# Patient Record
Sex: Male | Born: 1980 | Race: White | Hispanic: No | Marital: Single
Health system: Midwestern US, Community
[De-identification: ages and names within clinical notes are randomized; demographics above are authoritative.]

## PROBLEM LIST (undated history)

## (undated) DIAGNOSIS — Z87442 Personal history of urinary calculi: Secondary | ICD-10-CM

## (undated) DIAGNOSIS — I509 Heart failure, unspecified: Secondary | ICD-10-CM

## (undated) DIAGNOSIS — F1111 Opioid abuse, in remission: Secondary | ICD-10-CM

## (undated) DIAGNOSIS — N289 Disorder of kidney and ureter, unspecified: Secondary | ICD-10-CM

## (undated) DIAGNOSIS — F419 Anxiety disorder, unspecified: Secondary | ICD-10-CM

## (undated) DIAGNOSIS — Z205 Contact with and (suspected) exposure to viral hepatitis: Secondary | ICD-10-CM

## (undated) DIAGNOSIS — F32A Depression, unspecified: Secondary | ICD-10-CM

## (undated) DIAGNOSIS — F1910 Other psychoactive substance abuse, uncomplicated: Secondary | ICD-10-CM

## (undated) DIAGNOSIS — I10 Essential (primary) hypertension: Secondary | ICD-10-CM

## (undated) DIAGNOSIS — I639 Cerebral infarction, unspecified: Secondary | ICD-10-CM

## (undated) DIAGNOSIS — I219 Acute myocardial infarction, unspecified: Secondary | ICD-10-CM

## (undated) DIAGNOSIS — B1920 Unspecified viral hepatitis C without hepatic coma: Secondary | ICD-10-CM

## (undated) DIAGNOSIS — J45909 Unspecified asthma, uncomplicated: Secondary | ICD-10-CM

## (undated) HISTORY — PX: APPENDECTOMY: SHX54

## (undated) HISTORY — PX: EYE SURGERY: SHX253

## (undated) HISTORY — PX: TIBIA FRACTURE SURGERY: SHX806

---

## 2010-11-13 ENCOUNTER — Emergency Department (HOSPITAL_COMMUNITY): Payer: PRIVATE HEALTH INSURANCE

## 2010-11-13 ENCOUNTER — Encounter (HOSPITAL_COMMUNITY): Payer: Self-pay | Admitting: Radiology

## 2010-11-13 ENCOUNTER — Observation Stay (HOSPITAL_COMMUNITY)
Admission: EM | Admit: 2010-11-13 | Discharge: 2010-11-14 | Disposition: A | Discharge status: Home / Self Care | Payer: PRIVATE HEALTH INSURANCE | Attending: General Surgery | Admitting: General Surgery

## 2010-11-13 DIAGNOSIS — K3580 Unspecified acute appendicitis: Principal | ICD-10-CM | POA: Insufficient documentation

## 2010-11-13 DIAGNOSIS — R5381 Other malaise: Secondary | ICD-10-CM | POA: Insufficient documentation

## 2010-11-13 DIAGNOSIS — R109 Unspecified abdominal pain: Secondary | ICD-10-CM | POA: Insufficient documentation

## 2010-11-13 DIAGNOSIS — R110 Nausea: Secondary | ICD-10-CM | POA: Insufficient documentation

## 2010-11-13 DIAGNOSIS — R5383 Other fatigue: Secondary | ICD-10-CM | POA: Insufficient documentation

## 2010-11-13 LAB — URINALYSIS, ROUTINE W REFLEX MICROSCOPIC
Hgb urine dipstick: NEGATIVE
Nitrite: NEGATIVE
Specific Gravity, Urine: 1.01 (ref 1.005–1.030)
Urobilinogen, UA: 0.2 mg/dL (ref 0.0–1.0)

## 2010-11-13 LAB — BASIC METABOLIC PANEL
CO2: 27 mEq/L (ref 19–32)
Calcium: 9.6 mg/dL (ref 8.4–10.5)
GFR calc Af Amer: >60 mL/min (ref >60)
GFR calc non Af Amer: >60 mL/min (ref >60)
Sodium: 137 mEq/L (ref 135–145)

## 2010-11-13 LAB — CBC
HCT: 41.8 % (ref 39.0–52.0)
RDW: 13.7 % (ref 11.5–15.5)
WBC: 12.5 10*3/uL — ABNORMAL HIGH (ref 4.0–10.5)

## 2010-11-13 LAB — DIFFERENTIAL
Basophils Absolute: 0 10*3/uL (ref 0.0–0.1)
Eosinophils Relative: 1 % (ref 0–5)
Lymphocytes Relative: 16 % (ref 12–46)
Neutro Abs: 8.9 10*3/uL — ABNORMAL HIGH (ref 1.7–7.7)

## 2010-11-13 MED ORDER — IOHEXOL 300 MG/ML  SOLN
100.0000 mL | Freq: Once | INTRAMUSCULAR | Status: AC | PRN
  Administered 2010-11-13: 100 mL via INTRAVENOUS

## 2010-11-14 ENCOUNTER — Other Ambulatory Visit: Payer: Self-pay | Admitting: General Surgery

## 2010-11-15 LAB — CROSSMATCH
ABO/RH(D): A POS
Antibody Screen: NEGATIVE
Unit division: 0

## 2010-11-18 NOTE — Discharge Summary (Signed)
  NAMEGAMBLE, ENDERLE                ACCOUNT NO.:  [id number]  MEDICAL RECORD NO.:  [id number]           PATIENT TYPE:  I  LOCATION:  A337                          FACILITY:  [facility]  PHYSICIAN:  Dalia Heading, M.D.  DATE OF BIRTH:  [birth year]  DATE OF ADMISSION:  11/13/2010 DATE OF DISCHARGE:  02/08/2012LH                              DISCHARGE SUMMARY   HOSPITAL COURSE SUMMARY:  The patient is a 30 year old white male who presented from the corrections institution in [place] with right lower quadrant abdominal pain x24 hours.  CT scan of the abdomen and pelvis revealed acute appendicitis.  Surgery consultation was obtained, and the patient was taken to the operating room on November 14, 2010, and underwent laparoscopic appendectomy.  He tolerated the procedure well. His postoperative course was unremarkable.  His diet was advanced without difficulty.  The patient is being discharged back to the corrections institution in good and stable condition.  DISCHARGE INSTRUCTIONS:  The patient is to follow up with Dr. Franky Macho on November 20, 2010.  DISCHARGE MEDICATIONS:  Percocet 1 to 2 tablets p.o. q.4 h. p.r.n. pain.  PRINCIPAL DIAGNOSIS:  Acute appendicitis.  PRINCIPAL PROCEDURE:  Laparoscopic appendectomy on November 14, 2010.     Dalia Heading, M.D.     MAJ/MEDQ  D:  11/14/2010  T:  11/15/2010  Job:  [id number]  Electronically Signed by Franky Macho M.D. on 11/16/2010 01:24:49 PM

## 2010-11-18 NOTE — Op Note (Signed)
  NAMEOMERO, KOWAL                ACCOUNT NO.:  [id number]  MEDICAL RECORD NO.:  [id number]           PATIENT TYPE:  I  LOCATION:  A337                          FACILITY:  [facility]  PHYSICIAN:  Dalia Heading, M.D.  DATE OF BIRTH:  Feb 18, 1981  DATE OF PROCEDURE:  11/14/2010 DATE OF DISCHARGE:                              OPERATIVE REPORT   PREOPERATIVE DIAGNOSIS:  Acute appendicitis.  POSTOPERATIVE DIAGNOSIS:  Acute appendicitis.  PROCEDURE:  Laparoscopic appendectomy.  SURGEON:  Dalia Heading, MD  ANESTHESIA:  General endotracheal.  INDICATIONS:  The patient is a 30 year old incarcerated white male who is brought in by the [facility] Department for evaluation of right lower quadrant abdominal pain.  CT scan of the abdomen and pelvis reveals acute appendicitis.  The patient now comes to the operating room for laparoscopic appendectomy.  The risks and benefits of the procedure including bleeding, infection, and thepossibility of an open procedure were fully explained to the patient, gave informed consent.  PROCEDURE NOTE:  The patient was placed in supine position.  After induction of general endotracheal anesthesia, the abdomen was prepped and draped using the usual sterile technique with DuraPrep.  Surgical site confirmation was performed.  A supraumbilical incision was made down to the fascia.  A Veress needle was introduced into the abdominal cavity and confirmation of placement was done using the saline drop test.  The abdomen was then insufflated to 16 mmHg pressure.  An 11-mm trocar was introduced into the abdominal cavity under direct visualization without difficulty.  The patient was placed in deeper Trendelenburg position.  An additional 12-mm trocar was placed in the suprapubic region and a 5-mm trocar was placed in the left lower quadrant region.  The appendix was visualized and was noted to be inflamed along its distal two-thirds.  The  mesoappendix was divided using the harmonic scalpel.  A vascular Endo-GIA was placed across the base of the appendix and fired.  The appendix was then removed using an EndoCatch bag without difficulty.  It was sent to Pathology for further examination.  The staple line was inspected and noted to be within normal limits.  All fluid and air were then evacuated from the abdominal cavity prior to the removal of the trocars.  All wounds were irrigated with normal saline.  All wounds were injected with 0.5% Sensorcaine.  The supraumbilical fascia as well as suprapubic fascia were reapproximated using 0 Vicryl interrupted sutures.  All skin incisions were closed using staples.  Betadine ointment and dry sterile dressings were applied.  All tape and needle counts were correct at the end of the procedure. The patient was extubated in the operating room and went back to recovery room awake in stable condition.  COMPLICATIONS:  None.  SPECIMEN:  Appendix.  BLOOD LOSS:  Minimal.     Dalia Heading, M.D.     MAJ/MEDQ  D:  11/14/2010  T:  11/14/2010  Job:  [id number]  Electronically Signed by Franky Macho M.D. on 11/16/2010 01:24:45 PM

## 2012-05-11 ENCOUNTER — Encounter (HOSPITAL_COMMUNITY): Payer: Self-pay | Admitting: *Deleted

## 2012-05-11 ENCOUNTER — Emergency Department (HOSPITAL_COMMUNITY)
Admission: EM | Admit: 2012-05-11 | Discharge: 2012-05-11 | Disposition: A | Discharge status: Home / Self Care | Payer: Self-pay | Attending: Emergency Medicine | Admitting: Emergency Medicine

## 2012-05-11 DIAGNOSIS — Z87891 Personal history of nicotine dependence: Secondary | ICD-10-CM | POA: Insufficient documentation

## 2012-05-11 DIAGNOSIS — J019 Acute sinusitis, unspecified: Secondary | ICD-10-CM | POA: Insufficient documentation

## 2012-05-11 DIAGNOSIS — N289 Disorder of kidney and ureter, unspecified: Secondary | ICD-10-CM | POA: Insufficient documentation

## 2012-05-11 DIAGNOSIS — B1920 Unspecified viral hepatitis C without hepatic coma: Secondary | ICD-10-CM | POA: Insufficient documentation

## 2012-05-11 DIAGNOSIS — J3489 Other specified disorders of nose and nasal sinuses: Secondary | ICD-10-CM | POA: Insufficient documentation

## 2012-05-11 HISTORY — DX: Disorder of kidney and ureter, unspecified: N28.9

## 2012-05-11 HISTORY — DX: Unspecified viral hepatitis C without hepatic coma: B19.20

## 2012-05-11 MED ORDER — OXYMETAZOLINE HCL 0.05 % NA SOLN
1.0000 | Freq: Once | NASAL | Status: AC
  Administered 2012-05-11: 1 via NASAL
  Filled 2012-05-11: qty 15

## 2012-05-11 NOTE — ED Notes (Signed)
Pt c/o L nasal congestion/pain x 2 days and sore throat with difficulty swallowing x since this am.  C/o brown colored mucus.

## 2012-05-11 NOTE — ED Provider Notes (Signed)
History     CSN: [id number]  Arrival date & time 05/11/12  4098   First MD Initiated Contact with Patient 05/11/12 [phone]      Chief Complaint  Patient presents with  . Sore Throat  . Facial Pain    (Consider location/radiation/quality/duration/timing/severity/associated sxs/prior treatment) HPI Comments: Rual Vermeer 31 y.o. male   The chief complaint is: Patient presents with:   Sore Throat   Facial Pain   The patient has medical history significant for:   Past Medical History:   Hepatitis C                                                  Renal disorder                                                 Comment:kidney stones  Patient presents with a 2 day history of sinus congestion, brown-colored rhinorrhea, sore throat (6/10) and mild dysphagia that he noticed this morning.Patient states he also a non-productive cough, diffuse headache, and left nostril that is very painful. He has tried OTC alka seltser, Dayquil, and Nightquil without relief. He states that he has no had any symptoms like this before. Denies fever, chills, night sweats, Denies tinnitus or otalgia. Denies SOB, pleuritic pain. Denies difficulty handling oral secretions. Denies NVD. Denies sick contacts.      Patient is a 31 y.o. male presenting with pharyngitis. The history is provided by the patient.  Sore Throat Associated symptoms include congestion, coughing and a sore throat. Pertinent negatives include no abdominal pain, chills, fever, nausea or vomiting.    Past Medical History  Diagnosis Date  . Hepatitis C   . Renal disorder     kidney stones    Past Surgical History  Procedure Date  . Appendectomy     No family history on file.  History  Substance Use Topics  . Smoking status: Former Games developer  . Smokeless tobacco: Not on file  . Alcohol Use: No      Review of Systems  Constitutional: Negative for fever, chills and activity change.  HENT: Positive for congestion, sore throat,  rhinorrhea and sinus pressure. Negative for ear pain, nosebleeds and tinnitus.   Eyes:       Eye puffiness  Respiratory: Positive for cough. Negative for shortness of breath and wheezing.   Gastrointestinal: Negative for nausea, vomiting, abdominal pain and constipation.    Allergies  Red dye  Home Medications   Current Outpatient Rx  Name Route Sig Dispense Refill  . ALKA-SELTZER PLS SINUS & COUGH PO Oral Take 2 tablets by mouth every 8 (eight) hours as needed. For cold/sinus symptoms    . DAYQUIL PO Oral Take 2 tablets by mouth every 12 (twelve) hours as needed. for cold/sinus symptoms      BP 142/87  Pulse 93  Temp 97.9 F (36.6 C) (Oral)  Resp 18  Physical Exam  Nursing note and vitals reviewed. Constitutional: He appears well-developed and well-nourished. No distress.  HENT:  Head: Normocephalic and atraumatic. No trismus in the jaw.  Right Ear: Tympanic membrane and external ear normal.  Left Ear: Tympanic membrane, external ear and ear canal normal.  Nose: Mucosal edema,  rhinorrhea and sinus tenderness present. No nose lacerations, septal deviation or nasal septal hematoma. No epistaxis.  No foreign bodies. Right sinus exhibits no maxillary sinus tenderness and no frontal sinus tenderness. Left sinus exhibits no maxillary sinus tenderness and no frontal sinus tenderness.    Mouth/Throat: Mucous membranes are normal. He does not have dentures. No oral lesions. Normal dentition. No dental abscesses, uvula swelling, lacerations or dental caries. Posterior oropharyngeal erythema present. No oropharyngeal exudate, posterior oropharyngeal edema or tonsillar abscesses.       Oropharynx markedly erythematous.  Eyes: Conjunctivae and EOM are normal. Pupils are equal, round, and reactive to light. Right eye exhibits no discharge. Left eye exhibits no discharge. No scleral icterus.  Neck: Normal range of motion. Neck supple.  Cardiovascular: Normal rate, regular rhythm and normal  heart sounds.   Pulmonary/Chest: Effort normal and breath sounds normal. He has no wheezes.  Abdominal: Soft. Bowel sounds are normal. There is no tenderness.  Lymphadenopathy:    He has no cervical adenopathy.  Neurological: He is alert.  Skin: Skin is warm and dry.    ED Course  Procedures (including critical care time)   Labs Reviewed  RAPID STREP SCREEN   No results found. Results for orders placed during the hospital encounter of 05/11/12  RAPID STREP SCREEN      Component Value Range   Streptococcus, Group A Screen (Direct) NEGATIVE  NEGATIVE     1. Acute sinusitis       MDM  Patient presents with 2 day history of sore throat, rhinorhea, non-productive cough, headache, left sinus pressure, and mild dysphagia..Denies fever, chills, NS. Denies SOB or wheezing. Strep screen negative Patient unresponsive to OTC treatments. Afrin given in the ER with some improvement of symptoms. Patient has no red flags for Pneumonia or other serious etiology. Patient will take Afrin home and recommended to take Guaifenesin OTC for supportive care. Patient given return precautions verbally.        Pixie Casino, PA-C 05/11/12 [phone]

## 2012-05-14 NOTE — ED Provider Notes (Signed)
Medical screening examination/treatment/procedure(s) were performed by non-physician practitioner and as supervising physician I was immediately available for consultation/collaboration.   Gwyneth Sprout, MD 05/14/12 [phone]

## 2012-05-22 ENCOUNTER — Encounter (HOSPITAL_COMMUNITY): Payer: Self-pay | Admitting: Emergency Medicine

## 2012-05-22 ENCOUNTER — Emergency Department (INDEPENDENT_AMBULATORY_CARE_PROVIDER_SITE_OTHER)
Admission: EM | Admit: 2012-05-22 | Discharge: 2012-05-22 | Disposition: A | Discharge status: Home / Self Care | Payer: Self-pay | Source: Home / Self Care

## 2012-05-22 DIAGNOSIS — R21 Rash and other nonspecific skin eruption: Secondary | ICD-10-CM

## 2012-05-22 DIAGNOSIS — B353 Tinea pedis: Secondary | ICD-10-CM

## 2012-05-22 DIAGNOSIS — H109 Unspecified conjunctivitis: Secondary | ICD-10-CM

## 2012-05-22 MED ORDER — HYDROCORTISONE 1 % EX CREA: TOPICAL_CREAM | CUTANEOUS | Status: DC

## 2012-05-22 MED ORDER — TRIAMCINOLONE ACETONIDE 40 MG/ML IJ SUSP
INTRAMUSCULAR | Status: AC
  Filled 2012-05-22: qty 5

## 2012-05-22 MED ORDER — CLOTRIMAZOLE-BETAMETHASONE 1-0.05 % EX CREA: TOPICAL_CREAM | CUTANEOUS | Status: DC

## 2012-05-22 MED ORDER — TETRACAINE HCL 0.5 % OP SOLN
OPHTHALMIC | Status: AC
  Filled 2012-05-22: qty 2

## 2012-05-22 MED ORDER — POLYMYXIN B-TRIMETHOPRIM 10000-0.1 UNIT/ML-% OP SOLN: 1.0000 [drp] | OPHTHALMIC | Status: DC

## 2012-05-22 MED ORDER — TRIAMCINOLONE ACETONIDE 40 MG/ML IJ SUSP
60.0000 mg | Freq: Once | INTRAMUSCULAR | Status: AC
  Administered 2012-05-22: 60 mg via INTRAMUSCULAR

## 2012-05-22 NOTE — ED Provider Notes (Signed)
History     CSN: [id number]  Arrival date & time 05/22/12  1231   None     Chief Complaint  Patient presents with  . Foot Pain    (Consider location/radiation/quality/duration/timing/severity/associated sxs/prior treatment) Patient is a 31 y.o. male presenting with lower extremity pain. The history is provided by the patient.  Foot Pain  #1 Patient reports right eye redness for 4 days associated with blurred vision.  No known injury, no known foreign bodies.  Has used OTC allergy eye drops with no improvement.  Denies previous history of same.  Not diabetic, no previous eye surgery/trauma, denies cataracts or glaucoma. No floaters or flashing lights No vision loss + blurred vision + eye discomfort + eyelid itching No tearing No headache/scalp tenderness Does not wear contacts or glasses.   #2  This patient complains of a pruritic rash.  Location: face, neck and upper body  Onset: 2 wk ago   Course: unchanged Self-treated with: nothing             Improvement with treatment: n/a  History Itching: yes  Tenderness: no  New medications/antibiotics: no  Pet exposure: no  Recent travel or tropical exposure: no  New soaps, shampoos, detergent, clothing: no Tick/insect exposure: no   Red Flags Feeling ill: no Fever:no Facial/tongue swelling/difficulty breathing:  no  Diabetic or immunocompromised: no\   #3  Patient complains of left foot pain associated with lesion for two months.  States began as a small open area and has since gotten worse.  Applied OTC antifungal cream with no improvement in symptoms.  States pain is worse with walking.  Denies prior history of same.  Incarcerated one month ago, known history of Hep c, hx of IV drug use.  Currently staying at [facility] house.  Past Medical History  Diagnosis Date  . Hepatitis C   . Renal disorder     kidney stones    Past Surgical History  Procedure Date  . Appendectomy   . Eye surgery    secondary to dog bite    No family history on file.  History  Substance Use Topics  . Smoking status: Former Games developer  . Smokeless tobacco: Not on file  . Alcohol Use: No      Review of Systems  Constitutional: Negative.   HENT: Positive for sore throat and sneezing. Negative for hearing loss, ear pain, congestion, facial swelling, rhinorrhea, neck pain, neck stiffness, postnasal drip, sinus pressure and ear discharge.   Eyes: Positive for pain, redness, itching and visual disturbance. Negative for photophobia and discharge.  Respiratory: Positive for cough.   Cardiovascular: Negative.   Gastrointestinal: Negative.   Genitourinary: Negative.   Musculoskeletal: Negative.   Skin: Positive for rash.  Neurological: Negative.     Allergies  Red dye  Home Medications   Current Outpatient Rx  Name Route Sig Dispense Refill  . GUAIFENESIN ER 600 MG PO TB12 Oral Take 1,200 mg by mouth 2 (two) times daily.    Marland Kitchen LORATADINE 10 MG PO TABS Oral Take 10 mg by mouth daily.    Marland Kitchen OVER THE COUNTER MEDICATION  Allergy eye drops    . CLOTRIMAZOLE-BETAMETHASONE 1-0.05 % EX CREA  Apply to affected area 2 times daily prn 15 g 2  . ALKA-SELTZER PLS SINUS & COUGH PO Oral Take 2 tablets by mouth every 8 (eight) hours as needed. For cold/sinus symptoms    . HYDROCORTISONE 1 % EX CREA  Apply to affected area 2 times  daily 30 g 2  . DAYQUIL PO Oral Take 2 tablets by mouth every 12 (twelve) hours as needed. for cold/sinus symptoms    . POLYMYXIN B-TRIMETHOPRIM 10000-0.1 UNIT/ML-% OP SOLN Right Eye Place 1 drop into the right eye every 4 (four) hours. 10 mL 0    BP 122/77  Pulse 82  Temp 98.4 F (36.9 C) (Oral)  Resp 16  SpO2 97%  Physical Exam  Nursing note and vitals reviewed. Constitutional: He is oriented to person, place, and time. Vital signs are normal. He appears well-developed and well-nourished. He is active and cooperative.  HENT:  Head: Normocephalic.  Right Ear: Hearing, tympanic  membrane, external ear and ear canal normal.  Left Ear: Hearing, tympanic membrane, external ear and ear canal normal.  Nose: Nose normal. Right sinus exhibits no maxillary sinus tenderness and no frontal sinus tenderness. Left sinus exhibits no maxillary sinus tenderness and no frontal sinus tenderness.  Mouth/Throat: Uvula is midline, oropharynx is clear and moist and mucous membranes are normal.  Eyes: EOM and lids are normal. Pupils are equal, round, and reactive to light. Right eye exhibits no discharge and no exudate. Left eye exhibits no discharge and no exudate. Right conjunctiva is injected. Right conjunctiva has no hemorrhage. No scleral icterus.  Neck: Trachea normal and normal range of motion. Neck supple.  Cardiovascular: Normal rate, regular rhythm, normal heart sounds, intact distal pulses and normal pulses.   Pulmonary/Chest: Effort normal and breath sounds normal.  Lymphadenopathy:       Head (right side): No submental, no submandibular, no tonsillar, no preauricular, no posterior auricular and no occipital adenopathy present.       Head (left side): No submental, no submandibular, no tonsillar, no preauricular, no posterior auricular and no occipital adenopathy present.    He has no cervical adenopathy.    He has no axillary adenopathy.  Neurological: [name] to person, place, and time. He has normal strength. No cranial nerve deficit or sensory deficit. GCS eye subscore is 4. GCS verbal subscore is 5. GCS motor subscore is 6.  Skin: Skin is warm and dry. Rash noted.       Diffuse papular rash to face and neck, diffuse fine papular on anterior and posterior torso; scaly, erythematous rash to medial left foot approximately 5cm in diameter.  Psychiatric: He has a normal mood and affect. His speech is normal and behavior is normal. Judgment and thought content normal. Cognition and memory are normal.    ED Course  Procedures (including critical care time)  Labs  Reviewed - No data to display No results found.   1. Left conjunctivitis   2. Rash and nonspecific skin eruption   3. Tinea pedis       MDM  Kenalog 60mg  IM administered.  Polymyxin eye gtts as prescribed.  Cool showers; avoid heat, sunlight and anything that makes condition worse.  Consider switching to Zyrtec for better control of your allergy symptoms.  Begin hydrocortisone for facial rash and clotrimazole for foot infection.  RTC if symptoms do not improve or begin to have problems swallowing, breathing or significant change in condition.    Johnsie Kindred, NP 05/22/12 [phone]

## 2012-05-22 NOTE — ED Notes (Signed)
Multiple complaints.  Primary complaint is left foot pain.  Reports rash 2 months ago.  Now raw area, patient reports pus drainage, worsening . Used "tough actin tinactin" reports this made things worse per patient  Drainage on sock, amber color.  Patient Randall Parsons.  Patient has a red right eye.

## 2012-05-23 NOTE — ED Provider Notes (Signed)
Medical screening examination/treatment/procedure(s) were performed by resident physician or non-physician practitioner and as supervising physician I was immediately available for consultation/collaboration.   Barkley Bruns MD.    Linna Hoff, MD 05/23/12 [phone]

## 2012-05-25 ENCOUNTER — Emergency Department (HOSPITAL_COMMUNITY)
Admission: EM | Admit: 2012-05-25 | Discharge: 2012-05-25 | Disposition: A | Discharge status: Home / Self Care | Payer: Self-pay | Attending: Emergency Medicine | Admitting: Emergency Medicine

## 2012-05-25 ENCOUNTER — Encounter (HOSPITAL_COMMUNITY): Payer: Self-pay | Admitting: Emergency Medicine

## 2012-05-25 ENCOUNTER — Emergency Department (HOSPITAL_COMMUNITY): Payer: Self-pay

## 2012-05-25 DIAGNOSIS — B353 Tinea pedis: Secondary | ICD-10-CM | POA: Insufficient documentation

## 2012-05-25 DIAGNOSIS — Z87891 Personal history of nicotine dependence: Secondary | ICD-10-CM | POA: Insufficient documentation

## 2012-05-25 DIAGNOSIS — B1920 Unspecified viral hepatitis C without hepatic coma: Secondary | ICD-10-CM | POA: Insufficient documentation

## 2012-05-25 LAB — CBC WITH DIFFERENTIAL/PLATELET
Eosinophils Absolute: 0.3 10*3/uL (ref 0.0–0.7)
Eosinophils Relative: 3 % (ref 0–5)
HCT: 41.7 % (ref 39.0–52.0)
Lymphocytes Relative: 14 % (ref 12–46)
Lymphs Abs: 1.1 10*3/uL (ref 0.7–4.0)
MCH: 29.5 pg (ref 26.0–34.0)
MCV: 89.7 fL (ref 78.0–100.0)
Monocytes Absolute: 1 10*3/uL (ref 0.1–1.0)
Platelets: 356 10*3/uL (ref 150–400)
RBC: 4.65 MIL/uL (ref 4.22–5.81)
WBC: 8.3 10*3/uL (ref 4.0–10.5)

## 2012-05-25 MED ORDER — CEPHALEXIN 500 MG PO CAPS: 500.0000 mg | ORAL_CAPSULE | Freq: Four times a day (QID) | ORAL | Status: AC

## 2012-05-25 MED ORDER — CLOTRIMAZOLE 1 % EX CREA: TOPICAL_CREAM | CUTANEOUS | Status: DC

## 2012-05-25 NOTE — ED Notes (Signed)
Pt c/o left foot pain and wound with redness; pt sts went to [facility] and was told had athletes foot but not improving

## 2012-05-25 NOTE — ED Provider Notes (Signed)
History  This chart was scribed for Glynn Octave, MD by Shari Heritage. The patient was seen in room TR09C/TR09C. Patient's care was started at 1119.     CSN: [id number]  Arrival date & time 05/25/12  1119   First MD Initiated Contact with Patient 05/25/12 1235      Chief Complaint  Patient presents with  . Sore  . Foot Pain    The history is provided by the patient. No language interpreter was used.   Randall Parsons is a 31 y.o. male who presents to the Emergency Department complaining of anterior moderate, worsening, left foot pain with associated redness and swelling. Patient says that he has had these symptoms for  2 months ago, but they have worsened over the past few days. Patient denies fever. No nausea or vomiting. No chest pain or SOB. He was seen at Urgent care on 05/22/12 where he was diagnosed with athlete's foot. No imaging was done. He was discharged with instructions to take clotrimazole. He has a medical history of hepatitis C and renal disorder. His surgical history includes appendectomy and eye surgery.    Past Medical History  Diagnosis Date  . Hepatitis C   . Renal disorder     kidney stones    Past Surgical History  Procedure Date  . Appendectomy   . Eye surgery     secondary to dog bite    History  Substance Use Topics  . Smoking status: Former Games developer  . Smokeless tobacco: Not on file  . Alcohol Use: No      Review of Systems A complete 10 system review of systems was obtained and all systems are negative except as noted in the HPI and PMH.   Allergies  Red dye  Home Medications   Current Outpatient Rx  Name Route Sig Dispense Refill  . CLOTRIMAZOLE-BETAMETHASONE 1-0.05 % EX CREA Topical Apply 1 application topically 2 (two) times daily. For rash    . HYDROCORTISONE 1 % EX CREA Topical Apply 1 application topically 2 (two) times daily.    . IBUPROFEN 200 MG PO TABS Oral Take 600 mg by mouth every 6 (six) hours as needed. For pain    .  POLYMYXIN B-TRIMETHOPRIM 10000-0.1 UNIT/ML-% OP SOLN Right Eye Place 1 drop into the right eye every 4 (four) hours.    . CEPHALEXIN 500 MG PO CAPS Oral Take 1 capsule (500 mg total) by mouth 4 (four) times daily. 40 capsule 0  . CLOTRIMAZOLE 1 % EX CREA  Apply to affected area 2 times daily 15 g 0    BP 131/77  Pulse 88  Temp 98.1 F (36.7 C) (Oral)  Resp 20  SpO2 97%  Physical Exam  Constitutional: He is oriented to person, place, and time. He appears well-developed and well-nourished.  HENT:  Head: Normocephalic and atraumatic.  Cardiovascular: Normal rate and regular rhythm.   Pulses:      Dorsalis pedis pulses are 2+ on the right side, and 2+ on the left side.       Posterior tibial pulses are 2+ on the right side, and 2+ on the left side.  Pulmonary/Chest: Effort normal and breath sounds normal.  Musculoskeletal: Normal range of motion.  Neurological: [name] to person, place, and time.  Skin: There is erythema.       Left medial foot shows 4 cm of erythema and excoriation with serous drainage. No fluctuance. No induration. +2 DP and PT pulses. No rash  between toes.  Psychiatric: He has a normal mood and affect. His behavior is normal.    ED Course  Procedures (including critical care time) DIAGNOSTIC STUDIES: Oxygen Saturation is 97% on room air, adequate by my interpretation.    COORDINATION OF CARE: 12:52pm- Patient informed of current plan for treatment and evaluation and agrees with plan at this time.  Results for orders placed during the hospital encounter of 05/25/12  CBC WITH DIFFERENTIAL      Component Value Range   WBC 8.3  4.0 - 10.5 K/uL   RBC 4.65  4.22 - 5.81 MIL/uL   Hemoglobin 13.7  13.0 - 17.0 g/dL   HCT 98.1  19.1 - 47.8 %   MCV 89.7  78.0 - 100.0 fL   MCH 29.5  26.0 - 34.0 pg   MCHC 32.9  30.0 - 36.0 g/dL   RDW 29.5  62.1 - 30.8 %   Platelets 356  150 - 400 K/uL   Neutrophils Relative 71  43 - 77 %   Neutro Abs 5.9  1.7 - 7.7  K/uL   Lymphocytes Relative 14  12 - 46 %   Lymphs Abs 1.1  0.7 - 4.0 K/uL   Monocytes Relative 12  3 - 12 %   Monocytes Absolute 1.0  0.1 - 1.0 K/uL   Eosinophils Relative 3  0 - 5 %   Eosinophils Absolute 0.3  0.0 - 0.7 K/uL   Basophils Relative 1  0 - 1 %   Basophils Absolute 0.1  0.0 - 0.1 K/uL  SEDIMENTATION RATE      Component Value Range   Sed Rate 16  0 - 16 mm/hr    Dg Foot Complete Left  05/25/2012  *RADIOLOGY REPORT*  Clinical Data: Foot pain  LEFT FOOT - COMPLETE 3+ VIEW  Comparison: None.  Findings: Three views of the left foot submitted.  No acute fracture or subluxation.  No periosteal reaction or bony erosion.  IMPRESSION: No acute fracture or subluxation.   Original Report Authenticated By: Natasha Mead, M.D.      1. Tinea pedis       MDM  Erythematous scaly rash to left medial foot. Seen at urgent care given clotrimazole. No fevers, bleeding, discharge.  Possible tinea pedis. Patient erroneously has been using hydrocortisone instead of clotrimazole on the rash.  Will add keflex for possible superimposed infection. Followup with podiatry.     I personally performed the services described in this documentation, which was scribed in my presence.  The recorded information has been reviewed and considered.    Glynn Octave, MD 05/25/12 1505

## 2012-05-25 NOTE — ED Notes (Signed)
Patient transported to X-ray 

## 2012-05-25 NOTE — ED Notes (Signed)
C/o battling atheletes foot to left foot x 2 months. Reports since using OTC med (Tinactin) condition has gottn worse. Now red, draining & making ankle swell. Seen at [facility] for same few days ago & instructed to use hydrocortisone cream

## 2012-07-28 ENCOUNTER — Emergency Department (HOSPITAL_COMMUNITY)
Admission: EM | Admit: 2012-07-28 | Discharge: 2012-07-28 | Disposition: A | Discharge status: Home / Self Care | Payer: Self-pay | Attending: Emergency Medicine | Admitting: Emergency Medicine

## 2012-07-28 ENCOUNTER — Encounter (HOSPITAL_COMMUNITY): Payer: Self-pay

## 2012-07-28 DIAGNOSIS — Z87891 Personal history of nicotine dependence: Secondary | ICD-10-CM | POA: Insufficient documentation

## 2012-07-28 DIAGNOSIS — Z87442 Personal history of urinary calculi: Secondary | ICD-10-CM | POA: Insufficient documentation

## 2012-07-28 DIAGNOSIS — Z09 Encounter for follow-up examination after completed treatment for conditions other than malignant neoplasm: Secondary | ICD-10-CM

## 2012-07-28 DIAGNOSIS — Z8619 Personal history of other infectious and parasitic diseases: Secondary | ICD-10-CM | POA: Insufficient documentation

## 2012-07-28 DIAGNOSIS — R109 Unspecified abdominal pain: Secondary | ICD-10-CM | POA: Insufficient documentation

## 2012-07-28 LAB — CBC
HCT: 42 % (ref 39.0–52.0)
Hemoglobin: 14.1 g/dL (ref 13.0–17.0)
MCH: 29.6 pg (ref 26.0–34.0)
MCHC: 33.6 g/dL (ref 30.0–36.0)
MCV: 88.1 fL (ref 78.0–100.0)
RDW: 14.6 % (ref 11.5–15.5)

## 2012-07-28 LAB — COMPREHENSIVE METABOLIC PANEL
Albumin: 3.7 g/dL (ref 3.5–5.2)
Alkaline Phosphatase: 90 U/L (ref 39–117)
BUN: 17 mg/dL (ref 6–23)
Calcium: 9.2 mg/dL (ref 8.4–10.5)
Creatinine, Ser: 1.05 mg/dL (ref 0.50–1.35)
GFR calc Af Amer: >90 mL/min (ref >90)
Glucose, Bld: 92 mg/dL (ref 70–99)
Total Protein: 7.1 g/dL (ref 6.0–8.3)

## 2012-07-28 NOTE — ED Notes (Signed)
Pt getting undressed and into a gown at this time 

## 2012-07-28 NOTE — ED Provider Notes (Signed)
History     CSN: [id number]  Arrival date & time 07/28/12  1478   First MD Initiated Contact with Patient 07/28/12 [phone]      Chief Complaint  Patient presents with  . Abdominal Pain    (Consider location/radiation/quality/duration/timing/severity/associated sxs/prior treatment) HPI Comments: Patient is a 31 year old male with a history of IV opiates substance abuse and hepatitis C that presents emergency department complaining of abdominal distention for about one week.  he denies any significant abdominal pain, change in bowel movements, fever, night sweats, chills, emesis, chest pain, shortness of breath, difficulty breathing, easy bruising or change in bowel movements.   He is currently living in a drug free living community.   The history is provided by the patient.    Past Medical History  Diagnosis Date  . Hepatitis C   . Renal disorder     kidney stones    Past Surgical History  Procedure Date  . Appendectomy   . Eye surgery     secondary to dog bite    History reviewed. No pertinent family history.  History  Substance Use Topics  . Smoking status: Former Games developer  . Smokeless tobacco: Not on file  . Alcohol Use: No      Review of Systems  Constitutional: Negative for fever, chills and appetite change.  HENT: Negative for congestion.   Eyes: Negative for visual disturbance.  Respiratory: Negative for shortness of breath.   Cardiovascular: Negative for chest pain and leg swelling.  Gastrointestinal: Negative for abdominal pain.  Genitourinary: Negative for dysuria, urgency and frequency.  Neurological: Negative for dizziness, syncope, weakness, light-headedness, numbness and headaches.  Psychiatric/Behavioral: Negative for confusion.  All other systems reviewed and are negative.    Allergies  Other and Red dye  Home Medications   Current Outpatient Rx  Name Route Sig Dispense Refill  . CLOTRIMAZOLE 1 % EX CREA Topical Apply 1 application  topically 2 (two) times daily. Apply to affected area 2 times daily    . HYDROCORTISONE 1 % EX CREA Topical Apply 1 application topically 2 (two) times daily.    . IBUPROFEN 200 MG PO TABS Oral Take 600 mg by mouth every 6 (six) hours as needed. For pain    . ADULT MULTIVITAMIN W/MINERALS CH Oral Take 1 tablet by mouth daily.      BP 118/73  Pulse 65  Temp 97.5 F (36.4 C) (Oral)  Resp 18  SpO2 99%  Physical Exam  Nursing note and vitals reviewed. Constitutional: He is oriented to person, place, and time. He appears well-developed and well-nourished. No distress.  HENT:  Head: Normocephalic and atraumatic.  Eyes: Conjunctivae normal and EOM are normal.  Neck: Normal range of motion.  Cardiovascular:       Regular rate rhythm, no aberrancy and auscultation  Pulmonary/Chest: Effort normal.       Lungs clear to auscultation bilaterally  Abdominal:       Abdomen soft and nontender.  Bowel sounds present and normal.  No evidence of ascites including fluid wave.  No masses, guarding, or distention.   Musculoskeletal: Normal range of motion.  Neurological: [name] to person, place, and time.  Skin: Skin is warm and dry. No rash noted. He is not diaphoretic.  Psychiatric: He has a normal mood and affect. His behavior is normal.    ED Course  Procedures (including critical care time)  Labs Reviewed  COMPREHENSIVE METABOLIC PANEL - Abnormal; Notable for the following:  Total Bilirubin 0.2 (*)     All other components within normal limits  CBC   No results found.   No diagnosis found.    MDM  31 year old with a history of hepatitis C presented to emergency department concerned for what he believed to be abdominal distention.  On exam there is no distention seen.  Labs reviewed with no acute abnormalities.  Patient is with vital signs normal and in no acute distress prior to discharge. At this time there does not appear to be any evidence of an acute emergency  medical condition and the patient appears stable for discharge with appropriate outpatient follow up.Diagnosis was discussed with patient who verbalizes understanding and is agreeable to discharge.        Jaci Carrel, [place] 07/28/12 1153

## 2012-07-28 NOTE — ED Notes (Signed)
Pt ambulatory at discharge. Pt called a ride

## 2012-07-28 NOTE — ED Notes (Signed)
Pt presents to ED with abdominal distention x 1 week. Pt states had questionable Hepatitis and would like to get his enzymes checked. Mild abdominal pain.

## 2012-07-28 NOTE — ED Provider Notes (Signed)
Medical screening examination/treatment/procedure(s) were performed by non-physician practitioner and as supervising physician I was immediately available for consultation/collaboration.    Nelia Shi, MD 07/28/12 [phone]

## 2012-08-31 ENCOUNTER — Emergency Department (INDEPENDENT_AMBULATORY_CARE_PROVIDER_SITE_OTHER)
Admission: EM | Admit: 2012-08-31 | Discharge: 2012-08-31 | Disposition: A | Discharge status: Home / Self Care | Payer: Self-pay | Source: Home / Self Care

## 2012-08-31 DIAGNOSIS — J029 Acute pharyngitis, unspecified: Secondary | ICD-10-CM

## 2012-08-31 DIAGNOSIS — J111 Influenza due to unidentified influenza virus with other respiratory manifestations: Secondary | ICD-10-CM

## 2012-08-31 DIAGNOSIS — B353 Tinea pedis: Secondary | ICD-10-CM

## 2012-08-31 DIAGNOSIS — J069 Acute upper respiratory infection, unspecified: Secondary | ICD-10-CM

## 2012-08-31 MED ORDER — DEXTROMETHORPHAN POLISTIREX 30 MG/5ML PO LQCR: 60.0000 mg | Freq: Two times a day (BID) | ORAL | Status: DC | PRN

## 2012-08-31 MED ORDER — IBUPROFEN 800 MG PO TABS: 800.0000 mg | ORAL_TABLET | Freq: Three times a day (TID) | ORAL | Status: DC | PRN

## 2012-08-31 NOTE — ED Provider Notes (Signed)
History    CSN: [id number]  Arrival date & time 08/31/12  1134   Chief Complaint  Patient presents with  . Influenza    cough nasal blockage aches     The history is provided by the patient. No language interpreter was used.  Pt reports flu - like symptoms started 1.5 days ago with fever, no chills, body aches, malaise, no diarrhea, sore throat, dry hacking cough, nonproductive, no emesis, lack of appetite, fever has gotten better and pt is drinking and able to keep down fluids.    Past Medical History  Diagnosis Date  . Hepatitis C   . Renal disorder     kidney stones    Past Surgical History  Procedure Date  . Appendectomy   . Eye surgery     secondary to dog bite    No family history on file.  History  Substance Use Topics  . Smoking status: Former Games developer  . Smokeless tobacco: Not on file  . Alcohol Use: No    Review of Systems  Constitutional: Positive for fever, activity change, appetite change and fatigue. Negative for chills and unexpected weight change.  HENT: Positive for congestion and rhinorrhea. Negative for hearing loss, neck pain, neck stiffness and ear discharge.   Eyes: Negative for discharge and itching.  Respiratory: Negative.   Cardiovascular: Negative.   Gastrointestinal: Negative.   Genitourinary: Negative.   Musculoskeletal: Positive for myalgias. Negative for back pain, joint swelling, arthralgias and gait problem.  Neurological: Negative.   Hematological: Negative.   Psychiatric/Behavioral: Negative.     Allergies  Other and Red dye  Home Medications   Current Outpatient Rx  Name  Route  Sig  Dispense  Refill  . IBUPROFEN 200 MG PO TABS   Oral   Take 600 mg by mouth every 6 (six) hours as needed. For pain         . ADULT MULTIVITAMIN W/MINERALS CH   Oral   Take 1 tablet by mouth daily.         Marland Kitchen CLOTRIMAZOLE 1 % EX CREA   Topical   Apply 1 application topically 2 (two) times daily. Apply to affected area 2 times daily        . HYDROCORTISONE 1 % EX CREA   Topical   Apply 1 application topically 2 (two) times daily.           BP 111/80  Pulse 88  Temp 98.4 F (36.9 C) (Oral)  Resp 19  SpO2 98%  Physical Exam  Constitutional: He is oriented to person, place, and time. He appears well-developed and well-nourished. No distress.  HENT:  Head: Normocephalic and atraumatic.  Mouth/Throat: No oropharyngeal exudate.       Posterior pharynx erythematous  Eyes: Conjunctivae normal are normal. Pupils are equal, round, and reactive to light. Left eye exhibits no discharge. No scleral icterus.  Neck: Normal range of motion. Neck supple. No JVD present. No thyromegaly present.  Cardiovascular: Normal rate, regular rhythm and normal heart sounds.   Pulmonary/Chest: Effort normal and breath sounds normal. No respiratory distress. He has no wheezes. He has no rales.  Abdominal: Soft. Bowel sounds are normal. He exhibits no distension and no mass. There is no tenderness. There is no rebound and no guarding.  Musculoskeletal: He exhibits no edema and no tenderness.  Lymphadenopathy:    He has no cervical adenopathy.  Neurological: [name] to person, place, and time.  Skin: Skin is warm and  dry.  Psychiatric: He has a normal mood and affect. His behavior is normal. Judgment and thought content normal.    ED Course  Procedures (including critical care time)  Labs Reviewed - No data to display No results found.   No diagnosis found.   MDM   IMPRESSION  Upper respiratory infection  Suspected influenza  Mild clinical dehydration  Resolving tinea pedis  RECOMMENDATIONS / PLAN  Prescribed ibuprofen 800 mg 3 times a day when necessary body aches and pains, encourage fluids, rest, return to clinic or go to ER if symptoms worsen or don't improve.  FOLLOW UP As needed for regular health care  The patient was given clear instructions to go to ER or return to medical center if  symptoms don't improve, worsen or new problems develop.  The patient verbalized understanding.  The patient was told to call to get lab results if they haven't heard anything in the next week.            Cleora Fleet, MD 08/31/12 1550

## 2012-08-31 NOTE — ED Notes (Signed)
Patient c/o flu like symptoms  Fever x1 day cough congestion nasal blockage aches and generalized pain. Chills feels fatigued

## 2012-09-17 ENCOUNTER — Encounter (HOSPITAL_COMMUNITY): Payer: Self-pay | Admitting: Emergency Medicine

## 2012-09-17 ENCOUNTER — Emergency Department (HOSPITAL_COMMUNITY)
Admission: EM | Admit: 2012-09-17 | Discharge: 2012-09-17 | Disposition: A | Discharge status: Home / Self Care | Payer: Self-pay | Attending: Emergency Medicine | Admitting: Emergency Medicine

## 2012-09-17 DIAGNOSIS — Z87891 Personal history of nicotine dependence: Secondary | ICD-10-CM | POA: Insufficient documentation

## 2012-09-17 DIAGNOSIS — Z87442 Personal history of urinary calculi: Secondary | ICD-10-CM | POA: Insufficient documentation

## 2012-09-17 DIAGNOSIS — K0889 Other specified disorders of teeth and supporting structures: Secondary | ICD-10-CM

## 2012-09-17 DIAGNOSIS — K089 Disorder of teeth and supporting structures, unspecified: Secondary | ICD-10-CM | POA: Insufficient documentation

## 2012-09-17 DIAGNOSIS — Z8619 Personal history of other infectious and parasitic diseases: Secondary | ICD-10-CM | POA: Insufficient documentation

## 2012-09-17 MED ORDER — OXYCODONE-ACETAMINOPHEN 5-325 MG PO TABS: 1.0000 | ORAL_TABLET | Freq: Once | ORAL | Status: DC

## 2012-09-17 MED ORDER — HYDROCODONE-IBUPROFEN 7.5-200 MG PO TABS: 1.0000 | ORAL_TABLET | Freq: Four times a day (QID) | ORAL | Status: DC | PRN

## 2012-09-17 MED ORDER — PENICILLIN V POTASSIUM 500 MG PO TABS: 500.0000 mg | ORAL_TABLET | Freq: Three times a day (TID) | ORAL | Status: DC

## 2012-09-17 MED ORDER — PENICILLIN V POTASSIUM 250 MG PO TABS
500.0000 mg | ORAL_TABLET | Freq: Once | ORAL | Status: AC
  Administered 2012-09-17: 500 mg via ORAL
  Filled 2012-09-17: qty 2

## 2012-09-17 MED ORDER — PENICILLIN V POTASSIUM 250 MG PO TABS: 500.0000 mg | ORAL_TABLET | Freq: Once | ORAL | Status: DC

## 2012-09-17 MED ORDER — OXYCODONE-ACETAMINOPHEN 5-325 MG PO TABS
1.0000 | ORAL_TABLET | Freq: Once | ORAL | Status: AC
  Administered 2012-09-17: 1 via ORAL
  Filled 2012-09-17: qty 1

## 2012-09-17 NOTE — ED Provider Notes (Signed)
History     CSN: [id number]  Arrival date & time 09/17/12  2137   First MD Initiated Contact with Patient 09/17/12 2202      Chief Complaint  Patient presents with  . Dental Pain    (Consider location/radiation/quality/duration/timing/severity/associated sxs/prior treatment) HPI  31 year old male presents complaining of dental pain. Patient reports gradual onset of pain to his right lower wisdom tooth which started this evening. Patient described pain as a sharp and throbbing sensation radiates to his jaw, moderate in severity, worsened with chewing or with cold air. Pain is equivalent to a dental abscess that had been the past from a different tooth. He denies fever, chills, rash, recent trauma, throat swelling, or trouble swallowing. No treatment tried.  Past Medical History  Diagnosis Date  . Hepatitis C   . Renal disorder     kidney stones    Past Surgical History  Procedure Date  . Appendectomy   . Eye surgery     secondary to dog bite    History reviewed. No pertinent family history.  History  Substance Use Topics  . Smoking status: Former Games developer  . Smokeless tobacco: Not on file  . Alcohol Use: No      Review of Systems  Constitutional: Negative for fever.  HENT: Positive for dental problem. Negative for sore throat and trouble swallowing.   Skin: Negative for rash.  Neurological: Negative for numbness.    Allergies  Other and Red dye  Home Medications  No current outpatient prescriptions on file.  BP 137/85  Pulse 73  Temp 98 F (36.7 C) (Oral)  Resp 18  SpO2 98%  Physical Exam  Nursing note and vitals reviewed. Constitutional: He appears well-developed and well-nourished. He appears distressed (uncomfortable appearing, holding right side of face).  HENT:  Head: Normocephalic and atraumatic.  Right Ear: External ear normal.  Left Ear: External ear normal.  Mouth/Throat: Oropharynx is clear and moist.         No TMJ. No trismus  Eyes:  Conjunctivae normal are normal.  Neck: Normal range of motion. Neck supple.  Lymphadenopathy:    He has no cervical adenopathy.  Neurological: He is alert.  Skin: Skin is warm. No rash noted.  Psychiatric: He has a normal mood and affect.    ED Course  Procedures (including critical care time)  Labs Reviewed - No data to display No results found.   No diagnosis found.  1. Dental pain  MDM  Pain to R lower molar.  No abscess or evidence of deep tissue infection.  Will treat with pen VK and vicodin.  Dental referral given.    BP 137/85  Pulse 73  Temp 98 F (36.7 C) (Oral)  Resp 18  SpO2 98%         Fayrene Helper, PA-C 09/17/12 2229

## 2012-09-17 NOTE — ED Provider Notes (Signed)
Medical screening examination/treatment/procedure(s) were performed by non-physician practitioner and as supervising physician I was immediately available for consultation/collaboration.  Tatiana Courter, MD 09/17/12 2343 

## 2012-09-17 NOTE — ED Notes (Signed)
Patient complaining of right sided dental pain that started this evening; reports that he has an abscess on the right upper portion of his mouth.  Patient reports that he has had a dental abscess in the past; reports that he has recently had dental work done on December 5th (mostly for dental carries).

## 2012-10-12 ENCOUNTER — Encounter (HOSPITAL_COMMUNITY): Payer: Self-pay | Admitting: *Deleted

## 2012-10-12 ENCOUNTER — Emergency Department (HOSPITAL_COMMUNITY): Payer: Self-pay

## 2012-10-12 ENCOUNTER — Emergency Department (HOSPITAL_COMMUNITY)
Admission: EM | Admit: 2012-10-12 | Discharge: 2012-10-12 | Disposition: A | Discharge status: Home Health | Payer: Self-pay | Attending: Emergency Medicine | Admitting: Emergency Medicine

## 2012-10-12 DIAGNOSIS — R509 Fever, unspecified: Secondary | ICD-10-CM | POA: Insufficient documentation

## 2012-10-12 DIAGNOSIS — R110 Nausea: Secondary | ICD-10-CM | POA: Insufficient documentation

## 2012-10-12 DIAGNOSIS — B9789 Other viral agents as the cause of diseases classified elsewhere: Secondary | ICD-10-CM | POA: Insufficient documentation

## 2012-10-12 DIAGNOSIS — Z8619 Personal history of other infectious and parasitic diseases: Secondary | ICD-10-CM | POA: Insufficient documentation

## 2012-10-12 DIAGNOSIS — R059 Cough, unspecified: Secondary | ICD-10-CM | POA: Insufficient documentation

## 2012-10-12 DIAGNOSIS — Z87448 Personal history of other diseases of urinary system: Secondary | ICD-10-CM | POA: Insufficient documentation

## 2012-10-12 DIAGNOSIS — B349 Viral infection, unspecified: Secondary | ICD-10-CM

## 2012-10-12 DIAGNOSIS — M2550 Pain in unspecified joint: Secondary | ICD-10-CM | POA: Insufficient documentation

## 2012-10-12 DIAGNOSIS — Z87891 Personal history of nicotine dependence: Secondary | ICD-10-CM | POA: Insufficient documentation

## 2012-10-12 DIAGNOSIS — R51 Headache: Secondary | ICD-10-CM | POA: Insufficient documentation

## 2012-10-12 DIAGNOSIS — J111 Influenza due to unidentified influenza virus with other respiratory manifestations: Secondary | ICD-10-CM

## 2012-10-12 DIAGNOSIS — J3489 Other specified disorders of nose and nasal sinuses: Secondary | ICD-10-CM | POA: Insufficient documentation

## 2012-10-12 DIAGNOSIS — R05 Cough: Secondary | ICD-10-CM | POA: Insufficient documentation

## 2012-10-12 MED ORDER — IBUPROFEN 800 MG PO TABS
800.0000 mg | ORAL_TABLET | Freq: Once | ORAL | Status: AC
  Administered 2012-10-12: 800 mg via ORAL
  Filled 2012-10-12: qty 1

## 2012-10-12 MED ORDER — OSELTAMIVIR PHOSPHATE 75 MG PO CAPS: 75.0000 mg | ORAL_CAPSULE | Freq: Two times a day (BID) | ORAL | Status: DC

## 2012-10-12 MED ORDER — ONDANSETRON HCL 4 MG PO TABS: 4.0000 mg | ORAL_TABLET | Freq: Four times a day (QID) | ORAL | Status: DC

## 2012-10-12 MED ORDER — ONDANSETRON 4 MG PO TBDP
8.0000 mg | ORAL_TABLET | Freq: Once | ORAL | Status: AC
  Administered 2012-10-12: 8 mg via ORAL
  Filled 2012-10-12: qty 2

## 2012-10-12 NOTE — ED Notes (Signed)
Patient c/o cold/flu like symptoms x 1 day, patient with productive cough, fever

## 2012-10-12 NOTE — ED Provider Notes (Signed)
History     CSN: [id number]  Arrival date & time 10/12/12  9528   First MD Initiated Contact with Patient 10/12/12 [phone]      Chief Complaint  Patient presents with  . URI    (Consider location/radiation/quality/duration/timing/severity/associated sxs/prior treatment) Patient is a 32 y.o. male presenting with URI. The history is provided by the patient. No language interpreter was used.  URI The primary symptoms include fever, headaches, cough, nausea and arthralgias. Primary symptoms do not include fatigue, ear pain, sore throat, wheezing, abdominal pain or vomiting. The current episode started yesterday.  Symptoms associated with the illness include chills and rhinorrhea. The illness is not associated with sinus pressure or congestion.   33 year old male with past medical history hep C, IV drug abuse coming in with fever cough and nausea since yesterday. Patient has had no vomiting and no diarrhea. States that he has body aches and pains as well.   States that his hair huts as well.  Did not have the flu shot. Patient is staying at the [facility] house. VSS tolerating po's.    Past Medical History  Diagnosis Date  . Hepatitis C   . Renal disorder     kidney stones    Past Surgical History  Procedure Date  . Appendectomy   . Eye surgery     secondary to dog bite    No family history on file.  History  Substance Use Topics  . Smoking status: Former Games developer  . Smokeless tobacco: Not on file  . Alcohol Use: No      Review of Systems  Constitutional: Positive for fever and chills. Negative for fatigue.  HENT: Positive for rhinorrhea. Negative for ear pain, congestion, sore throat, sneezing, neck pain and sinus pressure.   Eyes: Negative.   Respiratory: Positive for cough. Negative for shortness of breath and wheezing.   Cardiovascular: Negative.   Gastrointestinal: Positive for nausea. Negative for vomiting, abdominal pain, diarrhea and blood in stool.  Musculoskeletal:  Positive for arthralgias.  Neurological: Positive for headaches.  Psychiatric/Behavioral: Negative.   All other systems reviewed and are negative.    Allergies  Other and Red dye  Home Medications   Current Outpatient Rx  Name  Route  Sig  Dispense  Refill  . TYLENOL COLD RELIEF PO   Oral   Take 30 mLs by mouth every 8 (eight) hours as needed. For cold symptoms         . ADULT MULTIVITAMIN W/MINERALS CH   Oral   Take 1 tablet by mouth daily.         Marland Kitchen PSEUDOEPHEDRINE HCL 30 MG PO TABS   Oral   Take 30 mg by mouth every 4 (four) hours as needed. For congestion           BP 130/82  Temp 101.1 F (38.4 C) (Oral)  Resp 24  SpO2 100%  Physical Exam  Nursing note and vitals reviewed. Constitutional: He is oriented to person, place, and time. He appears well-developed and well-nourished.  HENT:  Head: Normocephalic.  Eyes: Conjunctivae normal and EOM are normal. Pupils are equal, round, and reactive to light.  Neck: Normal range of motion. Neck supple.  Cardiovascular: Normal rate.   Pulmonary/Chest: Effort normal and breath sounds normal. No respiratory distress. He has no wheezes. He has no rales.  Abdominal: Soft. Bowel sounds are normal. He exhibits no distension. There is no tenderness.  Musculoskeletal: Normal range of motion. He exhibits tenderness.  Neurological: He is  alert and oriented to person, place, and time.  Skin: Skin is warm and dry.  Psychiatric: He has a normal mood and affect.    ED Course  Procedures (including critical care time)  Labs Reviewed - No data to display Dg Chest 2 View  10/12/2012  *RADIOLOGY REPORT*  Clinical Data: Cough.  CHEST - 2 VIEW  Comparison: 11/13/2010  Findings: Heart and mediastinal contours are within normal limits. No focal opacities or effusions.  No acute bony abnormality.  IMPRESSION: No active cardiopulmonary disease.   Original Report Authenticated By: Charlett Nose, M.D.      No diagnosis found.    MDM    Flulike symptoms for 24 hours. Chest x-ray reviewed by myself.shows no pneumonia. We'll treat with Motrin. tamiflu and zofran and follow up with pcp of choice.  Understands to return for worsening symptoms.           Remi Haggard, NP 10/13/12 1013

## 2012-10-14 NOTE — ED Provider Notes (Signed)
Medical screening examination/treatment/procedure(s) were performed by non-physician practitioner and as supervising physician I was immediately available for consultation/collaboration.   Gavin Pound. Tevon Berhane, MD 10/14/12 1607

## 2012-11-30 ENCOUNTER — Encounter (HOSPITAL_COMMUNITY): Payer: Self-pay | Admitting: *Deleted

## 2012-11-30 ENCOUNTER — Emergency Department (HOSPITAL_COMMUNITY)
Admission: EM | Admit: 2012-11-30 | Discharge: 2012-11-30 | Disposition: A | Discharge status: Home / Self Care | Payer: Self-pay | Attending: Emergency Medicine | Admitting: Emergency Medicine

## 2012-11-30 DIAGNOSIS — Z87891 Personal history of nicotine dependence: Secondary | ICD-10-CM | POA: Insufficient documentation

## 2012-11-30 DIAGNOSIS — Z87442 Personal history of urinary calculi: Secondary | ICD-10-CM | POA: Insufficient documentation

## 2012-11-30 DIAGNOSIS — K044 Acute apical periodontitis of pulpal origin: Secondary | ICD-10-CM | POA: Insufficient documentation

## 2012-11-30 DIAGNOSIS — K047 Periapical abscess without sinus: Secondary | ICD-10-CM

## 2012-11-30 DIAGNOSIS — K089 Disorder of teeth and supporting structures, unspecified: Secondary | ICD-10-CM | POA: Insufficient documentation

## 2012-11-30 DIAGNOSIS — Z8619 Personal history of other infectious and parasitic diseases: Secondary | ICD-10-CM | POA: Insufficient documentation

## 2012-11-30 DIAGNOSIS — K0889 Other specified disorders of teeth and supporting structures: Secondary | ICD-10-CM

## 2012-11-30 DIAGNOSIS — Z79899 Other long term (current) drug therapy: Secondary | ICD-10-CM | POA: Insufficient documentation

## 2012-11-30 MED ORDER — PENICILLIN V POTASSIUM 500 MG PO TABS: 500.0000 mg | ORAL_TABLET | Freq: Four times a day (QID) | ORAL | Status: AC

## 2012-11-30 MED ORDER — HYDROCODONE-ACETAMINOPHEN 5-325 MG PO TABS: 1.0000 | ORAL_TABLET | ORAL | Status: DC | PRN

## 2012-11-30 MED ORDER — BUPIVACAINE HCL (PF) 0.5 % IJ SOLN
30.0000 mL | Freq: Once | INTRAMUSCULAR | Status: AC
  Administered 2012-11-30: 30 mL

## 2012-11-30 NOTE — ED Notes (Signed)
Patient says the bottom, left back tooth has been hurting him since this morning.  Patient says he has an appointment to the dentist on March 6th for some follow up work.  Patient was going to wait and be seen then for this tooth, but his pain is so severe he cannot wait that long so he came to the ED.

## 2012-11-30 NOTE — ED Notes (Signed)
Pt has left lower tooth abscess and nothing is touching the pain

## 2012-11-30 NOTE — ED Provider Notes (Signed)
History    This chart was scribed for Randall Co, MD by Gerlean Ren, ED Scribe. This patient was seen in room TR04C/TR04C and the patient's care was started at 6:46 PM    CSN: [id number]  Arrival date & time 11/30/12  1719   First MD Initiated Contact with Patient 11/30/12 1825      Chief Complaint  Patient presents with  . Dental Pain     The history is provided by the patient. No language interpreter was used.  Randall Parsons is a 32 y.o. male who presents to the Emergency Department complaining of constant, sharp lower right side dental pain with sudden onset at 5:00 AM today that woke pt up.  Pt has contacted dentist and has dental appointment 03/06.  Pt denies fever.  Pt has h/o prior dental decay to the affected tooth.  Past Medical History  Diagnosis Date  . Hepatitis C   . Renal disorder     kidney stones    Past Surgical History  Procedure Laterality Date  . Appendectomy    . Eye surgery      secondary to dog bite    No family history on file.  History  Substance Use Topics  . Smoking status: Former Games developer  . Smokeless tobacco: Not on file  . Alcohol Use: No      Review of Systems A complete 10 system review of systems was obtained and all systems are negative except as noted in the HPI and PMH.   Allergies  Other and Red dye  Home Medications   Current Outpatient Rx  Name  Route  Sig  Dispense  Refill  . Diphenhydramine-Acetaminophen (TYLENOL COLD RELIEF PO)   Oral   Take 30 mLs by mouth every 8 (eight) hours as needed. For cold symptoms         . Multiple Vitamin (MULTIVITAMIN WITH MINERALS) TABS   Oral   Take 1 tablet by mouth daily.         . ondansetron (ZOFRAN) 4 MG tablet   Oral   Take 1 tablet (4 mg total) by mouth every 6 (six) hours.   12 tablet   0   . oseltamivir (TAMIFLU) 75 MG capsule   Oral   Take 1 capsule (75 mg total) by mouth every 12 (twelve) hours.   10 capsule   0   . pseudoephedrine (SUDAFED) 30 MG tablet    Oral   Take 30 mg by mouth every 4 (four) hours as needed. For congestion           BP 135/75  Pulse 78  Temp(Src) 99.1 F (37.3 C) (Oral)  Resp 18  SpO2 99%  Physical Exam  Nursing note and vitals reviewed. Constitutional: He is oriented to person, place, and time. He appears well-developed and well-nourished.  HENT:  Head: Normocephalic.  Left lower first molar with mild dental tenderness, no gingival fluctuance, no facial swelling  Eyes: EOM are normal.  Neck: Normal range of motion.  No lymphadenopathy  Pulmonary/Chest: Effort normal.  Abdominal: He exhibits no distension.  Musculoskeletal: Normal range of motion.  Neurological: [name] to person, place, and time.  Psychiatric: He has a normal mood and affect.    ED Course  Dental Date/Time: 11/30/2012 7:07 PM Performed by: Randall Parsons Authorized by: Randall Parsons Consent: Verbal consent obtained. Consent given by: patient Required items: required blood products, implants, devices, and special equipment available Patient identity confirmed: verbally  with patient Patient sedated: no Patient tolerance: Patient tolerated the procedure well with no immediate complications. Comments: Dental block with 0.5% bupivacaine.  2 cc.  Injected to the lateral aspect of the affected tooth.   (including critical care time)    DIAGNOSTIC STUDIES: Oxygen Saturation is 99% on room air, normal by my interpretation.    COORDINATION OF CARE: 6:47 PM- Informed pt of clinical course to numb affected tooth here.  Pt understands and agrees with plan.   1. Pain, dental   2. Dental infection       MDM  Dental Pain. Home with antibiotics and pain medicine. Recommend dental follow up. No signs of gingival abscess. Tolerating secretions. Airway patent. No sub lingular swelling   I personally performed the services described in this documentation, which was scribed in my presence. The recorded information has  been reviewed and is accurate.          Randall Co, MD 11/30/12 Windell Moment

## 2012-12-15 ENCOUNTER — Emergency Department (HOSPITAL_COMMUNITY)
Admission: EM | Admit: 2012-12-15 | Discharge: 2012-12-15 | Disposition: A | Discharge status: Home / Self Care | Payer: Self-pay | Attending: Emergency Medicine | Admitting: Emergency Medicine

## 2012-12-15 ENCOUNTER — Encounter (HOSPITAL_COMMUNITY): Payer: Self-pay | Admitting: Physical Medicine and Rehabilitation

## 2012-12-15 DIAGNOSIS — Z79899 Other long term (current) drug therapy: Secondary | ICD-10-CM | POA: Insufficient documentation

## 2012-12-15 DIAGNOSIS — Z87442 Personal history of urinary calculi: Secondary | ICD-10-CM | POA: Insufficient documentation

## 2012-12-15 DIAGNOSIS — Z87891 Personal history of nicotine dependence: Secondary | ICD-10-CM | POA: Insufficient documentation

## 2012-12-15 DIAGNOSIS — K089 Disorder of teeth and supporting structures, unspecified: Secondary | ICD-10-CM | POA: Insufficient documentation

## 2012-12-15 DIAGNOSIS — Z8619 Personal history of other infectious and parasitic diseases: Secondary | ICD-10-CM | POA: Insufficient documentation

## 2012-12-15 DIAGNOSIS — K1370 Unspecified lesions of oral mucosa: Secondary | ICD-10-CM | POA: Insufficient documentation

## 2012-12-15 DIAGNOSIS — K029 Dental caries, unspecified: Secondary | ICD-10-CM | POA: Insufficient documentation

## 2012-12-15 MED ORDER — PENICILLIN V POTASSIUM 500 MG PO TABS: 500.0000 mg | ORAL_TABLET | Freq: Four times a day (QID) | ORAL | Status: AC

## 2012-12-15 MED ORDER — OXYCODONE-ACETAMINOPHEN 5-325 MG PO TABS
1.0000 | ORAL_TABLET | Freq: Once | ORAL | Status: AC
  Administered 2012-12-15: 1 via ORAL
  Filled 2012-12-15: qty 1

## 2012-12-15 MED ORDER — OXYCODONE-ACETAMINOPHEN 5-325 MG PO TABS: 1.0000 | ORAL_TABLET | Freq: Four times a day (QID) | ORAL | Status: DC | PRN

## 2012-12-15 NOTE — ED Notes (Signed)
Pt presents to department for evaluation of L lower molar pain. Ongoing for several days. 10/10 pain at the time. States he needs root canal, but can't afford treatment. Pt is alert and oriented x4.

## 2012-12-15 NOTE — ED Provider Notes (Signed)
History  This chart was scribed for non-physician practitioner working with Raeford Razor, MD by Ardeen Jourdain, ED Scribe. This patient was seen in room TR11C/TR11C and the patient's care was started at 1804.  CSN: [id number]  Arrival date & time 12/15/12  1548   None     Chief Complaint  Patient presents with  . Dental Pain     Patient is a 32 y.o. male presenting with tooth pain. The history is provided by the patient. No language interpreter was used.  Dental PainThe primary symptoms include mouth pain. Primary symptoms do not include fever, shortness of breath, sore throat, angioedema or cough. The symptoms began 3 to 5 days ago. The symptoms are worsening. The symptoms are recurrent. The symptoms occur constantly.  Mouth pain began 5 - 7 days ago. Mouth pain occurs constantly. Mouth pain is unchanged. Affected locations include: gum(s) and teeth. At its highest the mouth pain was at 10/10. The mouth pain is currently at 10/10.   Additional symptoms include: gum tenderness. Additional symptoms do not include: dental sensitivity to temperature, trismus, jaw pain, facial swelling, trouble swallowing, pain with swallowing, drooling, swollen glands and goiter.    Randall Parsons is a 33 y.o. male who presents to the Emergency Department complaining of constant, unchanging, gradual onset left lower molar pain that began several days ago. He states he thinks he has an abscess. He reports he has been seen for these symptoms before. He states he has followed up with the dentist after his previous visits. He states he needs a root canal but is unable to afford the procedure. He denies any fever, nausea, emesis, trouble swallowing or SOB as associated symptoms.    Past Medical History  Diagnosis Date  . Hepatitis C   . Renal disorder     kidney stones    Past Surgical History  Procedure Laterality Date  . Appendectomy    . Eye surgery      secondary to dog bite    History reviewed. No  pertinent family history.  History  Substance Use Topics  . Smoking status: Former Games developer  . Smokeless tobacco: Not on file  . Alcohol Use: No      Review of Systems  Constitutional: Negative for fever and chills.  HENT: Positive for dental problem. Negative for sore throat, facial swelling, drooling and trouble swallowing.   Respiratory: Negative for cough and shortness of breath.   Gastrointestinal: Negative for nausea and vomiting.  Neurological: Negative for weakness.  All other systems reviewed and are negative.    Allergies  Other and Red dye  Home Medications   Current Outpatient Rx  Name  Route  Sig  Dispense  Refill  . Multiple Vitamin (MULTIVITAMIN WITH MINERALS) TABS   Oral   Take 1 tablet by mouth daily.           Triage Vitals: BP 121/84  Pulse 79  Temp(Src) 98.1 F (36.7 C) (Oral)  Resp 20  SpO2 98%  Physical Exam  Nursing note and vitals reviewed. Constitutional: He is oriented to person, place, and time. He appears well-developed and well-nourished. No distress.  HENT:  Head: Normocephalic and atraumatic.  Mouth/Throat: Uvula is midline and mucous membranes are normal.    Wide spread dental decay  Eyes: EOM are normal. Pupils are equal, round, and reactive to light.  Neck: Normal range of motion. Neck supple. No tracheal deviation present.  Cardiovascular: Normal rate, regular rhythm and normal heart sounds.  Exam reveals  no gallop and no friction rub.   No murmur heard. Pulmonary/Chest: Effort normal and breath sounds normal. No respiratory distress. He has no wheezes. He has no rales. He exhibits no tenderness.  Abdominal: Soft. He exhibits no distension.  Musculoskeletal: Normal range of motion. He exhibits no edema.  Neurological: [name] to person, place, and time.  Skin: Skin is warm and dry. No rash noted. He is not diaphoretic.  Psychiatric: He has a normal mood and affect. His behavior is normal.    ED Course   Procedures (including critical care time)  DIAGNOSTIC STUDIES: Oxygen Saturation is 98% on room air, normal by my interpretation.    COORDINATION OF CARE:  6:10 PM: Discussed treatment plan which includes antibiotics and pain medication with pt at bedside and pt agreed to plan.   Labs Reviewed - No data to display No results found.   No diagnosis found.  Dental pain.  MDM    I personally performed the services described in this documentation, which was scribed in my presence. The recorded information has been reviewed and is accurate.      Jimmye Norman, NP 12/16/12 0000

## 2012-12-17 NOTE — ED Provider Notes (Signed)
Medical screening examination/treatment/procedure(s) were performed by non-physician practitioner and as supervising physician I was immediately available for consultation/collaboration.  Raeford Razor, MD 12/17/12 [phone]

## 2015-12-21 DIAGNOSIS — R768 Other specified abnormal immunological findings in serum: Secondary | ICD-10-CM | POA: Insufficient documentation

## 2017-03-14 DIAGNOSIS — Z87898 Personal history of other specified conditions: Secondary | ICD-10-CM | POA: Insufficient documentation

## 2017-03-14 DIAGNOSIS — F1990 Other psychoactive substance use, unspecified, uncomplicated: Secondary | ICD-10-CM | POA: Insufficient documentation

## 2017-04-12 ENCOUNTER — Emergency Department (HOSPITAL_COMMUNITY)
Admission: EM | Admit: 2017-04-12 | Discharge: 2017-04-13 | Disposition: A | Discharge status: Home / Self Care | Payer: Self-pay | Attending: Emergency Medicine | Admitting: Emergency Medicine

## 2017-04-12 ENCOUNTER — Emergency Department (HOSPITAL_COMMUNITY): Payer: Self-pay

## 2017-04-12 ENCOUNTER — Encounter (HOSPITAL_COMMUNITY): Payer: Self-pay | Admitting: Emergency Medicine

## 2017-04-12 DIAGNOSIS — R1031 Right lower quadrant pain: Secondary | ICD-10-CM | POA: Insufficient documentation

## 2017-04-12 DIAGNOSIS — Z87891 Personal history of nicotine dependence: Secondary | ICD-10-CM | POA: Insufficient documentation

## 2017-04-12 DIAGNOSIS — Z880 Allergy status to penicillin: Secondary | ICD-10-CM | POA: Insufficient documentation

## 2017-04-12 HISTORY — DX: Contact with and (suspected) exposure to viral hepatitis: Z20.5

## 2017-04-12 LAB — CBC WITH DIFFERENTIAL/PLATELET
Basophils Absolute: 0 10*3/uL (ref 0.0–0.1)
Basophils Relative: 1 %
Eosinophils Absolute: 0.2 10*3/uL (ref 0.0–0.7)
Eosinophils Relative: 3 %
HEMATOCRIT: 41.7 % (ref 39.0–52.0)
HEMOGLOBIN: 13.3 g/dL (ref 13.0–17.0)
LYMPHS ABS: 2.9 10*3/uL (ref 0.7–4.0)
Lymphocytes Relative: 48 %
MCH: 28.2 pg (ref 26.0–34.0)
MCHC: 31.9 g/dL (ref 30.0–36.0)
MCV: 88.5 fL (ref 78.0–100.0)
Monocytes Absolute: 0.6 10*3/uL (ref 0.1–1.0)
Monocytes Relative: 9 %
NEUTROS ABS: 2.4 10*3/uL (ref 1.7–7.7)
NEUTROS PCT: 39 %
Platelets: 236 10*3/uL (ref 150–400)
RBC: 4.71 MIL/uL (ref 4.22–5.81)
RDW: 14.5 % (ref 11.5–15.5)
WBC: 6.2 10*3/uL (ref 4.0–10.5)

## 2017-04-12 LAB — URINALYSIS, ROUTINE W REFLEX MICROSCOPIC
Bilirubin Urine: NEGATIVE
Glucose, UA: NEGATIVE mg/dL
Hgb urine dipstick: NEGATIVE
Ketones, ur: NEGATIVE mg/dL
Leukocytes, UA: NEGATIVE
NITRITE: NEGATIVE
PH: 5 (ref 5.0–8.0)
Protein, ur: NEGATIVE mg/dL
SPECIFIC GRAVITY, URINE: 1.011 (ref 1.005–1.030)

## 2017-04-12 MED ORDER — OXYCODONE-ACETAMINOPHEN 5-325 MG PO TABS
1.0000 | ORAL_TABLET | ORAL | Status: DC | PRN
  Administered 2017-04-12: 1 via ORAL

## 2017-04-12 MED ORDER — OXYCODONE-ACETAMINOPHEN 5-325 MG PO TABS
ORAL_TABLET | ORAL | Status: AC
  Filled 2017-04-12: qty 1

## 2017-04-12 NOTE — ED Triage Notes (Signed)
Pt c/o pain in bladder area. Pt has history of kidney stones with similar symptoms in the past. Pt has tried drinking extra fluids to flush the stone out without relief.

## 2017-04-12 NOTE — ED Provider Notes (Signed)
[facility] Provider Note   CSN: [id number] Arrival date & time: 04/12/17  1842     History   Chief Complaint Chief Complaint  Patient presents with  . Bladder pain    HPI Randall Parsons is a 36 y.o. male.  The history is provided by the patient and medical records.    36 y.o. M with hx of hep C, kidney stones, Presenting to the ED with right flank pain. Reports this began abruptly at 4:30 PM this afternoon. States initially had some mild pain in his right flank, but now more so localized to right groin and "bladder region". He denies any testicle pain. He has not had any difficulty urinating, no gross hematuria. He has not had any fever or chills. No nausea or vomiting. Reports history of kidney stones in the past, seen at [facility] for this. States prior stones have passed without issue. Patient was given Percocet in the waiting room, currently has no pain.  States he has been drinking sodas this week because of the holiday which he think may have caused some of his symptoms.  Not currently followed by urology.  Past Medical History:  Diagnosis Date  . Exposure to hepatitis B   . Exposure to hepatitis C   . Hepatitis C   . Renal disorder    kidney stones    There are no active problems to display for this patient.   Past Surgical History:  Procedure Laterality Date  . APPENDECTOMY    . EYE SURGERY     secondary to dog bite       Home Medications    Prior to Admission medications   Medication Sig Start Date End Date Taking? Authorizing Provider  Multiple Vitamin (MULTIVITAMIN WITH MINERALS) TABS Take 1 tablet by mouth daily.    [provider]  oxyCODONE-acetaminophen (PERCOCET/ROXICET) 5-325 MG per tablet Take 1 tablet by mouth every 6 (six) hours as needed for pain. 12/15/12   Felicie Morn, NP    Family History No family history on file.  Social History Social History  Substance Use Topics  . Smoking status: Former Games developer  . Smokeless tobacco:  Never Used  . Alcohol use No     Allergies   Amoxicillin and Red dye   Review of Systems Review of Systems  Genitourinary: Positive for flank pain.  All other systems reviewed and are negative.    Physical Exam Updated Vital Signs BP 112/70 (BP Location: Right Arm)   Pulse 65   Temp 98.2 F (36.8 C) (Oral)   Resp 16   Ht 5\' 9"  (1.753 m)   Wt 83.9 kg (185 lb)   SpO2 97%   BMI 27.32 kg/m   Physical Exam  Constitutional: He is oriented to person, place, and time. He appears well-developed and well-nourished.  HENT:  Head: Normocephalic and atraumatic.  Mouth/Throat: Oropharynx is clear and moist.  Eyes: Conjunctivae and EOM are normal. Pupils are equal, round, and reactive to light.  Neck: Normal range of motion.  Cardiovascular: Normal rate, regular rhythm and normal heart sounds.   Pulmonary/Chest: Effort normal and breath sounds normal. No respiratory distress. He has no wheezes.  Abdominal: Soft. Bowel sounds are normal. There is no tenderness. There is no rebound.  Musculoskeletal: Normal range of motion.  Neurological: [name] to person, place, and time.  Skin: Skin is warm and dry.  Psychiatric: He has a normal mood and affect.  Nursing note and vitals reviewed.  ED Treatments / Results  Labs (all labs ordered are listed, but only abnormal results are displayed) Labs Reviewed  URINALYSIS, ROUTINE W REFLEX MICROSCOPIC - Abnormal; Notable for the following:       Result Value   Color, Urine STRAW (*)    All other components within normal limits  BASIC METABOLIC PANEL - Abnormal; Notable for the following:    Sodium 134 (*)    Calcium 8.8 (*)    All other components within normal limits  CBC WITH DIFFERENTIAL/PLATELET    EKG  EKG Interpretation None       Radiology Ct Renal Stone Study  Result Date: 04/12/2017 CLINICAL DATA:  Initial evaluation for acute right flank pain. History of stones. EXAM: CT ABDOMEN AND PELVIS WITHOUT  CONTRAST TECHNIQUE: Multidetector CT imaging of the abdomen and pelvis was performed following the standard protocol without IV contrast. COMPARISON:  Prior CT from 11/13/2010. FINDINGS: Lower chest: Visualized lung bases are clear. Hepatobiliary: The liver demonstrates a normal unenhanced appearance. Gallbladder within normal limits. No biliary dilatation. Pancreas: Pancreas within normal limits. Spleen: Spleen within normal limits. Adrenals/Urinary Tract: Adrenal glands are normal. Multiple nonobstructive calculi present within the kidneys bilaterally, largest of which on the right present within the lower pole and measures 5 mm. Largest on the left positioned within the upper pole and measures 3 mm. No other radiopaque calculi seen along the course of either renal collecting system. No hydronephrosis or hydroureter. No ureterolithiasis. Partially distended bladder within normal limits. No layering stones within the bladder lumen. Stomach/Bowel: Stomach within normal limits. No evidence for bowel obstruction. Appendix is surgically absent. No acute inflammatory changes seen about the bowels. Vascular/Lymphatic: Intra-abdominal aorta of normal caliber. No adenopathy. Reproductive: Prostate within normal limits. Other: No free air or fluid. Musculoskeletal: No acute osseus abnormality. No worrisome lytic or blastic osseous lesions. IMPRESSION: 1. Bilateral nonobstructive nephrolithiasis as above. No CT evidence for obstructive uropathy. No ureterolithiasis. 2. No other acute intra-abdominal or pelvic process. 3. Sequelae of prior appendectomy. Electronically Signed   By: Rise Mu M.D.   On: 04/12/2017 23:07    Procedures Procedures (including critical care time)  Medications Ordered in ED Medications  oxyCODONE-acetaminophen (PERCOCET/ROXICET) 5-325 MG per tablet 1 tablet (1 tablet Oral Given 04/13/17 0010)     Initial Impression / Assessment and Plan / ED Course  I have reviewed the triage  vital signs and the nursing notes.  Pertinent labs & imaging results that were available during my care of the patient were reviewed by me and considered in my medical decision making (see chart for details).  36 y.o. M here with abdominal pain.  Reports right flank pain earlier, now right lower/groin.  No testicle pain.  Afebrile, non-toxic here.  Exam is benign after percocet in waiting room.  Labs reassuring.  UA without signs of infection.  CT renal study obtained-- non-obstructing bilateral renal stones, no acute ureteral stone.  No other acute findings.  Patient remains stable here.  appropriate for discharge.  Will have him follow-up with urology.  Wants note for day of bed rest at his sober living house which was given.  Discussed plan with patient, he acknowledged understanding and agreed with plan of care.  Return precautions given for new or worsening symptoms.  Final Clinical Impressions(s) / ED Diagnoses   Final diagnoses:  Right lower quadrant abdominal pain    New Prescriptions Discharge Medication List as of 04/13/2017 12:29 AM    START taking these medications  Details  naproxen (NAPROSYN) 500 MG tablet Take 1 tablet (500 mg total) by mouth 2 (two) times daily with a meal., Starting Sun 04/13/2017, Print         Garlon Hatchet, PA-C 04/13/17 0428    Rolland Porter, MD 04/23/17 [phone]

## 2017-04-13 LAB — BASIC METABOLIC PANEL
Anion gap: 7 (ref 5–15)
BUN: 11 mg/dL (ref 6–20)
CALCIUM: 8.8 mg/dL — AB (ref 8.9–10.3)
CO2: 24 mmol/L (ref 22–32)
CREATININE: 1.09 mg/dL (ref 0.61–1.24)
Chloride: 103 mmol/L (ref 101–111)
GFR calc Af Amer: >60 mL/min (ref >60)
Glucose, Bld: 98 mg/dL (ref 65–99)
Potassium: 4 mmol/L (ref 3.5–5.1)
SODIUM: 134 mmol/L — AB (ref 135–145)

## 2017-04-13 MED ORDER — NAPROXEN 500 MG PO TABS: 500.0000 mg | ORAL_TABLET | Freq: Two times a day (BID) | ORAL | 0 refills | Status: DC

## 2017-04-13 MED ORDER — OXYCODONE-ACETAMINOPHEN 5-325 MG PO TABS
1.0000 | ORAL_TABLET | Freq: Once | ORAL | Status: AC
  Administered 2017-04-13: 1 via ORAL
  Filled 2017-04-13: qty 1

## 2017-04-13 NOTE — Discharge Instructions (Signed)
Take the prescribed medication as directed. °Follow-up with urology. °Return to the ED for new or worsening symptoms. ° °

## 2017-08-06 ENCOUNTER — Encounter (HOSPITAL_COMMUNITY): Payer: Self-pay | Admitting: Family Medicine

## 2017-08-06 ENCOUNTER — Ambulatory Visit (HOSPITAL_COMMUNITY)
Admission: EM | Admit: 2017-08-06 | Discharge: 2017-08-06 | Disposition: A | Discharge status: Home / Self Care | Payer: Self-pay | Attending: Family Medicine | Admitting: Family Medicine

## 2017-08-06 DIAGNOSIS — S86911A Strain of unspecified muscle(s) and tendon(s) at lower leg level, right leg, initial encounter: Secondary | ICD-10-CM

## 2017-08-06 DIAGNOSIS — S39012A Strain of muscle, fascia and tendon of lower back, initial encounter: Secondary | ICD-10-CM

## 2017-08-06 MED ORDER — CYCLOBENZAPRINE HCL 5 MG PO TABS: 5.0000 mg | ORAL_TABLET | Freq: Every day | ORAL | 1 refills | Status: DC

## 2017-08-06 MED ORDER — GABAPENTIN 300 MG PO CAPS: 300.0000 mg | ORAL_CAPSULE | Freq: Three times a day (TID) | ORAL | 1 refills | Status: DC

## 2017-08-06 NOTE — ED Provider Notes (Signed)
[facility]   [id number] 08/06/17 Arrival Time: 1231   SUBJECTIVE:  Randall Parsons is a 36 y.o. male who presents to the urgent care with complaint of right leg pain.  He had major surgery 1 year ago on his right lower extremity after he fell and fractured his tib-fib.  Patient stays at [facility] house.  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   Patient does not want narcotics because of past problems.  Past Medical History:  Diagnosis Date  . Exposure to hepatitis B   . Exposure to hepatitis C   . Hepatitis C   . Renal disorder    kidney stones   History reviewed. No pertinent family history. Social History   Social History  . Marital status: Single    Spouse name: N/A  . Number of children: N/A  . Years of education: N/A   Occupational History  . Not on file.   Social History Main Topics  . Smoking status: Former Games developer  . Smokeless tobacco: Never Used  . Alcohol use No  . Drug use: No  . Sexual activity: Not on file   Other Topics Concern  . Not on file   Social History Narrative  . No narrative on file   Current Meds  Medication Sig  . hydrOXYzine (ATARAX/VISTARIL) 25 MG tablet Take 25 mg by mouth 3 (three) times daily.  Marland Kitchen lisinopril (PRINIVIL,ZESTRIL) 2.5 MG tablet Take 2.5 mg by mouth daily.  Marland Kitchen PARoxetine (PAXIL) 20 MG tablet Take 40 mg by mouth daily.   Allergies  Allergen Reactions  . Amoxicillin     HIVES  . Red Dye Hives      ROS: As per HPI, remainder of ROS negative.   OBJECTIVE:   Vitals:   08/06/17 1246  BP: 118/76  Pulse: 83  Resp: 16  Temp: 97.7 F (36.5 C)  TempSrc: Oral  SpO2: 98%     General appearance: alert; no distress Eyes: PERRL; EOMI; conjunctiva normal HENT: normocephalic; atraumatic;, external ears normal without trauma; nasal mucosa normal; oral mucosa  normal Neck: supple Lungs: clear to auscultation bilaterally Heart: regular rate and rhythm  Back: no CVA tenderness Extremities: no cyanosis or edema; symmetrical with no gross deformities; well-healed surgical scars above right patella and above the ankle on the right side. Good pulses bilaterally. Straight leg raising is normal Skin: warm and dry Neurologic: normal gait; grossly normal Psychological: alert and cooperative; normal mood and affect   Labs:  Results for orders placed or performed during the hospital encounter of 04/12/17  Urinalysis, Routine w reflex microscopic- may I&O cath if menses  Result Value Ref Range   Color, Urine STRAW (A) YELLOW   APPearance CLEAR CLEAR   Specific Gravity, Urine 1.011 1.005 - 1.030   pH 5.0 5.0 - 8.0   Glucose, UA NEGATIVE NEGATIVE mg/dL   Hgb urine dipstick NEGATIVE NEGATIVE   Bilirubin Urine NEGATIVE NEGATIVE   Ketones, ur NEGATIVE NEGATIVE mg/dL   Protein, ur NEGATIVE NEGATIVE mg/dL   Nitrite NEGATIVE NEGATIVE   Leukocytes, UA NEGATIVE NEGATIVE  CBC with Differential  Result Value Ref Range   WBC 6.2 4.0 - 10.5 K/uL   RBC 4.71 4.22 - 5.81 MIL/uL   Hemoglobin 13.3 13.0 - 17.0 g/dL   HCT 12.4 58.0 - 99.8 %   MCV 88.5 78.0 - 100.0  fL   MCH 28.2 26.0 - 34.0 pg   MCHC 31.9 30.0 - 36.0 g/dL   RDW 10.214.5 72.511.5 - 36.615.5 %   Platelets 236 150 - 400 K/uL   Neutrophils Relative % 39 %   Neutro Abs 2.4 1.7 - 7.7 K/uL   Lymphocytes Relative 48 %   Lymphs Abs 2.9 0.7 - 4.0 K/uL   Monocytes Relative 9 %   Monocytes Absolute 0.6 0.1 - 1.0 K/uL   Eosinophils Relative 3 %   Eosinophils Absolute 0.2 0.0 - 0.7 K/uL   Basophils Relative 1 %   Basophils Absolute 0.0 0.0 - 0.1 K/uL  Basic metabolic panel  Result Value Ref Range   Sodium 134 (L) 135 - 145 mmol/L   Potassium 4.0 3.5 - 5.1 mmol/L   Chloride 103 101 - 111 mmol/L   CO2 24 22 - 32 mmol/L   Glucose, Bld 98 65 - 99 mg/dL   BUN 11 6 - 20 mg/dL   Creatinine, Ser 4.401.09 0.61 - 1.24  mg/dL   Calcium 8.8 (L) 8.9 - 10.3 mg/dL   GFR calc non Af Amer >60 >60 mL/min   GFR calc Af Amer >60 >60 mL/min   Anion gap 7 5 - 15    Labs Reviewed - No data to display  No results found.     ASSESSMENT & PLAN:  1. Strain of lumbar region, initial encounter   2. Muscle strain of right lower leg, initial encounter     Meds ordered this encounter  Medications  . gabapentin (NEURONTIN) 300 MG capsule    Sig: Take 1 capsule (300 mg total) by mouth 3 (three) times daily.    Dispense:  30 capsule    Refill:  1  . cyclobenzaprine (FLEXERIL) 5 MG tablet    Sig: Take 1 tablet (5 mg total) by mouth at bedtime.    Dispense:  21 tablet    Refill:  1    Reviewed expectations re: course of current medical issues. Questions answered. Outlined signs and symptoms indicating need for more acute intervention. Patient verbalized understanding. After Visit Summary given.    Procedures:      Elvina SidleLauenstein, Genae Strine, MD 08/06/17 1301

## 2017-08-06 NOTE — Discharge Instructions (Signed)
Return if the pain is not well controlled after several days.

## 2017-08-06 NOTE — ED Triage Notes (Signed)
Patient reports lower back pain and right leg pain. 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 Patient states that he injured his right leg last year and had a rod in place. Reports discomfort to same area. Patient reports muscle relaxants and antiinflammatory seem to work best when he has this kind of discomfort in his back.

## 2017-08-11 ENCOUNTER — Encounter (HOSPITAL_COMMUNITY): Payer: Self-pay | Admitting: Emergency Medicine

## 2017-08-11 ENCOUNTER — Ambulatory Visit (HOSPITAL_COMMUNITY)
Admission: EM | Admit: 2017-08-11 | Discharge: 2017-08-11 | Disposition: A | Discharge status: Home / Self Care | Payer: Self-pay | Attending: Family Medicine | Admitting: Family Medicine

## 2017-08-11 DIAGNOSIS — M545 Low back pain, unspecified: Secondary | ICD-10-CM

## 2017-08-11 DIAGNOSIS — I10 Essential (primary) hypertension: Secondary | ICD-10-CM

## 2017-08-11 DIAGNOSIS — Z760 Encounter for issue of repeat prescription: Secondary | ICD-10-CM

## 2017-08-11 MED ORDER — LISINOPRIL 2.5 MG PO TABS: 2.5000 mg | ORAL_TABLET | Freq: Every day | ORAL | 0 refills | Status: DC

## 2017-08-11 MED ORDER — NAPROXEN 500 MG PO TABS: 500.0000 mg | ORAL_TABLET | Freq: Two times a day (BID) | ORAL | 0 refills | Status: DC

## 2017-08-11 NOTE — ED Provider Notes (Signed)
[facility]    CSN: [id number] Arrival date & time: 08/11/17  1006     History   Chief Complaint Chief Complaint  Patient presents with  . Medication Refill  . Back Pain    HPI Randall Parsons is a 36 y.o. male.   36 year old male with history of hepatitis C comes in for medication refill and back pain. Patient states he has been on lisinopril for many years without problems, most recently, medication has been refilled by [facility]. He was recently informed that [facility] will no longer be able to refill his lisinopril and needs a refill prior to PCP establishment. Patient states he ran out of medication today. He has had good control of his blood pressure on this medication.   He was also seen here recently for back pain. States medication given had been helping. But back pain has been worse since heavy lifting required from work. Denies saddle anesthesia, loss of bladder/bowel control. He continues flexeril and gabapentin.       Past Medical History:  Diagnosis Date  . Exposure to hepatitis B   . Exposure to hepatitis C   . Hepatitis C   . Renal disorder    kidney stones    There are no active problems to display for this patient.   Past Surgical History:  Procedure Laterality Date  . APPENDECTOMY    . EYE SURGERY     secondary to dog bite       Home Medications    Prior to Admission medications   Medication Sig Start Date End Date Taking? Authorizing Provider  acetaminophen (TYLENOL) 500 MG tablet Take 1,000 mg by mouth every 6 (six) hours as needed for mild pain.    [provider]  cyclobenzaprine (FLEXERIL) 5 MG tablet Take 1 tablet (5 mg total) by mouth at bedtime. 08/06/17   Elvina Sidle, MD  gabapentin (NEURONTIN) 300 MG capsule Take 1 capsule (300 mg total) by mouth 3 (three) times daily. 08/06/17   Elvina Sidle, MD  hydrOXYzine (ATARAX/VISTARIL) 25 MG tablet Take 25 mg by mouth 3 (three) times daily.    [provider]    lisinopril (PRINIVIL,ZESTRIL) 2.5 MG tablet Take 1 tablet (2.5 mg total) daily by mouth. 08/11/17   Cathie Hoops, Toyna Erisman V, PA-C  naproxen (NAPROSYN) 500 MG tablet Take 1 tablet (500 mg total) 2 (two) times daily with a meal by mouth. 08/11/17   Cathie Hoops, Burel Kahre V, PA-C  PARoxetine (PAXIL) 20 MG tablet Take 40 mg by mouth daily.    [provider]    Family History No family history on file.  Social History Social History   Tobacco Use  . Smoking status: Former Games developer  . Smokeless tobacco: Never Used  Substance Use Topics  . Alcohol use: No  . Drug use: No     Allergies   Amoxicillin and Red dye   Review of Systems Review of Systems  Reason unable to perform ROS: See HPI as above.     Physical Exam Triage Vital Signs ED Triage Vitals  Enc Vitals Group     BP 08/11/17 1052 113/71     Pulse Rate 08/11/17 1052 81     Resp 08/11/17 1052 18     Temp 08/11/17 1052 98.3 F (36.8 C)     Temp src --      SpO2 08/11/17 1052 100 %     Weight --      Height --  Head Circumference --      Peak Flow --      Pain Score 08/11/17 1053 8     Pain Loc --      Pain Edu? --      Excl. in GC? --    No data found.  Updated Vital Signs BP 113/71   Pulse 81   Temp 98.3 F (36.8 C)   Resp 18   SpO2 100%   Physical Exam  Constitutional: He is oriented to person, place, and time. He appears well-developed and well-nourished. No distress.  HENT:  Head: Normocephalic and atraumatic.  Eyes: Conjunctivae are normal. Pupils are equal, round, and reactive to light.  Cardiovascular: Normal rate, regular rhythm and normal heart sounds. Exam reveals no gallop and no friction rub.  No murmur heard. Pulmonary/Chest: Effort normal and breath sounds normal. He has no wheezes. He has no rales.  Musculoskeletal:  No tenderness on palpation of the spinous processes/bilateral back. Full range of motion of back, which elicits the pain. Strength normal and equal bilaterally. Sensation intact and equal  bilaterally.   Neurological: [name] to person, place, and time.  Skin: Skin is warm and dry.     UC Treatments / Results  Labs (all labs ordered are listed, but only abnormal results are displayed) Labs Reviewed - No data to display  EKG  EKG Interpretation None       Radiology No results found.  Procedures Procedures (including critical care time)  Medications Ordered in UC Medications - No data to display   Initial Impression / Assessment and Plan / UC Course  I have reviewed the triage vital signs and the nursing notes.  Pertinent labs & imaging results that were available during my care of the patient were reviewed by me and considered in my medical decision making (see chart for details).    Lisinopril refilled. Patient to establish PCP care for further refill and management of HTN. Resources provided.   Discussed with patient history and exam most consistent with muscle strain. Start NSAID as directed for pain and inflammation. Muscle relaxant as needed. Ice/heat compresses. Discussed with patient strain can take up to 3-4 weeks to resolve, but should be getting better each week. Return precautions given.   Final Clinical Impressions(s) / UC Diagnoses   Final diagnoses:  Medication refill  Acute bilateral low back pain without sciatica    New Prescriptions This SmartLink is deprecated. Use AVSMEDLIST instead to display the medication list for a patient.    Belinda FisherYu, Adriann Thau V, PA-C 08/11/17 1304

## 2017-08-11 NOTE — ED Triage Notes (Signed)
Pt got his prescription of lisinopril at [facility] but they now told him they could only given him his psych meds. Pt ran out of his lisinopril today, needs prescription.

## 2017-08-11 NOTE — Discharge Instructions (Signed)
Your exam was most consistent with muscle strain. Start Naproxen as directed. Flexeril as needed at night. Flexeril can make you drowsy, so do not take if you are going to drive, operate heavy machinery, or make important decisions. Ice/heat compresses as needed. Back brace during activity may prevent further injury to the back. This can take up to 3-4 weeks to completely resolve, but you should be feeling better each week. Follow up here or with PCP if symptoms worsen, changes for reevaluation. If experience numbness/tingling of the inner thighs, loss of bladder or bowel control, go to the emergency department for evaluation.   I have refilled your lisinopril for 30 days, please establish PCP care for further refills.

## 2017-09-17 ENCOUNTER — Encounter (HOSPITAL_COMMUNITY): Payer: Self-pay | Admitting: Emergency Medicine

## 2017-09-17 ENCOUNTER — Ambulatory Visit (HOSPITAL_COMMUNITY)
Admission: EM | Admit: 2017-09-17 | Discharge: 2017-09-17 | Disposition: A | Discharge status: Home / Self Care | Payer: Self-pay | Attending: Family Medicine | Admitting: Family Medicine

## 2017-09-17 DIAGNOSIS — G8929 Other chronic pain: Secondary | ICD-10-CM

## 2017-09-17 DIAGNOSIS — M79604 Pain in right leg: Secondary | ICD-10-CM

## 2017-09-17 DIAGNOSIS — I10 Essential (primary) hypertension: Secondary | ICD-10-CM

## 2017-09-17 DIAGNOSIS — Z760 Encounter for issue of repeat prescription: Secondary | ICD-10-CM

## 2017-09-17 DIAGNOSIS — M545 Low back pain: Secondary | ICD-10-CM

## 2017-09-17 LAB — POCT I-STAT, CHEM 8
BUN: 16 mg/dL (ref 6–20)
CALCIUM ION: 1.2 mmol/L (ref 1.15–1.40)
Chloride: 103 mmol/L (ref 101–111)
Creatinine, Ser: 1 mg/dL (ref 0.61–1.24)
GLUCOSE: 102 mg/dL — AB (ref 65–99)
HCT: 45 % (ref 39.0–52.0)
Hemoglobin: 15.3 g/dL (ref 13.0–17.0)
Potassium: 4.2 mmol/L (ref 3.5–5.1)
SODIUM: 139 mmol/L (ref 135–145)
TCO2: 28 mmol/L (ref 22–32)

## 2017-09-17 MED ORDER — MELOXICAM 7.5 MG PO TABS: 7.5000 mg | ORAL_TABLET | Freq: Every day | ORAL | 0 refills | Status: DC

## 2017-09-17 MED ORDER — LISINOPRIL 2.5 MG PO TABS: 2.5000 mg | ORAL_TABLET | Freq: Every day | ORAL | 0 refills | Status: DC

## 2017-09-17 NOTE — Discharge Instructions (Signed)
[facility] to make an appointment to establish care. Meloxicam daily for back and leg pain, take with food.  Regular activity and core strengthening to promote back rehab, see exercises provided.

## 2017-09-17 NOTE — ED Triage Notes (Signed)
PT ran out of lisinopril 1 week ago. PT would also like refills on flexiril and gabapentin. PT is waiting to see PCP

## 2017-09-17 NOTE — ED Provider Notes (Signed)
[facility]    CSN: [id number] Arrival date & time: 09/17/17  1246     History   Chief Complaint Chief Complaint  Patient presents with  . Medication Refill    HPI Randall Parsons is a 36 y.o. male.   [name] presents with requests for his blood pressure medication refill. He ran out of lisinopril approximately 1 week ago. He has a history of hypertension. He states he also has chronic low back pain which he has taken flexeril for in the past, but ran out of this approximately 1 month ago. Further, he has chronic right leg pain s/p tib/fib fracture repair and states he has taken gabapentin for this which has helped. Has not taken this is approximately 1 month. Has taken naproxen at times, states it minimally helps. Rates his pain 7/10, worse with activities. States he works at a [employer] and does a lot of bending and lifting which increases his pain. Denies weakness, numbness or tingling to his lower extremities. Has not established with a primary care provider.    ROS per HPI.       Past Medical History:  Diagnosis Date  . Exposure to hepatitis B   . Exposure to hepatitis C   . Hepatitis C   . Renal disorder    kidney stones    There are no active problems to display for this patient.   Past Surgical History:  Procedure Laterality Date  . APPENDECTOMY    . EYE SURGERY     secondary to dog bite       Home Medications    Prior to Admission medications   Medication Sig Start Date End Date Taking? Authorizing Provider  acetaminophen (TYLENOL) 500 MG tablet Take 1,000 mg by mouth every 6 (six) hours as needed for mild pain.    [provider]  cyclobenzaprine (FLEXERIL) 5 MG tablet Take 1 tablet (5 mg total) by mouth at bedtime. 08/06/17   Elvina Sidle, MD  gabapentin (NEURONTIN) 300 MG capsule Take 1 capsule (300 mg total) by mouth 3 (three) times daily. 08/06/17   Elvina Sidle, MD  hydrOXYzine (ATARAX/VISTARIL) 25 MG tablet Take  25 mg by mouth 3 (three) times daily.    [provider]  lisinopril (PRINIVIL,ZESTRIL) 2.5 MG tablet Take 1 tablet (2.5 mg total) by mouth daily. 09/17/17   Georgetta Haber, NP  meloxicam (MOBIC) 7.5 MG tablet Take 1 tablet (7.5 mg total) by mouth daily. 09/17/17   Georgetta Haber, NP  naproxen (NAPROSYN) 500 MG tablet Take 1 tablet (500 mg total) 2 (two) times daily with a meal by mouth. 08/11/17   Cathie Hoops, Amy V, PA-C  PARoxetine (PAXIL) 20 MG tablet Take 40 mg by mouth daily.    [provider]    Family History No family history on file.  Social History Social History   Tobacco Use  . Smoking status: Former Games developer  . Smokeless tobacco: Never Used  Substance Use Topics  . Alcohol use: No  . Drug use: No     Allergies   Amoxicillin and Red dye   Review of Systems Review of Systems   Physical Exam Triage Vital Signs ED Triage Vitals  Enc Vitals Group     BP 09/17/17 1353 126/89     Pulse Rate 09/17/17 1353 81     Resp 09/17/17 1353 16     Temp 09/17/17 1353 98.2 F (36.8 C)     Temp Source 09/17/17 1353 Oral  SpO2 09/17/17 1353 96 %     Weight 09/17/17 1352 203 lb (92.1 kg)     Height 09/17/17 1352 5\' 9"  (1.753 m)     Head Circumference --      Peak Flow --      Pain Score 09/17/17 1352 8     Pain Loc --      Pain Edu? --      Excl. in GC? --    No data found.  Updated Vital Signs BP 126/89   Pulse 81   Temp 98.2 F (36.8 C) (Oral)   Resp 16   Ht 5\' 9"  (1.753 m)   Wt 203 lb (92.1 kg)   SpO2 96%   BMI 29.98 kg/m   Visual Acuity Right Eye Distance:   Left Eye Distance:   Bilateral Distance:    Right Eye Near:   Left Eye Near:    Bilateral Near:     Physical Exam  Constitutional: He is oriented to person, place, and time. He appears well-developed and well-nourished.  Cardiovascular: Normal rate and regular rhythm.  Pulmonary/Chest: Effort normal and breath sounds normal.  Musculoskeletal:       Lumbar back: He exhibits  pain. He exhibits normal range of motion, no bony tenderness, no swelling, no deformity and normal pulse.       Right lower leg: He exhibits no tenderness, no bony tenderness, no swelling, no edema, no deformity and no laceration.  Scarring present to RLE; sensation intact; full ROM to back and lower extremities; lower extremities with equal strength   Neurological: [name] to person, place, and time.  Skin: Skin is warm and dry.     UC Treatments / Results  Labs (all labs ordered are listed, but only abnormal results are displayed) Labs Reviewed  POCT I-STAT, CHEM 8 - Abnormal; Notable for the following components:      Result Value   Glucose, Bld 102 (*)    All other components within normal limits    EKG  EKG Interpretation None       Radiology No results found.  Procedures Procedures (including critical care time)  Medications Ordered in UC Medications - No data to display   Initial Impression / Assessment and Plan / UC Course  I have reviewed the triage vital signs and the nursing notes.  Pertinent labs & imaging results that were available during my care of the patient were reviewed by me and considered in my medical decision making (see chart for details).     Creatinine normal today. Lisinopril refilled. Will try meloxicam daily as has not taken gabapentin in at least a month. Discussed with patient that a titration may be needed if this medication is restarted. Back rehab exercises provided, encouraged core strengthening. To call and establish with a pcp today for further follow up and medication refills. Patient verbalized understanding and agreeable to plan.    Final Clinical Impressions(s) / UC Diagnoses   Final diagnoses:  Medication refill  Chronic bilateral low back pain, with sciatica presence unspecified  Chronic pain of right lower extremity    ED Discharge Orders        Ordered    lisinopril (PRINIVIL,ZESTRIL) 2.5 MG tablet   Daily     09/17/17 1439    meloxicam (MOBIC) 7.5 MG tablet  Daily     09/17/17 1440       Controlled Substance Prescriptions [place] Controlled Substance Registry consulted? Not Applicable   OgdenBurky,  Barron Alvine, NP 09/17/17 1452

## 2017-10-03 ENCOUNTER — Encounter (HOSPITAL_COMMUNITY): Payer: Self-pay | Admitting: Emergency Medicine

## 2017-10-03 ENCOUNTER — Ambulatory Visit (HOSPITAL_COMMUNITY)
Admission: EM | Admit: 2017-10-03 | Discharge: 2017-10-03 | Disposition: A | Discharge status: Home / Self Care | Payer: Self-pay | Attending: Internal Medicine | Admitting: Internal Medicine

## 2017-10-03 DIAGNOSIS — K6289 Other specified diseases of anus and rectum: Secondary | ICD-10-CM

## 2017-10-03 DIAGNOSIS — K648 Other hemorrhoids: Secondary | ICD-10-CM

## 2017-10-03 MED ORDER — NAPROXEN 500 MG PO TABS: 500.0000 mg | ORAL_TABLET | Freq: Two times a day (BID) | ORAL | 0 refills | Status: DC

## 2017-10-03 MED ORDER — GABAPENTIN 300 MG PO CAPS: 300.0000 mg | ORAL_CAPSULE | Freq: Three times a day (TID) | ORAL | 0 refills | Status: DC

## 2017-10-03 MED ORDER — HYDROCORTISONE ACETATE 25 MG RE SUPP: 25.0000 mg | Freq: Two times a day (BID) | RECTAL | 0 refills | Status: DC

## 2017-10-03 NOTE — ED Provider Notes (Signed)
[facility]    CSN: [id number] Arrival date & time: 10/03/17  1721     History   Chief Complaint Chief Complaint  Patient presents with  . Hemorrhoids    HPI Randall Parsons is a 36 y.o. male.   36 year old male, with history of hemorrhoids, presenting today complaining of rectal pain.  States that he believes he has recurrence of his internal hemorrhoids.  States that he has been doing squats recently at the gym and has been straining.  He has had no rectal bleeding.   The history is provided by the patient.  Rectal Bleeding  Quality:  Unable to specify Duration:  3 days Timing:  Constant Chronicity:  Recurrent Context: hemorrhoids   Context: not anal fissures and not constipation   Similar prior episodes: yes   Relieved by:  Nothing Worsened by:  Nothing Ineffective treatments:  None tried Associated symptoms: no abdominal pain, no dizziness, no epistaxis, no fever, no hematemesis, no light-headedness, no loss of consciousness, no recent illness and no vomiting   Risk factors: no anticoagulant use, no hx of colorectal cancer, no hx of colorectal surgery, no hx of IBD, no liver disease, no NSAID use and no steroid use     Past Medical History:  Diagnosis Date  . Exposure to hepatitis B   . Exposure to hepatitis C   . Hepatitis C   . Renal disorder    kidney stones    There are no active problems to display for this patient.   Past Surgical History:  Procedure Laterality Date  . APPENDECTOMY    . EYE SURGERY     secondary to dog bite       Home Medications    Prior to Admission medications   Medication Sig Start Date End Date Taking? Authorizing Provider  hydrOXYzine (ATARAX/VISTARIL) 25 MG tablet Take 25 mg by mouth 3 (three) times daily.   Yes [provider]  lisinopril (PRINIVIL,ZESTRIL) 2.5 MG tablet Take 1 tablet (2.5 mg total) by mouth daily. 09/17/17  Yes Burky, Dorene Grebe B, NP  meloxicam (MOBIC) 7.5 MG tablet Take 1 tablet  (7.5 mg total) by mouth daily. 09/17/17  Yes Burky, Dorene Grebe B, NP  PARoxetine (PAXIL) 20 MG tablet Take 40 mg by mouth daily.   Yes [provider]  acetaminophen (TYLENOL) 500 MG tablet Take 1,000 mg by mouth every 6 (six) hours as needed for mild pain.    [provider]  cyclobenzaprine (FLEXERIL) 5 MG tablet Take 1 tablet (5 mg total) by mouth at bedtime. 08/06/17   Elvina Sidle, MD  gabapentin (NEURONTIN) 300 MG capsule Take 1 capsule (300 mg total) by mouth 3 (three) times daily. 10/03/17 11/02/17  Michael Ventresca, Marylene Land, PA-C  hydrocortisone (ANUSOL-HC) 25 MG suppository Place 1 suppository (25 mg total) rectally 2 (two) times daily. 10/03/17   Katasha Riga C, PA-C  naproxen (NAPROSYN) 500 MG tablet Take 1 tablet (500 mg total) by mouth 2 (two) times daily with a meal. 10/03/17   Pleas Carneal, Marylene Land, PA-C    Family History No family history on file.  Social History Social History   Tobacco Use  . Smoking status: Former Games developer  . Smokeless tobacco: Never Used  Substance Use Topics  . Alcohol use: No  . Drug use: No     Allergies   Amoxicillin and Red dye   Review of Systems Review of Systems  Constitutional: Negative for chills and fever.  HENT: Negative for ear pain, nosebleeds and  sore throat.   Eyes: Negative for pain and visual disturbance.  Respiratory: Negative for cough and shortness of breath.   Cardiovascular: Negative for chest pain and palpitations.  Gastrointestinal: Positive for hematochezia and rectal pain. Negative for abdominal pain, hematemesis and vomiting.  Genitourinary: Negative for dysuria and hematuria.  Musculoskeletal: Negative for arthralgias and back pain.  Skin: Negative for color change and rash.  Neurological: Negative for dizziness, seizures, loss of consciousness, syncope and light-headedness.  All other systems reviewed and are negative.    Physical Exam Triage Vital Signs ED Triage Vitals  Enc Vitals Group     BP 10/03/17  1745 122/81     Pulse Rate 10/03/17 1745 80     Resp 10/03/17 1745 16     Temp 10/03/17 1745 98.5 F (36.9 C)     Temp Source 10/03/17 1745 Oral     SpO2 10/03/17 1745 96 %     Weight 10/03/17 1746 205 lb (93 kg)     Height 10/03/17 1746 5\' 9"  (1.753 m)     Head Circumference --      Peak Flow --      Pain Score 10/03/17 1746 7     Pain Loc --      Pain Edu? --      Excl. in GC? --    No data found.  Updated Vital Signs BP 122/81   Pulse 80   Temp 98.5 F (36.9 C) (Oral)   Resp 16   Ht 5\' 9"  (1.753 m)   Wt 205 lb (93 kg)   SpO2 96%   BMI 30.27 kg/m   Visual Acuity Right Eye Distance:   Left Eye Distance:   Bilateral Distance:    Right Eye Near:   Left Eye Near:    Bilateral Near:     Physical Exam  Constitutional: He appears well-developed and well-nourished.  HENT:  Head: Normocephalic and atraumatic.  Eyes: Conjunctivae are normal.  Neck: Neck supple.  Cardiovascular: Normal rate and regular rhythm.  No murmur heard. Pulmonary/Chest: Effort normal and breath sounds normal. No respiratory distress.  Abdominal: Soft. There is no tenderness.  Genitourinary: Rectum normal. Rectal exam shows no external hemorrhoid.  Genitourinary Comments: Chaperone present during rectal exam.  No external hemorrhoids noted  Musculoskeletal: He exhibits no edema.  Neurological: He is alert.  Skin: Skin is warm and dry.  Psychiatric: He has a normal mood and affect.  Nursing note and vitals reviewed.    UC Treatments / Results  Labs (all labs ordered are listed, but only abnormal results are displayed) Labs Reviewed - No data to display  EKG  EKG Interpretation None       Radiology No results found.  Procedures Procedures (including critical care time)  Medications Ordered in UC Medications - No data to display   Initial Impression / Assessment and Plan / UC Course  I have reviewed the triage vital signs and the nursing notes.  Pertinent labs & imaging  results that were available during my care of the patient were reviewed by me and considered in my medical decision making (see chart for details).     Patient has had Anusol suppositories in the past.  Requesting these again.  Is also requesting refill of his gabapentin and Naprosyn.  Final Clinical Impressions(s) / UC Diagnoses   Final diagnoses:  Rectal pain  Internal hemorrhoids    ED Discharge Orders        Ordered    gabapentin (  NEURONTIN) 300 MG capsule  3 times daily     10/03/17 1805    naproxen (NAPROSYN) 500 MG tablet  2 times daily with meals     10/03/17 1805    hydrocortisone (ANUSOL-HC) 25 MG suppository  2 times daily     10/03/17 1806       Controlled Substance Prescriptions [place] Controlled Substance Registry consulted? Not Applicable   Alecia LemmingBlue, Chilton Sallade C, [place]PA-C 10/03/17 [id number]

## 2017-10-03 NOTE — ED Triage Notes (Signed)
PT reports he has been diagnosed with internal hemerhoids. PT reports flare up for last week with bleeding.

## 2017-10-17 ENCOUNTER — Other Ambulatory Visit: Payer: Self-pay

## 2017-10-17 ENCOUNTER — Encounter (HOSPITAL_COMMUNITY): Payer: Self-pay | Admitting: Emergency Medicine

## 2017-10-17 ENCOUNTER — Ambulatory Visit (HOSPITAL_COMMUNITY)
Admission: EM | Admit: 2017-10-17 | Discharge: 2017-10-17 | Disposition: A | Discharge status: Home / Self Care | Payer: Self-pay | Attending: Internal Medicine | Admitting: Internal Medicine

## 2017-10-17 DIAGNOSIS — G8929 Other chronic pain: Secondary | ICD-10-CM

## 2017-10-17 DIAGNOSIS — Z760 Encounter for issue of repeat prescription: Secondary | ICD-10-CM

## 2017-10-17 DIAGNOSIS — M545 Low back pain, unspecified: Secondary | ICD-10-CM

## 2017-10-17 MED ORDER — METHOCARBAMOL 500 MG PO TABS: 500.0000 mg | ORAL_TABLET | Freq: Every evening | ORAL | 0 refills | Status: DC | PRN

## 2017-10-17 MED ORDER — MELOXICAM 15 MG PO TABS: 15.0000 mg | ORAL_TABLET | Freq: Every day | ORAL | 0 refills | Status: DC

## 2017-10-17 MED ORDER — LISINOPRIL 2.5 MG PO TABS: 2.5000 mg | ORAL_TABLET | Freq: Every day | ORAL | 0 refills | Status: DC

## 2017-10-17 NOTE — ED Triage Notes (Signed)
The patient presented to the [facility] with a complaint of needing his HTN and anti-inflammatory medications refilled.

## 2017-10-17 NOTE — Discharge Instructions (Signed)
Daily meloxicam, do not take additional naproxen.  Lisinopril daily for your Bp.  May use robaxin at night as needed for muscle spasm. Do not take if driving or drinking alcohol as may cause drowsiness. Please establish with a primary care provider for further management of your chronic back pain and hypertension.

## 2017-10-17 NOTE — ED Provider Notes (Signed)
[facility]    CSN: [id number] Arrival date & time: 10/17/17  1717     History   Chief Complaint Chief Complaint  Patient presents with  . Medication Refill    HPI Randall Parsons is a 37 y.o. male.   Randall Parsons presents with complaints of chronic low back pain and hypertension with need for refills of his medication. He states he has been unable to get in with a PCP as he works 7 days a week and is in school at night. Takes lisinopril 2.5mg  daily for his blood pressure. Has been taking meloxicam daily which has helped with back pain. He works at a [employer] and repeatedly bending over at work which increases his pain. States he has had an MRI in the past but it has been years, has done p/t in the past. Rates pain 8/10. States he has increased spasms at night after working all day. Pain is to bilateral low back. Does not radiate down legs. Creatinine 1.0 in December 2018. Without known kidney disease.    ROS per HPI.       Past Medical History:  Diagnosis Date  . Exposure to hepatitis B   . Exposure to hepatitis C   . Hepatitis C   . Renal disorder    kidney stones    There are no active problems to display for this patient.   Past Surgical History:  Procedure Laterality Date  . APPENDECTOMY    . EYE SURGERY     secondary to dog bite       Home Medications    Prior to Admission medications   Medication Sig Start Date End Date Taking? Authorizing Provider  gabapentin (NEURONTIN) 300 MG capsule Take 300 mg by mouth 3 (three) times daily.   Yes [provider]  naproxen (NAPROSYN) 500 MG tablet Take 1 tablet (500 mg total) by mouth 2 (two) times daily with a meal. 10/03/17  Yes Blue, Olivia C, PA-C  PARoxetine (PAXIL) 20 MG tablet Take 40 mg by mouth daily.   Yes [provider]  acetaminophen (TYLENOL) 500 MG tablet Take 1,000 mg by mouth every 6 (six) hours as needed for mild pain.    [provider]  lisinopril  (PRINIVIL,ZESTRIL) 2.5 MG tablet Take 1 tablet (2.5 mg total) by mouth daily. 10/17/17   Georgetta Haber, NP  meloxicam (MOBIC) 15 MG tablet Take 1 tablet (15 mg total) by mouth daily. 10/17/17   Georgetta Haber, NP  methocarbamol (ROBAXIN) 500 MG tablet Take 1 tablet (500 mg total) by mouth at bedtime as needed for muscle spasms. 10/17/17   Georgetta Haber, NP    Family History History reviewed. No pertinent family history.  Social History Social History   Tobacco Use  . Smoking status: Former Games developer  . Smokeless tobacco: Never Used  Substance Use Topics  . Alcohol use: No  . Drug use: No     Allergies   Amoxicillin and Red dye   Review of Systems Review of Systems   Physical Exam Triage Vital Signs ED Triage Vitals  Enc Vitals Group     BP 10/17/17 1812 122/70     Pulse Rate 10/17/17 1812 80     Resp 10/17/17 1812 18     Temp 10/17/17 1812 98.5 F (36.9 C)     Temp Source 10/17/17 1812 Oral     SpO2 10/17/17 1812 97 %     Weight --  Height --      Head Circumference --      Peak Flow --      Pain Score 10/17/17 1810 7     Pain Loc --      Pain Edu? --      Excl. in GC? --    No data found.  Updated Vital Signs BP 122/70 (BP Location: Left Arm)   Pulse 80   Temp 98.5 F (36.9 C) (Oral)   Resp 18   SpO2 97%   Visual Acuity Right Eye Distance:   Left Eye Distance:   Bilateral Distance:    Right Eye Near:   Left Eye Near:    Bilateral Near:     Physical Exam  Constitutional: He is oriented to person, place, and time. He appears well-developed and well-nourished.  Cardiovascular: Normal rate and regular rhythm.  Pulmonary/Chest: Effort normal and breath sounds normal.  Musculoskeletal:       Lumbar back: He exhibits tenderness and pain. He exhibits normal range of motion, no bony tenderness, no swelling, no edema, no deformity and no laceration.       Back:  Tenderness to palpation to bilateral low back, does not extend into buttocks;  ambulatory. Sensation intact; equal strength to bilateral lower extremities  Neurological: [name] to person, place, and time.  Skin: Skin is warm and dry.     UC Treatments / Results  Labs (all labs ordered are listed, but only abnormal results are displayed) Labs Reviewed - No data to display  EKG  EKG Interpretation None       Radiology No results found.  Procedures Procedures (including critical care time)  Medications Ordered in UC Medications - No data to display   Initial Impression / Assessment and Plan / UC Course  I have reviewed the triage vital signs and the nursing notes.  Pertinent labs & imaging results that were available during my care of the patient were reviewed by me and considered in my medical decision making (see chart for details).     BP WNl tonight, refilled lisinopril tonight. Chem 8 normal 1 month ago. Increased meloxicam, reminded patient to withhold any use of additional antiinflammatories. Heat/ice application. Robaxin may be used at night as needed for spasms. Discussed significance of following up and establishing with a primary care provider for management of chronic illnesses. Patient verbalized understanding and agreeable to plan.    Final Clinical Impressions(s) / UC Diagnoses   Final diagnoses:  Chronic bilateral low back pain without sciatica  Medication refill    ED Discharge Orders        Ordered    lisinopril (PRINIVIL,ZESTRIL) 2.5 MG tablet  Daily     10/17/17 1850    meloxicam (MOBIC) 15 MG tablet  Daily     10/17/17 1850    methocarbamol (ROBAXIN) 500 MG tablet  At bedtime PRN     10/17/17 1850       Controlled Substance Prescriptions [place] Controlled Substance Registry consulted? Not Applicable   Georgetta Haber, NP 10/17/17 [id number]

## 2017-11-17 ENCOUNTER — Other Ambulatory Visit: Payer: Self-pay

## 2017-11-17 ENCOUNTER — Encounter (HOSPITAL_COMMUNITY): Payer: Self-pay | Admitting: Emergency Medicine

## 2017-11-17 ENCOUNTER — Ambulatory Visit (HOSPITAL_COMMUNITY)
Admission: EM | Admit: 2017-11-17 | Discharge: 2017-11-17 | Disposition: A | Discharge status: Home / Self Care | Payer: Self-pay | Attending: Physician Assistant | Admitting: Physician Assistant

## 2017-11-17 DIAGNOSIS — G8929 Other chronic pain: Secondary | ICD-10-CM

## 2017-11-17 DIAGNOSIS — I10 Essential (primary) hypertension: Secondary | ICD-10-CM

## 2017-11-17 DIAGNOSIS — Z760 Encounter for issue of repeat prescription: Secondary | ICD-10-CM

## 2017-11-17 MED ORDER — LISINOPRIL 2.5 MG PO TABS: 2.5000 mg | ORAL_TABLET | Freq: Every day | ORAL | 0 refills | Status: DC

## 2017-11-17 MED ORDER — CYCLOBENZAPRINE HCL 10 MG PO TABS: 10.0000 mg | ORAL_TABLET | Freq: Every day | ORAL | 0 refills | Status: DC

## 2017-11-17 MED ORDER — MELOXICAM 15 MG PO TABS: 15.0000 mg | ORAL_TABLET | Freq: Every day | ORAL | 0 refills | Status: DC

## 2017-11-17 MED ORDER — METHOCARBAMOL 500 MG PO TABS: 500.0000 mg | ORAL_TABLET | Freq: Every evening | ORAL | 0 refills | Status: DC | PRN

## 2017-11-17 NOTE — ED Provider Notes (Addendum)
11/17/2017 7:28 PM   DOB: [birth year] / MRN: [id number]  SUBJECTIVE:  Randall Parsons is a 37 y.o. male presenting for medication refills.  He has been advised multiple times to find a primary care provider however he states he is unable to do this.  He would like a refill of his lisinopril is meloxicam and his methocarbamol.  He takes meloxicam and methocarbamol for chronic back pain.  He takes a low dose of lisinopril for history of hypertension which appears to be very well controlled.  His dad keeps coming here for medication refills.   He is allergic to amoxicillin and red dye.   He  has a past medical history of Exposure to hepatitis B, Exposure to hepatitis C, Hepatitis C, and Renal disorder.    He  reports that he has quit smoking. he has never used smokeless tobacco. He reports that he does not drink alcohol or use drugs. He  has no sexual activity history on file. The patient  has a past surgical history that includes Appendectomy and Eye surgery.  His family history is not on file.  Review of Systems  Constitutional: Negative for chills, diaphoresis and fever.  Eyes: Negative.   Respiratory: Negative for cough, hemoptysis, sputum production, shortness of breath and wheezing.   Cardiovascular: Negative for chest pain, orthopnea and leg swelling.  Gastrointestinal: Negative for nausea.  Musculoskeletal: Positive for back pain and myalgias. Negative for falls, joint pain and neck pain.  Skin: Negative for rash.  Neurological: Negative for dizziness, sensory change, speech change, focal weakness and headaches.    OBJECTIVE: His blood pressure medicine  BP (!) 146/78 (BP Location: Left Arm) Comment: Notified Tina G  Pulse 77   Temp 97.7 F (36.5 C) (Oral)   Resp 20   SpO2 100%   BP Readings from Last 3 Encounters:  11/17/17 (!) 146/78  10/17/17 122/70  10/03/17 122/81     Physical Exam  Constitutional: He is oriented to person, place, and time. He appears well-developed. He  is active and cooperative.  Non-toxic appearance.  Cardiovascular: Normal rate and regular rhythm.  Pulmonary/Chest: Effort normal and breath sounds normal. No tachypnea.  Abdominal: Soft. Bowel sounds are normal.  Musculoskeletal: Normal range of motion.  Neurological: [name] to person, place, and time. No cranial nerve deficit or sensory deficit. Gait normal.  Skin: Skin is warm and dry. He is not diaphoretic. No pallor.  Vitals reviewed.   Lab Results  Component Value Date   CREATININE 1.00 09/17/2017   BUN 16 09/17/2017   NA 139 09/17/2017   K 4.2 09/17/2017   CL 103 09/17/2017   CO2 24 04/12/2017     No results found for this or any previous visit (from the past 72 hour(s)).  No results found.  ASSESSMENT AND PLAN:  No orders of the defined types were placed in this encounter.    Well-controlled hypertension: Refilling lisinopril.  Advised to find a primary care provider.  Other chronic pain: Refilling his meloxicam.  Patient requesting to try Flexeril.  I have stopped the Robaxin.  Patient also requesting gabapentin.  Advised that chronic gabapentin is in appropriate therapy in this setting.  He needs to see a primary care and be followed.  I have strongly encouraged him to find a primary care provider who can manage his chronic issues for him.  I will have the [facility] call his phone and hopefully they can help him get established.  The patient is advised to call or return to clinic if he does not see an improvement in symptoms, or to seek the care of the closest emergency department if he worsens with the above plan.   Deliah Boston, MHS, PA-C 11/17/2017 7:28 PM    Ofilia Neas, PA-C 11/17/17 1930    Ofilia Neas, PA-C 11/17/17 1933

## 2017-11-17 NOTE — Discharge Instructions (Addendum)
Please find a primary care provider they can provide you with refills of these medications for multiple months.

## 2017-11-17 NOTE — ED Triage Notes (Signed)
Pt here for refill of his meds. Takes meloxicam and lisinopril, pt requesting flexeril for muscle pains. Gabapentin for nerve pain in his leg.

## 2017-11-27 ENCOUNTER — Encounter (HOSPITAL_COMMUNITY): Payer: Self-pay | Admitting: Emergency Medicine

## 2017-11-27 ENCOUNTER — Ambulatory Visit (HOSPITAL_COMMUNITY)
Admission: EM | Admit: 2017-11-27 | Discharge: 2017-11-27 | Disposition: A | Discharge status: Home / Self Care | Payer: Medicaid Other | Attending: Family Medicine | Admitting: Family Medicine

## 2017-11-27 ENCOUNTER — Emergency Department (HOSPITAL_COMMUNITY): Payer: Self-pay

## 2017-11-27 ENCOUNTER — Observation Stay (HOSPITAL_COMMUNITY)
Admission: EM | Admit: 2017-11-27 | Discharge: 2017-11-28 | Disposition: A | Discharge status: Home / Self Care | Payer: Self-pay | Attending: Internal Medicine | Admitting: Internal Medicine

## 2017-11-27 ENCOUNTER — Other Ambulatory Visit: Payer: Self-pay

## 2017-11-27 ENCOUNTER — Encounter (HOSPITAL_COMMUNITY): Payer: Self-pay

## 2017-11-27 DIAGNOSIS — Z87891 Personal history of nicotine dependence: Secondary | ICD-10-CM | POA: Insufficient documentation

## 2017-11-27 DIAGNOSIS — R0789 Other chest pain: Secondary | ICD-10-CM

## 2017-11-27 DIAGNOSIS — I200 Unstable angina: Secondary | ICD-10-CM

## 2017-11-27 DIAGNOSIS — I252 Old myocardial infarction: Secondary | ICD-10-CM | POA: Insufficient documentation

## 2017-11-27 DIAGNOSIS — M25561 Pain in right knee: Secondary | ICD-10-CM

## 2017-11-27 DIAGNOSIS — Z791 Long term (current) use of non-steroidal anti-inflammatories (NSAID): Secondary | ICD-10-CM | POA: Insufficient documentation

## 2017-11-27 DIAGNOSIS — Z8619 Personal history of other infectious and parasitic diseases: Secondary | ICD-10-CM | POA: Insufficient documentation

## 2017-11-27 DIAGNOSIS — N200 Calculus of kidney: Secondary | ICD-10-CM

## 2017-11-27 DIAGNOSIS — Z87442 Personal history of urinary calculi: Secondary | ICD-10-CM | POA: Insufficient documentation

## 2017-11-27 DIAGNOSIS — R079 Chest pain, unspecified: Principal | ICD-10-CM | POA: Diagnosis present

## 2017-11-27 DIAGNOSIS — G8929 Other chronic pain: Secondary | ICD-10-CM

## 2017-11-27 DIAGNOSIS — M545 Low back pain, unspecified: Secondary | ICD-10-CM | POA: Diagnosis present

## 2017-11-27 DIAGNOSIS — I428 Other cardiomyopathies: Secondary | ICD-10-CM

## 2017-11-27 DIAGNOSIS — Z79899 Other long term (current) drug therapy: Secondary | ICD-10-CM | POA: Insufficient documentation

## 2017-11-27 DIAGNOSIS — Z23 Encounter for immunization: Secondary | ICD-10-CM | POA: Insufficient documentation

## 2017-11-27 DIAGNOSIS — I110 Hypertensive heart disease with heart failure: Secondary | ICD-10-CM | POA: Insufficient documentation

## 2017-11-27 DIAGNOSIS — Z9102 Food additives allergy status: Secondary | ICD-10-CM | POA: Insufficient documentation

## 2017-11-27 DIAGNOSIS — I10 Essential (primary) hypertension: Secondary | ICD-10-CM | POA: Diagnosis present

## 2017-11-27 DIAGNOSIS — I509 Heart failure, unspecified: Secondary | ICD-10-CM | POA: Insufficient documentation

## 2017-11-27 DIAGNOSIS — Z880 Allergy status to penicillin: Secondary | ICD-10-CM | POA: Insufficient documentation

## 2017-11-27 HISTORY — DX: Heart failure, unspecified: I50.9

## 2017-11-27 HISTORY — DX: Essential (primary) hypertension: I10

## 2017-11-27 LAB — BASIC METABOLIC PANEL
Anion gap: 10 (ref 5–15)
BUN: 15 mg/dL (ref 6–20)
CO2: 23 mmol/L (ref 22–32)
Calcium: 9.4 mg/dL (ref 8.9–10.3)
Chloride: 104 mmol/L (ref 101–111)
Creatinine, Ser: 1.13 mg/dL (ref 0.61–1.24)
GFR calc Af Amer: >60 mL/min (ref >60)
GFR calc non Af Amer: >60 mL/min (ref >60)
Glucose, Bld: 91 mg/dL (ref 65–99)
Potassium: 4.3 mmol/L (ref 3.5–5.1)
Sodium: 137 mmol/L (ref 135–145)

## 2017-11-27 LAB — CBC
HCT: 43.9 % (ref 39.0–52.0)
Hemoglobin: 14.4 g/dL (ref 13.0–17.0)
MCH: 29.4 pg (ref 26.0–34.0)
MCHC: 32.8 g/dL (ref 30.0–36.0)
MCV: 89.6 fL (ref 78.0–100.0)
Platelets: 322 10*3/uL (ref 150–400)
RBC: 4.9 MIL/uL (ref 4.22–5.81)
RDW: 14.5 % (ref 11.5–15.5)
WBC: 7 10*3/uL (ref 4.0–10.5)

## 2017-11-27 LAB — BRAIN NATRIURETIC PEPTIDE: B NATRIURETIC PEPTIDE 5: 6.5 pg/mL (ref 0.0–100.0)

## 2017-11-27 LAB — TROPONIN I

## 2017-11-27 MED ORDER — MORPHINE SULFATE (PF) 4 MG/ML IV SOLN
4.0000 mg | Freq: Once | INTRAVENOUS | Status: AC
  Administered 2017-11-27: 4 mg via INTRAVENOUS
  Filled 2017-11-27: qty 1

## 2017-11-27 MED ORDER — NITROGLYCERIN 0.4 MG SL SUBL
0.4000 mg | SUBLINGUAL_TABLET | SUBLINGUAL | Status: AC | PRN
  Administered 2017-11-27 (×3): 0.4 mg via SUBLINGUAL
  Filled 2017-11-27: qty 1

## 2017-11-27 MED ORDER — GABAPENTIN 300 MG PO CAPS: 300.0000 mg | ORAL_CAPSULE | Freq: Three times a day (TID) | ORAL | 2 refills | Status: DC

## 2017-11-27 MED ORDER — ASPIRIN 81 MG PO CHEW
324.0000 mg | CHEWABLE_TABLET | Freq: Once | ORAL | Status: AC
  Administered 2017-11-27: 324 mg via ORAL
  Filled 2017-11-27: qty 4

## 2017-11-27 NOTE — ED Provider Notes (Signed)
[facility] EMERGENCY DEPARTMENT Provider Note   CSN: [id number] Arrival date & time: 11/27/17  1457     History   Chief Complaint Chief Complaint  Patient presents with  . Chest Pain    HPI Randall Parsons is a 37 y.o. male.  HPI   37 year old male with hx of CHF, Hepatitis C, HTN, renal disease sent here from [facility] for evaluation of CP.  Patient report for the past 6 days he has had recurrent left-sided chest pain.  He describes the pain as a pressure sensation with tightness across his chest associated with shortness of breath, and pain is waxing waning worse in the morning.  Pain has been persistent throughout today currently rates at 7 out of 10.  He endorses dry cough.  He noticed pain is worse with exertion.  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  He has not been follow-up for his heart condition but states that he has been substance free since.  He denies any recent alcohol use.  He does workout on a regular basis but denies any recent injury or increased exercise routine.  He has been taking Tylenol for his pain with some improvement.  He denies fever, chills, headache, lightheadedness, dizziness, abdominal pain, back pain, dysuria, focal numbness or weakness.  Denies history of IVDU.  Patient felt his current pain is different from the pain that he had when he had prior MI.    Past Medical History:  Diagnosis Date  . CHF (congestive heart failure) (HCC)   . Exposure to hepatitis B   . Exposure to hepatitis C   . Hepatitis C   . Hypertension   . Renal disorder    kidney stones    There are no active problems to display for this patient.   Past Surgical History:  Procedure Laterality Date  . APPENDECTOMY    . EYE SURGERY     secondary to dog bite       Home Medications    Prior to Admission medications   Medication Sig Start Date End Date Taking? Authorizing Provider  acetaminophen  (TYLENOL) 500 MG tablet Take 1,000 mg by mouth every 6 (six) hours as needed for mild pain.    [provider]  cyclobenzaprine (FLEXERIL) 10 MG tablet Take 1 tablet (10 mg total) by mouth at bedtime. 11/17/17   Ofilia Neas, PA-C  gabapentin (NEURONTIN) 300 MG capsule Take 1 capsule (300 mg total) by mouth 3 (three) times daily. 11/27/17   Mardella Layman, MD  lisinopril (PRINIVIL,ZESTRIL) 2.5 MG tablet Take 1 tablet (2.5 mg total) by mouth daily. 11/17/17   Ofilia Neas, PA-C  meloxicam (MOBIC) 15 MG tablet Take 1 tablet (15 mg total) by mouth daily. 11/17/17   Ofilia Neas, PA-C  PARoxetine (PAXIL) 20 MG tablet Take 40 mg by mouth daily.    [provider]    Family History History reviewed. No pertinent family history.  Social History Social History   Tobacco Use  . Smoking status: Former Games developer  . Smokeless tobacco: Never Used  Substance Use Topics  . Alcohol use: No  . Drug use: No     Allergies   Amoxicillin and Red dye   Review of Systems Review of Systems  All other systems reviewed and are negative.    Physical Exam Updated Vital Signs BP 114/71 (BP Location: Right Arm)   Pulse 81  Temp 98 F (36.7 C) (Oral)   Resp 16   Ht 5\' 9"  (1.753 m)   Wt 93 kg (205 lb)   SpO2 98%   BMI 30.27 kg/m   Physical Exam  Constitutional: He appears well-developed and well-nourished. No distress.  HENT:  Head: Atraumatic.  Eyes: Conjunctivae are normal.  Neck: Neck supple.  Cardiovascular: Normal rate, regular rhythm, intact distal pulses and normal pulses. Exam reveals no gallop and no friction rub.  No murmur heard. Pulmonary/Chest: Effort normal and breath sounds normal. He has no decreased breath sounds. He has no wheezes. He has no rhonchi. He has no rales.  Abdominal: Soft. There is no tenderness.  Musculoskeletal: Normal range of motion.       Right lower leg: He exhibits no edema.       Left lower leg: He exhibits no edema.    Neurological: He is alert.  Skin: No rash noted.  Psychiatric: He has a normal mood and affect.  Nursing note and vitals reviewed.    ED Treatments / Results  Labs (all labs ordered are listed, but only abnormal results are displayed) Labs Reviewed  BASIC METABOLIC PANEL  CBC  TROPONIN I  BRAIN NATRIURETIC PEPTIDE  RAPID URINE DRUG SCREEN, HOSP PERFORMED  I-STAT TROPONIN, ED    EKG  EKG Interpretation  Date/Time:  Thursday November 27 2017 17:31:05 EST Ventricular Rate:  87 PR Interval:    QRS Duration: 100 QT Interval:  384 QTC Calculation: 462 R Axis:   99 Text Interpretation:  Sinus rhythm Borderline right axis deviation Confirmed by Raeford Razor [id number]) on 11/27/2017 10:03:00 PM       Radiology Dg Chest 2 View  Result Date: 11/27/2017 CLINICAL DATA:  Left-sided chest pain, shortness of breath for 6 days EXAM: CHEST  2 VIEW COMPARISON:  10/12/2012 FINDINGS: The heart size and mediastinal contours are within normal limits. Both lungs are clear. The visualized skeletal structures are unremarkable. IMPRESSION: No active cardiopulmonary disease. Electronically Signed   By: Elige Ko   On: 11/27/2017 15:56    Procedures Procedures (including critical care time)  Medications Ordered in ED Medications  nitroGLYCERIN (NITROSTAT) SL tablet 0.4 mg (0.4 mg Sublingual Given 11/27/17 2350)  aspirin chewable tablet 324 mg (324 mg Oral Given 11/27/17 2207)  morphine 4 MG/ML injection 4 mg (4 mg Intravenous Given 11/27/17 2219)     Initial Impression / Assessment and Plan / ED Course  I have reviewed the triage vital signs and the nursing notes.  Pertinent labs & imaging results that were available during my care of the patient were reviewed by me and considered in my medical decision making (see chart for details).     BP 103/62   Pulse 60   Temp 98 F (36.7 C) (Oral)   Resp 12   Ht 5\' 9"  (1.753 m)   Wt 93 kg (205 lb)   SpO2 100%   BMI 30.27 kg/m    Final  Clinical Impressions(s) / ED Diagnoses   Final diagnoses:  Unstable angina [facility])    ED Discharge Orders    None     9:47 PM Patient with reported history of prior MI here with exertional chest pain shortness of breath which has been intermittently x 6 days.  Hx of prior cocaine induce MI.  Work up initiated.   12:06 AM Pt given SL nitro, morphine, ASA with resolution of pain.  Care discussed with Dr. Juleen China.  Appreciate consultation with [facility] Dr.  Toniann Fail who agrees to see and admit pt for chest pain rule out.     Fayrene Helper, PA-C 11/28/17 0008    Raeford Razor, MD 11/28/17 [phone]

## 2017-11-27 NOTE — ED Notes (Signed)
Pt called for VS. No answer 

## 2017-11-27 NOTE — ED Triage Notes (Signed)
Pt reports 6 days of left sided chest pain that feels like someone is stabbing him with a dull knife.  He states it comes on more after he has been walking but he will get the pain sometimes at rest.  Pt has an extensive history of substance abuse and a hx of CHF

## 2017-11-27 NOTE — ED Notes (Signed)
Greta Doom, PA informed of pt decreased CP. Greta Doom, PA advised this RN to give NTG.

## 2017-11-27 NOTE — ED Notes (Signed)
Pt aware of need for urine sample. This RN placed urinal at bedside.

## 2017-11-27 NOTE — ED Triage Notes (Signed)
Pt presents with 6 day h/o L sided chest pain.  Pt reports pain is intermittent and does not radiate.  +shortness of breath, especially with exertion; h/o MI x 2

## 2017-11-27 NOTE — ED Provider Notes (Signed)
[facility] CARE CENTER   [id number] 11/27/17 Arrival Time: 1325  ASSESSMENT & PLAN:  1. Atypical chest pain   2. Chronic pain of right knee     Meds ordered this encounter  Medications  . gabapentin (NEURONTIN) 300 MG capsule    Sig: Take 1 capsule (300 mg total) by mouth 3 (three) times daily.    Dispense:  90 capsule    Refill:  2   Refill of gabapentin given at his request.  Given his history and inferior inverted T-waves on ECG today (no previous for comparison) he feels more comfortable with ED evaluation today. Currently without CP. Prefers self transport. Stable upon D/C. Copy of ECG given.  Reviewed expectations re: course of current medical issues. Questions answered. Outlined signs and symptoms indicating need for more acute intervention. Patient verbalized understanding. After Visit Summary given.   SUBJECTIVE:  Randall Parsons is a 37 y.o. male who presents with complaint [id number] pain. Onset abrupt, 6 days ago with stable course since that time. Feels daily. Describes discomfort as intermittent and dull in nature over L anterior chest. Discomfort does not radiate. Typically will last up to several hours then slowly resolve. Reports no associated SOB/n/v. Questions if walking triggers pain but also has at rest. No trauma/injury reported. No specific aggravating or alleviating factors that he can think of. 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 No LE edema other than very mild baseline. His old records are with [facility]ospitals. Currently in rehab here at [facility] house. No drug use in 8 months. No self treatment for current symptoms. Former smoker.  ROS: As per HPI.   OBJECTIVE:  Vitals:   11/27/17 1336  BP: 117/77  Pulse: 76  Temp: 98.1 F (36.7 C)  TempSrc: Oral  SpO2: 98%    General appearance: alert; no distress Eyes: PERRLA; EOMI; conjunctiva normal HENT: normocephalic; atraumatic Neck: supple Lungs:  symmetric air entry; no respiratory distress Heart: regular rate and rhythm Chest Wall: non-tender; no reproducible pain Abdomen: soft, non-tender Extremities: mild LE edema; symmetrical with no gross deformities Skin: warm and dry Psychological: alert and cooperative; normal mood and affect   Allergies  Allergen Reactions  . Amoxicillin     HIVES  . Red Dye Hives    Past Medical History:  Diagnosis Date  . CHF (congestive heart failure) (HCC)   . Exposure to hepatitis B   . Exposure to hepatitis C   . Hepatitis C   . Hypertension   . Renal disorder    kidney stones   Social History   Socioeconomic History  . Marital status: Single    Spouse name: Not on file  . Number of children: Not on file  . Years of education: Not on file  . Highest education level: Not on file  Social Needs  . Financial resource strain: Not on file  . Food insecurity - worry: Not on file  . Food insecurity - inability: Not on file  . Transportation needs - medical: Not on file  . Transportation needs - non-medical: Not on file  Occupational History  . Not on file  Tobacco Use  . Smoking status: Former Games developer  . Smokeless tobacco: Never Used  Substance and Sexual Activity  . Alcohol use: No  . Drug use: No  . Sexual activity: Not on file  Other Topics Concern  . Not on file  Social History Narrative  . Not on file    Past  Surgical History:  Procedure Laterality Date  . APPENDECTOMY    . EYE SURGERY     secondary to dog bite     Mardella Layman, MD 11/27/17 [phone]

## 2017-11-28 ENCOUNTER — Observation Stay (HOSPITAL_BASED_OUTPATIENT_CLINIC_OR_DEPARTMENT_OTHER): Payer: Self-pay

## 2017-11-28 ENCOUNTER — Encounter (HOSPITAL_COMMUNITY): Payer: Self-pay | Admitting: *Deleted

## 2017-11-28 DIAGNOSIS — R079 Chest pain, unspecified: Secondary | ICD-10-CM | POA: Diagnosis present

## 2017-11-28 DIAGNOSIS — M545 Low back pain, unspecified: Secondary | ICD-10-CM | POA: Diagnosis present

## 2017-11-28 DIAGNOSIS — N200 Calculus of kidney: Secondary | ICD-10-CM

## 2017-11-28 DIAGNOSIS — I428 Other cardiomyopathies: Secondary | ICD-10-CM

## 2017-11-28 DIAGNOSIS — I10 Essential (primary) hypertension: Secondary | ICD-10-CM | POA: Diagnosis present

## 2017-11-28 LAB — CREATININE, SERUM: Creatinine, Ser: 1.09 mg/dL (ref 0.61–1.24)

## 2017-11-28 LAB — POCT I-STAT TROPONIN I: Troponin i, poc: 0 ng/mL (ref 0.00–0.08)

## 2017-11-28 LAB — CBC
HEMATOCRIT: 41.6 % (ref 39.0–52.0)
Hemoglobin: 13.8 g/dL (ref 13.0–17.0)
MCH: 29.7 pg (ref 26.0–34.0)
MCHC: 33.2 g/dL (ref 30.0–36.0)
MCV: 89.7 fL (ref 78.0–100.0)
Platelets: 271 10*3/uL (ref 150–400)
RBC: 4.64 MIL/uL (ref 4.22–5.81)
RDW: 14.4 % (ref 11.5–15.5)
WBC: 6.3 10*3/uL (ref 4.0–10.5)

## 2017-11-28 LAB — TROPONIN I
Troponin I: <0.03 ng/mL (ref <0.03)
Troponin I: <0.03 ng/mL (ref <0.03)

## 2017-11-28 LAB — MRSA PCR SCREENING: MRSA BY PCR: NEGATIVE

## 2017-11-28 LAB — RAPID URINE DRUG SCREEN, HOSP PERFORMED
AMPHETAMINES: NOT DETECTED
BENZODIAZEPINES: NOT DETECTED
Barbiturates: NOT DETECTED
Cocaine: NOT DETECTED
Opiates: POSITIVE — AB
TETRAHYDROCANNABINOL: NOT DETECTED

## 2017-11-28 LAB — ECHOCARDIOGRAM COMPLETE
Height: 69 in
WEIGHTICAEL: 3022.95 [oz_av]

## 2017-11-28 LAB — HIV ANTIBODY (ROUTINE TESTING W REFLEX): HIV SCREEN 4TH GENERATION: NONREACTIVE

## 2017-11-28 LAB — D-DIMER, QUANTITATIVE (NOT AT ARMC)

## 2017-11-28 MED ORDER — HYDRALAZINE HCL 20 MG/ML IJ SOLN: 10.0000 mg | INTRAMUSCULAR | Status: DC | PRN

## 2017-11-28 MED ORDER — GABAPENTIN 300 MG PO CAPS
300.0000 mg | ORAL_CAPSULE | Freq: Three times a day (TID) | ORAL | Status: DC
  Administered 2017-11-28 (×2): 300 mg via ORAL
  Filled 2017-11-28 (×2): qty 1

## 2017-11-28 MED ORDER — ASPIRIN 81 MG PO TBEC: 81.0000 mg | DELAYED_RELEASE_TABLET | Freq: Every day | ORAL | 0 refills | Status: DC

## 2017-11-28 MED ORDER — ASPIRIN EC 325 MG PO TBEC: 325.0000 mg | DELAYED_RELEASE_TABLET | Freq: Every day | ORAL | Status: DC

## 2017-11-28 MED ORDER — ASPIRIN EC 81 MG PO TBEC
81.0000 mg | DELAYED_RELEASE_TABLET | Freq: Every day | ORAL | Status: DC
  Administered 2017-11-28: 81 mg via ORAL
  Filled 2017-11-28: qty 1

## 2017-11-28 MED ORDER — CYCLOBENZAPRINE HCL 10 MG PO TABS: 10.0000 mg | ORAL_TABLET | Freq: Every day | ORAL | Status: DC

## 2017-11-28 MED ORDER — NITROGLYCERIN 0.4 MG SL SUBL
0.4000 mg | SUBLINGUAL_TABLET | SUBLINGUAL | Status: DC | PRN
  Administered 2017-11-28 (×2): 0.4 mg via SUBLINGUAL

## 2017-11-28 MED ORDER — ONDANSETRON HCL 4 MG/2ML IJ SOLN: 4.0000 mg | Freq: Four times a day (QID) | INTRAMUSCULAR | Status: DC | PRN

## 2017-11-28 MED ORDER — ENOXAPARIN SODIUM 40 MG/0.4ML ~~LOC~~ SOLN
40.0000 mg | SUBCUTANEOUS | Status: DC
  Filled 2017-11-28: qty 0.4

## 2017-11-28 MED ORDER — MORPHINE SULFATE (PF) 2 MG/ML IV SOLN: 1.0000 mg | INTRAVENOUS | Status: DC | PRN

## 2017-11-28 MED ORDER — ACETAMINOPHEN 325 MG PO TABS
650.0000 mg | ORAL_TABLET | ORAL | Status: DC | PRN
  Administered 2017-11-28: 650 mg via ORAL
  Filled 2017-11-28: qty 2

## 2017-11-28 MED ORDER — PNEUMOCOCCAL VAC POLYVALENT 25 MCG/0.5ML IJ INJ
0.5000 mL | INJECTION | INTRAMUSCULAR | Status: AC | PRN
  Administered 2017-11-28: 0.5 mL via INTRAMUSCULAR
  Filled 2017-11-28: qty 0.5

## 2017-11-28 MED ORDER — PNEUMOCOCCAL VAC POLYVALENT 25 MCG/0.5ML IJ INJ: 0.5000 mL | INJECTION | INTRAMUSCULAR | Status: DC

## 2017-11-28 MED ORDER — MORPHINE SULFATE (PF) 2 MG/ML IV SOLN
1.0000 mg | INTRAVENOUS | Status: AC
  Administered 2017-11-28: 1 mg via INTRAVENOUS

## 2017-11-28 MED ORDER — PAROXETINE HCL 20 MG PO TABS
40.0000 mg | ORAL_TABLET | Freq: Every day | ORAL | Status: DC
  Administered 2017-11-28: 40 mg via ORAL
  Filled 2017-11-28: qty 2

## 2017-11-28 MED ORDER — NITROGLYCERIN 0.4 MG SL SUBL
SUBLINGUAL_TABLET | SUBLINGUAL | Status: AC
  Administered 2017-11-28: 0.4 mg via SUBLINGUAL
  Filled 2017-11-28: qty 1

## 2017-11-28 MED ORDER — MORPHINE SULFATE (PF) 2 MG/ML IV SOLN
INTRAVENOUS | Status: AC
  Administered 2017-11-28: 1 mg via INTRAVENOUS
  Filled 2017-11-28: qty 1

## 2017-11-28 NOTE — Discharge Summary (Signed)
Physician Discharge Summary  Patient ID: Randall Parsons MRN: [id number] DOB/AGE: May 13, 1981 37 y.o.  Admit date: 11/27/2017 Discharge date: 11/28/2017  Admission Diagnoses:  Discharge Diagnoses:  Principal Problem:   Chest pain Active Problems:   Essential hypertension   Low back pain   NICM (nonischemic cardiomyopathy) (HCC)   Nephrolithiasis   Discharged Condition: stable  Brief history: Patient is a 37 year old Caucasian male with past medical history significant for hypertension, chronic low back pain, previous history of ACS in the setting of cocaine abuse followed up at [facility].  Patient presented with chest pain.   Hospital Course:   1. Chest pain -patient was admitted for further assessment and management.  Troponin was cycled.  Cardiology was consulted.  Cardiac workup came back none revealing.  The cardiology team is cleared patient for discharge.  The troponin has remained stable.  Echo finding is as documented below.    2. Hypertension on lisinopril.    This was optimized.  3. History of chronic low back pain. 4. History of polysubstance abuse has not used any for the last 8 months.  Urine drug screen is only positive for opiates.  Consults: cardiology.  Cardiology team has cleared patient for discharge.  Significant Diagnostic Studies: Troponin came back negative.  Echo revealed concentric hypertrophy. Systolic function was normal. The   estimated ejection fraction was in the range of 60% to 65%. Wall motion was normal; there were no regional wall motion   abnormalities.  Right ventricle: The cavity size was mildly dilated.  Pericardium, extracardiac: A trivial pericardial effusion was identified  Discharge Exam: Blood pressure 118/66, pulse 78, temperature 98.3 F (36.8 C), temperature source Oral, resp. rate 20, height 5\' 9"  (1.753 m), weight 85.7 kg (188 lb 15 oz), SpO2 97 %.  Disposition: 01-Home or Self Care  Discharge Instructions    Call MD for:    Complete by:  As directed    Please call MD if symptoms worsen.   Diet - low sodium heart healthy   Complete by:  As directed    Increase activity slowly   Complete by:  As directed      Allergies as of 11/28/2017      Reactions   Amoxicillin    HIVES   Red Dye Hives      Medication List    STOP taking these medications   acetaminophen 500 MG tablet Commonly known as:  TYLENOL   hydrOXYzine 25 MG tablet Commonly known as:  ATARAX/VISTARIL   meloxicam 15 MG tablet Commonly known as:  MOBIC     TAKE these medications   aspirin 81 MG EC tablet Take 1 tablet (81 mg total) by mouth daily. Start taking on:  11/29/2017   cyclobenzaprine 10 MG tablet Commonly known as:  FLEXERIL Take 1 tablet (10 mg total) by mouth at bedtime.   gabapentin 300 MG capsule Commonly known as:  NEURONTIN Take 1 capsule (300 mg total) by mouth 3 (three) times daily.   lisinopril 2.5 MG tablet Commonly known as:  PRINIVIL,ZESTRIL Take 1 tablet (2.5 mg total) by mouth daily.   PARoxetine 20 MG tablet Commonly known as:  PAXIL Take 40 mg by mouth daily.        SignedBarnetta Chapel 11/28/2017, 3:25 PM

## 2017-11-28 NOTE — Progress Notes (Signed)
Pt. Received all discharge paperwork and had all belongings with him at discharge.

## 2017-11-28 NOTE — ED Notes (Signed)
Attempted to call report

## 2017-11-28 NOTE — Plan of Care (Signed)
Pt Adequate for discharge.

## 2017-11-28 NOTE — Progress Notes (Signed)
  Echocardiogram 2D Echocardiogram has been performed.  Tully Burgo T Terrez Ander 11/28/2017, 10:47 AM

## 2017-11-28 NOTE — Progress Notes (Signed)
Pt given 2 SL nitroglycerin without relief of pain.  Pt refusing third nitroglycerin, with c/o headache associated with nitroglycerin administration.  Pt states "the morphine earlier helped a lot."

## 2017-11-28 NOTE — Consult Note (Addendum)
Cardiology Consultation:   Patient ID: Randall Parsons; [id number]; 1980-11-12   Admit date: 11/27/2017 Date of Consult: 11/28/2017  Primary Care Provider: Patient, No Pcp Per Primary Cardiologist: Dr Luvenia Heller   Patient Profile:   Randall Parsons is a 37 y.o. male with a hx of NICM who is being seen today for the evaluation of chest pain at the request of Dr Dartha Lodge.  History of Present Illness:   Randall Parsons is a 37 y/o male who was evaluated for CHF in March 2017 at [facility]. He had an EF of 40-45% by echo then in the setting of cocaine use. He was placed on Lisinopril. As far as I can tell no other work up (cath or stress) was done. The pt has no PCP. He comes to the ED each month for medication refills. He denies current drug use and is currently a resident of [address] and working at the [employer]. He presented to the ED 11/27/17 with complaints of localized Lt chest pain. He says he has been having this pain for the past 4-5 days. He describes it as an "ache". Its worse when he is up walking. He denies any radiation to his jaw, back, or arms. No apparent change with deep inspiration. He can point to a specific spot, he says it has moved around but always Lt precordial area. His Troponin are negative x 4, EKG NSR, NSST changes.   Past Medical History:  Diagnosis Date  . CHF (congestive heart failure) (HCC)   . Exposure to hepatitis B   . Exposure to hepatitis C   . Hepatitis C   . Hypertension   . Renal disorder    kidney stones    Past Surgical History:  Procedure Laterality Date  . APPENDECTOMY    . EYE SURGERY     secondary to dog bite     Home Medications:  Prior to Admission medications   Medication Sig Start Date End Date Taking? Authorizing Provider  acetaminophen (TYLENOL) 500 MG tablet Take 1,000 mg by mouth every 6 (six) hours as needed for mild pain.   Yes [provider]  cyclobenzaprine (FLEXERIL) 10 MG tablet Take 1 tablet (10 mg total)  by mouth at bedtime. 11/17/17  Yes Ofilia Neas, PA-C  gabapentin (NEURONTIN) 300 MG capsule Take 1 capsule (300 mg total) by mouth 3 (three) times daily. 11/27/17  Yes Mardella Layman, MD  hydrOXYzine (ATARAX/VISTARIL) 25 MG tablet Take 25 mg by mouth daily as needed.   Yes [provider]  lisinopril (PRINIVIL,ZESTRIL) 2.5 MG tablet Take 1 tablet (2.5 mg total) by mouth daily. 11/17/17  Yes Ofilia Neas, PA-C  meloxicam (MOBIC) 15 MG tablet Take 1 tablet (15 mg total) by mouth daily. 11/17/17  Yes Ofilia Neas, PA-C  PARoxetine (PAXIL) 20 MG tablet Take 40 mg by mouth daily.   Yes [provider]    Inpatient Medications: Scheduled Meds: . aspirin EC  325 mg Oral Daily  . cyclobenzaprine  10 mg Oral QHS  . enoxaparin (LOVENOX) injection  40 mg Subcutaneous Q24H  . gabapentin  300 mg Oral TID  . PARoxetine  40 mg Oral Daily  . [START ON 11/29/2017] pneumococcal 23 valent vaccine  0.5 mL Intramuscular Tomorrow-1000   Continuous Infusions:  PRN Meds: acetaminophen, hydrALAZINE, morphine injection, nitroGLYCERIN, ondansetron (ZOFRAN) IV  Allergies:    Allergies  Allergen Reactions  . Amoxicillin     HIVES  . Red Dye Hives  Social History:   Social History   Socioeconomic History  . Marital status: Single    Spouse name: Not on file  . Number of children: Not on file  . Years of education: Not on file  . Highest education level: Not on file  Social Needs  . Financial resource strain: Not on file  . Food insecurity - worry: Not on file  . Food insecurity - inability: Not on file  . Transportation needs - medical: Not on file  . Transportation needs - non-medical: Not on file  Occupational History  . Not on file  Tobacco Use  . Smoking status: Former Games developer  . Smokeless tobacco: Never Used  Substance and Sexual Activity  . Alcohol use: No  . Drug use: No  . Sexual activity: Not on file  Other Topics Concern  . Not on file  Social History  Narrative  . Not on file    Family History:    Family History  Problem Relation Age of Onset  . Hypertension Father      ROS:  Please see the history of present illness.  Chronic back pain All other ROS reviewed and negative.     Physical Exam/Data:   Vitals:   11/28/17 0132 11/28/17 0152 11/28/17 0307 11/28/17 0751  BP: (!) 98/58  117/78 108/66  Pulse: 65  (!) 59 76  Resp: 14  13 14   Temp:   97.8 F (36.6 C) (!) 97.3 F (36.3 C)  TempSrc:   Oral Oral  SpO2: 95%  100% 100%  Weight:  188 lb 15 oz (85.7 kg)    Height:  5\' 9"  (1.753 m)     No intake or output data in the 24 hours ending 11/28/17 0810 Filed Weights   11/27/17 1515 11/28/17 0152  Weight: 205 lb (93 kg) 188 lb 15 oz (85.7 kg)   Body mass index is 27.9 kg/m.  General:  Well nourished, well developed, in no acute distress HEENT: normal Lymph: no adenopathy Neck: no JVD Endocrine:  No thryomegaly Vascular: No carotid bruits; FA pulses 2+ bilaterally without bruits  Cardiac:  normal S1, S2; RRR; no murmur  Lungs:  clear to auscultation bilaterally, no wheezing, rhonchi or rales  Abd: soft, nontender, no hepatomegaly  Ext: no edema Musculoskeletal:  No deformities, BUE and BLE strength normal and equal Skin: warm and dry  Neuro:  CNs 2-12 intact, no focal abnormalities noted Psych:  Normal affect   EKG:  The EKG was personally reviewed and demonstrates:  NSR Telemetry:  Telemetry was personally reviewed and demonstrates:  NSR   Laboratory Data:  Chemistry Recent Labs  Lab 11/27/17 1518 11/28/17 0438  NA 137  --   K 4.3  --   CL 104  --   CO2 23  --   GLUCOSE 91  --   BUN 15  --   CREATININE 1.13 1.09  CALCIUM 9.4  --   GFRNONAA >60 >60  GFRAA >60 >60  ANIONGAP 10  --     No results for input(s): PROT, ALBUMIN, AST, ALT, ALKPHOS, BILITOT in the last 168 hours. Hematology Recent Labs  Lab 11/27/17 1518 11/28/17 0438  WBC 7.0 6.3  RBC 4.90 4.64  HGB 14.4 13.8  HCT 43.9 41.6  MCV  89.6 89.7  MCH 29.4 29.7  MCHC 32.8 33.2  RDW 14.5 14.4  PLT 322 271   Cardiac Enzymes Recent Labs  Lab 11/27/17 2210 11/28/17 0121 11/28/17 0438  TROPONINI <0.03 <0.03 <  0.03   No results for input(s): TROPIPOC in the last 168 hours.  BNP Recent Labs  Lab 11/27/17 2210  BNP 6.5    DDimer  Recent Labs  Lab 11/28/17 0438  DDIMER <0.27    Radiology/Studies:  Dg Chest 2 View  Result Date: 11/27/2017 CLINICAL DATA:  Left-sided chest pain, shortness of breath for 6 days EXAM: CHEST  2 VIEW COMPARISON:  10/12/2012 FINDINGS: The heart size and mediastinal contours are within normal limits. Both lungs are clear. The visualized skeletal structures are unremarkable. IMPRESSION: No active cardiopulmonary disease. Electronically Signed   By: Elige Ko   On: 11/27/2017 15:56    Assessment and Plan:   Chest pain Atypical for angina, MI r/o by Troponin  NICM EF was 40-45% in 2017, will await echo. If his EF is depressed consider work up for ischemia while he is in the hospital  HTN- On ACE-controlled  Chronic back pain  Plan: MD to see, awaiting echo results before considering ischemic work up.    For questions or updates, please contact [facility] Please consult [email or web address] for contact info under Cardiology/STEMI.   Jolene Provost, PA-C  11/28/2017 8:10 AM   Attending Note:   The patient was seen and examined.  Agree with assessment and plan as noted above.  Changes made to the above note as needed.  Patient seen and independently examined with Randall Shelter, PA .   We discussed all aspects of the encounter. I agree with the assessment and plan as stated above.  1.  Chest discomfort: The patient presents with very atypical chest pains.  These are not consistent with an acute coronary syndrome.  His EKG is nonacute.  Troponin levels are negative. He has been clean from cocaine for many months.  He is currently in the [facility]  He has a history of a  cardiomyopathy but does not have any symptoms of congestive heart failure at this time.  We will get an echocardiogram.  If his left ventricular systolic function is normal then I do not think that he needs any additional workup at this time.  2.  History of mildly depressed left ventricular systolic function.  This is information from [facility].  He has a history of cocaine abuse and had chronic systolic congestive heart failure presumably due to cocaine abuse.  He is now been clean for many months. He is been on lisinopril 2.5 mg a day.  Is not ordered as an inpatient-I presume due to mild hypotension. We will get an echocardiogram today.  If his EF is normal then I do not think he needs anything further and I suspect that his cardiomyopathy was due to his cocaine abuse.  If his ejection fraction is low then will proceed with Myoview stress testing tomorrow.  No further cardiac issues.  If his echocardiogram is unremarkable then he may be discharged later today and we will sign off.  He does not have a general medical doctor.  I have given him information about the health and wellness clinic.   I have spent a total of 40 minutes with patient reviewing hospital  notes , telemetry, EKGs, labs and examining patient as well as establishing an assessment and plan that was discussed with the patient. > 50% of time was spent in direct patient care.    Vesta Mixer, Montez Hageman., MD, [facility] 11/28/2017, 8:32 AM [address]  [address] Office [phone] Pager [phone]

## 2017-11-28 NOTE — Care Management Note (Signed)
Case Management Note  Patient Details  Name: Randall Parsons MRN: [id number] Date of Birth: 04-27-1981   Subjective/Objective:       Chest pain             Action/Plan: Spoke to pt and he currently does not have any insurance or PCP. Provided pt with brochure for [facility] to call and arrange follow up appt. Pt reports having Lisinopril to take at home.    Expected Discharge Date:  11/28/17               Expected Discharge Plan:  Home/Self Care  In-House Referral:  NA  Discharge planning Services  CM Consult  Post Acute Care Choice:  NA Choice offered to:  NA  DME Arranged:  N/A DME Agency:  NA  HH Arranged:  NA HH Agency:  NA  Status of Service:  Completed, signed off  If discussed at Long Length of Stay Meetings, dates discussed:    Additional Comments:  Elliot Cousin, RN 11/28/2017, 3:44 PM

## 2017-11-28 NOTE — Progress Notes (Signed)
Pt arrived to floor w/ complaints of chest pain.  Pt states "chest pain is different than before." "earlier my pain was in the center of my chest, now its around my left side under my armpit."  Pt denies pain with palpation, no increased pain with inspiration per pt.  Pt states pain is constant.  Pt states "the nitro helped a earlier." Dr. Toniann Fail, MD notified.  Orders received.

## 2017-11-28 NOTE — H&P (Signed)
History and Physical    Randall Parsons WUJ:[id number] DOB: December 28, 1980 DOA: 11/27/2017  PCP: Patient, No Pcp Per  Patient coming from: Home.  Chief Complaint: Chest pain.  HPI: Randall Parsons is a 38 y.o. male with history of hypertension, chronic low back pain previous history of ACS in the setting of cocaine abuse followed up at [facility] presents to the ER with complaints of chest pain.  Patient states he has been having chest pain off and on for last 6 days but past evening it became more persistent.  Pain was usually on exertion.  Denies any shortness of breath or productive cough fever or chills.  Pain is mostly in the left anterior axillary line.  Pressure-like nonradiating.  Denies any nausea vomiting abdominal pain.  Has had previous history of polysubstance abuse and has not used any for last 8 months.  ED Course: In the ER chest x-ray EKG and cardiac markers were negative.  Patient was given morphine nitroglycerin following the chest pain improved.  Patient is being admitted for further management.  The time of my exam patient chest pain recurred.  Repeat EKG does not show anything acute.  Troponin is pending.  Review of Systems: As per HPI, rest all negative.   Past Medical History:  Diagnosis Date  . CHF (congestive heart failure) (HCC)   . Exposure to hepatitis B   . Exposure to hepatitis C   . Hepatitis C   . Hypertension   . Renal disorder    kidney stones    Past Surgical History:  Procedure Laterality Date  . APPENDECTOMY    . EYE SURGERY     secondary to dog bite     reports that he has quit smoking. he has never used smokeless tobacco. He reports that he does not drink alcohol or use drugs.  Allergies  Allergen Reactions  . Amoxicillin     HIVES  . Red Dye Hives    Family History  Problem Relation Age of Onset  . Hypertension Father     Prior to Admission medications   Medication Sig Start Date End Date Taking? Authorizing Provider  acetaminophen  (TYLENOL) 500 MG tablet Take 1,000 mg by mouth every 6 (six) hours as needed for mild pain.   Yes [provider]  cyclobenzaprine (FLEXERIL) 10 MG tablet Take 1 tablet (10 mg total) by mouth at bedtime. 11/17/17  Yes Ofilia Neas, PA-C  gabapentin (NEURONTIN) 300 MG capsule Take 1 capsule (300 mg total) by mouth 3 (three) times daily. 11/27/17  Yes Mardella Layman, MD  hydrOXYzine (ATARAX/VISTARIL) 25 MG tablet Take 25 mg by mouth daily as needed.   Yes [provider]  lisinopril (PRINIVIL,ZESTRIL) 2.5 MG tablet Take 1 tablet (2.5 mg total) by mouth daily. 11/17/17  Yes Ofilia Neas, PA-C  meloxicam (MOBIC) 15 MG tablet Take 1 tablet (15 mg total) by mouth daily. 11/17/17  Yes Ofilia Neas, PA-C  PARoxetine (PAXIL) 20 MG tablet Take 40 mg by mouth daily.   Yes [provider]    Physical Exam: Vitals:   11/28/17 0123 11/28/17 0132 11/28/17 0152 11/28/17 0307  BP: 112/61 (!) 98/58  117/78  Pulse: 62 65  (!) 59  Resp: 10 14  13   Temp:    97.8 F (36.6 C)  TempSrc:    Oral  SpO2: 96% 95%  100%  Weight:   85.7 kg (188 lb 15 oz)   Height:   5\' 9"  (1.753 m)  Constitutional: Moderately built and nourished. Vitals:   11/28/17 0123 11/28/17 0132 11/28/17 0152 11/28/17 0307  BP: 112/61 (!) 98/58  117/78  Pulse: 62 65  (!) 59  Resp: 10 14  13   Temp:    97.8 F (36.6 C)  TempSrc:    Oral  SpO2: 96% 95%  100%  Weight:   85.7 kg (188 lb 15 oz)   Height:   5\' 9"  (1.753 m)    Eyes: Anicteric no pallor. ENMT: No discharge from the ears eyes nose or mouth. Neck: No mass felt but no JVD appreciated. Respiratory: No rhonchi or crepitations. Cardiovascular: S1-S2 heard no murmurs appreciated. Abdomen: Soft nontender bowel sounds present. Musculoskeletal: No edema.  No joint effusion. Skin: No rash.  Skin appears warm. Neurologic: Alert awake oriented to time place and person.  Moves all extremities. Psychiatric: Appears normal.  Normal  affect.   Labs on Admission: I have personally reviewed following labs and imaging studies  CBC: Recent Labs  Lab 11/27/17 1518  WBC 7.0  HGB 14.4  HCT 43.9  MCV 89.6  PLT 322   Basic Metabolic Panel: Recent Labs  Lab 11/27/17 1518  NA 137  K 4.3  CL 104  CO2 23  GLUCOSE 91  BUN 15  CREATININE 1.13  CALCIUM 9.4   GFR: Estimated Creatinine Clearance: 96.2 mL/min (by C-G formula based on SCr of 1.13 mg/dL). Liver Function Tests: No results for input(s): AST, ALT, ALKPHOS, BILITOT, PROT, ALBUMIN in the last 168 hours. No results for input(s): LIPASE, AMYLASE in the last 168 hours. No results for input(s): AMMONIA in the last 168 hours. Coagulation Profile: No results for input(s): INR, PROTIME in the last 168 hours. Cardiac Enzymes: Recent Labs  Lab 11/27/17 2210 11/28/17 0121  TROPONINI <0.03 <0.03   BNP (last 3 results) No results for input(s): PROBNP in the last 8760 hours. HbA1C: No results for input(s): HGBA1C in the last 72 hours. CBG: No results for input(s): GLUCAP in the last 168 hours. Lipid Profile: No results for input(s): CHOL, HDL, LDLCALC, TRIG, CHOLHDL, LDLDIRECT in the last 72 hours. Thyroid Function Tests: No results for input(s): TSH, T4TOTAL, FREET4, T3FREE, THYROIDAB in the last 72 hours. Anemia Panel: No results for input(s): VITAMINB12, FOLATE, FERRITIN, TIBC, IRON, RETICCTPCT in the last 72 hours. Urine analysis:    Component Value Date/Time   COLORURINE STRAW (A) 04/12/2017 2215   APPEARANCEUR CLEAR 04/12/2017 2215   LABSPEC 1.011 04/12/2017 2215   PHURINE 5.0 04/12/2017 2215   GLUCOSEU NEGATIVE 04/12/2017 2215   HGBUR NEGATIVE 04/12/2017 2215   BILIRUBINUR NEGATIVE 04/12/2017 2215   KETONESUR NEGATIVE 04/12/2017 2215   PROTEINUR NEGATIVE 04/12/2017 2215   UROBILINOGEN 0.2 11/13/2010 1815   NITRITE NEGATIVE 04/12/2017 2215   LEUKOCYTESUR NEGATIVE 04/12/2017 2215   Sepsis Labs: @LABRCNTIP (procalcitonin:4,lacticidven:4) )No  results found for this or any previous visit (from the past 240 hour(s)).   Radiological Exams on Admission: Dg Chest 2 View  Result Date: 11/27/2017 CLINICAL DATA:  Left-sided chest pain, shortness of breath for 6 days EXAM: CHEST  2 VIEW COMPARISON:  10/12/2012 FINDINGS: The heart size and mediastinal contours are within normal limits. Both lungs are clear. The visualized skeletal structures are unremarkable. IMPRESSION: No active cardiopulmonary disease. Electronically Signed   By: Elige Ko   On: 11/27/2017 15:56    EKG: Independently reviewed.  Normal sinus rhythm with nonspecific ST changes.  Assessment/Plan Principal Problem:   Chest pain Active Problems:   Essential hypertension  Low back pain    1. Chest pain -given the previous history of ACS we will cycle cardiac markers to rule out.  Check d-dimer.  Check 2D echo patient is on aspirin morphine and as needed nitroglycerin.  Cardiology consult requested. 2. Hypertension on lisinopril.  For now since patient has been to be n.p.o. we have placed patient on PRN IV hydralazine. 3. History of chronic low back pain. 4. History of polysubstance abuse has not used any for the last 8 months.  Urine drug screen is only positive for opiates.   DVT prophylaxis: Lovenox. Code Status: Full code. Family Communication: Discussed with patient. Disposition Plan: Home. Consults called: Cardiology. Admission status: Observation.   Eduard Clos MD [facility] Pager [phone].  If 7PM-7AM, please contact night-coverage [email or web address] Password [email or web address]  11/28/2017, 4:11 AM

## 2017-12-14 ENCOUNTER — Emergency Department (HOSPITAL_COMMUNITY)
Admission: EM | Admit: 2017-12-14 | Discharge: 2017-12-14 | Disposition: A | Discharge status: Home / Self Care | Payer: Medicaid Other | Attending: Emergency Medicine | Admitting: Emergency Medicine

## 2017-12-14 ENCOUNTER — Other Ambulatory Visit: Payer: Self-pay

## 2017-12-14 ENCOUNTER — Emergency Department (HOSPITAL_COMMUNITY): Payer: Medicaid Other

## 2017-12-14 ENCOUNTER — Encounter (HOSPITAL_COMMUNITY): Payer: Self-pay | Admitting: *Deleted

## 2017-12-14 DIAGNOSIS — S41132A Puncture wound without foreign body of left upper arm, initial encounter: Secondary | ICD-10-CM | POA: Insufficient documentation

## 2017-12-14 DIAGNOSIS — I10 Essential (primary) hypertension: Secondary | ICD-10-CM | POA: Insufficient documentation

## 2017-12-14 DIAGNOSIS — Z79899 Other long term (current) drug therapy: Secondary | ICD-10-CM | POA: Insufficient documentation

## 2017-12-14 DIAGNOSIS — Y999 Unspecified external cause status: Secondary | ICD-10-CM | POA: Insufficient documentation

## 2017-12-14 DIAGNOSIS — W540XXA Bitten by dog, initial encounter: Secondary | ICD-10-CM | POA: Insufficient documentation

## 2017-12-14 DIAGNOSIS — Y9289 Other specified places as the place of occurrence of the external cause: Secondary | ICD-10-CM | POA: Insufficient documentation

## 2017-12-14 DIAGNOSIS — Z7982 Long term (current) use of aspirin: Secondary | ICD-10-CM | POA: Insufficient documentation

## 2017-12-14 DIAGNOSIS — Z23 Encounter for immunization: Secondary | ICD-10-CM | POA: Insufficient documentation

## 2017-12-14 DIAGNOSIS — Z87891 Personal history of nicotine dependence: Secondary | ICD-10-CM | POA: Insufficient documentation

## 2017-12-14 DIAGNOSIS — Y9389 Activity, other specified: Secondary | ICD-10-CM | POA: Insufficient documentation

## 2017-12-14 MED ORDER — RABIES IMMUNE GLOBULIN 150 UNIT/ML IM INJ
20.0000 [IU]/kg | INJECTION | Freq: Once | INTRAMUSCULAR | Status: DC
  Filled 2017-12-14: qty 12

## 2017-12-14 MED ORDER — TETANUS-DIPHTH-ACELL PERTUSSIS 5-2.5-18.5 LF-MCG/0.5 IM SUSP
0.5000 mL | Freq: Once | INTRAMUSCULAR | Status: AC
  Administered 2017-12-14: 0.5 mL via INTRAMUSCULAR
  Filled 2017-12-14: qty 0.5

## 2017-12-14 MED ORDER — DOXYCYCLINE HYCLATE 100 MG PO TABS: 100.0000 mg | ORAL_TABLET | Freq: Once | ORAL | Status: DC

## 2017-12-14 MED ORDER — SULFAMETHOXAZOLE-TRIMETHOPRIM 800-160 MG PO TABS
1.0000 | ORAL_TABLET | Freq: Once | ORAL | Status: DC
  Filled 2017-12-14: qty 1

## 2017-12-14 MED ORDER — DOXYCYCLINE HYCLATE 100 MG PO TABS
100.0000 mg | ORAL_TABLET | Freq: Once | ORAL | Status: AC
  Administered 2017-12-14: 100 mg via ORAL
  Filled 2017-12-14: qty 1

## 2017-12-14 MED ORDER — IBUPROFEN 600 MG PO TABS: 600.0000 mg | ORAL_TABLET | Freq: Four times a day (QID) | ORAL | 0 refills | Status: DC | PRN

## 2017-12-14 MED ORDER — LIDOCAINE HCL (PF) 1 % IJ SOLN: 10.0000 mL | Freq: Once | INTRAMUSCULAR | Status: DC

## 2017-12-14 MED ORDER — RABIES VACCINE, PCEC IM SUSR
1.0000 mL | Freq: Once | INTRAMUSCULAR | Status: DC
  Filled 2017-12-14: qty 1

## 2017-12-14 MED ORDER — DOXYCYCLINE HYCLATE 100 MG PO CAPS: 100.0000 mg | ORAL_CAPSULE | Freq: Two times a day (BID) | ORAL | 0 refills | Status: DC

## 2017-12-14 NOTE — Discharge Instructions (Addendum)
Take antibiotics as directed. Please take all of your antibiotics until finished.  You can take Tylenol or Ibuprofen as directed for pain. You can alternate Tylenol and Ibuprofen every 4 hours. If you take Tylenol at 1pm, then you can take Ibuprofen at 5pm. Then you can take Tylenol again at 9pm.   Keep the wound clean and dry.  Follow the RICE (Rest, Ice, Compression, Elevation) protocol as directed.   As we discussed, you need to either follow-up with the emergency department or urgent care in 2 days to have the wound recheck.  Return to emergency department sooner for any fever, worsening redness or swelling, streaking up the arm, redness or swelling that extends outside the marked lines, numbness/weakness of the hands or any other worsening or concerning symptoms.

## 2017-12-14 NOTE — ED Provider Notes (Signed)
[facility] EMERGENCY DEPARTMENT Provider Note   CSN: [id number] Arrival date & time: 12/14/17  1618     History   Chief Complaint Chief Complaint  Patient presents with  . Animal Bite    HPI Randall Parsons is a 37 y.o. male who presents for evaluation of dog bite to the left upper extremity that occurred approximately 2 days ago.  Patient reports that he works at a [employer] and had a dog that bit his left upper extremity.  Patient reports that he immediately cleaned out the wounds.  Patient reports that since the incident, he has had continued warmth, redness spreading down the left upper extremity.  He reports he has not had any fever.  He reports associated pain to the area.  He is not taking the for the pain.  Patient reports that the dog is up-to-date on vaccines.  Patient denies any numbness/weakness, fevers.   The history is provided by the patient.    Past Medical History:  Diagnosis Date  . CHF (congestive heart failure) (HCC)   . Exposure to hepatitis B   . Exposure to hepatitis C   . Hepatitis C   . Hypertension   . Renal disorder    kidney stones    Patient Active Problem List   Diagnosis Date Noted  . Chest pain 11/28/2017  . Essential hypertension 11/28/2017  . Low back pain 11/28/2017  . NICM (nonischemic cardiomyopathy) (HCC) 11/28/2017  . Nephrolithiasis 11/28/2017    Past Surgical History:  Procedure Laterality Date  . APPENDECTOMY    . EYE SURGERY     secondary to dog bite       Home Medications    Prior to Admission medications   Medication Sig Start Date End Date Taking? Authorizing Provider  aspirin EC 81 MG EC tablet Take 1 tablet (81 mg total) by mouth daily. 11/29/17   Barnetta Chapel, MD  cyclobenzaprine (FLEXERIL) 10 MG tablet Take 1 tablet (10 mg total) by mouth at bedtime. 11/17/17   Ofilia Neas, PA-C  doxycycline (VIBRAMYCIN) 100 MG capsule Take 1 capsule (100 mg total) by mouth 2 (two) times daily.  12/14/17   Maxwell Caul, PA-C  gabapentin (NEURONTIN) 300 MG capsule Take 1 capsule (300 mg total) by mouth 3 (three) times daily. 11/27/17   Mardella Layman, MD  ibuprofen (ADVIL,MOTRIN) 600 MG tablet Take 1 tablet (600 mg total) by mouth every 6 (six) hours as needed. 12/14/17   Maxwell Caul, PA-C  lisinopril (PRINIVIL,ZESTRIL) 2.5 MG tablet Take 1 tablet (2.5 mg total) by mouth daily. 11/17/17   Ofilia Neas, PA-C  PARoxetine (PAXIL) 20 MG tablet Take 40 mg by mouth daily.    [provider]    Family History Family History  Problem Relation Age of Onset  . Hypertension Father     Social History Social History   Tobacco Use  . Smoking status: Former Games developer  . Smokeless tobacco: Never Used  Substance Use Topics  . Alcohol use: No  . Drug use: No     Allergies   Amoxicillin and Red dye   Review of Systems Review of Systems  Constitutional: Negative for fever.  Skin: Positive for color change and wound.  Neurological: Negative for weakness and numbness.     Physical Exam Updated Vital Signs BP 125/83 (BP Location: Right Arm)   Pulse 87   Temp 98.1 F (36.7 C) (Oral)   Resp 20   Ht 5'  9" (1.753 m)   Wt 90.7 kg (200 lb)   SpO2 97%   BMI 29.53 kg/m   Physical Exam  Constitutional: He appears well-developed and well-nourished.  HENT:  Head: Normocephalic and atraumatic.  Eyes: Conjunctivae and EOM are normal. Right eye exhibits no discharge. Left eye exhibits no discharge. No scleral icterus.  Cardiovascular:  Pulses:      Radial pulses are 2+ on the right side, and 2+ on the left side.  Pulmonary/Chest: Effort normal.  Musculoskeletal:  Tenderness palpation to the left forearm where  wounds are.  Mild overlying soft tissue swelling. Compartments are soft.  Neurological: He is alert.  Sensation intact along major nerve distributions of BUE  Skin: Skin is warm and dry. Capillary refill takes less than 2 seconds.  Diffuse warmth, erythema,  and induration with two small puncture wounds noted to the left upper extremity.  No lymphangitis noted.  No streaking.  Psychiatric: He has a normal mood and affect. His speech is normal and behavior is normal.  Nursing note and vitals reviewed.          ED Treatments / Results  Labs (all labs ordered are listed, but only abnormal results are displayed) Labs Reviewed - No data to display  EKG  EKG Interpretation None       Radiology Dg Forearm Left  Result Date: 12/14/2017 CLINICAL DATA:  37 year old male with dog bite to the left upper extremity. EXAM: LEFT FOREARM - 2 VIEW COMPARISON:  None. FINDINGS: There is no evidence of fracture or other focal bone lesions. Soft tissues are unremarkable. IMPRESSION: Negative. Electronically Signed   By: Elgie Collard M.D.   On: 12/14/2017 19:26    Procedures Procedures (including critical care time)  Medications Ordered in ED Medications  Tdap (BOOSTRIX) injection 0.5 mL (0.5 mLs Intramuscular Given 12/14/17 1922)  doxycycline (VIBRA-TABS) tablet 100 mg (100 mg Oral Given 12/14/17 1940)     Initial Impression / Assessment and Plan / ED Course  I have reviewed the triage vital signs and the nursing notes.  Pertinent labs & imaging results that were available during my care of the patient were reviewed by me and considered in my medical decision making (see chart for details).     37 y.o. male who presents for evaluation of dog bite to left upper extremity that occurred 2 days ago.  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  He reports that he washed out the wound thoroughly after the incident but states that since then has had worsening redness, swelling to the arm.  No fevers.  No numbness/weakness. Patient is afebrile, non-toxic appearing, sitting comfortably on examination table. Vital signs reviewed and stable. Patient is neurovascularly intact.  On exam, patient has 2  puncture wounds overlying the left upper extremity.  There is some surrounding warmth, erythema, induration noted.  Compartments are soft.  No evidence of lymphangitis, streaking.  Suspect that this is cellulitis.  Will plan to update patient's tetanus here in the ED.  Plan for x-ray evaluation to ensure there is no bony abnormalities.  Will start patient on antibiotic therapy.  I discussed with patient regarding rabies vaccines.  Patient states that he knows that the dog is up-to-date on rabies vaccines.  I discussed with him risk first benefits of declining rabies vaccine, including but not limited to worsening condition, death.  Patient wishes declined rabies vaccine at this time.  Patient exhibits full medical decision-making  capacity.  X-ray reviewed.  There is no bony abnormality.  Discussed results with patient.  I discussed with pharmacy regarding antibiotic choices.  Patient is allergic to amoxicillin.  Patient reports he was exposed to hepatitis but has no history of hepatitis.  His past LFTs have been unremarkable.  Additionally I talked with pharmacy regarding choosing Bactrim.  Patient is on Paxil could result in prolonged QTC with additive effect of the 2 medications.  Pharmacy recommends doing doxycycline.  Patient does have an allergy to red dye but pharmacy stores that this should not interfere with doxycycline choice.  Instructed patient to follow-up either in the emergency department or with urgent care in 2 days for evaluation and wound recheck.  Instructed him to take antibiotics as directed.  Wound care instructions discussed with patient.  The area of redness was marked and patient told to return to the emergency department if the redness or swelling extends outside the marker.  Patient had ample opportunity for questions and discussion. All patient's questions were answered with full understanding. Strict return precautions discussed. Patient expresses understanding and agreement to  plan.    Final Clinical Impressions(s) / ED Diagnoses   Final diagnoses:  Dog bite, initial encounter    ED Discharge Orders        Ordered    doxycycline (VIBRAMYCIN) 100 MG capsule  2 times daily     12/14/17 1930    ibuprofen (ADVIL,MOTRIN) 600 MG tablet  Every 6 hours PRN     12/14/17 1930       Maxwell Caul, PA-C 12/15/17 0035    Benjiman Core, MD 12/17/17 0710

## 2017-12-14 NOTE — ED Triage Notes (Signed)
The pt was bitten by a [place] she[place]ard at the [facility] where he works on Friday  He has healing puncture wounds to his lt forearm with redness swelling and pain  The dog had his shots

## 2017-12-26 ENCOUNTER — Encounter (HOSPITAL_COMMUNITY): Payer: Self-pay | Admitting: Emergency Medicine

## 2017-12-26 ENCOUNTER — Other Ambulatory Visit: Payer: Self-pay

## 2017-12-26 ENCOUNTER — Emergency Department (HOSPITAL_COMMUNITY)
Admission: EM | Admit: 2017-12-26 | Discharge: 2017-12-26 | Disposition: A | Discharge status: Home / Self Care | Payer: Medicaid Other | Attending: Emergency Medicine | Admitting: Emergency Medicine

## 2017-12-26 DIAGNOSIS — I110 Hypertensive heart disease with heart failure: Secondary | ICD-10-CM | POA: Insufficient documentation

## 2017-12-26 DIAGNOSIS — I509 Heart failure, unspecified: Secondary | ICD-10-CM | POA: Insufficient documentation

## 2017-12-26 DIAGNOSIS — J111 Influenza due to unidentified influenza virus with other respiratory manifestations: Secondary | ICD-10-CM

## 2017-12-26 DIAGNOSIS — Z7982 Long term (current) use of aspirin: Secondary | ICD-10-CM | POA: Insufficient documentation

## 2017-12-26 DIAGNOSIS — Z79899 Other long term (current) drug therapy: Secondary | ICD-10-CM | POA: Insufficient documentation

## 2017-12-26 DIAGNOSIS — Z87891 Personal history of nicotine dependence: Secondary | ICD-10-CM | POA: Insufficient documentation

## 2017-12-26 MED ORDER — NAPROXEN 500 MG PO TABS: 500.0000 mg | ORAL_TABLET | Freq: Two times a day (BID) | ORAL | 0 refills | Status: AC | PRN

## 2017-12-26 MED ORDER — NAPROXEN 375 MG PO TABS: 375.0000 mg | ORAL_TABLET | Freq: Two times a day (BID) | ORAL | 0 refills | Status: DC | PRN

## 2017-12-26 MED ORDER — ONDANSETRON 4 MG PO TBDP: 4.0000 mg | ORAL_TABLET | Freq: Three times a day (TID) | ORAL | 0 refills | Status: DC | PRN

## 2017-12-26 MED ORDER — NAPROXEN 250 MG PO TABS
500.0000 mg | ORAL_TABLET | Freq: Once | ORAL | Status: AC
  Administered 2017-12-26: 500 mg via ORAL
  Filled 2017-12-26: qty 2

## 2017-12-26 MED ORDER — DIPHENHYDRAMINE HCL 25 MG PO CAPS
25.0000 mg | ORAL_CAPSULE | Freq: Once | ORAL | Status: AC
  Administered 2017-12-26: 25 mg via ORAL
  Filled 2017-12-26: qty 1

## 2017-12-26 MED ORDER — OSELTAMIVIR PHOSPHATE 75 MG PO CAPS: 75.0000 mg | ORAL_CAPSULE | Freq: Two times a day (BID) | ORAL | 0 refills | Status: DC

## 2017-12-26 MED ORDER — METOCLOPRAMIDE HCL 10 MG PO TABS
10.0000 mg | ORAL_TABLET | Freq: Once | ORAL | Status: AC
  Administered 2017-12-26: 10 mg via ORAL
  Filled 2017-12-26: qty 1

## 2017-12-26 MED ORDER — LISINOPRIL 2.5 MG PO TABS: 2.5000 mg | ORAL_TABLET | Freq: Every day | ORAL | 0 refills | Status: DC

## 2017-12-26 NOTE — ED Triage Notes (Signed)
Last night began to have chills, body aches, headache, no appetite and loose stools and "no energy."

## 2017-12-26 NOTE — ED Provider Notes (Signed)
[facility] EMERGENCY DEPARTMENT Provider Note   CSN: [id number] Arrival date & time: 12/26/17  5621     History   Chief Complaint Chief Complaint  Patient presents with  . flu like symptoms    HPI Randall Parsons is a 37 y.o. male.  HPI   37 year old male with past medical history as below including hepatitis C here with fever, body aches, nausea, and headache.  The patient has multiple sick contacts at his current residence with influenza.  He states that last night, after church, he developed acute onset of chills and body aches.  He states he began to feel cramping, achy pain in his bilateral arms and legs.  He also developed a mild, generalized, throbbing, frontal headache.  He had associated nausea but no vomiting.  Denies any cough.  No nasal congestion or rhinorrhea.  No abdominal pain or vomiting or diarrhea, only nausea.  He states that "everything hurts" including "his here."  He has not measured his temperature but said felt like he has a fever.  Denies any preceding symptoms prior to yesterday.  The symptoms began fairly acutely.  No recent IV drug use or other medical complaints. He has been trying APAP without relief.  Past Medical History:  Diagnosis Date  . CHF (congestive heart failure) (HCC)   . Exposure to hepatitis B   . Exposure to hepatitis C   . Hepatitis C   . Hypertension   . Renal disorder    kidney stones    Patient Active Problem List   Diagnosis Date Noted  . Chest pain 11/28/2017  . Essential hypertension 11/28/2017  . Low back pain 11/28/2017  . NICM (nonischemic cardiomyopathy) (HCC) 11/28/2017  . Nephrolithiasis 11/28/2017    Past Surgical History:  Procedure Laterality Date  . APPENDECTOMY    . EYE SURGERY     secondary to dog bite       Home Medications    Prior to Admission medications   Medication Sig Start Date End Date Taking? Authorizing Provider  aspirin EC 81 MG EC tablet Take 1 tablet (81 mg total) by  mouth daily. 11/29/17   Barnetta Chapel, MD  cyclobenzaprine (FLEXERIL) 10 MG tablet Take 1 tablet (10 mg total) by mouth at bedtime. 11/17/17   Ofilia Neas, PA-C  doxycycline (VIBRAMYCIN) 100 MG capsule Take 1 capsule (100 mg total) by mouth 2 (two) times daily. 12/14/17   Maxwell Caul, PA-C  gabapentin (NEURONTIN) 300 MG capsule Take 1 capsule (300 mg total) by mouth 3 (three) times daily. 11/27/17   Mardella Layman, MD  ibuprofen (ADVIL,MOTRIN) 600 MG tablet Take 1 tablet (600 mg total) by mouth every 6 (six) hours as needed. 12/14/17   Maxwell Caul, PA-C  lisinopril (PRINIVIL,ZESTRIL) 2.5 MG tablet Take 1 tablet (2.5 mg total) by mouth daily. 11/17/17   Ofilia Neas, PA-C  PARoxetine (PAXIL) 20 MG tablet Take 40 mg by mouth daily.    [provider]    Family History Family History  Problem Relation Age of Onset  . Hypertension Father     Social History Social History   Tobacco Use  . Smoking status: Former Games developer  . Smokeless tobacco: Never Used  Substance Use Topics  . Alcohol use: No  . Drug use: No     Allergies   Amoxicillin and Red dye   Review of Systems Review of Systems  Constitutional: Positive for chills, fatigue and fever.  Gastrointestinal: Positive for  nausea.  Musculoskeletal: Positive for arthralgias and myalgias.  Neurological: Positive for headaches.  All other systems reviewed and are negative.    Physical Exam Updated Vital Signs BP 129/71 (BP Location: Right Arm)   Pulse 94   Temp 97.9 F (36.6 C) (Oral)   Resp 16   Ht 5\' 9"  (1.753 m)   Wt 90.7 kg (200 lb)   SpO2 96%   BMI 29.53 kg/m   Physical Exam  Constitutional: He is oriented to person, place, and time. He appears well-developed and well-nourished. No distress.  HENT:  Head: Normocephalic and atraumatic.  Mouth/Throat: Oropharynx is clear and moist.  Mild posterior pharyngeal erythema without exudates  Eyes: Pupils are equal, round, and reactive to  light. Conjunctivae are normal.  Neck: Neck supple.  Supple, no rigidity, no tenderness  Cardiovascular: Normal rate, regular rhythm and normal heart sounds. Exam reveals no friction rub.  No murmur heard. Pulmonary/Chest: Effort normal and breath sounds normal. No respiratory distress. He has no wheezes. He has no rales.  Abdominal: Soft. Bowel sounds are normal. He exhibits no distension. There is no tenderness. There is no rebound and no guarding.  Musculoskeletal: He exhibits no edema.  Neurological: [name] to person, place, and time. He has normal strength. He exhibits normal muscle tone. Gait normal. GCS eye subscore is 4. GCS verbal subscore is 5. GCS motor subscore is 6.  Skin: Skin is warm. Capillary refill takes less than 2 seconds.  Psychiatric: He has a normal mood and affect.  Nursing note and vitals reviewed.    ED Treatments / Results  Labs (all labs ordered are listed, but only abnormal results are displayed) Labs Reviewed - No data to display  EKG  EKG Interpretation None       Radiology No results found.  Procedures Procedures (including critical care time)  Medications Ordered in ED Medications  metoCLOPramide (REGLAN) tablet 10 mg (has no administration in time range)  diphenhydrAMINE (BENADRYL) capsule 25 mg (has no administration in time range)  naproxen (NAPROSYN) tablet 500 mg (has no administration in time range)     Initial Impression / Assessment and Plan / ED Course  I have reviewed the triage vital signs and the nursing notes.  Pertinent labs & imaging results that were available during my care of the patient were reviewed by me and considered in my medical decision making (see chart for details).     37 year old male here with generalized body aches, chills, and nausea.  Symptoms are consistent with influenza and he has multiple sick contacts with similar illness.  His vital signs are stable here.  He has no cough, no  shortness of breath, and is satting well on room air without evidence of pneumonia.  He complains of a mild generalized headache which I suspect is due to his fever and influenza and he has no neck stiffness, neck rigidity, photophobia, or symptoms to suggest meningitis or encephalitis.  He has a remote history of polysubstance abuse but has no murmur, no signs of endocarditis, and symptoms began acutely with known viral contacts and is more consistent with acute viral illness.  Will treat symptomatically, start empiric Tamiflu, and I gave strict return precautions including any signs of developing bacterial illness.  Patient updated and in agreement with this plan.  Work note provided.  This note was prepared with assistance of Conservation officer, historic buildings. Occasional wrong-word or sound-a-like substitutions may have occurred due to the inherent limitations of  voice recognition software.   Final Clinical Impressions(s) / ED Diagnoses   Final diagnoses:  Influenza    ED Discharge Orders    None       Shaune Pollack, MD 12/26/17 [phone]

## 2017-12-26 NOTE — ED Notes (Signed)
D/c reviewed with teach back. Patient has no further questions at this time

## 2017-12-26 NOTE — ED Notes (Signed)
ED Provider at bedside. 

## 2017-12-26 NOTE — ED Notes (Signed)
E-signature not available at this time 

## 2018-01-25 ENCOUNTER — Encounter (HOSPITAL_COMMUNITY): Payer: Self-pay | Admitting: Emergency Medicine

## 2018-01-25 ENCOUNTER — Ambulatory Visit (HOSPITAL_COMMUNITY)
Admission: EM | Admit: 2018-01-25 | Discharge: 2018-01-25 | Disposition: A | Discharge status: Home / Self Care | Payer: Medicaid Other | Attending: Internal Medicine | Admitting: Internal Medicine

## 2018-01-25 DIAGNOSIS — Z9109 Other allergy status, other than to drugs and biological substances: Secondary | ICD-10-CM

## 2018-01-25 DIAGNOSIS — Z7982 Long term (current) use of aspirin: Secondary | ICD-10-CM | POA: Insufficient documentation

## 2018-01-25 DIAGNOSIS — T7840XA Allergy, unspecified, initial encounter: Secondary | ICD-10-CM

## 2018-01-25 DIAGNOSIS — R490 Dysphonia: Secondary | ICD-10-CM

## 2018-01-25 DIAGNOSIS — J029 Acute pharyngitis, unspecified: Secondary | ICD-10-CM

## 2018-01-25 LAB — POCT RAPID STREP A: Streptococcus, Group A Screen (Direct): NEGATIVE

## 2018-01-25 MED ORDER — CETIRIZINE HCL 10 MG PO TABS: 10.0000 mg | ORAL_TABLET | Freq: Every day | ORAL | 11 refills | Status: DC

## 2018-01-25 MED ORDER — LORATADINE 10 MG PO TABS: 10.0000 mg | ORAL_TABLET | Freq: Every day | ORAL | 11 refills | Status: DC

## 2018-01-25 MED ORDER — CYCLOBENZAPRINE HCL 10 MG PO TABS: 10.0000 mg | ORAL_TABLET | Freq: Every day | ORAL | 0 refills | Status: DC

## 2018-01-25 NOTE — ED Provider Notes (Addendum)
MRN: [id number] DOB: 02/21/1981  Subjective:   Randall Parsons is a 37 y.o. male presenting for 2-day history of sore throat, swelling of his tonsils.  Patient denies fever, sinus congestion, sinus pain, ear pain, ear drainage, tooth pain, cough.  Patient has a history of allergies to pollen but admits that he does not take any allergy medicine.  Denies smoking cigarettes.  States that he hydrates aggressively every day.  Works at a [employer].  No current facility-administered medications for this encounter.   Current Outpatient Medications:  .  aspirin EC 81 MG EC tablet, Take 1 tablet (81 mg total) by mouth daily., Disp: 30 tablet, Rfl: 0 .  cyclobenzaprine (FLEXERIL) 10 MG tablet, Take 1 tablet (10 mg total) by mouth at bedtime., Disp: 30 tablet, Rfl: 0 .  gabapentin (NEURONTIN) 300 MG capsule, Take 1 capsule (300 mg total) by mouth 3 (three) times daily., Disp: 90 capsule, Rfl: 2 .  ibuprofen (ADVIL,MOTRIN) 600 MG tablet, Take 1 tablet (600 mg total) by mouth every 6 (six) hours as needed., Disp: 30 tablet, Rfl: 0 .  lisinopril (PRINIVIL,ZESTRIL) 2.5 MG tablet, Take 1 tablet (2.5 mg total) by mouth daily., Disp: 30 tablet, Rfl: 0 .  ondansetron (ZOFRAN ODT) 4 MG disintegrating tablet, Take 1 tablet (4 mg total) by mouth every 8 (eight) hours as needed for nausea or vomiting., Disp: 20 tablet, Rfl: 0 .  PARoxetine (PAXIL) 20 MG tablet, Take 40 mg by mouth daily., Disp: , Rfl:    Allergies  Allergen Reactions  . Amoxicillin     HIVES  . Red Dye Hives    Past Medical History:  Diagnosis Date  . CHF (congestive heart failure) (HCC)   . Exposure to hepatitis B   . Exposure to hepatitis C   . Hepatitis C   . Hypertension   . Renal disorder    kidney stones     Past Surgical History:  Procedure Laterality Date  . APPENDECTOMY    . EYE SURGERY     secondary to dog bite    Objective:   Vitals: BP 126/78   Pulse 74   Temp 97.6 F (36.4 C)   Resp 16   SpO2 100%    Physical Exam  Constitutional: He is oriented to person, place, and time. He appears well-developed and well-nourished.  HENT:  TMs opaque but without erythema bilaterally.  Throat with significant postnasal drainage and cobblestone pattern.  Nasal turbinates boggy and edematous.  Left nasal passage minimally patent.  Eyes: Right eye exhibits no discharge. Left eye exhibits no discharge. No scleral icterus.  Neck: Normal range of motion. Neck supple.  Cervical lymph node tenderness bilaterally.  Cardiovascular: Normal rate.  Pulmonary/Chest: Effort normal.  Lymphadenopathy:    He has no cervical adenopathy.  Neurological: [name] to person, place, and time.  Skin: Skin is warm and dry.  Psychiatric: He has a normal mood and affect.    Results for orders placed or performed during the hospital encounter of 01/25/18 (from the past 24 hour(s))  POCT rapid strep A [facility])     Status: None   Collection Time: 01/25/18  2:17 PM  Result Value Ref Range   Streptococcus, Group A Screen (Direct) NEGATIVE NEGATIVE    Assessment and Plan :   Sore throat  Hoarseness of voice  Environmental allergies  Counseled patient on need to maintain allergy treatment medications with Zyrtec and Claritin.  Patient is to maintain his hydration.  Use supportive care otherwise including Tylenol ibuprofen, honey-based tea.  Strep culture pending.  At the end of his visit, patient requested refill for Flexeril while he tries to get his appointment with a new PCP.  Courtesy refill provided to bridge his appointment.   Wallis Bamberg, PA-C 01/25/18 1515

## 2018-01-25 NOTE — Discharge Instructions (Signed)
Hydrate well with at least 2 liters (1 gallon) of water daily. For sore throat try using a honey-based tea. Use 3 teaspoons of honey with juice squeezed from half lemon. Place shaved pieces of ginger into 1/2-1 cup of water and warm over stove top. Then mix the ingredients and repeat every 4 hours as needed. You may take 500mg  Tylenol with ibuprofen 400-600mg  every 6 hours for pain and inflammation.

## 2018-01-25 NOTE — ED Triage Notes (Signed)
Pt c/o sore throat x2 days with swollen tonsils.

## 2018-01-28 LAB — CULTURE, GROUP A STREP (THRC)

## 2018-07-17 ENCOUNTER — Emergency Department: Payer: Medicaid Other

## 2018-07-17 ENCOUNTER — Other Ambulatory Visit: Payer: Self-pay

## 2018-07-17 ENCOUNTER — Encounter: Payer: Self-pay | Admitting: Physician Assistant

## 2018-07-17 ENCOUNTER — Emergency Department
Admission: EM | Admit: 2018-07-17 | Discharge: 2018-07-17 | Disposition: A | Discharge status: Home / Self Care | Payer: Medicaid Other | Attending: Emergency Medicine | Admitting: Emergency Medicine

## 2018-07-17 DIAGNOSIS — I110 Hypertensive heart disease with heart failure: Secondary | ICD-10-CM | POA: Insufficient documentation

## 2018-07-17 DIAGNOSIS — Y929 Unspecified place or not applicable: Secondary | ICD-10-CM | POA: Insufficient documentation

## 2018-07-17 DIAGNOSIS — W228XXA Striking against or struck by other objects, initial encounter: Secondary | ICD-10-CM | POA: Insufficient documentation

## 2018-07-17 DIAGNOSIS — Y939 Activity, unspecified: Secondary | ICD-10-CM | POA: Insufficient documentation

## 2018-07-17 DIAGNOSIS — Y999 Unspecified external cause status: Secondary | ICD-10-CM | POA: Insufficient documentation

## 2018-07-17 DIAGNOSIS — Z7982 Long term (current) use of aspirin: Secondary | ICD-10-CM | POA: Insufficient documentation

## 2018-07-17 DIAGNOSIS — I509 Heart failure, unspecified: Secondary | ICD-10-CM | POA: Insufficient documentation

## 2018-07-17 DIAGNOSIS — Z79899 Other long term (current) drug therapy: Secondary | ICD-10-CM | POA: Insufficient documentation

## 2018-07-17 DIAGNOSIS — S92514A Nondisplaced fracture of proximal phalanx of right lesser toe(s), initial encounter for closed fracture: Secondary | ICD-10-CM | POA: Insufficient documentation

## 2018-07-17 DIAGNOSIS — Z87891 Personal history of nicotine dependence: Secondary | ICD-10-CM | POA: Insufficient documentation

## 2018-07-17 MED ORDER — HYDROCODONE-IBUPROFEN 5-200 MG PO TABS: 1.0000 | ORAL_TABLET | Freq: Three times a day (TID) | ORAL | 0 refills | Status: DC | PRN

## 2018-07-17 MED ORDER — HYDROCODONE-IBUPROFEN 5-200 MG PO TABS: 1.0000 | ORAL_TABLET | Freq: Three times a day (TID) | ORAL | 0 refills | Status: AC | PRN

## 2018-07-17 NOTE — ED Notes (Signed)
Right fourth and fifth toes buddy taped. Post op shoe applied.

## 2018-07-17 NOTE — Discharge Instructions (Signed)
Your exam and x-ray confirms a pinky toe fracture. Wear the post-op shoe for comfort and support. Buddy tape the toes for splinting. Take the pain medicine as needed. Follow-up with Dr. Orland Jarred for further fracture care.

## 2018-07-17 NOTE — ED Triage Notes (Signed)
Pt states hit right fifth toe on bookshelf last pm. Bruising noted.

## 2018-07-17 NOTE — ED Provider Notes (Signed)
[facility] Emergency Department Provider Note ____________________________________________  Time seen: 2000  I have reviewed the triage vital signs and the nursing notes.  HISTORY  Chief Complaint  Toe Injury  HPI Randall Parsons is a 37 y.o. male presents himself to the ED for evaluation of pain and bruising to the right fifth toe.  He admits to accidentally hitting or stubbing his toe on a bookshelf last night.  He presents now for further evaluation of his toe pain.  He denies any other injury at this time.  Past Medical History:  Diagnosis Date  . CHF (congestive heart failure) (HCC)   . Exposure to hepatitis B   . Exposure to hepatitis C   . Hepatitis C   . Hypertension   . Renal disorder    kidney stones    Patient Active Problem List   Diagnosis Date Noted  . Chest pain 11/28/2017  . Essential hypertension 11/28/2017  . Low back pain 11/28/2017  . NICM (nonischemic cardiomyopathy) (HCC) 11/28/2017  . Nephrolithiasis 11/28/2017    Past Surgical History:  Procedure Laterality Date  . APPENDECTOMY    . EYE SURGERY     secondary to dog bite    Prior to Admission medications   Medication Sig Start Date End Date Taking? Authorizing Provider  aspirin EC 81 MG EC tablet Take 1 tablet (81 mg total) by mouth daily. 11/29/17   Barnetta Chapel, MD  hydrocodone-ibuprofen (VICOPROFEN) 5-200 MG tablet Take 1 tablet by mouth every 8 (eight) hours as needed for up to 3 days for pain. 07/17/18 07/20/18  Alexy Bringle, Charlesetta Ivory, PA-C  lisinopril (PRINIVIL,ZESTRIL) 2.5 MG tablet Take 1 tablet (2.5 mg total) by mouth daily. 12/26/17   Shaune Pollack, MD  loratadine (CLARITIN) 10 MG tablet Take 1 tablet (10 mg total) by mouth daily. 01/25/18   Wallis Bamberg, PA-C  PARoxetine (PAXIL) 20 MG tablet Take 40 mg by mouth daily.    [provider]    Allergies Amoxicillin and Red dye  Family History  Problem Relation Age of Onset  . Hypertension Father      Social History Social History   Tobacco Use  . Smoking status: Former Games developer  . Smokeless tobacco: Never Used  Substance Use Topics  . Alcohol use: No  . Drug use: No    Review of Systems  Constitutional: Negative for fever. Cardiovascular: Negative for chest pain. Musculoskeletal: Negative for back pain.  Right fifth toe pain as above. Skin: Negative for rash.  Neurological: Negative for headaches, focal weakness or numbness. ____________________________________________  PHYSICAL EXAM:  VITAL SIGNS: ED Triage Vitals [07/17/18 1930]  Enc Vitals Group     BP (!) 142/82     Pulse Rate (!) 104     Resp 16     Temp 98.6 F (37 C)     Temp Source Oral     SpO2 100 %     Weight 195 lb (88.5 kg)     Height 5\' 9"  (1.753 m)     Head Circumference      Peak Flow      Pain Score 9     Pain Loc      Pain Edu?      Excl. in GC?     Constitutional: Alert and oriented. Well appearing and in no distress. Head: Normocephalic and atraumatic. Eyes: Conjunctivae are normal. Normal extraocular movements Cardiovascular: Normal rate, regular rhythm. Normal distal pulses. Respiratory: Normal respiratory effort. No wheezes/rales/rhonchi. Musculoskeletal:  Right foot with early ecchymosis to the medial aspect of the pinky toe. No obvious deformity noted. Nontender with normal range of motion in all extremities.  Neurologic:  Normal gait without ataxia. Normal speech and language. No gross focal neurologic deficits are appreciated. Skin:  Skin is warm, dry and intact. No rash noted. ____________________________________________   RADIOLOGY  Right Foot  IMPRESSION: Nondisplaced fracture of the fifth proximal phalanx. ____________________________________________  PROCEDURES  Procedures Post-op Shoe ____________________________________________  INITIAL IMPRESSION / ASSESSMENT AND PLAN / ED COURSE  Patient with initial fracture management of a non-displaced fracture of the  proximal phalanx of the right 5th toe. He is placed in a post-op shoe for comfort and the toes are buddy-taped. He is referred to podiatry for further fracture management. A prescription for Vicoprofen is provided for pain relief.   I reviewed the patient's prescription history over the last 12 months in the multi-state controlled substances database(s) that includes [place], [place], [place], [place], [place], [place], [place], [place], [place], [place], [place], [place], [place], and [place].  Results were notable for no narcotic pain medicine prescrpitions.  ____________________________________________  FINAL CLINICAL IMPRESSION(S) / ED DIAGNOSES  Final diagnoses:  Closed nondisplaced fracture of proximal phalanx of lesser toe of right foot, initial encounter      Lissa Hoard, PA-C 07/17/18 2108    Rockne Menghini, MD 07/17/18 2333

## 2018-07-18 NOTE — ED Provider Notes (Signed)
Patient return to the emergency department because of the pain prescription prescribed yesterday the pharmacy did not stop.  I went spoke to the patient.  No appreciable swelling of the foot at this time.  The discussed importance of continued use postop shoe.  Given the patient does have an acute fracture of his bone will prescribe pain medication.  I did check the [facility] drug database with did not see any concerning findings.  Discussed sedating medication precautions.   Phineas Semen, MD 07/18/18 1007

## 2018-08-22 ENCOUNTER — Emergency Department
Admission: EM | Admit: 2018-08-22 | Discharge: 2018-08-22 | Disposition: A | Discharge status: Home / Self Care | Payer: Medicaid Other | Attending: Emergency Medicine | Admitting: Emergency Medicine

## 2018-08-22 ENCOUNTER — Emergency Department: Payer: Medicaid Other

## 2018-08-22 ENCOUNTER — Other Ambulatory Visit: Payer: Self-pay

## 2018-08-22 DIAGNOSIS — I110 Hypertensive heart disease with heart failure: Secondary | ICD-10-CM | POA: Insufficient documentation

## 2018-08-22 DIAGNOSIS — Z7982 Long term (current) use of aspirin: Secondary | ICD-10-CM | POA: Insufficient documentation

## 2018-08-22 DIAGNOSIS — R079 Chest pain, unspecified: Secondary | ICD-10-CM

## 2018-08-22 DIAGNOSIS — I509 Heart failure, unspecified: Secondary | ICD-10-CM | POA: Insufficient documentation

## 2018-08-22 DIAGNOSIS — F1410 Cocaine abuse, uncomplicated: Secondary | ICD-10-CM | POA: Insufficient documentation

## 2018-08-22 DIAGNOSIS — Z79899 Other long term (current) drug therapy: Secondary | ICD-10-CM | POA: Insufficient documentation

## 2018-08-22 DIAGNOSIS — Z87891 Personal history of nicotine dependence: Secondary | ICD-10-CM | POA: Insufficient documentation

## 2018-08-22 LAB — CBC
HEMATOCRIT: 46.7 % (ref 39.0–52.0)
Hemoglobin: 15.7 g/dL (ref 13.0–17.0)
MCH: 29.8 pg (ref 26.0–34.0)
MCHC: 33.6 g/dL (ref 30.0–36.0)
MCV: 88.8 fL (ref 80.0–100.0)
PLATELETS: 316 10*3/uL (ref 150–400)
RBC: 5.26 MIL/uL (ref 4.22–5.81)
RDW: 14.1 % (ref 11.5–15.5)
WBC: 12 10*3/uL — ABNORMAL HIGH (ref 4.0–10.5)
nRBC: 0 % (ref 0.0–0.2)

## 2018-08-22 LAB — BASIC METABOLIC PANEL
Anion gap: 16 — ABNORMAL HIGH (ref 5–15)
BUN: 11 mg/dL (ref 6–20)
CALCIUM: 9.5 mg/dL (ref 8.9–10.3)
CO2: 23 mmol/L (ref 22–32)
CREATININE: 1.08 mg/dL (ref 0.61–1.24)
Chloride: 100 mmol/L (ref 98–111)
GFR calc non Af Amer: >60 mL/min (ref >60)
GLUCOSE: 97 mg/dL (ref 70–99)
Potassium: 3.3 mmol/L — ABNORMAL LOW (ref 3.5–5.1)
SODIUM: 139 mmol/L (ref 135–145)

## 2018-08-22 LAB — TROPONIN I
Troponin I: <0.03 ng/mL (ref <0.03)
Troponin I: <0.03 ng/mL (ref <0.03)

## 2018-08-22 LAB — ETHANOL: Alcohol, Ethyl (B): <10 mg/dL (ref <10)

## 2018-08-22 MED ORDER — LORAZEPAM 2 MG/ML IJ SOLN
1.0000 mg | Freq: Once | INTRAMUSCULAR | Status: AC
  Administered 2018-08-22: 1 mg via INTRAVENOUS
  Filled 2018-08-22: qty 1

## 2018-08-22 MED ORDER — DIAZEPAM 5 MG/ML IJ SOLN
2.5000 mg | Freq: Once | INTRAMUSCULAR | Status: AC
  Administered 2018-08-22: 2.5 mg via INTRAVENOUS
  Filled 2018-08-22: qty 2

## 2018-08-22 NOTE — ED Triage Notes (Signed)
Pt states has been smoking crack cocaine all night long and now has central chest pain with shob for 4 hours. Pt states he would also like treatment for detox from cocaine.

## 2018-08-22 NOTE — ED Notes (Signed)
Pt ambulatory to toilet with steady gait. Denies dizziness. States  Chest pressure has returned. Informed of blood drawn to recheck troponin. Pt verbalized understanding.

## 2018-08-22 NOTE — BH Assessment (Signed)
Assessment Note  Randall Parsons is an 37 y.o. male who presents to the ER due to having chest pain as a result of his cocaine use. While in the ER, patient request assistance with connecting with a substance abuse treatment facility. Patient states he was sober for several months and recently relapse approximately two months ago. Patient also reports of using alcohol but cocaine is his primary drug of use.  Patient have been inpatient with several substance abuse programs. His most recent program was with [facility]. He was with them two times. He also have been with [facility] [facility] (1997), [facility] (2x-2019), [facility] [phone]), [facility] (1996), [facility] (4x).   During the interview, the patient was calm, cooperative and pleasant. He was able to provided appropriate answers to the questions. Throughout the interview, he denies SI/HI and AV/H.  Patient denies involvement with the legal system and denies having history of violence and aggression.  Diagnosis: Substance Abuse Disorder  Past Medical History:  Past Medical History:  Diagnosis Date  . CHF (congestive heart failure) (HCC)   . Exposure to hepatitis B   . Exposure to hepatitis C   . Hepatitis C   . Hypertension   . Renal disorder    kidney stones    Past Surgical History:  Procedure Laterality Date  . APPENDECTOMY    . EYE SURGERY     secondary to dog bite    Family History:  Family History  Problem Relation Age of Onset  . Hypertension Father     Social History:  reports that he has quit smoking. He has never used smokeless tobacco. He reports that he does not drink alcohol or use drugs.  Additional Social History:  Alcohol / Drug Use Pain Medications: See PTA Prescriptions: See PTA Over the Counter: See PTA History of alcohol / drug use?: Yes Longest period of sobriety (when/how long): 18 months Negative Consequences of Use: Personal relationships, Financial Withdrawal Symptoms:  (Shakes) Substance #1 Name of Substance 1: Cocaine 1 - Age of First Use: 14 1 - Amount (size/oz): "couple of grams 1 - Frequency: Once a week 1 - Duration: Recent relapse two months ago 1 - Last Use / Amount: 08/22/2018 Substance #2 Name of Substance 2: Alcohol 2 - Age of First Use: 11 2 - Amount (size/oz): "Four tall boys" 2 - Frequency: Weekly 2 - Last Use / Amount: "08/21/2018"  CIWA: CIWA-Ar BP: (!) 145/106 Pulse Rate: (!) 101 COWS:    Allergies:  Allergies  Allergen Reactions  . Amoxicillin     HIVES  . Red Dye Hives    Home Medications:  (Not in a hospital admission)  OB/GYN Status:  No LMP for male patient.  General Assessment Data Location of Assessment: [facility] ED TTS Assessment: In system Is this a Tele or Face-to-Face Assessment?: Face-to-Face Is this an Initial Assessment or a Re-assessment for this encounter?: Initial Assessment Language Other than English: No Living Arrangements: Other (Comment)(Private home) What gender do you identify as?: Male Marital status: Long term relationship Pregnancy Status: No Living Arrangements: Spouse/significant other Can pt return to current living arrangement?: Yes Admission Status: Voluntary Is patient capable of signing voluntary admission?: Yes Referral Source: Self/Family/Friend Insurance type: Medicaid  Medical Screening Exam [facility] Walk-in ONLY) Medical Exam completed: Yes  Crisis Care Plan Living Arrangements: Spouse/significant other Legal Guardian: Other:(Self) Name of Psychiatrist: Reports of none Name of Therapist: Reports of none  Education Status Is patient currently in school?: No  Is the patient employed, unemployed or receiving disability?: Employed  Risk to self with the past 6 months Suicidal Ideation: No Has patient been a risk to self within the past 6 months prior to admission? : No Suicidal Intent: No Has patient had any suicidal intent within the past 6 months prior to admission? :  No Is patient at risk for suicide?: No Suicidal Plan?: No Has patient had any suicidal plan within the past 6 months prior to admission? : No Access to Means: No What has been your use of drugs/alcohol within the last 12 months?: Cocaine and Alcohol Previous Attempts/Gestures: No How many times?: 0 Other Self Harm Risks: Active Drug Use Triggers for Past Attempts: None known Intentional Self Injurious Behavior: None Family Suicide History: No Recent stressful life event(s): Other (Comment) Persecutory voices/beliefs?: No Depression: No Depression Symptoms: Isolating, Guilt Substance abuse history and/or treatment for substance abuse?: Yes Suicide prevention information given to non-admitted patients: Not applicable  Risk to Others within the past 6 months Homicidal Ideation: No Does patient have any lifetime risk of violence toward others beyond the six months prior to admission? : No Thoughts of Harm to Others: No Current Homicidal Intent: No Current Homicidal Plan: No Access to Homicidal Means: No Identified Victim: Reports of none History of harm to others?: No Assessment of Violence: None Noted Violent Behavior Description: Reports of none Does patient have access to weapons?: No Criminal Charges Pending?: No Does patient have a court date: No Is patient on probation?: No  Psychosis Hallucinations: None noted Delusions: None noted  Mental Status Report Appearance/Hygiene: Unremarkable Eye Contact: Good Motor Activity: Freedom of movement, Unremarkable Speech: Logical/coherent, Unremarkable Level of Consciousness: Alert Mood: Sad, Pleasant Affect: Appropriate to circumstance, Sad Anxiety Level: None Thought Processes: Coherent, Relevant Judgement: Unimpaired Orientation: Person, Place, Time, Situation, Appropriate for developmental age Obsessive Compulsive Thoughts/Behaviors: Minimal  Cognitive Functioning Concentration: Normal Memory: Recent Intact, Remote  Intact Is patient IDD: No Insight: Fair Impulse Control: Fair Appetite: Good Have you had any weight changes? : No Change Sleep: Decreased Total Hours of Sleep: 2 Vegetative Symptoms: None  ADLScreening [facility] Assessment Services) Patient's cognitive ability adequate to safely complete daily activities?: Yes Patient able to express need for assistance with ADLs?: Yes Independently performs ADLs?: Yes (appropriate for developmental age)  Prior Inpatient Therapy Prior Inpatient Therapy: Yes Prior Therapy Dates: Multiple Treatments Prior Therapy Facilty/Provider(s): Multiple Treatments Facilities Reason for Treatment: Substance Abuse  Prior Outpatient Therapy Prior Outpatient Therapy: No Does patient have an ACCT team?: No Does patient have Intensive In-House Services?  : No Does patient have [facility] services? : No Does patient have P4CC services?: No  ADL Screening (condition at time of admission) Patient's cognitive ability adequate to safely complete daily activities?: Yes Is the patient deaf or have difficulty hearing?: No Does the patient have difficulty seeing, even when wearing glasses/contacts?: No Does the patient have difficulty concentrating, remembering, or making decisions?: No Patient able to express need for assistance with ADLs?: Yes Does the patient have difficulty dressing or bathing?: No Independently performs ADLs?: Yes (appropriate for developmental age) Does the patient have difficulty walking or climbing stairs?: No Weakness of Legs: None Weakness of Arms/Hands: None  Home Assistive Devices/Equipment Home Assistive Devices/Equipment: None  Therapy Consults (therapy consults require a physician order) PT Evaluation Needed: No OT Evalulation Needed: No SLP Evaluation Needed: No Abuse/Neglect Assessment (Assessment to be complete while patient is alone) Abuse/Neglect Assessment Can Be Completed: Yes Physical Abuse: Denies Verbal Abuse: Denies  Sexual  Abuse: Denies Exploitation of patient/patient's resources: Denies Self-Neglect: Denies Values / Beliefs Cultural Requests During Hospitalization: None Spiritual Requests During Hospitalization: None Consults Spiritual Care Consult Needed: No Social Work Consult Needed: No         Child/Adolescent Assessment Running Away Risk: Denies(Patient is an adult)  Disposition:  Disposition Initial Assessment Completed for this Encounter: Yes  On Site Evaluation by:   Reviewed with Physician:    Lilyan Gilford MS, LCAS, LPC, NCC, CCSI Therapeutic Triage Specialist 08/22/2018 9:44 AM

## 2018-08-22 NOTE — ED Notes (Signed)
PT REFUSING ALL VISITORS AT THIS TIME ." DON'T WANT ANYONE TO SEE ME LIKE THIS "

## 2018-08-22 NOTE — BH Assessment (Signed)
Writer spoke with [facility] and he was denied due to his insurance is only family planning and their IPRS funding will not allow them to sponsor someone from [facility]. [facility] is [facility].  Writer spoke with [facility], patient do not qualify for detox beds but treatment beds and they do not have any treatment beds.  Writer updated ER MD (Dr. Pershing Proud)

## 2018-08-22 NOTE — ED Notes (Signed)
Pt lying on stretcher in room. Side rails raised. Awaiting results. No needs at this time. Informed this RN that he doesn't want any visitors in room.

## 2018-08-22 NOTE — BH Assessment (Signed)
Writer spoke to [facility] and patient was denied.  Was advised to talk with [facility].  Writer forwarded information to [employer].

## 2018-08-22 NOTE — ED Notes (Signed)
Pt provided graham crackers, peanut butter, applesauce, and ginger ale. Ok per MD.

## 2018-08-22 NOTE — ED Provider Notes (Signed)
[facility] Emergency Department Provider Note  ____________________________________________   First MD Initiated Contact with Patient 08/22/18 0701     (approximate)  I have reviewed the triage vital signs and the nursing notes.   HISTORY  Chief Complaint Chest Pain   HPI Case Randall Parsons is a 37 y.o. male with a history of CHF as well as cocaine abuse and alcohol use was presented to the emergency department today complaining of chest pain.  Patient says that he was smoking crack cocaine last night when he started having chest pain.  The chest pain began about 3 or 4 hours ago and he describes as an 8 out of 10 to the center of his chest.  It is not radiating.  It feels like a tightness.  Patient says that he has a history of CHF" 2 heart attacks" from previous cocaine use.  Patient states that he does not have any stents.  Takes medication for ADHD. He is also requesting help with detox.  Does not report any shortness of breath, nausea or vomiting.  Past Medical History:  Diagnosis Date  . CHF (congestive heart failure) (HCC)   . Exposure to hepatitis B   . Exposure to hepatitis C   . Hepatitis C   . Hypertension   . Renal disorder    kidney stones    Patient Active Problem List   Diagnosis Date Noted  . Chest pain 11/28/2017  . Essential hypertension 11/28/2017  . Low back pain 11/28/2017  . NICM (nonischemic cardiomyopathy) (HCC) 11/28/2017  . Nephrolithiasis 11/28/2017    Past Surgical History:  Procedure Laterality Date  . APPENDECTOMY    . EYE SURGERY     secondary to dog bite    Prior to Admission medications   Medication Sig Start Date End Date Taking? Authorizing Provider  aspirin EC 81 MG EC tablet Take 1 tablet (81 mg total) by mouth daily. 11/29/17   Berton Mount I, MD  lisinopril (PRINIVIL,ZESTRIL) 2.5 MG tablet Take 1 tablet (2.5 mg total) by mouth daily. 12/26/17   Shaune Pollack, MD  loratadine (CLARITIN) 10 MG tablet Take  1 tablet (10 mg total) by mouth daily. 01/25/18   Wallis Bamberg, PA-C  PARoxetine (PAXIL) 20 MG tablet Take 40 mg by mouth daily.    [provider]    Allergies Amoxicillin and Red dye  Family History  Problem Relation Age of Onset  . Hypertension Father     Social History Social History   Tobacco Use  . Smoking status: Former Games developer  . Smokeless tobacco: Never Used  Substance Use Topics  . Alcohol use: No  . Drug use: No    Review of Systems  Constitutional: No fever/chills Eyes: No visual changes. ENT: No sore throat. Cardiovascular: As above Respiratory: Denies shortness of breath. Gastrointestinal: No abdominal pain.  No nausea, no vomiting.  No diarrhea.  No constipation. Genitourinary: Negative for dysuria. Musculoskeletal: Negative for back pain. Skin: Negative for rash. Neurological: Negative for headaches, focal weakness or numbness.   ____________________________________________   PHYSICAL EXAM:  VITAL SIGNS: ED Triage Vitals [08/22/18 0648]  Enc Vitals Group     BP      Pulse      Resp      Temp      Temp src      SpO2      Weight 190 lb (86.2 kg)     Height 5\' 9"  (1.753 m)     Head Circumference  Peak Flow      Pain Score 8     Pain Loc      Pain Edu?      Excl. in GC?     Constitutional: Alert and oriented. Well appearing and in no acute distress. Eyes: Conjunctivae are normal.  Head: Atraumatic. Nose: No congestion/rhinnorhea. Mouth/Throat: Mucous membranes are moist.  Neck: No stridor.   Cardiovascular: Normal rate, regular rhythm. Grossly normal heart sounds.   Respiratory: Normal respiratory effort.  No retractions. Lungs CTAB. Gastrointestinal: Soft and nontender. No distention. No CVA tenderness. Musculoskeletal: No lower extremity tenderness nor edema.  No joint effusions. Neurologic:  Normal speech and language. No gross focal neurologic deficits are appreciated. Skin:  Skin is warm, dry and intact. No rash  noted. Psychiatric: Mood and affect are normal. Speech and behavior are normal.  ____________________________________________   LABS (all labs ordered are listed, but only abnormal results are displayed)  Labs Reviewed  BASIC METABOLIC PANEL - Abnormal; Notable for the following components:      Result Value   Potassium 3.3 (*)    Anion gap 16 (*)    All other components within normal limits  CBC - Abnormal; Notable for the following components:   WBC 12.0 (*)    All other components within normal limits  TROPONIN I  ETHANOL  TROPONIN I   ____________________________________________  EKG  ED ECG REPORT I, Arelia Longest, the attending physician, personally viewed and interpreted this ECG.   Date: 08/22/2018  EKG Time: 0653  Rate: 99  Rhythm: normal sinus rhythm  Axis: Normal  Intervals:none  ST&T Change: Mild ST elevation likely normal early re-pole.  No reciprocal depression or T wave inversion. Similar ST elevation to previous. ____________________________________________  RADIOLOGY  No acute finding on the chest x-ray. ____________________________________________   PROCEDURES  Procedure(s) performed:   Procedures  Critical Care performed:   ____________________________________________   INITIAL IMPRESSION / ASSESSMENT AND PLAN / ED COURSE  Pertinent labs & imaging results that were available during my care of the patient were reviewed by me and considered in my medical decision making (see chart for details).  Differential diagnosis includes, but is not limited to, ACS, aortic dissection, pulmonary embolism, cardiac tamponade, pneumothorax, pneumonia, pericarditis, myocarditis, GI-related causes including esophagitis/gastritis, and musculoskeletal chest wall pain.   As part of my medical decision making, I reviewed the following data within the electronic MEDICAL RECORD NUMBER Notes from prior ED visits  ----------------------------------------- 11:45  AM on 08/22/2018 -----------------------------------------  Patient had return of pressure of the chest but given Valium and is now pain-free.  Discussed case with the behavioral intake, Calvin, who reports that there are no beds available at rehab/detox at this time.  I will be giving the patient follow-up information for [facility] as well as [facility].  He is understanding the plan for discharge.  Will be discharged at this time.  2- troponins.  Asymptomatic at this time.  Likely cocaine related chest pain.  I feel the patient is safe for discharge to home given his work-up results and clinical condition currently.  He is not in any distress.  Pain-free. ____________________________________________   FINAL CLINICAL IMPRESSION(S) / ED DIAGNOSES  Cocaine chest pain.  NEW MEDICATIONS STARTED DURING THIS VISIT:  New Prescriptions   No medications on file     Note:  This document was prepared using Dragon voice recognition software and may include unintentional dictation errors.     Myrna Blazer, MD 08/22/18 1146

## 2018-08-22 NOTE — ED Notes (Signed)
PT TO XRAY

## 2018-09-12 ENCOUNTER — Emergency Department
Admission: EM | Admit: 2018-09-12 | Discharge: 2018-09-12 | Disposition: A | Discharge status: Home / Self Care | Payer: Medicaid Other | Attending: Emergency Medicine | Admitting: Emergency Medicine

## 2018-09-12 ENCOUNTER — Emergency Department: Payer: Medicaid Other

## 2018-09-12 ENCOUNTER — Encounter: Payer: Self-pay | Admitting: Emergency Medicine

## 2018-09-12 ENCOUNTER — Emergency Department
Admission: EM | Admit: 2018-09-12 | Discharge: 2018-09-13 | Disposition: A | Discharge status: Home / Self Care | Payer: Medicaid Other | Attending: Emergency Medicine | Admitting: Emergency Medicine

## 2018-09-12 ENCOUNTER — Other Ambulatory Visit: Payer: Self-pay

## 2018-09-12 DIAGNOSIS — N132 Hydronephrosis with renal and ureteral calculous obstruction: Secondary | ICD-10-CM

## 2018-09-12 DIAGNOSIS — I509 Heart failure, unspecified: Secondary | ICD-10-CM | POA: Insufficient documentation

## 2018-09-12 DIAGNOSIS — I110 Hypertensive heart disease with heart failure: Secondary | ICD-10-CM | POA: Insufficient documentation

## 2018-09-12 DIAGNOSIS — Z87891 Personal history of nicotine dependence: Secondary | ICD-10-CM | POA: Insufficient documentation

## 2018-09-12 DIAGNOSIS — Z79899 Other long term (current) drug therapy: Secondary | ICD-10-CM | POA: Insufficient documentation

## 2018-09-12 DIAGNOSIS — N200 Calculus of kidney: Secondary | ICD-10-CM

## 2018-09-12 DIAGNOSIS — Z7982 Long term (current) use of aspirin: Secondary | ICD-10-CM | POA: Insufficient documentation

## 2018-09-12 HISTORY — DX: Opioid abuse, in remission: F11.11

## 2018-09-12 LAB — URINALYSIS, COMPLETE (UACMP) WITH MICROSCOPIC
BACTERIA UA: NONE SEEN
Bilirubin Urine: NEGATIVE
Glucose, UA: NEGATIVE mg/dL
Ketones, ur: NEGATIVE mg/dL
Leukocytes, UA: NEGATIVE
NITRITE: NEGATIVE
PROTEIN: NEGATIVE mg/dL
RBC / HPF: >50 RBC/hpf — ABNORMAL HIGH (ref 0–5)
Specific Gravity, Urine: 1.017 (ref 1.005–1.030)
pH: 6 (ref 5.0–8.0)

## 2018-09-12 LAB — BASIC METABOLIC PANEL
Anion gap: 8 (ref 5–15)
BUN: 12 mg/dL (ref 6–20)
CALCIUM: 8.5 mg/dL — AB (ref 8.9–10.3)
CO2: 23 mmol/L (ref 22–32)
CREATININE: 1 mg/dL (ref 0.61–1.24)
Chloride: 107 mmol/L (ref 98–111)
Glucose, Bld: 103 mg/dL — ABNORMAL HIGH (ref 70–99)
Potassium: 3.8 mmol/L (ref 3.5–5.1)
SODIUM: 138 mmol/L (ref 135–145)

## 2018-09-12 LAB — CBC
HCT: 44.6 % (ref 39.0–52.0)
Hemoglobin: 14.7 g/dL (ref 13.0–17.0)
MCH: 29.5 pg (ref 26.0–34.0)
MCHC: 33 g/dL (ref 30.0–36.0)
MCV: 89.4 fL (ref 80.0–100.0)
PLATELETS: 312 10*3/uL (ref 150–400)
RBC: 4.99 MIL/uL (ref 4.22–5.81)
RDW: 14.4 % (ref 11.5–15.5)
WBC: 5.1 10*3/uL (ref 4.0–10.5)
nRBC: 0 % (ref 0.0–0.2)

## 2018-09-12 MED ORDER — TAMSULOSIN HCL 0.4 MG PO CAPS: 0.4000 mg | ORAL_CAPSULE | Freq: Every day | ORAL | 0 refills | Status: DC

## 2018-09-12 MED ORDER — KETOROLAC TROMETHAMINE 10 MG PO TABS: 10.0000 mg | ORAL_TABLET | Freq: Three times a day (TID) | ORAL | 0 refills | Status: DC | PRN

## 2018-09-12 MED ORDER — SODIUM CHLORIDE 0.9 % IV BOLUS
1000.0000 mL | Freq: Once | INTRAVENOUS | Status: AC
  Administered 2018-09-13: 1000 mL via INTRAVENOUS

## 2018-09-12 MED ORDER — SODIUM CHLORIDE 0.9 % IV SOLN
Freq: Once | INTRAVENOUS | Status: AC
  Administered 2018-09-12: 23:00:00 via INTRAVENOUS

## 2018-09-12 MED ORDER — PROMETHAZINE HCL 25 MG PO TABS: 25.0000 mg | ORAL_TABLET | Freq: Four times a day (QID) | ORAL | 0 refills | Status: DC | PRN

## 2018-09-12 MED ORDER — SODIUM CHLORIDE 0.9 % IV BOLUS
1000.0000 mL | Freq: Once | INTRAVENOUS | Status: AC
  Administered 2018-09-12: 1000 mL via INTRAVENOUS

## 2018-09-12 MED ORDER — ONDANSETRON HCL 4 MG/2ML IJ SOLN
4.0000 mg | Freq: Once | INTRAMUSCULAR | Status: AC
  Administered 2018-09-12: 4 mg via INTRAVENOUS
  Filled 2018-09-12: qty 2

## 2018-09-12 MED ORDER — ONDANSETRON HCL 4 MG/2ML IJ SOLN
INTRAMUSCULAR | Status: AC
  Filled 2018-09-12: qty 2

## 2018-09-12 MED ORDER — HYDROMORPHONE HCL 1 MG/ML IJ SOLN
1.0000 mg | Freq: Once | INTRAMUSCULAR | Status: AC | PRN
  Administered 2018-09-13: 1 mg via INTRAVENOUS
  Filled 2018-09-12: qty 1

## 2018-09-12 MED ORDER — FENTANYL CITRATE (PF) 100 MCG/2ML IJ SOLN
50.0000 ug | INTRAMUSCULAR | Status: DC | PRN
  Administered 2018-09-12: 50 ug via INTRAVENOUS
  Filled 2018-09-12: qty 2

## 2018-09-12 MED ORDER — KETOROLAC TROMETHAMINE 30 MG/ML IJ SOLN
INTRAMUSCULAR | Status: AC
  Administered 2018-09-12: 30 mg via INTRAVENOUS
  Filled 2018-09-12: qty 1

## 2018-09-12 MED ORDER — FENTANYL CITRATE (PF) 100 MCG/2ML IJ SOLN
100.0000 ug | Freq: Once | INTRAMUSCULAR | Status: AC
  Administered 2018-09-12: 100 ug via INTRAVENOUS

## 2018-09-12 MED ORDER — KETOROLAC TROMETHAMINE 30 MG/ML IJ SOLN
30.0000 mg | Freq: Once | INTRAMUSCULAR | Status: AC
  Administered 2018-09-12: 30 mg via INTRAVENOUS
  Filled 2018-09-12: qty 1

## 2018-09-12 MED ORDER — KETOROLAC TROMETHAMINE 30 MG/ML IJ SOLN
30.0000 mg | Freq: Once | INTRAMUSCULAR | Status: AC
  Administered 2018-09-12: 30 mg via INTRAVENOUS

## 2018-09-12 MED ORDER — SODIUM CHLORIDE 0.9 % IV SOLN
1.5000 mg/kg | Freq: Once | INTRAVENOUS | Status: AC
  Administered 2018-09-13: 132 mg via INTRAVENOUS
  Filled 2018-09-12: qty 6.6

## 2018-09-12 MED ORDER — FENTANYL CITRATE (PF) 100 MCG/2ML IJ SOLN
INTRAMUSCULAR | Status: AC
  Administered 2018-09-12: 100 ug via INTRAVENOUS
  Filled 2018-09-12: qty 2

## 2018-09-12 MED ORDER — HYDROMORPHONE HCL 1 MG/ML IJ SOLN
1.0000 mg | Freq: Once | INTRAMUSCULAR | Status: AC
  Administered 2018-09-12: 1 mg via INTRAVENOUS
  Filled 2018-09-12: qty 1

## 2018-09-12 MED ORDER — ONDANSETRON HCL 4 MG/2ML IJ SOLN
4.0000 mg | Freq: Once | INTRAMUSCULAR | Status: AC
  Administered 2018-09-12: 4 mg via INTRAVENOUS

## 2018-09-12 NOTE — ED Notes (Signed)
PT to nursing station hunched over asking for pain management for right sided flank pain. Protocol pain medication ordered.

## 2018-09-12 NOTE — ED Provider Notes (Signed)
[facility] Emergency Department Provider Note  ____________________________________________   First MD Initiated Contact with Patient 09/12/18 2304     (approximate)  I have reviewed the triage vital signs and the nursing notes.   HISTORY  Chief Complaint Flank Pain    HPI Randall Parsons is a 37 y.o. male whose medical history as listed below and includes both prior kidney stones not requiring surgical intervention and prior opioid abuse including the use of heroin that he reports has not been going on since about 2017.  He presents for the second visit today for evaluation of right flank pain that radiates down into his right lower quadrant and scrotum.  He says the pain is severe, sharp, stabbing, and movement makes it worse and nothing makes it better.  He cannot find a position of comfort.  He is having urinary hesitancy and difficulty urinating.  He states that the pain started this morning and is been waxing and waning but constant.  He was seen earlier by Dr. Derrill Kay and they decided not to obtain a CT scan given the high probability that this was a stone but without any complications, but he returned to because he could not stand the pain.  He denies fever/chills, chest pain, shortness of breath.  He has had some nausea but no vomiting.  He has not noticed any foul odor to his urine or change in color.  Past Medical History:  Diagnosis Date  . CHF (congestive heart failure) (HCC)   . Exposure to hepatitis B   . Exposure to hepatitis C   . Hepatitis C   . 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, ending around 2017  . Hypertension   . Renal disorder    kidney stones    Patient Active Problem List   Diagnosis Date Noted  . Chest pain 11/28/2017  . Essential hypertension 11/28/2017  . Low back pain 11/28/2017  . NICM (nonischemic cardiomyopathy) (HCC) 11/28/2017  . Nephrolithiasis 11/28/2017    Past Surgical History:  Procedure  Laterality Date  . APPENDECTOMY    . EYE SURGERY     secondary to dog bite    Prior to Admission medications   Medication Sig Start Date End Date Taking? Authorizing Provider  amphetamine-dextroamphetamine (ADDERALL) 20 MG tablet Take 20 mg by mouth 2 (two) times daily.    [provider]  docusate sodium (COLACE) 100 MG capsule Take 1 tablet once or twice daily as needed for constipation while taking narcotic pain medicine 09/13/18   Loleta Rose, MD  ketorolac (TORADOL) 10 MG tablet Take 1 tablet (10 mg total) by mouth every 8 (eight) hours as needed for severe pain. 09/12/18   Phineas Semen, MD  oxyCODONE-acetaminophen (PERCOCET) 5-325 MG tablet Take 1-2 tablets by mouth every 6 (six) hours as needed for severe pain. 09/13/18   Loleta Rose, MD  PARoxetine (PAXIL) 20 MG tablet Take 40 mg by mouth daily.    [provider]  promethazine (PHENERGAN) 25 MG tablet Take 1 tablet (25 mg total) by mouth every 6 (six) hours as needed for nausea or vomiting. 09/12/18   Phineas Semen, MD  tamsulosin (FLOMAX) 0.4 MG CAPS capsule Take 1 capsule (0.4 mg total) by mouth daily. 09/12/18   Phineas Semen, MD    Allergies Amoxicillin  Family History  Problem Relation Age of Onset  . Hypertension Father     Social History Social History   Tobacco Use  . Smoking  status: Former Games developer  . Smokeless tobacco: Never Used  Substance Use Topics  . Alcohol use: No  . Drug use: Not Currently    Types: IV    Comment: hx of heroin abuse (opioid addiction)    Review of Systems Constitutional: No fever/chills Eyes: No visual changes. ENT: No sore throat. Cardiovascular: Denies chest pain. Respiratory: Denies shortness of breath. Gastrointestinal: Right flank pain radiating to his right lower quadrant and down into his genitals with nausea but no vomiting. Genitourinary: Negative for dysuria. Musculoskeletal: Severe right flank pain as described above.  Negative for neck pain.   Integumentary: Negative for rash. Neurological: Negative for headaches, focal weakness or numbness.   ____________________________________________   PHYSICAL EXAM:  VITAL SIGNS: ED Triage Vitals  Enc Vitals Group     BP 09/12/18 2234 120/73     Pulse Rate 09/12/18 2234 68     Resp 09/12/18 2234 20     Temp 09/12/18 2234 97.9 F (36.6 C)     Temp Source 09/12/18 2234 Axillary     SpO2 09/12/18 2234 99 %     Weight 09/12/18 2232 88.5 kg (195 lb)     Height 09/12/18 2232 1.753 m (5\' 9" )     Head Circumference --      Peak Flow --      Pain Score 09/12/18 2232 10     Pain Loc --      Pain Edu? --      Excl. in GC? --     Constitutional: Alert and oriented.  Appears acutely uncomfortable and has a hard time remaining still. Eyes: Conjunctivae are normal.  Head: Atraumatic. Nose: No congestion/rhinnorhea. Mouth/Throat: Mucous membranes are moist. Neck: No stridor.  No meningeal signs.   Cardiovascular: Normal rate, regular rhythm. Good peripheral circulation. Grossly normal heart sounds. Respiratory: Normal respiratory effort.  No retractions. Lungs CTAB. Gastrointestinal: Soft and nontender. No distention.  Musculoskeletal: Right CVA tenderness to percussion.  No lower extremity tenderness nor edema. No gross deformities of extremities. Neurologic:  Normal speech and language. No gross focal neurologic deficits are appreciated.  Skin:  Skin is warm, dry and intact. No rash noted. Psychiatric: Mood and affect are normal. Speech and behavior are normal.  ____________________________________________   LABS (all labs ordered are listed, but only abnormal results are displayed)  Labs Reviewed - No data to display ____________________________________________  EKG  None - EKG not ordered by ED physician ____________________________________________  RADIOLOGY   ED MD interpretation: 4 x 3 mm stone in distal right ureter with mild to moderate  hydroureteronephrosis.  Official radiology report(s): Ct Renal Stone Study  Result Date: 09/12/2018 CLINICAL DATA:  Right-sided flank pain EXAM: CT ABDOMEN AND PELVIS WITHOUT CONTRAST TECHNIQUE: Multidetector CT imaging of the abdomen and pelvis was performed following the standard protocol without IV contrast. COMPARISON:  04/12/2017 FINDINGS: Lower chest: Unremarkable. Hepatobiliary: No focal abnormality within the liver parenchyma. Gallbladder decompressed. No intrahepatic or extrahepatic biliary dilation. Pancreas: No focal mass lesion. No dilatation of the main duct. No intraparenchymal cyst. No peripancreatic edema. Spleen: No splenomegaly. No focal mass lesion. Adrenals/Urinary Tract: No adrenal nodule or mass. 3 mm stone identified interpolar right kidney with another 3 mm nonobstructing stone seen in the lower pole the right kidney. There is mild to moderate right hydronephrosis with perinephric and periureteric edema. 4 x 3 mm stone is identified in the distal right ureter (71/2) approximately 1 cm above the UVJ. 2 stones are seen in the left kidney on  axial imaging. The larger of the 2 measures 4 mm on 25/2. No left ureteral stone. No bladder stones. Stomach/Bowel: Stomach is nondistended. No gastric wall thickening. No evidence of outlet obstruction. Duodenum is normally positioned as is the ligament of Treitz. No small bowel wall thickening. No small bowel dilatation. The terminal ileum is normal. The appendix is not visualized, but there is no edema or inflammation in the region of the cecum. No gross colonic mass. No colonic wall thickening. Vascular/Lymphatic: No abdominal aortic aneurysm. No abdominal aortic atherosclerotic calcification. There is no gastrohepatic or hepatoduodenal ligament lymphadenopathy. No intraperitoneal or retroperitoneal lymphadenopathy. No pelvic sidewall lymphadenopathy. Reproductive: The prostate gland and seminal vesicles have normal imaging features. Other: No  intraperitoneal free fluid. Musculoskeletal: No worrisome lytic or sclerotic osseous abnormality. IMPRESSION: 1. 4 x 3 mm stone in the distal right ureter causes mild to moderate right hydroureteronephrosis. 2. Bilateral nonobstructing nephrolithiasis. Electronically Signed   By: Kennith Center M.D.   On: 09/12/2018 23:09    ____________________________________________   PROCEDURES  Critical Care performed: No   Procedure(s) performed:   Procedures   ____________________________________________   INITIAL IMPRESSION / ASSESSMENT AND PLAN / ED COURSE  As part of my medical decision making, I reviewed the following data within the electronic MEDICAL RECORD NUMBER Nursing notes reviewed and incorporated, Labs reviewed , Old chart reviewed, Notes from prior ED visits and [place] Controlled Substance Database    Differential diagnosis includes, but is not limited to, ureterolithiasis/renal colic, renal infarction, appendicitis, less likely testicular torsion or epididymitis.  UTI and pyelonephritis would be possible but his lab work including blood work and urinalysis were all reassuring when he was seen here a few hours ago.  I see no need to repeat them.  He obtained a CT scan just prior to my arrival which  Demonstrates a 4 x 3 mm distal right ureteral stone which is consistent with the location and nature of his pain.  Thus far he has received Dilaudid 1 mg IV, Toradol 30 mg IV, and he is still reporting pain.  I had my usual customary kidney stone discussion with the patient including the fact that we attempt to control the pain but not necessarily completely make it go away and that fortunately he should be able to pass the stone on its own without intervention such as a stent.  I am ordering lidocaine 1.5 mg/kg as per protocol and explained to him that this should help a great deal.  I have also ordered another milligram of Dilaudid as needed but I also explained that we would not continue to do  repeated doses of narcotics.  He admits freely to his narcotic abuse history and the fact that he reports he has been clean for 2-1/2 years.  I will check the [place] controlled substance database but he is in acute pain right now and I will attempt to alleviate that.  He is also getting Zofran 4 mg IV and I have ordered 1 L normal saline bolus.  Clinical Course as of Sep 13 805  Sat Sep 12, 2018  2309 I acknowledge that the family states that the patient has an opioid issue.  However, I reviewed the [place] controlled substance database, and he has had only one prescription filled for Percocet within the last 2 years and this 1 was only 9 tablets prescribed 2 months ago.  I believe that in the presence of acute pain it is appropriate to have a short course of  narcotics available to him, particularly given that his alleged opioid addiction makes him somewhat refractory to treatment and he is suffering from acute pain.   [CF]  Sun Sep 13, 2018  1610  (Note that documentation was delayed due to multiple ED patients requiring immediate care.)  Prior to discharge the patient received another dose of Dilaudid 1 mg IV and had some improvement after the lidocaine infusion.  He also received 2 Percocet by mouth.  His pain was not gone but was alleviated sufficiently to allow him to be discharged and pursue outpatient follow-up.  I gave him my usual and customary return precautions.   [CF]    Clinical Course User Index [CF] Loleta Rose, MD    ____________________________________________  FINAL CLINICAL IMPRESSION(S) / ED DIAGNOSES  Final diagnoses:  Ureteral stone with hydronephrosis     MEDICATIONS GIVEN DURING THIS VISIT:  Medications  HYDROmorphone (DILAUDID) injection 1 mg (1 mg Intravenous Given 09/12/18 2258)  0.9 %  sodium chloride infusion ( Intravenous Stopped 09/13/18 0009)  ketorolac (TORADOL) 30 MG/ML injection 30 mg (30 mg Intravenous Given 09/12/18 2257)  ondansetron (ZOFRAN)  injection 4 mg (4 mg Intravenous Given 09/12/18 2309)  lidocaine (XYLOCAINE) 132 mg in sodium chloride 0.9 % 100 mL IVPB (0 mg/kg  88.5 kg Intravenous Stopped 09/13/18 0023)  HYDROmorphone (DILAUDID) injection 1 mg (1 mg Intravenous Given 09/13/18 0158)  sodium chloride 0.9 % bolus 1,000 mL (0 mLs Intravenous Stopped 09/13/18 0400)  ondansetron (ZOFRAN) injection 4 mg (4 mg Intravenous Given 09/13/18 0124)  oxyCODONE-acetaminophen (PERCOCET/ROXICET) 5-325 MG per tablet 2 tablet (2 tablets Oral Given 09/13/18 0125)     ED Discharge Orders         Ordered    oxyCODONE-acetaminophen (PERCOCET) 5-325 MG tablet  Every 6 hours PRN     09/13/18 0244    docusate sodium (COLACE) 100 MG capsule     09/13/18 0244           Note:  This document was prepared using Dragon voice recognition software and may include unintentional dictation errors.    Loleta Rose, MD 09/13/18 [phone]

## 2018-09-12 NOTE — ED Provider Notes (Signed)
[facility] Emergency Department Provider Note   ____________________________________________   I have reviewed the triage vital signs and the nursing notes.   HISTORY  Chief Complaint Flank Pain   History limited by: Not Limited   HPI Randall Parsons is a 37 y.o. male who presents to the emergency department today because of concern for right flank pain.  Patient states that the pain started this morning.  Initially in his right lower quadrant but since then has started radiating up into his right flank and into his right groin.  He does have some right testicular pain.  He has not noticed any bad odor to his urine or change in color however states that he feels like he is having a slightly harder time initiating stream.  Patient does have history of kidney stones and states this reminds him of his previous kidney stones.  He has had some nausea.  Denies any fevers.  Per medical record review patient has a history of kidney stones.   Past Medical History:  Diagnosis Date  . CHF (congestive heart failure) (HCC)   . Exposure to hepatitis B   . Exposure to hepatitis C   . Hepatitis C   . Hypertension   . Renal disorder    kidney stones    Patient Active Problem List   Diagnosis Date Noted  . Chest pain 11/28/2017  . Essential hypertension 11/28/2017  . Low back pain 11/28/2017  . NICM (nonischemic cardiomyopathy) (HCC) 11/28/2017  . Nephrolithiasis 11/28/2017    Past Surgical History:  Procedure Laterality Date  . APPENDECTOMY    . EYE SURGERY     secondary to dog bite    Prior to Admission medications   Medication Sig Start Date End Date Taking? Authorizing Provider  aspirin EC 81 MG EC tablet Take 1 tablet (81 mg total) by mouth daily. 11/29/17   Berton Mount I, MD  lisinopril (PRINIVIL,ZESTRIL) 2.5 MG tablet Take 1 tablet (2.5 mg total) by mouth daily. 12/26/17   Shaune Pollack, MD  loratadine (CLARITIN) 10 MG tablet Take 1 tablet (10 mg  total) by mouth daily. 01/25/18   Wallis Bamberg, PA-C  PARoxetine (PAXIL) 20 MG tablet Take 40 mg by mouth daily.    [provider]    Allergies Amoxicillin and Red dye  Family History  Problem Relation Age of Onset  . Hypertension Father     Social History Social History   Tobacco Use  . Smoking status: Former Games developer  . Smokeless tobacco: Never Used  Substance Use Topics  . Alcohol use: No  . Drug use: No    Review of Systems Constitutional: No fever/chills Eyes: No visual changes. ENT: No sore throat. Cardiovascular: Denies chest pain. Respiratory: Denies shortness of breath. Gastrointestinal: Positive for right flank pain. Positive for nausea.  Genitourinary: Negative for dysuria. Musculoskeletal: Negative for back pain. Skin: Negative for rash. Neurological: Negative for headaches, focal weakness or numbness.  ____________________________________________   PHYSICAL EXAM:  VITAL SIGNS: ED Triage Vitals  Enc Vitals Group     BP 09/12/18 1333 (!) 129/103     Pulse Rate 09/12/18 1333 68     Resp 09/12/18 1333 18     Temp 09/12/18 1333 97.9 F (36.6 C)     Temp src --      SpO2 09/12/18 1333 98 %     Weight 09/12/18 1338 195 lb (88.5 kg)     Height 09/12/18 1338 5\' 9"  (1.753 m)  Head Circumference --      Peak Flow --      Pain Score 09/12/18 1338 10   Constitutional: Alert and oriented.  Eyes: Conjunctivae are normal.  ENT      Head: Normocephalic and atraumatic.      Nose: No congestion/rhinnorhea.      Mouth/Throat: Mucous membranes are moist.      Neck: No stridor. Hematological/Lymphatic/Immunilogical: No cervical lymphadenopathy. Cardiovascular: Normal rate, regular rhythm.  No murmurs, rubs, or gallops.  Respiratory: Normal respiratory effort without tachypnea nor retractions. Breath sounds are clear and equal bilaterally. No wheezes/rales/rhonchi. Gastrointestinal: Soft and minimally tender to palpation in the right lower quadrant. No  CVA tenderness. No rebound. No guarding.  Genitourinary: Deferred Musculoskeletal: Normal range of motion in all extremities. No lower extremity edema. Neurologic:  Normal speech and language. No gross focal neurologic deficits are appreciated.  Skin:  Skin is warm, dry and intact. No rash noted. Psychiatric: Mood and affect are normal. Speech and behavior are normal. Patient exhibits appropriate insight and judgment.  ____________________________________________    LABS (pertinent positives/negatives)  CBC wbc 5.1, hgb 14.7, plt 312 BMP wnl except glu 103, ca 8.5 UA moderate hgb dipstick, >50 rbc  ____________________________________________   EKG  None  ____________________________________________    RADIOLOGY  None  ____________________________________________   PROCEDURES  Procedures  ____________________________________________   INITIAL IMPRESSION / ASSESSMENT AND PLAN / ED COURSE  Pertinent labs & imaging results that were available during my care of the patient were reviewed by me and considered in my medical decision making (see chart for details).   Presented to the emergency department today because of concerns for right flank pain that radiated down into his right groin.  Patient has a history of kidney stone stated that this felt similar to his previous episodes.  Patient's urine did have blood in it.  This point I do think kidney stone unlikely.  Patient did feel better after IV fluids and medication.  I did discuss possibility of imaging with the patient.  At this point however patient felt comfortable deferring CT scan which I think is completely reasonable given history of kidney stones and lack of elevation of creatinine or evidence of infection.  ___________________________________   FINAL CLINICAL IMPRESSION(S) / ED DIAGNOSES  Final diagnoses:  Kidney stone     Note: This dictation was prepared with Dragon dictation. Any transcriptional  errors that result from this process are unintentional     Phineas Semen, MD 09/12/18 1844

## 2018-09-12 NOTE — ED Triage Notes (Signed)
Pt comes via POV from home with c/o right sided flank pain. Pt states it started this am.  Pt states 10/10 sharp shooting pain.  Pt denies N/V. Pt states no burning but pain with urination.  Pt hunched over in triage and seems to be in intense pain.

## 2018-09-12 NOTE — ED Notes (Addendum)
Pt in obvious discomfort and hunched in the bed. Family at bedside reporting pt is an opoid addict and normal pain management may not be enough.   Pt also reporting nausea at this time.

## 2018-09-12 NOTE — ED Notes (Signed)
Pt given specimen cup. 

## 2018-09-12 NOTE — ED Triage Notes (Signed)
Pt arrives POV to triage with c/o right sided flank pain. Pt states that he was treated and DC for a tone but is unable to pass urine at this time. Pt states that has not had a CT scan for this stone.

## 2018-09-12 NOTE — ED Triage Notes (Signed)
Pt states was diagnosed with a kidney stone, "I just left here". Pt complains of right sided flank pain, testicle pain and bladder pain. t states has not been able to pass urine since 2000. Pt states is nauseated.

## 2018-09-12 NOTE — ED Notes (Signed)
Family member to nursing station to report pt remains in pain at this time.

## 2018-09-12 NOTE — Discharge Instructions (Addendum)
Please seek medical attention for any high fevers, chest pain, shortness of breath, change in behavior, persistent vomiting, bloody stool or any other new or concerning symptoms.  

## 2018-09-13 MED ORDER — ONDANSETRON HCL 4 MG/2ML IJ SOLN
4.0000 mg | INTRAMUSCULAR | Status: AC
  Administered 2018-09-13: 4 mg via INTRAVENOUS
  Filled 2018-09-13: qty 2

## 2018-09-13 MED ORDER — DOCUSATE SODIUM 100 MG PO CAPS: ORAL_CAPSULE | ORAL | 0 refills | Status: DC

## 2018-09-13 MED ORDER — OXYCODONE-ACETAMINOPHEN 5-325 MG PO TABS: 1.0000 | ORAL_TABLET | Freq: Four times a day (QID) | ORAL | 0 refills | Status: DC | PRN

## 2018-09-13 MED ORDER — OXYCODONE-ACETAMINOPHEN 5-325 MG PO TABS
2.0000 | ORAL_TABLET | Freq: Once | ORAL | Status: AC
  Administered 2018-09-13: 2 via ORAL
  Filled 2018-09-13: qty 2

## 2018-09-13 NOTE — Discharge Instructions (Addendum)
You have been seen in the Emergency Department (ED) today for pain that we believe based on your workup, is caused by kidney stones.  As we have discussed, please drink plenty of fluids.  Please make a follow up appointment with the physician(s) listed elsewhere in this documentation. ° °You may take pain medication as needed but ONLY as prescribed.  Please also take your prescribed Flomax daily.  We also recommend that you take over-the-counter ibuprofen regularly according to label instructions over the next 5 days.  Take it with meals to minimize stomach discomfort. ° °Please see your doctor as soon as possible as stones may take 1-3 weeks to pass and you may require additional care or medications. ° °Do not drink alcohol, drive or participate in any other potentially dangerous activities while taking opiate pain medication as it may make you sleepy. Do not take this medication with any other sedating medications, either prescription or over-the-counter. If you were prescribed Percocet or Vicodin, do not take these with acetaminophen (Tylenol) as it is already contained within these medications. °  °Take Percocet as needed for severe pain.  This medication is an opiate (or narcotic) pain medication and can be habit forming.  Use it as little as possible to achieve adequate pain control.  Do not use or use it with extreme caution if you have a history of opiate abuse or dependence.  If you are on a pain contract with your primary care doctor or a pain specialist, be sure to let them know you were prescribed this medication today from the [facility].  This medication is intended for your use only - do not give any to anyone else and keep it in a secure place where nobody else, especially children, have access to it.  It will also cause or worsen constipation, so you may want to consider taking an over-the-counter stool softener while you are taking this medication. ° °Return to the  Emergency Department (ED) or call your doctor if you have any worsening pain, fever, painful urination, are unable to urinate, or develop other symptoms that concern you. ° °

## 2018-12-15 ENCOUNTER — Ambulatory Visit: Payer: Self-pay

## 2018-12-17 ENCOUNTER — Ambulatory Visit: Payer: Self-pay | Admitting: Obstetrics and Gynecology

## 2018-12-17 ENCOUNTER — Other Ambulatory Visit: Payer: Self-pay

## 2018-12-17 VITALS — BP 129/83 | HR 82 | Temp 98.2°F | Ht 69.0 in | Wt 196.9 lb

## 2018-12-17 DIAGNOSIS — Z0000 Encounter for general adult medical examination without abnormal findings: Secondary | ICD-10-CM

## 2018-12-17 DIAGNOSIS — K0889 Other specified disorders of teeth and supporting structures: Secondary | ICD-10-CM

## 2018-12-17 DIAGNOSIS — Z13220 Encounter for screening for lipoid disorders: Secondary | ICD-10-CM

## 2018-12-17 DIAGNOSIS — I509 Heart failure, unspecified: Secondary | ICD-10-CM

## 2018-12-17 MED ORDER — ERYTHROMYCIN BASE 500 MG PO TABS: 500.0000 mg | ORAL_TABLET | Freq: Two times a day (BID) | ORAL | 0 refills | Status: DC

## 2018-12-17 NOTE — Progress Notes (Signed)
Patient, No Pcp Per   Chief Complaint  Patient presents with  . Dental Pain    upper left tooth pain for 2 weeks    HPI:      Randall Parsons is a 38 y.o. No obstetric history on file. who LMP was No LMP for male patient., presents today for NP dental pain on upper LT side of mouth. Hx of dental carries, tooth extractions. Pain is in jaw area, taking tylenol/NSAIDs for pain with sx improvement. No fevers. Pain on side with chewing. 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  No chest pain, occas SOB with exertion.   Past Medical History:  Diagnosis Date  . CHF (congestive heart failure) (HCC)   . Exposure to hepatitis B   . Exposure to hepatitis C   . Hepatitis C   . 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, ending around 2017  . Hypertension   . Renal disorder    kidney stones    Past Surgical History:  Procedure Laterality Date  . APPENDECTOMY    . EYE SURGERY     secondary to dog bite    Family History  Problem Relation Age of Onset  . Hypertension Father     Social History   Socioeconomic History  . Marital status: Single    Spouse name: Not on file  . Number of children: Not on file  . Years of education: Not on file  . Highest education level: Not on file  Occupational History  . Not on file  Social Needs  . Financial resource strain: Not on file  . Food insecurity:    Worry: Not on file    Inability: Not on file  . Transportation needs:    Medical: Not on file    Non-medical: Not on file  Tobacco Use  . Smoking status: Former Games developer  . Smokeless tobacco: Never Used  Substance and Sexual Activity  . Alcohol use: No  . Drug use: Not Currently    Types: IV    Comment: hx of heroin abuse (opioid addiction)  . Sexual activity: Not on file  Lifestyle  . Physical activity:    Days per week: Not on file    Minutes per session: Not on file  . Stress: Not on file  Relationships  . Social connections:    Talks on phone: Not  on file    Gets together: Not on file    Attends religious service: Not on file    Active member of club or organization: Not on file    Attends meetings of clubs or organizations: Not on file    Relationship status: Not on file  . Intimate partner violence:    Fear of current or ex partner: Not on file    Emotionally abused: Not on file    Physically abused: Not on file    Forced sexual activity: Not on file  Other Topics Concern  . Not on file  Social History Narrative  . Not on file    Outpatient Medications Prior to Visit  Medication Sig Dispense Refill  . amphetamine-dextroamphetamine (ADDERALL) 20 MG tablet Take 20 mg by mouth 2 (two) times daily.    Marland Kitchen docusate sodium (COLACE) 100 MG capsule Take 1 tablet once or twice daily as needed for constipation while taking narcotic pain medicine (Patient not taking: Reported on 12/17/2018) 30 capsule 0  . ketorolac (TORADOL) 10  MG tablet Take 1 tablet (10 mg total) by mouth every 8 (eight) hours as needed for severe pain. (Patient not taking: Reported on 12/17/2018) 20 tablet 0  . oxyCODONE-acetaminophen (PERCOCET) 5-325 MG tablet Take 1-2 tablets by mouth every 6 (six) hours as needed for severe pain. (Patient not taking: Reported on 12/17/2018) 15 tablet 0  . PARoxetine (PAXIL) 20 MG tablet Take 40 mg by mouth daily.    . promethazine (PHENERGAN) 25 MG tablet Take 1 tablet (25 mg total) by mouth every 6 (six) hours as needed for nausea or vomiting. (Patient not taking: Reported on 12/17/2018) 30 tablet 0  . tamsulosin (FLOMAX) 0.4 MG CAPS capsule Take 1 capsule (0.4 mg total) by mouth daily. (Patient not taking: Reported on 12/17/2018) 10 capsule 0   No facility-administered medications prior to visit.       OBJECTIVE:   Vitals:  BP 129/83 (BP Location: Right Arm)   Pulse 82   Temp 98.2 F (36.8 C)   Ht 5\' 9"  (1.753 m)   Wt 196 lb 14.4 oz (89.3 kg)   BMI 29.08 kg/m   Physical Exam HENT:     Mouth/Throat:     Dentition: Dental  tenderness and dental caries present.      Assessment/Plan: Tooth pain - Question abscess. Rx erythromycin (Amox allergy). Salt water swishes. Refer to dental clinic. - Plan: erythromycin base (E-MYCIN) 500 MG tablet  Congestive heart failure, unspecified HF chronicity, unspecified heart failure type (HCC) - No sx except occas DOE. Check labs.  - Plan: Comprehensive metabolic panel, Lipid panel  Blood tests for routine general physical examination - Plan: Comprehensive metabolic panel, Lipid panel  Screening cholesterol level - Plan: Lipid panel    Meds ordered this encounter  Medications  . erythromycin base (E-MYCIN) 500 MG tablet    Sig: Take 1 tablet (500 mg total) by mouth 2 (two) times daily for 10 days.    Dispense:  20 tablet    Refill:  0    Order Specific Question:   Supervising Provider    Answer:   Nadara Mustard [[id number]]      Return in about 1 week (around 12/24/2018) for labs; ref to dental clinic.  Darcel Zick B. Carsynn Bethune, PA-C 12/17/2018 8:18 PM

## 2018-12-17 NOTE — Patient Instructions (Signed)
I value your feedback and entrusting us with your care. If you get a [facility] patient survey, I would appreciate you taking the time to let us know about your experience today. Thank you! 

## 2018-12-18 ENCOUNTER — Other Ambulatory Visit: Payer: Self-pay

## 2018-12-18 MED ORDER — CLINDAMYCIN HCL 300 MG PO CAPS: 300.0000 mg | ORAL_CAPSULE | Freq: Three times a day (TID) | ORAL | 0 refills | Status: AC

## 2018-12-18 NOTE — Progress Notes (Signed)
Pharmacy does not have erythromycin. Changed to clindamycin 300mg  TID 10 days.

## 2018-12-23 ENCOUNTER — Encounter (INDEPENDENT_AMBULATORY_CARE_PROVIDER_SITE_OTHER): Payer: Self-pay

## 2018-12-24 ENCOUNTER — Other Ambulatory Visit: Payer: Self-pay

## 2018-12-25 IMAGING — CT CT RENAL STONE PROTOCOL
2 of 4 series · 16 of 46 positions shown, 18 images · non-contrast
Comparison: 04/12/2017

CLINICAL DATA: Right-sided flank pain

EXAM:
CT ABDOMEN AND PELVIS WITHOUT CONTRAST
TECHNIQUE: Multidetector CT imaging of the abdomen and pelvis was performed
following the standard protocol without IV contrast.

[Series 2: stone full standard · axial · 0.68mm/px · z∈[-406,+14]mm · 13 of 92 slices shown, 15 images]
[im 4/92  soft-tissue]
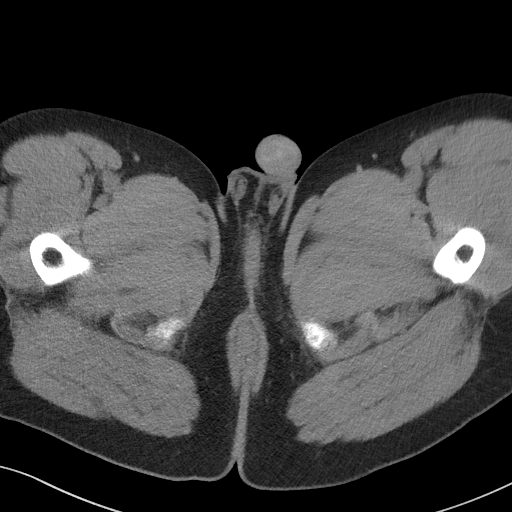
[im 4/92  bone]
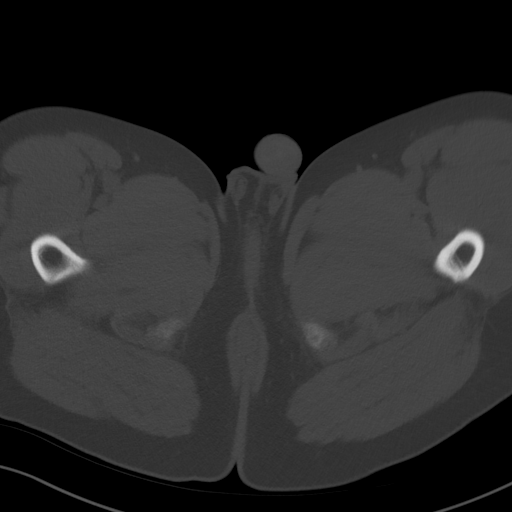
[im 11/92  soft-tissue]
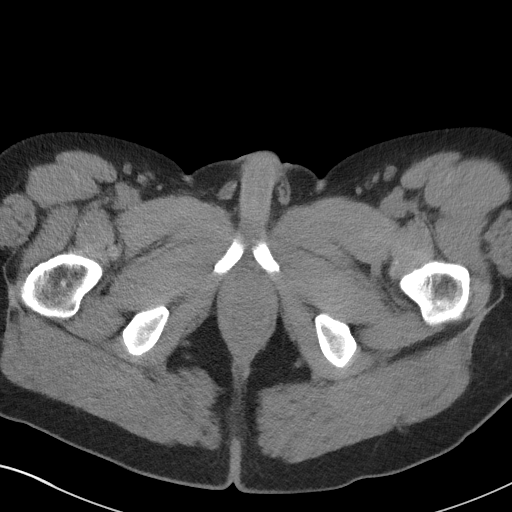
[im 19/92  soft-tissue]
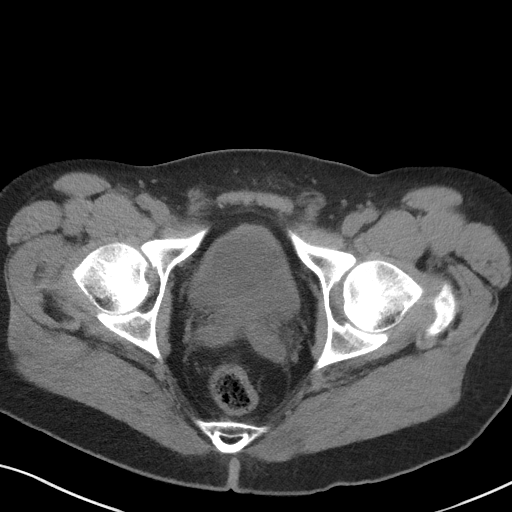
[im 26/92  soft-tissue]
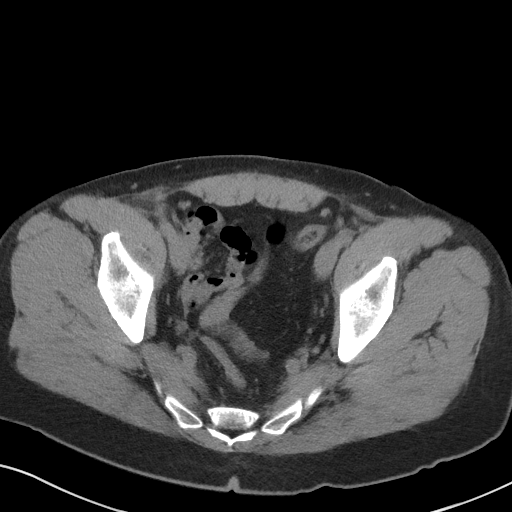
[im 33/92  soft-tissue]
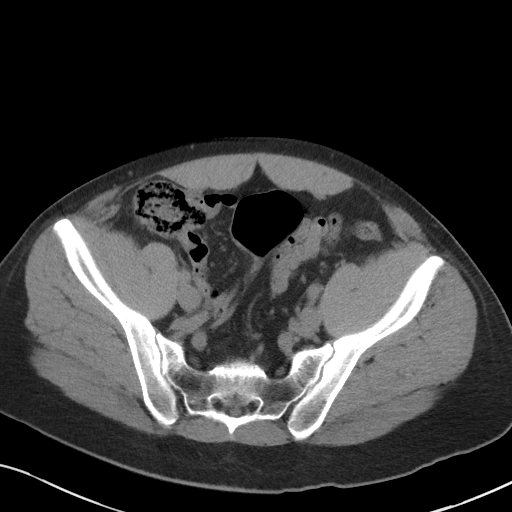
[im 41/92  soft-tissue]
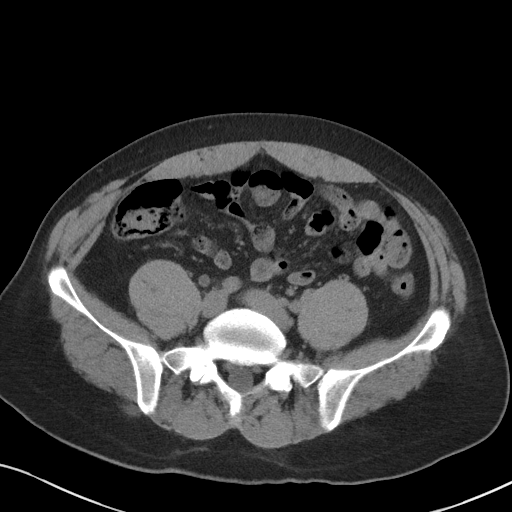
[im 48/92  soft-tissue]
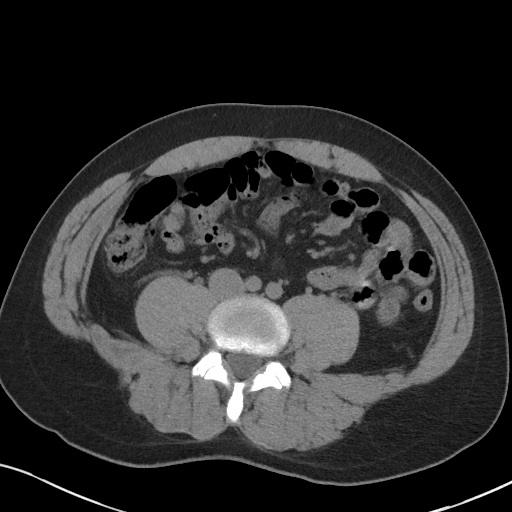
[im 51/92  soft-tissue]
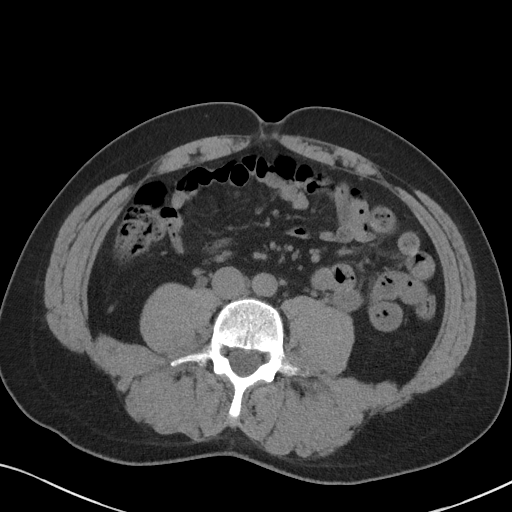
[im 59/92  soft-tissue]
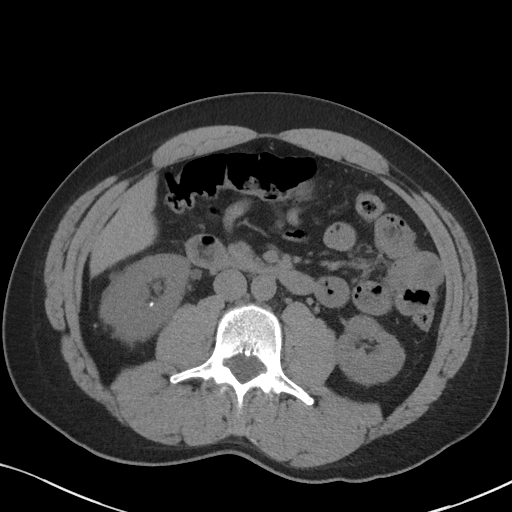
[im 59/92  bone]
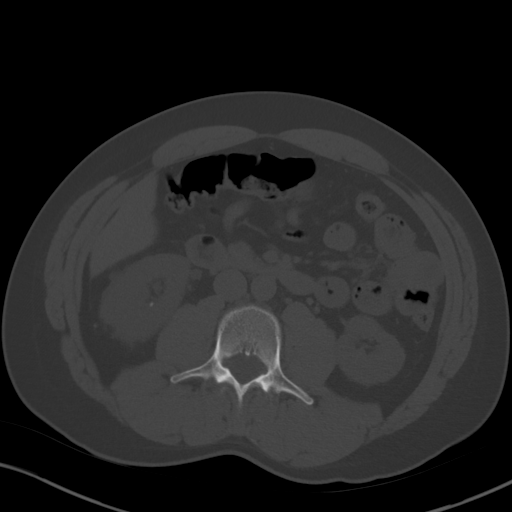
[im 66/92  soft-tissue]
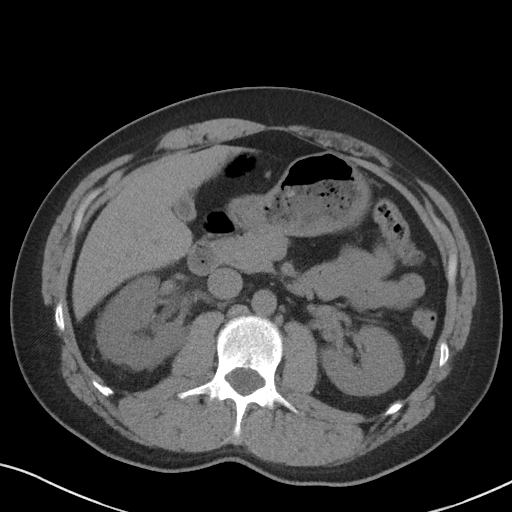
[im 73/92  soft-tissue]
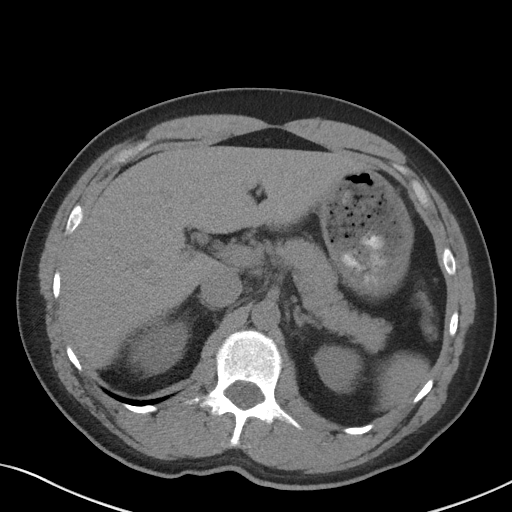
[im 81/92  soft-tissue]
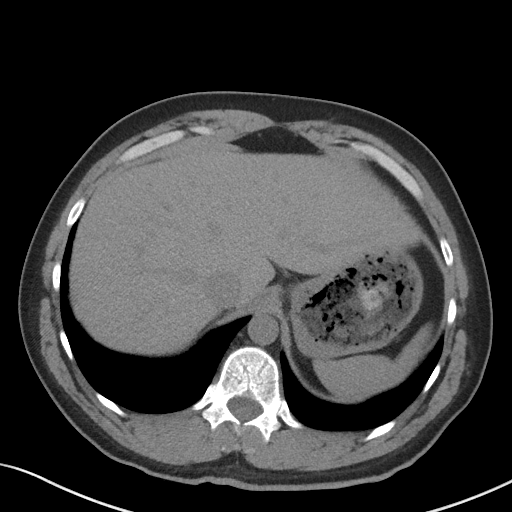
[im 88/92  soft-tissue]
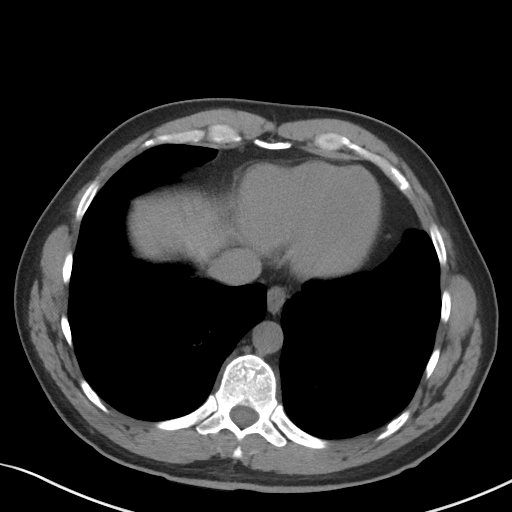

[Series 5: coronal · coronal · 0.84mm/px · 3 of 132 slices shown]
[im 44/132  soft-tissue]
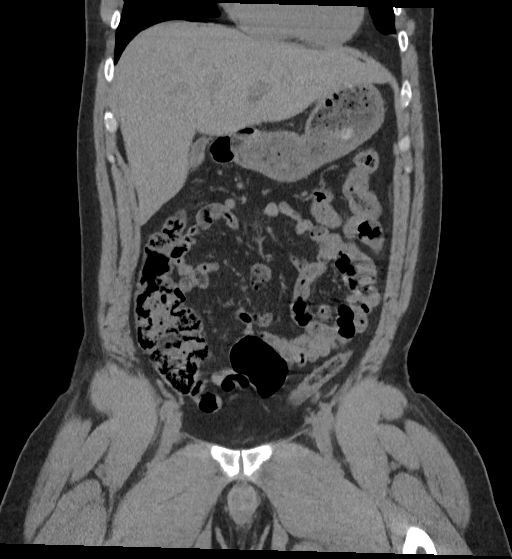
[im 59/132  soft-tissue]
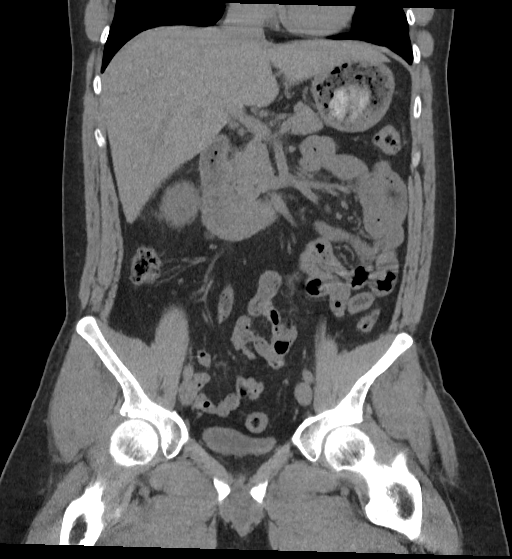
[im 73/132  soft-tissue]
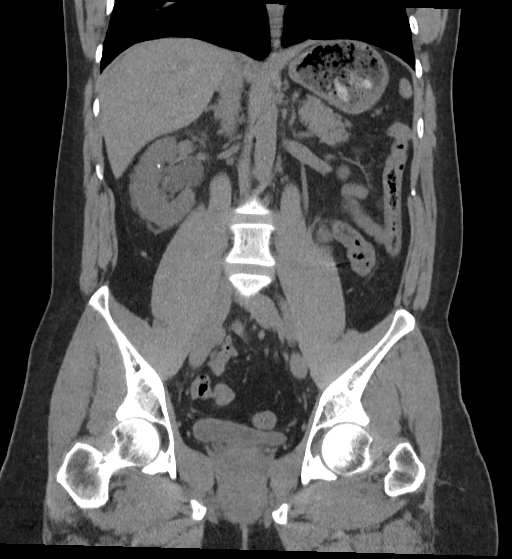

[16 of 46 positions shown; findings below may reference images not displayed]

FINDINGS: Lower chest: Unremarkable.

Hepatobiliary: No focal abnormality within the liver parenchyma.
Gallbladder decompressed. No intrahepatic or extrahepatic biliary
dilation.

Pancreas: No focal mass lesion. No dilatation of the main duct. No
intraparenchymal cyst. No peripancreatic edema.

Spleen: No splenomegaly. No focal mass lesion.

Adrenals/Urinary Tract: No adrenal nodule or mass. 3 mm stone
identified interpolar right kidney with another 3 mm nonobstructing
stone seen in the lower pole the right kidney. There is mild to
moderate right hydronephrosis with perinephric and periureteric
edema. 4 x 3 mm stone is identified in the distal right ureter
(71/2) approximately 1 cm above the UVJ.

2 stones are seen in the left kidney on axial imaging. The larger of
the 2 measures 4 mm on [DATE]. No left ureteral stone.

No bladder stones.

Stomach/Bowel: Stomach is nondistended. No gastric wall thickening.
No evidence of outlet obstruction. Duodenum is normally positioned
as is the ligament of Treitz. No small bowel wall thickening. No
small bowel dilatation. The terminal ileum is normal. The appendix
is not visualized, but there is no edema or inflammation in the
region of the cecum. No gross colonic mass. No colonic wall
thickening.

Vascular/Lymphatic: No abdominal aortic aneurysm. No abdominal
aortic atherosclerotic calcification. There is no gastrohepatic or
hepatoduodenal ligament lymphadenopathy. No intraperitoneal or
retroperitoneal lymphadenopathy. No pelvic sidewall lymphadenopathy.

Reproductive: The prostate gland and seminal vesicles have normal
imaging features.

Other: No intraperitoneal free fluid.

Musculoskeletal: No worrisome lytic or sclerotic osseous
abnormality.
IMPRESSION: 1. 4 x 3 mm stone in the distal right ureter causes mild to moderate
right hydroureteronephrosis.
2. Bilateral nonobstructing nephrolithiasis.

## 2019-01-19 ENCOUNTER — Telehealth: Payer: Self-pay | Admitting: Pharmacy Technician

## 2019-01-19 NOTE — Telephone Encounter (Signed)
Have attempted to contact patient several times.  Patient is living at [employer] [facility].  No return phone calls.  Patient indicated that he is married.  Need to find out what type of income wife has.  Also, patient needs to sign [facility]'s contract, [facility]H Attestation, 8115-B and Patient Advocate Letter.  Patient was provided a 30 day supply of medications.  No additional medications can be provided without verifying eligibility.  Patient notified by letter.  Sherilyn Dacosta Care Manager [facility]

## 2019-02-22 ENCOUNTER — Other Ambulatory Visit: Payer: Self-pay

## 2019-02-22 ENCOUNTER — Emergency Department
Admission: EM | Admit: 2019-02-22 | Discharge: 2019-02-22 | Disposition: A | Discharge status: Home / Self Care | Payer: Medicaid Other | Attending: Emergency Medicine | Admitting: Emergency Medicine

## 2019-02-22 ENCOUNTER — Encounter: Payer: Self-pay | Admitting: Emergency Medicine

## 2019-02-22 DIAGNOSIS — M5442 Lumbago with sciatica, left side: Secondary | ICD-10-CM | POA: Insufficient documentation

## 2019-02-22 DIAGNOSIS — I509 Heart failure, unspecified: Secondary | ICD-10-CM | POA: Insufficient documentation

## 2019-02-22 DIAGNOSIS — Z79899 Other long term (current) drug therapy: Secondary | ICD-10-CM | POA: Insufficient documentation

## 2019-02-22 DIAGNOSIS — Z87891 Personal history of nicotine dependence: Secondary | ICD-10-CM | POA: Insufficient documentation

## 2019-02-22 DIAGNOSIS — I110 Hypertensive heart disease with heart failure: Secondary | ICD-10-CM | POA: Insufficient documentation

## 2019-02-22 DIAGNOSIS — M5441 Lumbago with sciatica, right side: Secondary | ICD-10-CM | POA: Insufficient documentation

## 2019-02-22 MED ORDER — MELOXICAM 15 MG PO TABS: 15.0000 mg | ORAL_TABLET | Freq: Every day | ORAL | 1 refills | Status: AC

## 2019-02-22 MED ORDER — CYCLOBENZAPRINE HCL 10 MG PO TABS: 10.0000 mg | ORAL_TABLET | Freq: Three times a day (TID) | ORAL | 0 refills | Status: AC | PRN

## 2019-02-22 NOTE — ED Triage Notes (Addendum)
Patient ambulatory to triage with steady gait, without difficulty or distress noted; pt reports injuring back today peforming heavy lifting; st hx of same; took tylenol PTA without relief

## 2019-02-22 NOTE — ED Provider Notes (Signed)
[facility] Emergency Department Provider Note  ____________________________________________  Time seen: Approximately 8:55 PM  I have reviewed the triage vital signs and the nursing notes.   HISTORY  Chief Complaint Back Pain    HPI Randall Parsons is a 38 y.o. male with a history of polysubstance abuse, presents to the emergency department with bilateral low back pain that radiates into the buttocks after several episodes of heavy lifting at work.  Patient denies bowel or bladder incontinence or saddle anesthesia.  No fever or recent history of IV drug use.  Patient states that he has tried Tylenol today but that has not relieved his pain.  He rates his pain at 8 out of 10 in intensity.  He presents to the emergency department for pain management.        Past Medical History:  Diagnosis Date  . CHF (congestive heart failure) (HCC)   . Exposure to hepatitis B   . Exposure to hepatitis C   . Hepatitis C   . 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, ending around 2017  . Hypertension   . Renal disorder    kidney stones    Patient Active Problem List   Diagnosis Date Noted  . Chest pain 11/28/2017  . Essential hypertension 11/28/2017  . Low back pain 11/28/2017  . NICM (nonischemic cardiomyopathy) (HCC) 11/28/2017  . Nephrolithiasis 11/28/2017    Past Surgical History:  Procedure Laterality Date  . APPENDECTOMY    . EYE SURGERY     secondary to dog bite    Prior to Admission medications   Medication Sig Start Date End Date Taking? Authorizing Provider  amphetamine-dextroamphetamine (ADDERALL) 20 MG tablet Take 20 mg by mouth 2 (two) times daily.    [provider]  cyclobenzaprine (FLEXERIL) 10 MG tablet Take 1 tablet (10 mg total) by mouth 3 (three) times daily as needed for up to 5 days. 02/22/19 02/27/19  Orvil Feil, PA-C  docusate sodium (COLACE) 100 MG capsule Take 1 tablet once or twice daily as needed for  constipation while taking narcotic pain medicine Patient not taking: Reported on 12/17/2018 09/13/18   Loleta Rose, MD  ketorolac (TORADOL) 10 MG tablet Take 1 tablet (10 mg total) by mouth every 8 (eight) hours as needed for severe pain. Patient not taking: Reported on 12/17/2018 09/12/18   Phineas Semen, MD  meloxicam (MOBIC) 15 MG tablet Take 1 tablet (15 mg total) by mouth daily for 7 days. 02/22/19 03/01/19  Orvil Feil, PA-C  oxyCODONE-acetaminophen (PERCOCET) 5-325 MG tablet Take 1-2 tablets by mouth every 6 (six) hours as needed for severe pain. Patient not taking: Reported on 12/17/2018 09/13/18   Loleta Rose, MD  PARoxetine (PAXIL) 20 MG tablet Take 40 mg by mouth daily.    [provider]  promethazine (PHENERGAN) 25 MG tablet Take 1 tablet (25 mg total) by mouth every 6 (six) hours as needed for nausea or vomiting. Patient not taking: Reported on 12/17/2018 09/12/18   Phineas Semen, MD  tamsulosin (FLOMAX) 0.4 MG CAPS capsule Take 1 capsule (0.4 mg total) by mouth daily. Patient not taking: Reported on 12/17/2018 09/12/18   Phineas Semen, MD    Allergies Amoxicillin  Family History  Problem Relation Age of Onset  . Hypertension Father     Social History Social History   Tobacco Use  . Smoking status: Former Games developer  . Smokeless tobacco: Never Used  Substance Use Topics  . Alcohol use:  No  . Drug use: Not Currently    Types: IV    Comment: hx of heroin abuse (opioid addiction)     Review of Systems  Constitutional: No fever/chills Eyes: No visual changes. No discharge ENT: No upper respiratory complaints. Cardiovascular: no chest pain. Respiratory: no cough. No SOB. Gastrointestinal: No abdominal pain.  No nausea, no vomiting.  No diarrhea.  No constipation. Genitourinary: Negative for dysuria. No hematuria Musculoskeletal: Patient has low back pain.  Skin: Negative for rash, abrasions, lacerations, ecchymosis. Neurological: Negative for  headaches, focal weakness or numbness.   ____________________________________________   PHYSICAL EXAM:  VITAL SIGNS: ED Triage Vitals  Enc Vitals Group     BP 02/22/19 2009 135/83     Pulse Rate 02/22/19 2009 75     Resp 02/22/19 2009 20     Temp 02/22/19 2009 98.6 F (37 C)     Temp Source 02/22/19 2009 Oral     SpO2 02/22/19 2009 97 %     Weight 02/22/19 1952 196 lb (88.9 kg)     Height 02/22/19 1952 5\' 9"  (1.753 m)     Head Circumference --      Peak Flow --      Pain Score 02/22/19 1952 9     Pain Loc --      Pain Edu? --      Excl. in GC? --      Constitutional: Alert and oriented. Well appearing and in no acute distress. Eyes: Conjunctivae are normal. PERRL. EOMI. Head: Atraumatic. Cardiovascular: Normal rate, regular rhythm. Normal S1 and S2.  Good peripheral circulation. Respiratory: Normal respiratory effort without tachypnea or retractions. Lungs CTAB. Good air entry to the bases with no decreased or absent breath sounds. Musculoskeletal: Full range of motion to all extremities. No gross deformities appreciated.  Patient has paraspinal muscle tenderness along the lumbar spine. Neurologic:  Normal speech and language. No gross focal neurologic deficits are appreciated.  Skin:  Skin is warm, dry and intact. No rash noted. Psychiatric: Mood and affect are normal. Speech and behavior are normal. Patient exhibits appropriate insight and judgement.   ____________________________________________   LABS (all labs ordered are listed, but only abnormal results are displayed)  Labs Reviewed - No data to display ____________________________________________  EKG   ____________________________________________  RADIOLOGY   No results found.  ____________________________________________    PROCEDURES  Procedure(s) performed:    Procedures    Medications - No data to display   ____________________________________________   INITIAL IMPRESSION /  ASSESSMENT AND PLAN / ED COURSE  Pertinent labs & imaging results that were available during my care of the patient were reviewed by me and considered in my medical decision making (see chart for details).  Review of the [place] CSRS was performed in accordance of the [facility] prior to dispensing any controlled drugs.           Assessment and plan Low back pain 38 year old male presents to the emergency department with acute low back pain that radiates into the bilateral buttocks for the past 24 hours.  Patient was discharged with meloxicam and Flexeril.  Was advised to follow-up with primary care as needed.  All patient questions were answered.    ____________________________________________  FINAL CLINICAL IMPRESSION(S) / ED DIAGNOSES  Final diagnoses:  Acute bilateral low back pain with bilateral sciatica      NEW MEDICATIONS STARTED DURING THIS VISIT:  ED Discharge Orders         Ordered  cyclobenzaprine (FLEXERIL) 10 MG tablet  3 times daily PRN     02/22/19 2053    meloxicam (MOBIC) 15 MG tablet  Daily     02/22/19 2053              This chart was dictated using voice recognition software/Dragon. Despite best efforts to proofread, errors can occur which can change the meaning. Any change was purely unintentional.    Orvil Feil, PA-C 02/22/19 6283    Arnaldo Natal, MD 02/22/19 2352

## 2019-04-16 ENCOUNTER — Emergency Department (HOSPITAL_COMMUNITY)
Admission: EM | Admit: 2019-04-16 | Discharge: 2019-04-17 | Disposition: A | Discharge status: Home / Self Care | Payer: Medicaid Other | Attending: Emergency Medicine | Admitting: Emergency Medicine

## 2019-04-16 ENCOUNTER — Other Ambulatory Visit: Payer: Self-pay

## 2019-04-16 ENCOUNTER — Emergency Department (HOSPITAL_COMMUNITY): Payer: Medicaid Other

## 2019-04-16 ENCOUNTER — Encounter (HOSPITAL_COMMUNITY): Payer: Self-pay | Admitting: Emergency Medicine

## 2019-04-16 DIAGNOSIS — R0789 Other chest pain: Secondary | ICD-10-CM

## 2019-04-16 DIAGNOSIS — E860 Dehydration: Secondary | ICD-10-CM | POA: Insufficient documentation

## 2019-04-16 DIAGNOSIS — N179 Acute kidney failure, unspecified: Secondary | ICD-10-CM

## 2019-04-16 DIAGNOSIS — B182 Chronic viral hepatitis C: Secondary | ICD-10-CM | POA: Insufficient documentation

## 2019-04-16 DIAGNOSIS — I509 Heart failure, unspecified: Secondary | ICD-10-CM | POA: Insufficient documentation

## 2019-04-16 DIAGNOSIS — I10 Essential (primary) hypertension: Secondary | ICD-10-CM | POA: Insufficient documentation

## 2019-04-16 DIAGNOSIS — Z87891 Personal history of nicotine dependence: Secondary | ICD-10-CM | POA: Insufficient documentation

## 2019-04-16 DIAGNOSIS — Z79899 Other long term (current) drug therapy: Secondary | ICD-10-CM | POA: Insufficient documentation

## 2019-04-16 DIAGNOSIS — M791 Myalgia, unspecified site: Secondary | ICD-10-CM

## 2019-04-16 LAB — CBC
HCT: 47 % (ref 39.0–52.0)
Hemoglobin: 15.8 g/dL (ref 13.0–17.0)
MCH: 30.2 pg (ref 26.0–34.0)
MCHC: 33.6 g/dL (ref 30.0–36.0)
MCV: 89.9 fL (ref 80.0–100.0)
Platelets: 320 10*3/uL (ref 150–400)
RBC: 5.23 MIL/uL (ref 4.22–5.81)
RDW: 13.6 % (ref 11.5–15.5)
WBC: 9.2 10*3/uL (ref 4.0–10.5)
nRBC: 0 % (ref 0.0–0.2)

## 2019-04-16 LAB — BASIC METABOLIC PANEL
Anion gap: 15 (ref 5–15)
BUN: 25 mg/dL — ABNORMAL HIGH (ref 6–20)
CO2: 23 mmol/L (ref 22–32)
Calcium: 10 mg/dL (ref 8.9–10.3)
Chloride: 95 mmol/L — ABNORMAL LOW (ref 98–111)
Creatinine, Ser: 1.85 mg/dL — ABNORMAL HIGH (ref 0.61–1.24)
GFR calc Af Amer: 52 mL/min — ABNORMAL LOW (ref >60)
GFR calc non Af Amer: 45 mL/min — ABNORMAL LOW (ref >60)
Glucose, Bld: 99 mg/dL (ref 70–99)
Potassium: 4.5 mmol/L (ref 3.5–5.1)
Sodium: 133 mmol/L — ABNORMAL LOW (ref 135–145)

## 2019-04-16 LAB — TROPONIN I (HIGH SENSITIVITY): Troponin I (High Sensitivity): 6 ng/L (ref <18)

## 2019-04-16 MED ORDER — SODIUM CHLORIDE 0.9 % IV BOLUS
1000.0000 mL | Freq: Once | INTRAVENOUS | Status: AC
  Administered 2019-04-16: 1000 mL via INTRAVENOUS

## 2019-04-16 MED ORDER — SODIUM CHLORIDE 0.9% FLUSH: 3.0000 mL | Freq: Once | INTRAVENOUS | Status: DC

## 2019-04-16 MED ORDER — LORAZEPAM 1 MG PO TABS
0.5000 mg | ORAL_TABLET | Freq: Once | ORAL | Status: AC
  Administered 2019-04-16: 0.5 mg via ORAL
  Filled 2019-04-16: qty 1

## 2019-04-16 NOTE — ED Notes (Signed)
Attempted to obtain IV access x2. No success, second RN to attempt

## 2019-04-16 NOTE — ED Triage Notes (Signed)
pt reports he works outside [employer]. {pt reports he started having muscle cramping and tightness in his chest. Pt reports hx of CHF. Pt reports he has been drinking plenty of fluids.

## 2019-04-16 NOTE — ED Provider Notes (Signed)
[facility] EMERGENCY DEPARTMENT Provider Note   CSN: [id number] Arrival date & time: 04/16/19  1745     History   Chief Complaint Chief Complaint  Patient presents with   Chest Pain    HPI Randall Parsons is a 38 y.o. male.     History of hypertension, nonischemic cardiomyopathy, CHF, reported MI by his report presenting with diffuse body aches all over since about 10:30 AM.  States he was outside for about 8 or 9 hours today working on a deck and believes he had too much heat exposure.  He developed cramping in his arms, legs and hands.  He also developed some tightness in his chest that has been persistent since about 3 PM.  He reports the pain is constant in his chest and does not radiate.  There is some associated shortness of breath.  No cough or fever.  He try to stay hydrated while he was outside and believes he was drinking enough fluids.  He denies any pain with urination but does have decreased urine output.  Reports history of CHF but does not take any diuretics.  Reports he was told he had an MI in the past but never got any stents. No headache or neck pain.  The history is provided by the patient.  Chest Pain Associated symptoms: fatigue, shortness of breath and weakness   Associated symptoms: no abdominal pain, no dizziness, no fever, no headache, no nausea and no vomiting     Past Medical History:  Diagnosis Date   CHF (congestive heart failure) ([place])    Exposure to hepatitis B    Exposure to hepatitis C    Hepatitis C    History of opioid abuse ([place])    reports heroin abuse, ending around 2017   Hypertension    Renal disorder    kidney stones    Patient Active Problem List   Diagnosis Date Noted   Chest pain 11/28/2017   Essential hypertension 11/28/2017   Low back pain 11/28/2017   NICM (nonischemic cardiomyopathy) ([place]) 11/28/2017   Nephrolithiasis 11/28/2017    Past Surgical History:  Procedure Laterality Date    APPENDECTOMY     EYE SURGERY     secondary to dog bite        Home Medications    Prior to Admission medications   Medication Sig Start Date End Date Taking? Authorizing Provider  amphetamine-dextroamphetamine (ADDERALL) 20 MG tablet Take 20 mg by mouth 2 (two) times daily.    [provider]  docusate sodium (COLACE) 100 MG capsule Take 1 tablet once or twice daily as needed for constipation while taking narcotic pain medicine Patient not taking: Reported on 12/17/2018 09/13/18   Hinda Kehr, MD  ketorolac (TORADOL) 10 MG tablet Take 1 tablet (10 mg total) by mouth every 8 (eight) hours as needed for severe pain. Patient not taking: Reported on 12/17/2018 09/12/18   Nance Pear, MD  oxyCODONE-acetaminophen (PERCOCET) 5-325 MG tablet Take 1-2 tablets by mouth every 6 (six) hours as needed for severe pain. Patient not taking: Reported on 12/17/2018 09/13/18   Hinda Kehr, MD  PARoxetine (PAXIL) 20 MG tablet Take 40 mg by mouth daily.    [provider]  promethazine (PHENERGAN) 25 MG tablet Take 1 tablet (25 mg total) by mouth every 6 (six) hours as needed for nausea or vomiting. Patient not taking: Reported on 12/17/2018 09/12/18   Nance Pear, MD  tamsulosin (FLOMAX) 0.4 MG CAPS capsule Take 1 capsule (  0.4 mg total) by mouth daily. Patient not taking: Reported on 12/17/2018 09/12/18   Phineas Semen, MD    Family History Family History  Problem Relation Age of Onset   Hypertension Father     Social History Social History   Tobacco Use   Smoking status: Former Smoker   Smokeless tobacco: Never Used  Substance Use Topics   Alcohol use: No   Drug use: Not Currently    Types: IV    Comment: hx of heroin abuse (opioid addiction)     Allergies   Amoxicillin   Review of Systems Review of Systems  Constitutional: Positive for activity change and fatigue. Negative for appetite change and fever.  HENT: Negative for congestion and rhinorrhea.     Eyes: Negative for visual disturbance.  Respiratory: Positive for chest tightness and shortness of breath.   Cardiovascular: Positive for chest pain.  Gastrointestinal: Negative for abdominal pain, nausea and vomiting.  Genitourinary: Negative for dysuria and hematuria.  Musculoskeletal: Positive for arthralgias and myalgias.  Skin: Negative for rash.  Neurological: Positive for weakness. Negative for dizziness and headaches.    all other systems are negative except as noted in the HPI and PMH.    Physical Exam Updated Vital Signs BP (!) 141/94    Pulse 73    Temp 98.4 F (36.9 C) (Oral)    Resp (!) 21    Ht 5\' 9"  (1.753 m)    Wt 88.9 kg    SpO2 98%    BMI 28.94 kg/m   Physical Exam Vitals signs and nursing note reviewed.  Constitutional:      General: He is not in acute distress.    Appearance: He is well-developed.     Comments: Diffuse erythema to skin  HENT:     Head: Normocephalic and atraumatic.     Mouth/Throat:     Mouth: Mucous membranes are moist.     Pharynx: No oropharyngeal exudate.  Eyes:     Conjunctiva/sclera: Conjunctivae normal.     Pupils: Pupils are equal, round, and reactive to light.  Neck:     Musculoskeletal: Normal range of motion and neck supple.     Comments: No meningismus. Cardiovascular:     Rate and Rhythm: Normal rate and regular rhythm.     Heart sounds: Normal heart sounds. No murmur.  Pulmonary:     Effort: Pulmonary effort is normal. No respiratory distress.     Breath sounds: Normal breath sounds.  Chest:     Chest wall: No tenderness.  Abdominal:     Palpations: Abdomen is soft.     Tenderness: There is no abdominal tenderness. There is no guarding or rebound.  Musculoskeletal: Normal range of motion.        General: No tenderness.  Skin:    General: Skin is warm.     Capillary Refill: Capillary refill takes less than 2 seconds.  Neurological:     General: No focal deficit present.     Mental Status: [name]  to person, place, and time. Mental status is at baseline.     Cranial Nerves: No cranial nerve deficit.     Motor: No abnormal muscle tone.     Coordination: Coordination normal.     Comments: No ataxia on finger to nose bilaterally. No pronator drift. 5/5 strength throughout. CN 2-12 intact.Equal grip strength. Sensation intact.   Psychiatric:        Behavior: Behavior normal.  ED Treatments / Results  Labs (all labs ordered are listed, but only abnormal results are displayed) Labs Reviewed  BASIC METABOLIC PANEL - Abnormal; Notable for the following components:      Result Value   Sodium 133 (*)    Chloride 95 (*)    BUN 25 (*)    Creatinine, Ser 1.85 (*)    GFR calc non Af Amer 45 (*)    GFR calc Af Amer 52 (*)    All other components within normal limits  MAGNESIUM - Abnormal; Notable for the following components:   Magnesium 2.6 (*)    All other components within normal limits  CBC  CK  D-DIMER, QUANTITATIVE (NOT AT [facility])  URINALYSIS, ROUTINE W REFLEX MICROSCOPIC  RAPID URINE DRUG SCREEN, HOSP PERFORMED  TROPONIN I (HIGH SENSITIVITY)  TROPONIN I (HIGH SENSITIVITY)    EKG EKG Interpretation  Date/Time:  Friday April 16 2019 18:04:53 EDT Ventricular Rate:  89 PR Interval:  162 QRS Duration: 82 QT Interval:  334 QTC Calculation: 406 R Axis:   113 Text Interpretation:  Normal sinus rhythm Left posterior fascicular block Abnormal ECG Nonspecific T wave abnormality Confirmed by Glynn Octaveancour, Aisia Correira [phone]([id number]) on 04/16/2019 10:55:59 PM   Radiology Dg Chest 2 View  Result Date: 04/16/2019 CLINICAL DATA:  Muscle cramping.  Chest tightness. EXAM: CHEST - 2 VIEW COMPARISON:  August 22, 2018 FINDINGS: The heart size and mediastinal contours are within normal limits. Both lungs are clear. The visualized skeletal structures are unremarkable. IMPRESSION: No active cardiopulmonary disease. Electronically Signed   By: Gerome Samavid  Williams III M.D   On: 04/16/2019 19:10     Procedures Procedures (including critical care time)  Medications Ordered in ED Medications  sodium chloride flush (NS) 0.9 % injection 3 mL (has no administration in time range)  sodium chloride 0.9 % bolus 1,000 mL (has no administration in time range)  sodium chloride 0.9 % bolus 1,000 mL (has no administration in time range)     Initial Impression / Assessment and Plan / ED Course  I have reviewed the triage vital signs and the nursing notes.  Pertinent labs & imaging results that were available during my care of the patient were reviewed by me and considered in my medical decision making (see chart for details).       Patient with body aches after working outside today associated with chest tightness.  His EKG has a new T wave inversion in lead III only.  His initial troponin is negative.  Labs do show AKI.  Will hydrate and add on CK.  CK is within normal limits.  Troponin is negative x2.  D-dimer is negative.  Urine drug screen is negative.  Patient denies any recent cocaine use.  Reports improvement in his muscle cramps and chest tightness after hydration. Low suspicion for ACS, PE, aortic dissection.   Cramps have improved with IV hydration. Electrolytes reassuring. Troponins and D- dimer negative. CK normal.  D/w patient to limit heat exposure and increase hydration at home with electrolyte replacement solutions.  Followup with PCP. Recheck kidney function next week. Return precautions discussed.   Final Clinical Impressions(s) / ED Diagnoses   Final diagnoses:  Dehydration  Myalgia  AKI (acute kidney injury) (HCC)  Atypical chest pain    ED Discharge Orders    None       Lorre Opdahl, Jeannett SeniorStephen, MD 04/17/19 [phone]

## 2019-04-17 LAB — URINALYSIS, ROUTINE W REFLEX MICROSCOPIC
Bilirubin Urine: NEGATIVE
Glucose, UA: NEGATIVE mg/dL
Hgb urine dipstick: NEGATIVE
Ketones, ur: NEGATIVE mg/dL
Leukocytes,Ua: NEGATIVE
Nitrite: NEGATIVE
Protein, ur: NEGATIVE mg/dL
Specific Gravity, Urine: 1.014 (ref 1.005–1.030)
pH: 5 (ref 5.0–8.0)

## 2019-04-17 LAB — RAPID URINE DRUG SCREEN, HOSP PERFORMED
Amphetamines: NOT DETECTED
Barbiturates: NOT DETECTED
Benzodiazepines: NOT DETECTED
Cocaine: NOT DETECTED
Opiates: NOT DETECTED
Tetrahydrocannabinol: NOT DETECTED

## 2019-04-17 LAB — TROPONIN I (HIGH SENSITIVITY): Troponin I (High Sensitivity): 5 ng/L (ref <18)

## 2019-04-17 LAB — CK: Total CK: 309 U/L (ref 49–397)

## 2019-04-17 LAB — MAGNESIUM: Magnesium: 2.6 mg/dL — ABNORMAL HIGH (ref 1.7–2.4)

## 2019-04-17 LAB — D-DIMER, QUANTITATIVE: D-Dimer, Quant: <0.27 ug/mL-FEU (ref 0.00–0.50)

## 2019-04-17 MED ORDER — SODIUM CHLORIDE 0.9 % IV BOLUS
1000.0000 mL | Freq: Once | INTRAVENOUS | Status: AC
  Administered 2019-04-17: 1000 mL via INTRAVENOUS

## 2019-04-17 NOTE — ED Notes (Signed)
Pt eating and drinking

## 2019-04-17 NOTE — Discharge Instructions (Signed)
Keep yourself hydrated in the hot weather.  You should follow-up for had a recheck of your kidney function next week.  Return to the ED if your chest pain becomes exertional, associated shortness of breath, nausea, vomiting, sweating or any other concerns.

## 2019-04-17 NOTE — ED Notes (Signed)
Discharge instructions discussed with pt. Pt verbalized understanding. No questions at this time pt to go home with wife

## 2019-07-10 ENCOUNTER — Emergency Department (HOSPITAL_COMMUNITY): Payer: Self-pay

## 2019-07-10 ENCOUNTER — Other Ambulatory Visit: Payer: Self-pay

## 2019-07-10 ENCOUNTER — Emergency Department (HOSPITAL_COMMUNITY)
Admission: EM | Admit: 2019-07-10 | Discharge: 2019-07-12 | Disposition: A | Discharge status: Home / Self Care | Payer: Self-pay | Attending: Emergency Medicine | Admitting: Emergency Medicine

## 2019-07-10 DIAGNOSIS — Z79899 Other long term (current) drug therapy: Secondary | ICD-10-CM | POA: Insufficient documentation

## 2019-07-10 DIAGNOSIS — F10929 Alcohol use, unspecified with intoxication, unspecified: Secondary | ICD-10-CM | POA: Insufficient documentation

## 2019-07-10 DIAGNOSIS — R0789 Other chest pain: Secondary | ICD-10-CM | POA: Insufficient documentation

## 2019-07-10 DIAGNOSIS — I509 Heart failure, unspecified: Secondary | ICD-10-CM | POA: Insufficient documentation

## 2019-07-10 DIAGNOSIS — F1094 Alcohol use, unspecified with alcohol-induced mood disorder: Secondary | ICD-10-CM | POA: Diagnosis present

## 2019-07-10 DIAGNOSIS — Z20828 Contact with and (suspected) exposure to other viral communicable diseases: Secondary | ICD-10-CM | POA: Insufficient documentation

## 2019-07-10 DIAGNOSIS — F332 Major depressive disorder, recurrent severe without psychotic features: Secondary | ICD-10-CM | POA: Insufficient documentation

## 2019-07-10 DIAGNOSIS — F1410 Cocaine abuse, uncomplicated: Secondary | ICD-10-CM | POA: Diagnosis present

## 2019-07-10 DIAGNOSIS — F14929 Cocaine use, unspecified with intoxication, unspecified: Secondary | ICD-10-CM | POA: Insufficient documentation

## 2019-07-10 DIAGNOSIS — R0602 Shortness of breath: Secondary | ICD-10-CM | POA: Insufficient documentation

## 2019-07-10 DIAGNOSIS — F1010 Alcohol abuse, uncomplicated: Secondary | ICD-10-CM

## 2019-07-10 DIAGNOSIS — R079 Chest pain, unspecified: Secondary | ICD-10-CM

## 2019-07-10 DIAGNOSIS — Z87891 Personal history of nicotine dependence: Secondary | ICD-10-CM | POA: Insufficient documentation

## 2019-07-10 DIAGNOSIS — R45851 Suicidal ideations: Secondary | ICD-10-CM | POA: Insufficient documentation

## 2019-07-10 DIAGNOSIS — I110 Hypertensive heart disease with heart failure: Secondary | ICD-10-CM | POA: Insufficient documentation

## 2019-07-10 LAB — RAPID URINE DRUG SCREEN, HOSP PERFORMED
Amphetamines: NOT DETECTED
Barbiturates: NOT DETECTED
Benzodiazepines: NOT DETECTED
Cocaine: POSITIVE — AB
Opiates: NOT DETECTED
Tetrahydrocannabinol: NOT DETECTED

## 2019-07-10 LAB — HEPATIC FUNCTION PANEL
ALT: 50 U/L — ABNORMAL HIGH (ref 0–44)
AST: 32 U/L (ref 15–41)
Albumin: 4.2 g/dL (ref 3.5–5.0)
Alkaline Phosphatase: 71 U/L (ref 38–126)
Bilirubin, Direct: 0.2 mg/dL (ref 0.0–0.2)
Indirect Bilirubin: 0.9 mg/dL (ref 0.3–0.9)
Total Bilirubin: 1.1 mg/dL (ref 0.3–1.2)
Total Protein: 7.4 g/dL (ref 6.5–8.1)

## 2019-07-10 LAB — BASIC METABOLIC PANEL
Anion gap: 12 (ref 5–15)
BUN: 12 mg/dL (ref 6–20)
CO2: 22 mmol/L (ref 22–32)
Calcium: 9.6 mg/dL (ref 8.9–10.3)
Chloride: 103 mmol/L (ref 98–111)
Creatinine, Ser: 1.18 mg/dL (ref 0.61–1.24)
GFR calc Af Amer: >60 mL/min (ref >60)
GFR calc non Af Amer: >60 mL/min (ref >60)
Glucose, Bld: 89 mg/dL (ref 70–99)
Potassium: 3.9 mmol/L (ref 3.5–5.1)
Sodium: 137 mmol/L (ref 135–145)

## 2019-07-10 LAB — SALICYLATE LEVEL: Salicylate Lvl: <7 mg/dL (ref 2.8–30.0)

## 2019-07-10 LAB — CBC WITH DIFFERENTIAL/PLATELET
Abs Immature Granulocytes: 0.03 10*3/uL (ref 0.00–0.07)
Basophils Absolute: 0 10*3/uL (ref 0.0–0.1)
Basophils Relative: 1 %
Eosinophils Absolute: 0 10*3/uL (ref 0.0–0.5)
Eosinophils Relative: 1 %
HCT: 44.3 % (ref 39.0–52.0)
Hemoglobin: 15.3 g/dL (ref 13.0–17.0)
Immature Granulocytes: 0 %
Lymphocytes Relative: 26 %
Lymphs Abs: 2.2 10*3/uL (ref 0.7–4.0)
MCH: 31.5 pg (ref 26.0–34.0)
MCHC: 34.5 g/dL (ref 30.0–36.0)
MCV: 91.3 fL (ref 80.0–100.0)
Monocytes Absolute: 1 10*3/uL (ref 0.1–1.0)
Monocytes Relative: 13 %
Neutro Abs: 4.9 10*3/uL (ref 1.7–7.7)
Neutrophils Relative %: 59 %
Platelets: 309 10*3/uL (ref 150–400)
RBC: 4.85 MIL/uL (ref 4.22–5.81)
RDW: 13.8 % (ref 11.5–15.5)
WBC: 8.2 10*3/uL (ref 4.0–10.5)
nRBC: 0 % (ref 0.0–0.2)

## 2019-07-10 LAB — TROPONIN I (HIGH SENSITIVITY)
Troponin I (High Sensitivity): 5 ng/L (ref <18)
Troponin I (High Sensitivity): 5 ng/L (ref <18)

## 2019-07-10 LAB — ETHANOL: Alcohol, Ethyl (B): <10 mg/dL (ref <10)

## 2019-07-10 LAB — ACETAMINOPHEN LEVEL: Acetaminophen (Tylenol), Serum: <10 ug/mL — ABNORMAL LOW (ref 10–30)

## 2019-07-10 LAB — BRAIN NATRIURETIC PEPTIDE: B Natriuretic Peptide: 23.4 pg/mL (ref 0.0–100.0)

## 2019-07-10 MED ORDER — ACETAMINOPHEN 325 MG PO TABS: 650.0000 mg | ORAL_TABLET | ORAL | Status: DC | PRN

## 2019-07-10 MED ORDER — MELOXICAM 7.5 MG PO TABS
15.0000 mg | ORAL_TABLET | Freq: Every day | ORAL | Status: DC | PRN
  Administered 2019-07-11: 15 mg via ORAL
  Filled 2019-07-10: qty 2

## 2019-07-10 MED ORDER — LORAZEPAM 1 MG PO TABS
1.0000 mg | ORAL_TABLET | ORAL | Status: DC | PRN
  Administered 2019-07-11 – 2019-07-12 (×3): 1 mg via ORAL
  Filled 2019-07-10 (×3): qty 1

## 2019-07-10 MED ORDER — LORAZEPAM 2 MG/ML IJ SOLN: 1.0000 mg | INTRAMUSCULAR | Status: DC | PRN

## 2019-07-10 MED ORDER — ASPIRIN 81 MG PO CHEW
324.0000 mg | CHEWABLE_TABLET | Freq: Once | ORAL | Status: AC
  Administered 2019-07-10: 324 mg via ORAL
  Filled 2019-07-10: qty 4

## 2019-07-10 MED ORDER — SODIUM CHLORIDE 0.9 % IV BOLUS
1000.0000 mL | Freq: Once | INTRAVENOUS | Status: AC
  Administered 2019-07-10: 1000 mL via INTRAVENOUS

## 2019-07-10 MED ORDER — ONDANSETRON HCL 4 MG PO TABS: 4.0000 mg | ORAL_TABLET | Freq: Three times a day (TID) | ORAL | Status: DC | PRN

## 2019-07-10 MED ORDER — LORAZEPAM 2 MG/ML IJ SOLN
1.0000 mg | Freq: Once | INTRAMUSCULAR | Status: AC
  Administered 2019-07-10: 20:00:00 1 mg via INTRAVENOUS
  Filled 2019-07-10: qty 1

## 2019-07-10 MED ORDER — NITROGLYCERIN 0.4 MG SL SUBL
0.4000 mg | SUBLINGUAL_TABLET | Freq: Once | SUBLINGUAL | Status: AC
  Administered 2019-07-10: 0.4 mg via SUBLINGUAL
  Filled 2019-07-10: qty 1

## 2019-07-10 MED ORDER — HYDROXYZINE HCL 10 MG PO TABS
25.0000 mg | ORAL_TABLET | Freq: Every day | ORAL | Status: DC
  Administered 2019-07-11 (×2): 25 mg via ORAL
  Filled 2019-07-10 (×3): qty 3

## 2019-07-10 NOTE — ED Provider Notes (Signed)
Patient meets inpatient criteria per [facility].   Ezequiel Essex, MD 07/10/19 [phone]

## 2019-07-10 NOTE — BH Assessment (Signed)
Tele Assessment Note   Patient Name: Lindwood Mogel MRN: [id number] Referring Physician: Alvester Chou, MD Location of Patient: [facility] Location of Provider: Behavioral Health TTS Department  Lealand Elting is an 38 y.o. male.  -Clinician reviewed note by Dr. Renaye Rakers.  Aside from these issues the patient reports that he feels very depressed today.  He reports that he lost his house his wife and potentially his car in the past 24 hours.  He says he feels depressed and thinks about suicide.  He endorses passive suicidal ideation, but denies that he has an active plan to kill himself. Also reports difficulties with alcohol abuse.  He states he been drinking nearly 1 case of beer daily for the past month.  He said his last drink was approximately 2 days ago.  He states that he is feeling withdrawal symptoms of shakiness and anger and nerves.  He denies any history of alcohol withdrawal seizures.  He is interested in getting placed in a detox program.  He reports that he went on a cocaine smoking binge for the past 24 hours.  He says he smoked crack cocaine throughout the entire evening into early this morning.    Patient says that he had been sober for several months.  He had been sober for a year up to last November (2019).  He said, "I have slipped up here and there and used crack & ETOH."  He reports using crack cocaine from Friday night (07/09/19) through this morning.  He said he had gotten paid in cash and it was too tempting.  He has been also drinking up to a case of beer daily and liquor too.  His last drink was Thursday night (10/01) however.  He said he usually has sweats, tremors, irritability when he is withdrawing.  Patient says that he has not had any seizures before.  Patient also said that he has not slept in over 28 hours.  In the last 3 weeks he has been getting little sleep, racing thoughts at night.  Patient said that his wife is telling him she is divorcing him.  He laments the fact that he  is about to lose his wife, vehicle and home because of his addiction.  Patient says he is depressed enough to try to kill himself.  He does have a plan to shoot himself in the head with a gun he can get from a friend.  Pt does not feel safe at this time.  Pt eye contact is good.  He is somewhat restless.  He reports a lot of anxiety.  Affect is congruent with stated mood.  Pt is oriented x4.  Speech is rapid and loud but not agitated.  Patient thought process is logical and coherent.  Patient says that he has had trouble sleeping and went to [facility] on 07/08/19 to get medication.  He had not been to [facility] in over a year.  Patient have been inpatient with several substance abuse programs. His most recent program was with [facility]. He was with them two times. He also have been with [facility] (1997), [facility] (2x-2019), [facility] [phone]), [facility] (1996), [facility] (4x).   -Clinician discussed patient care with Nira Conn, FNP who recommends inpatient psychiatric care.  Patient is interested in going to [facility] house when he is discharged from psychiatric facility.  AC Randa Evens said there were no appropriate beds at [facility] at this time.  TTS to refer patient out.  Dr. Manus Gunning  notified of disposition.  Diagnosis: MDD single episode severe; ETOH use d/o severe; Cocaine use d/o severe  Past Medical History:  Past Medical History:  Diagnosis Date  . CHF (congestive heart failure) ([place])   . Exposure to hepatitis B   . Exposure to hepatitis C   . Hepatitis C   . History of opioid abuse ([place])    reports heroin abuse, ending around 2017  . Hypertension   . Renal disorder    kidney stones    Past Surgical History:  Procedure Laterality Date  . APPENDECTOMY    . EYE SURGERY     secondary to dog bite    Family History:  Family History  Problem Relation Age of Onset  . Hypertension Father     Social History:  reports that he has quit smoking. He has never  used smokeless tobacco. He reports previous drug use. Drug: IV. He reports that he does not drink alcohol.  Additional Social History:  Alcohol / Drug Use Pain Medications: None Prescriptions: Vistaril and clonidine (took them the night before last) Over the Counter: Tylenol & multi vitamin History of alcohol / drug use?: Yes Longest period of sobriety (when/how long): Celebrated a year of sobriety in October 2019. Withdrawal Symptoms: Tachycardia, Patient aware of relationship between substance abuse and physical/medical complications, Weakness, Tremors, Irritability, Fever / Chills Substance #1 Name of Substance 1: ETOH (beer & Liquor) 1 - Age of First Use: 38 years of age 68 - Amount (size/oz): A case of beer daily (highter ETOH volume) 1 - Frequency: Daily 1 - Duration: Last couple of weeks.  Had been sober for about a year. 1 - Last Use / Amount: 07/08/19 Drank a litte more than usual. Substance #2 Name of Substance 2: Crack cocaine 2 - Amount (size/oz): up to a quarter ounce 2 - Frequency: once in a week or so 2 - Duration: Had not had any for 4 weeks then had a lot on 10/02 2 - Last Use / Amount: Smoked a lot from Friday night (10/02) through this morning.  CIWA: CIWA-Ar BP: 132/86 Pulse Rate: 91 Nausea and Vomiting: no nausea and no vomiting Tactile Disturbances: none Tremor: no tremor Auditory Disturbances: not present Paroxysmal Sweats: no sweat visible Visual Disturbances: mild sensitivity Anxiety: three Headache, Fullness in Head: none present Agitation: normal activity Orientation and Clouding of Sensorium: oriented and can do serial additions CIWA-Ar Total: 5 COWS:    Allergies:  Allergies  Allergen Reactions  . Amoxicillin Hives    Home Medications: (Not in a hospital admission)   OB/GYN Status:  No LMP for male patient.  General Assessment Data Location of Assessment: [facility] ED TTS Assessment: In system Is this a Tele or Face-to-Face Assessment?: Tele  Assessment Is this an Initial Assessment or a Re-assessment for this encounter?: Initial Assessment Patient Accompanied by:: N/A Language Other than English: No Living Arrangements: Other (Comment)(Lives with wife but is probably going to have to stay w/ fri) What gender do you identify as?: Male Marital status: Married Pregnancy Status: No Living Arrangements: Spouse/significant other Can pt return to current living arrangement?: No(Patient is unsure of status.) Admission Status: Voluntary Is patient capable of signing voluntary admission?: Yes Referral Source: Self/Family/Friend(Pt drove himself to hospital.) Insurance type: self pay     Crisis Care Plan Living Arrangements: Spouse/significant other Name of Psychiatrist: Beverly Sessions Name of Therapist: None  Education Status Is patient currently in school?: No Is the patient employed, unemployed or receiving disability?: Employed  Risk  to self with the past 6 months Suicidal Ideation: Yes-Currently Present Has patient been a risk to self within the past 6 months prior to admission? : No Suicidal Intent: Yes-Currently Present Has patient had any suicidal intent within the past 6 months prior to admission? : No Is patient at risk for suicide?: Yes Suicidal Plan?: Yes-Currently Present Has patient had any suicidal plan within the past 6 months prior to admission? : No Specify Current Suicidal Plan: Get a friend's gun and shoot himself Access to Means: Yes Specify Access to Suicidal Means: Borrow a gun from someone What has been your use of drugs/alcohol within the last 12 months?: Crack cocaine, ETOH Previous Attempts/Gestures: Yes How many times?: 1 Other Self Harm Risks: SA issues Triggers for Past Attempts: Unpredictable Intentional Self Injurious Behavior: None Family Suicide History: Yes Recent stressful life event(s): Conflict (Comment)(Conflict w/ wife over his drug use) Persecutory voices/beliefs?: Yes Depression:  Yes Depression Symptoms: Despondent, Feeling worthless/self pity, Insomnia, Guilt Substance abuse history and/or treatment for substance abuse?: Yes Suicide prevention information given to non-admitted patients: Not applicable  Risk to Others within the past 6 months Homicidal Ideation: No Does patient have any lifetime risk of violence toward others beyond the six months prior to admission? : No Thoughts of Harm to Others: No Current Homicidal Intent: No Current Homicidal Plan: No Access to Homicidal Means: No Identified Victim: No one History of harm to others?: Yes Assessment of Violence: In distant past Violent Behavior Description: Two years ago. Does patient have access to weapons?: Yes (Comment)(Can get one from a friend.) Criminal Charges Pending?: Yes Describe Pending Criminal Charges: DUI  Does patient have a court date: Yes Court Date: 08/16/19 Is patient on probation?: No  Psychosis Hallucinations: None noted Delusions: None noted  Mental Status Report Appearance/Hygiene: Disheveled, Body odor, In scrubs Eye Contact: Good Motor Activity: Freedom of movement, Unremarkable Speech: Logical/coherent, Loud Level of Consciousness: Alert Mood: Anxious, Depressed, Helpless, Guilty Affect: Anxious, Depressed Anxiety Level: Moderate Thought Processes: Coherent, Relevant Judgement: Impaired Orientation: Person, Place, Situation, Time Obsessive Compulsive Thoughts/Behaviors: None  Cognitive Functioning Concentration: Decreased Memory: Remote Intact, Recent Intact Is patient IDD: No Insight: Good Impulse Control: Poor Appetite: Good Have you had any weight changes? : No Change Sleep: Decreased Total Hours of Sleep: (<4H/D for the last 3 weeks.  None in last 28 hours.) Vegetative Symptoms: None  ADLScreening [facility] Assessment Services) Patient's cognitive ability adequate to safely complete daily activities?: Yes Patient able to express need for assistance with  ADLs?: Yes Independently performs ADLs?: Yes (appropriate for developmental age)  Prior Inpatient Therapy Prior Inpatient Therapy: Yes Prior Therapy Dates: 3 years ago Prior Therapy Facilty/Provider(s): [facility] Reason for Treatment: SA  Prior Outpatient Therapy Prior Outpatient Therapy: Yes Prior Therapy Dates: Started on 07/08/19 Prior Therapy Facilty/Provider(s): [facility] Reason for Treatment: med management Does patient have an ACCT team?: No Does patient have Intensive In-House Services?  : No Does patient have [facility] services? : Yes Does patient have P4[facility] services?: No  ADL Screening (condition at time of admission) Patient's cognitive ability adequate to safely complete daily activities?: Yes Is the patient deaf or have difficulty hearing?: No Does the patient have difficulty seeing, even when wearing glasses/contacts?: No Does the patient have difficulty concentrating, remembering, or making decisions?: Yes Patient able to express need for assistance with ADLs?: Yes Does the patient have difficulty dressing or bathing?: No Independently performs ADLs?: Yes (appropriate for developmental age) Does the patient have difficulty walking or climbing stairs?:  No Weakness of Legs: None Weakness of Arms/Hands: None       Abuse/Neglect Assessment (Assessment to be complete while patient is alone) Abuse/Neglect Assessment Can Be Completed: Yes Physical Abuse: Denies Verbal Abuse: Denies Sexual Abuse: Denies Exploitation of patient/patient's resources: Denies Self-Neglect: Denies     Merchant navy officer (For Healthcare) Does Patient Have a Medical Advance Directive?: No Would patient like information on creating a medical advance directive?: No - Patient declined          Disposition:  Disposition Initial Assessment Completed for this Encounter: Yes Patient referred to: Other (Comment)(To be referred out by TTS)  This service was provided via telemedicine using a  2-way, interactive audio and video technology.  Names of all persons participating in this telemedicine service and their role in this encounter. Name: Robi Warstler Role: patient  Name: Beatriz Stallion, M.S. LCAS QP Role: clinician  Name:  Role:   Name:  Role:     Alexandria Lodge 07/10/2019 10:58 PM

## 2019-07-10 NOTE — ED Notes (Signed)
Patient wanded by security. 

## 2019-07-10 NOTE — ED Triage Notes (Signed)
Arrives C/O Chest pain following cocaine binge starting yesterday and through last night and today. Pt states that he has not eaten or drank since yesterday, also c/o headache. Has been binge drinking as well, last drink day before yesterday, denies acute withdrawal symptoms besides being "a little shaky"  Was sober for a year and then relapsed, interested in detox/recovery programs Indorses intermittent suicidal ideation without intent, plan is to step in front of a train.

## 2019-07-10 NOTE — ED Provider Notes (Signed)
[facility] EMERGENCY DEPARTMENT Provider Note   CSN: [id number] Arrival date & time: 07/10/19  1729     History   Chief Complaint Chief Complaint  Patient presents with  . Chest Pain  . Suicidal    HPI Randall Parsons is a 38 y.o. male chronic cocaine use, heart failure, hypertension, presented to emergency department left-sided chest pain.  He reports that he went on a cocaine smoking binge for the past 24 hours.  He says he smoked crack cocaine throughout the entire evening into early this morning.  He states that this morning while he was smoking began having left-sided chest pain.  It is both pressure and stabbing pain in the left side of his chest that does not radiate anywhere.  He reports he has had similar pain in the past always related to smoking crack cocaine.  He states his pain is currently 5 out of 10.  He denies any shortness of breath or cough.  He denies any lightheadedness.  He came to the ER because he is worried about his heart.  He did not take any medications prior to arrival.  Also reports difficulties with alcohol abuse.  He states he been drinking nearly 1 case of beer daily for the past month.  He said his last drink was approximately 2 days ago.  He states that he is feeling withdrawal symptoms of shakiness and anger and nerves.  He denies any history of alcohol withdrawal seizures.  He is interested in getting placed in a detox program.  Aside from these issues the patient reports that he feels very depressed today.  He reports that he lost his house his wife and potentially his car in the past 24 hours.  He says he feels depressed and thinks about suicide.  He endorses passive suicidal ideation, but denies that he has an active plan to kill himself.  He says "I am too much of a Coward to actually do anything like that".  He is interested in speaking to a therapist, but primarily interested in placement in a rehab program for drugs and alcohol.     HPI  Past Medical History:  Diagnosis Date  . CHF (congestive heart failure) (HCC)   . Exposure to hepatitis B   . Exposure to hepatitis C   . Hepatitis C   . 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, ending around 2017  . Hypertension   . Renal disorder    kidney stones    Patient Active Problem List   Diagnosis Date Noted  . Chest pain 11/28/2017  . Essential hypertension 11/28/2017  . Low back pain 11/28/2017  . NICM (nonischemic cardiomyopathy) (HCC) 11/28/2017  . Nephrolithiasis 11/28/2017    Past Surgical History:  Procedure Laterality Date  . APPENDECTOMY    . EYE SURGERY     secondary to dog bite        Home Medications    Prior to Admission medications   Medication Sig Start Date End Date Taking? Authorizing Provider  b complex vitamins tablet Take 1 tablet by mouth daily with breakfast.   Yes [provider]  CLONIDINE HCL PO Take 1 tablet by mouth at bedtime.   Yes [provider]  hydrOXYzine (VISTARIL) 25 MG capsule Take 25 mg by mouth at bedtime.   Yes [provider]  docusate sodium (COLACE) 100 MG capsule Take 1 tablet once or twice daily as needed for constipation while taking narcotic  pain medicine Patient not taking: Reported on 07/10/2019 09/13/18   Hinda Kehr, MD  ketorolac (TORADOL) 10 MG tablet Take 1 tablet (10 mg total) by mouth every 8 (eight) hours as needed for severe pain. Patient not taking: Reported on 07/10/2019 09/12/18   Nance Pear, MD  meloxicam (MOBIC) 15 MG tablet Take 15 mg by mouth daily as needed for pain.    [provider]  promethazine (PHENERGAN) 25 MG tablet Take 1 tablet (25 mg total) by mouth every 6 (six) hours as needed for nausea or vomiting. Patient not taking: Reported on 07/10/2019 09/12/18   Nance Pear, MD  tamsulosin (FLOMAX) 0.4 MG CAPS capsule Take 1 capsule (0.4 mg total) by mouth daily. Patient not taking: Reported on 07/10/2019 09/12/18   Nance Pear, MD    Family History Family History  Problem Relation Age of Onset  . Hypertension Father     Social History Social History   Tobacco Use  . Smoking status: Former Research scientist (life sciences)  . Smokeless tobacco: Never Used  Substance Use Topics  . Alcohol use: No  . Drug use: Not Currently    Types: IV    Comment: hx of heroin abuse (opioid addiction)     Allergies   Amoxicillin   Review of Systems Review of Systems  Constitutional: Negative for chills and fever.  Eyes: Negative for photophobia and visual disturbance.  Respiratory: Positive for chest tightness and shortness of breath. Negative for cough.   Cardiovascular: Positive for chest pain and palpitations.  Gastrointestinal: Positive for nausea. Negative for abdominal pain and vomiting.  Musculoskeletal: Positive for arthralgias and myalgias.  Skin: Negative for pallor and rash.  Neurological: Positive for light-headedness and headaches. Negative for seizures, syncope and speech difficulty.  Psychiatric/Behavioral: Positive for agitation and suicidal ideas. Negative for confusion, self-injury and sleep disturbance. The patient is nervous/anxious and is hyperactive.   All other systems reviewed and are negative.    Physical Exam Updated Vital Signs BP 118/85   Pulse 85   Resp 13   SpO2 97%   Physical Exam Vitals signs and nursing note reviewed.  Constitutional:      Appearance: He is well-developed.  HENT:     Head: Normocephalic and atraumatic.  Eyes:     Conjunctiva/sclera: Conjunctivae normal.  Neck:     Musculoskeletal: Neck supple.  Cardiovascular:     Rate and Rhythm: Normal rate and regular rhythm.  Pulmonary:     Effort: Pulmonary effort is normal. No respiratory distress.     Breath sounds: Normal breath sounds.  Abdominal:     Palpations: Abdomen is soft.     Tenderness: There is no abdominal tenderness.  Skin:    General: Skin is warm and dry.  Neurological:     General: No focal deficit  present.     Mental Status: [name] to person, place, and time.  Psychiatric:        Mood and Affect: Mood normal.        Behavior: Behavior normal. Behavior is not agitated.      ED Treatments / Results  Labs (all labs ordered are listed, but only abnormal results are displayed) Labs Reviewed  HEPATIC FUNCTION PANEL - Abnormal; Notable for the following components:      Result Value   ALT 50 (*)    All other components within normal limits  ACETAMINOPHEN LEVEL - Abnormal; Notable for the following components:   Acetaminophen (Tylenol), Serum <10 (*)  All other components within normal limits  SARS CORONAVIRUS 2 (TAT 6-24 HRS)  CBC WITH DIFFERENTIAL/PLATELET  BASIC METABOLIC PANEL  ETHANOL  BRAIN NATRIURETIC PEPTIDE  SALICYLATE LEVEL  RAPID URINE DRUG SCREEN, HOSP PERFORMED  TROPONIN I (HIGH SENSITIVITY)  TROPONIN I (HIGH SENSITIVITY)    EKG EKG Interpretation  Date/Time:  Saturday July 10 2019 17:42:56 EDT Ventricular Rate:  92 PR Interval:    QRS Duration: 89 QT Interval:  356 QTC Calculation: 441 R Axis:   97 Text Interpretation:  Sinus rhythm Borderline right axis deviation no STEMI  Confirmed by Alvester Chourifan, Lylla Eifler [phone]([id number]) on 07/10/2019 7:42:59 PM   Radiology Dg Chest Portable 1 View  Result Date: 07/10/2019 CLINICAL DATA:  Chest pain, cocaine and alcohol binge EXAM: PORTABLE CHEST 1 VIEW COMPARISON:  04/16/2019 FINDINGS: The heart size and mediastinal contours are within normal limits. Both lungs are clear. The visualized skeletal structures are unremarkable. IMPRESSION: No acute abnormality of the lungs in AP portable projection. Electronically Signed   By: Lauralyn PrimesAlex  Bibbey M.D.   On: 07/10/2019 18:25    Procedures Procedures (including critical care time)  Medications Ordered in ED Medications  acetaminophen (TYLENOL) tablet 650 mg (has no administration in time range)  ondansetron (ZOFRAN) tablet 4 mg (has no administration in time range)   LORazepam (ATIVAN) injection 1 mg (has no administration in time range)  sodium chloride 0.9 % bolus 1,000 mL (0 mLs Intravenous Stopped 07/10/19 2014)  aspirin chewable tablet 324 mg (324 mg Oral Given 07/10/19 1919)  nitroGLYCERIN (NITROSTAT) SL tablet 0.4 mg (0.4 mg Sublingual Given 07/10/19 1919)  LORazepam (ATIVAN) injection 1 mg (1 mg Intravenous Given 07/10/19 2014)     Initial Impression / Assessment and Plan / ED Course  I have reviewed the triage vital signs and the nursing notes.  Pertinent labs & imaging results that were available during my care of the patient were reviewed by me and considered in my medical decision making (see chart for details).  Patient is a 38 year old male presenting to the emergency department with cocaine induced chest pain, reports smoking crack cocaine throughout the evening, developing chest pain at this morning.  He is currently having 5 out of 10 chest pain.  Reports he has had a history of heart attacks in the past.  He says these were induced by his cocaine use.  His EKG does not show ST elevations concerning for acute ischemia.  We will try sublingual nitroglycerin, that does not work we will try IV Ativan for his pain.  Also obtain a chest pain work-up including troponins and a chest x-ray.  He has no infectious symptoms concerning for COVID-19 at this time.  We will simply screen him for this given his need for psych evaluation.  He has no evidence of acute alcohol withdrawal my exam.  He has a low CIWA score.  He appears quite comfortable in the room.  If his medical work-up is negative, will place in behavioral health hold and [facility].   He reports some passive suicidal ideations and reports a lot of recent stressors as a cause for his depression.  He is willing to stay for evaluation.  He has no active plan to harm himself.  He demonstrates insight into his decisions and is fully oriented to my exam. I  believe that if he wishes to leave and pursue outpatient therapy for his depression, he should be free to do so.  I do not  believe he requires involuntary commitment at this time.  Clinical Course as of Jul 10 2123  Sat Jul 10, 2019  1955 Chest pain unchanged with sublingual nitroglycerin.  We will try 1 mg of Ativan for cocaine induced chest pain.   [MT]  2123 With a negative troponin multiple hours after onset of his pain, very low suspicion for coronary artery disease at this time.  Patient reports his Ativan is helped his chest pain.  He is medically cleared at this point will transfer to behavioral health hold to be seen by the psychiatry team.  The patient is willing to stay voluntarily.  At this time I do not believe he needs involuntary commitment.     [MT]    Clinical Course User Index [MT] Renaye Rakers Kermit Balo, MD     Final Clinical Impressions(s) / ED Diagnoses   Final diagnoses:  Suicidal ideation  Chest pain, unspecified type  Cocaine use disorder [facility])  Alcohol abuse    ED Discharge Orders    None       Terald Sleeper, MD 07/10/19 2124

## 2019-07-10 NOTE — ED Notes (Signed)
Patient using the TTS machine.

## 2019-07-10 NOTE — ED Triage Notes (Signed)
Formatting of this note might be different from the original.  Arrives C/O Chest pain following cocaine binge starting yesterday and through last night and today. Pt states that he has not eaten or drank since yesterday, also c/o headache. Has been binge drinking as well, last drink day before yesterday, denies acute withdrawal symptoms besides being "a little shaky"   Was sober for a year and then relapsed, interested in detox/recovery programs  Indorses intermittent suicidal ideation without intent, plan is to step in front of a train.   Electronically signed by Ciro Backer, RN at 07/10/2019  5:46 PM EDT

## 2019-07-10 NOTE — ED Provider Notes (Signed)
Formatting of this note is different from the original.    [facility] EMERGENCY DEPARTMENT  Provider Note    CSN: [id number]  Arrival date & time: 07/10/19  1729        History    Chief Complaint  Chief Complaint   Patient presents with   ? Chest Pain   ? Suicidal     HPI  Lucas Hicks is a 38 y.o. male chronic cocaine use, heart failure, hypertension, presented to emergency department left-sided chest pain.  He reports that he went on a cocaine smoking binge for the past 24 hours.  He says he smoked crack cocaine throughout the entire evening into early this morning.  He states that this morning while he was smoking began having left-sided chest pain.  It is both pressure and stabbing pain in the left side of his chest that does not radiate anywhere.  He reports he has had similar pain in the past always related to smoking crack cocaine.  He states his pain is currently 5 out of 10.  He denies any shortness of breath or cough.  He denies any lightheadedness.  He came to the ER because he is worried about his heart.  He did not take any medications prior to arrival.    Also reports difficulties with alcohol abuse.  He states he been drinking nearly 1 case of beer daily for the past month.  He said his last drink was approximately 2 days ago.  He states that he is feeling withdrawal symptoms of shakiness and anger and nerves.  He denies any history of alcohol withdrawal seizures.  He is interested in getting placed in a detox program.    Aside from these issues the patient reports that he feels very depressed today.  He reports that he lost his house his wife and potentially his car in the past 24 hours.  He says he feels depressed and thinks about suicide.  He endorses passive suicidal ideation, but denies that he has an active plan to kill himself.  He says "I am too much of a Coward to actually do anything like that".  He is interested in speaking to a therapist, but primarily interested in  placement in a rehab program for drugs and alcohol.      HPI    Past Medical History:   Diagnosis Date   ? CHF (congestive heart failure) (HCC)    ? Exposure to hepatitis B    ? Exposure to hepatitis C    ? Hepatitis C    ? History of opioid abuse (HCC)     reports heroin abuse, ending around 2017   ? Hypertension    ? Renal disorder     kidney stones     Patient Active Problem List    Diagnosis Date Noted   ? Chest pain 11/28/2017   ? Essential hypertension 11/28/2017   ? Low back pain 11/28/2017   ? NICM (nonischemic cardiomyopathy) (HCC) 11/28/2017   ? Nephrolithiasis 11/28/2017     Past Surgical History:   Procedure Laterality Date   ? APPENDECTOMY     ? EYE SURGERY      secondary to dog bite       Home Medications      Prior to Admission medications    Medication Sig Start Date End Date Taking? Authorizing Provider   b complex vitamins tablet Take 1 tablet by mouth daily with breakfast.   Yes [provider]   CLONIDINE HCL PO Take 1 tablet by mouth at bedtime.   Yes [provider]   hydrOXYzine (VISTARIL) 25 MG capsule Take 25 mg by mouth at bedtime.   Yes [provider]   docusate sodium (COLACE) 100 MG capsule Take 1 tablet once or twice daily as needed for constipation while taking narcotic pain medicine  Patient not taking: Reported on 07/10/2019 09/13/18   Loleta Rose, MD   ketorolac (TORADOL) 10 MG tablet Take 1 tablet (10 mg total) by mouth every 8 (eight) hours as needed for severe pain.  Patient not taking: Reported on 07/10/2019 09/12/18   Phineas Semen, MD   meloxicam (MOBIC) 15 MG tablet Take 15 mg by mouth daily as needed for pain.    [provider]   promethazine (PHENERGAN) 25 MG tablet Take 1 tablet (25 mg total) by mouth every 6 (six) hours as needed for nausea or vomiting.  Patient not taking: Reported on 07/10/2019 09/12/18   Phineas Semen, MD   tamsulosin (FLOMAX) 0.4 MG CAPS capsule Take 1 capsule (0.4 mg total) by mouth daily.  Patient not taking:  Reported on 07/10/2019 09/12/18   Phineas Semen, MD     Family History  Family History   Problem Relation Age of Onset   ? Hypertension Father      Social History  Social History     Tobacco Use   ? Smoking status: Former Smoker   ? Smokeless tobacco: Never Used   Substance Use Topics   ? Alcohol use: No   ? Drug use: Not Currently     Types: IV     Comment: hx of heroin abuse (opioid addiction)     Allergies    Amoxicillin    Review of Systems  Review of Systems   Constitutional: Negative for chills and fever.   Eyes: Negative for photophobia and visual disturbance.   Respiratory: Positive for chest tightness and shortness of breath. Negative for cough.    Cardiovascular: Positive for chest pain and palpitations.   Gastrointestinal: Positive for nausea. Negative for abdominal pain and vomiting.   Musculoskeletal: Positive for arthralgias and myalgias.   Skin: Negative for pallor and rash.   Neurological: Positive for light-headedness and headaches. Negative for seizures, syncope and speech difficulty.   Psychiatric/Behavioral: Positive for agitation and suicidal ideas. Negative for confusion, self-injury and sleep disturbance. The patient is nervous/anxious and is hyperactive.    All other systems reviewed and are negative.    Physical Exam  Updated Vital Signs  BP 118/85   Pulse 85   Resp 13   SpO2 97%     Physical Exam  Vitals signs and nursing note reviewed.   Constitutional:       Appearance: He is well-developed.   HENT:      Head: Normocephalic and atraumatic.   Eyes:      Conjunctiva/sclera: Conjunctivae normal.   Neck:      Musculoskeletal: Neck supple.   Cardiovascular:      Rate and Rhythm: Normal rate and regular rhythm.   Pulmonary:      Effort: Pulmonary effort is normal. No respiratory distress.      Breath sounds: Normal breath sounds.   Abdominal:      Palpations: Abdomen is soft.      Tenderness: There is no abdominal tenderness.   Skin:     General: Skin is warm and dry.   Neurological:       General: No  focal deficit present.      Mental Status: [name] to person, place, and time.   Psychiatric:         Mood and Affect: Mood normal.         Behavior: Behavior normal. Behavior is not agitated.     ED Treatments / Results   Labs  (all labs ordered are listed, but only abnormal results are displayed)  Labs Reviewed   HEPATIC FUNCTION PANEL - Abnormal; Notable for the following components:       Result Value    ALT 50 (*)     All other components within normal limits   ACETAMINOPHEN LEVEL - Abnormal; Notable for the following components:    Acetaminophen (Tylenol), Serum <10 (*)     All other components within normal limits   SARS CORONAVIRUS 2 (TAT 6-24 HRS)   CBC WITH DIFFERENTIAL/PLATELET   BASIC METABOLIC PANEL   ETHANOL   BRAIN NATRIURETIC PEPTIDE   SALICYLATE LEVEL   RAPID URINE DRUG SCREEN, HOSP PERFORMED   TROPONIN I (HIGH SENSITIVITY)   TROPONIN I (HIGH SENSITIVITY)     EKG  EKG Interpretation    Date/Time:  Saturday July 10 2019 17:42:56 EDT  Ventricular Rate:  92  PR Interval:     QRS Duration: 89  QT Interval:  356  QTC Calculation: 441  R Axis:   97  Text Interpretation:  Sinus rhythm Borderline right axis deviation no STEMI  Confirmed by Alvester Chou [phone]) on 07/10/2019 7:42:59 PM    Radiology  Dg Chest Portable 1 View    Result Date: 07/10/2019  CLINICAL DATA:  Chest pain, cocaine and alcohol binge EXAM: PORTABLE CHEST 1 VIEW COMPARISON:  04/16/2019 FINDINGS: The heart size and mediastinal contours are within normal limits. Both lungs are clear. The visualized skeletal structures are unremarkable. IMPRESSION: No acute abnormality of the lungs in AP portable projection. Electronically Signed   By: Lauralyn Primes M.D.   On: 07/10/2019 18:25     Procedures  Procedures (including critical care time)    Medications Ordered in ED  Medications   acetaminophen (TYLENOL) tablet 650 mg (has no administration in time range)   ondansetron (ZOFRAN) tablet 4 mg (has no administration  in time range)   LORazepam (ATIVAN) injection 1 mg (has no administration in time range)   sodium chloride 0.9 % bolus 1,000 mL (0 mLs Intravenous Stopped 07/10/19 2014)   aspirin chewable tablet 324 mg (324 mg Oral Given 07/10/19 1919)   nitroGLYCERIN (NITROSTAT) SL tablet 0.4 mg (0.4 mg Sublingual Given 07/10/19 1919)   LORazepam (ATIVAN) injection 1 mg (1 mg Intravenous Given 07/10/19 2014)     Initial Impression / Assessment and Plan / ED Course   I have reviewed the triage vital signs and the nursing notes.    Pertinent labs & imaging results that were available during my care of the patient were reviewed by me and considered in my medical decision making (see chart for details).    Patient is a 38 year old male presenting to the emergency department with cocaine induced chest pain, reports smoking crack cocaine throughout the evening, developing chest pain at this morning.  He is currently having 5 out of 10 chest pain.  Reports he has had a history of heart attacks in the past.  He says these were induced by his cocaine use.    His EKG does not show ST elevations concerning for acute ischemia.  We will try sublingual  nitroglycerin, that does not work we will try IV Ativan for his pain.    Also obtain a chest pain work-up including troponins and a chest x-ray.    He has no infectious symptoms concerning for COVID-19 at this time.  We will simply screen him for this given his need for psych evaluation.    He has no evidence of acute alcohol withdrawal my exam.  He has a low CIWA score.  He appears quite comfortable in the room.    If his medical work-up is negative, will place in behavioral health hold and have him seen by [facility] tonight or tomorrow morning.   He reports some passive suicidal ideations and reports a lot of recent stressors as a cause for his depression.  He is willing to stay for evaluation.  He has no active plan to harm himself.  He demonstrates insight into his decisions and is fully oriented to my  exam. I believe that if he wishes to leave and pursue outpatient therapy for his depression, he should be free to do so.  I do not believe he requires involuntary commitment at this time.    Clinical Course as of Jul 10 2123   Sat Jul 10, 2019   1955 Chest pain unchanged with sublingual nitroglycerin.  We will try 1 mg of Ativan for cocaine induced chest pain.    [MT]   2123 With a negative troponin multiple hours after onset of his pain, very low suspicion for coronary artery disease at this time.  Patient reports his Ativan is helped his chest pain.  He is medically cleared at this point will transfer to behavioral health hold to be seen by the psychiatry team.  The patient is willing to stay voluntarily.  At this time I do not believe he needs involuntary commitment.      [MT]     Clinical Course User Index  [MT] Trifan, Kermit Balo, MD     Final Clinical Impressions(s) / ED Diagnoses     Final diagnoses:   Suicidal ideation   Chest pain, unspecified type   Cocaine use disorder [facility])   Alcohol abuse     ED Discharge Orders     None         Terald Sleeper, MD  07/10/19 2124    Electronically signed by Terald Sleeper, MD at 07/10/2019  9:24 PM EDT

## 2019-07-10 NOTE — ED Notes (Signed)
Formatting of this note might be different from the original.  Patient wanded by security.   Electronically signed by Rosalene Billings, RN at 07/10/2019 11:15 PM EDT

## 2019-07-10 NOTE — ED Provider Notes (Signed)
Formatting of this note might be different from the original.  Patient meets inpatient criteria per [facility] TTS.    Glynn Octave, MD  07/10/19 2335    Electronically signed by Glynn Octave, MD at 07/10/2019 11:35 PM EDT

## 2019-07-10 NOTE — ED Notes (Signed)
Formatting of this note might be different from the original.  Patient using the TTS machine.   Electronically signed by Rosalene Billings, RN at 07/10/2019  9:57 PM EDT

## 2019-07-10 NOTE — Unmapped (Signed)
Formatting of this note is different from the original.  Tele Assessment Note    Patient Name: Lucas Hicks  MRN: [id number]  Referring Physician: Alvester Chou, MD  Location of Patient: [facility]  Location of Provider: Behavioral Health TTS Department    Lucas Hicks is an 38 y.o. male.   -Clinician reviewed note by Dr. Renaye Rakers.  Aside from these issues the patient reports that he feels very depressed today.  He reports that he lost his house his wife and potentially his car in the past 24 hours.  He says he feels depressed and thinks about suicide.  He endorses passive suicidal ideation, but denies that he has an active plan to kill himself. Also reports difficulties with alcohol abuse.  He states he been drinking nearly 1 case of beer daily for the past month.  He said his last drink was approximately 2 days ago.  He states that he is feeling withdrawal symptoms of shakiness and anger and nerves.  He denies any history of alcohol withdrawal seizures.  He is interested in getting placed in a detox program.  He reports that he went on a cocaine smoking binge for the past 24 hours.  He says he smoked crack cocaine throughout the entire evening into early this morning.      Patient says that he had been sober for several months.  He had been sober for a year up to last November (2019).  He said, "I have slipped up here and there and used crack & ETOH."  He reports using crack cocaine from Friday night (07/09/19) through this morning.  He said he had gotten paid in cash and it was too tempting.  He has been also drinking up to a case of beer daily and liquor too.  His last drink was Thursday night (10/01) however.  He said he usually has sweats, tremors, irritability when he is withdrawing.  Patient says that he has not had any seizures before.  Patient also said that he has not slept in over 28 hours.  In the last 3 weeks he has been getting little sleep, racing thoughts at night.    Patient said that his wife is telling  him she is divorcing him.  He laments the fact that he is about to lose his wife, vehicle and home because of his addiction.  Patient says he is depressed enough to try to kill himself.  He does have a plan to shoot himself in the head with a gun he can get from a friend.  Pt does not feel safe at this time.    Pt eye contact is good.  He is somewhat restless.  He reports a lot of anxiety.  Affect is congruent with stated mood.  Pt is oriented x4.  Speech is rapid and loud but not agitated.  Patient thought process is logical and coherent.    Patient says that he has had trouble sleeping and went to [facility] on 07/08/19 to get medication.  He had not been to [facility] in over a year.  Patient have been inpatient with several substance abuse programs. His most recent program was with [facility]. He was with them two times. He also have been with [facility] (1997), [facility] (2x-2019), [facility] [phone]), [facility] (1996), [facility] (4x).     -Clinician discussed patient care with Nira Conn, FNP who recommends inpatient psychiatric care.  Patient is interested in going to [facility] house when he  is discharged from psychiatric facility.  AC Randa Evens said there were no appropriate beds at [facility] at this time.  TTS to refer patient out.  Dr. Manus Gunning notified of disposition.    Diagnosis: MDD single episode severe; ETOH use d/o severe; Cocaine use d/o severe    Past Medical History:   Past Medical History:   Diagnosis Date   ? CHF (congestive heart failure) (HCC)    ? Exposure to hepatitis B    ? Exposure to hepatitis C    ? Hepatitis C    ? History of opioid abuse (HCC)     reports heroin abuse, ending around 2017   ? Hypertension    ? Renal disorder     kidney stones     Past Surgical History:   Procedure Laterality Date   ? APPENDECTOMY     ? EYE SURGERY      secondary to dog bite     Family History:   Family History   Problem Relation Age of Onset   ? Hypertension Father      Social History:   reports that he has quit smoking. He has never used smokeless tobacco. He reports previous drug use. Drug: IV. He reports that he does not drink alcohol.    Additional Social History:  Alcohol / Drug Use  Pain Medications: None  Prescriptions: Vistaril and clonidine (took them the night before last)  Over the Counter: Tylenol & multi vitamin  History of alcohol / drug use?: Yes  Longest period of sobriety (when/how long): Celebrated a year of sobriety in October 2019.  Withdrawal Symptoms: Tachycardia, Patient aware of relationship between substance abuse and physical/medical complications, Weakness, Tremors, Irritability, Fever / Chills  Substance #1  Name of Substance 1: ETOH (beer & Liquor)  1 - Age of First Use: 38 years of age  21 - Amount (size/oz): A case of beer daily (highter ETOH volume)  1 - Frequency: Daily  1 - Duration: Last couple of weeks.  Had been sober for about a year.  1 - Last Use / Amount: 07/08/19 Drank a litte more than usual.  Substance #2  Name of Substance 2: Crack cocaine  2 - Amount (size/oz): up to a quarter ounce  2 - Frequency: once in a week or so  2 - Duration: Had not had any for 4 weeks then had a lot on 10/02  2 - Last Use / Amount: Smoked a lot from Friday night (10/02) through this morning.    CIWA: CIWA-Ar  BP: 132/86  Pulse Rate: 91  Nausea and Vomiting: no nausea and no vomiting  Tactile Disturbances: none  Tremor: no tremor  Auditory Disturbances: not present  Paroxysmal Sweats: no sweat visible  Visual Disturbances: mild sensitivity  Anxiety: three  Headache, Fullness in Head: none present  Agitation: normal activity  Orientation and Clouding of Sensorium: oriented and can do serial additions  CIWA-Ar Total: 5  COWS:      Allergies:   Allergies   Allergen Reactions   ? Amoxicillin Hives     Home Medications: (Not in a hospital admission)    OB/GYN Status:  No LMP for male patient.    General Assessment Data  Location of Assessment: [facility]  TTS Assessment: In system  Is this  a Tele or Face-to-Face Assessment?: Tele Assessment  Is this an Initial Assessment or a Re-assessment for this encounter?: Initial Assessment  Patient Accompanied by:: N/A  Language Other than English: No  Living Arrangements: Other (Comment)(Lives with wife but is probably going to have to stay w/ fri)  What gender do you identify as?: Male  Marital status: Married  Pregnancy Status: No  Living Arrangements: Spouse/significant other  Can pt return to current living arrangement?: No(Patient is unsure of status.)  Admission Status: Voluntary  Is patient capable of signing voluntary admission?: Yes  Referral Source: Self/Family/Friend(Pt drove himself to hospital.)  Insurance type: self pay        Crisis Care Plan  Living Arrangements: Spouse/significant other  Name of Psychiatrist: Vesta Mixer  Name of Therapist: None    Education Status  Is patient currently in school?: No  Is the patient employed, unemployed or receiving disability?: Employed    Risk to self with the past 6 months  Suicidal Ideation: Yes-Currently Present  Has patient been a risk to self within the past 6 months prior to admission? : No  Suicidal Intent: Yes-Currently Present  Has patient had any suicidal intent within the past 6 months prior to admission? : No  Is patient at risk for suicide?: Yes  Suicidal Plan?: Yes-Currently Present  Has patient had any suicidal plan within the past 6 months prior to admission? : No  Specify Current Suicidal Plan: Get a friend's gun and shoot himself  Access to Means: Yes  Specify Access to Suicidal Means: Borrow a gun from someone  What has been your use of drugs/alcohol within the last 12 months?: Crack cocaine, ETOH  Previous Attempts/Gestures: Yes  How many times?: 1  Other Self Harm Risks: SA issues  Triggers for Past Attempts: Unpredictable  Intentional Self Injurious Behavior: None  Family Suicide History: Yes  Recent stressful life event(s): Conflict (Comment)(Conflict w/ wife over his drug  use)  Persecutory voices/beliefs?: Yes  Depression: Yes  Depression Symptoms: Despondent, Feeling worthless/self pity, Insomnia, Guilt  Substance abuse history and/or treatment for substance abuse?: Yes  Suicide prevention information given to non-admitted patients: Not applicable    Risk to Others within the past 6 months  Homicidal Ideation: No  Does patient have any lifetime risk of violence toward others beyond the six months prior to admission? : No  Thoughts of Harm to Others: No  Current Homicidal Intent: No  Current Homicidal Plan: No  Access to Homicidal Means: No  Identified Victim: No one  History of harm to others?: Yes  Assessment of Violence: In distant past  Violent Behavior Description: Two years ago.  Does patient have access to weapons?: Yes (Comment)(Can get one from a friend.)  Criminal Charges Pending?: Yes  Describe Pending Criminal Charges: DUI   Does patient have a court date: Yes  Court Date: 08/16/19  Is patient on probation?: No    Psychosis  Hallucinations: None noted  Delusions: None noted    Mental Status Report  Appearance/Hygiene: Disheveled, Body odor, In scrubs  Eye Contact: Good  Motor Activity: Freedom of movement, Unremarkable  Speech: Logical/coherent, Loud  Level of Consciousness: Alert  Mood: Anxious, Depressed, Helpless, Guilty  Affect: Anxious, Depressed  Anxiety Level: Moderate  Thought Processes: Coherent, Relevant  Judgement: Impaired  Orientation: Person, Place, Situation, Time  Obsessive Compulsive Thoughts/Behaviors: None    Cognitive Functioning  Concentration: Decreased  Memory: Remote Intact, Recent Intact  Is patient IDD: No  Insight: Good  Impulse Control: Poor  Appetite: Good  Have you had any weight changes? : No Change  Sleep: Decreased  Total Hours of Sleep: (<4H/D for the last 3 weeks.  None in last 28  hours.)  Vegetative Symptoms: None    ADLScreening [facility])  Patient's cognitive ability adequate to safely complete daily activities?:  Yes  Patient able to express need for assistance with ADLs?: Yes  Independently performs ADLs?: Yes (appropriate for developmental age)    Prior Inpatient Therapy  Prior Inpatient Therapy: Yes  Prior Therapy Dates: 3 years ago  Prior Therapy Facilty/Provider(s): [facility]  Reason for Treatment: SA    Prior Outpatient Therapy  Prior Outpatient Therapy: Yes  Prior Therapy Dates: Started on 07/08/19  Prior Therapy Facilty/Provider(s): [facility]  Reason for Treatment: med management  Does patient have an ACCT team?: No  Does patient have Intensive In-House Services?  : No  Does patient have [facility] services? : Yes  Does patient have P4CC services?: No    ADL Screening (condition at time of admission)  Patient's cognitive ability adequate to safely complete daily activities?: Yes  Is the patient deaf or have difficulty hearing?: No  Does the patient have difficulty seeing, even when wearing glasses/contacts?: No  Does the patient have difficulty concentrating, remembering, or making decisions?: Yes  Patient able to express need for assistance with ADLs?: Yes  Does the patient have difficulty dressing or bathing?: No  Independently performs ADLs?: Yes (appropriate for developmental age)  Does the patient have difficulty walking or climbing stairs?: No  Weakness of Legs: None  Weakness of Arms/Hands: None          Abuse/Neglect Assessment (Assessment to be complete while patient is alone)  Abuse/Neglect Assessment Can Be Completed: Yes  Physical Abuse: Denies  Verbal Abuse: Denies  Sexual Abuse: Denies  Exploitation of patient/patient's resources: Denies  Self-Neglect: Denies      Merchant navy officer (For Healthcare)  Does Patient Have a Medical Advance Directive?: No  Would patient like information on creating a medical advance directive?: No - Patient declined              Disposition:   Disposition  Initial Assessment Completed for this Encounter: Yes  Patient referred to: Other (Comment)(To be referred out by TTS)    This  service was provided via telemedicine using a 2-way, interactive audio and video technology.    Names of all persons participating in this telemedicine service and their role in this encounter.  Name: Zisha Montjoy Role: patient   Name: Beatriz Stallion, M.S. LCAS QP Role: clinician   Name:  Role:    Name:  Role:      Alexandria Lodge  07/10/2019 10:58 PM  Electronically signed by Bubba Camp, LCAS at 07/10/2019 11:37 PM EDT

## 2019-07-11 LAB — SARS CORONAVIRUS 2 (TAT 6-24 HRS): SARS Coronavirus 2: NEGATIVE

## 2019-07-11 MED ORDER — VITAMIN B-1 100 MG PO TABS
100.0000 mg | ORAL_TABLET | Freq: Every day | ORAL | Status: DC
  Administered 2019-07-11 – 2019-07-12 (×2): 100 mg via ORAL
  Filled 2019-07-11 (×2): qty 1

## 2019-07-11 MED ORDER — THIAMINE HCL 100 MG/ML IJ SOLN: 100.0000 mg | Freq: Every day | INTRAMUSCULAR | Status: DC

## 2019-07-11 NOTE — ED Notes (Signed)
Pt noted to be lying on bed watching tv. No tremors noted. Pt called RN to room asking if he may have "some more of that medicine" and pt was shaking his hands in what appeared to be purposeful movements. Advised pt it is not time for med. After RN left room, observed pt return to watching tv and no tremors noted.

## 2019-07-11 NOTE — ED Provider Notes (Signed)
Emergency department observation rounding note  38 year old male has been in this department for over 15 hours at this point.  Originally presented for chest pain and suicidal ideations.  He was medically cleared by previous team, meets inpatient criteria per psychiatry.  COVID negative Initial and delta high-sensitivity troponin: 5 UDS positive for cocaine LFTs nonacute BMP within normal limits BNP within normal limits Salicylate negative Tylenol negative Ethanol negative CBC within normal limits CXR:    IMPRESSION:  No acute abnormality of the lungs in AP portable projection.  EKG reviewed by Dr. Renaye Rakers without acute findings  Temperature 97.5 F, pulse of 73, BP 108/82, respiratory rate 18, SPO2 100% on room air. Physical Exam  BP 108/82   Pulse 73   Temp (!) 97.5 F (36.4 C) (Oral)   Resp 18   Ht 5\' 9"  (1.753 m)   Wt 90.3 kg   SpO2 100%   BMI 29.39 kg/m   Physical Exam Constitutional:      General: He is not in acute distress.    Appearance: Normal appearance. He is well-developed. He is not ill-appearing or diaphoretic.  HENT:     Head: Normocephalic and atraumatic.     Right Ear: External ear normal.     Left Ear: External ear normal.     Nose: Nose normal.  Eyes:     General: Vision grossly intact. Gaze aligned appropriately.  Neck:     Musculoskeletal: Normal range of motion.     Trachea: Trachea and phonation normal. No tracheal deviation.  Pulmonary:     Effort: Pulmonary effort is normal. No respiratory distress.  Abdominal:     General: There is no distension.  Musculoskeletal: Normal range of motion.  Skin:    General: Skin is warm and dry.  Neurological:     Mental Status: He is alert.     GCS: GCS eye subscore is 4. GCS verbal subscore is 5. GCS motor subscore is 6.     Comments: Speech is clear and goal oriented, follows commands Major Cranial nerves without deficit, no facial droop Moves extremities without ataxia, coordination intact   Psychiatric:        Behavior: Behavior normal.     ED Course/Procedures   Clinical Course as of Jul 10 925  Sat Jul 10, 2019  1955 Chest pain unchanged with sublingual nitroglycerin.  We will try 1 mg of Ativan for cocaine induced chest pain.   [MT]  2123 With a negative troponin multiple hours after onset of his pain, very low suspicion for coronary artery disease at this time.  Patient reports his Ativan is helped his chest pain.  He is medically cleared at this point will transfer to behavioral health hold to be seen by the psychiatry team.  The patient is willing to stay voluntarily.  At this time I do not believe he needs involuntary commitment.     [MT]    Clinical Course User Index [MT] Trifan, 2124, MD    Procedures  MDM  Per RN Kermit Balo no events to report.  She asked that patient be placed on CIWA protocol in case of withdrawal, no signs of withdrawal at this time.  CIWA ordered.  Patient assessed, he is sleeping in bed comfortably no acute distress.  He is arousable to voice.  He reports that he just finished breakfast and that he enjoyed it.  He has no complaints or concerns at this time and feels well.  Patient remains medically cleared and is  awaiting placement.   Note: Portions of this report may have been transcribed using voice recognition software. Every effort was made to ensure accuracy; however, inadvertent computerized transcription errors may still be present.   Deliah Boston, PA-C 07/11/19 0930    Lucrezia Starch, MD 07/12/19 [phone]

## 2019-07-11 NOTE — ED Notes (Signed)
Pt awake - put on paper scrubs as pt states he likes to sleep naked. Pt ambulated to bathroom w/o difficulty.

## 2019-07-11 NOTE — ED Notes (Signed)
Pt noted to be shaking right hand when handed meal try to RN w/his left hand - left hand and tray noted to be stable.

## 2019-07-11 NOTE — ED Notes (Addendum)
States SI plan is to go to his friend's house and shoot himself w/friend's gun. When asked if he is homeless - advised "No, I can go stay on my friend's couches". States feels needs Inpt d/t feeling hopeless and "I have lost everything in the past 24 hours". States has hx SI attempt when he was a teenager - cut left anterior wrist. States "I have been fighting addiction all of my life".States feels safe in ED but voices he may hurt himself if d/c'd. Pt noted to be calm, cooperative.

## 2019-07-11 NOTE — ED Notes (Signed)
Pt in shower.  

## 2019-07-11 NOTE — BHH Counselor (Signed)
Pt was reassessed this morning.  Pt reported today that he felt increasingly suicidal and endorsed a plan to shoot himself.  Pt said he has access to a gun.  When asked if he felt safe, Pt said that he came to the hospital because he does not feel safe to be outside of the hospital.  He recommend continued inpatient placement.  38 y.o. male.  -Clinician reviewed note by Dr. Langston Masker.  Aside from these issues the patient reports that he feels very depressed today. He reports that he lost his house his wife and potentially his car in the past 24 hours. He says he feels depressed and thinks about suicide. He endorses passive suicidal ideation, but denies that he has an active plan to kill himself. Also reports difficulties with alcohol abuse. He states he been drinking nearly 1 case of beer daily for the past month. He said his last drink was approximately 2 days ago. He states that he is feeling withdrawal symptoms of shakiness and anger and nerves. He denies any history of alcohol withdrawal seizures. He is interested in getting placed in a detox program.  He reports that he went on a cocaine smoking binge for the past 24 hours. He says he smoked crack cocaine throughout the entire evening into early this morning.

## 2019-07-11 NOTE — ED Notes (Signed)
Ordered a hospital bed--Randall Parsons  

## 2019-07-11 NOTE — Progress Notes (Signed)
Patient meets criteria for inpatient treatment. No appropriate beds available at [facility] currently per [facility]. CSW faxed referrals to the following facilities for review:  [place], [place], [place], [place], [place], [place], [place], [place], [place], [place], [place], [place].  TTS will continue to seek bed placement.   Maxie Better, MSW, LCSW Clinical Social Worker 07/11/2019 1:33 PM

## 2019-07-11 NOTE — ED Notes (Signed)
Pt eating. Pt's arms noted to be w/tremors. Pt states he feels anxious. Ativan given as ordered.

## 2019-07-11 NOTE — ED Notes (Signed)
Ordered breakfast--Randall Parsons 

## 2019-07-11 NOTE — ED Notes (Signed)
Lunch tray ordered 

## 2019-07-11 NOTE — ED Notes (Signed)
Formatting of this note might be different from the original.  Pt eating. Pt's arms noted to be w/tremors. Pt states he feels anxious. Ativan given as ordered.   Electronically signed by Dub Mikes, RN at 07/11/2019  3:54 PM EDT

## 2019-07-11 NOTE — ED Notes (Signed)
Formatting of this note might be different from the original.  Lunch tray ordered.   Electronically signed by Ezekiel Ina, NT at 07/11/2019  9:56 AM EDT

## 2019-07-11 NOTE — ED Notes (Signed)
Formatting of this note might be different from the original.  Ordered a hospital bed--Leslie  Electronically signed by Fredderick Erb A at 07/11/2019  5:08 AM EDT

## 2019-07-11 NOTE — Unmapped (Signed)
Formatting of this note is different from the original.  Pt was reassessed this morning.  Pt reported today that he felt increasingly suicidal and endorsed a plan to shoot himself.  Pt said he has access to a gun.  When asked if he felt safe, Pt said that he came to the hospital because he does not feel safe to be outside of the hospital.  He recommend continued inpatient placement.    38 y.o. male.   -Clinician reviewed note by Dr. Renaye Rakers.  Aside from these issues the patient reports that he feels very depressed today. He reports that he lost his house his wife and potentially his car in the past 24 hours. He says he feels depressed and thinks about suicide. He endorses passive suicidal ideation, but denies that he has an active plan to kill himself. Also reports difficulties with alcohol abuse. He states he been drinking nearly 1 case of beer daily for the past month. He said his last drink was approximately 2 days ago. He states that he is feeling withdrawal symptoms of shakiness and anger and nerves. He denies any history of alcohol withdrawal seizures. He is interested in getting placed in a detox program.  He reports that he went on a cocaine smoking binge for the past 24 hours. He says he smoked crack cocaine throughout the entire evening into early this morning.     Electronically signed by Earline Mayotte, [facility] at 07/11/2019  8:29 AM EDT

## 2019-07-11 NOTE — ED Notes (Signed)
Formatting of this note might be different from the original.  Ordered breakfast--Leslie   Electronically signed by Fredderick Erb A at 07/11/2019  5:11 AM EDT

## 2019-07-11 NOTE — ED Notes (Signed)
Formatting of this note might be different from the original.  States SI plan is to go to his friend's house and shoot himself w/friend's gun. When asked if he is homeless - advised "No, I can go stay on my friend's couches". States feels needs Inpt d/t feeling hopeless and "I have lost everything in the past 24 hours". States has hx SI attempt when he was a teenager - cut left anterior wrist. States "I have been fighting addiction all of my life".States feels safe in ED but voices he may hurt himself if d/c'd. Pt noted to be calm, cooperative.   Electronically signed by Dub Mikes, RN at 07/11/2019  8:18 AM EDT

## 2019-07-11 NOTE — Progress Notes (Signed)
Formatting of this note might be different from the original.  Patient meets criteria for inpatient treatment. No appropriate beds available at [facility] currently per [facility]. CSW faxed referrals to the following facilities for review:    [place], [facility], [address] [place], [place], [place], [address], [address], [place], [address], [place], [place].    TTS will continue to seek bed placement.    Trula Slade, MSW, LCSW  Clinical Social Worker  07/11/2019 1:33 PM      Electronically signed by Rona Ravens, LCSW at 07/11/2019  1:35 PM EDT

## 2019-07-11 NOTE — ED Notes (Signed)
Formatting of this note might be different from the original.  Pt noted to be lying on bed watching tv. No tremors noted. Pt called RN to room asking if he may have "some more of that medicine" and pt was shaking his hands in what appeared to be purposeful movements. Advised pt it is not time for med. After RN left room, observed pt return to watching tv and no tremors noted.   Electronically signed by Dub Mikes, RN at 07/11/2019  7:09 PM EDT

## 2019-07-11 NOTE — ED Provider Notes (Signed)
Formatting of this note is different from the original.  Emergency department observation rounding note    38 year old male has been in this department for over 15 hours at this point.  Originally presented for chest pain and suicidal ideations.  He was medically cleared by previous team, meets inpatient criteria per psychiatry.    COVID negative  Initial and delta high-sensitivity troponin: 5  UDS positive for cocaine  LFTs nonacute  BMP within normal limits  BNP within normal limits  Salicylate negative  Tylenol negative  Ethanol negative  CBC within normal limits  CXR:     IMPRESSION:   No acute abnormality of the lungs in AP portable projection.   EKG reviewed by Dr. Renaye Rakers without acute findings    Temperature 97.5 F, pulse of 73, BP 108/82, respiratory rate 18, SPO2 100% on room air.  Physical Exam   BP 108/82   Pulse 73   Temp (!) 97.5 F (36.4 C) (Oral)   Resp 18   Ht 5\' 9"  (1.753 m)   Wt 90.3 kg   SpO2 100%   BMI 29.39 kg/m     Physical Exam  Constitutional:       General: He is not in acute distress.     Appearance: Normal appearance. He is well-developed. He is not ill-appearing or diaphoretic.   HENT:      Head: Normocephalic and atraumatic.      Right Ear: External ear normal.      Left Ear: External ear normal.      Nose: Nose normal.   Eyes:      General: Vision grossly intact. Gaze aligned appropriately.   Neck:      Musculoskeletal: Normal range of motion.      Trachea: Trachea and phonation normal. No tracheal deviation.   Pulmonary:      Effort: Pulmonary effort is normal. No respiratory distress.   Abdominal:      General: There is no distension.   Musculoskeletal: Normal range of motion.   Skin:     General: Skin is warm and dry.   Neurological:      Mental Status: He is alert.      GCS: GCS eye subscore is 4. GCS verbal subscore is 5. GCS motor subscore is 6.      Comments: Speech is clear and goal oriented, follows commands  Major Cranial nerves without deficit, no facial  droop  Moves extremities without ataxia, coordination intact   Psychiatric:         Behavior: Behavior normal.     ED Course/Procedures     Clinical Course as of Jul 10 925   Sat Jul 10, 2019   1955 Chest pain unchanged with sublingual nitroglycerin.  We will try 1 mg of Ativan for cocaine induced chest pain.    [MT]   2123 With a negative troponin multiple hours after onset of his pain, very low suspicion for coronary artery disease at this time.  Patient reports his Ativan is helped his chest pain.  He is medically cleared at this point will transfer to behavioral health hold to be seen by the psychiatry team.  The patient is willing to stay voluntarily.  At this time I do not believe he needs involuntary commitment.      [MT]     Clinical Course User Index  [MT] Trifan, Kermit Balo, MD     Procedures    MDM   Per RN Chari Manning no events to report.  She asked that patient be placed on CIWA protocol in case of withdrawal, no signs of withdrawal at this time.  CIWA ordered.  Patient assessed, he is sleeping in bed comfortably no acute distress.  He is arousable to voice.  He reports that he just finished breakfast and that he enjoyed it.  He has no complaints or concerns at this time and feels well.  Patient remains medically cleared and is awaiting placement.    Note: Portions of this report may have been transcribed using voice recognition software. Every effort was made to ensure accuracy; however, inadvertent computerized transcription errors may still be present.    Bill Salinas, PA-C  07/11/19 0930      Milagros Loll, MD  07/12/19 [phone]    Electronically signed by Milagros Loll, MD at 07/12/2019  8:16 AM EDT    Associated attestation - Milagros Loll, MD - 07/12/2019  8:16 AM EDT  Formatting of this note might be different from the original.  Attestation: Medical screening examination/treatment/procedure(s) were performed by non-physician practitioner and as supervising physician I was immediately  available for consultation/collaboration.    EKG: EKG Interpretation    Date/Time:  Saturday July 10 2019 17:42:56 EDT  Ventricular Rate:  92  PR Interval:     QRS Duration: 89  QT Interval:  356  QTC Calculation: 441  R Axis:   97  Text Interpretation:  Sinus rhythm Borderline right axis deviation no STEMI  Confirmed by Alvester Chou [phone]) on 07/10/2019 7:42:59 PM

## 2019-07-11 NOTE — ED Notes (Signed)
Formatting of this note might be different from the original.  Pt noted to be shaking right hand when handed meal try to RN w/his left hand - left hand and tray noted to be stable.   Electronically signed by Dub Mikes, RN at 07/11/2019  4:11 PM EDT

## 2019-07-11 NOTE — ED Notes (Signed)
Formatting of this note might be different from the original.  Pt in shower.   Electronically signed by Dub Mikes, RN at 07/11/2019  7:53 AM EDT

## 2019-07-11 NOTE — ED Notes (Signed)
Formatting of this note might be different from the original.  Pt awake - put on paper scrubs as pt states he likes to sleep naked. Pt ambulated to bathroom w/o difficulty.   Electronically signed by Dub Mikes, RN at 07/11/2019  3:41 PM EDT

## 2019-07-12 DIAGNOSIS — F1094 Alcohol use, unspecified with alcohol-induced mood disorder: Secondary | ICD-10-CM | POA: Diagnosis present

## 2019-07-12 DIAGNOSIS — F1410 Cocaine abuse, uncomplicated: Secondary | ICD-10-CM | POA: Diagnosis present

## 2019-07-12 MED ORDER — GABAPENTIN 100 MG PO CAPS: 100.0000 mg | ORAL_CAPSULE | Freq: Three times a day (TID) | ORAL | 0 refills | Status: DC | PRN

## 2019-07-12 MED ORDER — GABAPENTIN 100 MG PO CAPS: 100.0000 mg | ORAL_CAPSULE | Freq: Three times a day (TID) | ORAL | Status: DC | PRN

## 2019-07-12 MED ORDER — GABAPENTIN 100 MG PO CAPS: 100.0000 mg | ORAL_CAPSULE | Freq: Three times a day (TID) | ORAL | 1 refills | Status: DC | PRN

## 2019-07-12 NOTE — Discharge Instructions (Signed)
Alcohol Abuse and Dependence Information, Adult Alcohol is a widely available drug. People drink alcohol in different amounts. People who drink alcohol very often and in large amounts often have problems during and after drinking. They may develop what is called an alcohol use disorder. There are two main types of alcohol use disorders:  Alcohol abuse. This is when you use alcohol too much or too often. You may use alcohol to make yourself feel happy or to reduce stress. You may have a hard time setting a limit on the amount you drink.  Alcohol dependence. This is when you use alcohol consistently for a period of time, and your body changes as a result. This can make it hard to stop drinking because you may start to feel sick or feel different when you do not use alcohol. These symptoms are known as withdrawal. How can alcohol abuse and dependence affect me? Alcohol abuse and dependence can have a negative effect on your life. Drinking too much can lead to addiction. You may feel like you need alcohol to function normally. You may drink alcohol before work in the morning, during the day, or as soon as you get home from work in the evening. These actions can result in:  Poor work performance.  Job loss.  Financial problems.  Car crashes or criminal charges from driving after drinking alcohol.  Problems in your relationships with friends and family.  Losing the trust and respect of coworkers, friends, and family. Drinking heavily over a long period of time can permanently damage your body and brain, and can cause lifelong health issues, such as:  Damage to your liver or pancreas.  Heart problems, high blood pressure, or stroke.  Certain cancers.  Decreased ability to fight infections.  Brain or nerve damage.  Depression.  Early (premature) death. If you are careless or you crave alcohol, it is easy to drink more than your body can handle (overdose). Alcohol overdose is a serious  situation that requires hospitalization. It may lead to permanent injuries or death. What can increase my risk?  Having a family history of alcohol abuse.  Having depression or other mental health conditions.  Beginning to drink at an early age.  Binge drinking often.  Experiencing trauma, stress, and an unstable home life during childhood.  Spending time with people who drink often. What actions can I take to prevent or manage alcohol abuse and dependence?  Do not drink alcohol if: ? Your health care provider tells you not to drink. ? You are pregnant, may be pregnant, or are planning to become pregnant.  If you drink alcohol: ? Limit how much you use to:  0-1 drink a day for women.  0-2 drinks a day for men. ? Be aware of how much alcohol is in your drink. In the [place] one drink equals one 12 oz bottle of beer (355 mL), one 5 oz glass of wine (148 mL), or one 1 oz glass of hard liquor (44 mL).  Stop drinking if you have been drinking too much. This can be very hard to do if you are used to abusing alcohol. If you begin to have withdrawal symptoms, talk with your health care provider or a person that you trust. These symptoms may include anxiety, shaky hands, headache, nausea, sweating, or not being able to sleep.  Choose to drink nonalcoholic beverages in social gatherings and places where there may be alcohol. Activity  Spend more time on activities that you enjoy that do   not involve alcohol, like hobbies or exercise.  Find healthy ways to cope with stress, such as exercise, meditation, or spending time with people you care about. General information  Talk to your family, coworkers, and friends about supporting you in your efforts to stop drinking. If they drink, ask them not to drink around you. Spend more time with people who do not drink alcohol.  If you think that you have an alcohol dependency problem: ? Tell friends or family about your concerns. ? Talk with your  health care provider or another health professional about where to get help. ? Work with a therapist and a chemical dependency counselor. ? Consider joining a support group for people who struggle with alcohol abuse and dependence. Where to find support   Your health care provider.  SMART Recovery: www.smartrecovery.org Therapy and support groups  Local treatment centers or chemical dependency counselors.  Local AA groups in your community: www.aa.org Where to find more information  Centers for Disease Control and Prevention: www.cdc.gov  National Institute on Alcohol Abuse and Alcoholism: www.niaaa.nih.gov  Alcoholics Anonymous (AA): www.aa.org Contact a health care provider if:  You drank more or for longer than you intended on more than one occasion.  You tried to stop drinking or to cut back on how much you drink, but you were not able to.  You often drink to the point of vomiting or passing out.  You want to drink so badly that you cannot think about anything else.  You have problems in your life due to drinking, but you continue to drink.  You keep drinking even though you feel anxious, depressed, or have experienced memory loss.  You have stopped doing the things you used to enjoy in order to drink.  You have to drink more than you used to in order to get the effect you want.  You experience anxiety, sweating, nausea, shakiness, and trouble sleeping when you try to stop drinking. Get help right away if:  You have thoughts about hurting yourself or others.  You have serious withdrawal symptoms, including: ? Confusion. ? Racing heart. ? High blood pressure. ? Fever. If you ever feel like you may hurt yourself or others, or have thoughts about taking your own life, get help right away. You can go to your nearest emergency department or call:  Your local emergency services (911 in the [place]  A suicide crisis helpline, such as the National Suicide Prevention  Lifeline at 1-800-273-8255. This is open 24 hours a day. Summary  Alcohol abuse and dependence can have a negative effect on your life. Drinking too much or too often can lead to addiction.  If you drink alcohol, limit how much you use.  If you are having trouble keeping your drinking under control, find ways to change your behavior. Hobbies, calming activities, exercise, or support groups can help.  If you feel you need help with changing your drinking habits, talk with your health care provider, a good friend, or a therapist, or go to an AA group. This information is not intended to replace advice given to you by your health care provider. Make sure you discuss any questions you have with your health care provider. Document Released: 09/17/2016 Document Revised: 01/12/2019 Document Reviewed: 12/01/2018 Elsevier Patient Education  2020 Elsevier Inc.  

## 2019-07-12 NOTE — Consult Note (Signed)
[facility] Psych ED Discharge  07/12/2019 10:03 AM Randall Parsons  MRN:  [id number] Principal Problem: Alcohol-induced mood disorder [facility]) Discharge Diagnoses: Principal Problem:   Alcohol-induced mood disorder ([place]) Active Problems:   Cocaine abuse ([place])  Subjective: "I am good."  Patient seen and evaluated in person by this provider.  He reports speaking with his wife this morning and feels he is stable at this time.  Denies suicidal/homicidal ideations, hallucinations, and withdrawal symptoms.  He currently goes to a recovery group and feels supported.  He was clean for a year until 2 weeks ago when he relapsed on crack/cocaine and alcohol.  Permission provided to speak to his wife, Nino Parsley.  She has no safety concerns except for her and her children as when he uses he can be, quite threatening.  She feels that "he is too selfish to hurt himself".  The wife was pleasant on the phone and reports he will have to go live elsewhere which the patient is aware due to his relapse and aggression prior to admission.  She also stated that she feels that he came for a place to stay for a couple of nights.  His wife does not have any fear for her safety at this time.  Patient calm and cooperative on assessment, stable psychiatrically for discharge.  Total Time spent with patient: 1 hour  Past Psychiatric History: substance abuse, anxiety, depression  Past Medical History:  Past Medical History:  Diagnosis Date  . CHF (congestive heart failure) ([place])   . Exposure to hepatitis B   . Exposure to hepatitis C   . Hepatitis C   . History of opioid abuse ([place])    reports heroin abuse, ending around 2017  . Hypertension   . Renal disorder    kidney stones    Past Surgical History:  Procedure Laterality Date  . APPENDECTOMY    . EYE SURGERY     secondary to dog bite   Family History:  Family History  Problem Relation Age of Onset  . Hypertension Father    Family Psychiatric  History:  none Social History:  Social History   Substance and Sexual Activity  Alcohol Use No     Social History   Substance and Sexual Activity  Drug Use Not Currently  . Types: IV   Comment: hx of heroin abuse (opioid addiction)    Social History   Socioeconomic History  . Marital status: Single    Spouse name: Not on file  . Number of children: Not on file  . Years of education: Not on file  . Highest education level: Not on file  Occupational History  . Not on file  Social Needs  . Financial resource strain: Not on file  . Food insecurity    Worry: Not on file    Inability: Not on file  . Transportation needs    Medical: Not on file    Non-medical: Not on file  Tobacco Use  . Smoking status: Former Research scientist (life sciences)  . Smokeless tobacco: Never Used  Substance and Sexual Activity  . Alcohol use: No  . Drug use: Not Currently    Types: IV    Comment: hx of heroin abuse (opioid addiction)  . Sexual activity: Not on file  Lifestyle  . Physical activity    Days per week: Not on file    Minutes per session: Not on file  . Stress: Not on file  Relationships  . Social Herbalist on  phone: Not on file    Gets together: Not on file    Attends religious service: Not on file    Active member of club or organization: Not on file    Attends meetings of clubs or organizations: Not on file    Relationship status: Not on file  Other Topics Concern  . Not on file  Social History Narrative  . Not on file    Has this patient used any form of tobacco in the last 30 days? (Cigarettes, Smokeless Tobacco, Cigars, and/or Pipes) A prescription for an FDA-approved tobacco cessation medication was offered at discharge and the patient refused  Current Medications: Current Facility-Administered Medications  Medication Dose Route Frequency Provider Last Rate Last Dose  . acetaminophen (TYLENOL) tablet 650 mg  650 mg Oral Q4H PRN Terald Sleeper, MD      . gabapentin (NEURONTIN) capsule  100 mg  100 mg Oral TID PRN Charm Rings, NP      . hydrOXYzine (ATARAX/VISTARIL) tablet 25 mg  25 mg Oral QHS Antony Madura, PA-C   25 mg at 07/11/19 2144  . meloxicam (MOBIC) tablet 15 mg  15 mg Oral Daily PRN Antony Madura, PA-C   15 mg at 07/11/19 0010  . ondansetron (ZOFRAN) tablet 4 mg  4 mg Oral Q8H PRN Terald Sleeper, MD      . thiamine (VITAMIN B-1) tablet 100 mg  100 mg Oral Daily Harlene Salts A, PA-C   100 mg at 07/11/19 1037   Or  . thiamine (B-1) injection 100 mg  100 mg Intravenous Daily Harlene Salts A, PA-C       Current Outpatient Medications  Medication Sig Dispense Refill  . b complex vitamins tablet Take 1 tablet by mouth daily with breakfast.    . CLONIDINE HCL PO Take 1 tablet by mouth at bedtime.    . hydrOXYzine (VISTARIL) 25 MG capsule Take 25 mg by mouth at bedtime.    . docusate sodium (COLACE) 100 MG capsule Take 1 tablet once or twice daily as needed for constipation while taking narcotic pain medicine (Patient not taking: Reported on 07/10/2019) 30 capsule 0  . ketorolac (TORADOL) 10 MG tablet Take 1 tablet (10 mg total) by mouth every 8 (eight) hours as needed for severe pain. (Patient not taking: Reported on 07/10/2019) 20 tablet 0  . meloxicam (MOBIC) 15 MG tablet Take 15 mg by mouth daily as needed for pain.    . promethazine (PHENERGAN) 25 MG tablet Take 1 tablet (25 mg total) by mouth every 6 (six) hours as needed for nausea or vomiting. (Patient not taking: Reported on 07/10/2019) 30 tablet 0  . tamsulosin (FLOMAX) 0.4 MG CAPS capsule Take 1 capsule (0.4 mg total) by mouth daily. (Patient not taking: Reported on 07/10/2019) 10 capsule 0   PTA Medications: (Not in a hospital admission)   Musculoskeletal: Strength & Muscle Tone: within normal limits Gait & Station: normal Patient leans: N/A  Psychiatric Specialty Exam: Physical Exam  Nursing note and vitals reviewed. Constitutional: He is oriented to person, place, and time. He appears  well-developed and well-nourished.  HENT:  Head: Normocephalic.  Neck: Normal range of motion.  Respiratory: Effort normal.  Musculoskeletal: Normal range of motion.  Neurological: [name] to person, place, and time.  Psychiatric: His speech is normal and behavior is normal. Judgment and thought content normal. His mood appears anxious. Cognition and memory are normal.    Review of Systems  Psychiatric/Behavioral:  Positive for substance abuse. The patient is nervous/anxious.   All other systems reviewed and are negative.   Blood pressure 126/87, pulse 80, temperature 98.3 F (36.8 C), temperature source Oral, resp. rate 18, height 5\' 9"  (1.753 m), weight 90.3 kg, SpO2 100 %.Body mass index is 29.39 kg/m.  General Appearance: Casual  Eye Contact:  Good  Speech:  Normal Rate  Volume:  Normal  Mood:  Anxious, mild  Affect:  Congruent  Thought Process:  Coherent and Descriptions of Associations: Intact  Orientation:  Full (Time, Place, and Person)  Thought Content:  WDL and Logical  Suicidal Thoughts:  No  Homicidal Thoughts:  No  Memory:  Immediate;   Good Recent;   Good Remote;   Good  Judgement:  Fair  Insight:  Fair  Psychomotor Activity:  Normal  Concentration:  Concentration: Good and Attention Span: Good  Recall:  Good  Fund of Knowledge:  Good  Language:  Good  Akathisia:  No  Handed:  Right  AIMS (if indicated):     Assets:  Leisure Time Physical Health Resilience Social Support  ADL's:  Intact  Cognition:  WNL  Sleep:        Demographic Factors:  Male and Caucasian  Loss Factors: NA  Historical Factors: NA  Risk Reduction Factors:   Sense of responsibility to family, Employed, Positive social support and Positive therapeutic relationship  Continued Clinical Symptoms:  Anxiety, mild  Cognitive Features That Contribute To Risk:  None    Suicide Risk:  Minimal: No identifiable suicidal ideation.  Patients presenting with no risk  factors but with morbid ruminations; may be classified as minimal risk based on the severity of the depressive symptoms    Plan Of Care/Follow-up recommendations:  Alcohol induced mood disorder: -Ativan alcohol detox protocol completed -Started gabapentin 100 mg 3 times daily PRN any withdrawal or anxiety Activity:  as tolerated Diet:  heart healthy diet  Disposition: discharge home , NP 07/12/2019, 10:03 AM

## 2019-07-12 NOTE — ED Notes (Signed)
Ordered bfast 

## 2019-07-12 NOTE — ED Notes (Signed)
Formatting of this note might be different from the original.  Ordered bfast  Electronically signed by Ernest Haber, NT at 07/12/2019  6:07 AM EDT

## 2019-07-12 NOTE — Unmapped (Signed)
Formatting of this note is different from the original.  [facility] Psych ED Discharge    07/12/2019 10:03 AM  Lucas Hicks   MRN:  [id number]  Principal Problem: Alcohol-induced mood disorder [facility])  Discharge Diagnoses: Principal Problem:    Alcohol-induced mood disorder (HCC)  Active Problems:    Cocaine abuse (HCC)    Subjective: "I am good."    Patient seen and evaluated in person by this provider.  He reports speaking with his wife this morning and feels he is stable at this time.  Denies suicidal/homicidal ideations, hallucinations, and withdrawal symptoms.  He currently goes to a recovery group and feels supported.  He was clean for a year until 2 weeks ago when he relapsed on crack/cocaine and alcohol.  Permission provided to speak to his wife, Glory Buff.  She has no safety concerns except for her and her children as when he uses he can be, quite threatening.  She feels that "he is too selfish to hurt himself".  The wife was pleasant on the phone and reports he will have to go live elsewhere which the patient is aware due to his relapse and aggression prior to admission.  She also stated that she feels that he came for a place to stay for a couple of nights.  His wife does not have any fear for her safety at this time.  Patient calm and cooperative on assessment, stable psychiatrically for discharge.    Total Time spent with patient: 1 hour    Past Psychiatric History: substance abuse, anxiety, depression    Past Medical History:   Past Medical History:   Diagnosis Date   ? CHF (congestive heart failure) (HCC)    ? Exposure to hepatitis B    ? Exposure to hepatitis C    ? Hepatitis C    ? History of opioid abuse (HCC)     reports heroin abuse, ending around 2017   ? Hypertension    ? Renal disorder     kidney stones     Past Surgical History:   Procedure Laterality Date   ? APPENDECTOMY     ? EYE SURGERY      secondary to dog bite     Family History:   Family History   Problem Relation Age of Onset   ?  Hypertension Father      Family Psychiatric  History: none  Social History:   Social History     Substance and Sexual Activity   Alcohol Use No     Social History     Substance and Sexual Activity   Drug Use Not Currently   ? Types: IV    Comment: hx of heroin abuse (opioid addiction)     Social History     Socioeconomic History   ? Marital status: Single     Spouse name: Not on file   ? Number of children: Not on file   ? Years of education: Not on file   ? Highest education level: Not on file   Occupational History   ? Not on file   Social Needs   ? Financial resource strain: Not on file   ? Food insecurity     Worry: Not on file     Inability: Not on file   ? Transportation needs     Medical: Not on file     Non-medical: Not on file   Tobacco Use   ? Smoking status: Former Smoker   ?  Smokeless tobacco: Never Used   Substance and Sexual Activity   ? Alcohol use: No   ? Drug use: Not Currently     Types: IV     Comment: hx of heroin abuse (opioid addiction)   ? Sexual activity: Not on file   Lifestyle   ? Physical activity     Days per week: Not on file     Minutes per session: Not on file   ? Stress: Not on file   Relationships   ? Social Wellsite geologist on phone: Not on file     Gets together: Not on file     Attends religious service: Not on file     Active member of club or organization: Not on file     Attends meetings of clubs or organizations: Not on file     Relationship status: Not on file   Other Topics Concern   ? Not on file   Social History Narrative   ? Not on file     Has this patient used any form of tobacco in the last 30 days? (Cigarettes, Smokeless Tobacco, Cigars, and/or Pipes) A prescription for an FDA-approved tobacco cessation medication was offered at discharge and the patient refused    Current Medications:  Current Facility-Administered Medications   Medication Dose Route Frequency Provider Last Rate Last Dose   ? acetaminophen (TYLENOL) tablet 650 mg  650 mg Oral Q4H PRN Terald Sleeper, MD       ? gabapentin (NEURONTIN) capsule 100 mg  100 mg Oral TID PRN Charm Rings, NP       ? hydrOXYzine (ATARAX/VISTARIL) tablet 25 mg  25 mg Oral QHS Antony Madura, PA-C   25 mg at 07/11/19 2144   ? meloxicam (MOBIC) tablet 15 mg  15 mg Oral Daily PRN Antony Madura, PA-C   15 mg at 07/11/19 0010   ? ondansetron (ZOFRAN) tablet 4 mg  4 mg Oral Q8H PRN Terald Sleeper, MD       ? thiamine (VITAMIN B-1) tablet 100 mg  100 mg Oral Daily Harlene Salts A, PA-C   100 mg at 07/11/19 1037    Or   ? thiamine (B-1) injection 100 mg  100 mg Intravenous Daily Harlene Salts A, PA-C         Current Outpatient Medications   Medication Sig Dispense Refill   ? b complex vitamins tablet Take 1 tablet by mouth daily with breakfast.     ? CLONIDINE HCL PO Take 1 tablet by mouth at bedtime.     ? hydrOXYzine (VISTARIL) 25 MG capsule Take 25 mg by mouth at bedtime.     ? docusate sodium (COLACE) 100 MG capsule Take 1 tablet once or twice daily as needed for constipation while taking narcotic pain medicine (Patient not taking: Reported on 07/10/2019) 30 capsule 0   ? ketorolac (TORADOL) 10 MG tablet Take 1 tablet (10 mg total) by mouth every 8 (eight) hours as needed for severe pain. (Patient not taking: Reported on 07/10/2019) 20 tablet 0   ? meloxicam (MOBIC) 15 MG tablet Take 15 mg by mouth daily as needed for pain.     ? promethazine (PHENERGAN) 25 MG tablet Take 1 tablet (25 mg total) by mouth every 6 (six) hours as needed for nausea or vomiting. (Patient not taking: Reported on 07/10/2019) 30 tablet 0   ? tamsulosin (FLOMAX) 0.4 MG CAPS capsule Take 1 capsule (0.4 mg total)  by mouth daily. (Patient not taking: Reported on 07/10/2019) 10 capsule 0     PTA Medications:  (Not in a hospital admission)    Musculoskeletal:  Strength & Muscle Tone: within normal limits  Gait & Station: normal  Patient leans: N/A    Psychiatric Specialty Exam:  Physical Exam   Nursing note and vitals reviewed.  Constitutional: He is  oriented to person, place, and time. He appears well-developed and well-nourished.   HENT:   Head: Normocephalic.   Neck: Normal range of motion.   Respiratory: Effort normal.   Musculoskeletal: Normal range of motion.   Neurological: [name] to person, place, and time.   Psychiatric: His speech is normal and behavior is normal. Judgment and thought content normal. His mood appears anxious. Cognition and memory are normal.     Review of Systems   Psychiatric/Behavioral: Positive for substance abuse. The patient is nervous/anxious.    All other systems reviewed and are negative.    Blood pressure 126/87, pulse 80, temperature 98.3 F (36.8 C), temperature source Oral, resp. rate 18, height 5\' 9"  (1.753 m), weight 90.3 kg, SpO2 100 %.Body mass index is 29.39 kg/m.   General Appearance: Casual   Eye Contact:  Good   Speech:  Normal Rate   Volume:  Normal   Mood:  Anxious, mild   Affect:  Congruent   Thought Process:  Coherent and Descriptions of Associations: Intact   Orientation:  Full (Time, Place, and Person)   Thought Content:  WDL and Logical   Suicidal Thoughts:  No   Homicidal Thoughts:  No   Memory:  Immediate;   Good  Recent;   Good  Remote;   Good   Judgement:  Fair   Insight:  Fair   Psychomotor Activity:  Normal   Concentration:  Concentration: Good and Attention Span: Good   Recall:  Good   Fund of Knowledge:  Good   Language:  Good   Akathisia:  No   Handed:  Right   AIMS (if indicated):      Assets:  Leisure Time  Physical Health  Resilience  Social Support   ADL's:  Intact   Cognition:  WNL   Sleep:        Demographic Factors:   Male and Caucasian    Loss Factors:  NA    Historical Factors:  NA    Risk Reduction Factors:    Sense of responsibility to family, Employed, Positive social support and Positive therapeutic relationship    Continued Clinical Symptoms:   Anxiety, mild    Cognitive Features That Contribute To Risk:   None      Suicide Risk:   Minimal: No identifiable suicidal  ideation.  Patients presenting with no risk factors but with morbid ruminations; may be classified as minimal risk based on the severity of the depressive symptoms    Plan Of Care/Follow-up recommendations:   Alcohol induced mood disorder:  -Ativan alcohol detox protocol completed  -Started gabapentin 100 mg 3 times daily PRN any withdrawal or anxiety  Activity:  as tolerated  Diet:  heart healthy diet    Disposition: discharge home  Nanine Means, NP  07/12/2019, 10:03 AM  Electronically signed by Zena Amos, MD at 07/13/2019  8:06 AM EDT

## 2019-07-14 ENCOUNTER — Encounter (HOSPITAL_COMMUNITY): Payer: Self-pay | Admitting: Emergency Medicine

## 2019-07-14 ENCOUNTER — Inpatient Hospital Stay
Admission: AD | Admit: 2019-07-14 | Discharge: 2019-07-16 | DRG: 881 | Disposition: A | Discharge status: Home / Self Care | Payer: No Typology Code available for payment source | Source: Intra-hospital | Attending: Psychiatry | Admitting: Psychiatry

## 2019-07-14 ENCOUNTER — Emergency Department (HOSPITAL_COMMUNITY)
Admission: EM | Admit: 2019-07-14 | Discharge: 2019-07-14 | Disposition: A | Discharge status: Other Acute Inpatient Hospital | Payer: Medicaid Other | Attending: Emergency Medicine | Admitting: Emergency Medicine

## 2019-07-14 ENCOUNTER — Emergency Department (HOSPITAL_COMMUNITY): Payer: Medicaid Other

## 2019-07-14 ENCOUNTER — Other Ambulatory Visit: Payer: Self-pay

## 2019-07-14 DIAGNOSIS — F10231 Alcohol dependence with withdrawal delirium: Secondary | ICD-10-CM | POA: Diagnosis present

## 2019-07-14 DIAGNOSIS — R45851 Suicidal ideations: Secondary | ICD-10-CM | POA: Diagnosis present

## 2019-07-14 DIAGNOSIS — Z79899 Other long term (current) drug therapy: Secondary | ICD-10-CM | POA: Diagnosis not present

## 2019-07-14 DIAGNOSIS — I110 Hypertensive heart disease with heart failure: Secondary | ICD-10-CM | POA: Diagnosis present

## 2019-07-14 DIAGNOSIS — F329 Major depressive disorder, single episode, unspecified: Secondary | ICD-10-CM | POA: Insufficient documentation

## 2019-07-14 DIAGNOSIS — F1410 Cocaine abuse, uncomplicated: Secondary | ICD-10-CM | POA: Diagnosis present

## 2019-07-14 DIAGNOSIS — Z87891 Personal history of nicotine dependence: Secondary | ICD-10-CM | POA: Insufficient documentation

## 2019-07-14 DIAGNOSIS — Z20828 Contact with and (suspected) exposure to other viral communicable diseases: Secondary | ICD-10-CM | POA: Insufficient documentation

## 2019-07-14 DIAGNOSIS — I509 Heart failure, unspecified: Secondary | ICD-10-CM | POA: Insufficient documentation

## 2019-07-14 DIAGNOSIS — K219 Gastro-esophageal reflux disease without esophagitis: Secondary | ICD-10-CM | POA: Diagnosis present

## 2019-07-14 DIAGNOSIS — F339 Major depressive disorder, recurrent, unspecified: Secondary | ICD-10-CM | POA: Diagnosis present

## 2019-07-14 DIAGNOSIS — B1920 Unspecified viral hepatitis C without hepatic coma: Secondary | ICD-10-CM | POA: Diagnosis present

## 2019-07-14 DIAGNOSIS — R079 Chest pain, unspecified: Secondary | ICD-10-CM

## 2019-07-14 DIAGNOSIS — Z87442 Personal history of urinary calculi: Secondary | ICD-10-CM

## 2019-07-14 DIAGNOSIS — F1010 Alcohol abuse, uncomplicated: Secondary | ICD-10-CM | POA: Diagnosis not present

## 2019-07-14 DIAGNOSIS — Z8249 Family history of ischemic heart disease and other diseases of the circulatory system: Secondary | ICD-10-CM

## 2019-07-14 DIAGNOSIS — F32A Depression, unspecified: Secondary | ICD-10-CM

## 2019-07-14 DIAGNOSIS — I10 Essential (primary) hypertension: Secondary | ICD-10-CM | POA: Insufficient documentation

## 2019-07-14 HISTORY — DX: Other psychoactive substance abuse, uncomplicated: F19.10

## 2019-07-14 LAB — CBC
HCT: 44.9 % (ref 39.0–52.0)
Hemoglobin: 15.4 g/dL (ref 13.0–17.0)
MCH: 31.2 pg (ref 26.0–34.0)
MCHC: 34.3 g/dL (ref 30.0–36.0)
MCV: 91.1 fL (ref 80.0–100.0)
Platelets: 335 10*3/uL (ref 150–400)
RBC: 4.93 MIL/uL (ref 4.22–5.81)
RDW: 13.9 % (ref 11.5–15.5)
WBC: 5.9 10*3/uL (ref 4.0–10.5)
nRBC: 0 % (ref 0.0–0.2)

## 2019-07-14 LAB — TROPONIN I (HIGH SENSITIVITY)
Troponin I (High Sensitivity): 4 ng/L (ref <18)
Troponin I (High Sensitivity): 4 ng/L (ref <18)

## 2019-07-14 LAB — PROTIME-INR
INR: 1.1 (ref 0.8–1.2)
Prothrombin Time: 13.9 seconds (ref 11.4–15.2)

## 2019-07-14 LAB — RAPID URINE DRUG SCREEN, HOSP PERFORMED
Amphetamines: NOT DETECTED
Barbiturates: NOT DETECTED
Benzodiazepines: NOT DETECTED
Cocaine: POSITIVE — AB
Opiates: NOT DETECTED
Tetrahydrocannabinol: NOT DETECTED

## 2019-07-14 LAB — BASIC METABOLIC PANEL
Anion gap: 14 (ref 5–15)
BUN: 13 mg/dL (ref 6–20)
CO2: 22 mmol/L (ref 22–32)
Calcium: 9.5 mg/dL (ref 8.9–10.3)
Chloride: 102 mmol/L (ref 98–111)
Creatinine, Ser: 1.16 mg/dL (ref 0.61–1.24)
GFR calc Af Amer: >60 mL/min (ref >60)
GFR calc non Af Amer: >60 mL/min (ref >60)
Glucose, Bld: 105 mg/dL — ABNORMAL HIGH (ref 70–99)
Potassium: 4 mmol/L (ref 3.5–5.1)
Sodium: 138 mmol/L (ref 135–145)

## 2019-07-14 LAB — SARS CORONAVIRUS 2 BY RT PCR (HOSPITAL ORDER, PERFORMED IN ~~LOC~~ HOSPITAL LAB): SARS Coronavirus 2: NEGATIVE

## 2019-07-14 MED ORDER — LORAZEPAM 2 MG/ML IJ SOLN: 0.0000 mg | Freq: Four times a day (QID) | INTRAMUSCULAR | Status: AC

## 2019-07-14 MED ORDER — HYDROXYZINE HCL 50 MG PO TABS
50.0000 mg | ORAL_TABLET | Freq: Four times a day (QID) | ORAL | Status: DC | PRN
  Administered 2019-07-14 – 2019-07-15 (×3): 50 mg via ORAL
  Filled 2019-07-14 (×3): qty 1

## 2019-07-14 MED ORDER — LORAZEPAM 2 MG PO TABS: 0.0000 mg | ORAL_TABLET | Freq: Two times a day (BID) | ORAL | Status: DC

## 2019-07-14 MED ORDER — SODIUM CHLORIDE 0.9% FLUSH: 3.0000 mL | Freq: Once | INTRAVENOUS | Status: DC

## 2019-07-14 MED ORDER — TRAZODONE HCL 100 MG PO TABS: 100.0000 mg | ORAL_TABLET | Freq: Every evening | ORAL | Status: DC | PRN

## 2019-07-14 MED ORDER — THIAMINE HCL 100 MG/ML IJ SOLN: 100.0000 mg | Freq: Every day | INTRAMUSCULAR | Status: DC

## 2019-07-14 MED ORDER — LORAZEPAM 2 MG PO TABS
0.0000 mg | ORAL_TABLET | Freq: Four times a day (QID) | ORAL | Status: AC
  Administered 2019-07-14 – 2019-07-15 (×6): 2 mg via ORAL
  Filled 2019-07-14 (×6): qty 1

## 2019-07-14 MED ORDER — LORAZEPAM 1 MG PO TABS
1.0000 mg | ORAL_TABLET | Freq: Once | ORAL | Status: AC
  Administered 2019-07-14: 1 mg via ORAL
  Filled 2019-07-14: qty 1

## 2019-07-14 MED ORDER — LORAZEPAM 2 MG/ML IJ SOLN: 0.0000 mg | Freq: Four times a day (QID) | INTRAMUSCULAR | Status: DC

## 2019-07-14 MED ORDER — LORAZEPAM 2 MG/ML IJ SOLN
1.0000 mg | Freq: Once | INTRAMUSCULAR | Status: AC
  Administered 2019-07-14: 1 mg via INTRAVENOUS
  Filled 2019-07-14: qty 1

## 2019-07-14 MED ORDER — VITAMIN B-1 100 MG PO TABS
100.0000 mg | ORAL_TABLET | Freq: Every day | ORAL | Status: DC
  Administered 2019-07-15 – 2019-07-16 (×2): 100 mg via ORAL
  Filled 2019-07-14 (×2): qty 1

## 2019-07-14 MED ORDER — LORAZEPAM 2 MG/ML IJ SOLN: 0.0000 mg | Freq: Two times a day (BID) | INTRAMUSCULAR | Status: DC

## 2019-07-14 MED ORDER — VITAMIN B-1 100 MG PO TABS
100.0000 mg | ORAL_TABLET | Freq: Every day | ORAL | Status: DC
  Filled 2019-07-14: qty 1

## 2019-07-14 MED ORDER — LORAZEPAM 1 MG PO TABS: 0.0000 mg | ORAL_TABLET | Freq: Two times a day (BID) | ORAL | Status: DC

## 2019-07-14 MED ORDER — MAGNESIUM HYDROXIDE 400 MG/5ML PO SUSP: 30.0000 mL | Freq: Every day | ORAL | Status: DC | PRN

## 2019-07-14 MED ORDER — RENA-VITE PO TABS
1.0000 | ORAL_TABLET | Freq: Every day | ORAL | Status: DC
  Administered 2019-07-15: 08:00:00 1 via ORAL
  Filled 2019-07-14: qty 1

## 2019-07-14 MED ORDER — ACETAMINOPHEN 325 MG PO TABS: 650.0000 mg | ORAL_TABLET | Freq: Four times a day (QID) | ORAL | Status: DC | PRN

## 2019-07-14 MED ORDER — FENTANYL CITRATE (PF) 100 MCG/2ML IJ SOLN
50.0000 ug | Freq: Once | INTRAMUSCULAR | Status: AC
  Administered 2019-07-14: 50 ug via INTRAVENOUS
  Filled 2019-07-14: qty 2

## 2019-07-14 MED ORDER — LORAZEPAM 1 MG PO TABS: 0.0000 mg | ORAL_TABLET | Freq: Four times a day (QID) | ORAL | Status: DC

## 2019-07-14 MED ORDER — ALUM & MAG HYDROXIDE-SIMETH 200-200-20 MG/5ML PO SUSP
30.0000 mL | ORAL | Status: DC | PRN
  Administered 2019-07-15: 17:00:00 30 mL via ORAL
  Filled 2019-07-14: qty 30
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
## 2019-07-14 NOTE — ED Notes (Addendum)
Pt wife [name] [phone]

## 2019-07-14 NOTE — Plan of Care (Signed)
  Problem: Education: Goal: Knowledge of [place] [facility] Education information/materials will improve Outcome: Progressing   Patient instructed on [place] Education and unit programing , able to verbalize understanding

## 2019-07-14 NOTE — ED Triage Notes (Signed)
Patient arrived with EMS from street reports central chest pain after Cocaine use this morning , he refused NTG sl by EMS , received ASA 324 mg po prior to arrival , history of chronic Cocaine/ IV drug  use.

## 2019-07-14 NOTE — Progress Notes (Signed)
Admission Note:  D: Pt appeared depressed  With  a flat affect.  Pt  denies SI / AVH at this time .38 year old white male presented to the hospital with cocaine related chest pain Patient relapsed 2 weeks passed on crack cocaine and alcohol. 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 Patient states he has a year of sobriety. Patient reported suicidal ideations with a plan to shoot himself. Stated his drug of choice was heroin but now it is cocaine. Patient lived with his wife but are now separated Patient loss his home of recent. Patient wants long term placement in rehab facility    Pt is redirectable and cooperative with assessment.   History: CHF, Exposure to Hepatitis , HTN,  Real Disorder   Allergies : Amoxicillin,    Hydrocodone   A: Pt admitted to unit per protocol, skin assessment and search done and no contraband found Skin intact .  Pt  educated on therapeutic milieu rules. Pt was introduced to milieu by nursing staff.    R: Pt was receptive to education about the milieu .  15 min safety checks started. Probation officer offered support

## 2019-07-14 NOTE — BH Assessment (Addendum)
Patient has been accepted to [facility].  Accepting physician is Dr. Dr. Sheliah Mends.  Attending Physician will be Dr. Weber Cooks.  Patient has been assigned to room 324, by Taylor.  Call report to [phone].  Representative/Transfer Coordinator is Print production planner [facility] TTS) Patient pre-admitted by [facility] Patient Access Butch Penny)   Patient's nurse Raquel Sarna, RN) has also been informed of bed acceptance.   [facility] Staff (Deirdre/Sarah, CSW) made aware of acceptance.

## 2019-07-14 NOTE — ED Notes (Signed)
Wallet, cell phone and pocket razor taken to security.  Sweatshirt, tshirt , shorts, book bag in locker, Security paged to wand [id number].

## 2019-07-14 NOTE — B[facility] Suicide Risk Assessment (Signed)
[facility] Admission Suicide Risk Assessment   Nursing information obtained from:    Demographic factors:    Current Mental Status:    Loss Factors:    Historical Factors:    Risk Reduction Factors:     Total Time spent with patient: 1 hour Principal Problem: Alcohol abuse Diagnosis:  Principal Problem:   Alcohol abuse Active Problems:   Cocaine abuse ([place])  Subjective Data: Patient seen chart reviewed.  Patient referred to [place] from hospital in [place] where he presented with alcohol and cocaine abuse and withdrawal.  Patient states that he gets suicidal ideation only when he feels he is being denied the treatment that he wants.  He has no plan or intent of acting on harming himself.  Has not actually done anything to harm himself.  Not having psychotic symptoms.  Complains of feeling very shaky and tremulous and anxious.  Cooperative with treatment.  Reports that he has been drinking and using cocaine recently.  Continued Clinical Symptoms:  Alcohol Use Disorder Identification Test Final Score (AUDIT): 21 The "Alcohol Use Disorders Identification Test", Guidelines for Use in Primary Care, Second Edition.  World Pharmacologist [facility]). Score between 0-7:  no or low risk or alcohol related problems. Score between 8-15:  moderate risk of alcohol related problems. Score between 16-19:  high risk of alcohol related problems. Score 20 or above:  warrants further diagnostic evaluation for alcohol dependence and treatment.   CLINICAL FACTORS:   Alcohol/Substance Abuse/Dependencies   Musculoskeletal: Strength & Muscle Tone: within normal limits Gait & Station: normal Patient leans: N/A  Psychiatric Specialty Exam: Physical Exam  Nursing note and vitals reviewed. Constitutional: He appears well-developed and well-nourished.  HENT:  Head: Normocephalic and atraumatic.  Eyes: Pupils are equal, round, and reactive to light. Conjunctivae are normal.  Neck: Normal range of motion.   Cardiovascular: Regular rhythm and normal heart sounds.  Respiratory: Effort normal. No respiratory distress.  GI: Soft.  Musculoskeletal: Normal range of motion.  Neurological: He is alert.  Skin: Skin is warm and dry.  Psychiatric: His speech is normal and behavior is normal. Judgment and thought content normal. Cognition and memory are normal. He exhibits a depressed mood.    Review of Systems  Constitutional: Negative.   HENT: Negative.   Eyes: Negative.   Respiratory: Negative.   Cardiovascular: Negative.   Gastrointestinal: Negative.   Musculoskeletal: Negative.   Skin: Negative.   Neurological: Negative.   Psychiatric/Behavioral: Positive for depression and substance abuse. Negative for hallucinations, memory loss and suicidal ideas. The patient is nervous/anxious and has insomnia.     Blood pressure 112/78, pulse 68, temperature 98.1 F (36.7 C), temperature source Oral, height 5\' 9"  (1.753 m), weight 84.4 kg, SpO2 97 %.Body mass index is 27.47 kg/m.  General Appearance: Casual  Eye Contact:  Good  Speech:  Clear and Coherent  Volume:  Normal  Mood:  Depressed  Affect:  Constricted  Thought Process:  Coherent  Orientation:  Full (Time, Place, and Person)  Thought Content:  Logical  Suicidal Thoughts:  Yes.  without intent/plan  Homicidal Thoughts:  No  Memory:  Immediate;   Fair Recent;   Fair Remote;   Fair  Judgement:  Fair  Insight:  Fair  Psychomotor Activity:  Normal  Concentration:  Concentration: Fair  Recall:  [employer] of Knowledge:  Fair  Language:  Fair  Akathisia:  No  Handed:  Right  AIMS (if indicated):     Assets:  Desire for Improvement  Physical Health  ADL's:  Intact  Cognition:  WNL  Sleep:         COGNITIVE FEATURES THAT CONTRIBUTE TO RISK:  Polarized thinking    SUICIDE RISK:   Minimal: No identifiable suicidal ideation.  Patients presenting with no risk factors but with morbid ruminations; may be classified as minimal risk  based on the severity of the depressive symptoms  PLAN OF CARE: Patient admitted to psychiatric unit.  We will proceed with detox protocol.  Engage in individual and group therapy.  Assess need for other medical or physical treatment.  Treatment team can meet with the patient and discuss options for substance abuse treatment.  Reassess suicidality prior to discharge.  I certify that inpatient services furnished can reasonably be expected to improve the patient's condition.   Mordecai Rasmussen, MD 07/14/2019, 6:06 PM

## 2019-07-14 NOTE — ED Notes (Signed)
Lunch tray ordered 

## 2019-07-14 NOTE — Progress Notes (Signed)
Pt accepted to [facility], bed 324. Please fax voluntary consent form to admissions at [phone].   Audree Camel, LCSW, [place] Disposition [facility]/TTS [phone]

## 2019-07-14 NOTE — ED Notes (Signed)
Patient on phone with wife, Lemmie Evens at this time.

## 2019-07-14 NOTE — ED Notes (Signed)
TTS in process 

## 2019-07-14 NOTE — ED Provider Notes (Signed)
[facility] EMERGENCY DEPARTMENT Provider Note   CSN: [id number] Arrival date & time: 07/14/19  8937     History   Chief Complaint Chief Complaint  Patient presents with  . Chest pain - Cocaine addiction    HPI Randall Parsons is a 38 y.o. male.  Please refer a chief complaint of chest pain.  Patient reports that over the past 2 days he has been staying at a crack house and has been consuming an excessive amount of cocaine, normally smokes cocaine.  States that also has been drinking alcohol, last drink was yesterday afternoon.  Denies prior history of withdrawal seizures.  States he has not taken any medication for this, called EMS and was given 324 aspirin, refused nitroglycerin because it gives him headaches.  Currently 5 out of 10, central chest pressure, has been constant since 1:00 this morning.  Patient states that he has been struggling with depression, has had suicidal thoughts, particularly suicidal thoughts if he is unable to get help for his drug addiction problem.  He is interested in getting help both for his depression and his cocaine and alcohol abuse.  States he has access to firearm, but does not have any firearms in his house.  Completed chart review, reviewed care everywhere, prior history for cocaine chest pain, prior echocardiogram with EF of 40 to 45%.      HPI  Past Medical History:  Diagnosis Date  . CHF (congestive heart failure) (HCC)   . Exposure to hepatitis B   . Exposure to hepatitis C   . Hepatitis C   . 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, ending around 2017  . Hypertension   . IV drug abuse (HCC)   . Renal disorder    kidney stones    Patient Active Problem List   Diagnosis Date Noted  . 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 Cocaine abuse (HCC) 07/12/2019  . Chest pain 11/28/2017  . Essential hypertension 11/28/2017  . Low back pain 11/28/2017  . NICM (nonischemic cardiomyopathy) (HCC)  11/28/2017  . Nephrolithiasis 11/28/2017    Past Surgical History:  Procedure Laterality Date  . APPENDECTOMY    . EYE SURGERY     secondary to dog bite        Home Medications    Prior to Admission medications   Medication Sig Start Date End Date Taking? Authorizing Provider  b complex vitamins tablet Take 1 tablet by mouth daily with breakfast.    [provider]  CLONIDINE HCL PO Take 1 tablet by mouth at bedtime.    [provider]  gabapentin (NEURONTIN) 100 MG capsule Take 1 capsule (100 mg total) by mouth 3 (three) times daily as needed (withdrawal symptoms). 07/12/19   Charm Rings, NP  hydrOXYzine (VISTARIL) 25 MG capsule Take 25 mg by mouth at bedtime.    [provider]  meloxicam (MOBIC) 15 MG tablet Take 15 mg by mouth daily as needed for pain.    [provider]    Family History Family History  Problem Relation Age of Onset  . Hypertension Father     Social History Social History   Tobacco Use  . Smoking status: Former Games developer  . Smokeless tobacco: Never Used  Substance Use Topics  . Alcohol use: No  . Drug use: Not Currently    Types: IV    Comment: hx of heroin abuse (opioid addiction)     Allergies  Amoxicillin   Review of Systems Review of Systems  Constitutional: Negative for chills and fever.  HENT: Negative for ear pain and sore throat.   Eyes: Negative for pain and visual disturbance.  Respiratory: Negative for cough and shortness of breath.   Cardiovascular: Positive for chest pain. Negative for palpitations.  Gastrointestinal: Negative for abdominal pain and vomiting.  Genitourinary: Negative for dysuria and hematuria.  Musculoskeletal: Negative for arthralgias and back pain.  Skin: Negative for color change and rash.  Neurological: Negative for seizures and syncope.  All other systems reviewed and are negative.    Physical Exam Updated Vital Signs BP (!) 135/93   Pulse 89   Temp 98 F  (36.7 C) (Oral)   Resp (!) 24   SpO2 97%   Physical Exam Vitals signs and nursing note reviewed.  Constitutional:      Appearance: He is well-developed.  HENT:     Head: Normocephalic and atraumatic.  Eyes:     Conjunctiva/sclera: Conjunctivae normal.  Neck:     Musculoskeletal: Neck supple.  Cardiovascular:     Rate and Rhythm: Normal rate and regular rhythm.     Heart sounds: No murmur.  Pulmonary:     Effort: Pulmonary effort is normal. No respiratory distress.     Breath sounds: Normal breath sounds.  Abdominal:     Palpations: Abdomen is soft.     Tenderness: There is no abdominal tenderness.  Musculoskeletal:        General: No swelling or tenderness.  Skin:    General: Skin is warm and dry.     Capillary Refill: Capillary refill takes less than 2 seconds.  Neurological:     General: No focal deficit present.     Mental Status: [name] to person, place, and time.  Psychiatric:     Comments: Somewhat depressed, no active SI, somewhat flat affect      ED Treatments / Results  Labs (all labs ordered are listed, but only abnormal results are displayed) Labs Reviewed  BASIC METABOLIC PANEL - Abnormal; Notable for the following components:      Result Value   Glucose, Bld 105 (*)    All other components within normal limits  CBC  PROTIME-INR  RAPID URINE DRUG SCREEN, HOSP PERFORMED  TROPONIN I (HIGH SENSITIVITY)  TROPONIN I (HIGH SENSITIVITY)    EKG EKG Interpretation  Date/Time:  Wednesday July 14 2019 06:45:16 EDT Ventricular Rate:  103 PR Interval:  136 QRS Duration: 84 QT Interval:  326 QTC Calculation: 427 R Axis:   121 Text Interpretation:  Sinus tachycardia No significant change when compared to prior ECG on 07/10/2019 Confirmed by Madalyn Rob [phone]) on 07/14/2019 8:07:05 AM   Radiology Dg Chest 2 View  Result Date: 07/14/2019 CLINICAL DATA:  Chest pain. EXAM: CHEST - 2 VIEW COMPARISON:  07/10/2019 and 04/16/2019  FINDINGS: The heart size and mediastinal contours are within normal limits. Both lungs are clear. The visualized skeletal structures are unremarkable. IMPRESSION: Normal exam. Electronically Signed   By: Lorriane Shire M.D.   On: 07/14/2019 07:38    Procedures Procedures (including critical care time)  Medications Ordered in ED Medications  sodium chloride flush (NS) 0.9 % injection 3 mL (has no administration in time range)  LORazepam (ATIVAN) tablet 1 mg (has no administration in time range)     Initial Impression / Assessment and Plan / ED Course  I have reviewed the triage vital signs and the nursing notes.  Pertinent labs & imaging results that were available during my care of the patient were reviewed by me and considered in my medical decision making (see chart for details).  Clinical Course as of Jul 14 1119  Wed Jul 14, 2019  6387 Completed initial assessment, currently 5 out of 10 chest pressure, will treat with benzodiazepines and reassess   [RD]  1000 Recheck patient, still having some chest pain, will get second troponin and try dose of IV Ativan   [RD]  1010 COCAINE(!): POSITIVE [RD]  1119 Reassessed, symptoms have completely resolved; psych recommending in patient   [RD]    Clinical Course User Index [RD] Milagros Loll, MD       29-year-old male presents to ER with chest pain, depression.  Suspect chest pain related to cocaine abuse.  Resolved with Ativan.  EKG without ischemic changes and troponin x2 within normal limits.  On chart review see documented history of heart failure but no prior history of coronary artery disease.  Doubt ACS.  Labs otherwise unremarkable, chest x-ray negative.  He is medically appropriate for further psychiatric care.  Patient reports ongoing depression related to his substance abuse.  He is interested in receiving help for both his depression and substance abuse.  Consulted TTS, plan for inpatient psychiatry admission.  Final  Clinical Impressions(s) / ED Diagnoses   Final diagnoses:  Cocaine abuse (HCC)  Chest pain, unspecified type  Depression, unspecified depression type    ED Discharge Orders    None       Milagros Loll, MD 07/14/19 1123

## 2019-07-14 NOTE — Tx Team (Signed)
Initial Treatment Plan 07/14/2019 6:25 PM [name] [id number]    PATIENT STRESSORS: Financial difficulties Loss of home  Substance abuse   PATIENT STRENGTHS: Ability for insight Active sense of humor Average or above average intelligence Capable of independent living Communication skills Supportive family/friends   PATIENT IDENTIFIED PROBLEMS: Suicide plan to shoot self 07/14/2019  Separation from Wife  07/14/2019  Substance Abuse  07/14/2019   Loss of Home  07/14/2019               DISCHARGE CRITERIA:  Ability to meet basic life and health needs Improved stabilization in mood, thinking, and/or behavior Motivation to continue treatment in a less acute level of care  PRELIMINARY DISCHARGE PLAN: Outpatient therapy Placement in alternative living arrangements  PATIENT/FAMILY INVOLVEMENT: This treatment plan has been presented to and reviewed with the patient, Randall Parsons, and/or family member,.  The patient and family have been given the opportunity to ask questions and make suggestions.  Leodis Liverpool, RN 07/14/2019, 6:25 PM

## 2019-07-14 NOTE — ED Notes (Signed)
ED Provider at bedside. 

## 2019-07-14 NOTE — H&P (Signed)
Psychiatric Admission Assessment Adult  Patient Identification: Randall Parsons MRN:  [id number] Date of Evaluation:  07/14/2019 Chief Complaint:  MDD Principal Diagnosis: Alcohol abuse Diagnosis:  Principal Problem:   Alcohol abuse Active Problems:   Cocaine abuse (HCC)  History of Present Illness: Patient seen and chart reviewed.  Patient referred to [place] from [facility] where he presented with alcohol and cocaine abuse and mood symptoms.  Patient reports that he relapsed into alcohol and cocaine abuse this past Friday and has been drinking and using crack cocaine heavily since then.  Claims to be drinking over a case of beer a day with his last drink being yesterday afternoon.  Says that his mood is been down and sad anxious.  He only has suicidal thoughts when he feels like he is not going to get the help that he wants.  He has no actual intention or plan of killing himself.  Prior to relapse on Friday he says his mood had been stable.  Denies any symptoms of depression leading up to that.  Has no particular reason for having relapsed.  Patient is not on any psychiatric medicine.  Had been attending celebrate recovery meetings as part of his sobriety.  Since relapsing this last week his wife has forbidden him to come back home.  Patient is now stating that he needs to get into "a long-term recovery".  Denies any auditory or visual hallucinations.  Denies any homicidal thoughts. Associated Signs/Symptoms: Depression Symptoms:  depressed mood, feelings of worthlessness/guilt, suicidal thoughts without plan, (Hypo) Manic Symptoms:  Impulsivity, Anxiety Symptoms:  Excessive Worry, Psychotic Symptoms:  None reported PTSD Symptoms: Negative Total Time spent with patient: 1 hour  Past Psychiatric History: Patient has a long history of alcohol and drug abuse.  He denies any history of withdrawal seizures.  He used the term "DTs" to describe his alcohol withdrawal but what he is describing is  symptomatic tremulousness and nausea but not true delirium and I do not think I see any evidence in his old chart of delirium.  Patient has presented with symptoms similar to this many times in the past and has been prescribed antidepressants.  He tells me he does not think antidepressants were ever of any lasting benefit and in any case he was not having depressive symptoms leading up to his relapse.  Only suicide attempt was a wrist cutting as a teenager and none since then.  As the patient himself says "he is "programmed out" meaning that he has attended such a large number of rehab programs that he is not sure where he could still go at this point.  Is the patient at risk to self? Yes.    Has the patient been a risk to self in the past 6 months? Yes.    Has the patient been a risk to self within the distant past? Yes.    Is the patient a risk to others? Yes.    Has the patient been a risk to others in the past 6 months? Yes.    Has the patient been a risk to others within the distant past? Yes.     Prior Inpatient Therapy:   Prior Outpatient Therapy:    Alcohol Screening: Patient refused Alcohol Screening Tool: Yes 1. How often do you have a drink containing alcohol?: 4 or more times a week 2. How many drinks containing alcohol do you have on a typical day when you are drinking?: 10 or more 3. How often do you have six  or more drinks on one occasion?: Daily or almost daily AUDIT-C Score: 12 4. How often during the last year have you found that you were not able to stop drinking once you had started?: Less than monthly 5. How often during the last year have you failed to do what was normally expected from you becasue of drinking?: Less than monthly 6. How often during the last year have you needed a first drink in the morning to get yourself going after a heavy drinking session?: Less than monthly 7. How often during the last year have you had a feeling of guilt of remorse after drinking?:  Less than monthly 8. How often during the last year have you been unable to remember what happened the night before because you had been drinking?: Less than monthly 9. Have you or someone else been injured as a result of your drinking?: No 10. Has a relative or friend or a doctor or another health worker been concerned about your drinking or suggested you cut down?: Yes, during the last year Alcohol Use Disorder Identification Test Final Score (AUDIT): 21 Alcohol Brief Interventions/Follow-up: Alcohol Education, Continued Monitoring Substance Abuse History in the last 12 months:  Yes.   Consequences of Substance Abuse: Medical Consequences:  Ongoing worsening of his heart problems his liver problems and his mood problems Previous Psychotropic Medications: Yes  Psychological Evaluations: Yes  Past Medical History:  Past Medical History:  Diagnosis Date  . CHF (congestive heart failure) (HCC)   . Exposure to hepatitis B   . Exposure to hepatitis C   . Hepatitis C   . 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, ending around 2017  . Hypertension   . IV drug abuse (HCC)   . Renal disorder    kidney stones    Past Surgical History:  Procedure Laterality Date  . APPENDECTOMY    . EYE SURGERY     secondary to dog bite   Family History:  Family History  Problem Relation Age of Onset  . Hypertension Father    Family Psychiatric  History: Positive for substance abuse and bipolar disorder Tobacco Screening: Have you used any form of tobacco in the last 30 days? (Cigarettes, Smokeless Tobacco, Cigars, and/or Pipes): Yes Tobacco use, Select all that apply: 4 or less cigarettes per day Are you interested in Tobacco Cessation Medications?: No, patient refused Counseled patient on smoking cessation including recognizing danger situations, developing coping skills and basic information about quitting provided: Yes Social History:  Social History   Substance and Sexual Activity   Alcohol Use No     Social History   Substance and Sexual Activity  Drug Use Not Currently  . Types: IV   Comment: hx of heroin abuse (opioid addiction)    Additional Social History:                           Allergies:   Allergies  Allergen Reactions  . Amoxicillin Hives    Did it involve swelling of the face/tongue/throat, SOB, or low BP? No Did it involve sudden or severe rash/hives, skin peeling, or any reaction on the inside of your mouth or nose? Yes Did you need to seek medical attention at a hospital or doctor's office? Yes When did it last happen?several years If all above answers are "NO", may proceed with cephalosporin use.    Lab Results:  Results for orders placed or performed during  the hospital encounter of 07/14/19 (from the past 48 hour(s))  Basic metabolic panel     Status: Abnormal   Collection Time: 07/14/19  6:51 AM  Result Value Ref Range   Sodium 138 135 - 145 mmol/L   Potassium 4.0 3.5 - 5.1 mmol/L   Chloride 102 98 - 111 mmol/L   CO2 22 22 - 32 mmol/L   Glucose, Bld 105 (H) 70 - 99 mg/dL   BUN 13 6 - 20 mg/dL   Creatinine, Ser 1.16 0.61 - 1.24 mg/dL   Calcium 9.5 8.9 - 10.3 mg/dL   GFR calc non Af Amer >60 >60 mL/min   GFR calc Af Amer >60 >60 mL/min   Anion gap 14 5 - 15    Comment: Performed at [facility], [place] [address], [place], [place] [address]  CBC     Status: None   Collection Time: 07/14/19  6:51 AM  Result Value Ref Range   WBC 5.9 4.0 - 10.5 K/uL   RBC 4.93 4.22 - 5.81 MIL/uL   Hemoglobin 15.4 13.0 - 17.0 g/dL   HCT 44.9 39.0 - 52.0 %   MCV 91.1 80.0 - 100.0 fL   MCH 31.2 26.0 - 34.0 pg   MCHC 34.3 30.0 - 36.0 g/dL   RDW 13.9 11.5 - 15.5 %   Platelets 335 150 - 400 K/uL   nRBC 0.0 0.0 - 0.2 %    Comment: Performed at [facility], [place] [address], [place], [place] [address]  Troponin I (High Sensitivity)     Status: None   Collection Time: 07/14/19  6:51 AM  Result Value Ref Range   Troponin I  (High Sensitivity) 4 <18 ng/L    Comment: (NOTE) Elevated high sensitivity troponin I (hsTnI) values and significant  changes across serial measurements may suggest ACS but many other  chronic and acute conditions are known to elevate hsTnI results.  Refer to the "Links" section for chest pain algorithms and additional  guidance. Performed at [facility], [place] [address], [place], [place] [address]   Protime-INR (order if Patient is taking Coumadin / Warfarin)     Status: None   Collection Time: 07/14/19  6:51 AM  Result Value Ref Range   Prothrombin Time 13.9 11.4 - 15.2 seconds   INR 1.1 0.8 - 1.2    Comment: (NOTE) INR goal varies based on device and disease states. Performed at [facility], [place] [address] [place], [place] [address]   Rapid urine drug screen (hospital performed)     Status: Abnormal   Collection Time: 07/14/19  8:27 AM  Result Value Ref Range   Opiates NONE DETECTED NONE DETECTED   Cocaine POSITIVE (A) NONE DETECTED   Benzodiazepines NONE DETECTED NONE DETECTED   Amphetamines NONE DETECTED NONE DETECTED   Tetrahydrocannabinol NONE DETECTED NONE DETECTED   Barbiturates NONE DETECTED NONE DETECTED    Comment: (NOTE) DRUG SCREEN FOR MEDICAL PURPOSES ONLY.  IF CONFIRMATION IS NEEDED FOR ANY PURPOSE, NOTIFY LAB WITHIN 5 DAYS. LOWEST DETECTABLE LIMITS FOR URINE DRUG SCREEN Drug Class                     Cutoff (ng/mL) Amphetamine and metabolites    1000 Barbiturate and metabolites    200 Benzodiazepine                 893 Tricyclics and metabolites     300 Opiates and metabolites  300 Cocaine and metabolites        300 THC                            50 Performed at [facility] [place] [facility], [address] [place], [place] [address]   Troponin I (High Sensitivity)     Status: None   Collection Time: 07/14/19  8:49 AM  Result Value Ref Range   Troponin I (High Sensitivity) 4 <18 ng/L    Comment: (NOTE) Elevated high sensitivity troponin I  (hsTnI) values and significant  changes across serial measurements may suggest ACS but many other  chronic and acute conditions are known to elevate hsTnI results.  Refer to the "Links" section for chest pain algorithms and additional  guidance. Performed at [facility], [address] [place], [place] [address]   SARS Coronavirus 2 [facility](Hospital order, Performed in [facility] hospital lab) Nasopharyngeal Nasopharyngeal Swab     Status: None   Collection Time: 07/14/19 10:30 AM   Specimen: Nasopharyngeal Swab  Result Value Ref Range   SARS Coronavirus 2 NEGATIVE NEGATIVE    Comment: (NOTE) If result is NEGATIVE SARS-CoV-2 target nucleic acids are NOT DETECTED. The SARS-CoV-2 RNA is generally detectable in upper and lower  respiratory specimens during the acute phase of infection. The lowest  concentration of SARS-CoV-2 viral copies this assay can detect is 250  copies / mL. A negative result does not preclude SARS-CoV-2 infection  and should not be used as the sole basis for treatment or other  patient management decisions.  A negative result may occur with  improper specimen collection / handling, submission of specimen other  than nasopharyngeal swab, presence of viral mutation(s) within the  areas targeted by this assay, and inadequate number of viral copies  (<250 copies / mL). A negative result must be combined with clinical  observations, patient history, and epidemiological information. If result is POSITIVE SARS-CoV-2 target nucleic acids are DETECTED. The SARS-CoV-2 RNA is generally detectable in upper and lower  respiratory specimens dur ing the acute phase of infection.  Positive  results are indicative of active infection with SARS-CoV-2.  Clinical  correlation with patient history and other diagnostic information is  necessary to determine patient infection status.  Positive results do  not rule out bacterial infection or co-infection with other viruses. If  result is PRESUMPTIVE POSTIVE SARS-CoV-2 nucleic acids MAY BE PRESENT.   A presumptive positive result was obtained on the submitted specimen  and confirmed on repeat testing.  While 2019 novel coronavirus  (SARS-CoV-2) nucleic acids may be present in the submitted sample  additional confirmatory testing may be necessary for epidemiological  and / or clinical management purposes  to differentiate between  SARS-CoV-2 and other Sarbecovirus currently known to infect humans.  If clinically indicated additional testing with an alternate test  methodology [id number]) is advised. The SARS-CoV-2 RNA is generally  detectable in upper and lower respiratory sp ecimens during the acute  phase of infection. The expected result is Negative. Fact Sheet for Patients:  BoilerBrush.com.cyhttps://www.fda.gov/media/136312/download Fact Sheet for Healthcare Providers: https://pope.com/https://www.fda.gov/media/136313/download This test is not yet approved or cleared by the [place] FDA and has been authorized for detection and/or diagnosis of SARS-CoV-2 by FDA under an Emergency Use Authorization (EUA).  This EUA will remain in effect (meaning this test can be used) for the duration of the COVID-19 declaration under Section 564(b)(1) of the Act, 21 U.S.C. section  360bbb-3(b)(1), unless the authorization is terminated or revoked sooner. Performed at [facility], [address], [place], [place] [address]     Blood Alcohol level:  Lab Results  Component Value Date   ETH <10 07/10/2019   ETH <10 08/22/2018    Metabolic Disorder Labs:  No results found for: HGBA1C, MPG No results found for: PROLACTIN No results found for: CHOL, TRIG, HDL, CHOLHDL, VLDL, LDLCALC  Current Medications: Current Facility-Administered Medications  Medication Dose Route Frequency Provider Last Rate Last Dose  . acetaminophen (TYLENOL) tablet 650 mg  650 mg Oral Q6H PRN Cristofano, Worthy Rancher, MD      . alum & mag hydroxide-simeth (MAALOX/MYLANTA)  200-200-20 MG/5ML suspension 30 mL  30 mL Oral Q4H PRN Cristofano, Paul A, MD      . hydrOXYzine (ATARAX/VISTARIL) tablet 50 mg  50 mg Oral Q6H PRN Clapacs, John T, MD      . LORazepam (ATIVAN) injection 0-4 mg  0-4 mg Intravenous Q6H Cristofano, Worthy Rancher, MD       Or  . LORazepam (ATIVAN) tablet 0-4 mg  0-4 mg Oral Q6H Cristofano, Worthy Rancher, MD      . Melene Muller ON 07/16/2019] LORazepam (ATIVAN) injection 0-4 mg  0-4 mg Intravenous Q12H Cristofano, Worthy Rancher, MD       Or  . Melene Muller ON 07/16/2019] LORazepam (ATIVAN) tablet 0-4 mg  0-4 mg Oral Q12H Cristofano, Paul A, MD      . magnesium hydroxide (MILK OF MAGNESIA) suspension 30 mL  30 mL Oral Daily PRN Cristofano, Worthy Rancher, MD      . Melene Muller ON 07/15/2019] multivitamin (RENA-VIT) tablet 1 tablet  1 tablet Oral Q breakfast Cristofano, Worthy Rancher, MD      . Melene Muller ON 07/15/2019] thiamine (VITAMIN B-1) tablet 100 mg  100 mg Oral Daily Cristofano, Worthy Rancher, MD       Or  . Melene Muller ON 07/15/2019] thiamine (B-1) injection 100 mg  100 mg Intravenous Daily Cristofano, Paul A, MD      . traZODone (DESYREL) tablet 100 mg  100 mg Oral QHS PRN Clapacs, Jackquline [place], MD       PTA Medications: Medications Prior to Admission  Medication Sig Dispense Refill Last Dose  . b complex vitamins tablet Take 1 tablet by mouth daily with breakfast.       Musculoskeletal: Strength & Muscle Tone: within normal limits Gait & Station: normal Patient leans: N/A  Psychiatric Specialty Exam: Physical Exam  Nursing note and vitals reviewed. Constitutional: He appears well-developed and well-nourished.  HENT:  Head: Normocephalic and atraumatic.  Eyes: Pupils are equal, round, and reactive to light. Conjunctivae are normal.  Neck: Normal range of motion.  Cardiovascular: Regular rhythm and normal heart sounds.  Respiratory: Effort normal. No respiratory distress.  GI: Soft.  Musculoskeletal: Normal range of motion.  Neurological: He is alert.  Skin: Skin is warm and dry.  Psychiatric: Judgment  normal. His mood appears anxious. His speech is delayed. He is slowed. Cognition and memory are normal. He expresses suicidal ideation. He expresses no suicidal plans.    Review of Systems  Constitutional: Negative.   HENT: Negative.   Eyes: Negative.   Respiratory: Negative.   Cardiovascular: Negative.   Gastrointestinal: Negative.   Musculoskeletal: Negative.   Skin: Negative.   Neurological: Negative.   Psychiatric/Behavioral: Positive for depression and substance abuse. Negative for hallucinations and suicidal ideas. The patient is nervous/anxious.     Blood pressure 112/78, pulse 68, temperature  98.1 F (36.7 C), temperature source Oral, height 5\' 9"  (1.753 m), weight 84.4 kg, SpO2 97 %.Body mass index is 27.47 kg/m.  General Appearance: Casual  Eye Contact:  Good  Speech:  Normal Rate  Volume:  Normal  Mood:  Euthymic  Affect:  Congruent  Thought Process:  Coherent  Orientation:  Full (Time, Place, and Person)  Thought Content:  Logical  Suicidal Thoughts:  Yes.  without intent/plan  Homicidal Thoughts:  No  Memory:  Immediate;   Fair Recent;   Fair Remote;   Fair  Judgement:  Fair  Insight:  Fair  Psychomotor Activity:  Normal  Concentration:  Concentration: Fair  Recall:  FiservFair  Fund of Knowledge:  Fair  Language:  Fair  Akathisia:  No  Handed:  Right  AIMS (if indicated):     Assets:  Desire for Improvement Physical Health  ADL's:  Intact  Cognition:  WNL  Sleep:       Treatment Plan Summary: Daily contact with patient to assess and evaluate symptoms and progress in treatment, Medication management and Plan Patient is on the standard detox protocol which I think is likely to be adequate.  He is not currently tremulous and his vital signs are all normal.  No sign of delirium.  Not diaphoretic.  Patient says that he usually has very dramatic withdrawal but at this point not clear that he is having any withdrawal symptoms nevertheless we will keep an eye on it to  make sure he does not develop DTs.  There is no indication for any other psychiatric medication.  Patient largely seems to be seeking a place to live now that his wife is not going to let him come back home.  We can talk with him about whether there are any sort of substance abuse programs available.  He lacks any insurance or financial coverage however which will complicate things.  PRN trazodone and Vistaril available.  Observation Level/Precautions:  15 minute checks  Laboratory:  Chemistry Profile  Psychotherapy:    Medications:    Consultations:    Discharge Concerns:    Estimated LOS:  Other:     Physician Treatment Plan for Primary Diagnosis: Alcohol abuse Long Term Goal(s): Improvement in symptoms so as ready for discharge  Short Term Goals: Ability to verbalize feelings will improve, Ability to disclose and discuss suicidal ideas and Ability to demonstrate self-control will improve  Physician Treatment Plan for Secondary Diagnosis: Principal Problem:   Alcohol abuse Active Problems:   Cocaine abuse (HCC)  Long Term Goal(s): Improvement in symptoms so as ready for discharge  Short Term Goals: Ability to maintain clinical measurements within normal limits will improve  I certify that inpatient services furnished can reasonably be expected to improve the patient's condition.    Mordecai RasmussenJohn Clapacs, MD 10/7/20206:09 PM

## 2019-07-14 NOTE — ED Triage Notes (Signed)
Formatting of this note might be different from the original.  Patient arrived with EMS from street reports central chest pain after Cocaine use this morning , he refused NTG sl by EMS , received ASA 324 mg po prior to arrival , history of chronic Cocaine/ IV drug  use.   Electronically signed by Maryellen Pile, RN at 07/14/2019  6:46 AM EDT

## 2019-07-14 NOTE — Progress Notes (Signed)
Formatting of this note might be different from the original.  Pt accepted to [facility], bed 324. Please fax voluntary consent form to admissions at [phone].     Wells Guiles, LCSW, LCAS  Disposition CSW  [facility]/TTS  [phone]  [phone]    Electronically signed by Maye Hides, LCSW at 07/14/2019  2:33 PM EDT

## 2019-07-14 NOTE — ED Notes (Signed)
Formatting of this note might be different from the original.  Patient on phone with wife, Leia Alf at this time.  Electronically signed by Holley Dexter, RN at 07/14/2019  2:13 PM EDT

## 2019-07-14 NOTE — ED Notes (Signed)
Formatting of this note might be different from the original.  Pt wife Leia Alf [phone]  Electronically signed by Gypsy Balsam E at 07/14/2019  2:00 PM EDT

## 2019-07-14 NOTE — ED Notes (Signed)
Formatting of this note might be different from the original.  TTS in process.  Electronically signed by Holley Dexter, RN at 07/14/2019  9:25 AM EDT

## 2019-07-14 NOTE — ED Notes (Signed)
Formatting of this note might be different from the original.  ED Provider at bedside.  Electronically signed by Holley Dexter, RN at 07/14/2019  8:19 AM EDT

## 2019-07-14 NOTE — ED Notes (Signed)
Formatting of this note might be different from the original.  Wallet, cell phone and pocket razor taken to security.   Sweatshirt, tshirt , shorts, book bag in locker, Security paged to wand H25.  Electronically signed by Holley Dexter, RN at 07/14/2019  2:12 PM EDT

## 2019-07-14 NOTE — ED Provider Notes (Signed)
Formatting of this note is different from the original.    [facility] EMERGENCY DEPARTMENT  Provider Note    CSN: [id number]  Arrival date & time: 07/14/19  4696        History    Chief Complaint  Chief Complaint   Patient presents with   ? Chest pain - Cocaine addiction     HPI  Lucas Hicks is a 38 y.o. male.  Please refer a chief complaint of chest pain.  Patient reports that over the past 2 days he has been staying at a crack house and has been consuming an excessive amount of cocaine, normally smokes cocaine.  States that also has been drinking alcohol, last drink was yesterday afternoon.  Denies prior history of withdrawal seizures.  States he has not taken any medication for this, called EMS and was given 324 aspirin, refused nitroglycerin because it gives him headaches.  Currently 5 out of 10, central chest pressure, has been constant since 1:00 this morning.    Patient states that he has been struggling with depression, has had suicidal thoughts, particularly suicidal thoughts if he is unable to get help for his drug addiction problem.  He is interested in getting help both for his depression and his cocaine and alcohol abuse.  States he has access to firearm, but does not have any firearms in his house.    Completed chart review, reviewed care everywhere, prior history for cocaine chest pain, prior echocardiogram with EF of 40 to 45%.         HPI    Past Medical History:   Diagnosis Date   ? CHF (congestive heart failure) (HCC)    ? Exposure to hepatitis B    ? Exposure to hepatitis C    ? Hepatitis C    ? History of opioid abuse (HCC)     reports heroin abuse, ending around 2017   ? Hypertension    ? IV drug abuse (HCC)    ? Renal disorder     kidney stones     Patient Active Problem List    Diagnosis Date Noted   ? 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 Cocaine abuse (HCC) 07/12/2019   ? Chest pain 11/28/2017   ? Essential hypertension 11/28/2017   ? Low back pain 11/28/2017    ? NICM (nonischemic cardiomyopathy) (HCC) 11/28/2017   ? Nephrolithiasis 11/28/2017     Past Surgical History:   Procedure Laterality Date   ? APPENDECTOMY     ? EYE SURGERY      secondary to dog bite       Home Medications      Prior to Admission medications    Medication Sig Start Date End Date Taking? Authorizing Provider   b complex vitamins tablet Take 1 tablet by mouth daily with breakfast.    [provider]   CLONIDINE HCL PO Take 1 tablet by mouth at bedtime.    [provider]   gabapentin (NEURONTIN) 100 MG capsule Take 1 capsule (100 mg total) by mouth 3 (three) times daily as needed (withdrawal symptoms). 07/12/19   Charm Rings, NP   hydrOXYzine (VISTARIL) 25 MG capsule Take 25 mg by mouth at bedtime.    [provider]   meloxicam (MOBIC) 15 MG tablet Take 15 mg by mouth daily as needed for pain.    [provider]     Family History  Family History   Problem Relation  Age of Onset   ? Hypertension Father      Social History  Social History     Tobacco Use   ? Smoking status: Former Smoker   ? Smokeless tobacco: Never Used   Substance Use Topics   ? Alcohol use: No   ? Drug use: Not Currently     Types: IV     Comment: hx of heroin abuse (opioid addiction)     Allergies    Amoxicillin    Review of Systems  Review of Systems   Constitutional: Negative for chills and fever.   HENT: Negative for ear pain and sore throat.    Eyes: Negative for pain and visual disturbance.   Respiratory: Negative for cough and shortness of breath.    Cardiovascular: Positive for chest pain. Negative for palpitations.   Gastrointestinal: Negative for abdominal pain and vomiting.   Genitourinary: Negative for dysuria and hematuria.   Musculoskeletal: Negative for arthralgias and back pain.   Skin: Negative for color change and rash.   Neurological: Negative for seizures and syncope.   All other systems reviewed and are negative.    Physical Exam  Updated Vital Signs  BP (!) 135/93    Pulse 89   Temp 98 F (36.7 C) (Oral)   Resp (!) 24   SpO2 97%     Physical Exam  Vitals signs and nursing note reviewed.   Constitutional:       Appearance: He is well-developed.   HENT:      Head: Normocephalic and atraumatic.   Eyes:      Conjunctiva/sclera: Conjunctivae normal.   Neck:      Musculoskeletal: Neck supple.   Cardiovascular:      Rate and Rhythm: Normal rate and regular rhythm.      Heart sounds: No murmur.   Pulmonary:      Effort: Pulmonary effort is normal. No respiratory distress.      Breath sounds: Normal breath sounds.   Abdominal:      Palpations: Abdomen is soft.      Tenderness: There is no abdominal tenderness.   Musculoskeletal:         General: No swelling or tenderness.   Skin:     General: Skin is warm and dry.      Capillary Refill: Capillary refill takes less than 2 seconds.   Neurological:      General: No focal deficit present.      Mental Status: [name] to person, place, and time.   Psychiatric:      Comments: Somewhat depressed, no active SI, somewhat flat affect     ED Treatments / Results   Labs  (all labs ordered are listed, but only abnormal results are displayed)  Labs Reviewed   BASIC METABOLIC PANEL - Abnormal; Notable for the following components:       Result Value    Glucose, Bld 105 (*)     All other components within normal limits   CBC   PROTIME-INR   RAPID URINE DRUG SCREEN, HOSP PERFORMED   TROPONIN I (HIGH SENSITIVITY)   TROPONIN I (HIGH SENSITIVITY)     EKG  EKG Interpretation    Date/Time:  Wednesday July 14 2019 06:45:16 EDT  Ventricular Rate:  103  PR Interval:  136  QRS Duration: 84  QT Interval:  326  QTC Calculation: 427  R Axis:   121  Text Interpretation:  Sinus tachycardia No significant change when compared  to prior ECG on 07/10/2019 Confirmed by Marianna Fuss [phone]) on 07/14/2019 8:07:05 AM    Radiology  Dg Chest 2 View    Result Date: 07/14/2019  CLINICAL DATA:  Chest pain. EXAM: CHEST - 2 VIEW COMPARISON:  07/10/2019 and  04/16/2019 FINDINGS: The heart size and mediastinal contours are within normal limits. Both lungs are clear. The visualized skeletal structures are unremarkable. IMPRESSION: Normal exam. Electronically Signed   By: Francene Boyers M.D.   On: 07/14/2019 07:38     Procedures  Procedures (including critical care time)    Medications Ordered in ED  Medications   sodium chloride flush (NS) 0.9 % injection 3 mL (has no administration in time range)   LORazepam (ATIVAN) tablet 1 mg (has no administration in time range)     Initial Impression / Assessment and Plan / ED Course   I have reviewed the triage vital signs and the nursing notes.    Pertinent labs & imaging results that were available during my care of the patient were reviewed by me and considered in my medical decision making (see chart for details).    Clinical Course as of Jul 14 1119   Wed Jul 14, 2019   2956 Completed initial assessment, currently 5 out of 10 chest pressure, will treat with benzodiazepines and reassess    [RD]   1000 Recheck patient, still having some chest pain, will get second troponin and try dose of IV Ativan    [RD]   1010 COCAINE(!): POSITIVE [RD]   1119 Reassessed, symptoms have completely resolved; psych recommending in patient    [RD]     Clinical Course User Index  [RD] Milagros Loll, MD         44-year-old male presents to ER with chest pain, depression.  Suspect chest pain related to cocaine abuse.  Resolved with Ativan.  EKG without ischemic changes and troponin x2 within normal limits.  On chart review see documented history of heart failure but no prior history of coronary artery disease.  Doubt ACS.  Labs otherwise unremarkable, chest x-ray negative.  He is medically appropriate for further psychiatric care.  Patient reports ongoing depression related to his substance abuse.  He is interested in receiving help for both his depression and substance abuse.  Consulted TTS, plan for inpatient psychiatry admission.    Final  Clinical Impressions(s) / ED Diagnoses     Final diagnoses:   Cocaine abuse (HCC)   Chest pain, unspecified type   Depression, unspecified depression type     ED Discharge Orders     None         Milagros Loll, MD  07/14/19 1123    Electronically signed by Milagros Loll, MD at 07/14/2019 11:23 AM EDT

## 2019-07-14 NOTE — ED Notes (Signed)
Formatting of this note might be different from the original.  Lunch tray ordered  Electronically signed by Gypsy Balsam E at 07/14/2019 11:23 AM EDT
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
## 2019-07-14 NOTE — Unmapped (Signed)
Formatting of this note is different from the original.  Patient has been accepted to [facility].   Accepting physician is Dr. Dr. Brien Few.   Attending Physician will be Dr. Toni Amend.   Patient has been assigned to room 324, by [facility] Charge Nurse [place].   Call report to [phone].   Representative/Transfer Coordinator is Psychologist, prison and probation services [facility] TTS)  Patient pre-admitted by [facility] Patient Access [facility])    Patient's nurse Irving Burton, RN) has also been informed of bed acceptance.    [facility] Staff (Deirdre/Sarah, CSW) made aware of acceptance.  Electronically signed by Wilmon Arms, Counselor at 07/14/2019  3:02 PM EDT

## 2019-07-15 LAB — HEPATIC FUNCTION PANEL
ALT: 30 U/L (ref 0–44)
AST: 23 U/L (ref 15–41)
Albumin: 3.9 g/dL (ref 3.5–5.0)
Alkaline Phosphatase: 66 U/L (ref 38–126)
Bilirubin, Direct: <0.1 mg/dL (ref 0.0–0.2)
Total Bilirubin: 0.7 mg/dL (ref 0.3–1.2)
Total Protein: 7.1 g/dL (ref 6.5–8.1)

## 2019-07-15 LAB — LIPID PANEL
Cholesterol: 170 mg/dL (ref 0–200)
HDL: 40 mg/dL — ABNORMAL LOW (ref >40)
LDL Cholesterol: 112 mg/dL — ABNORMAL HIGH (ref 0–99)
Total CHOL/HDL Ratio: 4.3 RATIO
Triglycerides: 90 mg/dL (ref <150)
VLDL: 18 mg/dL (ref 0–40)

## 2019-07-15 LAB — TSH: TSH: 2.494 u[IU]/mL (ref 0.350–4.500)

## 2019-07-15 MED ORDER — PANTOPRAZOLE SODIUM 40 MG PO TBEC
40.0000 mg | DELAYED_RELEASE_TABLET | Freq: Every day | ORAL | Status: DC
  Administered 2019-07-15 – 2019-07-16 (×2): 40 mg via ORAL
  Filled 2019-07-15 (×2): qty 1

## 2019-07-15 MED ORDER — ADULT MULTIVITAMIN W/MINERALS CH
1.0000 | ORAL_TABLET | Freq: Every day | ORAL | Status: DC
  Administered 2019-07-16: 1 via ORAL
  Filled 2019-07-15: qty 1

## 2019-07-15 MED ORDER — GABAPENTIN 300 MG PO CAPS
300.0000 mg | ORAL_CAPSULE | Freq: Three times a day (TID) | ORAL | Status: DC
  Administered 2019-07-15 – 2019-07-16 (×2): 300 mg via ORAL
  Filled 2019-07-15 (×2): qty 1

## 2019-07-15 NOTE — Progress Notes (Signed)
EKG done and complete , placed on file in the chart

## 2019-07-15 NOTE — Tx Team (Addendum)
Interdisciplinary Treatment and Diagnostic Plan Update  07/15/2019 Time of Session: 9:00AM Randall Parsons MRN: [id number]  Principal Diagnosis: Alcohol abuse  Secondary Diagnoses: Principal Problem:   Alcohol abuse Active Problems:   Cocaine abuse ([place])   Current Medications:  Current Facility-Administered Medications  Medication Dose Route Frequency Provider Last Rate Last Dose  . acetaminophen (TYLENOL) tablet 650 mg  650 mg Oral Q6H PRN Cristofano, Dorene Ar, MD      . alum & mag hydroxide-simeth (MAALOX/MYLANTA) 200-200-20 MG/5ML suspension 30 mL  30 mL Oral Q4H PRN Cristofano, Paul A, MD      . hydrOXYzine (ATARAX/VISTARIL) tablet 50 mg  50 mg Oral Q6H PRN Clapacs, Madie Reno, MD   50 mg at 07/15/19 1607  . LORazepam (ATIVAN) injection 0-4 mg  0-4 mg Intravenous Q6H Cristofano, Dorene Ar, MD       Or  . LORazepam (ATIVAN) tablet 0-4 mg  0-4 mg Oral Q6H Cristofano, Dorene Ar, MD   2 mg at 07/15/19 1208  . [START ON 07/16/2019] LORazepam (ATIVAN) injection 0-4 mg  0-4 mg Intravenous Q12H Cristofano, Dorene Ar, MD       Or  . Derrill Memo ON 07/16/2019] LORazepam (ATIVAN) tablet 0-4 mg  0-4 mg Oral Q12H Cristofano, Paul A, MD      . magnesium hydroxide (MILK OF MAGNESIA) suspension 30 mL  30 mL Oral Daily PRN Cristofano, Dorene Ar, MD      . multivitamin (RENA-VIT) tablet 1 tablet  1 tablet Oral Q breakfast Cristofano, Dorene Ar, MD   1 tablet at 07/15/19 0826  . thiamine (VITAMIN B-1) tablet 100 mg  100 mg Oral Daily Cristofano, Paul A, MD   100 mg at 07/15/19 3710   Or  . thiamine (B-1) injection 100 mg  100 mg Intravenous Daily Cristofano, Paul A, MD      . traZODone (DESYREL) tablet 100 mg  100 mg Oral QHS PRN Clapacs, Madie Reno, MD       PTA Medications: Medications Prior to Admission  Medication Sig Dispense Refill Last Dose  . b complex vitamins tablet Take 1 tablet by mouth daily with breakfast.       Patient Stressors: Financial difficulties Loss of home  Substance abuse  Patient Strengths: Ability  for insight Active sense of humor Average or above average intelligence Capable of independent living Communication skills Supportive family/friends  Treatment Modalities: Medication Management, Group therapy, Case management,  1 to 1 session with clinician, Psychoeducation, Recreational therapy.   Physician Treatment Plan for Primary Diagnosis: Alcohol abuse Long Term Goal(s): Improvement in symptoms so as ready for discharge Improvement in symptoms so as ready for discharge   Short Term Goals: Ability to verbalize feelings will improve Ability to disclose and discuss suicidal ideas Ability to demonstrate self-control will improve Ability to maintain clinical measurements within normal limits will improve  Medication Management: Evaluate patient's response, side effects, and tolerance of medication regimen.  Therapeutic Interventions: 1 to 1 sessions, Unit Group sessions and Medication administration.  Evaluation of Outcomes: Not Progressing  Physician Treatment Plan for Secondary Diagnosis: Principal Problem:   Alcohol abuse Active Problems:   Cocaine abuse ([place])  Long Term Goal(s): Improvement in symptoms so as ready for discharge Improvement in symptoms so as ready for discharge   Short Term Goals: Ability to verbalize feelings will improve Ability to disclose and discuss suicidal ideas Ability to demonstrate self-control will improve Ability to maintain clinical measurements within normal limits will improve     Medication Management:  Evaluate patient's response, side effects, and tolerance of medication regimen.  Therapeutic Interventions: 1 to 1 sessions, Unit Group sessions and Medication administration.  Evaluation of Outcomes: Not Progressing   RN Treatment Plan for Primary Diagnosis: Alcohol abuse Long Term Goal(s): Knowledge of disease and therapeutic regimen to maintain health will improve  Short Term Goals: Ability to demonstrate self-control, Ability to  participate in decision making will improve, Ability to verbalize feelings will improve, Ability to disclose and discuss suicidal ideas and Ability to identify and develop effective coping behaviors will improve  Medication Management: RN will administer medications as ordered by provider, will assess and evaluate patient's response and provide education to patient for prescribed medication. RN will report any adverse and/or side effects to prescribing provider.  Therapeutic Interventions: 1 on 1 counseling sessions, Psychoeducation, Medication administration, Evaluate responses to treatment, Monitor vital signs and CBGs as ordered, Perform/monitor CIWA, COWS, AIMS and Fall Risk screenings as ordered, Perform wound care treatments as ordered.  Evaluation of Outcomes: Not Progressing   LCSW Treatment Plan for Primary Diagnosis: Alcohol abuse Long Term Goal(s): Safe transition to appropriate next level of care at discharge, Engage patient in therapeutic group addressing interpersonal concerns.  Short Term Goals: Engage patient in aftercare planning with referrals and resources, Increase social support, Increase ability to appropriately verbalize feelings, Increase emotional regulation, Facilitate acceptance of mental health diagnosis and concerns and Facilitate patient progression through stages of change regarding substance use diagnoses and concerns  Therapeutic Interventions: Assess for all discharge needs, 1 to 1 time with Social worker, Explore available resources and support systems, Assess for adequacy in community support network, Educate family and significant other(s) on suicide prevention, Complete Psychosocial Assessment, Interpersonal group therapy.  Evaluation of Outcomes: Not Progressing   Progress in Treatment: Attending groups: No. Participating in groups: No. Taking medication as prescribed: Yes. Toleration medication: Yes. Family/Significant other contact made: Yes,  individual(s) contacted:  SPE completed with Glory Buff, wife. Patient understands diagnosis: Yes. Discussing patient identified problems/goals with staff: Yes. Medical problems stabilized or resolved: Yes. Denies suicidal/homicidal ideation: Yes. Issues/concerns per patient self-inventory: No. Other: none  New problem(s) identified: No, Describe:  none  New Short Term/Long Term Goal(s): detox, elimination of symptoms of psychosis, medication management for mood stabilization; elimination of SI thoughts; development of comprehensive mental wellness/sobriety plan.  Patient Goals: "detox in a safe environment and look at my treatment options and goals"   Discharge Plan or Barriers: Patient reports plans to dishcarge to a treatment facility.  Reason for Continuation of Hospitalization: Depression Medication stabilization Suicidal ideation Withdrawal symptoms  Estimated Length of Stay: 1-7 days  Recreational Therapy: Patient Stressors: N/A Patient Goal: Patient will engage in groups without prompting or encouragement from LRT x3 group sessions within 5 recreation therapy group sessions  Attendees: Patient: Randall Parsons 07/15/2019 1:08 PM  Physician: Dr. Toni Amend, MD 07/15/2019 1:08 PM  Nursing:  07/15/2019 1:08 PM  RN Care Manager: 07/15/2019 1:08 PM  Social Worker: Penni Homans, LCSW 07/15/2019 1:08 PM  Recreational Therapist: Garret Reddish, Drue Flirt, LRT 07/15/2019 1:08 PM  Other: Iris Pert, LCSW 07/15/2019 1:08 PM  Other:  07/15/2019 1:08 PM  Other: 07/15/2019 1:08 PM    Scribe for Treatment Team: Harden Mo, LCSW 07/15/2019 1:08 PM

## 2019-07-15 NOTE — B[facility] Counselor (Signed)
CSW faxed [facility] referral.  CSW received confirmation of receipt.  Assunta Curtis, MSW, LCSW 07/15/2019 1:07 PM

## 2019-07-15 NOTE — Progress Notes (Signed)
Recreation Therapy Notes   Date: 07/15/2019  Time: 9:30 am   Location: Craft room   Behavioral response: N/A   Intervention Topic: Happiness  Discussion/Intervention: Patient did not attend group.   Clinical Observations/Feedback:  Patient did not attend group.   Keyante Durio LRT/CTRS        Brooks Stotz 07/15/2019 10:48 AM 

## 2019-07-15 NOTE — Plan of Care (Signed)
Pt rates depression 7/10 and anxiety 9/10. Pt denies SI, HI and AVH. Pt was educated on care plan and verbalizes understanding. Collier Bullock RN Problem: Education: Goal: Knowledge of [place] [facility] Education information/materials will improve Outcome: Progressing   Problem: Coping: Goal: Ability to verbalize frustrations and anger appropriately will improve Outcome: Progressing   Problem: Safety: Goal: Periods of time without injury will increase Outcome: Progressing   Problem: Medication: Goal: Compliance with prescribed medication regimen will improve Outcome: Progressing   Problem: Health Behavior/Discharge Planning: Goal: Ability to identify changes in lifestyle to reduce recurrence of condition will improve Outcome: Progressing Goal: Identification of resources available to assist in meeting health care needs will improve Outcome: Progressing   Problem: Role Relationship: Goal: Will demonstrate positive changes in social behaviors and relationships Outcome: Progressing

## 2019-07-15 NOTE — [facility] Suicide Risk Assessment (Signed)
[name] INPATIENT:  Family/Significant Other Suicide Prevention Education  Suicide Prevention Education:  Education Completed; Randall Parsons, wife, [phone] has been identified by the patient as the family member/significant other with whom the patient will be residing, and identified as the person(s) who will aid the patient in the event of a mental health crisis (suicidal ideations/suicide attempt).  With written consent from the patient, the family member/significant other has been provided the following suicide prevention education, prior to the and/or following the discharge of the patient.  The suicide prevention education provided includes the following:  Suicide risk factors  Suicide prevention and interventions  National Suicide Hotline telephone number  [facility] assessment telephone number  [facility] Emergency Assistance [place] and/or Residential Mobile Crisis Unit telephone number  Request made of family/significant other to:  Remove weapons (e.g., guns, rifles, knives), all items previously/currently identified as safety concern.    Remove drugs/medications (over-the-counter, prescriptions, illicit drugs), all items previously/currently identified as a safety concern.  The family member/significant other verbalizes understanding of the suicide prevention education information provided.  The family member/significant other agrees to remove the items of safety concern listed above.   Pt's wife reports that the patient has been "struggling with substance since he was 11".  Wife reports that patient is not able to return because she has children in the home.  She reports that the patient has been on a binge for several weeks.  CSW asked if wife is aware of the friend that patient alludes to that owns the firearm that he has access to.  Wife reports that she does not.  Wife reports that for patient to return home he has to have 4 months clean, a  job and earning morning consistently so that he can pay his own way and pay her back as well.    Randall Parsons 07/15/2019, 12:52 PM

## 2019-07-15 NOTE — Plan of Care (Signed)
  Problem: Education: Goal: Knowledge of [place] [facility] Education information/materials will improve Outcome: Progressing   Problem: Coping: Goal: Ability to verbalize frustrations and anger appropriately will improve Outcome: Progressing   Problem: Safety: Goal: Periods of time without injury will increase Outcome: Progressing   Problem: Medication: Goal: Compliance with prescribed medication regimen will improve Outcome: Progressing   Problem: Health Behavior/Discharge Planning: Goal: Ability to identify changes in lifestyle to reduce recurrence of condition will improve Outcome: Progressing Goal: Identification of resources available to assist in meeting health care needs will improve Outcome: Progressing   Problem: Role Relationship: Goal: Will demonstrate positive changes in social behaviors and relationships Outcome: Progressing

## 2019-07-15 NOTE — Progress Notes (Signed)
Patient is adjusting well in the unit, and complying with stated care plans and medical regimen, patient reports that he will do all he can to achieve wellness and return to his family that he missed so far. Education is provided , encourage patient participation in scheduled activities, to improve emotional state. Patient denies any SI/HI/AVH and 15 minutes safety check is maintained.

## 2019-07-15 NOTE — Progress Notes (Signed)
[facility] MD Progress Note  07/15/2019 1:30 PM Randall Parsons  MRN:  [id number] Subjective: Follow-up for this patient with alcohol and cocaine abuse.  Patient seen and he met with treatment team today.  Patient presented to the hospital stating that he felt he needed to go to some sort of long-term residential program.  On evaluation today the patient repeats this and discussed it with treatment team.  Mood is feeling a little bit down but not profoundly depressed.  Not reporting suicidal ideation.  Does not appear to be psychotic.  Energy level seems to be recovering to normal and he does not appear to be having problems doing his basic hygiene.  We discussed various options available.  Patient has no interest in going to a long-term 2-year recovery program.  He also has no interest in going to the alcohol and drug abuse treatment Center in [place] which would be only 2 weeks.  He seems unmotivated to return to an [facility].  Unmotivated to go to [facility].  Not interested in going to the [facility].  We are running out of available options. Principal Problem: Alcohol abuse Diagnosis: Principal Problem:   Alcohol abuse Active Problems:   Cocaine abuse ([place])  Total Time spent with patient: 30 minutes  Past Psychiatric History: Long history of substance abuse and mood symptoms  Past Medical History:  Past Medical History:  Diagnosis Date  . CHF (congestive heart failure) ([place])   . Exposure to hepatitis B   . Exposure to hepatitis C   . Hepatitis C   . History of opioid abuse ([place])    reports heroin abuse, ending around 2017  . Hypertension   . IV drug abuse ([place])   . Renal disorder    kidney stones    Past Surgical History:  Procedure Laterality Date  . APPENDECTOMY    . EYE SURGERY     secondary to dog bite   Family History:  Family History  Problem Relation Age of Onset  . Hypertension Father    Family Psychiatric  History: See previous Social History:  Social  History   Substance and Sexual Activity  Alcohol Use No     Social History   Substance and Sexual Activity  Drug Use Not Currently  . Types: IV   Comment: hx of heroin abuse (opioid addiction)    Social History   Socioeconomic History  . Marital status: Single    Spouse name: Not on file  . Number of children: Not on file  . Years of education: Not on file  . Highest education level: Not on file  Occupational History  . Not on file  Social Needs  . Financial resource strain: Not on file  . Food insecurity    Worry: Not on file    Inability: Not on file  . Transportation needs    Medical: Not on file    Non-medical: Not on file  Tobacco Use  . Smoking status: Former Research scientist (life sciences)  . Smokeless tobacco: Never Used  Substance and Sexual Activity  . Alcohol use: No  . Drug use: Not Currently    Types: IV    Comment: hx of heroin abuse (opioid addiction)  . Sexual activity: Not on file  Lifestyle  . Physical activity    Days per week: Not on file    Minutes per session: Not on file  . Stress: Not on file  Relationships  . Social Herbalist on phone:  Not on file    Gets together: Not on file    Attends religious service: Not on file    Active member of club or organization: Not on file    Attends meetings of clubs or organizations: Not on file    Relationship status: Not on file  Other Topics Concern  . Not on file  Social History Narrative  . Not on file   Additional Social History:                         Sleep: Fair  Appetite:  Fair  Current Medications: Current Facility-Administered Medications  Medication Dose Route Frequency Provider Last Rate Last Dose  . acetaminophen (TYLENOL) tablet 650 mg  650 mg Oral Q6H PRN Cristofano, Dorene Ar, MD      . alum & mag hydroxide-simeth (MAALOX/MYLANTA) 200-200-20 MG/5ML suspension 30 mL  30 mL Oral Q4H PRN Cristofano, Paul A, MD      . hydrOXYzine (ATARAX/VISTARIL) tablet 50 mg  50 mg Oral Q6H PRN  Fronnie Urton, Madie Reno, MD   50 mg at 07/15/19 9381  . LORazepam (ATIVAN) injection 0-4 mg  0-4 mg Intravenous Q6H Cristofano, Dorene Ar, MD       Or  . LORazepam (ATIVAN) tablet 0-4 mg  0-4 mg Oral Q6H Cristofano, Dorene Ar, MD   2 mg at 07/15/19 1208  . [START ON 07/16/2019] LORazepam (ATIVAN) injection 0-4 mg  0-4 mg Intravenous Q12H Cristofano, Dorene Ar, MD       Or  . Derrill Memo ON 07/16/2019] LORazepam (ATIVAN) tablet 0-4 mg  0-4 mg Oral Q12H Cristofano, Paul A, MD      . magnesium hydroxide (MILK OF MAGNESIA) suspension 30 mL  30 mL Oral Daily PRN Cristofano, Dorene Ar, MD      . Derrill Memo ON 07/16/2019] multivitamin with minerals tablet 1 tablet  1 tablet Oral Daily Izac Faulkenberry T, MD      . thiamine (VITAMIN B-1) tablet 100 mg  100 mg Oral Daily Cristofano, Dorene Ar, MD   100 mg at 07/15/19 0175   Or  . thiamine (B-1) injection 100 mg  100 mg Intravenous Daily Cristofano, Dorene Ar, MD      . traZODone (DESYREL) tablet 100 mg  100 mg Oral QHS PRN Taliyah Watrous, Madie Reno, MD        Lab Results:  Results for orders placed or performed during the hospital encounter of 07/14/19 (from the past 48 hour(s))  Hepatic function panel     Status: None   Collection Time: 07/15/19  7:05 AM  Result Value Ref Range   Total Protein 7.1 6.5 - 8.1 g/dL   Albumin 3.9 3.5 - 5.0 g/dL   AST 23 15 - 41 U/L   ALT 30 0 - 44 U/L   Alkaline Phosphatase 66 38 - 126 U/L   Total Bilirubin 0.7 0.3 - 1.2 mg/dL   Bilirubin, Direct <0.1 0.0 - 0.2 mg/dL   Indirect Bilirubin NOT CALCULATED 0.3 - 0.9 mg/dL    Comment: Performed at [name], [address] [place], [place] [address]  Lipid panel     Status: Abnormal   Collection Time: 07/15/19  7:05 AM  Result Value Ref Range   Cholesterol 170 0 - 200 mg/dL   Triglycerides 90 <150 mg/dL   HDL 40 (L) >40 mg/dL   Total CHOL/HDL Ratio 4.3 RATIO   VLDL 18 0 - 40 mg/dL   LDL Cholesterol 112 (H)  0 - 99 mg/dL    Comment:        Total Cholesterol/HDL:CHD Risk Coronary Heart Disease Risk  Table                     Men   Women  1/2 Average Risk   3.4   3.3  Average Risk       5.0   4.4  2 X Average Risk   9.6   7.1  3 X Average Risk  23.4   11.0        Use the calculated Patient Ratio above and the CHD Risk Table to determine the patient's CHD Risk.        ATP III CLASSIFICATION (LDL):  <100     mg/dL   Optimal  100-129  mg/dL   Near or Above                    Optimal  130-159  mg/dL   Borderline  160-189  mg/dL   High  >190     mg/dL   Very High Performed at [facility], [address] [place], [place] [address]   TSH     Status: None   Collection Time: 07/15/19  7:05 AM  Result Value Ref Range   TSH 2.494 0.350 - 4.500 uIU/mL    Comment: Performed by a 3rd Generation assay with a functional sensitivity of <=0.01 uIU/mL. Performed at [facility], [address] [place], [place] [address]     Blood Alcohol level:  Lab Results  Component Value Date   [facility] <10 07/10/2019   ETH <10 84/69/6295    Metabolic Disorder Labs: No results found for: HGBA1C, MPG No results found for: PROLACTIN Lab Results  Component Value Date   CHOL 170 07/15/2019   TRIG 90 07/15/2019   HDL 40 (L) 07/15/2019   CHOLHDL 4.3 07/15/2019   VLDL 18 07/15/2019   LDLCALC 112 (H) 07/15/2019    Physical Findings: AIMS:  , ,  ,  ,    CIWA:  CIWA-Ar Total: 10 COWS:     Musculoskeletal: Strength & Muscle Tone: within normal limits Gait & Station: normal Patient leans: N/A  Psychiatric Specialty Exam: Physical Exam  Nursing note and vitals reviewed. Constitutional: He appears well-developed and well-nourished.  HENT:  Head: Normocephalic and atraumatic.  Eyes: Pupils are equal, round, and reactive to light. Conjunctivae are normal.  Neck: Normal range of motion.  Cardiovascular: Regular rhythm and normal heart sounds.  Respiratory: Effort normal.  GI: Soft.  Musculoskeletal: Normal range of motion.  Neurological: He is alert.  Skin: Skin is warm  and dry.  Psychiatric: His speech is normal and behavior is normal. Judgment and thought content normal. Cognition and memory are normal. He exhibits a depressed mood.    Review of Systems  Constitutional: Negative.   HENT: Negative.   Eyes: Negative.   Respiratory: Negative.   Cardiovascular: Negative.   Gastrointestinal: Negative.   Musculoskeletal: Negative.   Skin: Negative.   Neurological: Negative.   Psychiatric/Behavioral: Positive for depression and substance abuse. Negative for hallucinations, memory loss and suicidal ideas. The patient is nervous/anxious. The patient does not have insomnia.     Blood pressure 126/82, pulse 96, temperature (!) 97.5 F (36.4 C), temperature source Oral, resp. rate 18, height '5\' 9"'$  (1.753 m), weight 84.4 kg, SpO2 100 %.Body mass index is 27.47 kg/m.  General Appearance: Fairly Groomed  Eye Contact:  Good  Speech:  Clear and Coherent  Volume:  Normal  Mood:  Euthymic  Affect:  Appropriate  Thought Process:  Goal Directed  Orientation:  Full (Time, Place, and Person)  Thought Content:  Logical  Suicidal Thoughts:  No  Homicidal Thoughts:  No  Memory:  Immediate;   Fair Recent;   Fair Remote;   Fair  Judgement:  Fair  Insight:  Fair  Psychomotor Activity:  Normal  Concentration:  Concentration: Fair  Recall:  [employer] of Knowledge:  Fair  Language:  Fair  Akathisia:  No  Handed:  Right  AIMS (if indicated):     Assets:  Desire for Improvement Housing Physical Health Social Support  ADL's:  Intact  Cognition:  WNL  Sleep:  Number of Hours: 6.75     Treatment Plan Summary: Daily contact with patient to assess and evaluate symptoms and progress in treatment, Medication management and Plan Patient currently with low but reactive mood and new suicidal ideation physical symptoms recovering vital stable.  Patient is looking for some kind of discharge plan that may not realistically exist.  He is going to meet with social work today  and discuss whether there are any other options.  He was asked to see if he or his wife could come up with any sort of plan that fit what he was looking for because we may not have other available options and the patient really does not require longer term hospitalization.  Still no clear indication for psychiatric medicine besides as needed detox.  Alethia Berthold, MD 07/15/2019, 1:30 PM

## 2019-07-15 NOTE — [facility] Group Notes (Signed)
LCSW Group Therapy Note  07/15/2019 12:56 PM  Type of Therapy/Topic:  Group Therapy:  Balance in Life  Participation Level:  Did Not Attend  Description of Group:    This group will address the concept of balance and how it feels and looks when one is unbalanced. Patients will be encouraged to process areas in their lives that are out of balance and identify reasons for remaining unbalanced. Facilitators will guide patients in utilizing problem-solving interventions to address and correct the stressor making their life unbalanced. Understanding and applying boundaries will be explored and addressed for obtaining and maintaining a balanced life. Patients will be encouraged to explore ways to assertively make their unbalanced needs known to significant others in their lives, using other group members and facilitator for support and feedback.  Therapeutic Goals: 1. Patient will identify two or more emotions or situations they have that consume much of in their lives. 2. Patient will identify signs/triggers that life has become out of balance:  3. Patient will identify two ways to set boundaries in order to achieve balance in their lives:  4. Patient will demonstrate ability to communicate their needs through discussion and/or role plays  Summary of Patient Progress: x     Therapeutic Modalities:   Cognitive Behavioral Therapy Solution-Focused Therapy Assertiveness Training  Ollie Esty, MSW, LCSW Clinical Social Work 07/15/2019 12:56 PM   

## 2019-07-15 NOTE — Progress Notes (Signed)
Recreation Therapy Notes  INPATIENT RECREATION THERAPY ASSESSMENT  Patient Details Name: Finlee Milo MRN: [id number] DOB: [birth year] Today's Date: 07/15/2019       Information Obtained From: Patient  Able to Participate in Assessment/Interview: Yes  Patient Presentation: Responsive  Reason for Admission (Per Patient): Active Symptoms, Substance Abuse  Patient Stressors:    Coping Skills:   Substance Abuse, Talk  Leisure Interests (2+):  Exercise - Walking, Exercise - Running, Exercise - Lifting Weights  Frequency of Recreation/Participation: Monthly  Awareness of Community Resources:  Yes  Community Resources:  Church  Current Use:    If no, Barriers?:    Expressed Interest in [employer] Information:    [place] of Residence:  [place]  Patient Main Form of Transportation: Musician  Patient Strengths:  Manufacturing engineer, Hardworking  Patient Identified Areas of Improvement:  My patience  Patient Goal for Hospitalization:  Detox in a safe environmentand get community resources  Current SI (including self-harm):  No  Current HI:  No  Current AVH: No  Staff Intervention Plan: Group Attendance, Collaborate with Interdisciplinary Treatment Team  Consent to Intern Participation: N/A  Raymundo Rout 07/15/2019, 2:53 PM

## 2019-07-15 NOTE — Progress Notes (Signed)
Nutrition Brief Note  Patient identified on the Malnutrition Screening Tool (MST) Report  38 y/o male with h/o kidney stones, substance abuse, depression, hepatitis and CHF    Wt Readings from Last 15 Encounters:  07/14/19 84.4 kg  07/10/19 90.3 kg  04/16/19 88.9 kg  02/22/19 88.9 kg  12/17/18 89.3 kg  09/12/18 88.5 kg  09/12/18 88.5 kg  08/22/18 86.2 kg  07/17/18 88.5 kg  12/26/17 90.7 kg  12/14/17 90.7 kg  11/28/17 85.7 kg  10/03/17 93 kg  09/17/17 92.1 kg  04/12/17 83.9 kg    Body mass index is 27.47 kg/m. Patient meets criteria for normal weight based on current BMI.   Current diet order is regular, patient is consuming approximately 100% of meals at this time. Labs and medications reviewed.   No nutrition interventions warranted at this time. If nutrition issues arise, please consult RD.   Koleen Distance MS, RD, LDN Pager #- [phone] Office#- [phone] After Hours Pager: [phone]
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
## 2019-07-15 NOTE — BHH Counselor (Signed)
CSW reviewed the following treatment options with the patient and he declined them.  -[place], MSW, LCSW 07/15/2019 12:48 PM

## 2019-07-16 MED ORDER — LORAZEPAM 2 MG PO TABS
0.0000 mg | ORAL_TABLET | Freq: Two times a day (BID) | ORAL | Status: DC
  Administered 2019-07-16: 11:00:00 2 mg via ORAL
  Filled 2019-07-16: qty 1

## 2019-07-16 MED ORDER — LORAZEPAM 2 MG/ML IJ SOLN: 0.0000 mg | Freq: Two times a day (BID) | INTRAMUSCULAR | Status: DC

## 2019-07-16 MED ORDER — PANTOPRAZOLE SODIUM 40 MG PO TBEC: 40.0000 mg | DELAYED_RELEASE_TABLET | Freq: Every day | ORAL | 2 refills | Status: DC

## 2019-07-16 MED ORDER — GABAPENTIN 300 MG PO CAPS: 300.0000 mg | ORAL_CAPSULE | Freq: Three times a day (TID) | ORAL | 2 refills | Status: DC

## 2019-07-16 NOTE — Progress Notes (Signed)
  [facility] Adult Case Management Discharge Plan :  Will you be returning to the same living situation after discharge:  No. At discharge, do you have transportation home?: Yes,  wife will provide transportation. Do you have the ability to pay for your medications: No.  Release of information consent forms completed and in the chart;  Patient's signature needed at discharge.  Patient to Follow up at: Follow-up Information    [facility] Follow up.   Why: Per your wife, they are expecting you between 67 and 1, 07/16/2019. Contact information: [address], [place] [address] [phone]          Next level of care provider has access to [place] and Suicide Prevention discussed: Yes,  SPE completed with wife.  Have you used any form of tobacco in the last 30 days? (Cigarettes, Smokeless Tobacco, Cigars, and/or Pipes): Yes  Has patient been referred to the Quitline?: Patient refused referral  Patient has been referred for addiction treatment: Yes  Rozann Lesches, LCSW 07/16/2019, 10:40 AM

## 2019-07-16 NOTE — Progress Notes (Signed)
Recreation Therapy Notes  INPATIENT RECREATION THERAPY ASSESSMENT  Patient Details Name: Randall Parsons MRN: [id number] DOB: Aug 15, 1981 Today's Date: 07/16/2019       Information Obtained From: Patient  Able to Participate in Assessment/Interview: Yes  Patient Presentation: Responsive  Reason for Admission (Per Patient): Active Symptoms, Substance Abuse  Patient Stressors:    Coping Skills:   Substance Abuse, Talk  Leisure Interests (2+):  Exercise - Walking, Exercise - Running, Exercise - Lifting Weights  Frequency of Recreation/Participation: Monthly  Awareness of Community Resources:  Yes  Community Resources:  Church  Current Use:    If no, Barriers?:    Expressed Interest in [employer] Information:    [place] of Residence:  [place]  Patient Main Form of Transportation: Musician  Patient Strengths:  Manufacturing engineer, Hardworking  Patient Identified Areas of Improvement:  My patience  Patient Goal for Hospitalization:  Detox in a safe environmentand get community resources  Current SI (including self-harm):  No  Current HI:  No  Current AVH: No  Staff Intervention Plan: Group Attendance, Collaborate with Interdisciplinary Treatment Team  Consent to Intern Participation: N/A  Randall Parsons  [place] 07/16/2019, 1:39 PM

## 2019-07-16 NOTE — Plan of Care (Signed)
Pt rates depression 7/10 and anxiety 3/10. Pt denies SI, HI and AVH. Pt was educated on care plan and verbalizes understanding. Collier Bullock RN Problem: Education: Goal: Knowledge of [facility] Education information/materials will improve Outcome: Adequate for Discharge   Problem: Coping: Goal: Ability to verbalize frustrations and anger appropriately will improve Outcome: Adequate for Discharge   Problem: Safety: Goal: Periods of time without injury will increase Outcome: Adequate for Discharge   Problem: Medication: Goal: Compliance with prescribed medication regimen will improve Outcome: Adequate for Discharge   Problem: Health Behavior/Discharge Planning: Goal: Ability to identify changes in lifestyle to reduce recurrence of condition will improve Outcome: Adequate for Discharge Goal: Identification of resources available to assist in meeting health care needs will improve Outcome: Adequate for Discharge   Problem: Role Relationship: Goal: Will demonstrate positive changes in social behaviors and relationships Outcome: Adequate for Discharge

## 2019-07-16 NOTE — Progress Notes (Signed)
Patient alert and oriented x 4, affect is blunted, he denies SI/HI/AVH,  he is assertive, he was noted anxious, restless, and  intrusive, he was at the nursing station asking nurses for medication before scheduled time. Patient is on CIWA every 6 hours, he was noted anxious and restless, no tremors, sweating or other symptoms during this shift. Patient was noted in the dayroom interacting with peers and staff, appropriately, took his nigh time medication. 15 minutes safety checks maintained will continue to monitor.

## 2019-07-16 NOTE — Progress Notes (Signed)
Recreation Therapy Notes  INPATIENT RECREATION TR PLAN  Patient Details Name: Randall Parsons MRN: [id number] DOB: 10/14/1980 Today's Date: 07/16/2019  Rec Therapy Plan Is patient appropriate for Therapeutic Recreation?: Yes Treatment times per week: at least 3 Estimated Length of Stay: 5-7 days TR Treatment/Interventions: Group participation (Comment)  Discharge Criteria Pt will be discharged from therapy if:: Discharged Treatment plan/goals/alternatives discussed and agreed upon by:: Patient/family  Discharge Summary Short term goals set: Patient will engage in groups without prompting or encouragement from LRT x3 group sessions within 5 recreation therapy group sessions Short term goals met: Not met Reason goals not met: Patient did not attend any groups Therapeutic equipment acquired: N/A Reason patient discharged from therapy: Discharge from hospital Pt/family agrees with progress & goals achieved: Yes Date patient discharged from therapy: 07/16/19   Bristol Osentoski 07/16/2019, 1:40 PM

## 2019-07-16 NOTE — Plan of Care (Signed)
  Problem: Coping: Goal: Ability to verbalize frustrations and anger appropriately will improve Outcome: Progressing  Patient verbalized his frustration with drug and alcohol addiction and how he wants to find an appropriate treatment facility.

## 2019-07-16 NOTE — Progress Notes (Addendum)
Pt denies depression, anxiety, SI, HI and AVH. Pt was educated on d/c plan and verbalizes understanding. Pt received transition packet, prescriptions, belongings including sealed packet with pocket knife, cell phone and wallet. Collier Bullock RN

## 2019-07-16 NOTE — Progress Notes (Signed)
Recreation Therapy Notes   Date: 07/16/2019  Time: 9:30 am   Location: Craft room   Behavioral response: N/A   Intervention Topic: Emotions  Discussion/Intervention: Patient did not attend group.   Clinical Observations/Feedback:  Patient did not attend group.   Vanette Noguchi LRT/CTRS        Sophya Vanblarcom 07/16/2019 10:58 AM

## 2019-07-16 NOTE — BHH Suicide Risk Assessment (Signed)
[facility] Discharge Suicide Risk Assessment   Principal Problem: Alcohol abuse Discharge Diagnoses: Principal Problem:   Alcohol abuse Active Problems:   Cocaine abuse ([place])   Total Time spent with patient: 30 minutes  Musculoskeletal: Strength & Muscle Tone: within normal limits Gait & Station: normal Patient leans: N/A  Psychiatric Specialty Exam: Review of Systems  Constitutional: Negative.   HENT: Negative.   Eyes: Negative.   Respiratory: Negative.   Cardiovascular: Negative.   Gastrointestinal: Negative.   Musculoskeletal: Negative.   Skin: Negative.   Neurological: Negative.   Psychiatric/Behavioral: Negative.     Blood pressure 109/89, pulse 81, temperature 98 F (36.7 C), temperature source Oral, resp. rate 17, height 5\' 9"  (1.753 m), weight 84.4 kg, SpO2 100 %.Body mass index is 27.47 kg/m.  General Appearance: Fairly Groomed  Engineer, water::  Good  Speech:  Clear and [id number]  Volume:  Normal  Mood:  Euthymic  Affect:  Congruent  Thought Process:  Goal Directed  Orientation:  Full (Time, Place, and Person)  Thought Content:  Logical  Suicidal Thoughts:  No  Homicidal Thoughts:  No  Memory:  Immediate;   Fair Recent;   Fair Remote;   Fair  Judgement:  Fair  Insight:  Fair  Psychomotor Activity:  Normal  Concentration:  Fair  Recall:  [employer] of Knowledge:Fair  Language: Fair  Akathisia:  No  Handed:  Right  AIMS (if indicated):     Assets:  Desire for Improvement Physical Health Social Support  Sleep:  Number of Hours: 6.75  Cognition: WNL  ADL's:  Intact   Mental Status Per Nursing Assessment::   On Admission:  Suicidal ideation indicated by others  Demographic Factors:  Male and Caucasian  Loss Factors: Loss of significant relationship  Historical Factors: Impulsivity  Risk Reduction Factors:   Responsible for children under 28 years of age, Sense of responsibility to family, Religious beliefs about death and Positive social  support  Continued Clinical Symptoms:  Alcohol/Substance Abuse/Dependencies  Cognitive Features That Contribute To Risk:  None    Suicide Risk:  Minimal: No identifiable suicidal ideation.  Patients presenting with no risk factors but with morbid ruminations; may be classified as minimal risk based on the severity of the depressive symptoms    Plan Of Care/Follow-up recommendations:  Activity:  as tolerated Diet:  regular Other:  [facility] with focus on maintaining sobriety  Alethia Berthold, MD 07/16/2019, 9:58 AM

## 2019-07-16 NOTE — Plan of Care (Signed)
  Problem: Group Participation Goal: STG - Patient will engage in groups without prompting or encouragement from LRT x3 group sessions within 5 recreation therapy group sessions Description: STG - Patient will engage in groups without prompting or encouragement from LRT x3 group sessions within 5 recreation therapy group sessions. Patient did not attend any groups Outcome: Not Met (add Reason)

## 2019-07-16 NOTE — Discharge Summary (Signed)
Physician Discharge Summary Note  Patient:  Randall Parsons is an 38 y.o., male MRN:  [id number] DOB:  [birth year] Patient phone:  [phone] (home)  Patient address:   [address] [place] [address],  Total Time spent with patient: 45 minutes  Date of Admission:  07/14/2019 Date of Discharge: 07/16/19  Reason for Admission:  Patient referred to [place] from [facility] where he presented with alcohol and cocaine abuse and mood symptoms.  Patient reports that he relapsed into alcohol and cocaine abuse this past Friday and has been drinking and using crack cocaine heavily since then.  Claims to be drinking over a case of beer a day with his last drink being yesterday afternoon.  Says that his mood is been down and sad anxious.  He only has suicidal thoughts when he feels like he is not going to get the help that he wants.  He has no actual intention or plan of killing himself.  Prior to relapse on Friday he says his mood had been stable.  Denies any symptoms of depression leading up to that.  Has no particular reason for having relapsed.  Patient is not on any psychiatric medicine.  Had been attending celebrate recovery meetings as part of his sobriety.  Since relapsing this last week his wife has forbidden him to come back home.  Patient is now stating that he needs to get into "a long-term recovery".  Denies any auditory or visual hallucinations.  Denies any homicidal thoughts.  Principal Problem: Alcohol abuse Discharge Diagnoses: Principal Problem:   Alcohol abuse Active Problems:   Cocaine abuse ([place])   Past Psychiatric History: Patient has a long history of alcohol and drug abuse.  He denies any history of withdrawal seizures.  He used the term "DTs" to describe his alcohol withdrawal but what he is describing is symptomatic tremulousness and nausea but not true delirium and I do not think I see any evidence in his old chart of delirium.  Patient has presented with symptoms similar to this  many times in the past and has been prescribed antidepressants.  He tells me he does not think antidepressants were ever of any lasting benefit and in any case he was not having depressive symptoms leading up to his relapse.  Only suicide attempt was a wrist cutting as a teenager and none since then.  As the patient himself says "he is "programmed out" meaning that he has attended such a large number of rehab programs that he is not sure where he could still go at this point.  Past Medical History:  Past Medical History:  Diagnosis Date  . CHF (congestive heart failure) ([place])   . Exposure to hepatitis B   . Exposure to hepatitis C   . Hepatitis C   . History of opioid abuse ([place])    reports heroin abuse, ending around 2017  . Hypertension   . IV drug abuse ([place])   . Renal disorder    kidney stones    Past Surgical History:  Procedure Laterality Date  . APPENDECTOMY    . EYE SURGERY     secondary to dog bite   Family History:  Family History  Problem Relation Age of Onset  . Hypertension Father    Family Psychiatric  History: Positive for substance abuse and bipolar disorder Social History:  Social History   Substance and Sexual Activity  Alcohol Use No     Social History   Substance and Sexual Activity  Drug  Use Not Currently  . Types: IV   Comment: hx of heroin abuse (opioid addiction)    Social History   Socioeconomic History  . Marital status: Single    Spouse name: Not on file  . Number of children: Not on file  . Years of education: Not on file  . Highest education level: Not on file  Occupational History  . Not on file  Social Needs  . Financial resource strain: Not on file  . Food insecurity    Worry: Not on file    Inability: Not on file  . Transportation needs    Medical: Not on file    Non-medical: Not on file  Tobacco Use  . Smoking status: Former Games developer  . Smokeless tobacco: Never Used  Substance and Sexual Activity  . Alcohol use: No  .  Drug use: Not Currently    Types: IV    Comment: hx of heroin abuse (opioid addiction)  . Sexual activity: Not on file  Lifestyle  . Physical activity    Days per week: Not on file    Minutes per session: Not on file  . Stress: Not on file  Relationships  . Social Musician on phone: Not on file    Gets together: Not on file    Attends religious service: Not on file    Active member of club or organization: Not on file    Attends meetings of clubs or organizations: Not on file    Relationship status: Not on file  Other Topics Concern  . Not on file  Social History Narrative  . Not on file    Hospital Course: Patient is a 38 year old male that presented to the hospital for admission from [facility] ED where he presented with alcohol and cocaine abuse and mood disorder symptoms.  Patient presented to the hospital reporting having suicidal thoughts due to his substance abuse but has no actual intent or plan of killing himself.  Patient remained in the hospital for 1 day and was started on gabapentin 300 mg p.o. 3 times daily as well as a Ativan detox protocol.  Patient did not attend any groups while he was admitted.  However patient was denying any suicidal or homicidal ideations and denied any hallucinations.  Patient's wife was very supportive and assisted in finding the patient placement at [facility] rescue mission.  She was requesting patient to be discharged today and she establish a safety plan with CSW.  Patient discharged to his wife which is arrange for transportation to take him to the rescue mission.  During his stay at the hospital patient did not show much interaction and per chart review patient's main reason for being in the hospital was due to his substance abuse.  Patient remained on the [facility] unit for 1 days. The patient stabilized on medication and therapy. Patient was discharged on Gabapentin 300 mg PO TID. Patient has shown improvement with improved mood, affect,  sleep, appetite, and interaction. Patient has attended group and participated. Patient has been seen in the day room interacting with peers and staff appropriately. Patient denies any SI/HI/AVH and contracts for safety. Patient agrees to follow up at [facility]. Patient is provided with prescriptions for their medications upon discharge.   Physical Findings: AIMS:  , ,  ,  ,    CIWA:  CIWA-Ar Total: 11 COWS:     Musculoskeletal: Strength & Muscle Tone: within normal limits Gait & Station: normal Patient leans:  N/A  Psychiatric Specialty Exam: Physical Exam  Nursing note and vitals reviewed. Constitutional: He is oriented to person, place, and time. He appears well-developed and well-nourished.  Cardiovascular: Normal rate.  Respiratory: Effort normal.  Musculoskeletal: Normal range of motion.  Neurological: [name] to person, place, and time.  Skin: Skin is warm.    Review of Systems  Constitutional: Negative.   HENT: Negative.   Eyes: Negative.   Respiratory: Negative.   Cardiovascular: Negative.   Gastrointestinal: Negative.   Genitourinary: Negative.   Musculoskeletal: Negative.   Skin: Negative.   Neurological: Negative.   Endo/Heme/Allergies: Negative.   Psychiatric/Behavioral: Positive for substance abuse.    Blood pressure 134/81, pulse (!) 101, temperature 98 F (36.7 C), temperature source Oral, resp. rate 17, height 5\' 9"  (1.753 m), weight 84.4 kg, SpO2 100 %.Body mass index is 27.47 kg/m.  General Appearance: Casual  Eye Contact:  Good  Speech:  Clear and Coherent and Normal Rate  Volume:  Normal  Mood:  Euthymic  Affect:  Congruent  Thought Process:  Coherent and Descriptions of Associations: Intact  Orientation:  Full (Time, Place, and Person)  Thought Content:  WDL  Suicidal Thoughts:  No  Homicidal Thoughts:  No  Memory:  Immediate;   Fair Recent;   Fair Remote;   Fair  Judgement:  Fair  Insight:  Fair  Psychomotor  Activity:  Normal  Concentration:  Concentration: Good  Recall:  Good  Fund of Knowledge:  Good  Language:  Good  Akathisia:  No  Handed:  Right  AIMS (if indicated):     Assets:  Communication Skills Desire for Improvement Housing Resilience Social Support Transportation  ADL's:  Intact  Cognition:  WNL  Sleep:  Number of Hours: 6.75     Have you used any form of tobacco in the last 30 days? (Cigarettes, Smokeless Tobacco, Cigars, and/or Pipes): Yes  Has this patient used any form of tobacco in the last 30 days? (Cigarettes, Smokeless Tobacco, Cigars, and/or Pipes) Yes, Yes, A prescription for an FDA-approved tobacco cessation medication was offered at discharge and the patient refused  Blood Alcohol level:  Lab Results  Component Value Date   ETH <10 07/10/2019   ETH <10 08/22/2018    Metabolic Disorder Labs:  No results found for: HGBA1C, MPG No results found for: PROLACTIN Lab Results  Component Value Date   CHOL 170 07/15/2019   TRIG 90 07/15/2019   HDL 40 (L) 07/15/2019   CHOLHDL 4.3 07/15/2019   VLDL 18 07/15/2019   LDLCALC 112 (H) 07/15/2019    See Psychiatric Specialty Exam and Suicide Risk Assessment completed by Attending Physician prior to discharge.  Discharge destination:  Home  Is patient on multiple antipsychotic therapies at discharge:  No   Has Patient had three or more failed trials of antipsychotic monotherapy by history:  No  Recommended Plan for Multiple Antipsychotic Therapies: NA  Discharge Instructions    Diet - low sodium heart healthy   Complete by: As directed    Increase activity slowly   Complete by: As directed      Allergies as of 07/16/2019      Reactions   Amoxicillin Hives   Did it involve swelling of the face/tongue/throat, SOB, or low BP? No Did it involve sudden or severe rash/hives, skin peeling, or any reaction on the inside of your mouth or nose? Yes Did you need to seek medical attention at a hospital or  doctor's office? Yes  When did it last happen?several years If all above answers are "NO", may proceed with cephalosporin use.      Medication List    STOP taking these medications   b complex vitamins tablet     TAKE these medications     Indication  gabapentin 300 MG capsule Commonly known as: NEURONTIN Take 1 capsule (300 mg total) by mouth 3 (three) times daily.  Indication: Abuse or Misuse of Alcohol   pantoprazole 40 MG tablet Commonly known as: PROTONIX Take 1 tablet (40 mg total) by mouth daily. Start taking on: July 17, 2019  Indication: Gastroesophageal Reflux Disease      Follow-up Information    [facility] Follow up.   Why: Per your wife, they are expecting you between 8 and 2, 07/16/2019. Contact information: [address] [place], [place] [address] [phone]          Follow-up recommendations:  Continue activity as tolerated. Continue diet as recommended by your PCP. Ensure to keep all appointments with outpatient providers.  Comments:  Patient is instructed prior to discharge to: Take all medications as prescribed by his/her mental healthcare provider. Report any adverse effects and or reactions from the medicines to his/her outpatient provider promptly. Patient has been instructed & cautioned: To not engage in alcohol and or illegal drug use while on prescription medicines. In the event of worsening symptoms, patient is instructed to call the crisis hotline, 911 and or go to the nearest ED for appropriate evaluation and treatment of symptoms. To follow-up with his/her primary care provider for your other medical issues, concerns and or health care needs.    Signed: Gerlene Burdock Veer Elamin, FNP 07/16/2019, 11:56 AM

## 2019-08-03 ENCOUNTER — Encounter (HOSPITAL_COMMUNITY): Payer: Self-pay

## 2019-08-03 ENCOUNTER — Ambulatory Visit (HOSPITAL_COMMUNITY)
Admission: EM | Admit: 2019-08-03 | Discharge: 2019-08-03 | Disposition: A | Discharge status: Home / Self Care | Payer: Medicaid Other | Attending: Family Medicine | Admitting: Family Medicine

## 2019-08-03 DIAGNOSIS — Z760 Encounter for issue of repeat prescription: Secondary | ICD-10-CM | POA: Diagnosis not present

## 2019-08-03 DIAGNOSIS — Z20828 Contact with and (suspected) exposure to other viral communicable diseases: Secondary | ICD-10-CM | POA: Insufficient documentation

## 2019-08-03 DIAGNOSIS — Z20822 Contact with and (suspected) exposure to covid-19: Secondary | ICD-10-CM

## 2019-08-03 MED ORDER — CYCLOBENZAPRINE HCL 10 MG PO TABS: 10.0000 mg | ORAL_TABLET | Freq: Two times a day (BID) | ORAL | 2 refills | Status: DC | PRN

## 2019-08-03 MED ORDER — GABAPENTIN 300 MG PO CAPS: 300.0000 mg | ORAL_CAPSULE | Freq: Three times a day (TID) | ORAL | 2 refills | Status: DC

## 2019-08-03 NOTE — Discharge Instructions (Signed)
Your coronavirus test will be sent to laboratory.  You must quarantine yourself until your test result is available. We call all positive tests.  Negative test can be looked up online or by telephone to the office I am refilling her medicines for 1 month with 2 refills. Still recommend that you have a PCP for your ongoing medical needs You need to repeat your coronavirus test if you develop significant symptoms anytime in the next 14 days

## 2019-08-03 NOTE — ED Triage Notes (Signed)
Pt presents top the UC for a Covid test. Pt reports his 40 y/o son tested positive for Covid yesterday. Pt denies any signs and symptoms.  Pt reports he needs gabapentin 300 mg refill and Flexeril refill.

## 2019-08-03 NOTE — ED Provider Notes (Signed)
[facility]    CSN: [id number] Arrival date & time: 08/03/19  1826      History   Chief Complaint Chief Complaint  Patient presents with  . covid exposure  . Medication Refill    HPI Randall Parsons is a 38 y.o. male.   HPI  Patient is requesting refills of his gabapentin and Flexeril.  Gabapentin 300 mg 3 times daily.  Flexeril 10 mg twice daily or once a day. He states that his 59-year-old son is positive for coronavirus.  The test result over the weekend.  The son was tested because he was exposed.  He is absolutely asymptomatic.  He is here today to get a Covid test.  This patient is asymptomatic as well.  No fever.  No cough.  No recent illness.  He is only getting tested because his son is positive.  He is informed that if this test is negative and that he does develop symptoms, he may need to be retested because he is being tested early  Past Medical History:  Diagnosis Date  . CHF (congestive heart failure) ([facility])   . Exposure to hepatitis B   . Exposure to hepatitis C   . Hepatitis C   . 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, ending around 2017  . Hypertension   . IV drug abuse ([facility])   . Renal disorder    kidney stones    Patient Active Problem List   Diagnosis Date Noted  . Major depressive disorder 07/14/2019  . Alcohol abuse 07/14/2019  . 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 Cocaine abuse ([facility]) 07/12/2019  . Chest pain 11/28/2017  . Essential hypertension 11/28/2017  . Low back pain 11/28/2017  . NICM (nonischemic cardiomyopathy) ([facility]) 11/28/2017  . Nephrolithiasis 11/28/2017    Past Surgical History:  Procedure Laterality Date  . APPENDECTOMY    . EYE SURGERY     secondary to dog bite       Home Medications    Prior to Admission medications   Medication Sig Start Date End Date Taking? Authorizing Provider  cyclobenzaprine (FLEXERIL) 10 MG tablet Take 1 tablet (10 mg total) by mouth 2 (two) times daily as  needed for muscle spasms. 08/03/19   Eustace Moore, MD  gabapentin (NEURONTIN) 300 MG capsule Take 1 capsule (300 mg total) by mouth 3 (three) times daily. 08/03/19   Eustace Moore, MD  pantoprazole (PROTONIX) 40 MG tablet Take 1 tablet (40 mg total) by mouth daily. 07/17/19 08/03/19  Clapacs, Jackquline Denmark, MD    Family History Family History  Problem Relation Age of Onset  . Hypertension Father     Social History Social History   Tobacco Use  . Smoking status: Former Games developer  . Smokeless tobacco: Never Used  Substance Use Topics  . Alcohol use: No  . Drug use: Not Currently    Types: IV    Comment: hx of heroin abuse (opioid addiction)     Allergies   Amoxicillin   Review of Systems Review of Systems  Constitutional: Negative for chills and fever.  HENT: Negative for ear pain and sore throat.   Eyes: Negative for pain and visual disturbance.  Respiratory: Negative for cough and shortness of breath.   Cardiovascular: Negative for chest pain and palpitations.  Gastrointestinal: Negative for abdominal pain and vomiting.  Genitourinary: Negative for dysuria and hematuria.  Musculoskeletal: Negative for arthralgias, back pain and myalgias.  Skin: Negative for color  change and rash.  Neurological: Negative for seizures, syncope and headaches.  All other systems reviewed and are negative.    Physical Exam Triage Vital Signs ED Triage Vitals  Enc Vitals Group     BP 08/03/19 1911 127/79     Pulse Rate 08/03/19 1911 83     Resp 08/03/19 1911 16     Temp 08/03/19 1911 98.8 F (37.1 C)     Temp Source 08/03/19 1911 Oral     SpO2 08/03/19 1911 99 %     Weight --      Height --      Head Circumference --      Peak Flow --      Pain Score 08/03/19 1908 8     Pain Loc --      Pain Edu? --      Excl. in GC? --    No data found.  Updated Vital Signs BP 127/79 (BP Location: Right Arm)   Pulse 83   Temp 98.8 F (37.1 C) (Oral)   Resp 16   SpO2 99%        Physical Exam Constitutional:      General: He is not in acute distress.    Appearance: He is well-developed.  HENT:     Head: Normocephalic and atraumatic.  Eyes:     Conjunctiva/sclera: Conjunctivae normal.     Pupils: Pupils are equal, round, and reactive to light.  Neck:     Musculoskeletal: Normal range of motion and neck supple.  Cardiovascular:     Rate and Rhythm: Normal rate.     Heart sounds: Normal heart sounds.  Pulmonary:     Effort: Pulmonary effort is normal. No respiratory distress.  Abdominal:     General: There is no distension.     Palpations: Abdomen is soft.  Musculoskeletal: Normal range of motion.  Skin:    General: Skin is warm and dry.  Neurological:     Mental Status: He is alert.  Psychiatric:        Mood and Affect: Mood normal.        Behavior: Behavior normal.      UC Treatments / Results  Labs (all labs ordered are listed, but only abnormal results are displayed) Labs Reviewed  NOVEL CORONAVIRUS, NAA (HOSP ORDER, SEND-OUT TO REF LAB; TAT 18-24 HRS)    EKG   Radiology No results found.  Procedures Procedures (including critical care time)  Medications Ordered in UC Medications - No data to display  Initial Impression / Assessment and Plan / UC Course  I have reviewed the triage vital signs and the nursing notes.  Pertinent labs & imaging results that were available during my care of the patient were reviewed by me and considered in my medical decision making (see chart for details).     Discussed PCP.  Discussed coronavirus Final Clinical Impressions(s) / UC Diagnoses   Final diagnoses:  None     Discharge Instructions     Your coronavirus test will be sent to laboratory.  You must quarantine yourself until your test result is available. We call all positive tests.  Negative test can be looked up online or by telephone to the office I am refilling her medicines for 1 month with 2 refills. Still recommend that you  have a PCP for your ongoing medical needs You need to repeat your coronavirus test if you develop significant symptoms anytime in the next 14 days   ED Prescriptions  Medication Sig Dispense Auth. Provider   gabapentin (NEURONTIN) 300 MG capsule Take 1 capsule (300 mg total) by mouth 3 (three) times daily. 90 capsule Raylene Everts, MD   cyclobenzaprine (FLEXERIL) 10 MG tablet Take 1 tablet (10 mg total) by mouth 2 (two) times daily as needed for muscle spasms. 60 tablet Raylene Everts, MD     PDMP not reviewed this encounter.   Raylene Everts, MD 08/03/19 [phone]

## 2019-08-06 LAB — NOVEL CORONAVIRUS, NAA (HOSP ORDER, SEND-OUT TO REF LAB; TAT 18-24 HRS): SARS-CoV-2, NAA: NOT DETECTED

## 2019-08-28 ENCOUNTER — Other Ambulatory Visit: Payer: Self-pay

## 2019-08-28 ENCOUNTER — Emergency Department (HOSPITAL_COMMUNITY): Payer: Medicaid Other

## 2019-08-28 ENCOUNTER — Emergency Department (HOSPITAL_COMMUNITY)
Admission: EM | Admit: 2019-08-28 | Discharge: 2019-08-28 | Disposition: A | Discharge status: Home / Self Care | Payer: Medicaid Other | Attending: Emergency Medicine | Admitting: Emergency Medicine

## 2019-08-28 ENCOUNTER — Encounter (HOSPITAL_COMMUNITY): Payer: Self-pay | Admitting: Emergency Medicine

## 2019-08-28 DIAGNOSIS — F1010 Alcohol abuse, uncomplicated: Secondary | ICD-10-CM | POA: Insufficient documentation

## 2019-08-28 DIAGNOSIS — Z87891 Personal history of nicotine dependence: Secondary | ICD-10-CM | POA: Insufficient documentation

## 2019-08-28 DIAGNOSIS — I110 Hypertensive heart disease with heart failure: Secondary | ICD-10-CM | POA: Insufficient documentation

## 2019-08-28 DIAGNOSIS — R079 Chest pain, unspecified: Secondary | ICD-10-CM

## 2019-08-28 DIAGNOSIS — I509 Heart failure, unspecified: Secondary | ICD-10-CM | POA: Insufficient documentation

## 2019-08-28 DIAGNOSIS — R0602 Shortness of breath: Secondary | ICD-10-CM | POA: Insufficient documentation

## 2019-08-28 DIAGNOSIS — R45851 Suicidal ideations: Secondary | ICD-10-CM | POA: Insufficient documentation

## 2019-08-28 DIAGNOSIS — Z79899 Other long term (current) drug therapy: Secondary | ICD-10-CM | POA: Insufficient documentation

## 2019-08-28 DIAGNOSIS — F1490 Cocaine use, unspecified, uncomplicated: Secondary | ICD-10-CM | POA: Insufficient documentation

## 2019-08-28 LAB — BASIC METABOLIC PANEL
Anion gap: 14 (ref 5–15)
BUN: 12 mg/dL (ref 6–20)
CO2: 22 mmol/L (ref 22–32)
Calcium: 9.3 mg/dL (ref 8.9–10.3)
Chloride: 101 mmol/L (ref 98–111)
Creatinine, Ser: 1.26 mg/dL — ABNORMAL HIGH (ref 0.61–1.24)
GFR calc Af Amer: >60 mL/min (ref >60)
GFR calc non Af Amer: >60 mL/min (ref >60)
Glucose, Bld: 94 mg/dL (ref 70–99)
Potassium: 4.1 mmol/L (ref 3.5–5.1)
Sodium: 137 mmol/L (ref 135–145)

## 2019-08-28 LAB — HEPATIC FUNCTION PANEL
ALT: 52 U/L — ABNORMAL HIGH (ref 0–44)
AST: 46 U/L — ABNORMAL HIGH (ref 15–41)
Albumin: 4.4 g/dL (ref 3.5–5.0)
Alkaline Phosphatase: 84 U/L (ref 38–126)
Bilirubin, Direct: 0.1 mg/dL (ref 0.0–0.2)
Indirect Bilirubin: 1.2 mg/dL — ABNORMAL HIGH (ref 0.3–0.9)
Total Bilirubin: 1.3 mg/dL — ABNORMAL HIGH (ref 0.3–1.2)
Total Protein: 7.8 g/dL (ref 6.5–8.1)

## 2019-08-28 LAB — CBC
HCT: 45.8 % (ref 39.0–52.0)
Hemoglobin: 15.3 g/dL (ref 13.0–17.0)
MCH: 30.7 pg (ref 26.0–34.0)
MCHC: 33.4 g/dL (ref 30.0–36.0)
MCV: 91.8 fL (ref 80.0–100.0)
Platelets: 307 10*3/uL (ref 150–400)
RBC: 4.99 MIL/uL (ref 4.22–5.81)
RDW: 13.8 % (ref 11.5–15.5)
WBC: 7.5 10*3/uL (ref 4.0–10.5)
nRBC: 0 % (ref 0.0–0.2)

## 2019-08-28 LAB — TROPONIN I (HIGH SENSITIVITY)
Troponin I (High Sensitivity): 5 ng/L (ref <18)
Troponin I (High Sensitivity): 5 ng/L (ref <18)

## 2019-08-28 MED ORDER — ALUM & MAG HYDROXIDE-SIMETH 200-200-20 MG/5ML PO SUSP
30.0000 mL | Freq: Once | ORAL | Status: AC
  Administered 2019-08-28: 30 mL via ORAL
  Filled 2019-08-28: qty 30

## 2019-08-28 MED ORDER — SODIUM CHLORIDE 0.9 % IV BOLUS
500.0000 mL | Freq: Once | INTRAVENOUS | Status: AC
  Administered 2019-08-28: 500 mL via INTRAVENOUS

## 2019-08-28 MED ORDER — KETOROLAC TROMETHAMINE 15 MG/ML IJ SOLN
15.0000 mg | Freq: Once | INTRAMUSCULAR | Status: AC
  Administered 2019-08-28: 15 mg via INTRAMUSCULAR
  Filled 2019-08-28: qty 1

## 2019-08-28 MED ORDER — LIDOCAINE VISCOUS HCL 2 % MT SOLN
15.0000 mL | Freq: Once | OROMUCOSAL | Status: AC
  Administered 2019-08-28: 15 mL via ORAL
  Filled 2019-08-28: qty 15

## 2019-08-28 MED ORDER — SODIUM CHLORIDE 0.9% FLUSH
3.0000 mL | Freq: Once | INTRAVENOUS | Status: AC
  Administered 2019-08-28: 3 mL via INTRAVENOUS

## 2019-08-28 MED ORDER — ACETAMINOPHEN 500 MG PO TABS
1000.0000 mg | ORAL_TABLET | Freq: Once | ORAL | Status: AC
  Administered 2019-08-28: 1000 mg via ORAL
  Filled 2019-08-28: qty 2

## 2019-08-28 NOTE — ED Notes (Signed)
Patient verbalizes understanding of discharge instructions. Opportunity for questioning and answers were provided. Armband removed by staff. Patient discharged from ED.  

## 2019-08-28 NOTE — ED Provider Notes (Signed)
[facility] EMERGENCY DEPARTMENT Provider Note   CSN: [id number] Arrival date & time: 08/28/19  2035     History   Chief Complaint Chief Complaint  Patient presents with  . Chest Pain    HPI Randall Parsons is a 38 y.o. male presenting for evaluation of CP and SOB.   Pt states he has a h/o crack cocaine use and etoh use.  Patient states around 2 AM he developed acute onset chest pain which she describes as a tightness and shortness of breath.  He was already awake at this time, was not awoken from sleep.  He states symptoms have been persistent, although waxing and waning in severity.  He last used crack cocaine yesterday.  He drank all day yesterday, stopped around midnight.  He has been seen in the ED multiple times for this in the past year.  Patient states he has been told that he has had heart attacks before, but cannot state whether that was a STEMI, NSTEMI, due to cocaine use, or if he had had a cath.  He denies recent fevers, chills, sore throat, nasal congestion, cough, nausea, vomiting, abdominal pain, urinary symptoms, abnormal bowel movements.  He takes no medications daily.  Additional history obtained from chart review.  Patient with a history of heart failure (? If true, not on diuretics, last echo 60-65), hep C, polysubstance abuse, depression, alcohol abuse, hypertension, nonischemic cardiomyopathy.  Patient states because of his drug use, he has intermittent thoughts of suicidality.  Thoughts have been improving recently, he does not have a plan as to how he wants to hurt himself.  He was admitted last month for the same, states that helped a lot.  He currently is denying SI, HI, or AVH to me.       HPI  Past Medical History:  Diagnosis Date  . CHF (congestive heart failure) (HCC)   . Exposure to hepatitis B   . Exposure to hepatitis C   . Hepatitis C   . 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, ending around 2017  . Hypertension    . IV drug abuse (HCC)   . Renal disorder    kidney stones    Patient Active Problem List   Diagnosis Date Noted  . Major depressive disorder 07/14/2019  . Alcohol abuse 07/14/2019  . 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 Cocaine abuse (HCC) 07/12/2019  . Chest pain 11/28/2017  . Essential hypertension 11/28/2017  . Low back pain 11/28/2017  . NICM (nonischemic cardiomyopathy) (HCC) 11/28/2017  . Nephrolithiasis 11/28/2017    Past Surgical History:  Procedure Laterality Date  . APPENDECTOMY    . EYE SURGERY     secondary to dog bite        Home Medications    Prior to Admission medications   Medication Sig Start Date End Date Taking? Authorizing Provider  cyclobenzaprine (FLEXERIL) 10 MG tablet Take 1 tablet (10 mg total) by mouth 2 (two) times daily as needed for muscle spasms. 08/03/19   Eustace Moore, MD  gabapentin (NEURONTIN) 300 MG capsule Take 1 capsule (300 mg total) by mouth 3 (three) times daily. 08/03/19   Eustace Moore, MD  pantoprazole (PROTONIX) 40 MG tablet Take 1 tablet (40 mg total) by mouth daily. 07/17/19 08/03/19  Clapacs, Jackquline Denmark, MD    Family History Family History  Problem Relation Age of Onset  . Hypertension Father     Social History Social History  Tobacco Use  . Smoking status: Former Games developer  . Smokeless tobacco: Never Used  Substance Use Topics  . Alcohol use: No  . Drug use: Yes    Types: IV, Cocaine    Comment: hx of heroin abuse (opioid addiction), crack     Allergies   Amoxicillin   Review of Systems Review of Systems  Respiratory: Positive for chest tightness and shortness of breath.   Cardiovascular: Positive for chest pain.  All other systems reviewed and are negative.    Physical Exam Updated Vital Signs BP (!) 146/89 (BP Location: Right Arm)   Pulse 83   Temp 98.1 F (36.7 C) (Oral)   Resp 16   SpO2 99%   Physical Exam Vitals signs and nursing note reviewed.  Constitutional:       General: He is not in acute distress.    Appearance: He is well-developed.     Comments: Appears nontoxic  HENT:     Head: Normocephalic and atraumatic.  Eyes:     Conjunctiva/sclera: Conjunctivae normal.     Pupils: Pupils are equal, round, and reactive to light.  Neck:     Musculoskeletal: Normal range of motion and neck supple.  Cardiovascular:     Rate and Rhythm: Normal rate and regular rhythm.     Pulses: Normal pulses.  Pulmonary:     Effort: Pulmonary effort is normal. No respiratory distress.     Breath sounds: Normal breath sounds. No wheezing.     Comments: Clear lung sounds in all fields.  Speaking in full sentences. Abdominal:     General: There is no distension.     Palpations: Abdomen is soft. There is no mass.     Tenderness: There is no abdominal tenderness. There is no guarding or rebound.  Musculoskeletal: Normal range of motion.     Right lower leg: No edema.     Left lower leg: No edema.     Comments: No leg swelling  Skin:    General: Skin is warm and dry.     Capillary Refill: Capillary refill takes less than 2 seconds.  Neurological:     Mental Status: [name] to person, place, and time.      ED Treatments / Results  Labs (all labs ordered are listed, but only abnormal results are displayed) Labs Reviewed  BASIC METABOLIC PANEL - Abnormal; Notable for the following components:      Result Value   Creatinine, Ser 1.26 (*)    All other components within normal limits  HEPATIC FUNCTION PANEL - Abnormal; Notable for the following components:   AST 46 (*)    ALT 52 (*)    Total Bilirubin 1.3 (*)    Indirect Bilirubin 1.2 (*)    All other components within normal limits  SARS CORONAVIRUS 2 (TAT 6-24 HRS)  CBC  ETHANOL  RAPID URINE DRUG SCREEN, HOSP PERFORMED  SALICYLATE LEVEL  ACETAMINOPHEN LEVEL  TROPONIN I (HIGH SENSITIVITY)  TROPONIN I (HIGH SENSITIVITY)    EKG EKG Interpretation  Date/Time:  Saturday August 28 2019  07:55:46 EST Ventricular Rate:  94 PR Interval:    QRS Duration: 88 QT Interval:  347 QTC Calculation: 434 R Axis:   100 Text Interpretation: Sinus rhythm Right axis deviation ST elev, probable normal early repol pattern When compared to prior, no significant changes seen. No STEMI Confirmed by Theda Belfast ([id number]) on 08/28/2019 8:30:15 AM   Radiology Dg Chest 2 View  Result Date: 08/28/2019  CLINICAL DATA:  Chest tightness and shortness of breath.  Drug use. EXAM: CHEST - 2 VIEW COMPARISON:  07/14/2019 FINDINGS: Midline trachea.  Normal heart size and mediastinal contours. Sharp costophrenic angles.  No pneumothorax.  Clear lungs. IMPRESSION: No active cardiopulmonary disease. Electronically Signed   By: Abigail Miyamoto M.D.   On: 08/28/2019 08:13    Procedures Procedures (including critical care time)  Medications Ordered in ED Medications  sodium chloride flush (NS) 0.9 % injection 3 mL (3 mLs Intravenous Given 08/28/19 1053)  acetaminophen (TYLENOL) tablet 1,000 mg (1,000 mg Oral Given 08/28/19 0816)  alum & mag hydroxide-simeth (MAALOX/MYLANTA) 200-200-20 MG/5ML suspension 30 mL (30 mLs Oral Given 08/28/19 0842)    And  lidocaine (XYLOCAINE) 2 % viscous mouth solution 15 mL (15 mLs Oral Given 08/28/19 0842)  sodium chloride 0.9 % bolus 500 mL (0 mLs Intravenous Stopped 08/28/19 1031)  ketorolac (TORADOL) 15 MG/ML injection 15 mg (15 mg Intramuscular Given 08/28/19 1053)     Initial Impression / Assessment and Plan / ED Course  I have reviewed the triage vital signs and the nursing notes.  Pertinent labs & imaging results that were available during my care of the patient were reviewed by me and considered in my medical decision making (see chart for details).        Patient presenting for evaluation of chest pain.  Physical exam reassuring, he appears nontoxic.  Recent use of crack cocaine and alcohol.  He has been seen previously in the ED for similar chest pain after  cocaine use.  Will obtain labs including troponin, EKG, and chest x-ray.  He will likely need a delta troponin due to recent onset.  X-ray viewed interpreted by me, no pneumonia pneumothorax or effusion.  EKG without STEMI.  Labs reassuring, initial troponin 5.  Delta pending.  Repeat troponin stable at 5.  On reassessment, patient is sitting comfortably eating a [place] sandwich in no distress.  Discussed findings with patient.  Discussed plan for discharge with outpatient resources.  Patient states that he is to speak to somebody from psychiatry.  I discussed that earlier he had said that he was feeling better than before.  Patient states he is now more suicidal.  He states he has access to a firearm, and if he leaves he will shoot himself.  As such, will consult TTS, although I have a suspicion for secondary gain.  TTS talked with patient she denied SI, HI, or AVH.  Patient states he is feeling fine and wants to leave.  He states he is not suicidal at this time when talking to TTS.  As I have a low suspicion that he was actually suicidal initially, will discharge patient with resources. Return precautions given. Crisis hotline numbers given.   Final Clinical Impressions(s) / ED Diagnoses   Final diagnoses:  Chest pain, unspecified type  Cocaine use  Alcohol abuse    ED Discharge Orders         Ordered    Consult to Peer Support    Provider:  (Not yet assigned)   08/28/19 [place], Aveena Bari, PA-C 08/28/19 1447    Tegeler, Gwenyth Allegra, MD 08/28/19 [phone]

## 2019-08-28 NOTE — ED Triage Notes (Signed)
C/o tightness to center of chest, SOB, and fatigue since 2am.  States he used crack all day yesterday and this is the 3rd visit for CP after using crack all day.

## 2019-08-28 NOTE — Discharge Instructions (Signed)
Stop using cocaine and alcohol. There is information about resources for substance abuse and mental health paperwork.  It is important that you follow-up. There is also information about crisis hotline numbers if you have increased thoughts of suicidality, call the number come to the ED. I have ordered a peer support consult, so someone should reach out to you to help you with recovery.  Use Tylenol or ibuprofen as needed for pain. Return to the emergency room with any new, worsening, concerning symptoms.

## 2019-09-04 ENCOUNTER — Other Ambulatory Visit: Payer: Self-pay

## 2019-09-04 ENCOUNTER — Emergency Department (HOSPITAL_COMMUNITY)
Admission: EM | Admit: 2019-09-04 | Discharge: 2019-09-05 | Disposition: A | Discharge status: Other Acute Inpatient Hospital | Payer: Self-pay | Attending: Emergency Medicine | Admitting: Emergency Medicine

## 2019-09-04 ENCOUNTER — Emergency Department (HOSPITAL_COMMUNITY): Payer: Self-pay

## 2019-09-04 ENCOUNTER — Encounter (HOSPITAL_COMMUNITY): Payer: Self-pay

## 2019-09-04 DIAGNOSIS — R0789 Other chest pain: Secondary | ICD-10-CM | POA: Insufficient documentation

## 2019-09-04 DIAGNOSIS — R45851 Suicidal ideations: Secondary | ICD-10-CM | POA: Insufficient documentation

## 2019-09-04 DIAGNOSIS — Z20828 Contact with and (suspected) exposure to other viral communicable diseases: Secondary | ICD-10-CM | POA: Insufficient documentation

## 2019-09-04 DIAGNOSIS — R110 Nausea: Secondary | ICD-10-CM | POA: Insufficient documentation

## 2019-09-04 DIAGNOSIS — R251 Tremor, unspecified: Secondary | ICD-10-CM | POA: Insufficient documentation

## 2019-09-04 DIAGNOSIS — Z87891 Personal history of nicotine dependence: Secondary | ICD-10-CM | POA: Insufficient documentation

## 2019-09-04 DIAGNOSIS — I509 Heart failure, unspecified: Secondary | ICD-10-CM | POA: Insufficient documentation

## 2019-09-04 DIAGNOSIS — R000 Tachycardia, unspecified: Secondary | ICD-10-CM | POA: Insufficient documentation

## 2019-09-04 DIAGNOSIS — Z046 Encounter for general psychiatric examination, requested by authority: Secondary | ICD-10-CM | POA: Insufficient documentation

## 2019-09-04 DIAGNOSIS — I110 Hypertensive heart disease with heart failure: Secondary | ICD-10-CM | POA: Insufficient documentation

## 2019-09-04 DIAGNOSIS — R0602 Shortness of breath: Secondary | ICD-10-CM | POA: Insufficient documentation

## 2019-09-04 DIAGNOSIS — R5383 Other fatigue: Secondary | ICD-10-CM | POA: Insufficient documentation

## 2019-09-04 DIAGNOSIS — F1994 Other psychoactive substance use, unspecified with psychoactive substance-induced mood disorder: Secondary | ICD-10-CM | POA: Insufficient documentation

## 2019-09-04 DIAGNOSIS — Z79899 Other long term (current) drug therapy: Secondary | ICD-10-CM | POA: Insufficient documentation

## 2019-09-04 LAB — CBC
HCT: 47.5 % (ref 39.0–52.0)
Hemoglobin: 15.6 g/dL (ref 13.0–17.0)
MCH: 30.5 pg (ref 26.0–34.0)
MCHC: 32.8 g/dL (ref 30.0–36.0)
MCV: 92.8 fL (ref 80.0–100.0)
Platelets: 342 10*3/uL (ref 150–400)
RBC: 5.12 MIL/uL (ref 4.22–5.81)
RDW: 13.6 % (ref 11.5–15.5)
WBC: 9.6 10*3/uL (ref 4.0–10.5)
nRBC: 0 % (ref 0.0–0.2)

## 2019-09-04 LAB — BASIC METABOLIC PANEL
Anion gap: 17 — ABNORMAL HIGH (ref 5–15)
BUN: 18 mg/dL (ref 6–20)
CO2: 17 mmol/L — ABNORMAL LOW (ref 22–32)
Calcium: 9.5 mg/dL (ref 8.9–10.3)
Chloride: 105 mmol/L (ref 98–111)
Creatinine, Ser: 1.26 mg/dL — ABNORMAL HIGH (ref 0.61–1.24)
GFR calc Af Amer: >60 mL/min (ref >60)
GFR calc non Af Amer: >60 mL/min (ref >60)
Glucose, Bld: 96 mg/dL (ref 70–99)
Potassium: 4.2 mmol/L (ref 3.5–5.1)
Sodium: 139 mmol/L (ref 135–145)

## 2019-09-04 LAB — TROPONIN I (HIGH SENSITIVITY): Troponin I (High Sensitivity): 3 ng/L (ref <18)

## 2019-09-04 LAB — ETHANOL: Alcohol, Ethyl (B): <10 mg/dL (ref <10)

## 2019-09-04 LAB — SALICYLATE LEVEL: Salicylate Lvl: <7 mg/dL (ref 2.8–30.0)

## 2019-09-04 LAB — ACETAMINOPHEN LEVEL: Acetaminophen (Tylenol), Serum: <10 ug/mL — ABNORMAL LOW (ref 10–30)

## 2019-09-04 MED ORDER — LORAZEPAM 2 MG/ML IJ SOLN
1.0000 mg | Freq: Once | INTRAMUSCULAR | Status: AC
  Administered 2019-09-04: 1 mg via INTRAVENOUS
  Filled 2019-09-04: qty 1

## 2019-09-04 MED ORDER — SODIUM CHLORIDE 0.9 % IV BOLUS
1000.0000 mL | Freq: Once | INTRAVENOUS | Status: AC
  Administered 2019-09-04: 1000 mL via INTRAVENOUS

## 2019-09-04 NOTE — ED Notes (Signed)
Patient transported to X-ray 

## 2019-09-04 NOTE — ED Notes (Signed)
Pt aware we are waiting on [employer] drug screen

## 2019-09-04 NOTE — ED Provider Notes (Signed)
[facility] EMERGENCY DEPARTMENT Provider Note   CSN: [id number] Arrival date & time: 09/04/19  2135     History   Chief Complaint Chief Complaint  Patient presents with  . Chest Pain  . Suicidal    HPI Randall Parsons is a 38 y.o. male.     HPI   Randall Parsons is a 38 y.o. male, with a history of CHF, hepatitis C, HTN, polysubstance abuse, presenting to the ED with chest pain beginning shortly prior to arrival.  Pain is in the left chest, constant, sharp/pressure, nonradiating, 10/10.  Accompanied by shortness of breath.  Received 324 mg aspirin via EMS. Pain started after using crack cocaine.  He has been using crack cocaine over the last 3 days.  He has had similar symptoms in the past with crack cocaine use. Patient also complains of suicidal ideations.  States he may overdose on cocaine or use a firearm. Denies fever/chills, cough, abdominal pain, syncope, dizziness, N/V/D, neurologic deficits, or any other complaints.     Past Medical History:  Diagnosis Date  . CHF (congestive heart failure) ([place])   . Exposure to hepatitis B   . Exposure to hepatitis C   . Hepatitis C   . History of opioid abuse ([place])    reports heroin abuse, ending around 2017  . Hypertension   . IV drug abuse ([place])   . Renal disorder    kidney stones    Patient Active Problem List   Diagnosis Date Noted  . Major depressive disorder 07/14/2019  . Alcohol abuse 07/14/2019  . Alcohol-induced mood disorder ([place]) 07/12/2019  . Cocaine abuse ([place]) 07/12/2019  . Chest pain 11/28/2017  . Essential hypertension 11/28/2017  . Low back pain 11/28/2017  . NICM (nonischemic cardiomyopathy) ([place]) 11/28/2017  . Nephrolithiasis 11/28/2017    Past Surgical History:  Procedure Laterality Date  . APPENDECTOMY    . EYE SURGERY     secondary to dog bite        Home Medications    Prior to Admission medications   Medication Sig Start Date End Date Taking? Authorizing Provider   cyclobenzaprine (FLEXERIL) 10 MG tablet Take 1 tablet (10 mg total) by mouth 2 (two) times daily as needed for muscle spasms. 08/03/19  Yes Raylene Everts, MD  gabapentin (NEURONTIN) 300 MG capsule Take 1 capsule (300 mg total) by mouth 3 (three) times daily. 08/03/19  Yes Raylene Everts, MD  pantoprazole (PROTONIX) 40 MG tablet Take 1 tablet (40 mg total) by mouth daily. 07/17/19 08/03/19  Clapacs, Madie Reno, MD    Family History Family History  Problem Relation Age of Onset  . Hypertension Father     Social History Social History   Tobacco Use  . Smoking status: Former Research scientist (life sciences)  . Smokeless tobacco: Never Used  Substance Use Topics  . Alcohol use: No  . Drug use: Yes    Types: IV, Cocaine    Comment: hx of heroin abuse (opioid addiction), crack     Allergies   Amoxicillin   Review of Systems Review of Systems  Constitutional: Negative for chills, diaphoresis and fever.  Respiratory: Positive for shortness of breath. Negative for cough.   Cardiovascular: Positive for chest pain. Negative for leg swelling.  Gastrointestinal: Negative for abdominal pain, diarrhea, nausea and vomiting.  Neurological: Negative for dizziness, syncope, weakness and numbness.  Psychiatric/Behavioral: Positive for suicidal ideas.  All other systems reviewed and are negative.    Physical Exam Updated Vital Signs  BP (!) 132/99 (BP Location: Right Arm)   Pulse (!) 105   Temp 97.8 F (36.6 C) (Oral)   Resp (!) 28   Ht 5\' 9"  (1.753 m)   Wt 86.2 kg   SpO2 97%   BMI 28.06 kg/m   Physical Exam Vitals signs and nursing note reviewed.  Constitutional:      General: He is not in acute distress.    Appearance: He is well-developed. He is not diaphoretic.  HENT:     Head: Normocephalic and atraumatic.     Mouth/Throat:     Mouth: Mucous membranes are moist.     Pharynx: Oropharynx is clear.  Eyes:     Conjunctiva/sclera: Conjunctivae normal.  Neck:     Musculoskeletal: Neck supple.   Cardiovascular:     Rate and Rhythm: Regular rhythm. Tachycardia present.     Pulses: Normal pulses.          Radial pulses are 2+ on the right side and 2+ on the left side.       Posterior tibial pulses are 2+ on the right side and 2+ on the left side.     Heart sounds: Normal heart sounds.     Comments: Tactile temperature in the extremities appropriate and equal bilaterally. Pulmonary:     Effort: Pulmonary effort is normal. No respiratory distress.     Breath sounds: Normal breath sounds.  Abdominal:     Palpations: Abdomen is soft.     Tenderness: There is no abdominal tenderness. There is no guarding.  Musculoskeletal:     Right lower leg: No edema.     Left lower leg: No edema.  Lymphadenopathy:     Cervical: No cervical adenopathy.  Skin:    General: Skin is warm and dry.  Neurological:     Mental Status: He is alert.     Comments: Sensation grossly intact to light touch in the extremities.  Grip strengths equal bilaterally.  Strength 5/5 in all extremities. No gait disturbance. Coordination intact. Cranial nerves III-XII grossly intact. No facial droop.   Psychiatric:        Mood and Affect: Mood and affect normal.        Speech: Speech normal.        Behavior: Behavior normal.      ED Treatments / Results  Labs (all labs ordered are listed, but only abnormal results are displayed) Labs Reviewed  BASIC METABOLIC PANEL - Abnormal; Notable for the following components:      Result Value   CO2 17 (*)    Creatinine, Ser 1.26 (*)    Anion gap 17 (*)    All other components within normal limits  ACETAMINOPHEN LEVEL - Abnormal; Notable for the following components:   Acetaminophen (Tylenol), Serum <10 (*)    All other components within normal limits  CBC  ETHANOL  SALICYLATE LEVEL  RAPID URINE DRUG SCREEN, HOSP PERFORMED  TROPONIN I (HIGH SENSITIVITY)  TROPONIN I (HIGH SENSITIVITY)   BUN  Date Value Ref Range Status  09/04/2019 18 6 - 20 mg/dL Final   93/23/5573 12 6 - 20 mg/dL Final  22/11/5425 13 6 - 20 mg/dL Final  03/30/7627 12 6 - 20 mg/dL Final   Creatinine, Ser  Date Value Ref Range Status  09/04/2019 1.26 (H) 0.61 - 1.24 mg/dL Final  31/51/7616 0.73 (H) 0.61 - 1.24 mg/dL Final  71/03/2693 8.54 0.61 - 1.24 mg/dL Final  62/70/3500 9.38 0.61 - 1.24 mg/dL Final  EKG EKG Interpretation  Date/Time:  Saturday September 04 2019 21:44:51 EST Ventricular Rate:  103 PR Interval:    QRS Duration: 89 QT Interval:  318 QTC Calculation: 417 R Axis:   102 Text Interpretation: Sinus tachycardia Probable lateral infarct, old Baseline wander in lead(s) I III aVL Confirmed by Tilden Fossa [id number]) on 09/04/2019 9:53:59 PM   Radiology Dg Chest 2 View  Result Date: 09/04/2019 CLINICAL DATA:  38 year old male with chest pain. EXAM: CHEST - 2 VIEW COMPARISON:  Chest radiograph dated 08/28/2019 FINDINGS: The heart size and mediastinal contours are within normal limits. Both lungs are clear. The visualized skeletal structures are unremarkable. IMPRESSION: No active cardiopulmonary disease. Electronically Signed   By: Elgie Collard M.D.   On: 09/04/2019 22:19    Procedures Procedures (including critical care time)  Medications Ordered in ED Medications  LORazepam (ATIVAN) injection 1 mg (1 mg Intravenous Given 09/04/19 2346)  sodium chloride 0.9 % bolus 1,000 mL (1,000 mLs Intravenous New Bag/Given 09/04/19 2346)     Initial Impression / Assessment and Plan / ED Course  I have reviewed the triage vital signs and the nursing notes.  Pertinent labs & imaging results that were available during my care of the patient were reviewed by me and considered in my medical decision making (see chart for details).        Patient presents with chest pain after using crack cocaine. Patient is nontoxic appearing, afebrile, not hypotensive, maintains excellent SPO2 on room air. EKG without evidence of acute ischemia. Dissection was  considered, but thought less likely base on: History and description of the pain are not suggestive, patient is not ill-appearing, lack of risk factors, equal bilateral pulses, lack of neurologic deficits, no widened mediastinum on chest x-ray.  End of shift patient care handoff report given to Frederik Pear, PA-C. Plan: Awaiting second troponin, treatment for his chest discomfort, and TTS evaluation.  Vitals:   09/04/19 2141 09/04/19 2145 09/04/19 2300 09/05/19 0030  BP:  (!) 132/99 (!) 132/97 122/85  Pulse:  (!) 105 100 95  Resp:  (!) 28 (!) 27 (!) 27  Temp:  97.8 F (36.6 C)    TempSrc:  Oral    SpO2:  97% 96% 97%  Weight: 86.2 kg     Height: 5\' 9"  (1.753 m)        Final Clinical Impressions(s) / ED Diagnoses   Final diagnoses:  None    ED Discharge Orders    None       09/05/19 0100    09/07/19, MD 09/05/19 1306

## 2019-09-04 NOTE — ED Triage Notes (Signed)
Pt BIB [facility] for eval of L sided chest pain onset after using crack cocaine. Pt reporst that he smokes cocaine every day, has had 3 STEMIs d/t same. EMS reports minimal elevation on 12 lead but not enough to call. Pt reports SOB as well. Rec'd [id number] ASA, refused NTG

## 2019-09-04 NOTE — ED Notes (Signed)
Staffing has a sitter coming at 11 pm

## 2019-09-04 NOTE — ED Notes (Signed)
Sitter at bedside. Pt is dressed into purple scrubs.

## 2019-09-04 NOTE — ED Provider Notes (Signed)
Formatting of this note is different from the original.  Images from the original note were not included.    [facility] EMERGENCY DEPARTMENT  Provider Note    CSN: [id number]  Arrival date & time: 09/04/19  2135        History    Chief Complaint  Chief Complaint   Patient presents with   ? Chest Pain   ? Suicidal     HPI  Lucas Hicks is a 38 y.o. male.        HPI     Lucas Hicks is a 37 y.o. male, with a history of CHF, hepatitis C, HTN, polysubstance abuse, presenting to the ED with chest pain beginning shortly prior to arrival.  Pain is in the left chest, constant, sharp/pressure, nonradiating, 10/10.  Accompanied by shortness of breath.  Received 324 mg aspirin via EMS.  Pain started after using crack cocaine.  He has been using crack cocaine over the last 3 days.  He has had similar symptoms in the past with crack cocaine use.  Patient also complains of suicidal ideations.  States he may overdose on cocaine or use a firearm.  Denies fever/chills, cough, abdominal pain, syncope, dizziness, N/V/D, neurologic deficits, or any other complaints.    Past Medical History:   Diagnosis Date   ? CHF (congestive heart failure) (HCC)    ? Exposure to hepatitis B    ? Exposure to hepatitis C    ? Hepatitis C    ? History of opioid abuse (HCC)     reports heroin abuse, ending around 2017   ? Hypertension    ? IV drug abuse (HCC)    ? Renal disorder     kidney stones     Patient Active Problem List    Diagnosis Date Noted   ? Major depressive disorder 07/14/2019   ? Alcohol abuse 07/14/2019   ? 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 Cocaine abuse (HCC) 07/12/2019   ? Chest pain 11/28/2017   ? Essential hypertension 11/28/2017   ? Low back pain 11/28/2017   ? NICM (nonischemic cardiomyopathy) (HCC) 11/28/2017   ? Nephrolithiasis 11/28/2017     Past Surgical History:   Procedure Laterality Date   ? APPENDECTOMY     ? EYE SURGERY      secondary to dog bite       Home Medications      Prior to  Admission medications    Medication Sig Start Date End Date Taking? Authorizing Provider   cyclobenzaprine (FLEXERIL) 10 MG tablet Take 1 tablet (10 mg total) by mouth 2 (two) times daily as needed for muscle spasms. 08/03/19  Yes Eustace Moore, MD   gabapentin (NEURONTIN) 300 MG capsule Take 1 capsule (300 mg total) by mouth 3 (three) times daily. 08/03/19  Yes Eustace Moore, MD   pantoprazole (PROTONIX) 40 MG tablet Take 1 tablet (40 mg total) by mouth daily. 07/17/19 08/03/19  Clapacs, Jackquline Denmark, MD     Family History  Family History   Problem Relation Age of Onset   ? Hypertension Father      Social History  Social History     Tobacco Use   ? Smoking status: Former Smoker   ? Smokeless tobacco: Never Used   Substance Use Topics   ? Alcohol use: No   ? Drug use: Yes     Types: IV, Cocaine     Comment: hx of heroin abuse (opioid  addiction), crack     Allergies    Amoxicillin    Review of Systems  Review of Systems   Constitutional: Negative for chills, diaphoresis and fever.   Respiratory: Positive for shortness of breath. Negative for cough.    Cardiovascular: Positive for chest pain. Negative for leg swelling.   Gastrointestinal: Negative for abdominal pain, diarrhea, nausea and vomiting.   Neurological: Negative for dizziness, syncope, weakness and numbness.   Psychiatric/Behavioral: Positive for suicidal ideas.   All other systems reviewed and are negative.    Physical Exam  Updated Vital Signs  BP (!) 132/99 (BP Location: Right Arm)   Pulse (!) 105   Temp 97.8 F (36.6 C) (Oral)   Resp (!) 28   Ht 5\' 9"  (1.753 m)   Wt 86.2 kg   SpO2 97%   BMI 28.06 kg/m     Physical Exam  Vitals signs and nursing note reviewed.   Constitutional:       General: He is not in acute distress.     Appearance: He is well-developed. He is not diaphoretic.   HENT:      Head: Normocephalic and atraumatic.      Mouth/Throat:      Mouth: Mucous membranes are moist.      Pharynx: Oropharynx is clear.   Eyes:       Conjunctiva/sclera: Conjunctivae normal.   Neck:      Musculoskeletal: Neck supple.   Cardiovascular:      Rate and Rhythm: Regular rhythm. Tachycardia present.      Pulses: Normal pulses.           Radial pulses are 2+ on the right side and 2+ on the left side.        Posterior tibial pulses are 2+ on the right side and 2+ on the left side.      Heart sounds: Normal heart sounds.      Comments: Tactile temperature in the extremities appropriate and equal bilaterally.  Pulmonary:      Effort: Pulmonary effort is normal. No respiratory distress.      Breath sounds: Normal breath sounds.   Abdominal:      Palpations: Abdomen is soft.      Tenderness: There is no abdominal tenderness. There is no guarding.   Musculoskeletal:      Right lower leg: No edema.      Left lower leg: No edema.   Lymphadenopathy:      Cervical: No cervical adenopathy.   Skin:     General: Skin is warm and dry.   Neurological:      Mental Status: He is alert.      Comments: Sensation grossly intact to light touch in the extremities.  Grip strengths equal bilaterally.  Strength 5/5 in all extremities. No gait disturbance. Coordination intact. Cranial nerves III-XII grossly intact. No facial droop.    Psychiatric:         Mood and Affect: Mood and affect normal.         Speech: Speech normal.         Behavior: Behavior normal.     ED Treatments / Results   Labs  (all labs ordered are listed, but only abnormal results are displayed)  Labs Reviewed   BASIC METABOLIC PANEL - Abnormal; Notable for the following components:       Result Value    CO2 17 (*)     Creatinine, Ser 1.26 (*)     Anion gap  17 (*)     All other components within normal limits   ACETAMINOPHEN LEVEL - Abnormal; Notable for the following components:    Acetaminophen (Tylenol), Serum <10 (*)     All other components within normal limits   CBC   ETHANOL   SALICYLATE LEVEL   RAPID URINE DRUG SCREEN, HOSP PERFORMED   TROPONIN I (HIGH SENSITIVITY)   TROPONIN I (HIGH SENSITIVITY)      BUN   Date Value Ref Range Status   09/04/2019 18 6 - 20 mg/dL Final   09/60/4540 12 6 - 20 mg/dL Final   98/08/9146 13 6 - 20 mg/dL Final   82/95/6213 12 6 - 20 mg/dL Final     Creatinine, Ser   Date Value Ref Range Status   09/04/2019 1.26 (H) 0.61 - 1.24 mg/dL Final   08/65/7846 9.62 (H) 0.61 - 1.24 mg/dL Final   95/28/4132 4.40 0.61 - 1.24 mg/dL Final   08/03/2535 6.44 0.61 - 1.24 mg/dL Final     EKG  EKG Interpretation    Date/Time:  Saturday September 04 2019 21:44:51 EST  Ventricular Rate:  103  PR Interval:     QRS Duration: 89  QT Interval:  318  QTC Calculation: 417  R Axis:   102  Text Interpretation: Sinus tachycardia Probable lateral infarct, old Baseline wander in lead(s) I III aVL Confirmed by Tilden Fossa [phone]) on 09/04/2019 9:53:59 PM    Radiology  Dg Chest 2 View    Result Date: 09/04/2019  CLINICAL DATA:  38 year old male with chest pain. EXAM: CHEST - 2 VIEW COMPARISON:  Chest radiograph dated 08/28/2019 FINDINGS: The heart size and mediastinal contours are within normal limits. Both lungs are clear. The visualized skeletal structures are unremarkable. IMPRESSION: No active cardiopulmonary disease. Electronically Signed   By: Elgie Collard M.D.   On: 09/04/2019 22:19     Procedures  Procedures (including critical care time)    Medications Ordered in ED  Medications   LORazepam (ATIVAN) injection 1 mg (1 mg Intravenous Given 09/04/19 2346)   sodium chloride 0.9 % bolus 1,000 mL (1,000 mLs Intravenous New Bag/Given 09/04/19 2346)     Initial Impression / Assessment and Plan / ED Course   I have reviewed the triage vital signs and the nursing notes.    Pertinent labs & imaging results that were available during my care of the patient were reviewed by me and considered in my medical decision making (see chart for details).            Patient presents with chest pain after using crack cocaine.  Patient is nontoxic appearing, afebrile, not hypotensive, maintains excellent SPO2 on room  air.  EKG without evidence of acute ischemia.  Dissection was considered, but thought less likely base on: History and description of the pain are not suggestive, patient is not ill-appearing, lack of risk factors, equal bilateral pulses, lack of neurologic deficits, no widened mediastinum on chest x-ray.    End of shift patient care handoff report given to Frederik Pear, PA-C.  Plan: Awaiting second troponin, treatment for his chest discomfort, and TTS evaluation.    Vitals:    09/04/19 2141 09/04/19 2145 09/04/19 2300 09/05/19 0030   BP:  (!) 132/99 (!) 132/97 122/85   Pulse:  (!) 105 100 95   Resp:  (!) 28 (!) 27 (!) 27   Temp:  97.8 F (36.6 C)     TempSrc:  Oral     SpO2:  97% 96% 97%   Weight: 86.2 kg      Height: 5\' 9"  (1.753 m)        Final Clinical Impressions(s) / ED Diagnoses     Final diagnoses:   None     ED Discharge Orders     None         Concepcion Living  09/05/19 0100      Tilden Fossa, MD  09/05/19 1306    Electronically signed by Tilden Fossa, MD at 09/05/2019  1:06 PM EST    Associated attestation - Tilden Fossa, MD - 09/05/2019  1:06 PM EST  Formatting of this note might be different from the original.  Attestation: Medical screening examination/treatment/procedure(s) were performed by non-physician practitioner and as supervising physician I was immediately available for consultation/collaboration.    EKG: EKG Interpretation    Date/Time:  Saturday September 04 2019 21:44:51 EST  Ventricular Rate:  103  PR Interval:     QRS Duration: 89  QT Interval:  318  QTC Calculation: 417  R Axis:   102  Text Interpretation: Sinus tachycardia Probable lateral infarct, old Baseline wander in lead(s) I III aVL Confirmed by Tilden Fossa [phone]) on 09/04/2019 9:53:59 PM

## 2019-09-04 NOTE — ED Notes (Signed)
Formatting of this note might be different from the original.  Pt aware we are waiting on [facility] drug screen  Electronically signed by Shelby Dubin, NT at 09/04/2019 11:06 PM EST

## 2019-09-04 NOTE — ED Triage Notes (Signed)
Formatting of this note might be different from the original.  Pt BIB [facility] for eval of L sided chest pain onset after using crack cocaine. Pt reporst that he smokes cocaine every day, has had 3 STEMIs d/t same. EMS reports minimal elevation on 12 lead but not enough to call. Pt reports SOB as well. Rec'd [id number] ASA, refused NTG  Electronically signed by Miki Kins, RN at 09/04/2019  9:40 PM EST

## 2019-09-04 NOTE — ED Notes (Signed)
Formatting of this note might be different from the original.  Patient transported to X-ray  Electronically signed by Jeffie Pollock, RN at 09/04/2019 10:33 PM EST

## 2019-09-04 NOTE — ED Notes (Signed)
Formatting of this note might be different from the original.  Sitter at bedside. Pt is dressed into purple scrubs.   Electronically signed by Shelby Dubin, NT at 09/04/2019 11:04 PM EST

## 2019-09-04 NOTE — ED Notes (Signed)
Formatting of this note might be different from the original.  Staffing has a sitter coming at 11 pm  Electronically signed by Clinton Sawyer at 09/04/2019  9:53 PM EST

## 2019-09-05 ENCOUNTER — Other Ambulatory Visit: Payer: Self-pay

## 2019-09-05 LAB — RAPID URINE DRUG SCREEN, HOSP PERFORMED
Amphetamines: NOT DETECTED
Barbiturates: NOT DETECTED
Benzodiazepines: NOT DETECTED
Cocaine: POSITIVE — AB
Opiates: NOT DETECTED
Tetrahydrocannabinol: NOT DETECTED

## 2019-09-05 LAB — SARS CORONAVIRUS 2 BY RT PCR (HOSPITAL ORDER, PERFORMED IN ~~LOC~~ HOSPITAL LAB): SARS Coronavirus 2: NEGATIVE

## 2019-09-05 LAB — TROPONIN I (HIGH SENSITIVITY): Troponin I (High Sensitivity): 4 ng/L (ref <18)

## 2019-09-05 MED ORDER — THIAMINE HCL 100 MG/ML IJ SOLN: 100.0000 mg | Freq: Every day | INTRAMUSCULAR | Status: DC

## 2019-09-05 MED ORDER — IBUPROFEN 400 MG PO TABS
600.0000 mg | ORAL_TABLET | Freq: Once | ORAL | Status: AC
  Administered 2019-09-05: 600 mg via ORAL
  Filled 2019-09-05: qty 1

## 2019-09-05 MED ORDER — LORAZEPAM 2 MG/ML IJ SOLN: 0.0000 mg | Freq: Four times a day (QID) | INTRAMUSCULAR | Status: DC

## 2019-09-05 MED ORDER — VITAMIN B-1 100 MG PO TABS
100.0000 mg | ORAL_TABLET | Freq: Every day | ORAL | Status: DC
  Administered 2019-09-05: 08:00:00 100 mg via ORAL
  Filled 2019-09-05: qty 1

## 2019-09-05 MED ORDER — LORAZEPAM 2 MG/ML IJ SOLN: 0.0000 mg | Freq: Two times a day (BID) | INTRAMUSCULAR | Status: DC

## 2019-09-05 MED ORDER — LORAZEPAM 1 MG PO TABS
0.0000 mg | ORAL_TABLET | Freq: Four times a day (QID) | ORAL | Status: DC
  Administered 2019-09-05: 1 mg via ORAL
  Filled 2019-09-05: qty 1

## 2019-09-05 MED ORDER — GABAPENTIN 300 MG PO CAPS
300.0000 mg | ORAL_CAPSULE | Freq: Three times a day (TID) | ORAL | Status: DC
  Administered 2019-09-05: 300 mg via ORAL
  Filled 2019-09-05: qty 1

## 2019-09-05 MED ORDER — ACETAMINOPHEN 325 MG PO TABS: 650.0000 mg | ORAL_TABLET | ORAL | Status: DC | PRN

## 2019-09-05 MED ORDER — ALUM & MAG HYDROXIDE-SIMETH 200-200-20 MG/5ML PO SUSP
30.0000 mL | Freq: Four times a day (QID) | ORAL | Status: DC | PRN
  Administered 2019-09-05: 30 mL via ORAL
  Filled 2019-09-05: qty 30

## 2019-09-05 MED ORDER — LORAZEPAM 1 MG PO TABS: 0.0000 mg | ORAL_TABLET | Freq: Two times a day (BID) | ORAL | Status: DC

## 2019-09-05 MED ORDER — ACETAMINOPHEN 500 MG PO TABS
1000.0000 mg | ORAL_TABLET | Freq: Once | ORAL | Status: AC
  Administered 2019-09-05: 1000 mg via ORAL
  Filled 2019-09-05: qty 2

## 2019-09-05 NOTE — ED Notes (Signed)
Breakfast tray ordered 

## 2019-09-05 NOTE — ED Provider Notes (Signed)
38 year old male received at signout from [facility] pending delta troponin.  "Damiano Stamper is a 38 y.o. male, with a history of CHF, hepatitis C, HTN, polysubstance abuse, presenting to the ED with chest pain beginning shortly prior to arrival.  Pain is in the left chest, constant, sharp/pressure, nonradiating, 10/10.  Accompanied by shortness of breath.  Received 324 mg aspirin via EMS. Pain started after using crack cocaine.  He has been using crack cocaine over the last 3 days.  He has had similar symptoms in the past with crack cocaine use. Patient also complains of suicidal ideations.  States he may overdose on cocaine or use a firearm. Denies fever/chills, cough, abdominal pain, syncope, dizziness, N/V/D, neurologic deficits, or any other complaints."  Physical Exam  BP 122/85   Pulse 95   Temp 97.8 F (36.6 C) (Oral)   Resp 20   Ht 5\' 9"  (1.753 m)   Wt 86.2 kg   SpO2 97%   BMI 28.06 kg/m   Physical Exam Vitals signs and nursing note reviewed.  Constitutional:      Appearance: He is well-developed.     Comments: Sleeping. Equal, even respirations.   HENT:     Head: Normocephalic.  Eyes:     Conjunctiva/sclera: Conjunctivae normal.  Neck:     Musculoskeletal: Neck supple.  Cardiovascular:     Rate and Rhythm: Normal rate and regular rhythm.  Pulmonary:     Effort: Pulmonary effort is normal.  Abdominal:     General: There is no distension.     Palpations: Abdomen is soft.  Skin:    General: Skin is warm and dry.     ED Course/Procedures     Procedures  MDM   38 year old male received at signout from [facility] pending delta troponin after using crack cocaine many times over the last 3 days.  Please see his note for further work-up and medical decision making.  Patient is voluntary.  Patient has requested pain medication several times.  Has been given Tylenol and ibuprofen.  He has a history of IV drug use.  Troponin trend is flat. Pt medically cleared at this time.    TTS was consulted and inpatient admission was recommended.  COVID-19 test has been ordered and is pending.  Psych hold orders placed. Please see psych team notes for further documentation of care/dispo. Pt stable at time of med clearance.        Joanne Gavel, PA-C 09/05/19 0541    Palumbo, April, MD 09/05/19 2319

## 2019-09-05 NOTE — ED Notes (Signed)
Pt requested for RN to contact his wife, Lemmie Evens, to advise of being transported to [facility] aware.

## 2019-09-05 NOTE — ED Notes (Addendum)
Pt voiced understanding and agreement w/tx plan - accepted to [facility]. Initially asked if may remain ED until [facility] has a bed available. Advised pt unable to do so - voiced understanding.Encouraged pt to call spouse to advise of tx plan - pt advised he prefers for her not to know. Attempted to call report - no answer - will re-attempt.
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
## 2019-09-05 NOTE — ED Notes (Signed)
Per Randall Parsons - pt reports he received his wallet in valuables envelope but not his cell phone, key, medications. RN located 2nd envelope from Security that has cell phone, Games developer, wallet, and pocket knife documented on it. Verified w/Pharmacy, pt's meds are in Pharmacy. Requested for [name] to advise pt he may either pick up these items from Security and Pharmacy or he may authorize spouse to pick up.

## 2019-09-05 NOTE — ED Provider Notes (Signed)
He has been accepted for treatment and transfer at an outside psychiatric facility.  He is agreeable.  At this time he denies chest pain.  Vital signs reviewed and are stable.  Medical evaluation reviewed and he is cleared for psychiatric treatment.  EMTALA form completed.   Daleen Bo, MD 09/05/19 1133

## 2019-09-05 NOTE — ED Notes (Signed)
ALL belongings - 3 labeled bags incl back pack and 1 valuables envelope - Safe Transport - Pt aware.

## 2019-09-05 NOTE — ED Notes (Signed)
Pt belongings in locker 4 in purple pod-weapon secured by security, meds taken to pharmacy

## 2019-09-05 NOTE — ED Notes (Signed)
Pt awake - is eating breakfast. No tremors noted. Pt asked "Can I have some medicine this morning?" States he is experiencing tremors - pt noted to be shaking right hand as he is holding spoon then able to place food into mouth w/o difficulty w/o hand shaking. Pt also states he is experiencing nausea and fatigue. Denies AVH. Asked pt what type of medicine - pt states "for withdrawals". Pt aware his wife, Nino Parsley, called and is requesting for pt to return her call. Pt voiced understanding of need for urine specimen.

## 2019-09-05 NOTE — ED Notes (Signed)
Pt in shower.  

## 2019-09-05 NOTE — ED Notes (Signed)
Pt's breakfast arrived 

## 2019-09-05 NOTE — Progress Notes (Signed)
Pt accepted to [facility] is the accepting provider.  Call report to [phone] Jacqlyn Larsen, RN @ [facility] ED notified.   Pt is Voluntary.  Pt may be transported by [facility] [place]. Pt scheduled to arrive at [facility] as soon as transportation is set up.   Darletta Moll MSW, [place] Worker Disposition  [facility] Ph: [phone] Fax: [phone]  09/05/2019 10:43 AM

## 2019-09-05 NOTE — BH Assessment (Signed)
[place] Assessment Progress Note   Clinician informed Randall McDonald, PA of disposition being for inpatient care.  And that TTS will seek placment as there are no male beds at [facility].

## 2019-09-05 NOTE — Progress Notes (Signed)
Patient meets criteria for inpatient treatment per Lindon Romp, FNP. No appropriate beds at [facility] currently. CSW faxed referrals to the following facilities for review:  [facility]   [facility]   [facility]     TTS will continue to seek bed placement.     Darletta Moll MSW, [place] Worker Disposition  [facility] Ph: [phone] Fax: [phone] 09/05/2019 9:49 AM

## 2019-09-05 NOTE — ED Notes (Signed)
Pt's lunch ordered 

## 2019-09-05 NOTE — ED Provider Notes (Signed)
Formatting of this note might be different from the original.  He has been accepted for treatment and transfer at an outside psychiatric facility.  He is agreeable.  At this time he denies chest pain.  Vital signs reviewed and are stable.  Medical evaluation reviewed and he is cleared for psychiatric treatment.  EMTALA form completed.    Mancel Bale, MD  09/05/19 1133    Electronically signed by Mancel Bale, MD at 09/05/2019 11:33 AM EST
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
## 2019-09-05 NOTE — Progress Notes (Signed)
Formatting of this note is different from the original.  Patient meets criteria for inpatient treatment per Nira Conn, FNP. No appropriate beds at [facility] currently. CSW faxed referrals to the following facilities for review:    [facility]    [facility]    [facility]    [facility]    [facility]    [facility]    [facility]    [facility]    [facility]    [facility]    [facility]    [facility]     TTS will continue to seek bed placement.     Ruthann Cancer MSW, [facility]  Clincal Social Worker Disposition   [facility]  Ph: [phone]  Fax: [phone]  09/05/2019 9:49 AM  Electronically signed by Otelia Santee, LCSW at 09/05/2019  9:52 AM EST

## 2019-09-05 NOTE — ED Notes (Signed)
Formatting of this note might be different from the original.  Pt's breakfast arrived  Electronically signed by Marcelo Baldy, EMT at 09/05/2019  7:55 AM EST

## 2019-09-05 NOTE — ED Notes (Signed)
Formatting of this note might be different from the original.  ALL belongings - 3 labeled bags incl back pack and 1 valuables envelope - Safe Transport - Pt aware.   Electronically signed by Dub Mikes, RN at 09/05/2019 11:33 AM EST

## 2019-09-05 NOTE — ED Notes (Signed)
Formatting of this note might be different from the original.  Pt's lunch ordered  Electronically signed by Marcelo Baldy, EMT at 09/05/2019 10:08 AM EST

## 2019-09-05 NOTE — ED Notes (Signed)
Formatting of this note might be different from the original.  Breakfast tray ordered  Electronically signed by Gwynne Edinger at 09/05/2019  5:03 AM EST

## 2019-09-05 NOTE — ED Notes (Signed)
Formatting of this note might be different from the original.  Pt awake - is eating breakfast. No tremors noted. Pt asked "Can I have some medicine this morning?" States he is experiencing tremors - pt noted to be shaking right hand as he is holding spoon then able to place food into mouth w/o difficulty w/o hand shaking. Pt also states he is experiencing nausea and fatigue. Denies AVH. Asked pt what type of medicine - pt states "for withdrawals". Pt aware his wife, Glory Buff, called and is requesting for pt to return her call. Pt voiced understanding of need for urine specimen.   Electronically signed by Dub Mikes, RN at 09/05/2019  8:09 AM EST

## 2019-09-05 NOTE — ED Notes (Signed)
Formatting of this note might be different from the original.  Pt belongings in locker 4 in purple pod-weapon secured by security, meds taken to pharmacy  Electronically signed by Maximino Greenland, EMT at 09/05/2019  1:27 AM EST

## 2019-09-05 NOTE — ED Provider Notes (Signed)
Formatting of this note is different from the original.  38 year old male received at signout from [place] [facility] pending delta troponin.    "Lucas Hicks is a 38 y.o. male, with a history of CHF, hepatitis C, HTN, polysubstance abuse, presenting to the ED with chest pain beginning shortly prior to arrival.  Pain is in the left chest, constant, sharp/pressure, nonradiating, 10/10.  Accompanied by shortness of breath.  Received 324 mg aspirin via EMS.  Pain started after using crack cocaine.  He has been using crack cocaine over the last 3 days.  He has had similar symptoms in the past with crack cocaine use.  Patient also complains of suicidal ideations.  States he may overdose on cocaine or use a firearm.  Denies fever/chills, cough, abdominal pain, syncope, dizziness, N/V/D, neurologic deficits, or any other complaints."    Physical Exam   BP 122/85   Pulse 95   Temp 97.8 F (36.6 C) (Oral)   Resp 20   Ht 5\' 9"  (1.753 m)   Wt 86.2 kg   SpO2 97%   BMI 28.06 kg/m     Physical Exam  Vitals signs and nursing note reviewed.   Constitutional:       Appearance: He is well-developed.      Comments: Sleeping. Equal, even respirations.    HENT:      Head: Normocephalic.   Eyes:      Conjunctiva/sclera: Conjunctivae normal.   Neck:      Musculoskeletal: Neck supple.   Cardiovascular:      Rate and Rhythm: Normal rate and regular rhythm.   Pulmonary:      Effort: Pulmonary effort is normal.   Abdominal:      General: There is no distension.      Palpations: Abdomen is soft.   Skin:     General: Skin is warm and dry.     ED Course/Procedures       Procedures    MDM     38 year old male received at signout from [place] Joy pending delta troponin after using crack cocaine many times over the last 3 days.  Please see his note for further work-up and medical decision making.  Patient is voluntary.    Patient has requested pain medication several times.  Has been given Tylenol and ibuprofen.  He has a history of IV drug  use.    Troponin trend is flat. Pt medically cleared at this time.     TTS was consulted and inpatient admission was recommended.  COVID-19 test has been ordered and is pending.    Psych hold orders placed. Please see psych team notes for further documentation of care/dispo. Pt stable at time of med clearance.        Barkley Boards, PA-C  09/05/19 0541      Palumbo, April, MD  09/05/19 2319    Electronically signed by Cy Blamer, MD at 09/05/2019 11:19 PM EST    Associated attestation - Nicanor Alcon, April, MD - 09/05/2019 11:19 PM EST  Formatting of this note might be different from the original.  Attestation: Medical screening examination/treatment/procedure(s) were performed by non-physician practitioner and as supervising physician I was immediately available for consultation/collaboration.    EKG: EKG Interpretation    Date/Time:  Saturday September 04 2019 21:44:51 EST  Ventricular Rate:  103  PR Interval:     QRS Duration: 89  QT Interval:  318  QTC Calculation: 417  R Axis:   102  Text Interpretation: Sinus  tachycardia Probable lateral infarct, old Baseline wander in lead(s) I III aVL Confirmed by Tilden Fossa [phone]) on 09/04/2019 9:53:59 PM

## 2019-09-05 NOTE — ED Notes (Signed)
Formatting of this note might be different from the original.  Pt requested for RN to contact his wife, Leia Alf, to advise of being transported to [place] - Spouse aware.   Electronically signed by Dub Mikes, RN at 09/05/2019 11:49 AM EST

## 2019-09-05 NOTE — Progress Notes (Signed)
Formatting of this note might be different from the original.  Pt accepted to [facility] Synetta Fail is the accepting provider.   Call report to [phone]  Kriste Basque, RN @ [facility] ED notified.    Pt is Voluntary.   Pt may be transported by [employer], [employer].  Pt scheduled to arrive at [facility] as soon as transportation is set up.    Ruthann Cancer MSW, [facility]  Clincal Social Worker Disposition   [facility]  Ph: [phone]  Fax: [phone]   09/05/2019 10:43 AM  Electronically signed by Otelia Santee, LCSW at 09/05/2019 10:46 AM EST

## 2019-09-05 NOTE — ED Notes (Signed)
Formatting of this note might be different from the original.  Per [facility], [facility] - pt reports he received his wallet in valuables envelope but not his cell phone, key, medications. RN located 2nd envelope from Security that has cell phone, Consulting civil engineer, wallet, and pocket knife documented on it. Verified w/Pharmacy, pt's meds are in Pharmacy. Requested for Lanora Manis to advise pt he may either pick up these items from Security and Pharmacy or he may authorize spouse to pick up.   Electronically signed by Dub Mikes, RN at 09/05/2019  3:19 PM EST

## 2019-09-05 NOTE — ED Notes (Signed)
Formatting of this note might be different from the original.  Pt in shower.   Electronically signed by Dub Mikes, RN at 09/05/2019  8:43 AM EST

## 2019-09-05 NOTE — ED Notes (Signed)
Formatting of this note might be different from the original.  Pt voiced understanding and agreement w/tx plan - accepted to [facility]. Initially asked if may remain ED until [facility] has a bed available. Advised pt unable to do so - voiced understanding.Encouraged pt to call spouse to advise of tx plan - pt advised he prefers for her not to know. Attempted to call report - no answer - will re-attempt.   Electronically signed by Dub Mikes, RN at 09/05/2019 11:00 AM EST

## 2019-09-05 NOTE — Unmapped (Signed)
Formatting of this note might be different from the original.  [facility] Assessment Progress Note    Clinician informed Frederik Pear, PA of disposition being for inpatient care.  And that TTS will seek placment as there are no male beds at [facility] tonight.      Electronically signed by Bubba Camp, LCAS at 09/05/2019 12:56 AM EST

## 2019-12-10 IMAGING — CR DG CHEST 2V
2 series · 2 of 2 positions shown · non-contrast
Comparison: 07/14/2019

CLINICAL DATA: Chest tightness and shortness of breath.  Drug use.

EXAM:
CHEST - 2 VIEW

[chest pa]
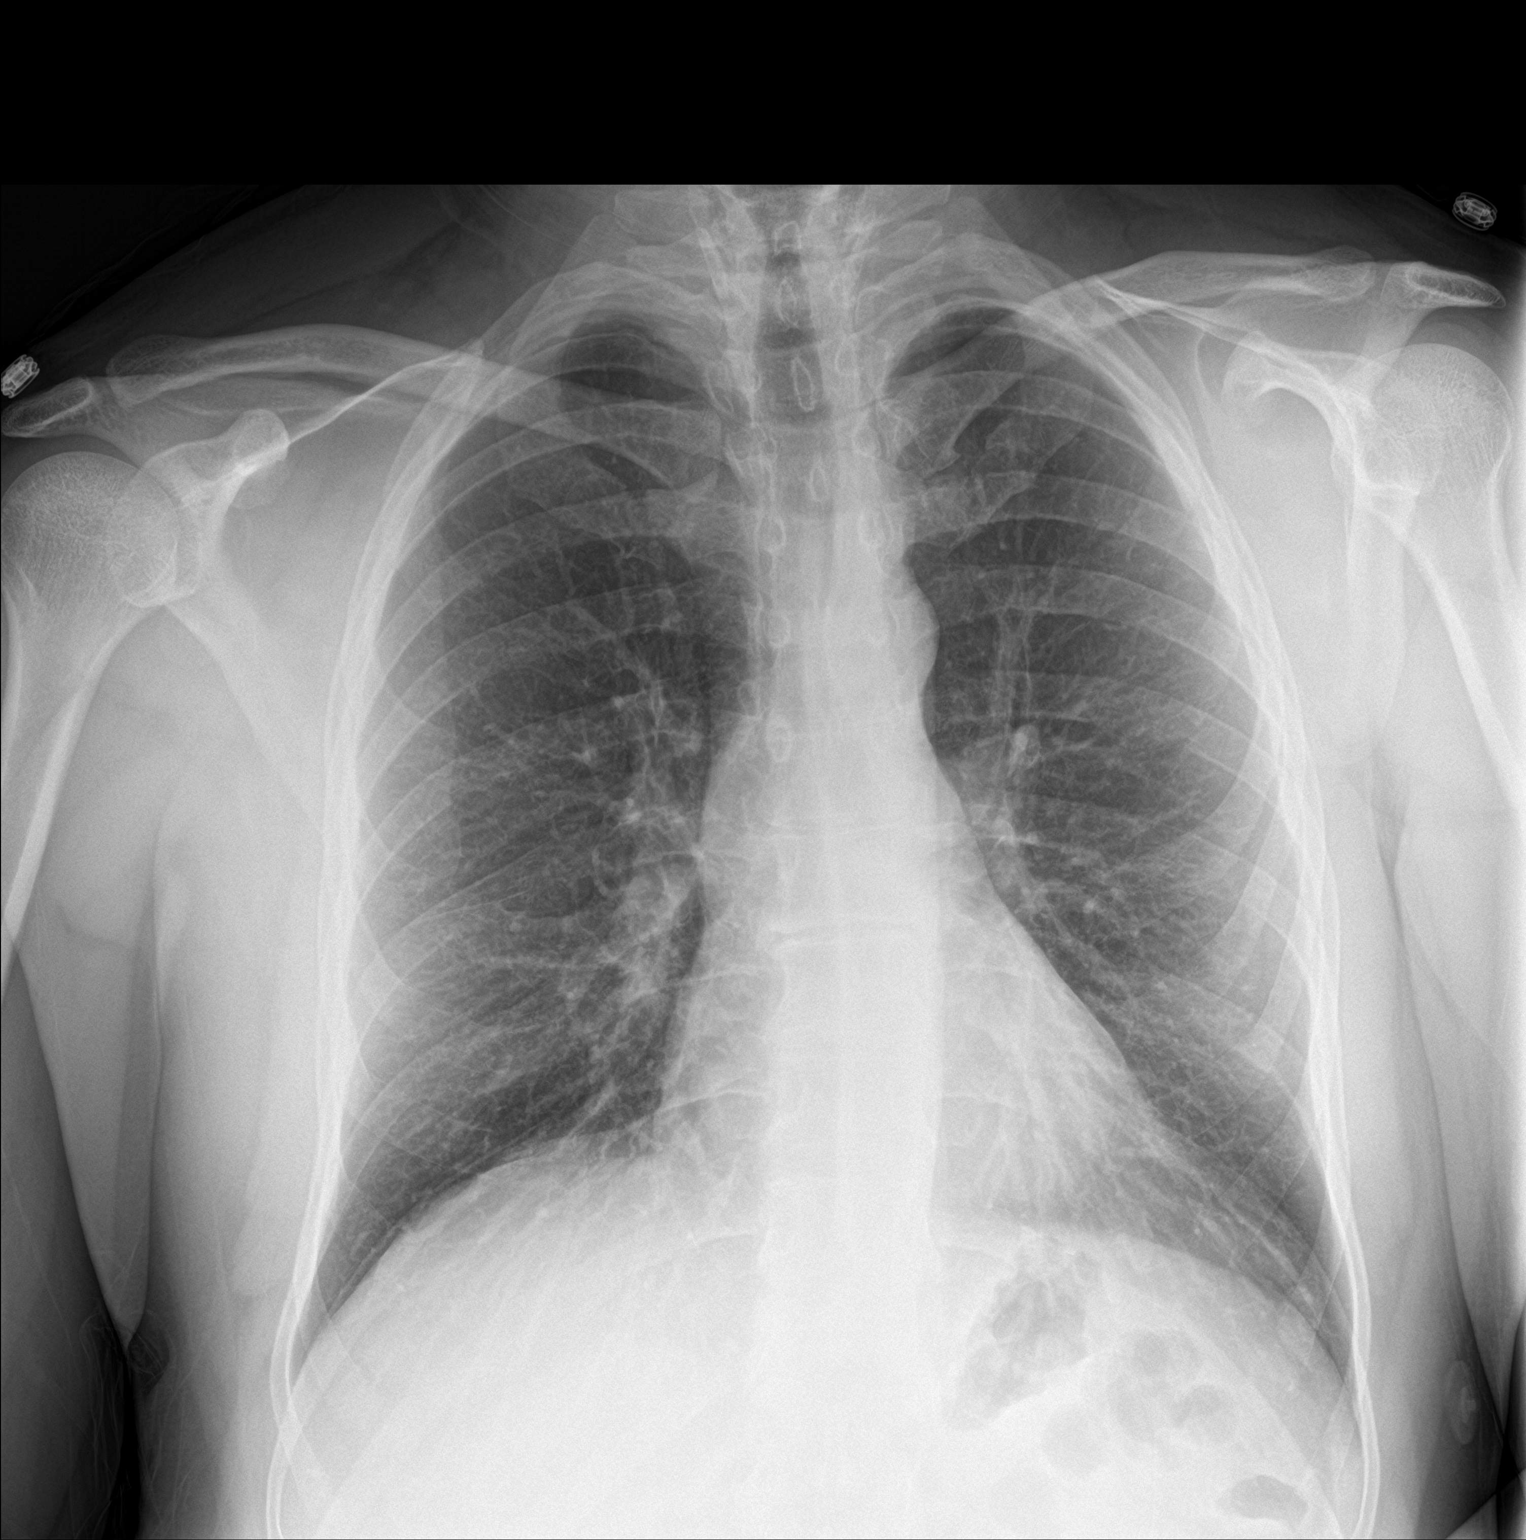

[chest lat]
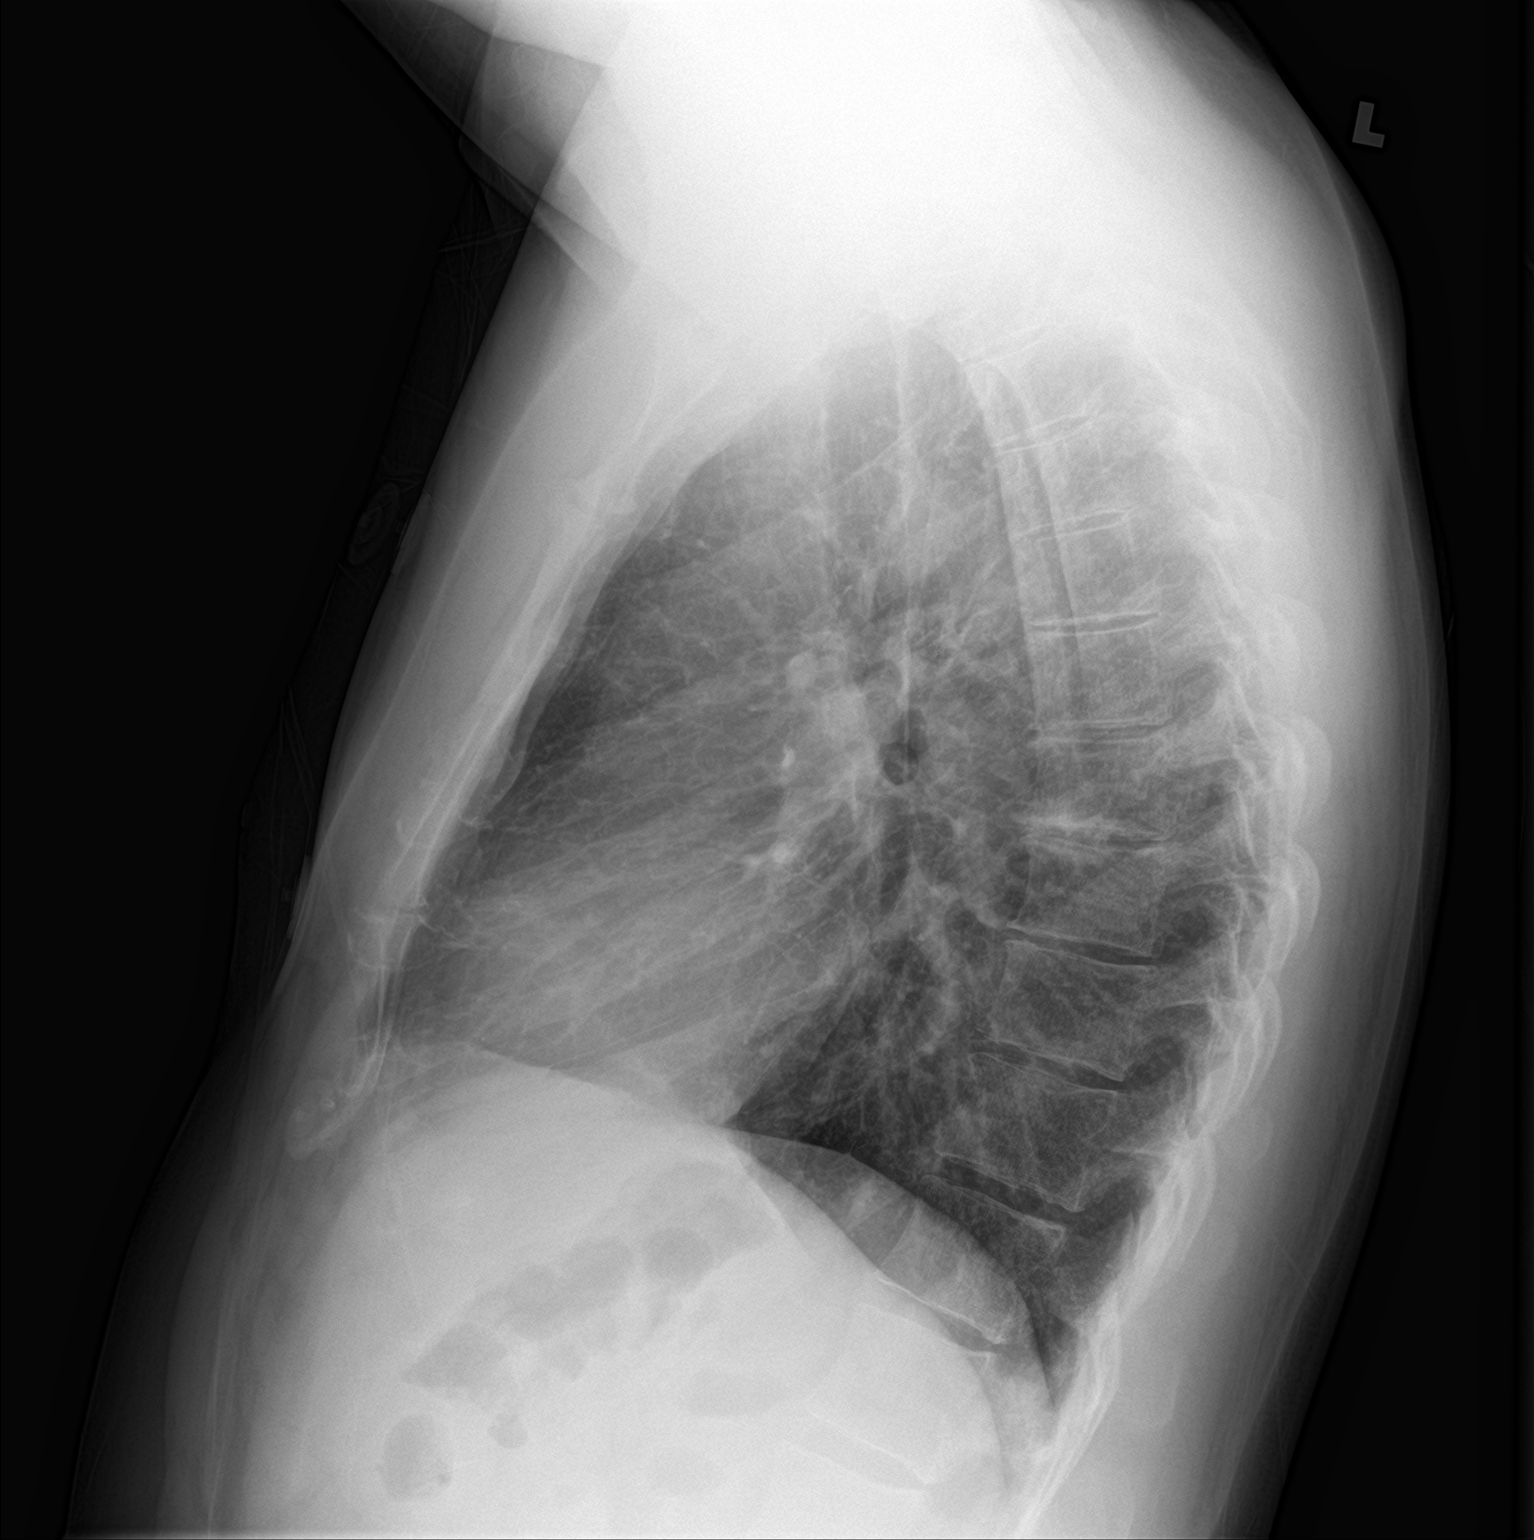

[2 of 2 positions shown; findings below may reference images not displayed]

FINDINGS: Midline trachea.  Normal heart size and mediastinal contours.

Sharp costophrenic angles.  No pneumothorax.  Clear lungs.
IMPRESSION: No active cardiopulmonary disease.

## 2020-02-12 ENCOUNTER — Other Ambulatory Visit: Payer: Self-pay

## 2020-02-12 DIAGNOSIS — Z79899 Other long term (current) drug therapy: Secondary | ICD-10-CM | POA: Insufficient documentation

## 2020-02-12 DIAGNOSIS — I509 Heart failure, unspecified: Secondary | ICD-10-CM | POA: Insufficient documentation

## 2020-02-12 DIAGNOSIS — K5900 Constipation, unspecified: Secondary | ICD-10-CM | POA: Insufficient documentation

## 2020-02-12 DIAGNOSIS — N200 Calculus of kidney: Secondary | ICD-10-CM | POA: Insufficient documentation

## 2020-02-12 DIAGNOSIS — Z87891 Personal history of nicotine dependence: Secondary | ICD-10-CM | POA: Insufficient documentation

## 2020-02-12 DIAGNOSIS — I110 Hypertensive heart disease with heart failure: Secondary | ICD-10-CM | POA: Insufficient documentation

## 2020-02-12 LAB — CBC
HCT: 44.4 % (ref 39.0–52.0)
Hemoglobin: 14.6 g/dL (ref 13.0–17.0)
MCH: 29.6 pg (ref 26.0–34.0)
MCHC: 32.9 g/dL (ref 30.0–36.0)
MCV: 90.1 fL (ref 80.0–100.0)
Platelets: 312 10*3/uL (ref 150–400)
RBC: 4.93 MIL/uL (ref 4.22–5.81)
RDW: 13.6 % (ref 11.5–15.5)
WBC: 5.6 10*3/uL (ref 4.0–10.5)
nRBC: 0 % (ref 0.0–0.2)

## 2020-02-12 LAB — URINALYSIS, COMPLETE (UACMP) WITH MICROSCOPIC
Bacteria, UA: NONE SEEN
Bilirubin Urine: NEGATIVE
Glucose, UA: NEGATIVE mg/dL
Hgb urine dipstick: NEGATIVE
Ketones, ur: NEGATIVE mg/dL
Leukocytes,Ua: NEGATIVE
Nitrite: NEGATIVE
Protein, ur: NEGATIVE mg/dL
Specific Gravity, Urine: 1.017 (ref 1.005–1.030)
Squamous Epithelial / HPF: NONE SEEN (ref 0–5)
pH: 7 (ref 5.0–8.0)

## 2020-02-12 LAB — COMPREHENSIVE METABOLIC PANEL
ALT: 32 U/L (ref 0–44)
AST: 28 U/L (ref 15–41)
Albumin: 4.3 g/dL (ref 3.5–5.0)
Alkaline Phosphatase: 70 U/L (ref 38–126)
Anion gap: 8 (ref 5–15)
BUN: 19 mg/dL (ref 6–20)
CO2: 25 mmol/L (ref 22–32)
Calcium: 9.1 mg/dL (ref 8.9–10.3)
Chloride: 107 mmol/L (ref 98–111)
Creatinine, Ser: 1.22 mg/dL (ref 0.61–1.24)
GFR calc Af Amer: >60 mL/min (ref >60)
GFR calc non Af Amer: >60 mL/min (ref >60)
Glucose, Bld: 77 mg/dL (ref 70–99)
Potassium: 3.7 mmol/L (ref 3.5–5.1)
Sodium: 140 mmol/L (ref 135–145)
Total Bilirubin: 0.4 mg/dL (ref 0.3–1.2)
Total Protein: 7.8 g/dL (ref 6.5–8.1)

## 2020-02-12 NOTE — ED Triage Notes (Signed)
Pt with left groin pain that began 2 days ago. Pt appears in no acute distress. Pt denies fever, vomiting. Pt states does have some pain with urination.

## 2020-02-12 NOTE — ED Notes (Signed)
Patient given update regarding wait time, verbalized understanding. 

## 2020-02-13 ENCOUNTER — Emergency Department: Payer: Medicaid Other

## 2020-02-13 ENCOUNTER — Emergency Department
Admission: EM | Admit: 2020-02-13 | Discharge: 2020-02-13 | Disposition: A | Discharge status: Home / Self Care | Payer: Medicaid Other | Attending: Emergency Medicine | Admitting: Emergency Medicine

## 2020-02-13 DIAGNOSIS — R1032 Left lower quadrant pain: Secondary | ICD-10-CM

## 2020-02-13 DIAGNOSIS — N200 Calculus of kidney: Secondary | ICD-10-CM

## 2020-02-13 DIAGNOSIS — K5900 Constipation, unspecified: Secondary | ICD-10-CM

## 2020-02-13 MED ORDER — LACTULOSE 10 GM/15ML PO SOLN
20.0000 g | Freq: Every day | ORAL | 0 refills | Status: DC | PRN
Start: 2020-02-13 — End: 2020-12-04

## 2020-02-13 MED ORDER — SODIUM CHLORIDE 0.9 % IV BOLUS
1000.0000 mL | Freq: Once | INTRAVENOUS | Status: AC
  Administered 2020-02-13: 1000 mL via INTRAVENOUS

## 2020-02-13 MED ORDER — HYDROCODONE-ACETAMINOPHEN 5-325 MG PO TABS: 1.0000 | ORAL_TABLET | Freq: Four times a day (QID) | ORAL | 0 refills | Status: DC | PRN

## 2020-02-13 MED ORDER — KETOROLAC TROMETHAMINE 30 MG/ML IJ SOLN
10.0000 mg | Freq: Once | INTRAMUSCULAR | Status: AC
  Administered 2020-02-13: 9.9 mg via INTRAVENOUS
  Filled 2020-02-13: qty 1

## 2020-02-13 NOTE — ED Notes (Signed)
Pt signed a hard copy of the E-signature page due to pad malfunction

## 2020-02-13 NOTE — ED Notes (Signed)
Patient to stat desk in no acute distress asking about wait time. Patient given update on wait time. Patient verbalizes understanding. Patient given a warm blanket at this time.

## 2020-02-13 NOTE — ED Notes (Addendum)
Pt to CT att

## 2020-02-13 NOTE — Discharge Instructions (Addendum)
1.  Take Lactulose as needed for bowel movements. 2.  You may take Ibuprofen as needed for pain; Norco as needed for more severe pain. 3.  Return to the ER for worsening symptoms, persistent vomiting, difficulty breathing or other concerns

## 2020-02-13 NOTE — ED Provider Notes (Signed)
[facility] Emergency Department Provider Note   ____________________________________________   First MD Initiated Contact with Patient 02/13/20 [phone]     (approximate)  I have reviewed the triage vital signs and the nursing notes.   HISTORY  Chief Complaint Groin Pain    HPI Randall Parsons is a 39 y.o. male who presents to the ED from home with a chief complaint of left groin pain.  Patient reports nontraumatic and intermittent left groin pain x2 days.  Feels similarly when he had kidney stones in the past.  Kidney stones did not require lithotripsy or stenting.  Denies fever, cough, chest pain, shortness of breath, abdominal pain, nausea or vomiting.  Endorses urinary hesitancy.       Past Medical History:  Diagnosis Date  . CHF (congestive heart failure) (HCC)   . Exposure to hepatitis B   . Exposure to hepatitis C   . Hepatitis C   . 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, ending around 2017  . Hypertension   . IV drug abuse (HCC)   . Renal disorder    kidney stones    Patient Active Problem List   Diagnosis Date Noted  . Major depressive disorder 07/14/2019  . Alcohol abuse 07/14/2019  . 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 Cocaine abuse (HCC) 07/12/2019  . Chest pain 11/28/2017  . Essential hypertension 11/28/2017  . Low back pain 11/28/2017  . NICM (nonischemic cardiomyopathy) (HCC) 11/28/2017  . Nephrolithiasis 11/28/2017    Past Surgical History:  Procedure Laterality Date  . APPENDECTOMY    . EYE SURGERY     secondary to dog bite    Prior to Admission medications   Medication Sig Start Date End Date Taking? Authorizing Provider  cyclobenzaprine (FLEXERIL) 10 MG tablet Take 1 tablet (10 mg total) by mouth 2 (two) times daily as needed for muscle spasms. 08/03/19   Eustace Moore, MD  gabapentin (NEURONTIN) 300 MG capsule Take 1 capsule (300 mg total) by mouth 3 (three) times daily. 08/03/19    Eustace Moore, MD  HYDROcodone-acetaminophen (NORCO) 5-325 MG tablet Take 1 tablet by mouth every 6 (six) hours as needed for moderate pain. 02/13/20   Irean Hong, MD  lactulose (CHRONULAC) 10 GM/15ML solution Take 30 mLs (20 g total) by mouth daily as needed for mild constipation. 02/13/20   Irean Hong, MD  pantoprazole (PROTONIX) 40 MG tablet Take 1 tablet (40 mg total) by mouth daily. 07/17/19 08/03/19  Clapacs, Jackquline Denmark, MD    Allergies Amoxicillin  Family History  Problem Relation Age of Onset  . Hypertension Father     Social History Social History   Tobacco Use  . Smoking status: Former Games developer  . Smokeless tobacco: Never Used  Substance Use Topics  . Alcohol use: No  . Drug use: Yes    Types: IV, Cocaine    Comment: hx of heroin abuse (opioid addiction), crack    Review of Systems  Constitutional: No fever/chills Eyes: No visual changes. ENT: No sore throat. Cardiovascular: Denies chest pain. Respiratory: Denies shortness of breath. Gastrointestinal: No abdominal pain.  No nausea, no vomiting.  No diarrhea.  No constipation. Genitourinary: Positive for hesitancy.  Positive for left groin pain.  Negative for dysuria. Musculoskeletal: Negative for back pain. Skin: Negative for rash. Neurological: Negative for headaches, focal weakness or numbness.   ____________________________________________   PHYSICAL EXAM:  VITAL SIGNS: ED Triage Vitals  Enc Vitals Group  BP 02/12/20 2059 (!) 155/71     Pulse Rate 02/12/20 2059 72     Resp 02/12/20 2059 17     Temp 02/12/20 2059 97.8 F (36.6 C)     Temp Source 02/12/20 2059 Oral     SpO2 02/12/20 2059 99 %     Weight 02/12/20 2100 205 lb (93 kg)     Height 02/12/20 2100 5\' 9"  (1.753 m)     Head Circumference --      Peak Flow --      Pain Score 02/12/20 2107 8     Pain Loc --      Pain Edu? --      Excl. in GC? --     Constitutional: Alert and oriented. Well appearing and in no acute distress. Eyes:  Conjunctivae are normal. PERRL. EOMI. Head: Atraumatic. Nose: No congestion/rhinnorhea. Mouth/Throat: Mucous membranes are moist.  Oropharynx non-erythematous. Neck: No stridor.   Cardiovascular: Normal rate, regular rhythm. Grossly normal heart sounds.  Good peripheral circulation. Respiratory: Normal respiratory effort.  No retractions. Lungs CTAB. Gastrointestinal: Soft and nontender to light or deep palpation. No distention. No abdominal bruits. No CVA tenderness. Genitourinary: Circumcised male.  Bilaterally descended testicles which are nontender and nonswollen.  No palpable inguinal masses.  Strong bilateral cremasteric reflexes. Musculoskeletal: No lower extremity tenderness nor edema.  No joint effusions. Neurologic:  Normal speech and language. No gross focal neurologic deficits are appreciated. No gait instability. Skin:  Skin is warm, dry and intact. No rash noted. Psychiatric: Mood and affect are normal. Speech and behavior are normal.  ____________________________________________   LABS (all labs ordered are listed, but only abnormal results are displayed)  Labs Reviewed  URINALYSIS, COMPLETE (UACMP) WITH MICROSCOPIC - Abnormal; Notable for the following components:      Result Value   Color, Urine YELLOW (*)    APPearance CLOUDY (*)    All other components within normal limits  COMPREHENSIVE METABOLIC PANEL  CBC   ____________________________________________  EKG  None ____________________________________________  RADIOLOGY  ED MD interpretation: Nonobstructing bilateral renal calculi; moderate colonic stool without bowel obstruction  Official radiology report(s): CT Renal Stone Study  Result Date: 02/13/2020 CLINICAL DATA:  38 year old male with flank pain. Concern for kidney stone. EXAM: CT ABDOMEN AND PELVIS WITHOUT CONTRAST TECHNIQUE: Multidetector CT imaging of the abdomen and pelvis was performed following the standard protocol without IV contrast.  COMPARISON:  CT abdomen pelvis dated 09/12/2018. FINDINGS: Evaluation of this exam is limited in the absence of intravenous contrast. Lower chest: The visualized lung bases are clear. No intra-abdominal free air or free fluid. Hepatobiliary: No focal liver abnormality is seen. No gallstones, gallbladder wall thickening, or biliary dilatation. Pancreas: Unremarkable. No pancreatic ductal dilatation or surrounding inflammatory changes. Spleen: Normal in size without focal abnormality. Adrenals/Urinary Tract: The adrenal glands are unremarkable. Multiple nonobstructing bilateral renal calculi measure up to 5 mm. There is no hydronephrosis on either side. The visualized ureters and urinary bladder appear unremarkable. Stomach/Bowel: There is moderate stool throughout the colon. There is no bowel obstruction or active inflammation. Appendectomy. Vascular/Lymphatic: The abdominal aorta and IVC unremarkable. No portal venous gas. There is no adenopathy. Reproductive: The prostate and seminal vesicles are grossly unremarkable. No pelvic mass. Other: None Musculoskeletal: No acute or significant osseous findings. IMPRESSION: 1. Nonobstructing bilateral renal calculi. No hydronephrosis or obstructing stone. 2. Moderate colonic stool burden. No bowel obstruction. Electronically Signed   By: 14/04/2018 M.D.   On: 02/13/2020 01:10  ____________________________________________   PROCEDURES  Procedure(s) performed (including Critical Care):  Procedures   ____________________________________________   INITIAL IMPRESSION / ASSESSMENT AND PLAN / ED COURSE  As part of my medical decision making, I reviewed the following data within the [place] notes reviewed and incorporated, Labs reviewed, Old chart reviewed, Radiograph reviewed, Notes from prior ED visits and [place] Controlled Substance Database     Randall Parsons was evaluated in Emergency Department on 02/13/2020 for the symptoms  described in the history of present illness. He was evaluated in the context of the global COVID-19 pandemic, which necessitated consideration that the patient might be at risk for infection with the SARS-CoV-2 virus that causes COVID-19. Institutional protocols and algorithms that pertain to the evaluation of patients at risk for COVID-19 are in a state of rapid change based on information released by regulatory bodies including the CDC and federal and state organizations. These policies and algorithms were followed during the patient's care in the ED.    39 year old male who presents with nontraumatic left groin pain reminiscent of his prior kidney stone pain. Differential diagnosis includes, but is not limited to, acute appendicitis, renal colic, testicular torsion, urinary tract infection/pyelonephritis, prostatitis,  epididymitis, diverticulitis, small bowel obstruction or ileus, colitis, abdominal aortic aneurysm, gastroenteritis, hernia, etc.  Laboratory and urinalysis results unremarkable.  Will initiate IV fluid resuscitation, IV Toradol and proceed with CT renal colic study.   Clinical Course as of Feb 12 257  Sun Feb 13, 2020  0257 Patient was improved after IV Toradol.  Updated him on CT imaging results.  Discharged home with prescription for Lactulose and Norco to use as needed.  Strict return precautions given.  Patient verbalized understanding and agreed with plan of care.   [JS]    Clinical Course User Index [JS] Paulette Blanch, MD     ____________________________________________   FINAL CLINICAL IMPRESSION(S) / ED DIAGNOSES  Final diagnoses:  Left inguinal pain  Constipation, unspecified constipation type  Nephrolithiasis     ED Discharge Orders         Ordered    lactulose (CHRONULAC) 10 GM/15ML solution  Daily PRN     02/13/20 0118    HYDROcodone-acetaminophen (NORCO) 5-325 MG tablet  Every 6 hours PRN     02/13/20 0118           Note:  This document was  prepared using Dragon voice recognition software and may include unintentional dictation errors.   Paulette Blanch, MD 02/13/20 [phone]

## 2020-03-07 ENCOUNTER — Ambulatory Visit: Payer: Self-pay

## 2020-04-04 ENCOUNTER — Other Ambulatory Visit: Payer: Self-pay

## 2020-04-04 ENCOUNTER — Telehealth (INDEPENDENT_AMBULATORY_CARE_PROVIDER_SITE_OTHER): Payer: No Payment, Other | Admitting: Psychiatric/Mental Health

## 2020-04-04 ENCOUNTER — Encounter (HOSPITAL_COMMUNITY): Payer: Self-pay | Admitting: Psychiatric/Mental Health

## 2020-04-04 DIAGNOSIS — F1120 Opioid dependence, uncomplicated: Secondary | ICD-10-CM

## 2020-04-04 DIAGNOSIS — F1424 Cocaine dependence with cocaine-induced mood disorder: Secondary | ICD-10-CM

## 2020-04-04 DIAGNOSIS — F902 Attention-deficit hyperactivity disorder, combined type: Secondary | ICD-10-CM

## 2020-04-04 DIAGNOSIS — F331 Major depressive disorder, recurrent, moderate: Secondary | ICD-10-CM | POA: Diagnosis not present

## 2020-04-04 DIAGNOSIS — F411 Generalized anxiety disorder: Secondary | ICD-10-CM | POA: Diagnosis not present

## 2020-04-04 DIAGNOSIS — F1420 Cocaine dependence, uncomplicated: Secondary | ICD-10-CM | POA: Insufficient documentation

## 2020-04-04 MED ORDER — LAMOTRIGINE 100 MG PO TABS: 100.0000 mg | ORAL_TABLET | Freq: Two times a day (BID) | ORAL | 2 refills | Status: DC

## 2020-04-04 MED ORDER — HYDROXYZINE HCL 50 MG PO TABS: 50.0000 mg | ORAL_TABLET | Freq: Three times a day (TID) | ORAL | 0 refills | Status: DC | PRN

## 2020-04-04 MED ORDER — BUPROPION HCL ER (XL) 300 MG PO TB24: 300.0000 mg | ORAL_TABLET | Freq: Every day | ORAL | 2 refills | Status: DC

## 2020-04-04 MED ORDER — GABAPENTIN 300 MG PO CAPS: 600.0000 mg | ORAL_CAPSULE | Freq: Three times a day (TID) | ORAL | 2 refills | Status: DC

## 2020-04-04 NOTE — Progress Notes (Signed)
Psychiatric Initial Adult Assessment   Virtual Visit via Video Note  I connected with  Randall Parsons  on 04/04/20  by a video enabled telemedicine application and verified that I am speaking with the correct person using two identifiers.  Location: Patient: Home Provider: Clinic  I discussed the limitations of evaluation and management by telemedicine and the availability of in person appointments. The patient expressed understanding and agreed to proceed.  I provided 45 minutes of non-face-to-face time during this encounter.  Patient Identification: Randall Parsons MRN:  [id number] Date of Evaluation:  04/04/2020 Referral Source: [facility] Chief Complaint: "Im anxious" Visit Diagnosis:    ICD-10-CM   1. GAD (generalized anxiety disorder)  F41.1 gabapentin (NEURONTIN) 300 MG capsule    hydrOXYzine (ATARAX/VISTARIL) 50 MG tablet  2. Moderate episode of recurrent major depressive disorder (HCC)  F33.1 lamoTRIgine (LAMICTAL) 100 MG tablet    buPROPion (WELLBUTRIN XL) 300 MG 24 hr tablet  3. Attention deficit hyperactivity disorder, combined type  F90.2   4. Opioid type dependence, continuous (HCC)  F11.20   5. Cocaine dependence with cocaine-induced mood disorder (HCC)  F14.24     History of Present Illness: Randall Parsons is 39 year-old male seen today for initial psych evaluation.  Patient was referred to outpatient psychiatry by [facility] for medication management.  Patient has a diagnosis of general anxiety disorder, MDD, ADHD, opioid type dependence, and cocaine dependence with induced mood disorder, as verified through [facility] charting system.  On assessment patient reports that he feels as though his anxiety meds are not working.  He states that his depression has been well managed with Wellbutrin and Lamictal which has been verified through [facility] charting system.  His last appointment with [facility] was in March.  Patient is requesting clonazepam as he states this is the only medication that helps  with his anxiety.  Patient has a substance abuse history of cocaine use disorder and opioid use disorder.  At this time patient is being prescribed gabapentin 600 mg 3 times a day in addition to the Lamictal and the Wellbutrin, which has also been verified through [facility] charting system.  However when patient was asked about any substance abuse histories patient only mentioned that he had smoked marijuana 11 years ago.  Reminded patient that Clinical research associate was able to see his previous records both at [facility], his prior recovery facility and his hospitalizations at [facility].  Patient was hospitalized at [facility] November 2020 where it was reported that he was having suicidal thoughts and he was experiencing psychotic behavior related to cocaine usage.  Patient states that he told [facility] that he was suicidal and effort to obtain placement for drug treatment which she also states he never had a problem with.  At this time patient is requesting as mentioned previously clonazepam.  Writer has not prescribed clonazepam or any other controlled substances due to previous substance use abuse and active manipulation.  Patient has agreed to continue on current medication regimen with the addition of hydroxyzine 50 mg to be taken 3 times daily as needed for anxiety.  Patient has agreed to follow-up in 12 weeks no further concerns at this time.   Associated Signs/Symptoms: Depression Symptoms:  anxiety, (Hypo) Manic Symptoms:  Distractibility, Anxiety Symptoms:  Social Anxiety, Psychotic Symptoms:  na PTSD Symptoms: NA  Past Psychiatric History: MDD. GAD, Polysubstance abuse disorder  Previous Psychotropic Medications: No   Substance Abuse History in the last 12 months:  Yes.    Consequences of Substance Abuse:  Medical Consequences:  Inpatient psych hospitalizations  Past Medical History:  Past Medical History:  Diagnosis Date  . CHF (congestive heart failure) (HCC)   . Exposure to  hepatitis B   . Exposure to hepatitis C   . Hepatitis C   . 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, ending around 2017  . Hypertension   . IV drug abuse (HCC)   . Renal disorder    kidney stones    Past Surgical History:  Procedure Laterality Date  . APPENDECTOMY    . EYE SURGERY     secondary to dog bite    Family Psychiatric History: unknown  Family History:  Family History  Problem Relation Age of Onset  . Hypertension Father     Social History:   Social History   Socioeconomic History  . Marital status: Married    Spouse name: Not on file  . Number of children: Not on file  . Years of education: Not on file  . Highest education level: Not on file  Occupational History  . Not on file  Tobacco Use  . Smoking status: Former Games developer  . Smokeless tobacco: Never Used  Substance and Sexual Activity  . Alcohol use: No  . Drug use: Yes    Types: IV, Cocaine    Comment: hx of heroin abuse (opioid addiction), crack  . Sexual activity: Not on file  Other Topics Concern  . Not on file  Social History Narrative  . Not on file   Social Determinants of Health   Financial Resource Strain:   . Difficulty of Paying Living Expenses:   Food Insecurity:   . Worried About Programme researcher, broadcasting/film/video in the Last Year:   . Barista in the Last Year:   Transportation Needs:   . Freight forwarder (Medical):   Marland Kitchen Lack of Transportation (Non-Medical):   Physical Activity:   . Days of Exercise per Week:   . Minutes of Exercise per Session:   Stress:   . Feeling of Stress :   Social Connections:   . Frequency of Communication with Friends and Family:   . Frequency of Social Gatherings with Friends and Family:   . Attends Religious Services:   . Active Member of Clubs or Organizations:   . Attends Banker Meetings:   Marland Kitchen Marital Status:     Additional Social History: unknown  Allergies:   Allergies  Allergen Reactions  . Amoxicillin Hives     Did it involve swelling of the face/tongue/throat, SOB, or low BP? No Did it involve sudden or severe rash/hives, skin peeling, or any reaction on the inside of your mouth or nose? Yes Did you need to seek medical attention at a hospital or doctor's office? Yes When did it last happen?39 yrs old If all above answers are "NO", may proceed with cephalosporin use.     Metabolic Disorder Labs: No results found for: HGBA1C, MPG No results found for: PROLACTIN Lab Results  Component Value Date   CHOL 170 07/15/2019   TRIG 90 07/15/2019   HDL 40 (L) 07/15/2019   CHOLHDL 4.3 07/15/2019   VLDL 18 07/15/2019   LDLCALC 112 (H) 07/15/2019   Lab Results  Component Value Date   TSH 2.494 07/15/2019    Therapeutic Level Labs: No results found for: LITHIUM No results found for: CBMZ No results found for: VALPROATE  Current Medications: Current Outpatient Medications  Medication Sig Dispense Refill  .  buPROPion (WELLBUTRIN XL) 300 MG 24 hr tablet Take 1 tablet (300 mg total) by mouth daily. 30 tablet 2  . cyclobenzaprine (FLEXERIL) 10 MG tablet Take 1 tablet (10 mg total) by mouth 2 (two) times daily as needed for muscle spasms. 60 tablet 2  . gabapentin (NEURONTIN) 300 MG capsule Take 2 capsules (600 mg total) by mouth 3 (three) times daily. 180 capsule 2  . HYDROcodone-acetaminophen (NORCO) 5-325 MG tablet Take 1 tablet by mouth every 6 (six) hours as needed for moderate pain. 15 tablet 0  . hydrOXYzine (ATARAX/VISTARIL) 50 MG tablet Take 1 tablet (50 mg total) by mouth 3 (three) times daily as needed. 90 tablet 0  . lactulose (CHRONULAC) 10 GM/15ML solution Take 30 mLs (20 g total) by mouth daily as needed for mild constipation. 120 mL 0  . lamoTRIgine (LAMICTAL) 100 MG tablet Take 1 tablet (100 mg total) by mouth 2 (two) times daily. 60 tablet 2   No current facility-administered medications for this visit.    Musculoskeletal: Strength & Muscle Tone: within normal  limits Gait & Station: normal Patient leans: N/A  Psychiatric Specialty Exam: Review of Systems  Psychiatric/Behavioral: Negative for confusion and decreased concentration. The patient is nervous/anxious.   All other systems reviewed and are negative.   There were no vitals taken for this visit.There is no height or weight on file to calculate BMI.  General Appearance: Casual  Eye Contact:  Fair  Speech:  Clear and Coherent  Volume:  Normal  Mood:  Anxious  Affect:  Congruent  Thought Process:  Coherent and Descriptions of Associations: Intact  Orientation:  Full (Time, Place, and Person)  Thought Content:  WDL and Logical  Suicidal Thoughts:  No  Homicidal Thoughts:  No  Memory:  Immediate;   Fair  Judgement:  Fair  Insight:  Fair  Psychomotor Activity:  Normal  Concentration:  Attention Span: Fair  Recall:  [employer] of Knowledge:Fair  Language: NA  Akathisia:  NA  Handed:  Right  AIMS (if indicated):  not done  Assets:  Communication Skills Desire for Improvement Social Support  ADL's:  Intact  Cognition: WNL  Sleep:  Fair   Screenings: AUDIT     Admission (Discharged) from 07/14/2019 in [facility] INPATIENT BEHAVIORAL MEDICINE  Alcohol Use Disorder Identification Test Final Score (AUDIT) 21      Assessment and Plan: He is agreeable to continue medications as prescribed and requested medication refills. Patient agrees to follow up in 12 weeks.   ICD-10-CM   1. GAD (generalized anxiety disorder)  F41.1 gabapentin (NEURONTIN) 300 MG capsule    hydrOXYzine (ATARAX/VISTARIL) 50 MG tablet  2. Moderate episode of recurrent major depressive disorder (HCC)  F33.1 lamoTRIgine (LAMICTAL) 100 MG tablet    buPROPion (WELLBUTRIN XL) 300 MG 24 hr tablet  3. Attention deficit hyperactivity disorder, combined type  F90.2   4. Opioid type dependence, continuous (HCC)  F11.20   5. Cocaine dependence with cocaine-induced mood disorder (HCC)  F14.24     Jearld Lesch,  NP 6/29/20211:41 PM

## 2020-04-04 NOTE — Addendum Note (Signed)
Addended by: Lerry Liner on: 04/06/2020 01:30 PM     Modules accepted: Orders      Electronically signed by Jearld Lesch, NP at 04/06/2020  1:30 PM EDT

## 2020-04-06 ENCOUNTER — Telehealth (HOSPITAL_COMMUNITY): Payer: Self-pay | Admitting: *Deleted

## 2020-04-06 MED ORDER — GABAPENTIN 300 MG PO CAPS: 600.0000 mg | ORAL_CAPSULE | Freq: Three times a day (TID) | ORAL | 2 refills | Status: DC

## 2020-04-06 MED ORDER — LAMOTRIGINE 100 MG PO TABS: 100.0000 mg | ORAL_TABLET | Freq: Two times a day (BID) | ORAL | 2 refills | Status: DC

## 2020-04-06 MED ORDER — HYDROXYZINE HCL 50 MG PO TABS: 50.0000 mg | ORAL_TABLET | Freq: Three times a day (TID) | ORAL | 0 refills | Status: AC | PRN

## 2020-04-06 MED ORDER — BUPROPION HCL ER (XL) 300 MG PO TB24: 300.0000 mg | ORAL_TABLET | Freq: Every day | ORAL | 2 refills | Status: DC

## 2020-04-06 NOTE — Telephone Encounter (Signed)
Seen on June 29 th and checked with his pharmacy but medications not there. He wanted them to be called into [facility] in [place] but pharm listed on his chart and provider called them into [place]. He stated there were some technical difficulties when he was virtually meeting with the provider and she may not have heard he wanted pharmacy changed. Will reach out to provider to have her call in new rx to [facility].

## 2020-04-06 NOTE — Addendum Note (Signed)
Addended by: Lerry Liner on: 04/06/2020 01:30 PM   Modules accepted: Orders

## 2020-05-19 ENCOUNTER — Ambulatory Visit (HOSPITAL_COMMUNITY)
Admission: EM | Admit: 2020-05-19 | Discharge: 2020-05-19 | Disposition: A | Discharge status: Home / Self Care | Payer: 59 | Attending: Urgent Care | Admitting: Urgent Care

## 2020-05-19 ENCOUNTER — Other Ambulatory Visit: Payer: Self-pay

## 2020-05-19 DIAGNOSIS — Z1152 Encounter for screening for COVID-19: Secondary | ICD-10-CM | POA: Diagnosis not present

## 2020-05-19 LAB — SARS CORONAVIRUS 2 (TAT 6-24 HRS): SARS Coronavirus 2: NEGATIVE

## 2020-05-19 NOTE — ED Triage Notes (Signed)
Pt presents for COVID test after possible exposure. Denies any COVID symptoms.

## 2020-06-30 ENCOUNTER — Other Ambulatory Visit: Payer: Self-pay

## 2020-06-30 ENCOUNTER — Telehealth (HOSPITAL_COMMUNITY): Payer: 59

## 2020-07-04 ENCOUNTER — Ambulatory Visit: Payer: Medicaid Other | Admitting: Internal Medicine

## 2020-09-08 ENCOUNTER — Ambulatory Visit: Payer: Medicaid Other | Admitting: Internal Medicine

## 2020-12-03 ENCOUNTER — Ambulatory Visit (HOSPITAL_COMMUNITY)
Admission: EM | Admit: 2020-12-03 | Discharge: 2020-12-04 | Disposition: A | Discharge status: Home / Self Care | Payer: 59 | Attending: Family | Admitting: Family

## 2020-12-03 ENCOUNTER — Other Ambulatory Visit: Payer: Self-pay

## 2020-12-03 DIAGNOSIS — F1414 Cocaine abuse with cocaine-induced mood disorder: Secondary | ICD-10-CM | POA: Insufficient documentation

## 2020-12-03 DIAGNOSIS — F338 Other recurrent depressive disorders: Secondary | ICD-10-CM | POA: Insufficient documentation

## 2020-12-03 DIAGNOSIS — F1420 Cocaine dependence, uncomplicated: Secondary | ICD-10-CM

## 2020-12-03 DIAGNOSIS — F329 Major depressive disorder, single episode, unspecified: Secondary | ICD-10-CM | POA: Diagnosis present

## 2020-12-03 DIAGNOSIS — Z20822 Contact with and (suspected) exposure to covid-19: Secondary | ICD-10-CM | POA: Insufficient documentation

## 2020-12-03 DIAGNOSIS — F331 Major depressive disorder, recurrent, moderate: Secondary | ICD-10-CM | POA: Diagnosis not present

## 2020-12-03 LAB — LIPID PANEL
Cholesterol: 158 mg/dL (ref 0–200)
HDL: 44 mg/dL (ref >40)
LDL Cholesterol: 104 mg/dL — ABNORMAL HIGH (ref 0–99)
Total CHOL/HDL Ratio: 3.6 RATIO
Triglycerides: 49 mg/dL (ref <150)
VLDL: 10 mg/dL (ref 0–40)

## 2020-12-03 LAB — CBC WITH DIFFERENTIAL/PLATELET
Abs Immature Granulocytes: 0.01 10*3/uL (ref 0.00–0.07)
Basophils Absolute: 0 10*3/uL (ref 0.0–0.1)
Basophils Relative: 0 %
Eosinophils Absolute: 0.1 10*3/uL (ref 0.0–0.5)
Eosinophils Relative: 2 %
HCT: 44.6 % (ref 39.0–52.0)
Hemoglobin: 14.8 g/dL (ref 13.0–17.0)
Immature Granulocytes: 0 %
Lymphocytes Relative: 30 %
Lymphs Abs: 1.8 10*3/uL (ref 0.7–4.0)
MCH: 29.9 pg (ref 26.0–34.0)
MCHC: 33.2 g/dL (ref 30.0–36.0)
MCV: 90.1 fL (ref 80.0–100.0)
Monocytes Absolute: 0.8 10*3/uL (ref 0.1–1.0)
Monocytes Relative: 13 %
Neutro Abs: 3.3 10*3/uL (ref 1.7–7.7)
Neutrophils Relative %: 55 %
Platelets: 254 10*3/uL (ref 150–400)
RBC: 4.95 MIL/uL (ref 4.22–5.81)
RDW: 12.8 % (ref 11.5–15.5)
WBC: 6 10*3/uL (ref 4.0–10.5)
nRBC: 0 % (ref 0.0–0.2)

## 2020-12-03 LAB — RESP PANEL BY RT-PCR (FLU A&B, COVID) ARPGX2
Influenza A by PCR: NEGATIVE
Influenza B by PCR: NEGATIVE
SARS Coronavirus 2 by RT PCR: NEGATIVE

## 2020-12-03 LAB — COMPREHENSIVE METABOLIC PANEL
ALT: 34 U/L (ref 0–44)
AST: 46 U/L — ABNORMAL HIGH (ref 15–41)
Albumin: 4.2 g/dL (ref 3.5–5.0)
Alkaline Phosphatase: 92 U/L (ref 38–126)
Anion gap: 11 (ref 5–15)
BUN: 10 mg/dL (ref 6–20)
CO2: 26 mmol/L (ref 22–32)
Calcium: 9.1 mg/dL (ref 8.9–10.3)
Chloride: 101 mmol/L (ref 98–111)
Creatinine, Ser: 1.13 mg/dL (ref 0.61–1.24)
GFR, Estimated: >60 mL/min (ref >60)
Glucose, Bld: 96 mg/dL (ref 70–99)
Potassium: 3.7 mmol/L (ref 3.5–5.1)
Sodium: 138 mmol/L (ref 135–145)
Total Bilirubin: 0.7 mg/dL (ref 0.3–1.2)
Total Protein: 7.4 g/dL (ref 6.5–8.1)

## 2020-12-03 LAB — HEMOGLOBIN A1C
Hgb A1c MFr Bld: 5.8 % — ABNORMAL HIGH (ref 4.8–5.6)
Mean Plasma Glucose: 119.76 mg/dL

## 2020-12-03 LAB — ETHANOL: Alcohol, Ethyl (B): <10 mg/dL (ref <10)

## 2020-12-03 LAB — TSH: TSH: 2.277 u[IU]/mL (ref 0.350–4.500)

## 2020-12-03 LAB — MAGNESIUM: Magnesium: 2.3 mg/dL (ref 1.7–2.4)

## 2020-12-03 MED ORDER — LAMOTRIGINE 100 MG PO TABS
100.0000 mg | ORAL_TABLET | Freq: Two times a day (BID) | ORAL | Status: DC
  Administered 2020-12-03 – 2020-12-04 (×2): 100 mg via ORAL
  Filled 2020-12-03 (×2): qty 1

## 2020-12-03 MED ORDER — MAGNESIUM HYDROXIDE 400 MG/5ML PO SUSP: 30.0000 mL | Freq: Every day | ORAL | Status: DC | PRN

## 2020-12-03 MED ORDER — TRAZODONE HCL 50 MG PO TABS
50.0000 mg | ORAL_TABLET | Freq: Every evening | ORAL | Status: DC | PRN
  Administered 2020-12-03: 50 mg via ORAL
  Filled 2020-12-03: qty 1

## 2020-12-03 MED ORDER — ALUM & MAG HYDROXIDE-SIMETH 200-200-20 MG/5ML PO SUSP: 30.0000 mL | ORAL | Status: DC | PRN

## 2020-12-03 MED ORDER — BUPROPION HCL ER (XL) 300 MG PO TB24
300.0000 mg | ORAL_TABLET | Freq: Every day | ORAL | Status: DC
  Administered 2020-12-03 – 2020-12-04 (×2): 300 mg via ORAL
  Filled 2020-12-03 (×2): qty 1

## 2020-12-03 MED ORDER — ACETAMINOPHEN 325 MG PO TABS: 650.0000 mg | ORAL_TABLET | Freq: Four times a day (QID) | ORAL | Status: DC | PRN

## 2020-12-03 MED ORDER — HYDROXYZINE HCL 25 MG PO TABS
25.0000 mg | ORAL_TABLET | Freq: Three times a day (TID) | ORAL | Status: DC | PRN
  Administered 2020-12-03 (×2): 25 mg via ORAL
  Filled 2020-12-03 (×2): qty 1

## 2020-12-03 NOTE — ED Notes (Signed)
Pt is sleeping@this  time. Breathing even and unlabored. Will continue to monitor for safety. Will continue to monitor for safety

## 2020-12-03 NOTE — ED Notes (Signed)
Pt sleeping@this time. breathing even and unlabored. Will continue to monitor for safety 
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
## 2020-12-03 NOTE — ED Notes (Signed)
Pt alert and oriented during [facility] admission process. Pt denies SI/HI, A/VH, and any pain. ---Pt is cooperative. Education, support, reassurance, and encouragement provided, q15 minute safety checks initiated. Pt denies any concerns at this time, and verbally contracts for safety. Pt ambulating on the unit with no issues. Pt remains safe on the unit.

## 2020-12-03 NOTE — ED Provider Notes (Signed)
Behavioral Health Admission [facility] & OBS)  Date: 12/03/20 Patient Name: Randall Parsons MRN: [id number] Chief Complaint: No chief complaint on file.     Diagnoses:  Final diagnoses:  Moderate episode of recurrent major depressive disorder [facility])    HPI: Patient presents to [facility] behavioral health under involuntary commitment.  Petition reads: "Respondent is delusional, hallucinating, paranoid and off his meds.  Prior history and mental illness diagnosis.  He is a danger to himself."  Patient assessed by nurse practitioner.  Patient alert and oriented.  Patient with slurred speech, delaying at times.  Patient reports he has been sleeping only 2 to 3 hours per night for 4 days.  Patient reports he believes this may be related to his outpatient psychiatrist "tapering me off of diazepam."  Patient reports last dose of diazepam approximately 2 days ago.  Patient endorses history of paranoia.  Patient denies suicidal and homicidal ideations.  Patient reports 1 prior suicide attempt when he was 40 years old and cut his wrist.  Patient denies auditory and visual hallucinations.  There is no evidence of delusional thought content and no indication the patient is responding to internal stimuli.  Patient reports his wife has verbalized concerns related to his medication.  Reports "my wife believes the medication is making me crazy."  Recent stressor includes his wife asking patient for divorce.  Patient reports this is the second time they have considered divorce and they are currently seeing a marriage counselor.  Patient reports he moved back into the marital home 2 months ago after separation.  Patient is diagnosed with major depressive disorder, generalized anxiety disorder and ADHD.  Patient has also been diagnosed with alcohol-induced mood disorder, opioid dependence and cocaine abuse in the past.  Patient is seen outpatient by psychiatrist and McDonald and therapist Alexia through  [facility] health.  Patient reports his current medications include Lamictal 100 mg twice daily and Wellbutrin 300 mg daily.  Patient reports compliance with medications, last dose of medications today.  Patient resides in [place] with his wife.  Patient denies access to weapons.  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  Patient denies alcohol use.  Patient reports history of substance use.  Drug of choice crack cocaine, last use 90 days ago.  Patient endorses average appetite.  Patient offered support and encouragement.  PHQ 2-9:   Flowsheet Row Admission (Discharged) from 07/14/2019 in [facility] INPATIENT BEHAVIORAL MEDICINE ED from 07/10/2019 in [facility] EMERGENCY DEPARTMENT  C-SSRS RISK CATEGORY Error: Q3, 4, or 5 should not be populated when Q2 is No Moderate Risk       Total Time spent with patient: 30 minutes  Musculoskeletal  Strength & Muscle Tone: within normal limits Gait & Station: normal Patient leans: N/A  Psychiatric Specialty Exam  Presentation General Appearance: Appropriate for Environment; Casual  Eye Contact:Minimal  Speech:Slow; Slurred  Speech Volume:Normal  Handedness:Right   Mood and Affect  Mood:Euthymic  Affect:Congruent   Thought Process  Thought Processes:Coherent; Goal Directed  Descriptions of Associations:Intact  Orientation:Full (Time, Place and Person)  Thought Content:Logical; WDL   Hallucinations:Hallucinations: None  Ideas of Reference:None  Suicidal Thoughts:Suicidal Thoughts: No  Homicidal Thoughts:Homicidal Thoughts: No   Sensorium  Memory:Immediate Fair; Recent Fair; Remote Fair  Judgment:Fair  Insight:Fair   Executive Functions  Concentration:Fair  Attention Span:Fair  Recall:Fair  Fund of Knowledge:Fair  Language:Fair   Psychomotor Activity  Psychomotor Activity:Psychomotor Activity: Normal   Assets  Assets:Communication Skills; Desire for Improvement; Financial  Resources/Insurance;  Housing; Intimacy; Leisure Time; Physical Health; Resilience; Social Support; Transportation; Talents/Skills   Sleep  Sleep:Sleep: Poor Number of Hours of Sleep: 2   Nutritional Assessment (For OBS and [facility] admissions only) Has the patient had a weight loss or gain of 10 pounds or more in the last 3 months?: No Has the patient had a decrease in food intake/or appetite?: No Does the patient have dental problems?: No Does the patient have eating habits or behaviors that may be indicators of an eating disorder including binging or inducing vomiting?: No Has the patient recently lost weight without trying?: No Has the patient been eating poorly because of a decreased appetite?: No Malnutrition Screening Tool Score: 0    Physical Exam Vitals and nursing note reviewed.  Constitutional:      Appearance: He is well-developed.  HENT:     Head: Normocephalic.  Cardiovascular:     Rate and Rhythm: Normal rate.  Pulmonary:     Effort: Pulmonary effort is normal.  Neurological:     Mental Status: [name] to person, place, and time.  Psychiatric:        Attention and Perception: Attention and perception normal.        Mood and Affect: Mood and affect normal.        Speech: Speech is slurred.        Behavior: Behavior normal. Behavior is cooperative.        Thought Content: Thought content normal.        Cognition and Memory: Cognition and memory normal.    Review of Systems  Constitutional: Negative.   HENT: Negative.   Eyes: Negative.   Respiratory: Negative.   Cardiovascular: Negative.   Gastrointestinal: Negative.   Genitourinary: Negative.   Musculoskeletal: Negative.   Skin: Negative.   Neurological: Negative.   Endo/Heme/Allergies: Negative.   Psychiatric/Behavioral: Positive for substance abuse. The patient has insomnia.     Pulse 72, temperature 98.3 F (36.8 C), temperature source Oral, resp. rate 16, SpO2 97 %. There is no  height or weight on file to calculate BMI.  Past Psychiatric History: GAD, MDD, ADHD, opioid dependence, cocaine use disorder, alcohol induced mood disorder  Is the patient at risk to self? No  Has the patient been a risk to self in the past 6 months? No .    Has the patient been a risk to self within the distant past? Yes   Is the patient a risk to others? No   Has the patient been a risk to others in the past 6 months? No   Has the patient been a risk to others within the distant past? No   Past Medical History:  Past Medical History:  Diagnosis Date  . CHF (congestive heart failure) (HCC)   . Exposure to hepatitis B   . Exposure to hepatitis C   . Hepatitis C   . 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, ending around 2017  . Hypertension   . IV drug abuse (HCC)   . Renal disorder    kidney stones    Past Surgical History:  Procedure Laterality Date  . APPENDECTOMY    . EYE SURGERY     secondary to dog bite    Family History:  Family History  Problem Relation Age of Onset  . Hypertension Father     Social History:  Social History   Socioeconomic History  . Marital status: Married    Spouse  name: Not on file  . Number of children: Not on file  . Years of education: Not on file  . Highest education level: Not on file  Occupational History  . Not on file  Tobacco Use  . Smoking status: Former Games developer  . Smokeless tobacco: Never Used  Substance and Sexual Activity  . Alcohol use: No  . Drug use: Yes    Types: IV, Cocaine    Comment: hx of heroin abuse (opioid addiction), crack  . Sexual activity: Not on file  Other Topics Concern  . Not on file  Social History Narrative  . Not on file   Social Determinants of Health   Financial Resource Strain: Not on file  Food Insecurity: Not on file  Transportation Needs: Not on file  Physical Activity: Not on file  Stress: Not on file  Social Connections: Not on file  Intimate Partner Violence: Not  on file    SDOH:  SDOH Screenings   Alcohol Screen: Not on file  Depression (ALP3-7): Not on file  Financial Resource Strain: Not on file  Food Insecurity: Not on file  Housing: Not on file  Physical Activity: Not on file  Social Connections: Not on file  Stress: Not on file  Tobacco Use: Medium Risk  . Smoking Tobacco Use: Former Smoker  . Smokeless Tobacco Use: Never Used  Transportation Needs: Not on file    Last Labs:  No visits with results within 6 Month(s) from this visit.  Latest known visit with results is:  Admission on 05/19/2020, Discharged on 05/19/2020  Component Date Value Ref Range Status  . SARS Coronavirus 2 05/19/2020 NEGATIVE  NEGATIVE Final   Comment: (NOTE) SARS-CoV-2 target nucleic acids are NOT DETECTED.  The SARS-CoV-2 RNA is generally detectable in upper and lower respiratory specimens during the acute phase of infection. Negative results do not preclude SARS-CoV-2 infection, do not rule out co-infections with other pathogens, and should not be used as the sole basis for treatment or other patient management decisions. Negative results must be combined with clinical observations, patient history, and epidemiological information. The expected result is Negative.  Fact Sheet for Patients: HairSlick.no  Fact Sheet for Healthcare Providers: quierodirigir.com  This test is not yet approved or cleared by the [place] FDA and  has been authorized for detection and/or diagnosis of SARS-CoV-2 by FDA under an Emergency Use Authorization (EUA). This EUA will remain  in effect (meaning this test can be used) for the duration of the COVID-19 declaration under Se                          ction 564(b)(1) of the Act, 21 U.S.C. section 360bbb-3(b)(1), unless the authorization is terminated or revoked sooner.  Performed at [facility], [address] [place], [place] [address]      Allergies: Amoxicillin  PTA Medications: (Not in a hospital admission)   Medical Decision Making  Patient reviewed with Dr. Jannifer Franklin.  Patient will be placed in continuous assessment area at [facility] for treatment and stabilization. Discussed restarting home medications including: -Bupropion 300 mg daily -Lamictal 100 mg twice daily  Also initiated medications: -Hydroxyzine 25 mg 3 times daily as needed/anxiety -Trazodone 50 mg nightly as needed/sleep  Recommendations  Based on my evaluation the patient does not appear to have an emergency medical condition.  Patrcia Dolly, FNP 12/03/20  4:35 PM

## 2020-12-03 NOTE — ED Notes (Signed)
Formatting of this note might be different from the original.  Pt alert and oriented during [facility] admission process. Pt denies SI/HI, A/VH, and any pain. ---Pt is cooperative. Education, support, reassurance, and encouragement provided, q15 minute safety checks initiated. Pt denies any concerns at this time, and verbally contracts for safety. Pt ambulating on the unit with no issues. Pt remains safe on the unit.    Electronically signed by Tania Ade, RN at 12/03/2020  6:38 PM EST

## 2020-12-03 NOTE — ED Notes (Signed)
Formatting of this note might be different from the original.  Pt is sleeping@this  time. Breathing even and unlabored. Will continue to monitor for safety. Will continue to monitor for safety  Electronically signed by Maryln Manuel, LPN at 16/07/9603  8:01 PM EST
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
## 2020-12-03 NOTE — ED Provider Notes (Signed)
Formatting of this note is different from the original.  Behavioral Health Admission H&P  [facility] OBS)    Date: 12/03/20  Patient Name: Lucas Hicks  MRN: [id number]  Chief Complaint: No chief complaint on file.      Diagnoses:   Final diagnoses:   Moderate episode of recurrent major depressive disorder [facility])     HPI: Patient presents to [facility] behavioral health under involuntary commitment.    Petition reads: "Respondent is delusional, hallucinating, paranoid and off his meds.  Prior history and mental illness diagnosis.  He is a danger to himself."    Patient assessed by nurse practitioner.  Patient alert and oriented.  Patient with slurred speech, delaying at times.  Patient reports he has been sleeping only 2 to 3 hours per night for 4 days.  Patient reports he believes this may be related to his outpatient psychiatrist "tapering me off of diazepam."  Patient reports last dose of diazepam approximately 2 days ago.  Patient endorses history of paranoia.    Patient denies suicidal and homicidal ideations.  Patient reports 1 prior suicide attempt when he was 40 years old and cut his wrist.  Patient denies auditory and visual hallucinations.  There is no evidence of delusional thought content and no indication the patient is responding to internal stimuli.    Patient reports his wife has verbalized concerns related to his medication.  Reports "my wife believes the medication is making me crazy."    Recent stressor includes his wife asking patient for divorce.  Patient reports this is the second time they have considered divorce and they are currently seeing a marriage counselor.  Patient reports he moved back into the marital home 2 months ago after separation.    Patient is diagnosed with major depressive disorder, generalized anxiety disorder and ADHD.  Patient has also been diagnosed with alcohol-induced mood disorder, opioid dependence and cocaine abuse in the past.  Patient is seen outpatient by  psychiatrist and McDonald and therapist Alexia through [facility] health.  Patient reports his current medications include Lamictal 100 mg twice daily and Wellbutrin 300 mg daily.  Patient reports compliance with medications, last dose of medications today.    Patient resides in [place] with his wife.  Patient denies access to weapons.  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  Patient denies alcohol use.  Patient reports history of substance use.  Drug of choice crack cocaine, last use 90 days ago.  Patient endorses average appetite.    Patient offered support and encouragement.    PHQ 2-9:    Flowsheet Row Admission (Discharged) from 07/14/2019 in [facility] INPATIENT BEHAVIORAL MEDICINE ED from 07/10/2019 in [facility] EMERGENCY DEPARTMENT   C-SSRS RISK CATEGORY Error: Q3, 4, or 5 should not be populated when Q2 is No Moderate Risk         Total Time spent with patient: 30 minutes    Musculoskeletal   Strength & Muscle Tone: within normal limits  Gait & Station: normal  Patient leans: N/A    Psychiatric Specialty Exam   Presentation  General Appearance: Appropriate for Environment; Casual    Eye Contact:Minimal    Speech:Slow; Slurred    Speech Volume:Normal    Handedness:Right    Mood and Affect   Mood:Euthymic    Affect:Congruent    Thought Process   Thought Processes:Coherent; Goal Directed    Descriptions of Associations:Intact    Orientation:Full (Time, Place and Person)    Thought Content:Logical;  WDL    Hallucinations:Hallucinations: None    Ideas of Reference:None    Suicidal Thoughts:Suicidal Thoughts: No    Homicidal Thoughts:Homicidal Thoughts: No    Sensorium   Memory:Immediate Fair; Recent Fair; Remote Fair    Judgment:Fair    Insight:Fair    Executive Functions   Concentration:Fair    Attention Span:Fair    Recall:Fair    Fund of Knowledge:Fair    Language:Fair    Psychomotor Activity   Psychomotor Activity:Psychomotor Activity: Normal    Assets   Assets:Communication Skills; Desire for  Improvement; Financial Resources/Insurance; Housing; Intimacy; Leisure Time; Physical Health; Resilience; Social Support; Transportation; Talents/Skills    Sleep   Sleep:Sleep: Poor  Number of Hours of Sleep: 2    Nutritional Assessment (For OBS and FBC admissions only)  Has the patient had a weight loss or gain of 10 pounds or more in the last 3 months?: No  Has the patient had a decrease in food intake/or appetite?: No  Does the patient have dental problems?: No  Does the patient have eating habits or behaviors that may be indicators of an eating disorder including binging or inducing vomiting?: No  Has the patient recently lost weight without trying?: No  Has the patient been eating poorly because of a decreased appetite?: No  Malnutrition Screening Tool Score: 0    Physical Exam  Vitals and nursing note reviewed.   Constitutional:       Appearance: He is well-developed.   HENT:      Head: Normocephalic.   Cardiovascular:      Rate and Rhythm: Normal rate.   Pulmonary:      Effort: Pulmonary effort is normal.   Neurological:      Mental Status: [name] to person, place, and time.   Psychiatric:         Attention and Perception: Attention and perception normal.         Mood and Affect: Mood and affect normal.         Speech: Speech is slurred.         Behavior: Behavior normal. Behavior is cooperative.         Thought Content: Thought content normal.         Cognition and Memory: Cognition and memory normal.     Review of Systems   Constitutional: Negative.    HENT: Negative.    Eyes: Negative.    Respiratory: Negative.    Cardiovascular: Negative.    Gastrointestinal: Negative.    Genitourinary: Negative.    Musculoskeletal: Negative.    Skin: Negative.    Neurological: Negative.    Endo/Heme/Allergies: Negative.    Psychiatric/Behavioral: Positive for substance abuse. The patient has insomnia.      Pulse 72, temperature 98.3 F (36.8 C), temperature source Oral, resp. rate 16, SpO2 97 %. There  is no height or weight on file to calculate BMI.    Past Psychiatric History: GAD, MDD, ADHD, opioid dependence, cocaine use disorder, alcohol induced mood disorder    Is the patient at risk to self? No   Has the patient been a risk to self in the past 6 months? No .     Has the patient been a risk to self within the distant past? Yes    Is the patient a risk to others? No    Has the patient been a risk to others in the past 6 months? No    Has the patient been a  risk to others within the distant past? No     Past Medical History:   Past Medical History:   Diagnosis Date   ? CHF (congestive heart failure) (HCC)    ? Exposure to hepatitis B    ? Exposure to hepatitis C    ? Hepatitis C    ? History of opioid abuse (HCC)     reports heroin abuse, ending around 2017   ? Hypertension    ? IV drug abuse (HCC)    ? Renal disorder     kidney stones     Past Surgical History:   Procedure Laterality Date   ? APPENDECTOMY     ? EYE SURGERY      secondary to dog bite     Family History:   Family History   Problem Relation Age of Onset   ? Hypertension Father      Social History:   Social History     Socioeconomic History   ? Marital status: Married     Spouse name: Not on file   ? Number of children: Not on file   ? Years of education: Not on file   ? Highest education level: Not on file   Occupational History   ? Not on file   Tobacco Use   ? Smoking status: Former Smoker   ? Smokeless tobacco: Never Used   Substance and Sexual Activity   ? Alcohol use: No   ? Drug use: Yes     Types: IV, Cocaine     Comment: hx of heroin abuse (opioid addiction), crack   ? Sexual activity: Not on file   Other Topics Concern   ? Not on file   Social History Narrative   ? Not on file     Social Determinants of Health     Financial Resource Strain: Not on file   Food Insecurity: Not on file   Transportation Needs: Not on file   Physical Activity: Not on file   Stress: Not on file   Social Connections: Not on file   Intimate Partner Violence:  Not on file     SDOH:   SDOH Screenings     Alcohol Screen: Not on file   Depression (NGE9-5): Not on file   Financial Resource Strain: Not on file   Food Insecurity: Not on file   Housing: Not on file   Physical Activity: Not on file   Social Connections: Not on file   Stress: Not on file   Tobacco Use: Medium Risk   ? Smoking Tobacco Use: Former Smoker   ? Smokeless Tobacco Use: Never Used   Transportation Needs: Not on file     Last Labs:   No visits with results within 6 Month(s) from this visit.   Latest known visit with results is:   Admission on 05/19/2020, Discharged on 05/19/2020   Component Date Value Ref Range Status   ? SARS Coronavirus 2 05/19/2020 NEGATIVE  NEGATIVE Final    Comment: (NOTE)  SARS-CoV-2 target nucleic acids are NOT DETECTED.    The SARS-CoV-2 RNA is generally detectable in upper and lower  respiratory specimens during the acute phase of infection. Negative  results do not preclude SARS-CoV-2 infection, do not rule out  co-infections with other pathogens, and should not be used as the  sole basis for treatment or other patient management decisions.  Negative results must be combined with clinical observations,  patient history, and epidemiological information. The expected  result is Negative.    Fact Sheet for Patients:  HairSlick.no    Fact Sheet for Healthcare Providers:  quierodirigir.com    This test is not yet approved or cleared by the [place] FDA and   has been authorized for detection and/or diagnosis of SARS-CoV-2 by  FDA under an Emergency Use Authorization (EUA). This EUA will remain   in effect (meaning this test can be used) for the duration of the  COVID-19 declaration under Se                           ction 564(b)(1) of the Act, 21 U.S.C.  section 360bbb-3(b)(1), unless the authorization is terminated or  revoked sooner.    Performed at [facility], [address], [place],  [place]  [address]      Allergies: Amoxicillin    PTA Medications: (Not in a hospital admission)    Medical Decision Making   Patient reviewed with Dr. Jannifer Franklin.  Patient will be placed in continuous assessment area at [facility] for treatment and stabilization.  Discussed restarting home medications including:  -Bupropion 300 mg daily  -Lamictal 100 mg twice daily    Also initiated medications:  -Hydroxyzine 25 mg 3 times daily as needed/anxiety  -Trazodone 50 mg nightly as needed/sleep    Recommendations   Based on my evaluation the patient does not appear to have an emergency medical condition.    Patrcia Dolly, FNP  12/03/20  4:35 PM  Electronically signed by Thedore Mins, MD at 12/09/2020 11:12 AM EST

## 2020-12-03 NOTE — ED Notes (Signed)
Formatting of this note might be different from the original.  Pt sleeping@this  time. breathing even and unlabored. Will continue to monitor for safety  Electronically signed by Maryln Manuel, LPN at 16/07/9603 11:08 PM EST

## 2020-12-04 DIAGNOSIS — F1414 Cocaine abuse with cocaine-induced mood disorder: Secondary | ICD-10-CM

## 2020-12-04 DIAGNOSIS — F331 Major depressive disorder, recurrent, moderate: Secondary | ICD-10-CM | POA: Diagnosis not present

## 2020-12-04 MED ORDER — LAMOTRIGINE 100 MG PO TABS: 100.0000 mg | ORAL_TABLET | Freq: Two times a day (BID) | ORAL | 2 refills | Status: DC

## 2020-12-04 NOTE — ED Notes (Signed)
Educated pt on avs and medications. Verbalized understanding. Escorted to retrieve belongings. Ambulated per self. No s/s pain, discomfort, or acute distress. No SI, HI, or AVH noted. A&O x4. Escorted out back [place] to safe transport for transportation home. Stable at time of d/c

## 2020-12-04 NOTE — Discharge Instructions (Addendum)

## 2020-12-04 NOTE — ED Notes (Signed)
Resting with eyes closed. Rise and fall of chest noted. No new issues noted at this time. Will continue to monitor for safety 

## 2020-12-04 NOTE — ED Provider Notes (Signed)
FBC/OBS ASAP Discharge Summary  Date and Time: 12/04/2020 11:51 AM  Name: Randall Parsons  MRN:  [id number]   Discharge Diagnoses:  Final diagnoses:  Moderate episode of recurrent major depressive disorder (HCC)  Cocaine abuse with cocaine-induced mood disorder (HCC)    Subjective: Patient reports today that he is feeling better.  Patient denies having any suicidal homicidal ideations and denies any hallucinations.  Patient family admits that he had relapsed on crack cocaine and abuse for 3 to 4 days.  He states that he does remember having hallucinations and was appearing to be paranoid at that time and reports that it is due to the substance use.  He reports that his family does not do this and that is probably why they had him placed under IVC because they were unaware of the substance use causing these issues.  He reports that he would like for his wife not to know because they are already having enough marital problems.  Patient reports that he has has a long history of polysubstance abuse and usually stay sober for 90 days to 6 months before he relapses again.  Patient states that he would like to discharge home today and that he will contact his wife to come and pick him up.  Stay Summary: Patient is a 40 year old male who presented to the [facility] under IVC due to being delusional, hallucinating, and paranoid and off of his psychiatric medications.  Patient was admitted to the continuous observation unit and restarted on his home medications.  Patient has a history of polysubstance abuse and depression.  Today the patient reports that he is feeling better after he got some much-needed sleep because he has not been sleeping much for the last 4 days.  He also reports that he had relapsed on crack cocaine and his last use was yesterday.  Patient reported that his wife was unaware of his substance use and does not want to kill her because they have marital issues already.  Patient continued denying any  suicidal or homicidal ideations and denied any hallucinations.  Patient contacted his wife to come pick him up today.  Patient reported that he had medications at home that he can continue.  Patient stated that he had follow-up appointments already and did not need any additional outpatient resources.  Patient refused to go to substance abuse treatment at this time.  Total Time spent with patient: 20 minutes  Past Psychiatric History: Polysubstance abuse, depression Past Medical History:  Past Medical History:  Diagnosis Date  . CHF (congestive heart failure) (HCC)   . Exposure to hepatitis B   . Exposure to hepatitis C   . Hepatitis C   . 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, ending around 2017  . Hypertension   . IV drug abuse (HCC)   . Renal disorder    kidney stones    Past Surgical History:  Procedure Laterality Date  . APPENDECTOMY    . EYE SURGERY     secondary to dog bite   Family History:  Family History  Problem Relation Age of Onset  . Hypertension Father    Family Psychiatric History: None reported Social History:  Social History   Substance and Sexual Activity  Alcohol Use No     Social History   Substance and Sexual Activity  Drug Use Yes  . Types: IV, Cocaine   Comment: hx of heroin abuse (opioid addiction), crack    Social History  Socioeconomic History  . Marital status: Married    Spouse name: Not on file  . Number of children: Not on file  . Years of education: Not on file  . Highest education level: Not on file  Occupational History  . Not on file  Tobacco Use  . Smoking status: Former Games developer  . Smokeless tobacco: Never Used  Substance and Sexual Activity  . Alcohol use: No  . Drug use: Yes    Types: IV, Cocaine    Comment: hx of heroin abuse (opioid addiction), crack  . Sexual activity: Not on file  Other Topics Concern  . Not on file  Social History Narrative  . Not on file   Social Determinants of Health    Financial Resource Strain: Not on file  Food Insecurity: Not on file  Transportation Needs: Not on file  Physical Activity: Not on file  Stress: Not on file  Social Connections: Not on file   SDOH:  SDOH Screenings   Alcohol Screen: Not on file  Depression (PHQ2-9): Medium Risk  . PHQ-2 Score: 18  Financial Resource Strain: Not on file  Food Insecurity: Not on file  Housing: Not on file  Physical Activity: Not on file  Social Connections: Not on file  Stress: Not on file  Tobacco Use: Medium Risk  . Smoking Tobacco Use: Former Smoker  . Smokeless Tobacco Use: Never Used  Transportation Needs: Not on file    Has this patient used any form of tobacco in the last 30 days? (Cigarettes, Smokeless Tobacco, Cigars, and/or Pipes) A prescription for an FDA-approved tobacco cessation medication was offered at discharge and the patient refused  Current Medications:  Current Facility-Administered Medications  Medication Dose Route Frequency Provider Last Rate Last Admin  . acetaminophen (TYLENOL) tablet 650 mg  650 mg Oral Q6H PRN Patrcia Dolly, FNP      . alum & mag hydroxide-simeth (MAALOX/MYLANTA) 200-200-20 MG/5ML suspension 30 mL  30 mL Oral Q4H PRN Patrcia Dolly, FNP      . buPROPion (WELLBUTRIN XL) 24 hr tablet 300 mg  300 mg Oral Daily Patrcia Dolly, FNP   300 mg at 12/04/20 1001  . hydrOXYzine (ATARAX/VISTARIL) tablet 25 mg  25 mg Oral TID PRN Patrcia Dolly, FNP   25 mg at 12/03/20 2103  . lamoTRIgine (LAMICTAL) tablet 100 mg  100 mg Oral BID Patrcia Dolly, FNP   100 mg at 12/04/20 1002  . magnesium hydroxide (MILK OF MAGNESIA) suspension 30 mL  30 mL Oral Daily PRN Patrcia Dolly, FNP      . traZODone (DESYREL) tablet 50 mg  50 mg Oral QHS PRN Patrcia Dolly, FNP   50 mg at 12/03/20 2103   Current Outpatient Medications  Medication Sig Dispense Refill  . amphetamine-dextroamphetamine (ADDERALL) 20 MG tablet Take 20 mg by mouth 2 (two) times daily.    Marland Kitchen buPROPion (WELLBUTRIN XL) 300  MG 24 hr tablet Take 300 mg by mouth daily.    . SUBLOCADE 100 MG/0.5ML SOSY Inject 100 mg into the skin every 28 (twenty-eight) days.    Marland Kitchen lamoTRIgine (LAMICTAL) 100 MG tablet Take 1 tablet (100 mg total) by mouth 2 (two) times daily. 60 tablet 2    PTA Medications: (Not in a hospital admission)   Musculoskeletal  Strength & Muscle Tone: within normal limits Gait & Station: normal Patient leans: N/A  Psychiatric Specialty Exam  Presentation  General Appearance: Appropriate for Environment; Casual  Eye Contact:Good  Speech:Clear and Coherent; Normal Rate  Speech Volume:Normal  Handedness:Right   Mood and Affect  Mood:Anxious  Affect:Appropriate; Congruent   Thought Process  Thought Processes:Coherent  Descriptions of Associations:Intact  Orientation:Full (Time, Place and Person)  Thought Content:WDL  Hallucinations:Hallucinations: None  Ideas of Reference:None  Suicidal Thoughts:Suicidal Thoughts: No  Homicidal Thoughts:Homicidal Thoughts: No   Sensorium  Memory:Immediate Good; Recent Good; Remote Good  Judgment:Fair  Insight:Fair   Executive Functions  Concentration:Good  Attention Span:Good  Recall:Good  Fund of Knowledge:Good  Language:Good   Psychomotor Activity  Psychomotor Activity:Psychomotor Activity: Normal   Assets  Assets:Communication Skills; Desire for Improvement; Financial Resources/Insurance; Housing; Physical Health; Social Support   Sleep  Sleep:Sleep: Good Number of Hours of Sleep: 2   Nutritional Assessment (For OBS and FBC admissions only) Has the patient had a weight loss or gain of 10 pounds or more in the last 3 months?: No Has the patient had a decrease in food intake/or appetite?: No Does the patient have dental problems?: No Does the patient have eating habits or behaviors that may be indicators of an eating disorder including binging or inducing vomiting?: No Has the patient recently lost weight without  trying?: No Has the patient been eating poorly because of a decreased appetite?: No Malnutrition Screening Tool Score: 0    Physical Exam  Physical Exam Vitals and nursing note reviewed.  Constitutional:      Appearance: He is well-developed.  HENT:     Head: Normocephalic.  Eyes:     Pupils: Pupils are equal, round, and reactive to light.  Cardiovascular:     Rate and Rhythm: Normal rate.  Pulmonary:     Effort: Pulmonary effort is normal.  Musculoskeletal:        General: Normal range of motion.  Neurological:     Mental Status: [name] to person, place, and time.    Review of Systems  Constitutional: Negative.   HENT: Negative.   Eyes: Negative.   Respiratory: Negative.   Cardiovascular: Negative.   Gastrointestinal: Negative.   Genitourinary: Negative.   Musculoskeletal: Negative.   Skin: Negative.   Neurological: Negative.   Endo/Heme/Allergies: Negative.   Psychiatric/Behavioral: Positive for substance abuse.   Blood pressure 116/81, pulse 63, temperature (!) 97.4 F (36.3 C), temperature source Temporal, resp. rate 16, SpO2 100 %. There is no height or weight on file to calculate BMI.  Demographic Factors:  Male and Caucasian  Loss Factors: NA  Historical Factors: NA  Risk Reduction Factors:   Sense of responsibility to family, Employed, Living with another person, especially a relative, Positive social support and Positive therapeutic relationship  Continued Clinical Symptoms:  Alcohol/Substance Abuse/Dependencies Previous Psychiatric Diagnoses and Treatments  Cognitive Features That Contribute To Risk:  None    Suicide Risk:  Minimal: No identifiable suicidal ideation.  Patients presenting with no risk factors but with morbid ruminations; may be classified as minimal risk based on the severity of the depressive symptoms  Plan Of Care/Follow-up recommendations:  Continue activity as tolerated. Continue diet as recommended by  your PCP. Ensure to keep all appointments with outpatient providers.  Disposition: Discharge home to wife  Maryfrances Bunnell, FNP 12/04/2020, 11:51 AM

## 2020-12-04 NOTE — ED Notes (Signed)
Pt sleeping@this time. breathing even and unlabored. Will continue to monitor for safety 

## 2020-12-04 NOTE — ED Notes (Signed)
Formatting of this note might be different from the original.  Pt sleeping@this  time. breathing even and unlabored. Will continue to monitor for safety  Electronically signed by Maryln Manuel, LPN at 30/86/5784  4:43 AM EST

## 2020-12-04 NOTE — ED Provider Notes (Signed)
Formatting of this note is different from the original.  FBC/OBS ASAP Discharge Summary    Date and Time: 12/04/2020 11:51 AM   Name: Lucas Hicks   MRN:  [id number]     Discharge Diagnoses:   Final diagnoses:   Moderate episode of recurrent major depressive disorder (HCC)   Cocaine abuse with cocaine-induced mood disorder (HCC)     Subjective: Patient reports today that he is feeling better.  Patient denies having any suicidal homicidal ideations and denies any hallucinations.  Patient family admits that he had relapsed on crack cocaine and abuse for 3 to 4 days.  He states that he does remember having hallucinations and was appearing to be paranoid at that time and reports that it is due to the substance use.  He reports that his family does not do this and that is probably why they had him placed under IVC because they were unaware of the substance use causing these issues.  He reports that he would like for his wife not to know because they are already having enough marital problems.  Patient reports that he has has a long history of polysubstance abuse and usually stay sober for 90 days to 6 months before he relapses again.  Patient states that he would like to discharge home today and that he will contact his wife to come and pick him up.    Stay Summary: Patient is a 40 year old male who presented to the [facility] under IVC due to being delusional, hallucinating, and paranoid and off of his psychiatric medications.  Patient was admitted to the continuous observation unit and restarted on his home medications.  Patient has a history of polysubstance abuse and depression.  Today the patient reports that he is feeling better after he got some much-needed sleep because he has not been sleeping much for the last 4 days.  He also reports that he had relapsed on crack cocaine and his last use was yesterday.  Patient reported that his wife was unaware of his substance use and does not want to kill her because they have  marital issues already.  Patient continued denying any suicidal or homicidal ideations and denied any hallucinations.  Patient contacted his wife to come pick him up today.  Patient reported that he had medications at home that he can continue.  Patient stated that he had follow-up appointments already and did not need any additional outpatient resources.  Patient refused to go to substance abuse treatment at this time.    Total Time spent with patient: 20 minutes    Past Psychiatric History: Polysubstance abuse, depression  Past Medical History:   Past Medical History:   Diagnosis Date   ? CHF (congestive heart failure) (HCC)    ? Exposure to hepatitis B    ? Exposure to hepatitis C    ? Hepatitis C    ? History of opioid abuse (HCC)     reports heroin abuse, ending around 2017   ? Hypertension    ? IV drug abuse (HCC)    ? Renal disorder     kidney stones     Past Surgical History:   Procedure Laterality Date   ? APPENDECTOMY     ? EYE SURGERY      secondary to dog bite     Family History:   Family History   Problem Relation Age of Onset   ? Hypertension Father      Family Psychiatric History: None reported  Social History:   Social History     Substance and Sexual Activity   Alcohol Use No     Social History     Substance and Sexual Activity   Drug Use Yes   ? Types: IV, Cocaine    Comment: hx of heroin abuse (opioid addiction), crack     Social History     Socioeconomic History   ? Marital status: Married     Spouse name: Not on file   ? Number of children: Not on file   ? Years of education: Not on file   ? Highest education level: Not on file   Occupational History   ? Not on file   Tobacco Use   ? Smoking status: Former Smoker   ? Smokeless tobacco: Never Used   Substance and Sexual Activity   ? Alcohol use: No   ? Drug use: Yes     Types: IV, Cocaine     Comment: hx of heroin abuse (opioid addiction), crack   ? Sexual activity: Not on file   Other Topics Concern   ? Not on file   Social History Narrative   ?  Not on file     Social Determinants of Health     Financial Resource Strain: Not on file   Food Insecurity: Not on file   Transportation Needs: Not on file   Physical Activity: Not on file   Stress: Not on file   Social Connections: Not on file     SDOH:   SDOH Screenings     Alcohol Screen: Not on file   Depression (EAV4-0): Medium Risk   ? PHQ-2 Score: 18   Financial Resource Strain: Not on file   Food Insecurity: Not on file   Housing: Not on file   Physical Activity: Not on file   Social Connections: Not on file   Stress: Not on file   Tobacco Use: Medium Risk   ? Smoking Tobacco Use: Former Smoker   ? Smokeless Tobacco Use: Never Used   Transportation Needs: Not on file     Has this patient used any form of tobacco in the last 30 days? (Cigarettes, Smokeless Tobacco, Cigars, and/or Pipes) A prescription for an FDA-approved tobacco cessation medication was offered at discharge and the patient refused    Current Medications:   Current Facility-Administered Medications   Medication Dose Route Frequency Provider Last Rate Last Admin   ? acetaminophen (TYLENOL) tablet 650 mg  650 mg Oral Q6H PRN Patrcia Dolly, FNP       ? alum & mag hydroxide-simeth (MAALOX/MYLANTA) 200-200-20 MG/5ML suspension 30 mL  30 mL Oral Q4H PRN Patrcia Dolly, FNP       ? buPROPion (WELLBUTRIN XL) 24 hr tablet 300 mg  300 mg Oral Daily Patrcia Dolly, FNP   300 mg at 12/04/20 1001   ? hydrOXYzine (ATARAX/VISTARIL) tablet 25 mg  25 mg Oral TID PRN Patrcia Dolly, FNP   25 mg at 12/03/20 2103   ? lamoTRIgine (LAMICTAL) tablet 100 mg  100 mg Oral BID Patrcia Dolly, FNP   100 mg at 12/04/20 1002   ? magnesium hydroxide (MILK OF MAGNESIA) suspension 30 mL  30 mL Oral Daily PRN Patrcia Dolly, FNP       ? traZODone (DESYREL) tablet 50 mg  50 mg Oral QHS PRN Patrcia Dolly, FNP   50 mg at 12/03/20 2103     Current Outpatient Medications  Medication Sig Dispense Refill   ? amphetamine-dextroamphetamine (ADDERALL) 20 MG tablet Take 20 mg by mouth 2 (two) times  daily.     ? buPROPion (WELLBUTRIN XL) 300 MG 24 hr tablet Take 300 mg by mouth daily.     ? SUBLOCADE 100 MG/0.5ML SOSY Inject 100 mg into the skin every 28 (twenty-eight) days.     ? lamoTRIgine (LAMICTAL) 100 MG tablet Take 1 tablet (100 mg total) by mouth 2 (two) times daily. 60 tablet 2     PTA Medications: (Not in a hospital admission)    Musculoskeletal   Strength & Muscle Tone: within normal limits  Gait & Station: normal  Patient leans: N/A    Psychiatric Specialty Exam   Presentation   General Appearance: Appropriate for Environment; Casual    Eye Contact:Good    Speech:Clear and Coherent; Normal Rate    Speech Volume:Normal    Handedness:Right    Mood and Affect   Mood:Anxious    Affect:Appropriate; Congruent    Thought Process   Thought Processes:Coherent    Descriptions of Associations:Intact    Orientation:Full (Time, Place and Person)    Thought Content:WDL   Hallucinations:Hallucinations: None    Ideas of Reference:None    Suicidal Thoughts:Suicidal Thoughts: No    Homicidal Thoughts:Homicidal Thoughts: No    Sensorium   Memory:Immediate Good; Recent Good; Remote Good    Judgment:Fair    Insight:Fair    Executive Functions   Concentration:Good    Attention Span:Good    Recall:Good    Fund of Knowledge:Good    Language:Good    Psychomotor Activity   Psychomotor Activity:Psychomotor Activity: Normal    Assets   Assets:Communication Skills; Desire for Improvement; Financial Resources/Insurance; Housing; Physical Health; Social Support    Sleep   Sleep:Sleep: Good  Number of Hours of Sleep: 2    Nutritional Assessment (For OBS and FBC admissions only)  Has the patient had a weight loss or gain of 10 pounds or more in the last 3 months?: No  Has the patient had a decrease in food intake/or appetite?: No  Does the patient have dental problems?: No  Does the patient have eating habits or behaviors that may be indicators of an eating disorder including binging or inducing vomiting?: No  Has the patient  recently lost weight without trying?: No  Has the patient been eating poorly because of a decreased appetite?: No  Malnutrition Screening Tool Score: 0    Physical Exam   Physical Exam  Vitals and nursing note reviewed.   Constitutional:       Appearance: He is well-developed.   HENT:      Head: Normocephalic.   Eyes:      Pupils: Pupils are equal, round, and reactive to light.   Cardiovascular:      Rate and Rhythm: Normal rate.   Pulmonary:      Effort: Pulmonary effort is normal.   Musculoskeletal:         General: Normal range of motion.   Neurological:      Mental Status: [name] to person, place, and time.     Review of Systems   Constitutional: Negative.    HENT: Negative.    Eyes: Negative.    Respiratory: Negative.    Cardiovascular: Negative.    Gastrointestinal: Negative.    Genitourinary: Negative.    Musculoskeletal: Negative.    Skin: Negative.    Neurological: Negative.    Endo/Heme/Allergies: Negative.  Psychiatric/Behavioral: Positive for substance abuse.     Blood pressure 116/81, pulse 63, temperature (!) 97.4 F (36.3 C), temperature source Temporal, resp. rate 16, SpO2 100 %. There is no height or weight on file to calculate BMI.    Demographic Factors:   Male and Caucasian    Loss Factors:  NA    Historical Factors:  NA    Risk Reduction Factors:    Sense of responsibility to family, Employed, Living with another person, especially a relative, Positive social support and Positive therapeutic relationship    Continued Clinical Symptoms:   Alcohol/Substance Abuse/Dependencies  Previous Psychiatric Diagnoses and Treatments    Cognitive Features That Contribute To Risk:   None      Suicide Risk:   Minimal: No identifiable suicidal ideation.  Patients presenting with no risk factors but with morbid ruminations; may be classified as minimal risk based on the severity of the depressive symptoms    Plan Of Care/Follow-up recommendations:   Continue activity as tolerated. Continue  diet as recommended by your PCP. Ensure to keep all appointments with outpatient providers.    Disposition: Discharge home to wife    Maryfrances Bunnell, FNP  12/04/2020, 11:51 AM    Electronically signed by Nelly Rout, MD at 12/05/2020  7:53 AM EST    Associated attestation - Nelly Rout, MD - 12/05/2020  7:53 AM EST  Formatting of this note might be different from the original.  Patient has stabilized with continuous assessment for stabilization and treatment

## 2020-12-04 NOTE — ED Notes (Signed)
Formatting of this note might be different from the original.  Educated pt on avs and medications. Verbalized understanding. Escorted to retrieve belongings. Ambulated per self. No s/s pain, discomfort, or acute distress. No SI, HI, or AVH noted. A&O x4. Escorted out back [place] to safe transport for transportation home. Stable at time of d/c  Electronically signed by Felicity Pellegrini, LPN at 66/44/0347  1:04 PM EST

## 2020-12-04 NOTE — ED Notes (Signed)
Formatting of this note might be different from the original.  Resting with eyes closed. Rise and fall of chest noted. No new issues noted at this time. Will continue to monitor for safety  Electronically signed by Felicity Pellegrini, LPN at 16/07/9603  7:43 AM EST

## 2020-12-05 LAB — PROLACTIN: Prolactin: 16.2 ng/mL — ABNORMAL HIGH (ref 4.0–15.2)

## 2020-12-06 ENCOUNTER — Ambulatory Visit: Payer: 59 | Admitting: Internal Medicine

## 2020-12-13 ENCOUNTER — Emergency Department: Payer: 59

## 2020-12-13 ENCOUNTER — Emergency Department
Admission: EM | Admit: 2020-12-13 | Discharge: 2020-12-13 | Disposition: A | Discharge status: Home / Self Care | Payer: 59 | Attending: Emergency Medicine | Admitting: Emergency Medicine

## 2020-12-13 ENCOUNTER — Other Ambulatory Visit: Payer: Self-pay

## 2020-12-13 ENCOUNTER — Encounter: Payer: Self-pay | Admitting: Emergency Medicine

## 2020-12-13 DIAGNOSIS — R1032 Left lower quadrant pain: Secondary | ICD-10-CM

## 2020-12-13 DIAGNOSIS — I110 Hypertensive heart disease with heart failure: Secondary | ICD-10-CM | POA: Insufficient documentation

## 2020-12-13 DIAGNOSIS — Z87891 Personal history of nicotine dependence: Secondary | ICD-10-CM | POA: Insufficient documentation

## 2020-12-13 DIAGNOSIS — N200 Calculus of kidney: Secondary | ICD-10-CM

## 2020-12-13 DIAGNOSIS — N503 Cyst of epididymis: Secondary | ICD-10-CM

## 2020-12-13 DIAGNOSIS — I509 Heart failure, unspecified: Secondary | ICD-10-CM | POA: Insufficient documentation

## 2020-12-13 DIAGNOSIS — I861 Scrotal varices: Secondary | ICD-10-CM

## 2020-12-13 DIAGNOSIS — R1030 Lower abdominal pain, unspecified: Secondary | ICD-10-CM | POA: Diagnosis present

## 2020-12-13 LAB — CHLAMYDIA/NGC RT PCR (ARMC ONLY)
Chlamydia Tr: NOT DETECTED
N gonorrhoeae: NOT DETECTED

## 2020-12-13 NOTE — ED Notes (Signed)
Patient updated on POC.

## 2020-12-13 NOTE — Discharge Instructions (Signed)
You have a normal exam at this time without indication of inguinal hernia on clinical exam or on ultrasound.  You do have a small varicocele on the left which is likely the source of your discomfort.  You did pass a small kidney stone in the ED. You also have a small epididymal cyst on the right.  Both of these conditions are benign and not considered serious, life-threatening, or caused by infection. They should resolve without intervention. See your provider or Urology as needed.

## 2020-12-13 NOTE — ED Provider Notes (Signed)
[facility] Emergency Department Provider Note ____________________________________________  Time seen: 1950  I have reviewed the triage vital signs and the nursing notes.  HISTORY  Chief Complaint  Groin Pain   HPI Randall Parsons is a 40 y.o. male presents [facility], for evaluation of left > right-sided groin pain.   He describes onset a few days ago, but denies any preceding trauma, strain, dysuria, hematuria, or urinary retention.  Patient denies a history of hernias, but does give a history of kidney stones.  He denies any concern for STD as his only partner is his wife.  He also denies any penile lesions or penile discharge.  Patient was thought to have a possible incarcerated hernia on the left side secondary to his exquisite tenderness.  He denies any fever, chills, nausea, vomiting, abdominal pain, flank pain, chest pain or dizziness.  Past Medical History:  Diagnosis Date  . CHF (congestive heart failure) (HCC)   . Exposure to hepatitis B   . Exposure to hepatitis C   . Hepatitis C   . 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, ending around 2017  . Hypertension   . IV drug abuse (HCC)   . Renal disorder    kidney stones    Patient Active Problem List   Diagnosis Date Noted  . GAD (generalized anxiety disorder) 04/04/2020  . Attention deficit hyperactivity disorder, combined type 04/04/2020  . Opioid type dependence, continuous (HCC) 04/04/2020  . Cocaine use disorder, severe, dependence (HCC) 04/04/2020  . Major depressive disorder 07/14/2019  . Alcohol abuse 07/14/2019  . 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 Cocaine abuse (HCC) 07/12/2019  . Essential hypertension 11/28/2017  . NICM (nonischemic cardiomyopathy) (HCC) 11/28/2017    Past Surgical History:  Procedure Laterality Date  . APPENDECTOMY    . EYE SURGERY     secondary to dog bite  . TIBIA FRACTURE SURGERY Right      Prior to Admission medications   Medication Sig Start Date End Date Taking? Authorizing Provider  amphetamine-dextroamphetamine (ADDERALL) 20 MG tablet Take 20 mg by mouth 2 (two) times daily. 11/20/20   [provider]  buPROPion (WELLBUTRIN XL) 300 MG 24 hr tablet Take 300 mg by mouth daily.    [provider]  lamoTRIgine (LAMICTAL) 100 MG tablet Take 1 tablet (100 mg total) by mouth 2 (two) times daily. 12/04/20 03/04/21  Money, Gerlene Burdock, FNP  SUBLOCADE 100 MG/0.5ML SOSY Inject 100 mg into the skin every 28 (twenty-eight) days. 11/20/20   [provider]  pantoprazole (PROTONIX) 40 MG tablet Take 1 tablet (40 mg total) by mouth daily. 07/17/19 08/03/19  Clapacs, Jackquline Denmark, MD    Allergies Amoxicillin  Family History  Problem Relation Age of Onset  . Hypertension Father     Social History Social History   Tobacco Use  . Smoking status: Former Games developer  . Smokeless tobacco: Never Used  Vaping Use  . Vaping Use: Every day  Substance Use Topics  . Alcohol use: No  . Drug use: Not Currently    Types: IV, Cocaine    Comment: hx of heroin abuse (opioid addiction), crack    Review of Systems  Constitutional: Negative for fever. Cardiovascular: Negative for chest pain. Respiratory: Negative for shortness of breath. Gastrointestinal: Negative for abdominal pain, vomiting and diarrhea. Genitourinary: Negative for dysuria.  Left greater than right groin pain as above. Musculoskeletal: Negative for back pain.  Skin: Negative for rash. Neurological: Negative for headaches, focal weakness or numbness. ____________________________________________  PHYSICAL EXAM:  VITAL SIGNS: ED Triage Vitals [12/13/20 1910]  Enc Vitals Group     BP 140/87     Pulse Rate 100     Resp 16     Temp 98 F (36.7 C)     Temp Source Oral     SpO2 100 %     Weight 190 lb (86.2 kg)     Height 5\' 9"  (1.753 m)     Head Circumference      Peak Flow      Pain Score 5      Pain Loc      Pain Edu?      Excl. in GC?     Constitutional: Alert and oriented. Well appearing and in no distress. Head: Normocephalic and atraumatic. Eyes: Conjunctivae are normal. Normal extraocular movements Cardiovascular: Normal rate, regular rhythm. Normal distal pulses. Respiratory: Normal respiratory effort. No wheezes/rales/rhonchi. Gastrointestinal: Soft and nontender. No distention. GU: Normal external genitalia.  Circumcised glans.  No palpable inguinal hernia noted bilaterally.  Patient is exquisitely tender to mild palpation however, along the inguinal lymph node chain and inguinal canal on the left side of the scrotum.  Patient did apparently pass a 5 mm stone, that was visualized in his urine specimen cup. DRE deferred. Musculoskeletal: Nontender with normal range of motion in all extremities.  Neurologic:  Normal gait without ataxia. Normal speech and language. No gross focal neurologic deficits are appreciated. Skin:  Skin is warm, dry and intact. No rash noted. Psychiatric: Mood and affect are normal. Patient exhibits appropriate insight and judgment. ____________________________________________   LABS (pertinent positives/negatives) Labs Reviewed  CHLAMYDIA/NGC RT PCR ([facility] ONLY)  ____________________________________________   RADIOLOGY  Scrotum  IMPRESSION: 1. Small right epididymal head cyst. 2. Left-sided varicocele. ____________________________________________  PROCEDURES  Procedures ____________________________________________  INITIAL IMPRESSION / ASSESSMENT AND PLAN / ED COURSE  Differential diagnosis includes, but is not limited to, acute appendicitis, renal colic, testicular torsion, urinary tract infection/pyelonephritis, prostatitis,  epididymitis, diverticulitis, small bowel obstruction or ileus, colitis, abdominal aortic aneurysm, gastroenteritis, hernia, etc.  Patient ED evaluation from local urgent care for exquisite left  inguinal pain, concerning for possible incarcerated hernia.  Patient was found clinically without  Findings of an inguinal hernia bilaterally.  Ultrasound was also reassuring as it did not find any hernias.  Patient did, however, have a left-sided varicocele, and a right sided 4 mm epididymal cyst.  Patient's urine culture for gonorrhea chlamydia is negative at this time.  Patient is discharged and reassured of the benign, self-limited diagnosis of both varicocele and epididymal cyst.  He will take over-the-counter Tylenol or Motrin.  And follow-up with his primary provider or urology for ongoing symptoms.  Work note is provided as requested.   Randall Parsons was evaluated in Emergency Department on 12/13/2020 for the symptoms described in the history of present illness. He was evaluated in the context of the global COVID-19 pandemic, which necessitated consideration that the patient might be at risk for infection with the SARS-CoV-2 virus that causes COVID-19. Institutional protocols and algorithms that pertain to the evaluation of patients at risk for COVID-19 are in a state of rapid change based on information released by regulatory bodies including the CDC and federal and state organizations. These policies and algorithms were followed during the patient's care in the ED. ____________________________________________  FINAL CLINICAL IMPRESSION(S) / ED DIAGNOSES  Final diagnoses:  Left inguinal pain  Left varicocele  Epididymal cyst  Kidney stone      Kelsei Defino, Charlesetta Ivory, PA-C 12/13/20 2232    Merwyn Katos, MD 12/13/20 [id number]

## 2020-12-13 NOTE — ED Notes (Signed)
Patient is resting comfortably. 

## 2020-12-13 NOTE — ED Notes (Signed)
Two work notes provided to patient.

## 2020-12-13 NOTE — ED Notes (Signed)
Upon collection of urine to send to lab, a circular,round, hard, brown substance measuring <1cm was seen in urine. Substance shown to PA. No new orders received. Urine sent to lab. Patient reports hx of ureter/kidney stones.

## 2020-12-13 NOTE — ED Triage Notes (Signed)
Pt to ED from home c/o left and right groin pain for a couple days, states burning sensation with urination, denies hematuria.  Was sent [facility] today to rul out incarcerated hernia.  Pt denies noticing hernia but states very tender to touch in bilateral inguinal area.

## 2020-12-13 NOTE — ED Notes (Signed)
Patient reports groin pain bilaterally x 2 days. Patient reports increased pain today. Patient reports he was seen at an urgent care, and diagnosed with inguinal hernia and sent to the ED for follow up. Patient reports pain to groin worse on the left side at this time.

## 2020-12-13 NOTE — ED Notes (Signed)
Ultrasound at bedside

## 2020-12-13 NOTE — ED Notes (Signed)
Patient provided discharge instructions, including follow up information. Patient verbalized understanding.

## 2020-12-18 ENCOUNTER — Encounter: Payer: Self-pay | Admitting: Family Medicine

## 2020-12-18 ENCOUNTER — Ambulatory Visit (INDEPENDENT_AMBULATORY_CARE_PROVIDER_SITE_OTHER): Payer: 59 | Admitting: Family Medicine

## 2020-12-18 ENCOUNTER — Telehealth: Payer: Self-pay

## 2020-12-18 ENCOUNTER — Other Ambulatory Visit: Payer: Self-pay

## 2020-12-18 VITALS — BP 120/70 | HR 73 | Temp 97.7°F | Ht 69.0 in | Wt 189.0 lb

## 2020-12-18 DIAGNOSIS — Z2821 Immunization not carried out because of patient refusal: Secondary | ICD-10-CM

## 2020-12-18 DIAGNOSIS — B1920 Unspecified viral hepatitis C without hepatic coma: Secondary | ICD-10-CM

## 2020-12-18 DIAGNOSIS — F1120 Opioid dependence, uncomplicated: Secondary | ICD-10-CM

## 2020-12-18 DIAGNOSIS — F331 Major depressive disorder, recurrent, moderate: Secondary | ICD-10-CM | POA: Diagnosis not present

## 2020-12-18 DIAGNOSIS — F902 Attention-deficit hyperactivity disorder, combined type: Secondary | ICD-10-CM

## 2020-12-18 DIAGNOSIS — F1010 Alcohol abuse, uncomplicated: Secondary | ICD-10-CM

## 2020-12-18 DIAGNOSIS — F411 Generalized anxiety disorder: Secondary | ICD-10-CM

## 2020-12-18 DIAGNOSIS — N200 Calculus of kidney: Secondary | ICD-10-CM | POA: Diagnosis not present

## 2020-12-18 DIAGNOSIS — Z7189 Other specified counseling: Secondary | ICD-10-CM

## 2020-12-18 DIAGNOSIS — R7989 Other specified abnormal findings of blood chemistry: Secondary | ICD-10-CM

## 2020-12-18 MED ORDER — GABAPENTIN 300 MG PO CAPS: 600.0000 mg | ORAL_CAPSULE | Freq: Three times a day (TID) | ORAL | Status: DC | PRN

## 2020-12-18 MED ORDER — LAMOTRIGINE 100 MG PO TABS: 200.0000 mg | ORAL_TABLET | Freq: Every day | ORAL | Status: DC

## 2020-12-18 MED ORDER — LAMOTRIGINE 200 MG PO TABS: 200.0000 mg | ORAL_TABLET | Freq: Every day | ORAL | Status: DC

## 2020-12-18 MED ORDER — DOXEPIN HCL 10 MG PO CAPS: 10.0000 mg | ORAL_CAPSULE | Freq: Every evening | ORAL | Status: DC | PRN

## 2020-12-18 NOTE — Patient Instructions (Addendum)
Let me check on options with the kidney stone and we'll be in touch.  Take care.  Glad to see you. Try sleeping in an OTC wrist splint.

## 2020-12-18 NOTE — Progress Notes (Unsigned)
This visit occurred during the SARS-CoV-2 public health emergency.  Safety protocols were in place, including screening questions prior to the visit, additional usage of staff PPE, and extensive cleaning of exam room while observing appropriate contact time as indicated for disinfecting solutions.  New patient.    ADD and anxiety/panic.  tx'd by Mosetta Anis at outside clinic.  He is in therapy.  He has support.  He isn't drinking etoh.  On sublocade per Laurian Brim, per outside clinic.  On gabapentin for nerve pain in R leg.    Per patient, issues with addiction started around age 40, initially MJ, then oxycodone, then heroin/cocaine.  Sober as of 2021 after treatment.  Encouraged sobriety.  "It's a struggle."    Working at [employer] in [place].  Married, has 5 step kids (2 at home).  From [facility].  Estate agent.   H/o HBV and HCV exposure, no known active disease per patient report.   Tetanus done about 1 month ago per patient report.  Covid vaccine declined.  History of mild increase in prolactin level.  Patient was unaware.  Discussed.  See follow-up phone note.  Previous emergency room evaluation for groin/testicular symptoms. Prev u/s with: IMPRESSION: 1. Small right epididymal head cyst. 2. Left-sided varicocele. No pain now.    Recent kidney stone.  He was able to pass the stone recently.  He brought the stone in today.  Stone analysis pending.  H/o mult stones in the past.   Wife designated if patient were incapacitated.    L fingertips tingling.  No R handed sx.  Going on for about 5-6 months.  Waking up with sx. R handed.    Echo from 2019 discussed with patient.  - Left ventricle: The cavity size was normal. There was mild  concentric hypertrophy. Systolic function was normal. The  estimated ejection fraction was in the range of 60% to 65%. Wall  motion was normal; there were no regional wall motion  abnormalities.  - Right ventricle: The cavity size was  mildly dilated.  - Pericardium, extracardiac: A trivial pericardial effusion was  identified.   Meds, vitals, and allergies reviewed.   ROS: Per HPI unless specifically indicated in ROS section   GEN: nad, alert and oriented HEENT: ncat NECK: supple w/o LA CV: rrr PULM: ctab, no inc wob ABD: soft, +bs EXT: no edema SKIN: no acute rash Mild paresthesia noted in the left fingertips but normal grip.  Tinel negative.

## 2020-12-18 NOTE — Telephone Encounter (Signed)
[facility] Night - Client Nonclinical Telephone Record  [facility] Client [facility] Night - Client Client Site [facility] - Night Physician AA - PHYSICIAN, Crissie Figures- MD Contact Type Call Who Is Calling Patient / Member / Family / Caregiver Caller Name Sylvia Kondracki Caller Phone Number [phone] Patient Name Randall Parsons Patient DOB Jan 20, 1981 Call Type Message Only Information Provided Reason for Call Request for General Office Information Initial Comment Caller is confirming his appt for Monday. Disp. Time Disposition Final User 12/15/2020 5:15:48 PM General Information Provided Yes Rowe Clack Call Closed By: Rowe Clack Transaction Date/Time: 12/15/2020 5:12:50 PM (ET)

## 2020-12-20 ENCOUNTER — Encounter: Payer: Self-pay | Admitting: Family Medicine

## 2020-12-20 ENCOUNTER — Telehealth: Payer: Self-pay | Admitting: Family Medicine

## 2020-12-20 DIAGNOSIS — Z7189 Other specified counseling: Secondary | ICD-10-CM | POA: Insufficient documentation

## 2020-12-20 DIAGNOSIS — R7989 Other specified abnormal findings of blood chemistry: Secondary | ICD-10-CM | POA: Insufficient documentation

## 2020-12-20 DIAGNOSIS — B1920 Unspecified viral hepatitis C without hepatic coma: Secondary | ICD-10-CM | POA: Insufficient documentation

## 2020-12-20 DIAGNOSIS — Z2821 Immunization not carried out because of patient refusal: Secondary | ICD-10-CM | POA: Insufficient documentation

## 2020-12-20 NOTE — Assessment & Plan Note (Signed)
Per outside clinic.  I will defer. 

## 2020-12-20 NOTE — Assessment & Plan Note (Signed)
H/o HBV and HCV exposure, no known active disease per patient report.

## 2020-12-20 NOTE — Assessment & Plan Note (Signed)
Unclear source.  Patient was unaware.  See follow-up phone note.

## 2020-12-20 NOTE — Assessment & Plan Note (Signed)
Wife designated if patient were incapacitated.  

## 2020-12-20 NOTE — Assessment & Plan Note (Signed)
Advised to get vaccinated.  He declined.

## 2020-12-20 NOTE — Telephone Encounter (Signed)
Notify patient.  Still awaiting results on renal stone analysis.  I looked back in his medications.  I do not see an obvious cause from his listed medications that would contribute to an elevated prolactin level.  Needs repeat level done a nonfasting lab visit.  I put in the order.  Thanks.

## 2020-12-20 NOTE — Assessment & Plan Note (Addendum)
Submit for renal stone study.  See follow-up lab results.  Recent imaging discussed with patient.  35 minutes were devoted to patient care in this encounter (this includes time spent reviewing the patient's file/history, interviewing and examining the patient, counseling/reviewing plan with patient).

## 2020-12-20 NOTE — Assessment & Plan Note (Addendum)
Denies current use.  Reportedly sober.  Encouraged sobriety from substance.

## 2020-12-21 NOTE — Telephone Encounter (Signed)
LMTCB

## 2020-12-22 ENCOUNTER — Telehealth: Payer: Self-pay

## 2020-12-22 LAB — CALCULI, WITH PHOTOGRAPH (CLINICAL LAB)
Calcium Oxalate Monohydrate: 100 %
Weight Calculi: 18 mg

## 2020-12-22 NOTE — Telephone Encounter (Signed)
[facility] Night - Client Nonclinical Telephone Record  [facility] Client [facility] Night - Client Client Site [facility] - Night Physician Raechel Ache - MD Contact Type Call Who Is Calling Patient / Member / Family / Caregiver Caller Name Randall Parsons Caller Phone Number [phone] Patient Name Randall Parsons Patient DOB [birth year] Call Type Message Only Information Provided Reason for Call Request for Lab/Test Results Initial Comment Caller states he received a call from the office that may be regarding the results of his kidney stones. Additional Comment Office hours provided. Disp. Time Disposition Final User 12/21/2020 6:54:33 PM General Information Provided Yes Lottie Rater Call Closed By: Lottie Rater Transaction Date/Time: 12/21/2020 6:49:22 PM (ET)

## 2020-12-22 NOTE — Telephone Encounter (Signed)
Patient called back yesterday after hours per other TE. LMTCB

## 2020-12-22 NOTE — Telephone Encounter (Signed)
I called patient yesterday; see other TE note. LMTCB

## 2020-12-26 NOTE — Telephone Encounter (Signed)
Looks like labs came back from stone analysis; placed report in your box.

## 2020-12-27 ENCOUNTER — Telehealth: Payer: Self-pay | Admitting: *Deleted

## 2020-12-27 DIAGNOSIS — N200 Calculus of kidney: Secondary | ICD-10-CM

## 2020-12-27 MED ORDER — HYDROCHLOROTHIAZIDE 12.5 MG PO CAPS: 12.5000 mg | ORAL_CAPSULE | Freq: Every day | ORAL | 1 refills | Status: DC

## 2020-12-27 NOTE — Telephone Encounter (Signed)
Notify patient.  Calcium oxalate stone.  Would start taking hydrochlorothiazide 12.5 mg daily.  That can decrease urine calcium.  If he gets lightheaded with medication use then stop it and let me know.  Make sure he is drinking enough fluid to keep his urine clear or light-colored.  Can recheck bmet when he has his prolactin level checked.  I put in the order.  He needs a lab visit.  I would keep using Debrox daily before he irrigates his ears in the shower.  It can take multiple attempts to get affected.  If he does that for a few more days and still cannot get any relief then we can check him here at the clinic.  He would need an office visit for that.  Thanks.

## 2020-12-27 NOTE — Telephone Encounter (Signed)
Patient called back and was advised as instructed. Patient stated that he is okay with starting the Hydrochlorothiazide and would like it sent to [facility]. Please advised when patient needs to have the lab work done and confirm that bmet order is in.

## 2020-12-27 NOTE — Telephone Encounter (Signed)
Patient called wanting to know if the results have come back on the kidney stone that he had. Patient stated that he has ear wax build up in his right ear and has had this problem for years. Patient stated that he has been using OTC Debrox and flushing his ear out with warm water. Patient stated that he still has some wax build up in his ear and wants to know if you recommend something different.  Patient stated that the last time he got his ear flushed [place]. Patient wants to know what Dr. Para March would recommend since this has been an ongoing  problem for him for years.Marland Kitchen

## 2020-12-27 NOTE — Telephone Encounter (Signed)
See following phone note.  Thanks.  

## 2020-12-27 NOTE — Telephone Encounter (Signed)
rx sent.  Bmet ordered.  Would get prolactin and BMET done at lab visit about 1 week after starting medication.  Thanks.

## 2020-12-27 NOTE — Telephone Encounter (Signed)
Spoke to patient by telephone and was advised that he is at another appointment and requested a call back later.

## 2020-12-28 NOTE — Telephone Encounter (Signed)
Left message for patient that labs need to be done about a week after he starts medication. Advised to call back to schedule.

## 2020-12-28 NOTE — Telephone Encounter (Signed)
Patient made lab appt for 01/04/21

## 2021-01-04 ENCOUNTER — Other Ambulatory Visit: Payer: Self-pay

## 2021-01-04 ENCOUNTER — Other Ambulatory Visit (INDEPENDENT_AMBULATORY_CARE_PROVIDER_SITE_OTHER): Payer: 59

## 2021-01-04 DIAGNOSIS — R7989 Other specified abnormal findings of blood chemistry: Secondary | ICD-10-CM

## 2021-01-04 DIAGNOSIS — N200 Calculus of kidney: Secondary | ICD-10-CM

## 2021-01-04 LAB — PROLACTIN: Prolactin: 8.9 ng/mL (ref 2.0–18.0)

## 2021-01-05 LAB — BASIC METABOLIC PANEL
BUN: 16 mg/dL (ref 6–23)
CO2: 27 mEq/L (ref 19–32)
Calcium: 9.4 mg/dL (ref 8.4–10.5)
Chloride: 103 mEq/L (ref 96–112)
Creatinine, Ser: 1.11 mg/dL (ref 0.40–1.50)
GFR: 82.22 mL/min (ref >60.00)
Glucose, Bld: 90 mg/dL (ref 70–99)
Potassium: 4.1 mEq/L (ref 3.5–5.1)
Sodium: 138 mEq/L (ref 135–145)

## 2021-01-10 ENCOUNTER — Emergency Department: Payer: 59

## 2021-01-10 ENCOUNTER — Emergency Department
Admission: EM | Admit: 2021-01-10 | Discharge: 2021-01-10 | Disposition: A | Discharge status: Home / Self Care | Payer: 59 | Attending: Emergency Medicine | Admitting: Emergency Medicine

## 2021-01-10 ENCOUNTER — Other Ambulatory Visit: Payer: Self-pay

## 2021-01-10 ENCOUNTER — Encounter: Payer: Self-pay | Admitting: Emergency Medicine

## 2021-01-10 DIAGNOSIS — Z79899 Other long term (current) drug therapy: Secondary | ICD-10-CM | POA: Insufficient documentation

## 2021-01-10 DIAGNOSIS — I110 Hypertensive heart disease with heart failure: Secondary | ICD-10-CM | POA: Insufficient documentation

## 2021-01-10 DIAGNOSIS — I509 Heart failure, unspecified: Secondary | ICD-10-CM | POA: Diagnosis not present

## 2021-01-10 DIAGNOSIS — N200 Calculus of kidney: Secondary | ICD-10-CM | POA: Insufficient documentation

## 2021-01-10 DIAGNOSIS — R109 Unspecified abdominal pain: Secondary | ICD-10-CM | POA: Diagnosis present

## 2021-01-10 LAB — BASIC METABOLIC PANEL
Anion gap: 10 (ref 5–15)
BUN: 15 mg/dL (ref 6–20)
CO2: 23 mmol/L (ref 22–32)
Calcium: 9.3 mg/dL (ref 8.9–10.3)
Chloride: 102 mmol/L (ref 98–111)
Creatinine, Ser: 1.01 mg/dL (ref 0.61–1.24)
GFR, Estimated: >60 mL/min (ref >60)
Glucose, Bld: 101 mg/dL — ABNORMAL HIGH (ref 70–99)
Potassium: 3.9 mmol/L (ref 3.5–5.1)
Sodium: 135 mmol/L (ref 135–145)

## 2021-01-10 LAB — CBC
HCT: 41.3 % (ref 39.0–52.0)
Hemoglobin: 13.8 g/dL (ref 13.0–17.0)
MCH: 29.9 pg (ref 26.0–34.0)
MCHC: 33.4 g/dL (ref 30.0–36.0)
MCV: 89.4 fL (ref 80.0–100.0)
Platelets: 277 10*3/uL (ref 150–400)
RBC: 4.62 MIL/uL (ref 4.22–5.81)
RDW: 13.2 % (ref 11.5–15.5)
WBC: 4.5 10*3/uL (ref 4.0–10.5)
nRBC: 0 % (ref 0.0–0.2)

## 2021-01-10 LAB — URINALYSIS, COMPLETE (UACMP) WITH MICROSCOPIC
Bacteria, UA: NONE SEEN
Bilirubin Urine: NEGATIVE
Glucose, UA: NEGATIVE mg/dL
Hgb urine dipstick: NEGATIVE
Ketones, ur: NEGATIVE mg/dL
Leukocytes,Ua: NEGATIVE
Nitrite: NEGATIVE
Protein, ur: NEGATIVE mg/dL
Specific Gravity, Urine: 1.006 (ref 1.005–1.030)
Squamous Epithelial / HPF: NONE SEEN (ref 0–5)
pH: 5 (ref 5.0–8.0)

## 2021-01-10 MED ORDER — ONDANSETRON 4 MG PO TBDP
4.0000 mg | ORAL_TABLET | Freq: Once | ORAL | Status: AC | PRN
  Administered 2021-01-10: 4 mg via ORAL
  Filled 2021-01-10: qty 1

## 2021-01-10 MED ORDER — KETOROLAC TROMETHAMINE 30 MG/ML IJ SOLN
30.0000 mg | Freq: Once | INTRAMUSCULAR | Status: AC
  Administered 2021-01-10: 30 mg via INTRAVENOUS
  Filled 2021-01-10: qty 1

## 2021-01-10 MED ORDER — KETOROLAC TROMETHAMINE 10 MG PO TABS: 10.0000 mg | ORAL_TABLET | Freq: Four times a day (QID) | ORAL | 0 refills | Status: DC | PRN

## 2021-01-10 NOTE — ED Provider Notes (Signed)
[facility] Emergency Department Provider Note   ____________________________________________   Event Date/Time   First MD Initiated Contact with Patient 01/10/21 1310     (approximate)  I have reviewed the triage vital signs and the nursing notes.   HISTORY  Chief Complaint Flank Pain    HPI Randall Parsons is a 40 y.o. male patient presents with right flank pain that started this morning.  Patient that he was seen 4 weeks ago and had a kidney stone on the left.  Patient state he has been taking a diuretic which is worsening his symptoms.  Patient stated mild nausea.  Patient rates pain as 8/10.  Patient scribed pain is "achy".  No palliative measure prior to arrival.         Past Medical History:  Diagnosis Date  . CHF (congestive heart failure) (HCC)   . Exposure to hepatitis B   . Exposure to hepatitis C   . Hepatitis C   . 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, ending around 2017  . Hypertension   . IV drug abuse (HCC)   . Renal disorder    kidney stones    Patient Active Problem List   Diagnosis Date Noted  . COVID-19 vaccine dose declined 12/20/2020  . Hepatitis C test positive 12/20/2020  . Prolactin increased 12/20/2020  . Advance care planning 12/20/2020  . GAD (generalized anxiety disorder) 04/04/2020  . Attention deficit hyperactivity disorder, combined type 04/04/2020  . Opioid type dependence, continuous (HCC) 04/04/2020  . Major depressive disorder 07/14/2019  . Alcohol abuse 07/14/2019  . Cocaine abuse (HCC) 07/12/2019  . NICM (nonischemic cardiomyopathy) (HCC) 11/28/2017  . Renal stone 11/28/2017    Past Surgical History:  Procedure Laterality Date  . APPENDECTOMY    . EYE SURGERY     secondary to dog bite  . TIBIA FRACTURE SURGERY Right     Prior to Admission medications   Medication Sig Start Date End Date Taking? Authorizing Provider  ketorolac (TORADOL) 10 MG tablet Take 1 tablet (10 mg  total) by mouth every 6 (six) hours as needed. 01/10/21  Yes Joni Reining, PA-C  amphetamine-dextroamphetamine (ADDERALL) 20 MG tablet Take 20 mg by mouth 2 (two) times daily. 11/20/20   [provider]  buPROPion (WELLBUTRIN XL) 300 MG 24 hr tablet Take 300 mg by mouth daily.    [provider]  diazepam (VALIUM) 5 MG tablet Take 5 mg by mouth 4 (four) times daily as needed. 12/14/20   [provider]  doxepin (SINEQUAN) 10 MG capsule Take 1 capsule (10 mg total) by mouth at bedtime as needed. 12/18/20   Joaquim Nam, MD  gabapentin (NEURONTIN) 300 MG capsule Take 2 capsules (600 mg total) by mouth 3 (three) times daily as needed. 12/18/20   Joaquim Nam, MD  hydrochlorothiazide (MICROZIDE) 12.5 MG capsule Take 1 capsule (12.5 mg total) by mouth daily. 12/27/20   Joaquim Nam, MD  lamoTRIgine (LAMICTAL) 200 MG tablet Take 1 tablet (200 mg total) by mouth daily. 12/18/20 03/18/21  Joaquim Nam, MD  SUBLOCADE 100 MG/0.5ML SOSY Inject 100 mg into the skin every 28 (twenty-eight) days. 11/20/20   [provider]  pantoprazole (PROTONIX) 40 MG tablet Take 1 tablet (40 mg total) by mouth daily. 07/17/19 08/03/19  Clapacs, Jackquline Denmark, MD    Allergies Amoxicillin  Family History  Problem Relation Age of Onset  . Alcohol abuse Father   .  Melanoma Mother   . Breast cancer Mother   . Colon cancer Neg Hx   . Prostate cancer Neg Hx     Social History Social History   Tobacco Use  . Smoking status: Former Games developer  . Smokeless tobacco: Never Used  Vaping Use  . Vaping Use: Every day  Substance Use Topics  . Alcohol use: No  . Drug use: Not Currently    Types: IV, Cocaine    Comment: hx of heroin abuse (opioid addiction), crack    Review of Systems Constitutional: No fever/chills Eyes: No visual changes. ENT: No sore throat. Cardiovascular: Denies chest pain. Respiratory: Denies shortness of breath. Gastrointestinal: No abdominal pain.  No nausea,  no vomiting.  No diarrhea.  No constipation. Genitourinary: Negative for dysuria. Musculoskeletal: Negative for back pain. Skin: Negative for rash. Neurological: Negative for headaches, focal weakness or numbness. Endocrine:  Hypertension Allergic/Immunilogical: Amoxil  ____________________________________________   PHYSICAL EXAM:  VITAL SIGNS: ED Triage Vitals [01/10/21 1259]  Enc Vitals Group     BP 126/76     Pulse Rate 70     Resp 17     Temp 98.2 F (36.8 C)     Temp Source Oral     SpO2 99 %     Weight 190 lb (86.2 kg)     Height 5\' 9"  (1.753 m)     Head Circumference      Peak Flow      Pain Score 8     Pain Loc      Pain Edu?      Excl. in GC?    Constitutional: Alert and oriented. Well appearing and in no acute distress. Eyes: Conjunctivae are normal. PERRL. EOMI. Head: Atraumatic. Nose: No congestion/rhinnorhea. Mouth/Throat: Mucous membranes are moist.  Oropharynx non-erythematous. Neck: No stridor.  Hematological/Lymphatic/Immunilogical: No cervical lymphadenopathy. Cardiovascular: Normal rate, regular rhythm. Grossly normal heart sounds.  Good peripheral circulation. Respiratory: Normal respiratory effort.  No retractions. Lungs CTAB. Gastrointestinal: Soft and nontender. No distention. No abdominal bruits. No CVA tenderness. Musculoskeletal: No lower extremity tenderness nor edema.  No joint effusions. Neurologic:  Normal speech and language. No gross focal neurologic deficits are appreciated. No gait instability. Skin:  Skin is warm, dry and intact. No rash noted. Psychiatric: Mood and affect are normal. Speech and behavior are normal.  ____________________________________________   LABS (all labs ordered are listed, but only abnormal results are displayed)  Labs Reviewed  URINALYSIS, COMPLETE (UACMP) WITH MICROSCOPIC - Abnormal; Notable for the following components:      Result Value   Color, Urine STRAW (*)    APPearance CLEAR (*)    All other  components within normal limits  BASIC METABOLIC PANEL - Abnormal; Notable for the following components:   Glucose, Bld 101 (*)    All other components within normal limits  CBC   ____________________________________________  EKG   ____________________________________________  RADIOLOGY I, , personally viewed and evaluated these images (plain radiographs) as part of my medical decision making, as well as reviewing the written report by the radiologist.  ED MD interpretation:    Official radiology report(s): CT Renal Stone Study  Result Date: 01/10/2021 CLINICAL DATA:  Right flank pain. EXAM: CT ABDOMEN AND PELVIS WITHOUT CONTRAST TECHNIQUE: Multidetector CT imaging of the abdomen and pelvis was performed following the standard protocol without IV contrast. COMPARISON:  Feb 13, 2020. FINDINGS: Lower chest: No acute abnormality. Hepatobiliary: No focal liver abnormality is seen. No gallstones, gallbladder wall thickening, or  biliary dilatation. Pancreas: Unremarkable. No pancreatic ductal dilatation or surrounding inflammatory changes. Spleen: Normal in size without focal abnormality. Adrenals/Urinary Tract: Adrenal glands appear normal. Bilateral nonobstructive nephrolithiasis is noted. No hydronephrosis or renal obstruction is noted. Urinary bladder is unremarkable. Stomach/Bowel: The stomach appears normal. There is no evidence of bowel obstruction or inflammation. Status post appendectomy. Vascular/Lymphatic: No significant vascular findings are present. No enlarged abdominal or pelvic lymph nodes. Reproductive: Prostate is unremarkable. Other: No abdominal wall hernia or abnormality. No abdominopelvic ascites. Musculoskeletal: No acute or significant osseous findings. IMPRESSION: Bilateral nonobstructive nephrolithiasis. No hydronephrosis or renal obstruction is noted. No other abnormality seen in the abdomen or pelvis. Electronically Signed   By: Lupita Raider M.D.   On:  01/10/2021 14:25    ____________________________________________   PROCEDURES  Procedure(s) performed (including Critical Care):  Procedures   ____________________________________________   INITIAL IMPRESSION / ASSESSMENT AND PLAN / ED COURSE  As part of my medical decision making, I reviewed the following data within the electronic MEDICAL RECORD NUMBER         Patient presents with right flank pain with a history of kidney stones.  No hematuria noted on urinalysis.  CT scan revealed nonobstructive stones.  Patient given discharge care instructions and advised take medication as directed.  Follow-up with PCP.      ____________________________________________   FINAL CLINICAL IMPRESSION(S) / ED DIAGNOSES  Final diagnoses:  Nephrolithiasis     ED Discharge Orders         Ordered    ketorolac (TORADOL) 10 MG tablet  Every 6 hours PRN        01/10/21 1435          *Please note:  Randall Parsons was evaluated in Emergency Department on 01/10/2021 for the symptoms described in the history of present illness. He was evaluated in the context of the global COVID-19 pandemic, which necessitated consideration that the patient might be at risk for infection with the SARS-CoV-2 virus that causes COVID-19. Institutional protocols and algorithms that pertain to the evaluation of patients at risk for COVID-19 are in a state of rapid change based on information released by regulatory bodies including the CDC and federal and state organizations. These policies and algorithms were followed during the patient's care in the ED.  Some ED evaluations and interventions may be delayed as a result of limited staffing during and the pandemic.*   Note:  This document was prepared using Dragon voice recognition software and may include unintentional dictation errors.    Joni Reining, PA-C 01/10/21 1438    Sharman Cheek, MD 01/12/21 Ernestina [place]

## 2021-01-10 NOTE — ED Notes (Signed)
ED Provider at bedside. 

## 2021-01-10 NOTE — Discharge Instructions (Signed)
Stones are nonobstructive.  Should pass without incident.  Follow discharge care instruction take medication as directed.

## 2021-01-10 NOTE — ED Notes (Signed)
Discharge instructions reviewed with pt. Pt ambulatory upon discharge

## 2021-01-10 NOTE — ED Triage Notes (Signed)
Pt comes into the ED via POv c/o right flank pain that started this morning.  Pt seen 4 weeks ago for another kidney stone.  Pt went to PCP where they gave him a diuretic and now he is having worsening symptoms.  Pt ambulatory to triage at this time and in NAd with even and unlabored respirations.  Pt does have some nausea.

## 2021-01-22 ENCOUNTER — Telehealth: Payer: Self-pay

## 2021-01-22 ENCOUNTER — Other Ambulatory Visit: Payer: Self-pay

## 2021-01-22 ENCOUNTER — Telehealth (INDEPENDENT_AMBULATORY_CARE_PROVIDER_SITE_OTHER): Payer: 59 | Admitting: Family Medicine

## 2021-01-22 ENCOUNTER — Encounter: Payer: Self-pay | Admitting: Family Medicine

## 2021-01-22 ENCOUNTER — Other Ambulatory Visit (INDEPENDENT_AMBULATORY_CARE_PROVIDER_SITE_OTHER): Payer: 59

## 2021-01-22 ENCOUNTER — Other Ambulatory Visit: Payer: Self-pay | Admitting: Family Medicine

## 2021-01-22 DIAGNOSIS — R6883 Chills (without fever): Secondary | ICD-10-CM

## 2021-01-22 DIAGNOSIS — Z20822 Contact with and (suspected) exposure to covid-19: Secondary | ICD-10-CM | POA: Diagnosis not present

## 2021-01-22 NOTE — Telephone Encounter (Signed)
Was unable to contact pt by phone and sending note to [facility] for appt.

## 2021-01-22 NOTE — Telephone Encounter (Signed)
[facility] Night - Client TELEPHONE ADVICE RECORD [facility] Patient Name: Randall Parsons [facility] Gender: Male DOB: [birth year] Age: 40 Y 4 D Return Phone Number: [phone] (Primary), [phone] (Secondary) Address: City/ State/ Zip: [place] [address] Client [facility] Night - Client Client Site [facility] - Night Physician Gweneth Dimitri- MD Contact Type Call Who Is Calling Patient / Member / Family / Caregiver Call Type Triage / Clinical Relationship To Patient Self Return Phone Number [phone] (Primary) Chief Complaint Fever (non urgent symptom) (> THREE MONTHS) Reason for Call Request to Schedule Office Appointment Initial Comment Caller states he has been experiencing a fever for a few days. Caller states he has also been experiencing cold sweat and lethargy. Caller states he was diagnosed with tonsillitis. Caller would like to schedule an appointment. Translation No Nurse Assessment Nurse: Viviann Spare, RN, Tia Date/Time (Eastern Time): 01/22/2021 6:46:19 AM Confirm and document reason for call. If symptomatic, describe symptoms. ---Caller states he has been experiencing a fever for a few days. Caller states he has also been experiencing cold sweat and lethargy. Caller states he was diagnosed with tonsillitis. Caller would like to schedule an appointment. Does the patient have any new or worsening symptoms? ---No Please document clinical information provided and list any resource used. ---i asked him to call back after 8 am to set up an appt with the office. Disp. Time Lamount Cohen Time) Disposition Final User 01/22/2021 6:46:57 AM Clinical Call Yes Viviann Spare, RN, Tia

## 2021-01-22 NOTE — Patient Instructions (Signed)
#   Eat small frequent meals # Continue drinking plenty of water  Contact your LTR dentist to be seen

## 2021-01-22 NOTE — Progress Notes (Signed)
I connected with Randall Parsons on 01/22/21 at  4:00 PM EDT by video and verified that I am speaking with the correct person using two identifiers.   I discussed the limitations, risks, security and privacy concerns of performing an evaluation and management service by video and the availability of in person appointments. I also discussed with the patient that there may be a patient responsible charge related to this service. The patient expressed understanding and agreed to proceed.  Patient location: Home  Provider Location: [facility] Participants: Randall Parsons and Randall Parsons and wife   Subjective:     Randall Parsons is a 40 y.o. male presenting for Chills (X 1 week. Oral surgery on 4/8. Pt describes that it was "violent" ), Fever (At home covid test on 4/13 was negative ), and Headache     HPI   #Fever/chills - x 1 week - nausea - low appetite - loss of 10 lbs - pain where he had 2 teeth removed by an oral surgeon (4/8) - has had 2 f/u appointment for the pain and told everything looked good and they did pack the area and this packing came out quickly. Then worsening pain, fever, chills, puffiness at the site.  - told he may have a stomach virus and prescribed anti-nausea medication - continued to have fever/chills, upset stomach - went to [facility] and went to urgent care - diagnosed with tonsillitis  - given steroids and azithromycin -- improvement in swallowing and the swelling improved - still getting cold sweats and goose-bumps - Antibiotic 4/14 and steroids 4/15 - completed abx today  - steroids stopped today - thought it might be contributing to his symptoms  - will get calmly, cold sweats, and chills like he has a fever and low appetite  - persistent low appetite  - dental pain persists  - sick contact: no - no loss of taste or smell  Continues to feel fatigued and have poor appetite Hydration - 6 bottles of water per day Was having gag reflexes      Review of Systems  HENT: Negative for congestion.   Respiratory: Negative for cough.   Gastrointestinal: Positive for abdominal pain and nausea. Negative for constipation and diarrhea.     Social History   Tobacco Use  Smoking Status Former Smoker  Smokeless Tobacco Never Used        Objective:   BP Readings from Last 3 Encounters:  01/10/21 120/77  12/18/20 120/70  12/13/20 140/87   Wt Readings from Last 3 Encounters:  01/10/21 190 lb (86.2 kg)  12/18/20 189 lb (85.7 kg)  12/13/20 190 lb (86.2 kg)   There were no vitals taken for this visit.   Physical Exam Constitutional:      Appearance: Normal appearance. He is not ill-appearing.  HENT:     Head: Normocephalic and atraumatic.     Right Ear: External ear normal.     Left Ear: External ear normal.  Eyes:     Conjunctiva/sclera: Conjunctivae normal.  Pulmonary:     Effort: Pulmonary effort is normal. No respiratory distress.  Neurological:     Mental Status: He is alert. Mental status is at baseline.  Psychiatric:        Mood and Affect: Mood normal.        Behavior: Behavior normal.        Thought Content: Thought content normal.        Judgment: Judgment normal.  Assessment & Plan:   Problem List Items Addressed This Visit   None   Visit Diagnoses    Chills    -  Primary     Etiology unclear. He notes 1 week of nausea, poor appetite, and chills but no recorded fever recently. Is finishing a course of steroids and azithromycin with improvement in sore throat.   Recent dental surgery with visits x 2 without concern for infection at site.   Advised watch and wait. Encouraged need for eating regularly to help with fatigue. Declined nausea medication as this is not impacting his overall appetite.   Covid testing to rule out. Suspect likely viral syndrome. Out of work until results. However, advise 2nd opinion regarding oral health.   Advised getting thermometer to determine  whether cold sweats are associated with fevers.   If persisting may consider abx with oral coverage as only had azithromycin.   Return if symptoms worsen or fail to improve.  Randall Child, MD

## 2021-01-23 LAB — NOVEL CORONAVIRUS, NAA: SARS-CoV-2, NAA: NOT DETECTED

## 2021-01-23 LAB — SARS-COV-2, NAA 2 DAY TAT

## 2021-02-06 ENCOUNTER — Ambulatory Visit: Payer: 59 | Admitting: Family Medicine

## 2021-02-09 ENCOUNTER — Emergency Department: Payer: 59

## 2021-02-09 ENCOUNTER — Emergency Department
Admission: EM | Admit: 2021-02-09 | Discharge: 2021-02-09 | Disposition: A | Discharge status: Home / Self Care | Payer: 59 | Attending: Emergency Medicine | Admitting: Emergency Medicine

## 2021-02-09 DIAGNOSIS — R42 Dizziness and giddiness: Secondary | ICD-10-CM | POA: Diagnosis not present

## 2021-02-09 DIAGNOSIS — I110 Hypertensive heart disease with heart failure: Secondary | ICD-10-CM | POA: Insufficient documentation

## 2021-02-09 DIAGNOSIS — I509 Heart failure, unspecified: Secondary | ICD-10-CM | POA: Insufficient documentation

## 2021-02-09 DIAGNOSIS — Z8616 Personal history of COVID-19: Secondary | ICD-10-CM | POA: Diagnosis not present

## 2021-02-09 DIAGNOSIS — Z79899 Other long term (current) drug therapy: Secondary | ICD-10-CM | POA: Diagnosis not present

## 2021-02-09 DIAGNOSIS — R0602 Shortness of breath: Secondary | ICD-10-CM | POA: Diagnosis not present

## 2021-02-09 DIAGNOSIS — R5383 Other fatigue: Secondary | ICD-10-CM | POA: Diagnosis not present

## 2021-02-09 DIAGNOSIS — Z20822 Contact with and (suspected) exposure to covid-19: Secondary | ICD-10-CM | POA: Insufficient documentation

## 2021-02-09 DIAGNOSIS — Z87891 Personal history of nicotine dependence: Secondary | ICD-10-CM | POA: Insufficient documentation

## 2021-02-09 LAB — D-DIMER, QUANTITATIVE: D-Dimer, Quant: <0.27 ug/mL-FEU (ref 0.00–0.50)

## 2021-02-09 LAB — CBC
HCT: 41.6 % (ref 39.0–52.0)
Hemoglobin: 13.9 g/dL (ref 13.0–17.0)
MCH: 29.2 pg (ref 26.0–34.0)
MCHC: 33.4 g/dL (ref 30.0–36.0)
MCV: 87.4 fL (ref 80.0–100.0)
Platelets: 300 10*3/uL (ref 150–400)
RBC: 4.76 MIL/uL (ref 4.22–5.81)
RDW: 13.8 % (ref 11.5–15.5)
WBC: 4.1 10*3/uL (ref 4.0–10.5)
nRBC: 0 % (ref 0.0–0.2)

## 2021-02-09 LAB — RESP PANEL BY RT-PCR (FLU A&B, COVID) ARPGX2
Influenza A by PCR: NEGATIVE
Influenza B by PCR: NEGATIVE
SARS Coronavirus 2 by RT PCR: NEGATIVE

## 2021-02-09 LAB — BASIC METABOLIC PANEL
Anion gap: 8 (ref 5–15)
BUN: 16 mg/dL (ref 6–20)
CO2: 25 mmol/L (ref 22–32)
Calcium: 9.1 mg/dL (ref 8.9–10.3)
Chloride: 106 mmol/L (ref 98–111)
Creatinine, Ser: 1.06 mg/dL (ref 0.61–1.24)
GFR, Estimated: >60 mL/min (ref >60)
Glucose, Bld: 84 mg/dL (ref 70–99)
Potassium: 3.9 mmol/L (ref 3.5–5.1)
Sodium: 139 mmol/L (ref 135–145)

## 2021-02-09 LAB — TROPONIN I (HIGH SENSITIVITY): Troponin I (High Sensitivity): <2 ng/L (ref <18)

## 2021-02-09 MED ORDER — IPRATROPIUM-ALBUTEROL 0.5-2.5 (3) MG/3ML IN SOLN
3.0000 mL | Freq: Once | RESPIRATORY_TRACT | Status: AC
  Administered 2021-02-09: 3 mL via RESPIRATORY_TRACT
  Filled 2021-02-09: qty 3

## 2021-02-09 MED ORDER — PREDNISONE 50 MG PO TABS: 50.0000 mg | ORAL_TABLET | Freq: Every day | ORAL | 0 refills | Status: DC

## 2021-02-09 MED ORDER — SODIUM CHLORIDE 0.9% FLUSH
3.0000 mL | Freq: Once | INTRAVENOUS | Status: AC
  Administered 2021-02-09: 3 mL via INTRAVENOUS

## 2021-02-09 NOTE — ED Triage Notes (Signed)
This am dizziness , nausea, pt was working out

## 2021-02-09 NOTE — ED Provider Notes (Signed)
[facility] Emergency Department Provider Note   ____________________________________________    I have reviewed the triage vital signs and the nursing notes.   HISTORY  Chief Complaint Lightheadedness, shortness of breath    HPI Randall Parsons is a 40 y.o. male who presents after becoming dizzy while working out this morning.  Patient reports yesterday he felt extremely fatigued.  Today he also felt fatigued but started his daily morning workout but afterwards he felt very short of breath and lightheaded.  He did feel some mild chest tightness.  Primarily though he was unable to catch his breath so his wife called EMS.  No history of asthma, does not smoke, does vape.  No calf pain or swelling.  No pleurisy.  Past Medical History:  Diagnosis Date  . CHF (congestive heart failure) (HCC)   . Exposure to hepatitis B   . Exposure to hepatitis C   . Hepatitis C   . 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, ending around 2017  . Hypertension   . IV drug abuse (HCC)   . Renal disorder    kidney stones    Patient Active Problem List   Diagnosis Date Noted  . COVID-19 vaccine dose declined 12/20/2020  . Hepatitis C test positive 12/20/2020  . Prolactin increased 12/20/2020  . Advance care planning 12/20/2020  . GAD (generalized anxiety disorder) 04/04/2020  . Attention deficit hyperactivity disorder, combined type 04/04/2020  . Opioid type dependence, continuous (HCC) 04/04/2020  . Major depressive disorder 07/14/2019  . Alcohol abuse 07/14/2019  . Cocaine abuse (HCC) 07/12/2019  . NICM (nonischemic cardiomyopathy) (HCC) 11/28/2017  . Renal stone 11/28/2017    Past Surgical History:  Procedure Laterality Date  . APPENDECTOMY    . EYE SURGERY     secondary to dog bite  . TIBIA FRACTURE SURGERY Right     Prior to Admission medications   Medication Sig Start Date End Date Taking? Authorizing Provider  predniSONE (DELTASONE) 50 MG  tablet Take 1 tablet (50 mg total) by mouth daily with breakfast. 02/09/21  Yes Jene Every, MD  amphetamine-dextroamphetamine (ADDERALL) 20 MG tablet Take 20 mg by mouth 2 (two) times daily. 11/20/20   [provider]  azithromycin (ZITHROMAX) 250 MG tablet TAKE 2 TABLETS BY MOUTH TODAY, THEN TAKE 1 TABLET DAILY FOR 4 DAYS 01/18/21   [provider]  buPROPion (WELLBUTRIN XL) 300 MG 24 hr tablet Take 300 mg by mouth daily.    [provider]  diazepam (VALIUM) 5 MG tablet Take 5 mg by mouth 4 (four) times daily as needed. 12/14/20   [provider]  doxepin (SINEQUAN) 10 MG capsule Take 1 capsule (10 mg total) by mouth at bedtime as needed. 12/18/20   Joaquim Nam, MD  gabapentin (NEURONTIN) 300 MG capsule Take 2 capsules (600 mg total) by mouth 3 (three) times daily as needed. 12/18/20   Joaquim Nam, MD  hydrochlorothiazide (MICROZIDE) 12.5 MG capsule Take 1 capsule (12.5 mg total) by mouth daily. 12/27/20   Joaquim Nam, MD  ketorolac (TORADOL) 10 MG tablet Take 1 tablet (10 mg total) by mouth every 6 (six) hours as needed. 01/10/21   Joni Reining, PA-C  lamoTRIgine (LAMICTAL) 200 MG tablet Take 1 tablet (200 mg total) by mouth daily. 12/18/20 03/18/21  Joaquim Nam, MD  SUBLOCADE 100 MG/0.5ML SOSY Inject 100 mg into the skin every 28 (twenty-eight) days. 11/20/20   [provider]  pantoprazole (PROTONIX) 40 MG tablet Take 1 tablet (40 mg total) by mouth daily. 07/17/19 08/03/19  Clapacs, Jackquline Denmark, MD     Allergies Amoxicillin  Family History  Problem Relation Age of Onset  . Alcohol abuse Father   . Melanoma Mother   . Breast cancer Mother   . Colon cancer Neg Hx   . Prostate cancer Neg Hx     Social History Social History   Tobacco Use  . Smoking status: Former Games developer  . Smokeless tobacco: Never Used  Vaping Use  . Vaping Use: Every day  Substance Use Topics  . Alcohol use: No  . Drug use: Not Currently    Types: IV,  Cocaine    Comment: hx of heroin abuse (opioid addiction), crack    Review of Systems  Constitutional: Hot flashes, fatigue Eyes: No visual changes.  ENT: No sore throat. Cardiovascular: As Respiratory: As above Gastrointestinal: No abdominal pain.  No nausea, no vomiting.   Genitourinary: Negative for dysuria. Musculoskeletal: Negative for back pain. Skin: Negative for rash. Neurological: Negative for headaches    ____________________________________________   PHYSICAL EXAM:  VITAL SIGNS: ED Triage Vitals  Enc Vitals Group     BP 02/09/21 0728 102/78     Pulse Rate 02/09/21 0728 (!) 105     Resp 02/09/21 0728 18     Temp 02/09/21 0728 98.1 F (36.7 C)     Temp Source 02/09/21 0728 Oral     SpO2 02/09/21 0728 98 %     Weight 02/09/21 0729 90 kg (198 lb 6.6 oz)     Height 02/09/21 0729 1.753 m (5\' 9" )     Head Circumference --      Peak Flow --      Pain Score 02/09/21 0729 0     Pain Loc --      Pain Edu? --      Excl. in GC? --     Constitutional: Alert and oriented.   Nose: No congestion/rhinnorhea. Mouth/Throat: Mucous membranes are moist.   Neck:  Painless ROM Cardiovascular: Normal rate, regular rhythm. Grossly normal heart sounds.  Good peripheral circulation. Respiratory: Normal respiratory effort.  No retractions. Lungs CTAB. Gastrointestinal: Soft and nontender. No distention.  No CVA tenderness.  Musculoskeletal: No lower extremity tenderness nor edema.  Warm and well perfused Neurologic:  Normal speech and language. No gross focal neurologic deficits are appreciated.  Skin:  Skin is warm, dry and intact. No rash noted. Psychiatric: Mood and affect are normal. Speech and behavior are normal.  ____________________________________________   LABS (all labs ordered are listed, but only abnormal results are displayed)  Labs Reviewed  RESP PANEL BY RT-PCR (FLU A&B, COVID) [id number]  BASIC METABOLIC PANEL  CBC  D-DIMER, QUANTITATIVE  TROPONIN I (HIGH  SENSITIVITY)   ____________________________________________  EKG  ED ECG REPORT I, 04/11/21, the attending physician, personally viewed and interpreted this ECG.  Date: 02/09/2021  Rhythm: normal sinus rhythm QRS Axis: normal Intervals: normal ST/T Wave abnormalities: normal Narrative Interpretation: no evidence of acute ischemia  ____________________________________________  RADIOLOGY  Reviewed by me, no acute abnormality ____________________________________________   PROCEDURES  Procedure(s) performed: No  .1-3 Lead EKG Interpretation Performed by: 04/11/2021, MD Authorized by: Jene Every, MD     Interpretation: normal     ECG rate assessment: normal     Rhythm: sinus rhythm     Ectopy: none     Conduction: normal       Critical  Care performed: No ____________________________________________   INITIAL IMPRESSION / ASSESSMENT AND PLAN / ED COURSE  Pertinent labs & imaging results that were available during my care of the patient were reviewed by me and considered in my medical decision making (see chart for details).  Patient presents with fatigue, shortness of breath after working out.  No current wheezing now however he does feel that he cannot take a full breath as usual will treat with DuoNeb in case bronchospasm was responsible for his symptoms.  Given fatigue yesterday COVID is on the differential as well, patient is afebrile here.  Will check cardiac markers, x-ray, give DuoNeb, check COVID test and monitor   High sensitive troponin undetectable, D-dimer normal.  BMP CBC unremarkable.  COVID test negative.  Chest x-ray without acute abnormality.  Patient feeling better after DuoNeb  Do suspect bronchospasm possibly related to vaping and or developing viral illness.  Will start on prednisone burst, strict return precautions discussed, patient agrees with this plan.   ____________________________________________   FINAL CLINICAL  IMPRESSION(S) / ED DIAGNOSES  Final diagnoses:  Dizziness  Shortness of breath  Fatigue, unspecified type        Note:  This document was prepared using Dragon voice recognition software and may include unintentional dictation errors.   Jene Every, MD 02/09/21 [phone]

## 2021-02-16 ENCOUNTER — Encounter: Payer: Self-pay | Admitting: Family Medicine

## 2021-02-16 ENCOUNTER — Other Ambulatory Visit: Payer: Self-pay

## 2021-02-16 ENCOUNTER — Ambulatory Visit (INDEPENDENT_AMBULATORY_CARE_PROVIDER_SITE_OTHER): Payer: 59 | Admitting: Family Medicine

## 2021-02-16 DIAGNOSIS — F902 Attention-deficit hyperactivity disorder, combined type: Secondary | ICD-10-CM

## 2021-02-16 DIAGNOSIS — J9801 Acute bronchospasm: Secondary | ICD-10-CM | POA: Diagnosis not present

## 2021-02-16 MED ORDER — ALBUTEROL SULFATE HFA 108 (90 BASE) MCG/ACT IN AERS: 1.0000 | INHALATION_SPRAY | Freq: Four times a day (QID) | RESPIRATORY_TRACT | 0 refills | Status: DC | PRN

## 2021-02-16 NOTE — Progress Notes (Signed)
This visit occurred during the SARS-CoV-2 public health emergency.  Safety protocols were in place, including screening questions prior to the visit, additional usage of staff PPE, and extensive cleaning of exam room while observing appropriate contact time as indicated for disinfecting solutions.  Prev TSH wnl x2, d/w pt. no need to recheck at this point.  vaping d/w pt.  ER f/u for SOB.  He was SOB with exercise.  Single event.  He quit vaping and improved.  He is off prednisone- he was irritable on med.  He has exercised in the meantime w/o troubles.  Neb tx at ER clearly helped, w/o ADE on med.   He is off adderall.  He is still on diazepam.  He was asking about options since his current psych clinic doesn't do both-Per report of patient.  I need record release, he was to sign that at the front today after finishing the office visit with me.  I didn't make specific changes in meds at this point.  He denies recent illicits.  I did not make any specific recommendation or guarantee about prescribing his medications in the future.  Meds, vitals, and allergies reviewed.   ROS: Per HPI unless specifically indicated in ROS section   GEN: nad, alert and oriented HEENT: ncat NECK: supple w/o LA CV: rrr PULM: ctab, no inc wob ABD: soft, +bs EXT: no edema SKIN: Well-perfused.

## 2021-02-16 NOTE — Patient Instructions (Addendum)
Please get the front to set up a record release for your ADHD/anxiety records for the last year. We'll go from there.  If you have more symptoms, then try using albuterol.  If needed more than twice a week, let me know.  If can make you jittery.   Take care.  Glad to see you.

## 2021-02-16 NOTE — Progress Notes (Signed)
Formatting of this note might be different from the original.  This visit occurred during the SARS-CoV-2 public health emergency.  Safety protocols were in place, including screening questions prior to the visit, additional usage of staff PPE, and extensive cleaning of exam room while observing appropriate contact time as indicated for disinfecting solutions.    Prev TSH wnl x2, d/w pt. no need to recheck at this point.    vaping d/w pt.  ER f/u for SOB.  He was SOB with exercise.  Single event.  He quit vaping and improved.  He is off prednisone- he was irritable on med.  He has exercised in the meantime w/o troubles.  Neb tx at ER clearly helped, w/o ADE on med.     He is off adderall.  He is still on diazepam.  He was asking about options since his current psych clinic doesn't do both-Per report of patient.  I need record release, he was to sign that at the front today after finishing the office visit with me.  I didn't make specific changes in meds at this point.  He denies recent illicits.  I did not make any specific recommendation or guarantee about prescribing his medications in the future.    Meds, vitals, and allergies reviewed.     ROS: Per HPI unless specifically indicated in ROS section     GEN: nad, alert and oriented  HEENT: ncat  NECK: supple w/o LA  CV: rrr  PULM: ctab, no inc wob  ABD: soft, +bs  EXT: no edema  SKIN: Well-perfused.    Electronically signed by Joaquim Nam, MD at 02/18/2021 11:30 PM EDT

## 2021-02-18 DIAGNOSIS — J9801 Acute bronchospasm: Secondary | ICD-10-CM | POA: Insufficient documentation

## 2021-02-18 NOTE — Assessment & Plan Note (Addendum)
Likely the issue, likely exacerbated by vaping.  Advised not to vape.  He can use albuterol as needed and update me as needed.  Lungs are clear.  Okay for outpatient follow-up.  Routine cautions given to patient.

## 2021-02-18 NOTE — Assessment & Plan Note (Signed)
I did not make any specific recommendation to guarantee about prescribing his medications in the future.  I can review his psychiatric records when they arrive and we can go from there.

## 2021-02-18 NOTE — Assessment & Plan Note (Signed)
Associated Problem(s): Attention deficit hyperactivity disorder, combined type  Formatting of this note might be different from the original.  I did not make any specific recommendation to guarantee about prescribing his medications in the future.  I can review his psychiatric records when they arrive and we can go from there.  Electronically signed by Joaquim Nam, MD at 02/18/2021 11:29 PM EDT

## 2021-02-18 NOTE — Assessment & Plan Note (Signed)
Associated Problem(s): Bronchospasm  Formatting of this note might be different from the original.  Likely the issue, likely exacerbated by vaping.  Advised not to vape.  He can use albuterol as needed and update me as needed.  Lungs are clear.  Okay for outpatient follow-up.  Routine cautions given to patient.  Electronically signed by Joaquim Nam, MD at 02/18/2021 11:30 PM EDT

## 2021-03-01 ENCOUNTER — Telehealth: Payer: Self-pay | Admitting: Family Medicine

## 2021-03-01 NOTE — Telephone Encounter (Signed)
Randall Parsons called in wanted to know if can get something for his allergies and if he can get an Testerone boost.   Please advise

## 2021-03-05 NOTE — Telephone Encounter (Signed)
He can try over-the-counter Claritin 10 mg a day.  I would not take Claritin-D. I do not know what he means by a testosterone boost.  If he is having specific symptoms or concerns then he would need an appointment about that.  I very rarely (and under very specific circumstances) check testosterone levels.  If the patient has low testosterone level that needs treatment that usually comes through urology.  I will not check a testosterone level without an attributable symptom.

## 2021-03-06 NOTE — Telephone Encounter (Signed)
I don't call seeing his papers yet.  I have had had mult papers come in recently and I am working on those as time allows.

## 2021-03-06 NOTE — Telephone Encounter (Signed)
Patient advised to try taking claritin and no claritin -D; patient verbalized understanding. Patient was just having a lot of fatigue and is better right now. I advised he needs to make an appt to discuss this in the future and patient agrees to do so. Patient was also wondering if we had gotten his previous records that were requested? I saw the release and was faxed off but nothing scanned in yet; I know scan is behind and can take some time so I was unsure if you had them or seen them yet?

## 2021-05-09 ENCOUNTER — Emergency Department: Payer: 59

## 2021-05-09 ENCOUNTER — Telehealth: Payer: Self-pay | Admitting: Family Medicine

## 2021-05-09 ENCOUNTER — Encounter: Payer: Self-pay | Admitting: Emergency Medicine

## 2021-05-09 ENCOUNTER — Telehealth: Payer: Self-pay

## 2021-05-09 ENCOUNTER — Other Ambulatory Visit: Payer: Self-pay

## 2021-05-09 ENCOUNTER — Emergency Department
Admission: EM | Admit: 2021-05-09 | Discharge: 2021-05-09 | Disposition: A | Discharge status: Home / Self Care | Payer: 59 | Attending: Emergency Medicine | Admitting: Emergency Medicine

## 2021-05-09 DIAGNOSIS — F419 Anxiety disorder, unspecified: Secondary | ICD-10-CM | POA: Diagnosis not present

## 2021-05-09 DIAGNOSIS — R079 Chest pain, unspecified: Secondary | ICD-10-CM | POA: Diagnosis present

## 2021-05-09 DIAGNOSIS — F17290 Nicotine dependence, other tobacco product, uncomplicated: Secondary | ICD-10-CM | POA: Diagnosis not present

## 2021-05-09 DIAGNOSIS — R0602 Shortness of breath: Secondary | ICD-10-CM | POA: Diagnosis not present

## 2021-05-09 DIAGNOSIS — R0789 Other chest pain: Secondary | ICD-10-CM | POA: Diagnosis not present

## 2021-05-09 DIAGNOSIS — I110 Hypertensive heart disease with heart failure: Secondary | ICD-10-CM | POA: Insufficient documentation

## 2021-05-09 DIAGNOSIS — I509 Heart failure, unspecified: Secondary | ICD-10-CM | POA: Diagnosis not present

## 2021-05-09 DIAGNOSIS — Z7289 Other problems related to lifestyle: Secondary | ICD-10-CM

## 2021-05-09 DIAGNOSIS — Z79899 Other long term (current) drug therapy: Secondary | ICD-10-CM | POA: Diagnosis not present

## 2021-05-09 LAB — BASIC METABOLIC PANEL
Anion gap: 8 (ref 5–15)
BUN: 17 mg/dL (ref 6–20)
CO2: 28 mmol/L (ref 22–32)
Calcium: 9.4 mg/dL (ref 8.9–10.3)
Chloride: 102 mmol/L (ref 98–111)
Creatinine, Ser: 1.27 mg/dL — ABNORMAL HIGH (ref 0.61–1.24)
GFR, Estimated: >60 mL/min (ref >60)
Glucose, Bld: 98 mg/dL (ref 70–99)
Potassium: 4.1 mmol/L (ref 3.5–5.1)
Sodium: 138 mmol/L (ref 135–145)

## 2021-05-09 LAB — CBC
HCT: 42.6 % (ref 39.0–52.0)
Hemoglobin: 14.2 g/dL (ref 13.0–17.0)
MCH: 30.6 pg (ref 26.0–34.0)
MCHC: 33.3 g/dL (ref 30.0–36.0)
MCV: 91.8 fL (ref 80.0–100.0)
Platelets: 267 10*3/uL (ref 150–400)
RBC: 4.64 MIL/uL (ref 4.22–5.81)
RDW: 13.7 % (ref 11.5–15.5)
WBC: 4.7 10*3/uL (ref 4.0–10.5)
nRBC: 0 % (ref 0.0–0.2)

## 2021-05-09 LAB — TROPONIN I (HIGH SENSITIVITY): Troponin I (High Sensitivity): 3 ng/L (ref <18)

## 2021-05-09 MED ORDER — ALBUTEROL SULFATE HFA 108 (90 BASE) MCG/ACT IN AERS: 1.0000 | INHALATION_SPRAY | Freq: Four times a day (QID) | RESPIRATORY_TRACT | 0 refills | Status: DC | PRN

## 2021-05-09 NOTE — ED Triage Notes (Signed)
Pt to ED via POV stating that he has been having chest pain and shortness of breath since about 0615 this morning. Pt reports that he has had similar events in the past. Pt states that he was told to stop vaping when he did the pain improved. Pt states that he recently started vaping again. Pt reports that he is also short of breath. Pt states that he took 800 mg of ibuprofen this morning for the pain and also took 5 mg of diazepam for anxiety. Pt is in NAD and is able to speak in complete sentences at this time.

## 2021-05-09 NOTE — Telephone Encounter (Signed)
Refill sent.

## 2021-05-09 NOTE — Telephone Encounter (Signed)
Will await ER note.  Thanks.  

## 2021-05-09 NOTE — Telephone Encounter (Signed)
  Encourage patient to contact the pharmacy for refills or they can request refills through [facility]  LAST APPOINTMENT DATE:  Please schedule appointment if longer than 1 year  NEXT APPOINTMENT DATE:  MEDICATION: albuterol (VENTOLIN HFA) 108 (90 Base) MCG/ACT inhaler  Is the patient out of medication? yes  PHARMACY: [facility] [place]  Let patient know to contact pharmacy at the end of the day to make sure medication is ready.  Please notify patient to allow 48-72 hours to process  CLINICAL FILLS OUT ALL BELOW:   LAST REFILL:  QTY:  REFILL DATE:    OTHER COMMENTS:    Okay for refill?  Please advise

## 2021-05-09 NOTE — Telephone Encounter (Signed)
[facility] Night - Client TELEPHONE ADVICE RECORD [facility] Patient Name: Randall Parsons [facility] Gender: Male DOB: [birth year] Age: 40 Y 3 M 20 D Return Phone Number: [phone] (Primary), [phone] (Secondary) Address: City/ State/ Zip: [place] [address] Client [facility] Night - Client Client Site [facility] - Night Physician Gweneth Dimitri- MD Contact Type Call Who Is Calling Patient / Member / Family / Caregiver Call Type Triage / Clinical Relationship To Patient Self Return Phone Number [phone] (Primary) Chief Complaint Prescription Refill or Medication Request (non symptomatic) Reason for Call Medication Question / Request Initial Comment Caller states that he needs his albuterol inhaler medication to be called in. Translation No Nurse Assessment Nurse: Alexander Mt, RN, Nicholaus Bloom Date/Time (Eastern Time): 05/09/2021 7:08:10 AM Confirm and document reason for call. If symptomatic, describe symptoms. ---Caller states he is having chest pain and is doing breathing tx at home but is needing his albuterol inhaler called in. States he is having tightness in the center of his chest that comes and goes. Does the patient have any new or worsening symptoms? ---Yes Will a triage be completed? ---Yes Related visit to physician within the last 2 weeks? ---No Does the PT have any chronic conditions? (i.e. diabetes, asthma, this includes High risk factors for pregnancy, etc.) ---Yes List chronic conditions. ---asthma, depression Is this a behavioral health or substance abuse call? ---No Guidelines Guideline Title Affirmed Question Affirmed Notes Nurse Date/Time Lamount Cohen Time) Asthma Attack Chest pain Alexander Mt, RN, Nicholaus Bloom 05/09/2021 7:10:26 AM Disp. Time Lamount Cohen Time) Disposition Final User 05/09/2021 7:13:55 AM Go to ED Now Yes Alexander Mt, RN, Erasmo Score NOTE: All timestamps contained within this report are  represented as [place] Standard Time. CONFIDENTIALTY NOTICE: This fax transmission is intended only for the addressee. It contains information that is legally privileged, confidential or otherwise protected from use or disclosure. If you are not the intended recipient, you are strictly prohibited from reviewing, disclosing, copying using or disseminating any of this information or taking any action in reliance on or regarding this information. If you have received this fax in error, please notify us immediately by telephone so that we can arrange for its return to [place]. Phone: [phone], Toll-Free: [phone], Fax: [phone] Page: 2 of 2 Call Id: [id number] Caller Disagree/Comply Comply Caller Understands Yes PreDisposition Home Care Care Advice Given Per Guideline GO TO ED NOW: * You need to be seen in the Emergency Department. * Go to the ED at ___________ Hospital. * Leave now. Drive carefully. NOTE TO TRIAGER - DRIVING: * Another adult should drive. * Patient should not delay going to the emergency department. * If immediate transportation is not available via car, rideshare (e.g., Lyft, Uber), or taxi, then the patient should be instructed to call [id number]. ANOTHER ADULT SHOULD DRIVE: * It is better and safer if another adult drives instead of you. * During an ASTHMA ATTACK use your SHORT-ACTING QUICK-RELIEF INHALER (4 puffs, such as ALBUTEROL) or nebulizer up to 3 times, every 20 minutes as needed. Take 4 to 6 puffs of your quick-relief ALBUTEROL (SALBUTAMOL) INHALER right now. Repeat every 15 to 20 minutes. ASTHMA ATTACK - TREATMENT - QUICK-RELIEF MEDICINE: BRING MEDICINES: * Please bring a list of your current medicines when you go to see the doctor. * It is also a good idea to bring the pill bottles too. This will help the doctor to make certain you are taking the right medicines and the right dose. CARE ADVICE given  per Asthma Attack (Adult) guideline. Referrals GO TO FACILITY  REFUSED

## 2021-05-09 NOTE — Discharge Instructions (Addendum)
Stop vaping

## 2021-05-09 NOTE — Telephone Encounter (Signed)
Per chart review tab pt has gone to [facility] ED.

## 2021-05-09 NOTE — ED Provider Notes (Signed)
[facility] Emergency Department Provider Note ____________________________________________   Event Date/Time   First MD Initiated Contact with Patient 05/09/21 [phone]     (approximate)  I have reviewed the triage vital signs and the nursing notes.  HISTORY  Chief Complaint Chest Pain and Shortness of Breath   HPI Randall Parsons is a 40 y.o. malewho presents to the ED for evaluation of chest pain/SOB.   Chart review indicates hx polysubstance abuse.  Anxiety disorder on as needed diazepam.  Patient presents to the ED due to chest pain and shortness of breath just began this morning.  He reports feeling normal yesterday.  Patient reports awakening this morning at his normal time for work, with sharp chest pains and associated sensation of dyspnea.  He reports using his albuterol inhaler, which worsened his symptoms.  He then reports taking 800 mg of ibuprofen and 5 mg of diazepam, slowly improving symptoms.  By the time that I have seen the patient, 3-4 hours after his symptoms have started, he reports feeling better now and feels okay.  Denies any complaints.  Reports that he just wants to go home and get some rest as he is already called out from work today.  Patient further electively elaborates on his stressors at work with his job.  Reports feeling quite anxious due to his new job being rather demanding.  Denies any nausea, emesis, syncopal episodes or dizziness, recent illnesses.  Past Medical History:  Diagnosis Date   CHF (congestive heart failure) (HCC)    Exposure to hepatitis B    Exposure to hepatitis C    Hepatitis C    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, ending around 2017   Hypertension    IV drug abuse (HCC)    Renal disorder    kidney stones    Patient Active Problem List   Diagnosis Date Noted   Bronchospasm 02/18/2021   COVID-19 vaccine dose declined 12/20/2020   Hepatitis C test positive 12/20/2020   Prolactin  increased 12/20/2020   Advance care planning 12/20/2020   GAD (generalized anxiety disorder) 04/04/2020   Attention deficit hyperactivity disorder, combined type 04/04/2020   Opioid type dependence, continuous (HCC) 04/04/2020   Major depressive disorder 07/14/2019   Alcohol abuse 07/14/2019   Cocaine abuse (HCC) 07/12/2019   NICM (nonischemic cardiomyopathy) (HCC) 11/28/2017   Renal stone 11/28/2017   History of seizure 03/14/2017   IVDU (intravenous drug user) 03/14/2017   Other specified abnormal immunological findings in serum 12/21/2015    Past Surgical History:  Procedure Laterality Date   APPENDECTOMY     EYE SURGERY     secondary to dog bite   TIBIA FRACTURE SURGERY Right     Prior to Admission medications   Medication Sig Start Date End Date Taking? Authorizing Provider  albuterol (VENTOLIN HFA) 108 (90 Base) MCG/ACT inhaler Inhale 1-2 puffs into the lungs every 6 (six) hours as needed for wheezing or shortness of breath. 02/16/21   Joaquim Nam, MD  buPROPion (WELLBUTRIN XL) 300 MG 24 hr tablet Take 300 mg by mouth daily.    [provider]  diazepam (VALIUM) 5 MG tablet Take 5 mg by mouth 4 (four) times daily as needed. 12/14/20   [provider]  doxepin (SINEQUAN) 10 MG capsule Take 1 capsule (10 mg total) by mouth at bedtime as needed. 12/18/20   Joaquim Nam, MD  gabapentin (NEURONTIN) 300 MG capsule Take 2 capsules (600  mg total) by mouth 3 (three) times daily as needed. 12/18/20   Joaquim Nam, MD  hydrochlorothiazide (MICROZIDE) 12.5 MG capsule Take 1 capsule (12.5 mg total) by mouth daily. 12/27/20   Joaquim Nam, MD  ketorolac (TORADOL) 10 MG tablet Take 1 tablet (10 mg total) by mouth every 6 (six) hours as needed. 01/10/21   Joni Reining, PA-C  lamoTRIgine (LAMICTAL) 200 MG tablet Take 1 tablet (200 mg total) by mouth daily. 12/18/20 03/18/21  Joaquim Nam, MD  SUBLOCADE 100 MG/0.5ML SOSY Inject 100 mg into the skin every 28  (twenty-eight) days. 11/20/20   [provider]  pantoprazole (PROTONIX) 40 MG tablet Take 1 tablet (40 mg total) by mouth daily. 07/17/19 08/03/19  Clapacs, Jackquline Denmark, MD    Allergies Amoxicillin and Prednisone  Family History  Problem Relation Age of Onset   Alcohol abuse Father    Melanoma Mother    Breast cancer Mother    Colon cancer Neg Hx    Prostate cancer Neg Hx     Social History Social History   Tobacco Use   Smoking status: Former   Smokeless tobacco: Never  Building services engineer Use: Every day  Substance Use Topics   Alcohol use: No   Drug use: Not Currently    Types: IV, Cocaine    Comment: hx of heroin abuse (opioid addiction), crack    Review of Systems  Constitutional: No fever/chills Eyes: No visual changes. ENT: No sore throat. Cardiovascular: Positive for chest pain. Respiratory: Positive for shortness of breath. Gastrointestinal: No abdominal pain.  No nausea, no vomiting.  No diarrhea.  No constipation. Genitourinary: Negative for dysuria. Musculoskeletal: Negative for back pain. Skin: Negative for rash. Neurological: Negative for headaches, focal weakness or numbness. Psychiatric: Positive for anxiety.  ____________________________________________   PHYSICAL EXAM:  VITAL SIGNS: Vitals:   05/09/21 0747  BP: (!) 119/49  Pulse: 80  Resp: 17  Temp: 97.6 F (36.4 C)  SpO2: 99%    Constitutional: Alert and oriented. Well appearing and in no acute distress. Eyes: Conjunctivae are normal. PERRL. EOMI. Head: Atraumatic. Nose: No congestion/rhinnorhea. Mouth/Throat: Mucous membranes are moist.  Oropharynx non-erythematous. Neck: No stridor. No cervical spine tenderness to palpation. Cardiovascular: Normal rate, regular rhythm. Grossly normal heart sounds.  Good peripheral circulation. Respiratory: Normal respiratory effort.  No retractions. Lungs CTAB. Gastrointestinal: Soft , nondistended, nontender to palpation. No CVA  tenderness. Musculoskeletal: No lower extremity tenderness nor edema.  No joint effusions. No signs of acute trauma. Neurologic:  Normal speech and language. No gross focal neurologic deficits are appreciated. No gait instability noted. Skin:  Skin is warm, dry and intact. No rash noted. Psychiatric: Mood and affect are normal. Speech and behavior are normal. ____________________________________________   LABS (all labs ordered are listed, but only abnormal results are displayed)  Labs Reviewed  BASIC METABOLIC PANEL - Abnormal; Notable for the following components:      Result Value   Creatinine, Ser 1.27 (*)    All other components within normal limits  CBC  TROPONIN I (HIGH SENSITIVITY)   ____________________________________________  12 Lead EKG  Sinus rhythm, rate of 80 bpm.  Rightward axis, normal intervals and no evidence of acute ischemia. ____________________________________________  RADIOLOGY  ED MD interpretation: 2 view CXR reviewed by me without evidence of acute cardiopulmonary pathology.  Official radiology report(s): DG Chest 2 View  Result Date: 05/09/2021 CLINICAL DATA:  Chest pain and shortness of breath. EXAM: CHEST - 2  VIEW COMPARISON:  02/09/2021. FINDINGS: Trachea is midline. Heart size stable. Lungs are clear. No pleural fluid. IMPRESSION: No acute findings. Electronically Signed   By: Leanna Battles M.D.   On: 05/09/2021 08:31    ____________________________________________   PROCEDURES and INTERVENTIONS  Procedure(s) performed (including Critical Care):  .1-3 Lead EKG Interpretation  Date/Time: 05/09/2021 8:56 AM Performed by: Delton Prairie, MD Authorized by: Delton Prairie, MD     Interpretation: normal     ECG rate:  76   ECG rate assessment: normal     Rhythm: sinus rhythm     Ectopy: none     Conduction: normal    Medications - No data to display  ____________________________________________   MDM / ED COURSE   40 year old male  without cardiac history presents to the ED with chest pain and shortness of breath, without evidence of acute cardiopulmonary pathology, and amenable to outpatient management.  Normal vitals.  Exam reassuring without evidence of acute derangements.  He has a somewhat anxious affect, but has linear thought processes and no evidence of psychiatric emergency.  Lungs are clear without wheezing and he has resolved symptoms here in the ED.  Benign work-up without evidence of ACS, PTX or further acute derangements.  We will discharge with return precautions.  Clinical Course as of 05/09/21 0856  Wed May 09, 2021  1610 Shared decision-making yields plan to not pursue repeat troponin as patient is requesting outpatient management.  We discussed return precautions. [DS]    Clinical Course User Index [DS] Delton Prairie, MD    ____________________________________________   FINAL CLINICAL IMPRESSION(S) / ED DIAGNOSES  Final diagnoses:  Other chest pain  Shortness of breath  Anxiety  Current every day vaping     ED Discharge Orders     None        Kiet Geer Katrinka Blazing   Note:  This document was prepared using Dragon voice recognition software and may include unintentional dictation errors.    Delton Prairie, MD 05/09/21 [phone]

## 2021-05-09 NOTE — Addendum Note (Signed)
Addended by: Wendie Simmer B on: 05/09/2021 11:38 AM   Modules accepted: Orders

## 2021-05-09 NOTE — ED Triage Notes (Signed)
Formatting of this note might be different from the original.  Pt to ED via POV stating that he has been having chest pain and shortness of breath since about 0615 this morning. Pt reports that he has had similar events in the past. Pt states that he was told to stop vaping when he did the pain improved. Pt states that he recently started vaping again. Pt reports that he is also short of breath. Pt states that he took 800 mg of ibuprofen this morning for the pain and also took 5 mg of diazepam for anxiety. Pt is in NAD and is able to speak in complete sentences at this time.   Electronically signed by Wynona Neat, RN at 05/09/2021  7:43 AM EDT

## 2021-05-09 NOTE — ED Provider Notes (Signed)
Associated Order(s): .1-3 Lead EKG Interpretation  Formatting of this note is different from the original.  Images from the original note were not included.      [facility]  Emergency Department Provider Note  ____________________________________________     Event Date/Time    First MD Initiated Contact with Patient 05/09/21 [phone]      (approximate)    I have reviewed the triage vital signs and the nursing notes.    HISTORY    Chief Complaint  Chest Pain and Shortness of Breath    HPI  Lucas Hicks is a 40 y.o. malewho presents to the ED for evaluation of chest pain/SOB.     Chart review indicates hx polysubstance abuse.  Anxiety disorder on as needed diazepam.    Patient presents to the ED due to chest pain and shortness of breath just began this morning.  He reports feeling normal yesterday.  Patient reports awakening this morning at his normal time for work, with sharp chest pains and associated sensation of dyspnea.  He reports using his albuterol inhaler, which worsened his symptoms.  He then reports taking 800 mg of ibuprofen and 5 mg of diazepam, slowly improving symptoms.    By the time that I have seen the patient, 3-4 hours after his symptoms have started, he reports feeling better now and feels okay.  Denies any complaints.  Reports that he just wants to go home and get some rest as he is already called out from work today.    Patient further electively elaborates on his stressors at work with his job.  Reports feeling quite anxious due to his new job being rather demanding.    Denies any nausea, emesis, syncopal episodes or dizziness, recent illnesses.    Past Medical History:   Diagnosis Date    CHF (congestive heart failure) (HCC)     Exposure to hepatitis B     Exposure to hepatitis C     Hepatitis C     History of opioid abuse (HCC)     reports heroin abuse, ending around 2017    Hypertension     IV drug abuse (HCC)     Renal disorder     kidney stones     Patient Active Problem  List    Diagnosis Date Noted    Bronchospasm 02/18/2021    COVID-19 vaccine dose declined 12/20/2020    Hepatitis C test positive 12/20/2020    Prolactin increased 12/20/2020    Advance care planning 12/20/2020    GAD (generalized anxiety disorder) 04/04/2020    Attention deficit hyperactivity disorder, combined type 04/04/2020    Opioid type dependence, continuous (HCC) 04/04/2020    Major depressive disorder 07/14/2019    Alcohol abuse 07/14/2019    Cocaine abuse (HCC) 07/12/2019    NICM (nonischemic cardiomyopathy) (HCC) 11/28/2017    Renal stone 11/28/2017    History of seizure 03/14/2017    IVDU (intravenous drug user) 03/14/2017    Other specified abnormal immunological findings in serum 12/21/2015     Past Surgical History:   Procedure Laterality Date    APPENDECTOMY      EYE SURGERY      secondary to dog bite    TIBIA FRACTURE SURGERY Right      Prior to Admission medications    Medication Sig Start Date End Date Taking? Authorizing Provider   albuterol (VENTOLIN HFA) 108 (90 Base) MCG/ACT inhaler Inhale 1-2 puffs into the lungs every 6 (six) hours  as needed for wheezing or shortness of breath. 02/16/21   Joaquim Nam, MD   buPROPion (WELLBUTRIN XL) 300 MG 24 hr tablet Take 300 mg by mouth daily.    [provider]   diazepam (VALIUM) 5 MG tablet Take 5 mg by mouth 4 (four) times daily as needed. 12/14/20   [provider]   doxepin (SINEQUAN) 10 MG capsule Take 1 capsule (10 mg total) by mouth at bedtime as needed. 12/18/20   Joaquim Nam, MD   gabapentin (NEURONTIN) 300 MG capsule Take 2 capsules (600 mg total) by mouth 3 (three) times daily as needed. 12/18/20   Joaquim Nam, MD   hydrochlorothiazide (MICROZIDE) 12.5 MG capsule Take 1 capsule (12.5 mg total) by mouth daily. 12/27/20   Joaquim Nam, MD   ketorolac (TORADOL) 10 MG tablet Take 1 tablet (10 mg total) by mouth every 6 (six) hours as needed. 01/10/21   Joni Reining, PA-C   lamoTRIgine (LAMICTAL) 200 MG tablet Take  1 tablet (200 mg total) by mouth daily. 12/18/20 03/18/21  Joaquim Nam, MD   SUBLOCADE 100 MG/0.5ML SOSY Inject 100 mg into the skin every 28 (twenty-eight) days. 11/20/20   [provider]   pantoprazole (PROTONIX) 40 MG tablet Take 1 tablet (40 mg total) by mouth daily. 07/17/19 08/03/19  Clapacs, Jackquline Denmark, MD     Allergies  Amoxicillin and Prednisone    Family History   Problem Relation Age of Onset    Alcohol abuse Father     Melanoma Mother     Breast cancer Mother     Colon cancer Neg Hx     Prostate cancer Neg Hx      Social History  Social History     Tobacco Use    Smoking status: Former    Smokeless tobacco: Never   Haematologist Use: Every day   Substance Use Topics    Alcohol use: No    Drug use: Not Currently     Types: IV, Cocaine     Comment: hx of heroin abuse (opioid addiction), crack     Review of Systems    Constitutional: No fever/chills  Eyes: No visual changes.  ENT: No sore throat.  Cardiovascular: Positive for chest pain.  Respiratory: Positive for shortness of breath.  Gastrointestinal: No abdominal pain.  No nausea, no vomiting.  No diarrhea.  No constipation.  Genitourinary: Negative for dysuria.  Musculoskeletal: Negative for back pain.  Skin: Negative for rash.  Neurological: Negative for headaches, focal weakness or numbness.  Psychiatric: Positive for anxiety.    ____________________________________________    PHYSICAL EXAM:    VITAL SIGNS:  Vitals:    05/09/21 0747   BP: (!) 119/49   Pulse: 80   Resp: 17   Temp: 97.6 F (36.4 C)   SpO2: 99%     Constitutional: Alert and oriented. Well appearing and in no acute distress.  Eyes: Conjunctivae are normal. PERRL. EOMI.  Head: Atraumatic.  Nose: No congestion/rhinnorhea.  Mouth/Throat: Mucous membranes are moist.  Oropharynx non-erythematous.  Neck: No stridor. No cervical spine tenderness to palpation.  Cardiovascular: Normal rate, regular rhythm. Grossly normal heart sounds.  Good peripheral circulation.  Respiratory:  Normal respiratory effort.  No retractions. Lungs CTAB.  Gastrointestinal: Soft , nondistended, nontender to palpation. No CVA tenderness.  Musculoskeletal: No lower extremity tenderness nor edema.  No joint effusions. No signs of acute trauma.  Neurologic:  Normal speech  and language. No gross focal neurologic deficits are appreciated. No gait instability noted.  Skin:  Skin is warm, dry and intact. No rash noted.  Psychiatric: Mood and affect are normal. Speech and behavior are normal.  ____________________________________________    LABS  (all labs ordered are listed, but only abnormal results are displayed)    Labs Reviewed   BASIC METABOLIC PANEL - Abnormal; Notable for the following components:       Result Value    Creatinine, Ser 1.27 (*)     All other components within normal limits   CBC   TROPONIN I (HIGH SENSITIVITY)     ____________________________________________    12 Lead EKG    Sinus rhythm, rate of 80 bpm.  Rightward axis, normal intervals and no evidence of acute ischemia.  ____________________________________________    RADIOLOGY    ED MD interpretation: 2 view CXR reviewed by me without evidence of acute cardiopulmonary pathology.    Official radiology report(s):  DG Chest 2 View    Result Date: 05/09/2021  CLINICAL DATA:  Chest pain and shortness of breath. EXAM: CHEST - 2 VIEW COMPARISON:  02/09/2021. FINDINGS: Trachea is midline. Heart size stable. Lungs are clear. No pleural fluid. IMPRESSION: No acute findings. Electronically Signed   By: Leanna Battles M.D.   On: 05/09/2021 08:31      ____________________________________________    PROCEDURES and INTERVENTIONS    Procedure(s) performed (including Critical Care):    .1-3 Lead EKG Interpretation    Date/Time: 05/09/2021 8:56 AM  Performed by: Delton Prairie, MD  Authorized by: Delton Prairie, MD       Interpretation: normal      ECG rate:  76    ECG rate assessment: normal      Rhythm: sinus rhythm      Ectopy: none      Conduction: normal       Medications - No data to display    ____________________________________________    MDM / ED COURSE    40 year old male without cardiac history presents to the ED with chest pain and shortness of breath, without evidence of acute cardiopulmonary pathology, and amenable to outpatient management.  Normal vitals.  Exam reassuring without evidence of acute derangements.  He has a somewhat anxious affect, but has linear thought processes and no evidence of psychiatric emergency.  Lungs are clear without wheezing and he has resolved symptoms here in the ED.  Benign work-up without evidence of ACS, PTX or further acute derangements.  We will discharge with return precautions.    Clinical Course as of 05/09/21 0856   Wed May 09, 2021   1610 Shared decision-making yields plan to not pursue repeat troponin as patient is requesting outpatient management.  We discussed return precautions. [DS]     Clinical Course User Index  [DS] Delton Prairie, MD     ____________________________________________    FINAL CLINICAL IMPRESSION(S) / ED DIAGNOSES    Final diagnoses:   Other chest pain   Shortness of breath   Anxiety   Current every day vaping     ED Discharge Orders       None         Dylan Katrinka Blazing    Note:  This document was prepared using Dragon voice recognition software and may include unintentional dictation errors.      Delton Prairie, MD  05/09/21 [phone]    Electronically signed by Delton Prairie, MD at 05/09/2021  8:59 AM EDT

## 2021-05-31 ENCOUNTER — Other Ambulatory Visit: Payer: Self-pay | Admitting: Family Medicine

## 2021-06-01 NOTE — Telephone Encounter (Signed)
Sent. Thanks.  If needed frequently (ie mult times per week) then needs OV to see about options.  Thanks.

## 2021-06-01 NOTE — Telephone Encounter (Signed)
Refill request Albuterol Last refill 05/09/21   8G Last office visit 02/16/21

## 2021-06-20 ENCOUNTER — Ambulatory Visit: Payer: 59 | Admitting: Nurse Practitioner

## 2021-06-22 ENCOUNTER — Telehealth (INDEPENDENT_AMBULATORY_CARE_PROVIDER_SITE_OTHER): Payer: 59 | Admitting: Nurse Practitioner

## 2021-06-22 DIAGNOSIS — M545 Low back pain, unspecified: Secondary | ICD-10-CM | POA: Diagnosis not present

## 2021-06-22 MED ORDER — MELOXICAM 7.5 MG PO TABS
7.5000 mg | ORAL_TABLET | Freq: Two times a day (BID) | ORAL | 0 refills | Status: AC | PRN
Start: 2021-06-22 — End: 2021-07-07

## 2021-06-22 NOTE — Assessment & Plan Note (Addendum)
Patient states that approx 3-4 weeks ago he was lifting some scaffolding and twisted.  He has been having back pain ever since.  Has not increased nor has it gotten better.  Patient was unable to be seen in office due to work schedule versus office hours.  Did tell patient without examining him hard to make recommendations.  We will offer patient meloxicam 7-1/2 mg twice daily for a few days to see if that is beneficial.  If this does not help patient will need to be seen in office either by me or another clinician.  Also told him he could go to an urgent care since they have extended hours. Patient did request a muscle relaxant.  Patient already on diazepam and Sublocade.  Along with gabapentin, will defer at this time stick with meloxicam

## 2021-06-22 NOTE — Progress Notes (Addendum)
Acute Office Visit  Subjective:    Patient ID: Randall Parsons, male    DOB: 1981/03/16, 40 y.o.   MRN: [id number]  Chief Complaint  Patient presents with   Back Pain    Started about 3 to 4 weeks ago. He was lifting some scaffling (very heavy metal pieces) and was putting them  on the shelf and twisted his body at the same time and has not been doing well since then. Located in the lower mid back and radiates up his back, and radiates to the right and left side of his back with twisting his body. Pain described as achy, dull, pulled muscle feeling-tightness sensation, like a pinched nerve per patient. When patient does bending or walking has more intense pain.    I connected with patient through [facility]. After several attempts of not being able to communicate I switched to telephone visit. Patient identifiers were used. Patient permission was obtained verbally  Video call consisted of me and the patient Provider location was: office Patient location was: work site ([place])  Patient is in today for back pain  States 3-4 weeks ago he was lifting scaffolding on some shelves. States that he lifted and twisted. Felt a sharp pain. States it feels like a pulled muscle and pinched. States he wears back brace and sees chiropractor. Constant aching, with electric feeling. States it goes up his back.  Past Medical History:  Diagnosis Date   CHF (congestive heart failure) (HCC)    Exposure to hepatitis B    Exposure to hepatitis C    Hepatitis C    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, ending around 2017   Hypertension    IV drug abuse (HCC)    Renal disorder    kidney stones    Past Surgical History:  Procedure Laterality Date   APPENDECTOMY     EYE SURGERY     secondary to dog bite   TIBIA FRACTURE SURGERY Right     Family History  Problem Relation Age of Onset   Alcohol abuse Father    Melanoma Mother    Breast cancer Mother    Colon cancer Neg Hx    Prostate  cancer Neg Hx     Social History   Socioeconomic History   Marital status: Married    Spouse name: Not on file   Number of children: Not on file   Years of education: Not on file   Highest education level: Not on file  Occupational History   Not on file  Tobacco Use   Smoking status: Former   Smokeless tobacco: Never  Vaping Use   Vaping Use: Every day  Substance and Sexual Activity   Alcohol use: No   Drug use: Not Currently    Types: IV, Cocaine    Comment: hx of heroin abuse (opioid addiction), crack   Sexual activity: Not on file  Other Topics Concern   Not on file  Social History Narrative   Working at [employer] in [place].  Married, has 5 step kids (2 at home).  From [facility].  Estate agent.    Social Determinants of Health   Financial Resource Strain: Not on file  Food Insecurity: Not on file  Transportation Needs: Not on file  Physical Activity: Not on file  Stress: Not on file  Social Connections: Not on file  Intimate Partner Violence: Not on file    Outpatient Medications Prior to Visit  Medication Sig Dispense  Refill   albuterol (VENTOLIN HFA) 108 (90 Base) MCG/ACT inhaler INHALE 1-2 PUFFS BY MOUTH EVERY 6 HOURS AS NEEDED FOR WHEEZE OR SHORTNESS OF BREATH 8.5 each 1   buPROPion (WELLBUTRIN XL) 300 MG 24 hr tablet Take 300 mg by mouth daily.     diazepam (VALIUM) 5 MG tablet Take 5 mg by mouth 4 (four) times daily as needed.     doxepin (SINEQUAN) 10 MG capsule Take 1 capsule (10 mg total) by mouth at bedtime as needed.     gabapentin (NEURONTIN) 300 MG capsule Take 2 capsules (600 mg total) by mouth 3 (three) times daily as needed.     SUBLOCADE 100 MG/0.5ML SOSY Inject 100 mg into the skin every 28 (twenty-eight) days.     lamoTRIgine (LAMICTAL) 200 MG tablet Take 1 tablet (200 mg total) by mouth daily.     hydrochlorothiazide (MICROZIDE) 12.5 MG capsule Take 1 capsule (12.5 mg total) by mouth daily. 90 capsule 1   ketorolac (TORADOL) 10 MG tablet Take  1 tablet (10 mg total) by mouth every 6 (six) hours as needed. 20 tablet 0   No facility-administered medications prior to visit.    Allergies  Allergen Reactions   Amoxicillin Hives    Did it involve swelling of the face/tongue/throat, SOB, or low BP? No Did it involve sudden or severe rash/hives, skin peeling, or any reaction on the inside of your mouth or nose? Yes Did you need to seek medical attention at a hospital or doctor's office? Yes When did it last happen?     40 yrs old If all above answers are "NO", may proceed with cephalosporin use.    Prednisone     irritable    Review of Systems  Constitutional:  Negative for chills and fever.  Respiratory:  Negative for shortness of breath.   Cardiovascular:  Negative for chest pain.  Musculoskeletal:  Positive for back pain.  Neurological:  Negative for weakness and numbness.      Objective:    Physical Exam Nursing note reviewed.  Neurological:     Mental Status: He is alert.  Psychiatric:        Mood and Affect: Mood normal.        Thought Content: Thought content normal.        Judgment: Judgment normal.    There were no vitals taken for this visit. Wt Readings from Last 3 Encounters:  05/09/21 185 lb (83.9 kg)  02/16/21 182 lb 9.6 oz (82.8 kg)  02/09/21 198 lb 6.6 oz (90 kg)    Health Maintenance Due  Topic Date Due   COVID-19 Vaccine (1) Never done   Hepatitis C Screening  Never done   INFLUENZA VACCINE  Never done    There are no preventive care reminders to display for this patient.   Lab Results  Component Value Date   TSH 2.277 12/03/2020   Lab Results  Component Value Date   WBC 4.7 05/09/2021   HGB 14.2 05/09/2021   HCT 42.6 05/09/2021   MCV 91.8 05/09/2021   PLT 267 05/09/2021   Lab Results  Component Value Date   NA 138 05/09/2021   K 4.1 05/09/2021   CO2 28 05/09/2021   GLUCOSE 98 05/09/2021   BUN 17 05/09/2021   CREATININE 1.27 (H) 05/09/2021   BILITOT 0.7 12/03/2020    ALKPHOS 92 12/03/2020   AST 46 (H) 12/03/2020   ALT 34 12/03/2020   PROT 7.4 12/03/2020   ALBUMIN 4.2  12/03/2020   CALCIUM 9.4 05/09/2021   ANIONGAP 8 05/09/2021   GFR 82.22 01/04/2021   Lab Results  Component Value Date   CHOL 158 12/03/2020   Lab Results  Component Value Date   HDL 44 12/03/2020   Lab Results  Component Value Date   LDLCALC 104 (H) 12/03/2020   Lab Results  Component Value Date   TRIG 49 12/03/2020   Lab Results  Component Value Date   CHOLHDL 3.6 12/03/2020   Lab Results  Component Value Date   HGBA1C 5.8 (H) 12/03/2020       Assessment & Plan:   Problem List Items Addressed This Visit       Other   Low back pain - Primary    Patient states that approx 3-4 weeks ago he was lifting some scaffolding and twisted.  He has been having back pain ever since.  Has not increased nor has it gotten better.  Patient was unable to be seen in office due to work schedule versus office hours.  Did tell patient without examining him hard to make recommendations.  We will offer patient meloxicam 7-1/2 mg twice daily for a few days to see if that is beneficial.  If this does not help patient will need to be seen in office either by me or another clinician.  Also told him he could go to an urgent care since they have extended hours. Patient did request a muscle relaxant.  Patient already on diazepam and Sublocade.  Along with gabapentin, will defer at this time stick with meloxicam      Relevant Medications   meloxicam (MOBIC) 7.5 MG tablet   Phone call lasted 15 minutes and 5 seconds   No orders of the defined types were placed in this encounter.  This visit occurred during the SARS-CoV-2 public health emergency.  Safety protocols were in place, including screening questions prior to the visit, additional usage of staff PPE, and extensive cleaning of exam room while observing appropriate contact time as indicated for disinfecting solutions.   Audria Nine,  NP

## 2021-07-02 ENCOUNTER — Other Ambulatory Visit: Payer: Self-pay

## 2021-07-02 ENCOUNTER — Emergency Department: Payer: 59

## 2021-07-02 ENCOUNTER — Encounter: Payer: Self-pay | Admitting: Emergency Medicine

## 2021-07-02 ENCOUNTER — Emergency Department
Admission: EM | Admit: 2021-07-02 | Discharge: 2021-07-02 | Disposition: A | Discharge status: Home / Self Care | Payer: 59 | Attending: Emergency Medicine | Admitting: Emergency Medicine

## 2021-07-02 DIAGNOSIS — M791 Myalgia, unspecified site: Secondary | ICD-10-CM | POA: Insufficient documentation

## 2021-07-02 DIAGNOSIS — I110 Hypertensive heart disease with heart failure: Secondary | ICD-10-CM | POA: Diagnosis not present

## 2021-07-02 DIAGNOSIS — Z20822 Contact with and (suspected) exposure to covid-19: Secondary | ICD-10-CM | POA: Diagnosis not present

## 2021-07-02 DIAGNOSIS — I509 Heart failure, unspecified: Secondary | ICD-10-CM | POA: Diagnosis not present

## 2021-07-02 DIAGNOSIS — R519 Headache, unspecified: Secondary | ICD-10-CM | POA: Diagnosis not present

## 2021-07-02 DIAGNOSIS — Z87891 Personal history of nicotine dependence: Secondary | ICD-10-CM | POA: Insufficient documentation

## 2021-07-02 DIAGNOSIS — R52 Pain, unspecified: Secondary | ICD-10-CM

## 2021-07-02 LAB — RESP PANEL BY RT-PCR (FLU A&B, COVID) ARPGX2
Influenza A by PCR: NEGATIVE
Influenza B by PCR: NEGATIVE
SARS Coronavirus 2 by RT PCR: NEGATIVE

## 2021-07-02 NOTE — Discharge Instructions (Addendum)
Follow-up with your regular doctor as needed.  Return emergency department if worsening.  Stop taking the herbal medication.  Drink plenty of water.   Your chest x-ray is normal per the radiologist

## 2021-07-02 NOTE — ED Triage Notes (Signed)
Pt reports that he has had sinus pressure that turns into a migraine for the last several days, he was seen at the Urgent Care yesterday and given medication for this. He tested his self for COVID this am and it was negative. He feels achy all over, no fever. Thinks he may have the flu

## 2021-07-02 NOTE — ED Notes (Signed)
See triage note  presents with sinus pressure and congestion  afebrile on arrival

## 2021-07-02 NOTE — ED Provider Notes (Signed)
[facility] Emergency Department Provider Note  ____________________________________________   Event Date/Time   First MD Initiated Contact with Patient 07/02/21 [phone]     (approximate)  I have reviewed the triage vital signs and the nursing notes.   HISTORY  Chief Complaint Facial Pain    HPI Randall Parsons is a 40 y.o. male presents emergency department complaining of sinus pressure that turned into a migraine for over the last several days.  He was seen in urgent care and given migraine medication.  Had a negative home COVID test.  States he feels achy all over.  Has not had a fever.  No cough or congestion.  Some chest pressure but it cleared with his albuterol inhaler.  Patient also recently started taking a herbal medication called Nubrain that has St. John's wort, vitamin B, soy and other herbs.  Past Medical History:  Diagnosis Date   CHF (congestive heart failure) (HCC)    Exposure to hepatitis B    Exposure to hepatitis C    Hepatitis C    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, ending around 2017   Hypertension    IV drug abuse (HCC)    Renal disorder    kidney stones    Patient Active Problem List   Diagnosis Date Noted   Bronchospasm 02/18/2021   COVID-19 vaccine dose declined 12/20/2020   Hepatitis C test positive 12/20/2020   Prolactin increased 12/20/2020   Advance care planning 12/20/2020   GAD (generalized anxiety disorder) 04/04/2020   Attention deficit hyperactivity disorder, combined type 04/04/2020   Opioid type dependence, continuous (HCC) 04/04/2020   Major depressive disorder 07/14/2019   Alcohol abuse 07/14/2019   Cocaine abuse (HCC) 07/12/2019   Low back pain 11/28/2017   NICM (nonischemic cardiomyopathy) (HCC) 11/28/2017   Renal stone 11/28/2017   History of seizure 03/14/2017   IVDU (intravenous drug user) 03/14/2017   Other specified abnormal immunological findings in serum 12/21/2015    Past  Surgical History:  Procedure Laterality Date   APPENDECTOMY     EYE SURGERY     secondary to dog bite   TIBIA FRACTURE SURGERY Right     Prior to Admission medications   Medication Sig Start Date End Date Taking? Authorizing Provider  albuterol (VENTOLIN HFA) 108 (90 Base) MCG/ACT inhaler INHALE 1-2 PUFFS BY MOUTH EVERY 6 HOURS AS NEEDED FOR WHEEZE OR SHORTNESS OF BREATH 06/01/21   Joaquim Nam, MD  buPROPion (WELLBUTRIN XL) 300 MG 24 hr tablet Take 300 mg by mouth daily.    [provider]  diazepam (VALIUM) 5 MG tablet Take 5 mg by mouth 4 (four) times daily as needed. 12/14/20   [provider]  doxepin (SINEQUAN) 10 MG capsule Take 1 capsule (10 mg total) by mouth at bedtime as needed. 12/18/20   Joaquim Nam, MD  gabapentin (NEURONTIN) 300 MG capsule Take 2 capsules (600 mg total) by mouth 3 (three) times daily as needed. 12/18/20   Joaquim Nam, MD  lamoTRIgine (LAMICTAL) 200 MG tablet Take 1 tablet (200 mg total) by mouth daily. 12/18/20 03/18/21  Joaquim Nam, MD  meloxicam (MOBIC) 7.5 MG tablet Take 1 tablet (7.5 mg total) by mouth 2 (two) times daily as needed for up to 15 days for pain (as needed for pain or discomfort). Do NOT take with any other NSAIDs such as naproxen, aleve, motrin, ibuprofen, napersyn, or aspirin 06/22/21 07/07/21  Eden Emms, NP  SUBLOCADE 100 MG/0.5ML SOSY Inject 100 mg into the skin every 28 (twenty-eight) days. 11/20/20   [provider]  pantoprazole (PROTONIX) 40 MG tablet Take 1 tablet (40 mg total) by mouth daily. 07/17/19 08/03/19  Clapacs, Jackquline Denmark, MD    Allergies Amoxicillin and Prednisone  Family History  Problem Relation Age of Onset   Alcohol abuse Father    Melanoma Mother    Breast cancer Mother    Colon cancer Neg Hx    Prostate cancer Neg Hx     Social History Social History   Tobacco Use   Smoking status: Former   Smokeless tobacco: Never  Building services engineer Use: Every day  Substance Use  Topics   Alcohol use: No   Drug use: Not Currently    Types: IV, Cocaine    Comment: hx of heroin abuse (opioid addiction), crack    Review of Systems  Constitutional: No fever/chills Eyes: No visual changes. ENT: No sore throat. Respiratory: Denies cough Cardiovascular: Denies chest pain Gastrointestinal: Denies abdominal pain Genitourinary: Negative for dysuria. Musculoskeletal: Negative for back pain. Skin: Negative for rash. Psychiatric: no mood changes,     ____________________________________________   PHYSICAL EXAM:  VITAL SIGNS: ED Triage Vitals [07/02/21 0740]  Enc Vitals Group     BP 129/83     Pulse Rate 70     Resp 20     Temp 97.8 F (36.6 C)     Temp Source Oral     SpO2 99 %     Weight 195 lb (88.5 kg)     Height 5\' 9"  (1.753 m)     Head Circumference      Peak Flow      Pain Score 5     Pain Loc      Pain Edu?      Excl. in GC?     Constitutional: Alert and oriented. Well appearing and in no acute distress. Eyes: Conjunctivae are normal.  Head: Atraumatic. Nose: No congestion/rhinnorhea. Mouth/Throat: Mucous membranes are moist.   Neck:  supple no lymphadenopathy noted Cardiovascular: Normal rate, regular rhythm. Heart sounds are normal Respiratory: Normal respiratory effort.  No retractions, lungs c t a  Abd: soft nontender bs normal all 4 quad GU: deferred Musculoskeletal: FROM all extremities, warm and well perfused Neurologic:  Normal speech and language.  Skin:  Skin is warm, dry and intact. No rash noted. Psychiatric: Mood and affect are normal. Speech and behavior are normal.  ____________________________________________   LABS (all labs ordered are listed, but only abnormal results are displayed)  Labs Reviewed  RESP PANEL BY RT-PCR (FLU A&B, COVID) [id number]   ____________________________________________   ____________________________________________  RADIOLOGY  Chest  x-ray  ____________________________________________   PROCEDURES  Procedure(s) performed: No  Procedures    ____________________________________________   INITIAL IMPRESSION / ASSESSMENT AND PLAN / ED COURSE  Pertinent labs & imaging results that were available during my care of the patient were reviewed by me and considered in my medical decision making (see chart for details).   The patient is a 40 year old male presents with headache, facial pressure and body aches.  See HPI.  Physical exam shows patient appears stable.  Labs are reassuring, COVID/flu test is negative.  Chest x-ray reviewed by me confirmed by radiology to be normal.  After I looked up the herbal medication I do feel this is reacting with his Lamictal and doxepin.  Explained to him that he can end up having something called  serotonin syndrome.  He is to stop the new herbal medication.  States he has been on this medication for 3 weeks.  Encouraged him to drink plenty of water to help clear his system.  Remain on his regular medications.  Return emergency department worsening.  Discharged in stable condition.  Patient was given a printout of serotonin syndrome as we do not have it in epic.     Randall Parsons was evaluated in Emergency Department on 07/02/2021 for the symptoms described in the history of present illness. He was evaluated in the context of the global COVID-19 pandemic, which necessitated consideration that the patient might be at risk for infection with the SARS-CoV-2 virus that causes COVID-19. Institutional protocols and algorithms that pertain to the evaluation of patients at risk for COVID-19 are in a state of rapid change based on information released by regulatory bodies including the CDC and federal and state organizations. These policies and algorithms were followed during the patient's care in the ED.    As part of my medical decision making, I reviewed the following data within the electronic  MEDICAL RECORD NUMBER Nursing notes reviewed and incorporated, Labs reviewed , Old chart reviewed, Radiograph reviewed , Notes from prior ED visits, and [place] Controlled Substance Database  ____________________________________________   FINAL CLINICAL IMPRESSION(S) / ED DIAGNOSES  Final diagnoses:  Generalized body aches      NEW MEDICATIONS STARTED DURING THIS VISIT:  New Prescriptions   No medications on file     Note:  This document was prepared using Dragon voice recognition software and may include unintentional dictation errors.    Faythe Ghee, PA-C 07/02/21 1013    Dionne Bucy, MD 07/02/21 [id number]

## 2021-07-02 NOTE — ED Provider Notes (Signed)
Formatting of this note is different from the original.  Images from the original note were not included.      [facility]  Emergency Department Provider Note    ____________________________________________     Event Date/Time    First MD Initiated Contact with Patient 07/02/21 [phone]      (approximate)    I have reviewed the triage vital signs and the nursing notes.    HISTORY    Chief Complaint  Facial Pain    HPI  Lucas Hicks is a 40 y.o. male presents emergency department complaining of sinus pressure that turned into a migraine for over the last several days.  He was seen in urgent care and given migraine medication.  Had a negative home COVID test.  States he feels achy all over.  Has not had a fever.  No cough or congestion.  Some chest pressure but it cleared with his albuterol inhaler.  Patient also recently started taking a herbal medication called Nubrain that has St. John's wort, vitamin B, soy and other herbs.    Past Medical History:   Diagnosis Date    CHF (congestive heart failure) (HCC)     Exposure to hepatitis B     Exposure to hepatitis C     Hepatitis C     History of opioid abuse (HCC)     reports heroin abuse, ending around 2017    Hypertension     IV drug abuse (HCC)     Renal disorder     kidney stones     Patient Active Problem List    Diagnosis Date Noted    Bronchospasm 02/18/2021    COVID-19 vaccine dose declined 12/20/2020    Hepatitis C test positive 12/20/2020    Prolactin increased 12/20/2020    Advance care planning 12/20/2020    GAD (generalized anxiety disorder) 04/04/2020    Attention deficit hyperactivity disorder, combined type 04/04/2020    Opioid type dependence, continuous (HCC) 04/04/2020    Major depressive disorder 07/14/2019    Alcohol abuse 07/14/2019    Cocaine abuse (HCC) 07/12/2019    Low back pain 11/28/2017    NICM (nonischemic cardiomyopathy) (HCC) 11/28/2017    Renal stone 11/28/2017    History of seizure 03/14/2017    IVDU (intravenous drug  user) 03/14/2017    Other specified abnormal immunological findings in serum 12/21/2015     Past Surgical History:   Procedure Laterality Date    APPENDECTOMY      EYE SURGERY      secondary to dog bite    TIBIA FRACTURE SURGERY Right      Prior to Admission medications    Medication Sig Start Date End Date Taking? Authorizing Provider   albuterol (VENTOLIN HFA) 108 (90 Base) MCG/ACT inhaler INHALE 1-2 PUFFS BY MOUTH EVERY 6 HOURS AS NEEDED FOR WHEEZE OR SHORTNESS OF BREATH 06/01/21   Joaquim Nam, MD   buPROPion (WELLBUTRIN XL) 300 MG 24 hr tablet Take 300 mg by mouth daily.    [provider]   diazepam (VALIUM) 5 MG tablet Take 5 mg by mouth 4 (four) times daily as needed. 12/14/20   [provider]   doxepin (SINEQUAN) 10 MG capsule Take 1 capsule (10 mg total) by mouth at bedtime as needed. 12/18/20   Joaquim Nam, MD   gabapentin (NEURONTIN) 300 MG capsule Take 2 capsules (600 mg total) by mouth 3 (three) times daily as needed. 12/18/20   Para March,  Dwana Curd, MD   lamoTRIgine (LAMICTAL) 200 MG tablet Take 1 tablet (200 mg total) by mouth daily. 12/18/20 03/18/21  Joaquim Nam, MD   meloxicam (MOBIC) 7.5 MG tablet Take 1 tablet (7.5 mg total) by mouth 2 (two) times daily as needed for up to 15 days for pain (as needed for pain or discomfort). Do NOT take with any other NSAIDs such as naproxen, aleve, motrin, ibuprofen, napersyn, or aspirin 06/22/21 07/07/21  Eden Emms, NP   SUBLOCADE 100 MG/0.5ML SOSY Inject 100 mg into the skin every 28 (twenty-eight) days. 11/20/20   [provider]   pantoprazole (PROTONIX) 40 MG tablet Take 1 tablet (40 mg total) by mouth daily. 07/17/19 08/03/19  Clapacs, Jackquline Denmark, MD     Allergies  Amoxicillin and Prednisone    Family History   Problem Relation Age of Onset    Alcohol abuse Father     Melanoma Mother     Breast cancer Mother     Colon cancer Neg Hx     Prostate cancer Neg Hx      Social History  Social History     Tobacco Use    Smoking  status: Former    Smokeless tobacco: Never   Haematologist Use: Every day   Substance Use Topics    Alcohol use: No    Drug use: Not Currently     Types: IV, Cocaine     Comment: hx of heroin abuse (opioid addiction), crack     Review of Systems    Constitutional: No fever/chills  Eyes: No visual changes.  ENT: No sore throat.  Respiratory: Denies cough  Cardiovascular: Denies chest pain  Gastrointestinal: Denies abdominal pain  Genitourinary: Negative for dysuria.  Musculoskeletal: Negative for back pain.  Skin: Negative for rash.  Psychiatric: no mood changes,     ____________________________________________    PHYSICAL EXAM:    VITAL SIGNS:  ED Triage Vitals [07/02/21 0740]   Enc Vitals Group      BP 129/83      Pulse Rate 70      Resp 20      Temp 97.8 F (36.6 C)      Temp Source Oral      SpO2 99 %      Weight 195 lb (88.5 kg)      Height 5\' 9"  (1.753 m)      Head Circumference       Peak Flow       Pain Score 5      Pain Loc       Pain Edu?       Excl. in GC?      Constitutional: Alert and oriented. Well appearing and in no acute distress.  Eyes: Conjunctivae are normal.   Head: Atraumatic.  Nose: No congestion/rhinnorhea.  Mouth/Throat: Mucous membranes are moist.    Neck:  supple no lymphadenopathy noted  Cardiovascular: Normal rate, regular rhythm. Heart sounds are normal  Respiratory: Normal respiratory effort.  No retractions, lungs c t a   Abd: soft nontender bs normal all 4 quad  GU: deferred  Musculoskeletal: FROM all extremities, warm and well perfused  Neurologic:  Normal speech and language.   Skin:  Skin is warm, dry and intact. No rash noted.  Psychiatric: Mood and affect are normal. Speech and behavior are normal.    ____________________________________________    LABS  (all labs ordered are listed, but only abnormal results are  displayed)    Labs Reviewed   RESP PANEL BY RT-PCR (FLU A&B, COVID) [id number]      ____________________________________________    ____________________________________________    RADIOLOGY    Chest x-ray    ____________________________________________    PROCEDURES    Procedure(s) performed: No    Procedures    ____________________________________________    INITIAL IMPRESSION / ASSESSMENT AND PLAN / ED COURSE    Pertinent labs & imaging results that were available during my care of the patient were reviewed by me and considered in my medical decision making (see chart for details).    The patient is a 40 year old male presents with headache, facial pressure and body aches.  See HPI.  Physical exam shows patient appears stable.  Labs are reassuring, COVID/flu test is negative.  Chest x-ray reviewed by me confirmed by radiology to be normal.    After I looked up the herbal medication I do feel this is reacting with his Lamictal and doxepin.  Explained to him that he can end up having something called serotonin syndrome.  He is to stop the new herbal medication.  States he has been on this medication for 3 weeks.  Encouraged him to drink plenty of water to help clear his system.  Remain on his regular medications.  Return emergency department worsening.  Discharged in stable condition.  Patient was given a printout of serotonin syndrome as we do not have it in epic.        Lucas Hicks was evaluated in Emergency Department on 07/02/2021 for the symptoms described in the history of present illness. He was evaluated in the context of the global COVID-19 pandemic, which necessitated consideration that the patient might be at risk for infection with the SARS-CoV-2 virus that causes COVID-19. Institutional protocols and algorithms that pertain to the evaluation of patients at risk for COVID-19 are in a state of rapid change based on information released by regulatory bodies including the CDC and federal and state organizations. These policies and algorithms were followed during the patient's care in  the ED.     As part of my medical decision making, I reviewed the following data within the electronic MEDICAL RECORD NUMBER Nursing notes reviewed and incorporated, Labs reviewed , Old chart reviewed, Radiograph reviewed , Notes from prior ED visits, and [place] Controlled Substance Database    ____________________________________________    FINAL CLINICAL IMPRESSION(S) / ED DIAGNOSES    Final diagnoses:   Generalized body aches     NEW MEDICATIONS STARTED DURING THIS VISIT:    New Prescriptions    No medications on file     Note:  This document was prepared using Dragon voice recognition software and may include unintentional dictation errors.      Faythe Ghee, PA-C  07/02/21 1013      Dionne Bucy, MD  07/02/21 1518    Electronically signed by Dionne Bucy, MD at 07/02/2021  3:18 PM EDT    Associated attestation - Dionne Bucy, MD - 07/02/2021  3:18 PM EDT  Formatting of this note might be different from the original.  Attestation: Medical screening examination/treatment/procedure(s) were performed by non-physician practitioner and as supervising physician I was immediately available for consultation/collaboration.

## 2021-07-02 NOTE — ED Triage Notes (Signed)
Formatting of this note might be different from the original.  Pt reports that he has had sinus pressure that turns into a migraine for the last several days, he was seen at the Urgent Care yesterday and given medication for this. He tested his self for COVID this am and it was negative. He feels achy all over, no fever. Thinks he may have the flu  Electronically signed by Laren Boom, RN at 07/02/2021  7:39 AM EDT

## 2021-07-02 NOTE — ED Notes (Signed)
Formatting of this note might be different from the original.  See triage note  presents with sinus pressure and congestion  afebrile on arrival  Electronically signed by Lucas Mallow, RN at 07/02/2021  8:10 AM EDT

## 2021-07-31 ENCOUNTER — Other Ambulatory Visit: Payer: Self-pay | Admitting: Family Medicine

## 2021-08-03 ENCOUNTER — Other Ambulatory Visit: Payer: Self-pay

## 2021-08-03 ENCOUNTER — Encounter: Payer: Self-pay | Admitting: Family Medicine

## 2021-08-03 ENCOUNTER — Ambulatory Visit (INDEPENDENT_AMBULATORY_CARE_PROVIDER_SITE_OTHER): Payer: 59 | Admitting: Family Medicine

## 2021-08-03 VITALS — BP 108/72 | HR 75 | Temp 98.4°F | Ht 71.0 in | Wt 198.0 lb

## 2021-08-03 DIAGNOSIS — M25532 Pain in left wrist: Secondary | ICD-10-CM | POA: Diagnosis not present

## 2021-08-03 MED ORDER — IBUPROFEN 800 MG PO TABS: 800.0000 mg | ORAL_TABLET | Freq: Three times a day (TID) | ORAL | 0 refills | Status: DC | PRN

## 2021-08-03 NOTE — Progress Notes (Signed)
Patient ID: Randall Parsons, male    DOB: [birth year], 40 y.o.   MRN: [id number]  This visit was conducted in person.  BP 108/72   Pulse 75   Temp 98.4 F (36.9 C) (Temporal)   Ht 5\' 11"  (1.803 m)   Wt 198 lb (89.8 kg)   SpO2 96%   BMI 27.62 kg/m    CC: Chief Complaint  Patient presents with   Hand Pain   Numbness   Tingling    Subjective:   HPI: Randall Parsons is a 40 y.o. male presenting on 08/03/2021 for Hand Pain, Numbness, and Tingling   He reports new onset pain in left hand  in last years.. tingling in 1st,2nd,3rd digits .  Symptoms resolved wear brace for carpal tunnel...  He works with hands at work.. electrician.   In last few weeks symptoms have returned in last 3 months.   Has sudden onset sharp pain in  central left hand. Has to shake hand. Occurs while  working   Tingling and numbness at night occurring.  No grip change, no weakness.     Has been wearing brace at night for last 2 weeks.. has not improved symptoms.   Hx of narcotic abuse.  Has gabapentin to use prn. Valium as needed.   Relevant past medical, surgical, family and social history reviewed and updated as indicated. Interim medical history since our last visit reviewed. Allergies and medications reviewed and updated. Outpatient Medications Prior to Visit  Medication Sig Dispense Refill   albuterol (VENTOLIN HFA) 108 (90 Base) MCG/ACT inhaler INHALE 1-2 PUFFS BY MOUTH EVERY 6 HOURS AS NEEDED FOR WHEEZE OR SHORTNESS OF BREATH 8.5 each 1   buPROPion (WELLBUTRIN XL) 300 MG 24 hr tablet Take 300 mg by mouth daily.     diazepam (VALIUM) 5 MG tablet Take 5 mg by mouth 4 (four) times daily as needed.     doxepin (SINEQUAN) 10 MG capsule Take 1 capsule (10 mg total) by mouth at bedtime as needed.     gabapentin (NEURONTIN) 300 MG capsule Take 2 capsules (600 mg total) by mouth 3 (three) times daily as needed.     SUBLOCADE 100 MG/0.5ML SOSY Inject 100 mg into the skin every 28 (twenty-eight) days.      lamoTRIgine (LAMICTAL) 200 MG tablet Take 1 tablet (200 mg total) by mouth daily.     No facility-administered medications prior to visit.     Per HPI unless specifically indicated in ROS section below Review of Systems  Constitutional:  Negative for fatigue and fever.  HENT:  Negative for ear pain.   Eyes:  Negative for pain.  Respiratory:  Negative for cough and shortness of breath.   Cardiovascular:  Negative for chest pain, palpitations and leg swelling.  Gastrointestinal:  Negative for abdominal pain.  Genitourinary:  Negative for dysuria.  Musculoskeletal:  Negative for arthralgias.  Neurological:  Negative for syncope, light-headedness and headaches.  Psychiatric/Behavioral:  Negative for dysphoric mood.   Objective:  BP 108/72   Pulse 75   Temp 98.4 F (36.9 C) (Temporal)   Ht 5\' 11"  (1.803 m)   Wt 198 lb (89.8 kg)   SpO2 96%   BMI 27.62 kg/m   Wt Readings from Last 3 Encounters:  08/03/21 198 lb (89.8 kg)  07/02/21 195 lb (88.5 kg)  05/09/21 185 lb (83.9 kg)      Physical Exam Constitutional:      Appearance: He is well-developed.  HENT:  Head: Normocephalic.     Right Ear: Hearing normal.     Left Ear: Hearing normal.     Nose: Nose normal.  Neck:     Thyroid: No thyroid mass or thyromegaly.     Vascular: No carotid bruit.     Trachea: Trachea normal.  Cardiovascular:     Rate and Rhythm: Normal rate and regular rhythm.     Pulses: Normal pulses.     Heart sounds: Heart sounds not distant. No murmur heard.   No friction rub. No gallop.     Comments: No peripheral edema Pulmonary:     Effort: Pulmonary effort is normal. No respiratory distress.     Breath sounds: Normal breath sounds.  Musculoskeletal:     Left wrist: Tenderness present. Decreased range of motion. Normal pulse.     Comments: Positive tinel and phalen on left  Skin:    General: Skin is warm and dry.     Findings: No rash.  Psychiatric:        Speech: Speech normal.         Behavior: Behavior normal.        Thought Content: Thought content normal.      Results for orders placed or performed during the hospital encounter of 07/02/21  Resp Panel by RT-PCR (Flu A&B, Covid) Nasopharyngeal Swab   Specimen: Nasopharyngeal Swab; Nasopharyngeal(NP) swabs in vial transport medium  Result Value Ref Range   SARS Coronavirus 2 by RT PCR NEGATIVE NEGATIVE   Influenza A by PCR NEGATIVE NEGATIVE   Influenza B by PCR NEGATIVE NEGATIVE    This visit occurred during the SARS-CoV-2 public health emergency.  Safety protocols were in place, including screening questions prior to the visit, additional usage of staff PPE, and extensive cleaning of exam room while observing appropriate contact time as indicated for disinfecting solutions.   COVID 19 screen:  No recent travel or known exposure to COVID19 The patient denies respiratory symptoms of COVID 19 at this time. The importance of social distancing was discussed today.   Assessment and Plan  Problem List Items Addressed This Visit     Left wrist pain - Primary    Can try increasing gabapentin to 300 mg 1-2 a night.   Ibuprofen 800 mg three times  daily prn for pain. Wear the brace at night every night.  Wearing brace on wrist  for carpal tunnel.          Randall Nora, MD

## 2021-08-03 NOTE — Patient Instructions (Addendum)
Can try increasing gabapentin to 300 mg 1-2 a night.   Ibuprofen 800 mg three times  daily prn for pain. Wear the brace at night every night.  Wearing brace on wrist  for carpal tunnel.

## 2021-08-14 ENCOUNTER — Other Ambulatory Visit: Payer: Self-pay | Admitting: Family Medicine

## 2021-08-14 DIAGNOSIS — G5600 Carpal tunnel syndrome, unspecified upper limb: Secondary | ICD-10-CM

## 2021-08-16 ENCOUNTER — Other Ambulatory Visit: Payer: Self-pay | Admitting: Family Medicine

## 2021-08-16 MED ORDER — TRAMADOL HCL 50 MG PO TABS: 50.0000 mg | ORAL_TABLET | Freq: Three times a day (TID) | ORAL | 0 refills | Status: AC | PRN

## 2021-08-16 NOTE — Telephone Encounter (Signed)
Pt called yesterday evening right before 4 and today asking what he could do for the pain in his hand. He is also needing the name and number of the referral for hand surgery, I could not see anything in the Encounter.  Please Advise.

## 2021-08-16 NOTE — Telephone Encounter (Signed)
Spoke with Randall Parsons.  He states he had prednisone recently for something else and he didn't tolerate it very well.  He states he had insomnia and it upset his stomach.  He states he has used Tramadol and Vicodin in the past for pain meds.  He tolerates those as long as he takes it with food.  Using [facility] in [place].   I also advised his referral was in process so I don't know who he will be seeing yet but will also send phone note to referral so once he is schedule they can contact him with name and phone number of hand surgeon.

## 2021-08-16 NOTE — Progress Notes (Signed)
Let pt know I sent in tramadol... Can only send in 5 day supply at first given new controlled med.

## 2021-08-16 NOTE — Progress Notes (Signed)
[facility] notified as instructed by telephone.  Patient states understanding.

## 2021-08-27 DIAGNOSIS — M25532 Pain in left wrist: Secondary | ICD-10-CM | POA: Insufficient documentation

## 2021-08-27 NOTE — Assessment & Plan Note (Signed)
Can try increasing gabapentin to 300 mg 1-2 a night.   Ibuprofen 800 mg three times  daily prn for pain. Wear the brace at night every night.  Wearing brace on wrist  for carpal tunnel.

## 2021-08-30 ENCOUNTER — Other Ambulatory Visit: Payer: Self-pay | Admitting: Family Medicine

## 2021-09-03 ENCOUNTER — Telehealth: Payer: Self-pay | Admitting: Family Medicine

## 2021-09-03 NOTE — Telephone Encounter (Signed)
Please advise on below refill for tramadol for patient.

## 2021-09-03 NOTE — Telephone Encounter (Signed)
Last office visit 08/03/2021 with Dr. Ermalene Searing for Carpal Tunnel.  Last refilled 08/03/2021 for #90 with no refills.  No future appointments.  Refill?

## 2021-09-04 ENCOUNTER — Other Ambulatory Visit: Payer: Self-pay | Admitting: Family Medicine

## 2021-09-04 ENCOUNTER — Encounter: Payer: Self-pay | Admitting: Family Medicine

## 2021-09-04 MED ORDER — TRAMADOL HCL 50 MG PO TABS: 50.0000 mg | ORAL_TABLET | Freq: Three times a day (TID) | ORAL | 0 refills | Status: AC | PRN

## 2021-09-04 NOTE — Telephone Encounter (Signed)
I do not see a refill request below.

## 2021-09-04 NOTE — Telephone Encounter (Signed)
Note was sent that patient wants a refill on Tramadol from [facility] yesterday. Patient last saw Dr. Ermalene Searing on 08/03/21 and was given rx for tramadol. Patient stated he does not have appt to ortho until 09/20/21.

## 2021-09-04 NOTE — Progress Notes (Signed)
Prescription sent

## 2021-09-04 NOTE — Telephone Encounter (Signed)
Refilled

## 2021-09-19 ENCOUNTER — Emergency Department
Admission: EM | Admit: 2021-09-19 | Discharge: 2021-09-19 | Disposition: A | Discharge status: Home / Self Care | Payer: 59 | Attending: Emergency Medicine | Admitting: Emergency Medicine

## 2021-09-19 ENCOUNTER — Other Ambulatory Visit: Payer: Self-pay

## 2021-09-19 ENCOUNTER — Encounter: Payer: Self-pay | Admitting: Emergency Medicine

## 2021-09-19 ENCOUNTER — Emergency Department: Payer: 59

## 2021-09-19 DIAGNOSIS — M545 Low back pain, unspecified: Secondary | ICD-10-CM | POA: Diagnosis not present

## 2021-09-19 DIAGNOSIS — Z8616 Personal history of COVID-19: Secondary | ICD-10-CM | POA: Insufficient documentation

## 2021-09-19 DIAGNOSIS — R509 Fever, unspecified: Secondary | ICD-10-CM | POA: Diagnosis not present

## 2021-09-19 DIAGNOSIS — I110 Hypertensive heart disease with heart failure: Secondary | ICD-10-CM | POA: Insufficient documentation

## 2021-09-19 DIAGNOSIS — I509 Heart failure, unspecified: Secondary | ICD-10-CM | POA: Insufficient documentation

## 2021-09-19 DIAGNOSIS — Z87891 Personal history of nicotine dependence: Secondary | ICD-10-CM | POA: Diagnosis not present

## 2021-09-19 DIAGNOSIS — R109 Unspecified abdominal pain: Secondary | ICD-10-CM | POA: Diagnosis present

## 2021-09-19 LAB — URINALYSIS, ROUTINE W REFLEX MICROSCOPIC
Bilirubin Urine: NEGATIVE
Glucose, UA: NEGATIVE mg/dL
Hgb urine dipstick: NEGATIVE
Ketones, ur: NEGATIVE mg/dL
Leukocytes,Ua: NEGATIVE
Nitrite: NEGATIVE
Protein, ur: NEGATIVE mg/dL
Specific Gravity, Urine: 1.02 (ref 1.005–1.030)
pH: 5.5 (ref 5.0–8.0)

## 2021-09-19 LAB — COMPREHENSIVE METABOLIC PANEL
ALT: 27 U/L (ref 0–44)
AST: 34 U/L (ref 15–41)
Albumin: 4.1 g/dL (ref 3.5–5.0)
Alkaline Phosphatase: 72 U/L (ref 38–126)
Anion gap: 6 (ref 5–15)
BUN: 17 mg/dL (ref 6–20)
CO2: 27 mmol/L (ref 22–32)
Calcium: 9.2 mg/dL (ref 8.9–10.3)
Chloride: 106 mmol/L (ref 98–111)
Creatinine, Ser: 1.01 mg/dL (ref 0.61–1.24)
GFR, Estimated: >60 mL/min (ref >60)
Glucose, Bld: 88 mg/dL (ref 70–99)
Potassium: 4.3 mmol/L (ref 3.5–5.1)
Sodium: 139 mmol/L (ref 135–145)
Total Bilirubin: 0.8 mg/dL (ref 0.3–1.2)
Total Protein: 7.1 g/dL (ref 6.5–8.1)

## 2021-09-19 LAB — CBC
HCT: 42.9 % (ref 39.0–52.0)
Hemoglobin: 14.2 g/dL (ref 13.0–17.0)
MCH: 30.2 pg (ref 26.0–34.0)
MCHC: 33.1 g/dL (ref 30.0–36.0)
MCV: 91.3 fL (ref 80.0–100.0)
Platelets: 262 10*3/uL (ref 150–400)
RBC: 4.7 MIL/uL (ref 4.22–5.81)
RDW: 13.4 % (ref 11.5–15.5)
WBC: 4.5 10*3/uL (ref 4.0–10.5)
nRBC: 0 % (ref 0.0–0.2)

## 2021-09-19 MED ORDER — MORPHINE SULFATE (PF) 4 MG/ML IV SOLN: 4.0000 mg | INTRAVENOUS | Status: DC | PRN

## 2021-09-19 MED ORDER — KETOROLAC TROMETHAMINE 30 MG/ML IJ SOLN
15.0000 mg | Freq: Once | INTRAMUSCULAR | Status: AC
  Administered 2021-09-19: 12:00:00 15 mg via INTRAVENOUS
  Filled 2021-09-19: qty 1

## 2021-09-19 MED ORDER — ONDANSETRON HCL 4 MG/2ML IJ SOLN
4.0000 mg | Freq: Once | INTRAMUSCULAR | Status: AC
  Administered 2021-09-19: 10:00:00 4 mg via INTRAVENOUS
  Filled 2021-09-19: qty 2

## 2021-09-19 MED ORDER — SODIUM CHLORIDE 0.9 % IV BOLUS
1000.0000 mL | Freq: Once | INTRAVENOUS | Status: AC
  Administered 2021-09-19: 10:00:00 1000 mL via INTRAVENOUS

## 2021-09-19 MED ORDER — OXYCODONE-ACETAMINOPHEN 5-325 MG PO TABS: 1.0000 | ORAL_TABLET | Freq: Three times a day (TID) | ORAL | 0 refills | Status: AC | PRN

## 2021-09-19 MED ORDER — MORPHINE SULFATE (PF) 2 MG/ML IV SOLN
INTRAVENOUS | Status: AC
  Administered 2021-09-19: 10:00:00 4 mg via INTRAVENOUS
  Filled 2021-09-19: qty 2

## 2021-09-19 MED ORDER — TAMSULOSIN HCL 0.4 MG PO CAPS: 0.4000 mg | ORAL_CAPSULE | Freq: Every day | ORAL | 0 refills | Status: AC

## 2021-09-19 MED ORDER — ONDANSETRON HCL 4 MG PO TABS: 4.0000 mg | ORAL_TABLET | Freq: Three times a day (TID) | ORAL | 0 refills | Status: DC | PRN

## 2021-09-19 NOTE — ED Triage Notes (Signed)
C/O right flank pain, nausea, fever.  States has history of kidney stones, passed one recently on 12/5.  Current symptoms started yesterday morning.

## 2021-09-19 NOTE — ED Provider Notes (Signed)
[facility] Emergency Department Provider Note  ____________________________________________   Event Date/Time   First MD Initiated Contact with Patient 09/19/21 1133     (approximate)  I have reviewed the triage vital signs and the nursing notes.   HISTORY  Chief Complaint Flank Pain   HPI Randall Parsons is a 40 y.o. male with below noted past medical history including recently passed kidney stone on 12/5 having seen a stone intolerable who presents for assessment of recurrent right-sided low back pain and flank pain associated nausea that began last night.  Patient also reports he had a fever.  He denies any headache, earache, sore throat, vomiting, diarrhea, constipation, burning with urination, blood in his urine or other pain other than in the right lower back and right flank.  States this feels very similar to kidney pain he has had in the past when he has been passing stones on that side.  Denies any injuries or falls or rashes.  States he tries Tylenol ibuprofen at home but does not help much.  No other acute concerns at this time         Past Medical History:  Diagnosis Date   CHF (congestive heart failure) (HCC)    Exposure to hepatitis B    Exposure to hepatitis C    Hepatitis C    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, ending around 2017   Hypertension    IV drug abuse (HCC)    Renal disorder    kidney stones    Patient Active Problem List   Diagnosis Date Noted   Left wrist pain 08/27/2021   Bronchospasm 02/18/2021   COVID-19 vaccine dose declined 12/20/2020   Hepatitis C test positive 12/20/2020   Prolactin increased 12/20/2020   Advance care planning 12/20/2020   GAD (generalized anxiety disorder) 04/04/2020   Attention deficit hyperactivity disorder, combined type 04/04/2020   Opioid type dependence, continuous (HCC) 04/04/2020   Major depressive disorder 07/14/2019   Alcohol abuse 07/14/2019   Cocaine abuse  (HCC) 07/12/2019   Low back pain 11/28/2017   NICM (nonischemic cardiomyopathy) (HCC) 11/28/2017   Renal stone 11/28/2017   History of seizure 03/14/2017   IVDU (intravenous drug user) 03/14/2017   Other specified abnormal immunological findings in serum 12/21/2015    Past Surgical History:  Procedure Laterality Date   APPENDECTOMY     EYE SURGERY     secondary to dog bite   TIBIA FRACTURE SURGERY Right     Prior to Admission medications   Medication Sig Start Date End Date Taking? Authorizing Provider  ondansetron (ZOFRAN) 4 MG tablet Take 1 tablet (4 mg total) by mouth every 8 (eight) hours as needed for up to 10 doses for nausea or vomiting. 09/19/21  Yes Gilles Chiquito, MD  oxyCODONE-acetaminophen (PERCOCET) 5-325 MG tablet Take 1 tablet by mouth every 8 (eight) hours as needed for up to 3 days for severe pain. 09/19/21 09/22/21 Yes Gilles Chiquito, MD  tamsulosin (FLOMAX) 0.4 MG CAPS capsule Take 1 capsule (0.4 mg total) by mouth daily for 5 days. 09/19/21 09/24/21 Yes Gilles Chiquito, MD  albuterol (VENTOLIN HFA) 108 (90 Base) MCG/ACT inhaler INHALE 1-2 PUFFS BY MOUTH EVERY 6 HOURS AS NEEDED FOR WHEEZE OR SHORTNESS OF BREATH 07/31/21   Joaquim Nam, MD  buPROPion (WELLBUTRIN XL) 300 MG 24 hr tablet Take 300 mg by mouth daily.    [provider]  diazepam (VALIUM) 5 MG  tablet Take 5 mg by mouth 4 (four) times daily as needed. 12/14/20   [provider]  doxepin (SINEQUAN) 10 MG capsule Take 1 capsule (10 mg total) by mouth at bedtime as needed. 12/18/20   Joaquim Nam, MD  gabapentin (NEURONTIN) 300 MG capsule Take 2 capsules (600 mg total) by mouth 3 (three) times daily as needed. 12/18/20   Joaquim Nam, MD  ibuprofen (ADVIL) 800 MG tablet TAKE 1 TABLET BY MOUTH EVERY 8 HOURS AS NEEDED 09/03/21   Bedsole, Amy E, MD  lamoTRIgine (LAMICTAL) 200 MG tablet Take 1 tablet (200 mg total) by mouth daily. 12/18/20 03/18/21  Joaquim Nam, MD  SUBLOCADE 100  MG/0.5ML SOSY Inject 100 mg into the skin every 28 (twenty-eight) days. 11/20/20   [provider]  pantoprazole (PROTONIX) 40 MG tablet Take 1 tablet (40 mg total) by mouth daily. 07/17/19 08/03/19  Clapacs, Jackquline Denmark, MD    Allergies Amoxicillin and Prednisone  Family History  Problem Relation Age of Onset   Alcohol abuse Father    Melanoma Mother    Breast cancer Mother    Colon cancer Neg Hx    Prostate cancer Neg Hx     Social History Social History   Tobacco Use   Smoking status: Former   Smokeless tobacco: Never  Building services engineer Use: Every day  Substance Use Topics   Alcohol use: No   Drug use: Not Currently    Types: IV, Cocaine    Comment: hx of heroin abuse (opioid addiction), crack    Review of Systems  Review of Systems  Constitutional:  Negative for chills and fever.  HENT:  Negative for sore throat.   Eyes:  Negative for pain.  Respiratory:  Negative for cough and stridor.   Cardiovascular:  Negative for chest pain.  Gastrointestinal:  Positive for nausea. Negative for vomiting.  Genitourinary:  Positive for flank pain. Negative for dysuria.  Musculoskeletal:  Positive for back pain (R lower).  Skin:  Negative for rash.  Neurological:  Negative for seizures, loss of consciousness and headaches.  Psychiatric/Behavioral:  Negative for suicidal ideas.   All other systems reviewed and are negative.    ____________________________________________   PHYSICAL EXAM:  VITAL SIGNS: ED Triage Vitals  Enc Vitals Group     BP 09/19/21 0950 122/69     Pulse Rate 09/19/21 0950 73     Resp 09/19/21 0950 20     Temp 09/19/21 0950 98.2 F (36.8 C)     Temp Source 09/19/21 0950 Oral     SpO2 09/19/21 0950 99 %     Weight 09/19/21 0955 197 lb 15.6 oz (89.8 kg)     Height 09/19/21 0955 5\' 11"  (1.803 m)     Head Circumference --      Peak Flow --      Pain Score 09/19/21 0954 9     Pain Loc --      Pain Edu? --      Excl. in GC? --    Vitals:    09/19/21 0950  BP: 122/69  Pulse: 73  Resp: 20  Temp: 98.2 F (36.8 C)  SpO2: 99%   Physical Exam Vitals and nursing note reviewed.  Constitutional:      General: He is not in acute distress.    Appearance: He is well-developed.  HENT:     Head: Normocephalic and atraumatic.     Right Ear: External ear normal.  Left Ear: External ear normal.     Nose: Nose normal.  Eyes:     Conjunctiva/sclera: Conjunctivae normal.  Cardiovascular:     Rate and Rhythm: Normal rate and regular rhythm.     Heart sounds: No murmur heard. Pulmonary:     Effort: Pulmonary effort is normal. No respiratory distress.     Breath sounds: Normal breath sounds.  Abdominal:     Palpations: Abdomen is soft.     Tenderness: There is no abdominal tenderness. There is no right CVA tenderness or left CVA tenderness.  Musculoskeletal:        General: No swelling.     Cervical back: Neck supple.  Skin:    General: Skin is warm and dry.     Capillary Refill: Capillary refill takes less than 2 seconds.  Neurological:     Mental Status: [name] to person, place, and time.  Psychiatric:        Mood and Affect: Mood normal.     ____________________________________________   LABS (all labs ordered are listed, but only abnormal results are displayed)  Labs Reviewed  COMPREHENSIVE METABOLIC PANEL  CBC  URINALYSIS, ROUTINE W REFLEX MICROSCOPIC   ____________________________________________  EKG  ____________________________________________  RADIOLOGY  ED MD interpretation: CT abdomen pelvis shows bilateral nonobstructing kidney stones without any ureteral or bladder stones identified.  There is no perinephric stranding, diverticulitis, appendicitis or other acute abdominal pelvic process noted.  Official radiology report(s): CT Renal Stone Study  Result Date: 09/19/2021 CLINICAL DATA:  Right flank pain, nausea and fever. History of renal stones. EXAM: CT ABDOMEN AND PELVIS  WITHOUT CONTRAST TECHNIQUE: Multidetector CT imaging of the abdomen and pelvis was performed following the standard protocol without IV contrast. COMPARISON:  January 10, 2021 FINDINGS: Lower chest: No acute abnormality. Hepatobiliary: Unremarkable noncontrast appearance of the hepatic parenchyma. Gallbladder is unremarkable. No biliary ductal dilation. Pancreas: No pancreatic ductal dilation or evidence of acute inflammation. Spleen: Within normal limits. Adrenals/Urinary Tract: Bilateral adrenal glands are unremarkable. No hydronephrosis. Bilateral nonobstructive renal stones measuring up to 3 mm. No obstructive ureteral or bladder calculus identified. Left-sided pelvic phleboliths appear unchanged. Urinary bladder is unremarkable for degree of distension. Stomach/Bowel: No enteric contrast was administered. Stomach is unremarkable for degree of distension. No pathologic dilation of large or small bowel. The appendix is surgically absent. No evidence of acute bowel inflammation. Vascular/Lymphatic: No abdominal aortic aneurysm. No pathologically enlarged abdominal or pelvic lymph nodes. Reproductive: Prostate is unremarkable. Other: Nodularity in the subcutaneous anterior abdominal wall likely reflect sequela of subcutaneous injections. No significant abdominopelvic free fluid. Musculoskeletal: No acute osseous abnormality. IMPRESSION: 1. Bilateral nonobstructive nephrolithiasis. No obstructive ureteral or bladder calculus identified. 2. No acute findings in the abdomen or pelvis. Electronically Signed   By: Maudry Mayhew M.D.   On: 09/19/2021 12:08    ____________________________________________   PROCEDURES  Procedure(s) performed (including Critical Care):  Procedures   ____________________________________________   INITIAL IMPRESSION / ASSESSMENT AND PLAN / ED COURSE      Patient presents with above-stated history exam for assessment of recurrent right-sided low back pain and flank pain.  This  in the setting of known right-sided kidney stones and patient stating he recently passed a kidney stone on the right side after having pain in this area and seeing a stone come out of his urethra on 12/5.  On arrival he is afebrile he medically stable throughout reporting a fever last night.  Current differential considerations include pyonephritis, kidney  stone possibly complicated by an AKI, metabolic derangements and infection, appendicitis, diverticulitis, shingles, and MSK.  CBC shows no leukocytosis or acute anemia.  CMP shows no significant electrolyte or metabolic derangements.  No evidence of hepatitis or cholestasis.  UA not suggestive of infection.  CT abdomen pelvis shows bilateral nonobstructing kidney stones without any ureteral or bladder stones identified.  There is no perinephric stranding, diverticulitis, appendicitis or other acute abdominal pelvic process noted.  Concern for possible radial lucent stone versus recently passed stone causing some inflammation along the ureter possible urethra.  Patient states he is feeling much better after some Toradol although still has fairly significant pain.  States has had significant trouble tolerating NSAIDs orally due to some gastritis.  He is now tolerating p.o. and his nausea is much improved.  Will write Rx for Flomax Zofran and short course of Percocet.  He states he is tolerate this without difficulty despite being on epinephrine in the past although it is slightly less effective so we will give a very small short course of this with outpatient PCP and urology follow-up.  Discharged stable condition.  Strict return cautions advised and discussed.     ____________________________________________   FINAL CLINICAL IMPRESSION(S) / ED DIAGNOSES  Final diagnoses:  Flank pain    Medications  morphine 4 MG/ML injection 4 mg (4 mg Intravenous Not Given 09/19/21 1010)  ondansetron (ZOFRAN) injection 4 mg (4 mg Intravenous Given 09/19/21  1008)  morphine 2 MG/ML injection (4 mg Intravenous Given 09/19/21 1009)  sodium chloride 0.9 % bolus 1,000 mL (0 mLs Intravenous Stopped 09/19/21 1115)  ketorolac (TORADOL) 30 MG/ML injection 15 mg (15 mg Intravenous Given 09/19/21 1149)     ED Discharge Orders          Ordered    tamsulosin (FLOMAX) 0.4 MG CAPS capsule  Daily        09/19/21 1226    ondansetron (ZOFRAN) 4 MG tablet  Every 8 hours PRN        09/19/21 1226    oxyCODONE-acetaminophen (PERCOCET) 5-325 MG tablet  Every 8 hours PRN        09/19/21 1226             Note:  This document was prepared using Dragon voice recognition software and may include unintentional dictation errors.    Gilles Chiquito, MD 09/19/21 [phone]

## 2021-10-06 ENCOUNTER — Encounter: Payer: Self-pay | Admitting: Family Medicine

## 2021-10-10 DIAGNOSIS — R69 Illness, unspecified: Secondary | ICD-10-CM | POA: Diagnosis not present

## 2021-10-27 DIAGNOSIS — R69 Illness, unspecified: Secondary | ICD-10-CM | POA: Diagnosis not present

## 2021-11-07 DIAGNOSIS — F411 Generalized anxiety disorder: Secondary | ICD-10-CM | POA: Diagnosis not present

## 2021-11-07 DIAGNOSIS — F1410 Cocaine abuse, uncomplicated: Secondary | ICD-10-CM | POA: Diagnosis not present

## 2021-11-07 DIAGNOSIS — R69 Illness, unspecified: Secondary | ICD-10-CM | POA: Diagnosis not present

## 2021-11-07 DIAGNOSIS — F329 Major depressive disorder, single episode, unspecified: Secondary | ICD-10-CM | POA: Diagnosis not present

## 2021-11-07 DIAGNOSIS — G4700 Insomnia, unspecified: Secondary | ICD-10-CM | POA: Diagnosis not present

## 2021-11-09 IMAGING — US US SCROTUM
1 series · 14 of 25 positions shown · non-contrast
Comparison: None.

CLINICAL DATA: Left-sided scrotal/groin pain.

EXAM:
SCROTAL ULTRASOUND
DOPPLER ULTRASOUND OF THE TESTICLES
TECHNIQUE: Complete ultrasound examination of the testicles, epididymis, and
other scrotal structures was performed. Color and spectral Doppler
ultrasound were also utilized to evaluate blood flow to the
testicles.

[Series 1: us scrotum · 14 of 48 slices shown]
[im 1/48]
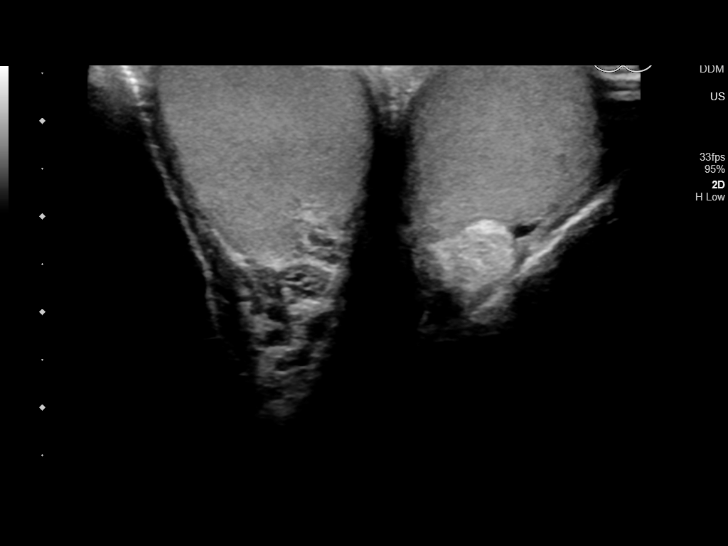
[im 4/48]
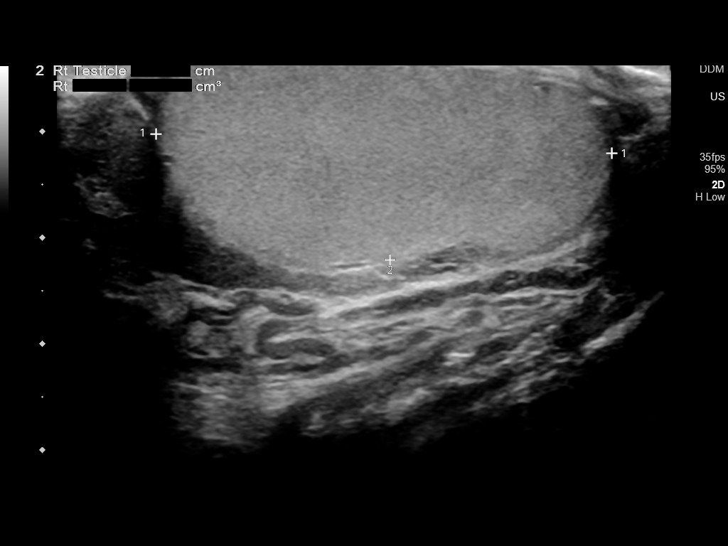
[im 8/48]
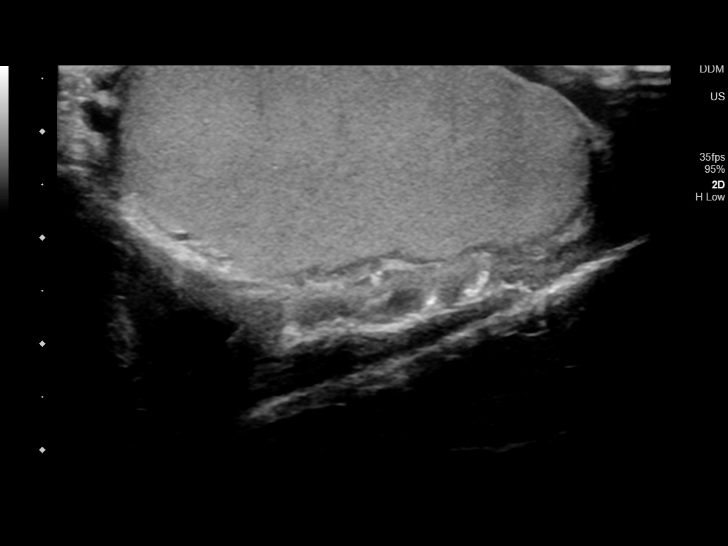
[im 12/48]
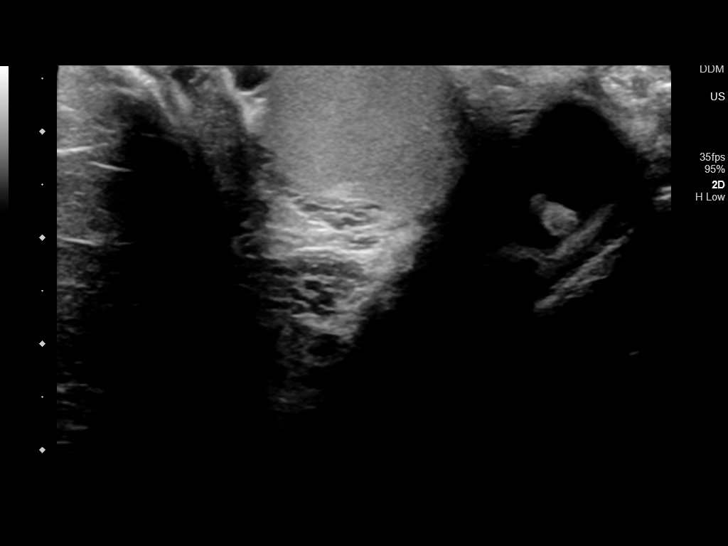
[im 16/48]
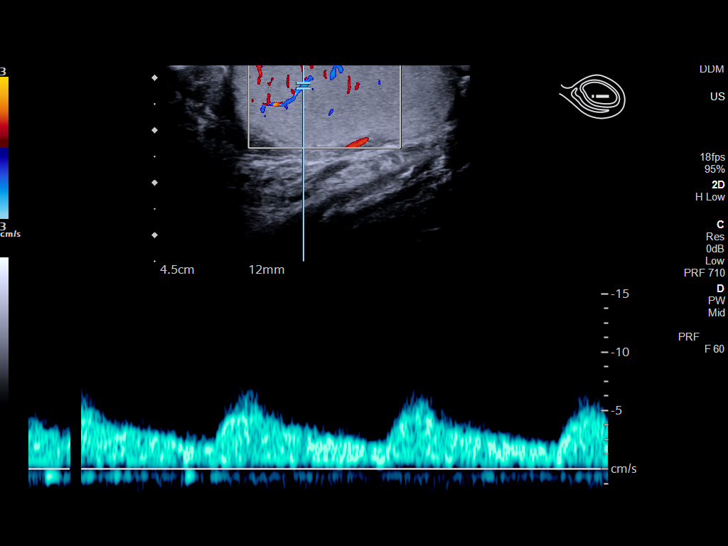
[im 18/48]
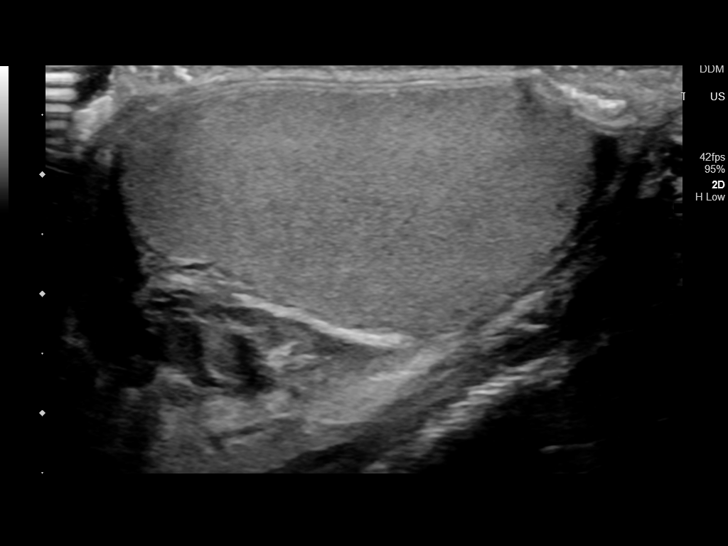
[im 22/48]
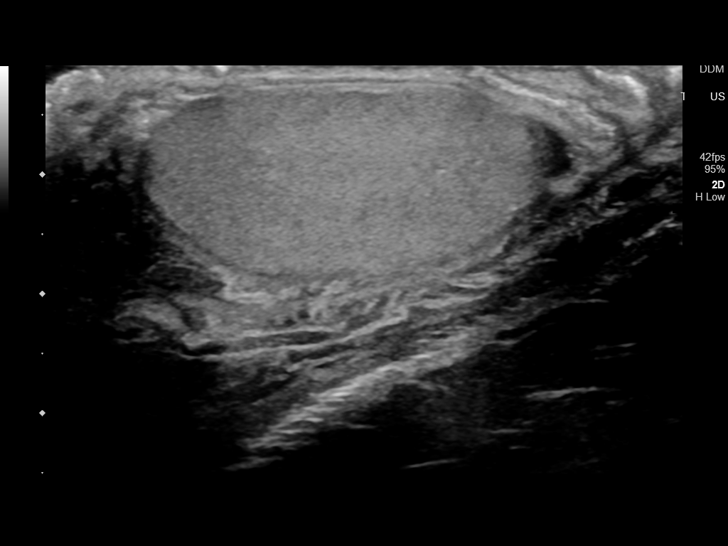
[im 26/48]
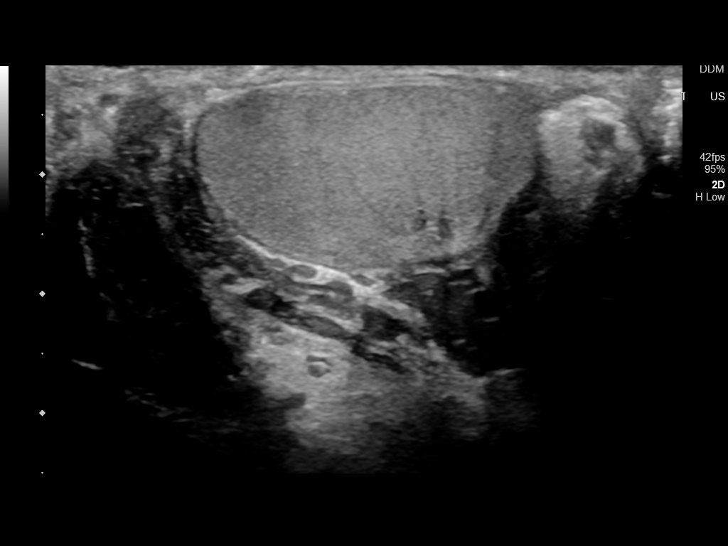
[im 30/48]
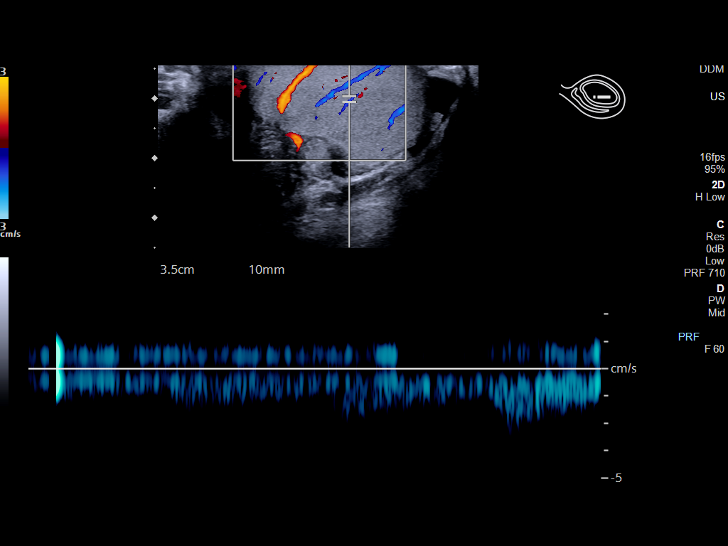
[im 32/48]
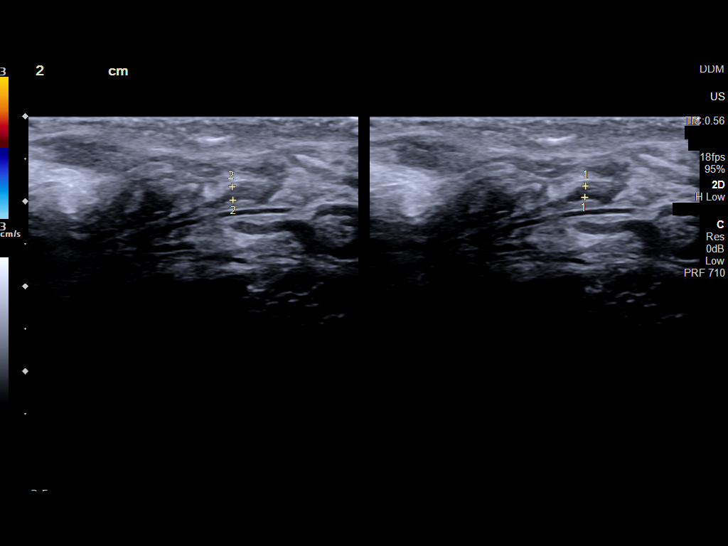
[im 36/48]
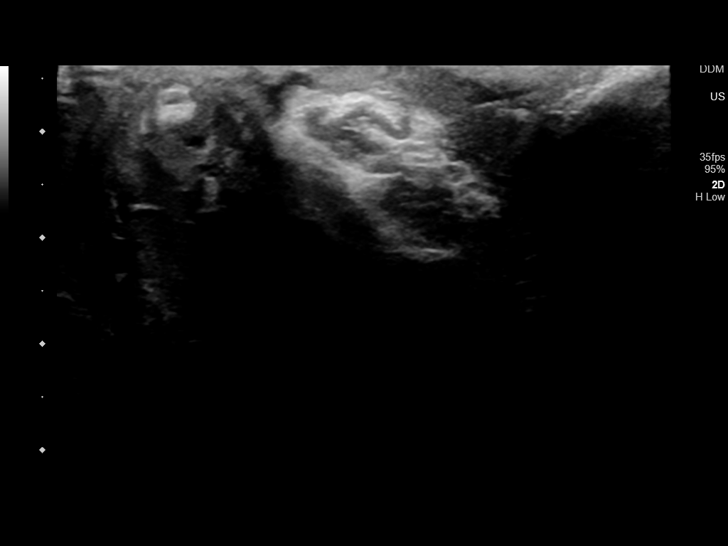
[im 40/48]
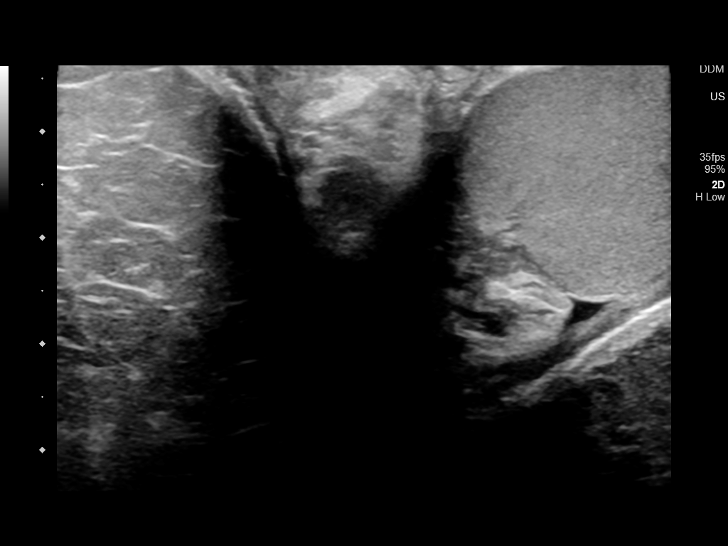
[im 44/48]
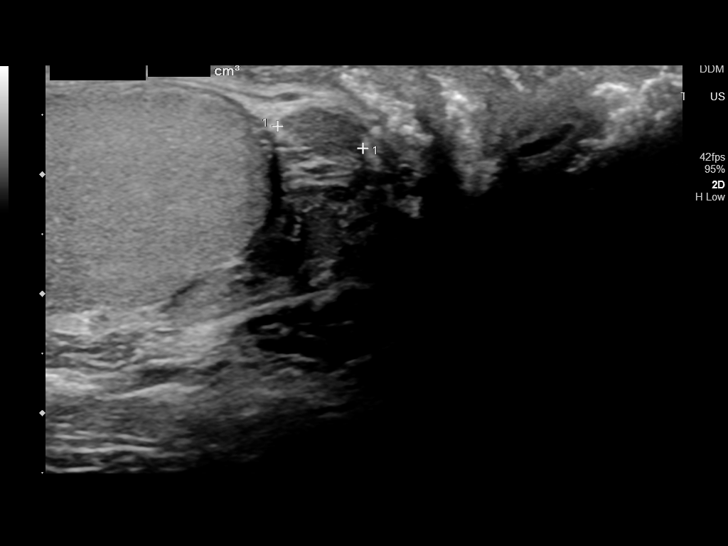
[im 48/48]
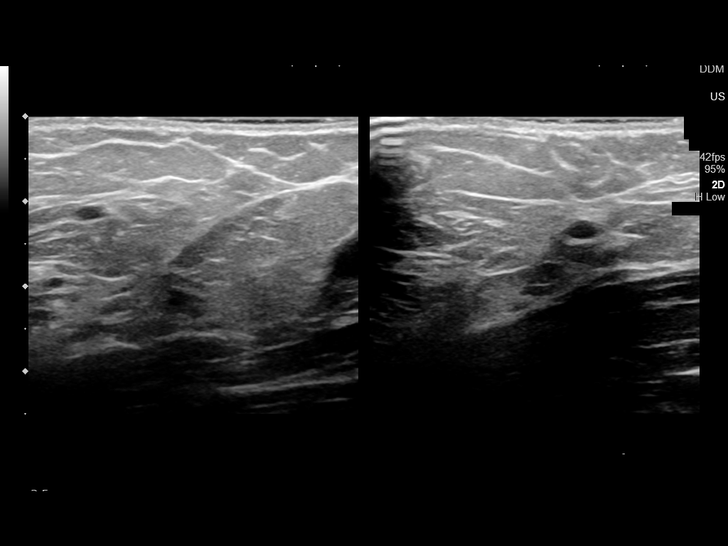

[14 of 25 positions shown; findings below may reference images not displayed]

FINDINGS: Right testicle

Measurements: 4.3 cm x 1.9 cm x 2.3 cm. No mass or microlithiasis
visualized.

Left testicle

Measurements: 4.0 cm x 2.0 cm x 2.6 cm. No mass or microlithiasis
visualized.

Right epididymis: A 5 mm x 4 mm cyst is seen within the head of the
right epididymis.

Left epididymis:  Normal in size and appearance.

Hydrocele:  None visualized.

Varicocele:  A small left-sided varicocele is seen.

Pulsed Doppler interrogation of both testes demonstrates normal low
resistance arterial and venous waveforms bilaterally.

Other: No hernia is identified.
IMPRESSION: 1. Small right epididymal head cyst.
2. Left-sided varicocele.

## 2021-11-19 DIAGNOSIS — F1410 Cocaine abuse, uncomplicated: Secondary | ICD-10-CM | POA: Diagnosis not present

## 2021-11-19 DIAGNOSIS — R69 Illness, unspecified: Secondary | ICD-10-CM | POA: Diagnosis not present

## 2021-11-19 DIAGNOSIS — G4700 Insomnia, unspecified: Secondary | ICD-10-CM | POA: Diagnosis not present

## 2021-11-21 DIAGNOSIS — R69 Illness, unspecified: Secondary | ICD-10-CM | POA: Diagnosis not present

## 2021-11-21 DIAGNOSIS — D2271 Melanocytic nevi of right lower limb, including hip: Secondary | ICD-10-CM | POA: Diagnosis not present

## 2021-11-21 DIAGNOSIS — D225 Melanocytic nevi of trunk: Secondary | ICD-10-CM | POA: Diagnosis not present

## 2021-11-21 DIAGNOSIS — F4323 Adjustment disorder with mixed anxiety and depressed mood: Secondary | ICD-10-CM | POA: Diagnosis not present

## 2021-11-21 DIAGNOSIS — Z1389 Encounter for screening for other disorder: Secondary | ICD-10-CM | POA: Diagnosis not present

## 2021-11-21 DIAGNOSIS — D2261 Melanocytic nevi of right upper limb, including shoulder: Secondary | ICD-10-CM | POA: Diagnosis not present

## 2021-11-21 DIAGNOSIS — D2272 Melanocytic nevi of left lower limb, including hip: Secondary | ICD-10-CM | POA: Diagnosis not present

## 2021-11-21 DIAGNOSIS — D2262 Melanocytic nevi of left upper limb, including shoulder: Secondary | ICD-10-CM | POA: Diagnosis not present

## 2021-11-22 DIAGNOSIS — R69 Illness, unspecified: Secondary | ICD-10-CM | POA: Diagnosis not present

## 2021-12-01 DIAGNOSIS — R69 Illness, unspecified: Secondary | ICD-10-CM | POA: Diagnosis not present

## 2021-12-06 ENCOUNTER — Ambulatory Visit (HOSPITAL_COMMUNITY)
Admission: EM | Admit: 2021-12-06 | Discharge: 2021-12-06 | Disposition: A | Discharge status: Home / Self Care | Payer: 59

## 2021-12-06 DIAGNOSIS — R69 Illness, unspecified: Secondary | ICD-10-CM | POA: Diagnosis not present

## 2021-12-06 DIAGNOSIS — R5383 Other fatigue: Secondary | ICD-10-CM | POA: Diagnosis not present

## 2021-12-06 DIAGNOSIS — Z1389 Encounter for screening for other disorder: Secondary | ICD-10-CM | POA: Diagnosis not present

## 2021-12-06 NOTE — BH Assessment (Addendum)
Pt made the decision to leave without being seen by medical provider. Pt left around 8:20pm.  ? ?Waiver of medical screening exam was signed and dated. ? ? ?
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
## 2021-12-11 ENCOUNTER — Other Ambulatory Visit: Payer: Self-pay

## 2021-12-11 ENCOUNTER — Emergency Department
Admission: EM | Admit: 2021-12-11 | Discharge: 2021-12-11 | Discharge status: Against Medical Advice | Payer: Medicaid Other | Attending: Emergency Medicine | Admitting: Emergency Medicine

## 2021-12-11 ENCOUNTER — Encounter: Payer: Self-pay | Admitting: Emergency Medicine

## 2021-12-11 DIAGNOSIS — R69 Illness, unspecified: Secondary | ICD-10-CM | POA: Diagnosis not present

## 2021-12-11 DIAGNOSIS — Z5321 Procedure and treatment not carried out due to patient leaving prior to being seen by health care provider: Secondary | ICD-10-CM | POA: Insufficient documentation

## 2021-12-11 DIAGNOSIS — F1513 Other stimulant abuse with withdrawal: Secondary | ICD-10-CM | POA: Insufficient documentation

## 2021-12-11 DIAGNOSIS — F419 Anxiety disorder, unspecified: Secondary | ICD-10-CM | POA: Insufficient documentation

## 2021-12-11 NOTE — ED Triage Notes (Addendum)
Arrives for detox from ' [facility]".  States last used this morning.  Denies SI/HI ? ?Patient AAOx3.  Patient anxious.   ?

## 2021-12-14 ENCOUNTER — Other Ambulatory Visit: Payer: Self-pay

## 2021-12-14 ENCOUNTER — Emergency Department (HOSPITAL_COMMUNITY)
Admission: EM | Admit: 2021-12-14 | Discharge: 2021-12-15 | Disposition: A | Discharge status: Home / Self Care | Payer: 59 | Attending: Emergency Medicine | Admitting: Emergency Medicine

## 2021-12-14 ENCOUNTER — Encounter (HOSPITAL_COMMUNITY): Payer: Self-pay

## 2021-12-14 DIAGNOSIS — T68XXXA Hypothermia, initial encounter: Secondary | ICD-10-CM | POA: Diagnosis not present

## 2021-12-14 DIAGNOSIS — R69 Illness, unspecified: Secondary | ICD-10-CM | POA: Diagnosis not present

## 2021-12-14 DIAGNOSIS — F1990 Other psychoactive substance use, unspecified, uncomplicated: Secondary | ICD-10-CM

## 2021-12-14 DIAGNOSIS — T699XXA Effect of reduced temperature, unspecified, initial encounter: Secondary | ICD-10-CM | POA: Diagnosis not present

## 2021-12-14 DIAGNOSIS — R443 Hallucinations, unspecified: Secondary | ICD-10-CM | POA: Diagnosis not present

## 2021-12-14 DIAGNOSIS — F1994 Other psychoactive substance use, unspecified with psychoactive substance-induced mood disorder: Secondary | ICD-10-CM | POA: Insufficient documentation

## 2021-12-14 DIAGNOSIS — Z20822 Contact with and (suspected) exposure to covid-19: Secondary | ICD-10-CM | POA: Insufficient documentation

## 2021-12-14 DIAGNOSIS — X31XXXA Exposure to excessive natural cold, initial encounter: Secondary | ICD-10-CM | POA: Diagnosis not present

## 2021-12-14 LAB — CBC WITH DIFFERENTIAL/PLATELET
Abs Immature Granulocytes: 0.01 10*3/uL (ref 0.00–0.07)
Basophils Absolute: 0 10*3/uL (ref 0.0–0.1)
Basophils Relative: 1 %
Eosinophils Absolute: 0.2 10*3/uL (ref 0.0–0.5)
Eosinophils Relative: 3 %
HCT: 43.8 % (ref 39.0–52.0)
Hemoglobin: 14 g/dL (ref 13.0–17.0)
Immature Granulocytes: 0 %
Lymphocytes Relative: 21 %
Lymphs Abs: 1.3 10*3/uL (ref 0.7–4.0)
MCH: 29.7 pg (ref 26.0–34.0)
MCHC: 32 g/dL (ref 30.0–36.0)
MCV: 93 fL (ref 80.0–100.0)
Monocytes Absolute: 0.7 10*3/uL (ref 0.1–1.0)
Monocytes Relative: 10 %
Neutro Abs: 4.2 10*3/uL (ref 1.7–7.7)
Neutrophils Relative %: 65 %
Platelets: 254 10*3/uL (ref 150–400)
RBC: 4.71 MIL/uL (ref 4.22–5.81)
RDW: 12.9 % (ref 11.5–15.5)
WBC: 6.3 10*3/uL (ref 4.0–10.5)
nRBC: 0 % (ref 0.0–0.2)

## 2021-12-14 MED ORDER — LORAZEPAM 1 MG PO TABS
1.0000 mg | ORAL_TABLET | Freq: Once | ORAL | Status: AC
  Administered 2021-12-14: 1 mg via ORAL
  Filled 2021-12-14: qty 1
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
## 2021-12-15 LAB — COMPREHENSIVE METABOLIC PANEL
ALT: 43 U/L (ref 0–44)
AST: 47 U/L — ABNORMAL HIGH (ref 15–41)
Albumin: 4.1 g/dL (ref 3.5–5.0)
Alkaline Phosphatase: 69 U/L (ref 38–126)
Anion gap: 9 (ref 5–15)
BUN: 7 mg/dL (ref 6–20)
CO2: 29 mmol/L (ref 22–32)
Calcium: 9 mg/dL (ref 8.9–10.3)
Chloride: 103 mmol/L (ref 98–111)
Creatinine, Ser: 1.13 mg/dL (ref 0.61–1.24)
GFR, Estimated: >60 mL/min (ref >60)
Glucose, Bld: 105 mg/dL — ABNORMAL HIGH (ref 70–99)
Potassium: 3.5 mmol/L (ref 3.5–5.1)
Sodium: 141 mmol/L (ref 135–145)
Total Bilirubin: 0.6 mg/dL (ref 0.3–1.2)
Total Protein: 6.7 g/dL (ref 6.5–8.1)

## 2021-12-15 LAB — RESP PANEL BY RT-PCR (FLU A&B, COVID) ARPGX2
Influenza A by PCR: NEGATIVE
Influenza B by PCR: NEGATIVE
SARS Coronavirus 2 by RT PCR: NEGATIVE

## 2021-12-15 LAB — ETHANOL: Alcohol, Ethyl (B): <10 mg/dL (ref <10)

## 2021-12-15 LAB — SALICYLATE LEVEL: Salicylate Lvl: <7 mg/dL — ABNORMAL LOW (ref 7.0–30.0)

## 2021-12-15 LAB — ACETAMINOPHEN LEVEL: Acetaminophen (Tylenol), Serum: <10 ug/mL — ABNORMAL LOW (ref 10–30)

## 2021-12-15 MED ORDER — GABAPENTIN 300 MG PO CAPS
300.0000 mg | ORAL_CAPSULE | Freq: Once | ORAL | Status: AC
Start: 2021-12-15 — End: 2021-12-15
  Administered 2021-12-15: 300 mg via ORAL
  Filled 2021-12-15: qty 1

## 2021-12-15 MED ORDER — DIAZEPAM 5 MG PO TABS
5.0000 mg | ORAL_TABLET | Freq: Four times a day (QID) | ORAL | Status: DC | PRN
  Administered 2021-12-15 (×2): 5 mg via ORAL
  Filled 2021-12-15 (×2): qty 1

## 2021-12-15 MED ORDER — DOXEPIN HCL 10 MG PO CAPS
10.0000 mg | ORAL_CAPSULE | Freq: Every evening | ORAL | Status: DC | PRN
  Filled 2021-12-15 (×2): qty 1

## 2021-12-15 NOTE — ED Notes (Signed)
Per BH SW, Pt has been psych cleared but can benefit from Substance Abuse and Homeless resources.  EDP made aware.  ?
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
## 2021-12-15 NOTE — ED Notes (Signed)
Tele-psych at bedside.

## 2021-12-15 NOTE — ED Notes (Signed)
Attempted to wake pt for TTS eval. Pt too sleepy to participate at this time. Will reach out when pt is more awake to be evaluated.  ?

## 2021-12-15 NOTE — ED Notes (Signed)
Pt requested "a few doses of diazepam" to take with him at discharge.  Pt educated that we cannot give him medication to take home.  Pt continues to request to stay until this evening and again, informed he is up for discharge.  Pt provided w/ location of EMS call ([address]).  Pt requests a taxi be called so he can go to that location.   ?

## 2021-12-15 NOTE — Care Management (Signed)
Homeless resources attached to chart , [facility] is open tonight for warming center. PCP recommendations on patient instructions. Patient to DC with outpatient resources for SA ( on pt instructions) and Homeless resources ?

## 2021-12-15 NOTE — ED Notes (Signed)
All belongs returned including clothing and items taken to Security (money, ID, and cards).  Pt given address to where EMS picked him up and to [name].  Pt believes he left his phone at [facility].  Pt verbalized understanding of all d/c instructions including resources and follow-up.  Denies questions.  Pt walked to lobby and will call himself a taxi.   ?

## 2021-12-15 NOTE — BH Assessment (Signed)
Clinician messaged Zachery Conch, RN: "Hey. It's Trey with TTS. Is the pt able to engage in the assessment, if so the pt will need to be placed in a private room. Also is the pt under IVC?"  ? ?Clinician awaiting response.  ? ? ?Vertell Novak, MS, [facility], CRC ?Triage Specialist ?[phone] ? ?

## 2021-12-15 NOTE — ED Provider Notes (Signed)
?  Physical Exam  ?BP 113/72   Pulse 82   Temp (!) 97.5 ?F (36.4 ?C) (Oral)   Resp 18   SpO2 100%  ? ?Physical Exam ? ?Procedures  ?Procedures ? ?ED Course / MDM  ?  ?Medical Decision Making ?Amount and/or Complexity of Data Reviewed ?Labs: ordered. ? ?Risk ?Prescription drug management. ? ? ?Seen by psychiatry and cleared for discharge.  Resources given.  Patient reportedly told nurse that if he gets sent home he is just going to say that he is suicidal again.  Likely has some secondary gain.  Will discharge ? ? ? ? ?  ?Benjiman Core, MD ?12/15/21 1353 ? ?

## 2021-12-15 NOTE — Discharge Instructions (Addendum)
Follow-up with the resources given. ?DAY CENTERS ?Chief Strategy Officer [facility]) ?Monday - Friday 8am - 3pm          Sat & Sun 8am - 2pm ?[address] [place], [place] [address]   [phone]     www.i[email or web address] ?IRC offers among other critical resources: showers, laundry, barbershop, phone bank, mailroom, computer lab, medical clinic, gardens and a bike maintenance area. ? ? ?AREA SHELTERS ? ?[employer]  ?(Men & women) ?[address] ?[place] [phone] ? ?[employer] [facility] (Men/women/families) ?[address] ?[place] [phone]  ? ?[facility] (Families with children) ?[address]  ?[place] [phone]  ? ?[facility] (Domestic Violence Shelter) ?[address]  ?[place] [phone]  ? ?[facility] (Children ages 23-17) ?[address]  ?[place] [phone]  ? ?[facility]   (Women & children) ?[address] [place] [phone]  ? ?[name] (Women/substance abuse) ?[address]  ?[place] [phone]  ? ?[facility] (Men) ?[address]  [place] [phone]  ? ?[facility] (Men) ?[address]  ?[place] [phone] ? ?[employer] (Women) ?[address]  ?[place] [phone]  ? ?Holiday representative (Single women & women with children) ?[address]  ?[place] [phone] ? ?[facility] (Men/women/families) ?[address]  ?[place] [phone]   ? ?[facility]   (Domestic Violence shelter) ?[address]  ?[place] [phone]  ? ?[facility] (Men & women) ?[address]  ?[place] [phone] ? ?[facility] (Men) ?[address]  ?[place] [phone]  ? ?[employer] (Men) ?[address]  ?[facility] [phone]  ? ?Holiday representative (Single women & families) ?[address]  ?[facility] [phone] ? ?[facility]  (Men/women & families) ?[address]  ?[place] [phone]  ? ? ?If you are at risk of losing your housing (throughout [facility]) call the Housing Hotline at 5151842403. You may also contact 2-1-1, a FREE service of the [address] that provides information about many resources including housing. Dial 211, or visit online at PooledIncome.pl. ?[facility]:  [facility] [place], contact Janelle Floor [phone] (men only) ?                         [facility] in [place]:  90 day homeless program for women and men;  ?                           contact Rev. Chambers [phone] ? ?[facility]:  [employer]:  men/women/children [phone] ? ?[facility]:  Shelter in [place] [phone] ?                      [employer] in [place], [place] [phone]  ? ?[place]:  [facility]risis Council for Abused Women, [phone]; (women and children) ? ?[facility]:  [employer], men/women/children; [phone] ?                          [facility] in [place], [phone]; substance abuse halfway house for men ?             [facility]; 4 bedroom house in [facility] for homeless women, contact Jenne Campus [phone] ?  [facility] in [place], [phone] (women and children) ?              [facility], for abused women and children, 24 hour crisis line, [phone], Jamison Oka ? [facility], halfway house for women, [place], [phone] ? ?[facility]:  [facility], [phone]; open Mon-Thurs from GGE36 - March 15 when temp is below 32 degrees ?                             [facility]; [place], [phone]; cell [phone]; open 24/7 ? ?[facility]:  [facility] - [place] - [phone] ? ?[place]:  transitional housing for women and children; [employer] [phone] ?

## 2021-12-15 NOTE — BH Assessment (Signed)
Clinician called the cart it was answered by Wende Bushy, RN, clinician noted pt is asleep and unable to engage in assessment. Clinician asked RN to let us know when the pt is alert and able to engage.  ? ? ?Redmond Pulling, MS, [facility] CRC ?Triage Specialist ?[phone] ? ?

## 2021-12-15 NOTE — BH Assessment (Signed)
Requested TTS cart @ 10:51am ?

## 2021-12-15 NOTE — ED Notes (Addendum)
Pt is upset he is being discharged.  Sts "if you just kick me out, I'm going to say that I'm suicidal.  I need to go somewhere that I can get back on my meds."  Pt informed TOC will come and speak to him about Substance Abuse Treatment and Homelessness resources.  ? ?Pt doesn't know where his car is located.  This Probation officer offered to call EMS and ask where he was picked up. ?

## 2021-12-15 NOTE — ED Notes (Signed)
Pt changed into purple scrubs and wanded by security. Pt's belongings placed in purple zone locker #1. Cash and cards counted and locked up with security.  ?
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
## 2021-12-15 NOTE — ED Notes (Signed)
Spoke to Pt's wife.  She is upset because she took at [facility] paperwork around 0400 Friday morning and they were never served.  She reports the Pt "has mental health problems beyond the drug use.  He is not allowed to come home because they have small children."  With Pt's permission, this writer spoke to Pt's wife regarding resources and follow-up instructions.  Also, she was educated on the [facility].  ?

## 2021-12-15 NOTE — ED Notes (Signed)
Pt reports difficulty w/ urination.  Sts he feels like he has to go, but can't.  Pt denies pain or having Hx of this issue.   Will continue to monitor.  ?

## 2021-12-17 ENCOUNTER — Telehealth (HOSPITAL_COMMUNITY): Payer: Self-pay | Admitting: Obstetrics & Gynecology

## 2021-12-17 ENCOUNTER — Other Ambulatory Visit (HOSPITAL_COMMUNITY)
Admission: EM | Admit: 2021-12-17 | Discharge: 2021-12-20 | Disposition: A | Discharge status: Home / Self Care | Payer: 59 | Attending: Psychiatry | Admitting: Psychiatry

## 2021-12-17 DIAGNOSIS — F13930 Sedative, hypnotic or anxiolytic use, unspecified with withdrawal, uncomplicated: Secondary | ICD-10-CM | POA: Diagnosis not present

## 2021-12-17 DIAGNOSIS — Z79899 Other long term (current) drug therapy: Secondary | ICD-10-CM | POA: Diagnosis not present

## 2021-12-17 DIAGNOSIS — F1414 Cocaine abuse with cocaine-induced mood disorder: Secondary | ICD-10-CM | POA: Diagnosis not present

## 2021-12-17 DIAGNOSIS — R69 Illness, unspecified: Secondary | ICD-10-CM | POA: Diagnosis not present

## 2021-12-17 DIAGNOSIS — F1911 Other psychoactive substance abuse, in remission: Secondary | ICD-10-CM | POA: Insufficient documentation

## 2021-12-17 DIAGNOSIS — F1494 Cocaine use, unspecified with cocaine-induced mood disorder: Secondary | ICD-10-CM | POA: Diagnosis present

## 2021-12-17 DIAGNOSIS — F331 Major depressive disorder, recurrent, moderate: Secondary | ICD-10-CM | POA: Diagnosis present

## 2021-12-17 DIAGNOSIS — Z20822 Contact with and (suspected) exposure to covid-19: Secondary | ICD-10-CM | POA: Insufficient documentation

## 2021-12-17 DIAGNOSIS — F13239 Sedative, hypnotic or anxiolytic dependence with withdrawal, unspecified: Secondary | ICD-10-CM | POA: Insufficient documentation

## 2021-12-17 LAB — CBC WITH DIFFERENTIAL/PLATELET
Abs Immature Granulocytes: 0 10*3/uL (ref 0.00–0.07)
Basophils Absolute: 0 10*3/uL (ref 0.0–0.1)
Basophils Relative: 1 %
Eosinophils Absolute: 0.3 10*3/uL (ref 0.0–0.5)
Eosinophils Relative: 6 %
HCT: 43.2 % (ref 39.0–52.0)
Hemoglobin: 13.9 g/dL (ref 13.0–17.0)
Immature Granulocytes: 0 %
Lymphocytes Relative: 37 %
Lymphs Abs: 1.8 10*3/uL (ref 0.7–4.0)
MCH: 30 pg (ref 26.0–34.0)
MCHC: 32.2 g/dL (ref 30.0–36.0)
MCV: 93.3 fL (ref 80.0–100.0)
Monocytes Absolute: 0.7 10*3/uL (ref 0.1–1.0)
Monocytes Relative: 14 %
Neutro Abs: 2.1 10*3/uL (ref 1.7–7.7)
Neutrophils Relative %: 42 %
Platelets: 282 10*3/uL (ref 150–400)
RBC: 4.63 MIL/uL (ref 4.22–5.81)
RDW: 13.2 % (ref 11.5–15.5)
WBC: 4.9 10*3/uL (ref 4.0–10.5)
nRBC: 0 % (ref 0.0–0.2)

## 2021-12-17 MED ORDER — ONDANSETRON 4 MG PO TBDP: 4.0000 mg | ORAL_TABLET | Freq: Four times a day (QID) | ORAL | Status: DC | PRN

## 2021-12-17 MED ORDER — BUPROPION HCL ER (SR) 150 MG PO TB12
150.0000 mg | ORAL_TABLET | Freq: Two times a day (BID) | ORAL | Status: DC
  Administered 2021-12-17 – 2021-12-20 (×6): 150 mg via ORAL
  Filled 2021-12-17: qty 14
  Filled 2021-12-17 (×6): qty 1

## 2021-12-17 MED ORDER — THIAMINE HCL 100 MG PO TABS
100.0000 mg | ORAL_TABLET | Freq: Every day | ORAL | Status: DC
  Administered 2021-12-18 – 2021-12-20 (×3): 100 mg via ORAL
  Filled 2021-12-17 (×3): qty 1

## 2021-12-17 MED ORDER — ALUM & MAG HYDROXIDE-SIMETH 200-200-20 MG/5ML PO SUSP: 30.0000 mL | ORAL | Status: DC | PRN

## 2021-12-17 MED ORDER — DIAZEPAM 5 MG PO TABS: 5.0000 mg | ORAL_TABLET | Freq: Four times a day (QID) | ORAL | Status: DC | PRN

## 2021-12-17 MED ORDER — GABAPENTIN 600 MG PO TABS
1200.0000 mg | ORAL_TABLET | Freq: Two times a day (BID) | ORAL | Status: DC
  Administered 2021-12-17 – 2021-12-20 (×6): 1200 mg via ORAL
  Filled 2021-12-17 (×2): qty 2
  Filled 2021-12-17: qty 28
  Filled 2021-12-17 (×4): qty 2

## 2021-12-17 MED ORDER — LOPERAMIDE HCL 2 MG PO CAPS: 2.0000 mg | ORAL_CAPSULE | ORAL | Status: DC | PRN

## 2021-12-17 MED ORDER — HYDROXYZINE HCL 25 MG PO TABS: 25.0000 mg | ORAL_TABLET | Freq: Four times a day (QID) | ORAL | Status: DC | PRN

## 2021-12-17 MED ORDER — ACETAMINOPHEN 325 MG PO TABS
650.0000 mg | ORAL_TABLET | Freq: Four times a day (QID) | ORAL | Status: DC | PRN
  Administered 2021-12-18 – 2021-12-19 (×3): 650 mg via ORAL
  Filled 2021-12-17 (×3): qty 2

## 2021-12-17 MED ORDER — ADULT MULTIVITAMIN W/MINERALS CH
1.0000 | ORAL_TABLET | Freq: Every day | ORAL | Status: DC
Start: 2021-12-18 — End: 2021-12-20
  Administered 2021-12-18 – 2021-12-20 (×3): 1 via ORAL
  Filled 2021-12-17 (×3): qty 1

## 2021-12-17 MED ORDER — DOXEPIN HCL 10 MG PO CAPS
10.0000 mg | ORAL_CAPSULE | Freq: Every evening | ORAL | Status: DC | PRN
  Filled 2021-12-17: qty 7

## 2021-12-17 MED ORDER — MAGNESIUM HYDROXIDE 400 MG/5ML PO SUSP
30.0000 mL | Freq: Every day | ORAL | Status: DC | PRN
  Administered 2021-12-19: 30 mL via ORAL
  Filled 2021-12-17: qty 30

## 2021-12-17 MED ORDER — DIAZEPAM 5 MG PO TABS
5.0000 mg | ORAL_TABLET | Freq: Once | ORAL | Status: AC
  Administered 2021-12-17: 5 mg via ORAL
  Filled 2021-12-17: qty 1

## 2021-12-17 MED ORDER — DIAZEPAM 5 MG PO TABS
5.0000 mg | ORAL_TABLET | Freq: Four times a day (QID) | ORAL | Status: DC | PRN
  Administered 2021-12-19 (×2): 5 mg via ORAL
  Filled 2021-12-17 (×2): qty 1

## 2021-12-17 MED ORDER — LAMOTRIGINE 25 MG PO TABS
25.0000 mg | ORAL_TABLET | Freq: Every day | ORAL | Status: DC
  Administered 2021-12-18 – 2021-12-20 (×3): 25 mg via ORAL
  Filled 2021-12-17: qty 7
  Filled 2021-12-17 (×4): qty 1
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
## 2021-12-17 NOTE — ED Notes (Signed)
Unable to obtain urine@this  time ?

## 2021-12-17 NOTE — BH Assessment (Signed)
Care Management - [place] Follow Up Discharges  ? ?Writer attempted to make contact with patient today and was unsuccessful.  Voice mail is full.  ? ?Per chart review, patient made the decision to leave without being seen by medical provider. Patient left on 8:20 p.m.  on 12-06-21.   ? ?The patient signed the waiver of medical screening exam was signed and dated. ?

## 2021-12-17 NOTE — ED Provider Notes (Incomplete)
Behavioral Health Admission H&P [facility] & OBS)  Date: 12/17/21 Patient Name: Randall Parsons MRN: [id number] Chief Complaint: No chief complaint on file.     Diagnoses:  Final diagnoses:  Cocaine abuse with cocaine-induced mood disorder ([place])  MDD (major depressive disorder), recurrent episode, moderate ([place])    HPI: ***  PHQ 2-9:  [place] Office Visit from 08/03/2021 in [place] [facility] from 12/03/2020 in [facility]  Thoughts that you would be better off dead, or of hurting yourself in some way Not at all More than half the days  PHQ-9 Total Score 3 18       Flowsheet Row ED from 12/14/2021 in [place] [facility] from 12/11/2021 in [facility] ED from 09/19/2021 in [place] No Risk No Risk No Risk        Total Time spent with patient: {Time; 15 min - 8 hours:17441}  Musculoskeletal  Strength & Muscle Tone: {desc; muscle tone:32375} Gait & Station: {PE GAIT ED [id number] Patient leans: {Patient Leans:[id number]}  Psychiatric Specialty Exam  Presentation General Appearance: Appropriate for Environment; Casual  Eye Contact:Fair  Speech:Clear and Coherent; Normal Rate  Speech Volume:Normal  Handedness:Right   Mood and Affect  Mood:Anxious; Depressed  Affect:Congruent   Thought Process  Thought Processes:Coherent  Descriptions of Associations:Intact  Orientation:Full (Time, Place and Person)  Thought Content:Logical  Diagnosis of Schizophrenia or Schizoaffective disorder in past: No  Duration of Psychotic Symptoms: Less than six months  Hallucinations:Hallucinations: None  Ideas of Reference:None  Suicidal Thoughts:Suicidal Thoughts: No  Homicidal Thoughts:Homicidal Thoughts: No   Sensorium  Memory:Immediate Good; Recent Good; Remote  Good  Judgment:Fair  Insight:Fair   Executive Functions  Concentration:Fair  Attention Span:Fair  [place] of Knowledge:Good  Language:Good   Psychomotor Activity  Psychomotor Activity:Psychomotor Activity: Restlessness   Assets  Assets:Communication Skills; Desire for Improvement; Physical Health; Social Support   Sleep  Sleep:Sleep: Fair   Nutritional Assessment (For OBS and FBC admissions only) Has the patient had a weight loss or gain of 10 pounds or more in the last 3 months?: Yes Has the patient had a decrease in food intake/or appetite?: Yes Does the patient have dental problems?: No Does the patient have eating habits or behaviors that may be indicators of an eating disorder including binging or inducing vomiting?: No Has the patient recently lost weight without trying?: 1 Has the patient been eating poorly because of a decreased appetite?: 1 Malnutrition Screening Tool Score: 2    Physical Exam ROS  Blood pressure (!) 134/96, pulse 86, temperature 97.8 F (36.6 C), temperature source Oral, resp. rate 18, SpO2 100 %. There is no height or weight on file to calculate BMI.  Past Psychiatric History: ***   Is the patient at risk to self? No  Has the patient been a risk to self in the past 6 months? No .    Has the patient been a risk to self within the distant past? Yes   Is the patient a risk to others? No   Has the patient been a risk to others in the past 6 months? No   Has the patient been a risk to others within the distant past? No   Past Medical History:  Past Medical History:  Diagnosis Date   CHF (congestive heart failure) ([place])    Exposure to hepatitis B    Exposure to  hepatitis C    Hepatitis C    History of opioid abuse ([place])    reports heroin abuse, ending around 2017   Hypertension    IV drug abuse ([place])    Renal disorder    kidney stones    Past Surgical History:  Procedure Laterality Date   APPENDECTOMY     EYE  SURGERY     secondary to dog bite   TIBIA FRACTURE SURGERY Right     Family History:  Family History  Problem Relation Age of Onset   Alcohol abuse Father    Melanoma Mother    Breast cancer Mother    Colon cancer Neg Hx    Prostate cancer Neg Hx     Social History:  Social History   Socioeconomic History   Marital status: Married    Spouse name: Not on file   Number of children: Not on file   Years of education: Not on file   Highest education level: Not on file  Occupational History   Not on file  Tobacco Use   Smoking status: Former   Smokeless tobacco: Never  Vaping Use   Vaping Use: Every day  Substance and Sexual Activity   Alcohol use: No   Drug use: Not Currently    Types: IV, Cocaine    Comment: hx of heroin abuse (opioid addiction), crack   Sexual activity: Not on file  Other Topics Concern   Not on file  Social History Narrative   Working at [employer] in [place].  Married, has 5 step kids (2 at home).  From [facility].  Freight forwarder.    Social Determinants of Health   Financial Resource Strain: Not on file  Food Insecurity: Not on file  Transportation Needs: Not on file  Physical Activity: Not on file  Stress: Not on file  Social Connections: Not on file  Intimate Partner Violence: Not on file    SDOH:  SDOH Screenings   Alcohol Screen: Not on file  Depression (PHQ2-9): Low Risk    PHQ-2 Score: 3  Financial Resource Strain: Not on file  Food Insecurity: Not on file  Housing: Not on file  Physical Activity: Not on file  Social Connections: Not on file  Stress: Not on file  Tobacco Use: Medium Risk   Smoking Tobacco Use: Former   Smokeless Tobacco Use: Never   Passive Exposure: Not on file  Transportation Needs: Not on file    Last Labs:  Admission on 12/14/2021, Discharged on 12/15/2021  Component Date Value Ref Range Status   SARS Coronavirus 2 by RT PCR 12/14/2021 NEGATIVE  NEGATIVE Final   Comment: (NOTE) SARS-CoV-2 target  nucleic acids are NOT DETECTED.  The SARS-CoV-2 RNA is generally detectable in upper respiratory specimens during the acute phase of infection. The lowest concentration of SARS-CoV-2 viral copies this assay can detect is 138 copies/mL. A negative result does not preclude SARS-Cov-2 infection and should not be used as the sole basis for treatment or other patient management decisions. A negative result may occur with  improper specimen collection/handling, submission of specimen other than nasopharyngeal swab, presence of viral mutation(s) within the areas targeted by this assay, and inadequate number of viral copies(<138 copies/mL). A negative result must be combined with clinical observations, patient history, and epidemiological information. The expected result is Negative.  Fact Sheet for Patients:  EntrepreneurPulse.com.au  Fact Sheet for Healthcare Providers:  IncredibleEmployment.be  This test is no  t yet approved or cleared by the [place] and  has been authorized for detection and/or diagnosis of SARS-CoV-2 by FDA under an Emergency Use Authorization (EUA). This EUA will remain  in effect (meaning this test can be used) for the duration of the COVID-19 declaration under Section 564(b)(1) of the Act, 21 U.S.C.section 360bbb-3(b)(1), unless the authorization is terminated  or revoked sooner.       Influenza A by PCR 12/14/2021 NEGATIVE  NEGATIVE Final   Influenza B by PCR 12/14/2021 NEGATIVE  NEGATIVE Final   Comment: (NOTE) The Xpert Xpress SARS-CoV-2/FLU/RSV plus assay is intended as an aid in the diagnosis of influenza from Nasopharyngeal swab specimens and should not be used as a sole basis for treatment. Nasal washings and aspirates are unacceptable for Xpert Xpress SARS-CoV-2/FLU/RSV testing.  Fact Sheet for Patients: EntrepreneurPulse.com.au  Fact Sheet for Healthcare  Providers: IncredibleEmployment.be  This test is not yet approved or cleared by the [place] FDA and has been authorized for detection and/or diagnosis of SARS-CoV-2 by FDA under an Emergency Use Authorization (EUA). This EUA will remain in effect (meaning this test can be used) for the duration of the COVID-19 declaration under Section 564(b)(1) of the Act, 21 U.S.C. section 360bbb-3(b)(1), unless the authorization is terminated or revoked.  Performed at [facility], [place] [address] [place], [place] [address]    Sodium 12/14/2021 141  135 - 145 mmol/L Final   Potassium 12/14/2021 3.5  3.5 - 5.1 mmol/L Final   Chloride 12/14/2021 103  98 - 111 mmol/L Final   CO2 12/14/2021 29  22 - 32 mmol/L Final   Glucose, Bld 12/14/2021 105 (H)  70 - 99 mg/dL Final   Glucose reference range applies only to samples taken after fasting for at least 8 hours.   BUN 12/14/2021 7  6 - 20 mg/dL Final   Creatinine, Ser 12/14/2021 1.13  0.61 - 1.24 mg/dL Final   Calcium 12/14/2021 9.0  8.9 - 10.3 mg/dL Final   Total Protein 12/14/2021 6.7  6.5 - 8.1 g/dL Final   Albumin 12/14/2021 4.1  3.5 - 5.0 g/dL Final   AST 12/14/2021 47 (H)  15 - 41 U/L Final   ALT 12/14/2021 43  0 - 44 U/L Final   Alkaline Phosphatase 12/14/2021 69  38 - 126 U/L Final   Total Bilirubin 12/14/2021 0.6  0.3 - 1.2 mg/dL Final   GFR, Estimated 12/14/2021 >60  >60 mL/min Final   Comment: (NOTE) Calculated using the CKD-EPI Creatinine Equation (2021)    Anion gap 12/14/2021 9  5 - 15 Final   Performed at [facility] [address], [place], [place] [address]   Alcohol, Ethyl (B) 12/14/2021 <10  <10 mg/dL Final   Comment: (NOTE) Lowest detectable limit for serum alcohol is 10 mg/dL.  For medical purposes only. Performed at [facility], [place] [address], [place], [place] [address]    WBC 12/14/2021 6.3  4.0 - 10.5 K/uL Final   RBC 12/14/2021 4.71  4.22 - 5.81 MIL/uL Final   Hemoglobin  12/14/2021 14.0  13.0 - 17.0 g/dL Final   HCT 12/14/2021 43.8  39.0 - 52.0 % Final   MCV 12/14/2021 93.0  80.0 - 100.0 fL Final   MCH 12/14/2021 29.7  26.0 - 34.0 pg Final   MCHC 12/14/2021 32.0  30.0 - 36.0 g/dL Final   RDW 12/14/2021 12.9  11.5 - 15.5 % Final   Platelets 12/14/2021 254  150 - 400 K/uL Final   nRBC 12/14/2021 0.0  0.0 - 0.2 % Final   Neutrophils Relative % 12/14/2021 65  % Final   Neutro Abs 12/14/2021 4.2  1.7 - 7.7 K/uL Final   Lymphocytes Relative 12/14/2021 21  % Final   Lymphs Abs 12/14/2021 1.3  0.7 - 4.0 K/uL Final   Monocytes Relative 12/14/2021 10  % Final   Monocytes Absolute 12/14/2021 0.7  0.1 - 1.0 K/uL Final   Eosinophils Relative 12/14/2021 3  % Final   Eosinophils Absolute 12/14/2021 0.2  0.0 - 0.5 K/uL Final   Basophils Relative 12/14/2021 1  % Final   Basophils Absolute 12/14/2021 0.0  0.0 - 0.1 K/uL Final   Immature Granulocytes 12/14/2021 0  % Final   Abs Immature Granulocytes 12/14/2021 0.01  0.00 - 0.07 K/uL Final   Performed at [facility], [place] [address] [place], [place] [address]   Acetaminophen (Tylenol), Serum 12/14/2021 <10 (L)  10 - 30 ug/mL Final   Comment: (NOTE) Therapeutic concentrations vary significantly. A range of 10-30 ug/mL  may be an effective concentration for many patients. However, some  are best treated at concentrations outside of this range. Acetaminophen concentrations >150 ug/mL at 4 hours after ingestion  and >50 ug/mL at 12 hours after ingestion are often associated with  toxic reactions.  Performed at [facility], [place] [address] [place], [place] [id number]    Salicylate Lvl 99991111 <7.0 (L)  7.0 - 30.0 mg/dL Final   Performed at [facility] [address], [place], [place] [address]  Admission on 09/19/2021, Discharged on 09/19/2021  Component Date Value Ref Range Status   Sodium 09/19/2021 139  135 - 145 mmol/L Final   Potassium 09/19/2021 4.3  3.5 - 5.1 mmol/L Final   Chloride  09/19/2021 106  98 - 111 mmol/L Final   CO2 09/19/2021 27  22 - 32 mmol/L Final   Glucose, Bld 09/19/2021 88  70 - 99 mg/dL Final   Glucose reference range applies only to samples taken after fasting for at least 8 hours.   BUN 09/19/2021 17  6 - 20 mg/dL Final   Creatinine, Ser 09/19/2021 1.01  0.61 - 1.24 mg/dL Final   Calcium 09/19/2021 9.2  8.9 - 10.3 mg/dL Final   Total Protein 09/19/2021 7.1  6.5 - 8.1 g/dL Final   Albumin 09/19/2021 4.1  3.5 - 5.0 g/dL Final   AST 09/19/2021 34  15 - 41 U/L Final   ALT 09/19/2021 27  0 - 44 U/L Final   Alkaline Phosphatase 09/19/2021 72  38 - 126 U/L Final   Total Bilirubin 09/19/2021 0.8  0.3 - 1.2 mg/dL Final   GFR, Estimated 09/19/2021 >60  >60 mL/min Final   Comment: (NOTE) Calculated using the CKD-EPI Creatinine Equation (2021)    Anion gap 09/19/2021 6  5 - 15 Final   Performed at [facility], [place], [place] [address]   WBC 09/19/2021 4.5  4.0 - 10.5 K/uL Final   RBC 09/19/2021 4.70  4.22 - 5.81 MIL/uL Final   Hemoglobin 09/19/2021 14.2  13.0 - 17.0 g/dL Final   HCT 09/19/2021 42.9  39.0 - 52.0 % Final   MCV 09/19/2021 91.3  80.0 - 100.0 fL Final   MCH 09/19/2021 30.2  26.0 - 34.0 pg Final   MCHC 09/19/2021 33.1  30.0 - 36.0 g/dL Final   RDW 09/19/2021 13.4  11.5 - 15.5 %  Final   Platelets 09/19/2021 262  150 - 400 K/uL Final   nRBC 09/19/2021 0.0  0.0 - 0.2 % Final   Performed at [facility], [place], [place] [address]   Color, Urine 09/19/2021 YELLOW  YELLOW Final   YELLOW   APPearance 09/19/2021 CLEAR  CLEAR Final   CLEAR   Specific Gravity, Urine 09/19/2021 1.020  1.005 - 1.030 Final   pH 09/19/2021 5.5  5.0 - 8.0 Final   Glucose, UA 09/19/2021 NEGATIVE  NEGATIVE mg/dL Final   Hgb urine dipstick 09/19/2021 NEGATIVE  NEGATIVE Final   Bilirubin Urine 09/19/2021 NEGATIVE  NEGATIVE Final   Ketones, ur 09/19/2021 NEGATIVE  NEGATIVE mg/dL Final   Protein, ur 09/19/2021 NEGATIVE   NEGATIVE mg/dL Final   Nitrite 09/19/2021 NEGATIVE  NEGATIVE Final   Leukocytes,Ua 09/19/2021 NEGATIVE  NEGATIVE Final   Comment: Microscopic not done on urines with negative protein, blood, leukocytes, nitrite, or glucose < 500 mg/dL. Performed at [facility], [address] [place], [place] [address]   Admission on 07/02/2021, Discharged on 07/02/2021  Component Date Value Ref Range Status   SARS Coronavirus 2 by RT PCR 07/02/2021 NEGATIVE  NEGATIVE Final   Comment: (NOTE) SARS-CoV-2 target nucleic acids are NOT DETECTED.  The SARS-CoV-2 RNA is generally detectable in upper respiratory specimens during the acute phase of infection. The lowest concentration of SARS-CoV-2 viral copies this assay can detect is 138 copies/mL. A negative result does not preclude SARS-Cov-2 infection and should not be used as the sole basis for treatment or other patient management decisions. A negative result may occur with  improper specimen collection/handling, submission of specimen other than nasopharyngeal swab, presence of viral mutation(s) within the areas targeted by this assay, and inadequate number of viral copies(<138 copies/mL). A negative result must be combined with clinical observations, patient history, and epidemiological information. The expected result is Negative.  Fact Sheet for Patients:  EntrepreneurPulse.com.au  Fact Sheet for Healthcare Providers:  IncredibleEmployment.be  This test is no                          t yet approved or cleared by the [place] FDA and  has been authorized for detection and/or diagnosis of SARS-CoV-2 by FDA under an Emergency Use Authorization (EUA). This EUA will remain  in effect (meaning this test can be used) for the duration of the COVID-19 declaration under Section 564(b)(1) of the Act, 21 U.S.C.section 360bbb-3(b)(1), unless the authorization is terminated  or revoked sooner.        Influenza A by PCR 07/02/2021 NEGATIVE  NEGATIVE Final   Influenza B by PCR 07/02/2021 NEGATIVE  NEGATIVE Final   Comment: (NOTE) The Xpert Xpress SARS-CoV-2/FLU/RSV plus assay is intended as an aid in the diagnosis of influenza from Nasopharyngeal swab specimens and should not be used as a sole basis for treatment. Nasal washings and aspirates are unacceptable for Xpert Xpress SARS-CoV-2/FLU/RSV testing.  Fact Sheet for Patients: EntrepreneurPulse.com.au  Fact Sheet for Healthcare Providers: IncredibleEmployment.be  This test is not yet approved or cleared by the [place] FDA and has been authorized for detection and/or diagnosis of SARS-CoV-2 by FDA under an Emergency Use Authorization (EUA). This EUA will remain in effect (meaning this test can be used) for the duration of the COVID-19 declaration under Section 564(b)(1) of the Act, 21 U.S.C. section 360bbb-3(b)(1), unless the authorization is terminated or revoked.  Performed at [facility], [phone] [place], [place] [address]     Allergies: Amoxicillin and Prednisone  PTA Medications: (Not in a hospital admission)   Medical Decision Making  ***    Recommendations  Based on my evaluation the patient does not appear to have an emergency medical condition.  Rozetta Nunnery, NP 12/17/21  10:22 PM

## 2021-12-17 NOTE — ED Notes (Signed)
Pt sitting in his chair by his bed eating a snack. Pt calm and cooperative. No c/o pain or distress. Will continue to monitor for safety ?

## 2021-12-17 NOTE — ED Notes (Signed)
Formatting of this note might be different from the original.  Pt sitting in his chair by his bed eating a snack. Pt calm and cooperative. No c/o pain or distress. Will continue to monitor for safety  Electronically signed by Maryln Manuel, LPN at 16/07/9603 11:33 PM EDT

## 2021-12-17 NOTE — ED Provider Notes (Signed)
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 09/19/2021 NEGATIVE  NEGATIVE Final    Ketones, ur 09/19/2021 NEGATIVE  NEGATIVE mg/dL Final    Protein, ur 72/53/6644 NEGATIVE  NEGATIVE mg/dL Final    Nitrite 03/47/4259 NEGATIVE  NEGATIVE Final    Leukocytes,Ua 09/19/2021 NEGATIVE  NEGATIVE Final    Comment: Microscopic not done on urines with negative protein, blood, leukocytes, nitrite, or glucose < 500 mg/dL.  Performed at [facility], [address] [place], [place] [address]    Admission on 07/02/2021, Discharged on 07/02/2021   Component Date Value Ref Range Status    SARS Coronavirus 2 by RT PCR 07/02/2021 NEGATIVE  NEGATIVE Final    Comment: (NOTE)  SARS-CoV-2 target nucleic acids are NOT DETECTED.    The SARS-CoV-2 RNA is generally detectable in upper respiratory  specimens during the acute phase of infection. The lowest  concentration of SARS-CoV-2  viral copies this assay can detect is  138 copies/mL. A negative result does not preclude SARS-Cov-2  infection and should not be used as the sole basis for treatment or  other patient management decisions. A negative result may occur with   improper specimen collection/handling, submission of specimen other  than nasopharyngeal swab, presence of viral mutation(s) within the  areas targeted by this assay, and inadequate number of viral  copies(<138 copies/mL). A negative result must be combined with  clinical observations, patient history, and epidemiological  information. The expected result is Negative.    Fact Sheet for Patients:   BloggerCourse.com    Fact Sheet for Healthcare  Providers:   SeriousBroker.it    This test is no                           t yet approved or cleared by the [place] and   has been authorized for detection and/or diagnosis of SARS-CoV-2 by  FDA under an Emergency Use Authorization (EUA). This EUA will remain   in effect (meaning this test can be used) for the duration of the  COVID-19 declaration under Section 564(b)(1) of the Act, 21  U.S.C.section 360bbb-3(b)(1), unless the authorization is terminated   or revoked sooner.        Influenza A by PCR 07/02/2021 NEGATIVE  NEGATIVE Final    Influenza B by PCR 07/02/2021 NEGATIVE  NEGATIVE Final    Comment: (NOTE)  The Xpert Xpress SARS-CoV-2/FLU/RSV plus assay is intended as an aid  in the diagnosis of influenza from Nasopharyngeal swab specimens and  should not be used as a sole basis for treatment. Nasal washings and  aspirates are unacceptable for Xpert Xpress SARS-CoV-2/FLU/RSV  testing.    Fact Sheet for Patients:  BloggerCourse.com    Fact Sheet for Healthcare Providers:  SeriousBroker.it    This test is not yet approved or cleared by the [place] FDA and  has been authorized for detection and/or diagnosis of SARS-CoV-2  by  FDA under an Emergency Use Authorization (EUA). This EUA will remain  in effect (meaning this test can be used) for the duration of the  COVID-19 declaration under Section 564(b)(1) of the Act, 21 U.S.C.  section 360bbb-3(b)(1), unless the authorization is terminated or  revoked.    Performed at [facility], [address] [place],  [place] [address]      Allergies: Amoxicillin and Prednisone    PTA Medications:   Prior to Admission medications    Medication Sig Start Date End Date Taking? Authorizing Provider   buPROPion (WELLBUTRIN SR) 150 MG 12 hr tablet Take 150 mg by mouth 2 (two) times daily. 12/03/21  Yes [provider]   doxepin (SINEQUAN) 10 MG/ML solution Take 5 mLs by mouth at bedtime. 12/06/21  Yes [provider]   gabapentin (NEURONTIN) 600 MG tablet Take 1,200 mg by mouth 2 (two) times daily. 12/17/21  Yes [provider]   albuterol (VENTOLIN HFA) 108 (90 Base) MCG/ACT inhaler INHALE 1-2 PUFFS BY MOUTH EVERY 6 HOURS AS NEEDED FOR WHEEZE OR SHORTNESS OF BREATH 07/31/21   Joaquim Nam, MD   Buprenorphine HCl-Naloxone HCl 8-2 MG FILM Place 0.5 strips under the tongue 2 (two) times daily. 12/01/21   [provider]   buPROPion (WELLBUTRIN XL) 300 MG 24 hr tablet Take 300 mg by mouth daily.    [provider]   cloNIDine (CATAPRES) 0.1 MG tablet Take 0.1 mg by mouth every 8 (eight) hours as needed. 10/10/21   [provider]   diazepam (VALIUM) 5 MG tablet Take 5 mg by mouth 4 (four) times daily as needed. 12/14/20   [provider]   doxepin (SINEQUAN) 10 MG capsule Take 1 capsule (10 mg total) by mouth at bedtime as needed. 12/18/20   Joaquim Nam, MD   gabapentin (NEURONTIN) 300 MG capsule Take 2 capsules (600 mg total) by mouth 3 (three) times daily as needed. 12/18/20   Joaquim Nam, MD   ibuprofen (ADVIL) 800 MG tablet TAKE 1 TABLET BY MOUTH EVERY 8 HOURS AS NEEDED 09/03/21  Bedsole, Amy E, MD   lamoTRIgine (LAMICTAL)  200 MG tablet Take 1 tablet (200 mg total) by mouth daily. 12/18/20 03/18/21  Joaquim Nam, MD   ondansetron (ZOFRAN) 4 MG tablet Take 1 tablet (4 mg total) by mouth every 8 (eight) hours as needed for up to 10 doses for nausea or vomiting. 09/19/21   Gilles Chiquito, MD   SUBLOCADE 100 MG/0.5ML SOSY Inject 100 mg into the skin every 28 (twenty-eight) days. 11/20/20   [provider]   SUBLOCADE 300 MG/1.5ML SOSY Inject 1.5 mLs into the skin every 28 (twenty-eight) days. 09/03/21   [provider]   traMADol (ULTRAM) 50 MG tablet Take 50 mg by mouth every 6 (six) hours as needed. 09/27/21   [provider]   pantoprazole (PROTONIX) 40 MG tablet Take 1 tablet (40 mg total) by mouth daily. 07/17/19 08/03/19  Clapacs, Jackquline Denmark, MD       Medical Decision Making   Admit to continuous assessment for crisis stabilization    Restart home medications:  -bupropion SR 150 mg BID for depression/substance abuse  -gabapentin 1200 mg BID  -doxepin 10 mg QHS prn for sleep  -restart lamictal at 25 mg daily for mood stability    Discussed with the patient restarting lamictal at 25 mg since the patient reported not having taken in several days. Patient verbalized understanding. However, patient later reported to nursing staff that he took his Lamictal prior to arrival at [facility].     Will not reorder home dose of valium at this time. However, will use valium as noted below for possible benzodiazepine withdraw.     Initiate CIWA protocol due to possible withdraw from valium  -give valium 5 mg oral x 1 dose now  -valium 5 mg every 6 hours prn for CIWA >10  -thiamine 100 mg daily for nutritional supplementation  -hydroxyzine 25 mg every 6 hours prn for anxiety, CIWA < or = 10  -ondansetron 4 mg ODT every 6 hours prn nausea/vomiting  -loperamide 2-4 mg capsule prn diarrhea or loose stools     Clinical Course as of 12/18/21 0418   Tue Dec 18, 2021   0133 Glucose(!): 111  Glucose 111-CMP otherwise unremarkable [JB]    0134 CBC with Differential/Platelet  CBC unremarkable [JB]   0134 LDL (calc)(!): 100  LDL 100-Lipid panel otherwise unremarkable [JB]   0352 Alcohol, Ethyl (B): <10 [JB]   0352 TSH: 0.686 [JB]     Clinical Course User Index  [JB] Jackelyn Poling, NP     Recommendations   Based on my evaluation the patient does not appear to have an emergency medical condition.    Jackelyn Poling, NP  12/18/21  4:18 AM  Electronically signed by Nelly Rout, MD at 12/25/2021  4:20 PM EDT    Associated attestation - Nelly Rout, MD - 12/25/2021  4:20 PM EDT  Formatting of this note might be different from the original.  Patient admitted for stabilization and treatment
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
## 2021-12-17 NOTE — ED Notes (Signed)
Formatting of this note might be different from the original.  Unable to obtain urine@this  time  Electronically signed by Maryln Manuel, LPN at 60/45/4098 11:07 PM EDT
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
## 2021-12-18 ENCOUNTER — Other Ambulatory Visit: Payer: Self-pay

## 2021-12-18 DIAGNOSIS — F1494 Cocaine use, unspecified with cocaine-induced mood disorder: Secondary | ICD-10-CM | POA: Diagnosis present

## 2021-12-18 LAB — LIPID PANEL
Cholesterol: 175 mg/dL (ref 0–200)
HDL: 50 mg/dL (ref >40)
LDL Cholesterol: 100 mg/dL — ABNORMAL HIGH (ref 0–99)
Total CHOL/HDL Ratio: 3.5 RATIO
Triglycerides: 127 mg/dL (ref <150)
VLDL: 25 mg/dL (ref 0–40)

## 2021-12-18 LAB — COMPREHENSIVE METABOLIC PANEL
ALT: 34 U/L (ref 0–44)
AST: 34 U/L (ref 15–41)
Albumin: 4 g/dL (ref 3.5–5.0)
Alkaline Phosphatase: 75 U/L (ref 38–126)
Anion gap: 8 (ref 5–15)
BUN: 12 mg/dL (ref 6–20)
CO2: 30 mmol/L (ref 22–32)
Calcium: 9.6 mg/dL (ref 8.9–10.3)
Chloride: 101 mmol/L (ref 98–111)
Creatinine, Ser: 0.96 mg/dL (ref 0.61–1.24)
GFR, Estimated: >60 mL/min (ref >60)
Glucose, Bld: 111 mg/dL — ABNORMAL HIGH (ref 70–99)
Potassium: 4.2 mmol/L (ref 3.5–5.1)
Sodium: 139 mmol/L (ref 135–145)
Total Bilirubin: 0.5 mg/dL (ref 0.3–1.2)
Total Protein: 6.7 g/dL (ref 6.5–8.1)

## 2021-12-18 LAB — POCT URINE DRUG SCREEN - MANUAL ENTRY (I-SCREEN)
POC Amphetamine UR: NOT DETECTED
POC Buprenorphine (BUP): POSITIVE — AB
POC Cocaine UR: POSITIVE — AB
POC Marijuana UR: POSITIVE — AB
POC Methadone UR: NOT DETECTED
POC Methamphetamine UR: NOT DETECTED
POC Morphine: NOT DETECTED
POC Oxazepam (BZO): POSITIVE — AB
POC Oxycodone UR: NOT DETECTED
POC Secobarbital (BAR): NOT DETECTED

## 2021-12-18 LAB — HEMOGLOBIN A1C
Hgb A1c MFr Bld: 5.9 % — ABNORMAL HIGH (ref 4.8–5.6)
Mean Plasma Glucose: 123 mg/dL

## 2021-12-18 LAB — RESP PANEL BY RT-PCR (FLU A&B, COVID) ARPGX2
Influenza A by PCR: NEGATIVE
Influenza B by PCR: NEGATIVE
SARS Coronavirus 2 by RT PCR: NEGATIVE

## 2021-12-18 LAB — POC SARS CORONAVIRUS 2 AG -  ED: SARS Coronavirus 2 Ag: NEGATIVE

## 2021-12-18 LAB — TSH: TSH: 0.686 u[IU]/mL (ref 0.350–4.500)

## 2021-12-18 LAB — ETHANOL: Alcohol, Ethyl (B): <10 mg/dL (ref <10)

## 2021-12-18 NOTE — ED Notes (Signed)
Patient admitted to [facility] from [facility].  He was sedated upon arrival and was falling asleep during intake assessment.  Patient states that he was up for "weeks" doing cocaine and he is very tired now.  He was pleasant and cooperative however difficult to engage in conversation due to somnolence.  Patient was shown to his room and he was given supplies and he went to sleep.  No evidence of acute withdrawal at this time.  He denied using etoh as he says that his father "died" due to alcohol use and he does not drink.  Encouraged him to seek out staff if overwhelmed by thoughts or feelings.  He denies avh shi or plan.  Will monitor and maintain safety while on unit.   ? ?
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
## 2021-12-18 NOTE — ED Provider Notes (Addendum)
Behavioral Health Progress Note ? ?Date and Time: 12/18/2021 6:23 PM ?Name: Randall Parsons ?MRN:  [id number] ? ?Subjective:   ?Randall Parsons, 41 y.o., male patient who presented to [facility] and was admitted to the continuous assessment unit for overnight observation.  He was involuntarily committed by his spouse.  Patient was seen face-to-face by this provider, consulted with Dr. Bronwen Betters; and chart reviewed on 12/18/2021.  ? ?Per chart review patient has a history of cocaine abuse with cocaine induced mood disorder, and MDD.  He is prescribed Wellbutrin SR 150 mg twice daily, doxepin 10 mg nightly as needed, gabapentin 1200 mg twice daily, and Lamictal 25 mg daily. Patient was seen by psychiatry in the [facility] ED on 12/15/2021 after being found laying in the driveway of a neighborhood.  He was psychiatrically cleared at that time.Patient reports admission to several substance abuse treatment programs in the past. He was psychiatrically hospitalized at [facility] for 3 days in 07/2019 for alcohol and cocaine abuse.  He denies any seizures or DTs from alcohol withdrawal.  Patient is married but states he is not allowed to to come home until he is "clean".  States he lives on the streets and has lived in [facility] at some point. ? ?On today's evaluation Randall Parsons is laying in his bed asleep.  He is easily awakened.  He appears sleepy throughout the assessment and states he feels "tired".  He is disheveled and makes good eye contact.  His speech is clear, coherent, normal rate and tone.  He endorses depression related to his substance use.  He endorses decreased sleep and appetite.  He denies AVH/SI/HI.  He admits he was experiencing auditory hallucinations 1 day ago before he presented to [facility].  States, "I thought I heard the police in the room next to me talking about how they were going to arrest me".  He admits to using cocaine during that time.  He contracts for safety.  Objectively he does not appear to be responding to  internal/external stimuli.  He denies delusional thought content.  He endorses some paranoia, states he always has to be guarded when he is living in hotels.  Patient remained calm throughout the assessment and has answered questions appropriately.  He is logical in his thought process.  He appears to have good insight. ? ?He does not meet criteria for inpatient psychiatric admission.  Nor does he meet criteria to uphold IVC.  Discussed substance abuse treatment and admission to [facility].  Explained the criteria in the milieu.  Patient is in agreement. ? ?Collateral: Kinneth Fujiwara (spouse) [phone].  Discussed IVC findings with spouse.  She is focused on patient's substance use.  She did not discuss any concerns related to patient being SI or HI.  Explained that patient does not meet the criteria for inpatient psychiatric admission nor does he meet the criteria to uphold IVC.  Explained the patient is willing to be treated in the [facility] for substance abuse.  Spouse states she is relieved and is thankful that he is seeking help.  She has no immediate safety concerns with resending the IVC. ?  ? ?Diagnosis:  ?Final diagnoses:  ?Cocaine abuse with cocaine-induced mood disorder (HCC)  ?MDD (major depressive disorder), recurrent episode, moderate (HCC)  ?Benzodiazepine withdrawal, uncomplicated (HCC)  ? ? ?Total Time spent with patient: 30 minutes ? ?Past Psychiatric History: See H&P see H&P ?Past Medical History:  ?Past Medical History:  ?Diagnosis Date  ? CHF (congestive heart failure) (  HCC)   ? Exposure to hepatitis B   ? Exposure to hepatitis C   ? Hepatitis C   ? History of opioid abuse (HCC)   ? reports heroin abuse, ending around 2017  ? Hypertension   ? IV drug abuse (HCC)   ? Renal disorder   ? kidney stones  ?  ?Past Surgical History:  ?Procedure Laterality Date  ? APPENDECTOMY    ? EYE SURGERY    ? secondary to dog bite  ? TIBIA FRACTURE SURGERY Right   ? ?Family History:  ?Family History  ?Problem Relation Age of  Onset  ? Alcohol abuse Father   ? Melanoma Mother   ? Breast cancer Mother   ? Colon cancer Neg Hx   ? Prostate cancer Neg Hx   ? ?Family Psychiatric  History: See H&P ?Social History:  ?Social History  ? ?Substance and Sexual Activity  ?Alcohol Use No  ?   ?Social History  ? ?Substance and Sexual Activity  ?Drug Use Not Currently  ? Types: IV, Cocaine  ? Comment: hx of heroin abuse (opioid addiction), crack  ?  ?Social History  ? ?Socioeconomic History  ? Marital status: Married  ?  Spouse name: Not on file  ? Number of children: Not on file  ? Years of education: Not on file  ? Highest education level: Not on file  ?Occupational History  ? Not on file  ?Tobacco Use  ? Smoking status: Former  ? Smokeless tobacco: Never  ?Vaping Use  ? Vaping Use: Every day  ?Substance and Sexual Activity  ? Alcohol use: No  ? Drug use: Not Currently  ?  Types: IV, Cocaine  ?  Comment: hx of heroin abuse (opioid addiction), crack  ? Sexual activity: Not on file  ?Other Topics Concern  ? Not on file  ?Social History Narrative  ? Working at [employer] in [place].  Married, has 5 step kids (2 at home).  From [facility].  Estate agent.   ? ?Social Determinants of Health  ? ?Financial Resource Strain: Not on file  ?Food Insecurity: Not on file  ?Transportation Needs: Not on file  ?Physical Activity: Not on file  ?Stress: Not on file  ?Social Connections: Not on file  ? ?SDOH:  ?SDOH Screenings  ? ?Alcohol Screen: Not on file  ?Depression (PHQ2-9): Low Risk   ? PHQ-2 Score: 3  ?Financial Resource Strain: Not on file  ?Food Insecurity: Not on file  ?Housing: Not on file  ?Physical Activity: Not on file  ?Social Connections: Not on file  ?Stress: Not on file  ?Tobacco Use: Medium Risk  ? Smoking Tobacco Use: Former  ? Smokeless Tobacco Use: Never  ? Passive Exposure: Not on file  ?Transportation Needs: Not on file  ? ?Additional Social History:  ?  ?Pain Medications: See MAR ?Prescriptions: See MAR ?Over the Counter: See MAR ?History of  alcohol / drug use?: Yes ?Longest period of sobriety (when/how long): Per chart, "Celebrated a year of sobriety in October 2019, then relapsed and later another six months clean and relapsed." Pt reports, he relasped eight months ago. ?Negative Consequences of Use: Personal relationships, Work / School ?Withdrawal Symptoms: None ?Name of Substance 1: Crack Cocaine. ?1 - Age of First Use: Since he was a teenager. ?1 - Amount (size/oz): Pt reprots, he uses two grams, daily. ?1 - Frequency: Daily. ?1 - Duration: Ongoing. ?1 - Last Use / Amount: Today. ?1 - Method of Aquiring: Purchase. ?1- Route of  Use: Smoke. ?Name of Substance 2: Benzodiazepines. ?2 - Age of First Use: Since he was a teenager. ?2 - Amount (size/oz): UTA ?2 - Frequency: Pt reports, he's not had his medications in about a week. ?2 - Duration: Ongoing. ?2 - Last Use / Amount: Per pt, 12/09/2021 or 12/11/2021. ?2 - Method of Aquiring: Prescribed by PCP. ?2 - Route of Substance Use: Oral. ?  ?  ?  ?  ? ?Sleep: Fair ? ?Appetite:  Fair ? ?Current Medications:  ?Current Facility-Administered Medications  ?Medication Dose Route Frequency Provider Last Rate Last Admin  ? acetaminophen (TYLENOL) tablet 650 mg  650 mg Oral Q6H PRN Nira Conn A, NP   650 mg at 12/18/21 1040  ? alum & mag hydroxide-simeth (MAALOX/MYLANTA) 200-200-20 MG/5ML suspension 30 mL  30 mL Oral Q4H PRN Nira Conn A, NP      ? buPROPion (WELLBUTRIN SR) 12 hr tablet 150 mg  150 mg Oral BID Nira Conn A, NP   150 mg at 12/18/21 1041  ? diazepam (VALIUM) tablet 5 mg  5 mg Oral Q6H PRN Jackelyn Poling, NP      ? doxepin (SINEQUAN) capsule 10 mg  10 mg Oral QHS PRN Nira Conn A, NP      ? gabapentin (NEURONTIN) tablet 1,200 mg  1,200 mg Oral BID Nira Conn A, NP   1,200 mg at 12/18/21 1040  ? hydrOXYzine (ATARAX) tablet 25 mg  25 mg Oral Q6H PRN Jackelyn Poling, NP      ? lamoTRIgine (LAMICTAL) tablet 25 mg  25 mg Oral Daily Nira Conn A, NP   25 mg at 12/18/21 1041  ? loperamide  (IMODIUM) capsule 2-4 mg  2-4 mg Oral PRN Nira Conn A, NP      ? magnesium hydroxide (MILK OF MAGNESIA) suspension 30 mL  30 mL Oral Daily PRN Nira Conn A, NP      ? multivitamin with minerals tablet 1 tab

## 2021-12-18 NOTE — Progress Notes (Signed)
Patient just woke up asking for PRN medication for pain, Patient received breakfast as well as tylenol and scheduled medications.RN did not observe any withdrawal symptoms, CIWA 0. Patient is logical and coherent in speech, cooperative and calm at this time. Nursing staff will continue to monitor. ?

## 2021-12-18 NOTE — ED Notes (Signed)
Pt has been sleeping for most of the morning< you did get for a late breakfast. ?

## 2021-12-18 NOTE — Progress Notes (Signed)
Patient given sandwich, chips and juice per request. ?

## 2021-12-18 NOTE — ED Notes (Signed)
Pt is currently sleeping, no distress noted, environmental check complete, will continue to monitor patient for safety. ? ?

## 2021-12-18 NOTE — ED Notes (Signed)
Pt sleeping@this time. Breathing even and unlabored. Will continue to monitor for safety 

## 2021-12-18 NOTE — Progress Notes (Signed)
Patient is resting unlabored respirations  no acute distress observed at this time. ?

## 2021-12-18 NOTE — ED Notes (Signed)
Pt had lunch and how he is sleeping ?

## 2021-12-18 NOTE — ED Notes (Signed)
Pt is in room sleeping, no distress noted, will continue to monitor patient for safety. 

## 2021-12-18 NOTE — ED Notes (Signed)
Patient declined to attended wrap up and AA group, even after encouragement from staff. ?

## 2021-12-18 NOTE — ED Notes (Signed)
While administering patients medication he asked for valium. This nurse advised the patient the medication has discontinued however I will reach out to the provider for more information ?

## 2021-12-18 NOTE — ED Notes (Signed)
Formatting of this note might be different from the original.  Pt has been sleeping for most of the morning< you did get for a late breakfast.  Electronically signed by Loleta Chance A at 12/18/2021 11:24 AM EDT

## 2021-12-18 NOTE — ED Provider Notes (Signed)
Formatting of this note is different from the original.  Behavioral Health Progress Note    Date and Time: 12/18/2021 6:23 PM  Name: Lucas Hicks  MRN:  [id number]    Subjective:    Lucas Hicks, 41 y.o., male patient who presented to [facility] and was admitted to the continuous assessment unit for overnight observation.  He was involuntarily committed by his spouse.  Patient was seen face-to-face by this provider, consulted with Dr. Bronwen Betters; and chart reviewed on 12/18/2021.     Per chart review patient has a history of cocaine abuse with cocaine induced mood disorder, and MDD.  He is prescribed Wellbutrin SR 150 mg twice daily, doxepin 10 mg nightly as needed, gabapentin 1200 mg twice daily, and Lamictal 25 mg daily. Patient was seen by psychiatry in the [facility] ED on 12/15/2021 after being found laying in the driveway of a neighborhood.  He was psychiatrically cleared at that time.Patient reports admission to several substance abuse treatment programs in the past. He was psychiatrically hospitalized at [facility] for 3 days in 07/2019 for alcohol and cocaine abuse.  He denies any seizures or DTs from alcohol withdrawal.  Patient is married but states he is not allowed to to come home until he is "clean".  States he lives on the streets and has lived in [facility] at some point.    On today's evaluation [name] is laying in his bed asleep.  He is easily awakened.  He appears sleepy throughout the assessment and states he feels "tired".  He is disheveled and makes good eye contact.  His speech is clear, coherent, normal rate and tone.  He endorses depression related to his substance use.  He endorses decreased sleep and appetite.  He denies AVH/SI/HI.  He admits he was experiencing auditory hallucinations 1 day ago before he presented to [facility].  States, "I thought I heard the police in the room next to me talking about how they were going to arrest me".  He admits to using cocaine during that time.  He contracts for  safety.  Objectively he does not appear to be responding to internal/external stimuli.  He denies delusional thought content.  He endorses some paranoia, states he always has to be guarded when he is living in hotels.  Patient remained calm throughout the assessment and has answered questions appropriately.  He is logical in his thought process.  He appears to have good insight.    He does not meet criteria for inpatient psychiatric admission.  Nor does he meet criteria to uphold IVC.  Discussed substance abuse treatment and admission to [facility].  Explained the criteria in the milieu.  Patient is in agreement.    Collateral: Lucas Hicks (spouse) [phone].  Discussed IVC findings with spouse.  She is focused on patient's substance use.  She did not discuss any concerns related to patient being SI or HI.  Explained that patient does not meet the criteria for inpatient psychiatric admission nor does he meet the criteria to uphold IVC.  Explained the patient is willing to be treated in the [facility] for substance abuse.  Spouse states she is relieved and is thankful that he is seeking help.  She has no immediate safety concerns with resending the IVC.      Diagnosis:   Final diagnoses:   Cocaine abuse with cocaine-induced mood disorder (HCC)   MDD (major depressive disorder), recurrent episode, moderate (HCC)   Benzodiazepine withdrawal, uncomplicated (HCC)  Total Time spent with patient: 30 minutes    Past Psychiatric History: See H&P see H&P  Past Medical History:   Past Medical History:   Diagnosis Date    CHF (congestive heart failure) (HCC)     Exposure to hepatitis B     Exposure to hepatitis C     Hepatitis C     History of opioid abuse (HCC)     reports heroin abuse, ending around 2017    Hypertension     IV drug abuse (HCC)     Renal disorder     kidney stones     Past Surgical History:   Procedure Laterality Date    APPENDECTOMY      EYE SURGERY      secondary to dog bite    TIBIA FRACTURE SURGERY Right       Family History:   Family History   Problem Relation Age of Onset    Alcohol abuse Father     Melanoma Mother     Breast cancer Mother     Colon cancer Neg Hx     Prostate cancer Neg Hx      Family Psychiatric  History: See H&P  Social History:   Social History     Substance and Sexual Activity   Alcohol Use No     Social History     Substance and Sexual Activity   Drug Use Not Currently    Types: IV, Cocaine    Comment: hx of heroin abuse (opioid addiction), crack     Social History     Socioeconomic History    Marital status: Married     Spouse name: Not on file    Number of children: Not on file    Years of education: Not on file    Highest education level: Not on file   Occupational History    Not on file   Tobacco Use    Smoking status: Former    Smokeless tobacco: Never   Vaping Use    Vaping Use: Every day   Substance and Sexual Activity    Alcohol use: No    Drug use: Not Currently     Types: IV, Cocaine     Comment: hx of heroin abuse (opioid addiction), crack    Sexual activity: Not on file   Other Topics Concern    Not on file   Social History Narrative    Working at [employer] in [place].  Married, has 5 step kids (2 at home).  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      Social Determinants of Health     Financial Resource Strain: Not on file   Food Insecurity: Not on file   Transportation Needs: Not on file   Physical Activity: Not on file   Stress: Not on file   Social Connections: Not on file     SDOH:   SDOH Screenings     Alcohol Screen: Not on file   Depression (PHQ2-9): Low Risk     PHQ-2 Score: 3   Financial Resource Strain: Not on file   Food Insecurity: Not on file   Housing: Not on file   Physical Activity: Not on file   Social Connections: Not on file   Stress: Not on file   Tobacco Use: Medium Risk    Smoking Tobacco Use: Former    Smokeless Tobacco Use: Never    Passive Exposure: Not on file   Transportation Needs: Not on file  Additional Social History:     Pain Medications: See  MAR  Prescriptions: See MAR  Over the Counter: See MAR  History of alcohol / drug use?: Yes  Longest period of sobriety (when/how long): Per chart, "Celebrated a year of sobriety in October 2019, then relapsed and later another six months clean and relapsed." Pt reports, he relasped eight months ago.  Negative Consequences of Use: Personal relationships, Work / School  Withdrawal Symptoms: None  Name of Substance 1: Crack Cocaine.  1 - Age of First Use: Since he was a teenager.  1 - Amount (size/oz): Pt reprots, he uses two grams, daily.  1 - Frequency: Daily.  1 - Duration: Ongoing.  1 - Last Use / Amount: Today.  1 - Method of Aquiring: Purchase.  1- Route of Use: Smoke.  Name of Substance 2: Benzodiazepines.  2 - Age of First Use: Since he was a teenager.  2 - Amount (size/oz): UTA  2 - Frequency: Pt reports, he's not had his medications in about a week.  2 - Duration: Ongoing.  2 - Last Use / Amount: Per pt, 12/09/2021 or 12/11/2021.  2 - Method of Aquiring: Prescribed by PCP.  2 - Route of Substance Use: Oral.            Sleep: Fair    Appetite:  Fair    Current Medications:   Current Facility-Administered Medications   Medication Dose Route Frequency Provider Last Rate Last Admin    acetaminophen (TYLENOL) tablet 650 mg  650 mg Oral Q6H PRN Nira Conn A, NP   650 mg at 12/18/21 1040    alum & mag hydroxide-simeth (MAALOX/MYLANTA) 200-200-20 MG/5ML suspension 30 mL  30 mL Oral Q4H PRN Jackelyn Poling, NP        buPROPion (WELLBUTRIN SR) 12 hr tablet 150 mg  150 mg Oral BID Nira Conn A, NP   150 mg at 12/18/21 1041    diazepam (VALIUM) tablet 5 mg  5 mg Oral Q6H PRN Jackelyn Poling, NP        doxepin (SINEQUAN) capsule 10 mg  10 mg Oral QHS PRN Jackelyn Poling, NP        gabapentin (NEURONTIN) tablet 1,200 mg  1,200 mg Oral BID Nira Conn A, NP   1,200 mg at 12/18/21 1040    hydrOXYzine (ATARAX) tablet 25 mg  25 mg Oral Q6H PRN Jackelyn Poling, NP        lamoTRIgine (LAMICTAL) tablet 25 mg  25 mg Oral Daily  Nira Conn A, NP   25 mg at 12/18/21 1041    loperamide (IMODIUM) capsule 2-4 mg  2-4 mg Oral PRN Jackelyn Poling, NP        magnesium hydroxide (MILK OF MAGNESIA) suspension 30 mL  30 mL Oral Daily PRN Jackelyn Poling, NP        multivitamin with minerals tablet 1 tablet  1 tablet Oral Daily Nira Conn A, NP   1 tablet at 12/18/21 1041    ondansetron (ZOFRAN-ODT) disintegrating tablet 4 mg  4 mg Oral Q6H PRN Nira Conn A, NP        thiamine tablet 100 mg  100 mg Oral Daily Nira Conn A, NP   100 mg at 12/18/21 1040     Current Outpatient Medications   Medication Sig Dispense Refill    albuterol (VENTOLIN HFA) 108 (90 Base) MCG/ACT inhaler INHALE 1-2 PUFFS BY MOUTH EVERY 6 HOURS AS NEEDED FOR WHEEZE  OR SHORTNESS OF BREATH (Patient taking differently: 1-2 puffs every 6 (six) hours as needed for wheezing or shortness of breath.) 8.5 each 1    buPROPion (WELLBUTRIN XL) 300 MG 24 hr tablet Take 300 mg by mouth daily.      cloNIDine (CATAPRES) 0.1 MG tablet Take 0.1 mg by mouth every 8 (eight) hours as needed (For agitation).      diazepam (VALIUM) 5 MG tablet Take 5 mg by mouth 4 (four) times daily.      doxepin (SINEQUAN) 10 MG/ML solution Take 5 mLs by mouth at bedtime.      gabapentin (NEURONTIN) 600 MG tablet Take 1,200 mg by mouth 2 (two) times daily as needed (For pain).      ibuprofen (ADVIL) 800 MG tablet TAKE 1 TABLET BY MOUTH EVERY 8 HOURS AS NEEDED (Patient taking differently: Take 800 mg by mouth every 8 (eight) hours as needed (For pain).) 90 tablet 0    lamoTRIgine (LAMICTAL) 200 MG tablet Take 1 tablet (200 mg total) by mouth daily.      SUBLOCADE 300 MG/1.5ML SOSY Inject 1.5 mLs into the skin every 28 (twenty-eight) days.       Labs   Lab Results:   Admission on 12/17/2021   Component Date Value Ref Range Status    SARS Coronavirus 2 by RT PCR 12/17/2021 NEGATIVE  NEGATIVE Final    Comment: (NOTE)  SARS-CoV-2 target nucleic acids are NOT DETECTED.    The SARS-CoV-2 RNA is generally detectable in  upper respiratory  specimens during the acute phase of infection. The lowest  concentration of SARS-CoV-2 viral copies this assay can detect is  138 copies/mL. A negative result does not preclude SARS-Cov-2  infection and should not be used as the sole basis for treatment or  other patient management decisions. A negative result may occur with   improper specimen collection/handling, submission of specimen other  than nasopharyngeal swab, presence of viral mutation(s) within the  areas targeted by this assay, and inadequate number of viral  copies(<138 copies/mL). A negative result must be combined with  clinical observations, patient history, and epidemiological  information. The expected result is Negative.    Fact Sheet for Patients:   BloggerCourse.com    Fact Sheet for Healthcare Providers:   SeriousBroker.it    This test is no                           t yet approved or cleared by the [place] FDA and   has been authorized for detection and/or diagnosis of SARS-CoV-2 by  FDA under an Emergency Use Authorization (EUA). This EUA will remain   in effect (meaning this test can be used) for the duration of the  COVID-19 declaration under Section 564(b)(1) of the Act, 21  U.S.C.section 360bbb-3(b)(1), unless the authorization is terminated   or revoked sooner.        Influenza A by PCR 12/17/2021 NEGATIVE  NEGATIVE Final    Influenza B by PCR 12/17/2021 NEGATIVE  NEGATIVE Final    Comment: (NOTE)  The Xpert Xpress SARS-CoV-2/FLU/RSV plus assay is intended as an aid  in the diagnosis of influenza from Nasopharyngeal swab specimens and  should not be used as a sole basis for treatment. Nasal washings and  aspirates are unacceptable for Xpert Xpress SARS-CoV-2/FLU/RSV  testing.    Fact Sheet for Patients:  BloggerCourse.com    Fact Sheet for Healthcare Providers:  SeriousBroker.it  This test is not yet approved or  cleared by the [place] and  has been authorized for detection and/or diagnosis of SARS-CoV-2 by  FDA under an Emergency Use Authorization (EUA). This EUA will remain  in effect (meaning this test can be used) for the duration of the  COVID-19 declaration under Section 564(b)(1) of the Act, 21 U.S.C.  section 360bbb-3(b)(1), unless the authorization is terminated or  revoked.    Performed at [facility], [address] [place], [place]  [address]     SARS Coronavirus 2 Ag 12/17/2021 Negative  Negative Preliminary    WBC 12/17/2021 4.9  4.0 - 10.5 K/uL Final    RBC 12/17/2021 4.63  4.22 - 5.81 MIL/uL Final    Hemoglobin 12/17/2021 13.9  13.0 - 17.0 g/dL Final    HCT 87/56/4332 43.2  39.0 - 52.0 % Final    MCV 12/17/2021 93.3  80.0 - 100.0 fL Final    MCH 12/17/2021 30.0  26.0 - 34.0 pg Final    MCHC 12/17/2021 32.2  30.0 - 36.0 g/dL Final    RDW 95/18/8416 13.2  11.5 - 15.5 % Final    Platelets 12/17/2021 282  150 - 400 K/uL Final    nRBC 12/17/2021 0.0  0.0 - 0.2 % Final    Neutrophils Relative % 12/17/2021 42  % Final    Neutro Abs 12/17/2021 2.1  1.7 - 7.7 K/uL Final    Lymphocytes Relative 12/17/2021 37  % Final    Lymphs Abs 12/17/2021 1.8  0.7 - 4.0 K/uL Final    Monocytes Relative 12/17/2021 14  % Final    Monocytes Absolute 12/17/2021 0.7  0.1 - 1.0 K/uL Final    Eosinophils Relative 12/17/2021 6  % Final    Eosinophils Absolute 12/17/2021 0.3  0.0 - 0.5 K/uL Final    Basophils Relative 12/17/2021 1  % Final    Basophils Absolute 12/17/2021 0.0  0.0 - 0.1 K/uL Final    Immature Granulocytes 12/17/2021 0  % Final    Abs Immature Granulocytes 12/17/2021 0.00  0.00 - 0.07 K/uL Final    Performed at [facility], [address], [place], [place] [address]    Sodium 12/17/2021 139  135 - 145 mmol/L Final    Potassium 12/17/2021 4.2  3.5 - 5.1 mmol/L Final    Chloride 12/17/2021 101  98 - 111 mmol/L Final    CO2 12/17/2021 30  22 - 32 mmol/L Final    Glucose, Bld 12/17/2021 111 (H)  70 - 99  mg/dL Final    Glucose reference range applies only to samples taken after fasting for at least 8 hours.    BUN 12/17/2021 12  6 - 20 mg/dL Final    Creatinine, Ser 12/17/2021 0.96  0.61 - 1.24 mg/dL Final    Calcium 16/10/930 9.6  8.9 - 10.3 mg/dL Final    Total Protein 12/17/2021 6.7  6.5 - 8.1 g/dL Final    Albumin 35/57/3220 4.0  3.5 - 5.0 g/dL Final    AST 25/42/7062 34  15 - 41 U/L Final    ALT 12/17/2021 34  0 - 44 U/L Final    Alkaline Phosphatase 12/17/2021 75  38 - 126 U/L Final    Total Bilirubin 12/17/2021 0.5  0.3 - 1.2 mg/dL Final    GFR, Estimated 12/17/2021 >60  >60 mL/min Final    Comment: (NOTE)  Calculated using the CKD-EPI Creatinine Equation (2021)  Anion gap 12/17/2021 8  5 - 15 Final    Performed at [facility], [address] [place], [place] [address]    Alcohol, Ethyl (B) 12/17/2021 <10  <10 mg/dL Final    Comment: (NOTE)  Lowest detectable limit for serum alcohol is 10 mg/dL.    For medical purposes only.  Performed at [facility], [address] [place], [place]  [address]     TSH 12/17/2021 0.686  0.350 - 4.500 uIU/mL Final    Comment: Performed by a 3rd Generation assay with a functional sensitivity of <=0.01 uIU/mL.  Performed at [facility], [address], [place], [place] [address]     POC Amphetamine UR 12/18/2021 None Detected  NONE DETECTED (Cut Off Level 1000 ng/mL) Final    POC Secobarbital (BAR) 12/18/2021 None Detected  NONE DETECTED (Cut Off Level 300 ng/mL) Final    POC Buprenorphine (BUP) 12/18/2021 Positive (A)  NONE DETECTED (Cut Off Level 10 ng/mL) Final    POC Oxazepam (BZO) 12/18/2021 Positive (A)  NONE DETECTED (Cut Off Level 300 ng/mL) Final    POC Cocaine UR 12/18/2021 Positive (A)  NONE DETECTED (Cut Off Level 300 ng/mL) Final    POC Methamphetamine UR 12/18/2021 None Detected  NONE DETECTED (Cut Off Level 1000 ng/mL) Final    POC Morphine 12/18/2021 None Detected  NONE DETECTED (Cut Off Level 300 ng/mL) Final    POC Oxycodone UR  12/18/2021 None Detected  NONE DETECTED (Cut Off Level 100 ng/mL) Final    POC Methadone UR 12/18/2021 None Detected  NONE DETECTED (Cut Off Level 300 ng/mL) Final    POC Marijuana UR 12/18/2021 Positive (A)  NONE DETECTED (Cut Off Level 50 ng/mL) Final    Cholesterol 12/17/2021 175  0 - 200 mg/dL Final    Triglycerides 12/17/2021 127  <150 mg/dL Final    HDL 27/25/3664 50  >40 mg/dL Final    Total CHOL/HDL Ratio 12/17/2021 3.5  RATIO Final    VLDL 12/17/2021 25  0 - 40 mg/dL Final    LDL Cholesterol 12/17/2021 100 (H)  0 - 99 mg/dL Final    Comment:         Total Cholesterol/HDL:CHD Risk  Coronary Heart Disease Risk Table                      Men   Women   1/2 Average Risk   3.4   3.3   Average Risk       5.0   4.4   2 X Average Risk   9.6   7.1   3 X Average Risk  23.4   11.0    Use the calculated Patient Ratio  above and the CHD Risk Table  to determine the patient's CHD Risk.    ATP III CLASSIFICATION (LDL):   <100     mg/dL   Optimal   403-474  mg/dL   Near or Above                     Optimal   130-159  mg/dL   Borderline   259-563  mg/dL   High   >875     mg/dL   Very High  Performed at [facility], [address], [place],  [address]    Admission on 12/14/2021, Discharged on 12/15/2021   Component Date Value Ref Range Status    SARS Coronavirus 2 by RT  PCR 12/14/2021 NEGATIVE  NEGATIVE Final    Comment: (NOTE)  SARS-CoV-2 target nucleic acids are NOT DETECTED.    The SARS-CoV-2 RNA is generally detectable in upper respiratory  specimens during the acute phase of infection. The lowest  concentration of SARS-CoV-2 viral copies this assay can detect is  138 copies/mL. A negative result does not preclude SARS-Cov-2  infection and should not be used as the sole basis for treatment or  other patient management decisions. A negative result may occur with   improper specimen collection/handling, submission of specimen other  than nasopharyngeal swab, presence of viral mutation(s) within the  areas  targeted by this assay, and inadequate number of viral  copies(<138 copies/mL). A negative result must be combined with  clinical observations, patient history, and epidemiological  information. The expected result is Negative.    Fact Sheet for Patients:   BloggerCourse.com    Fact Sheet for Healthcare Providers:   SeriousBroker.it    This test is no                           t yet approved or cleared by the [place] FDA and   has been authorized for detection and/or diagnosis of SARS-CoV-2 by  FDA under an Emergency Use Authorization (EUA). This EUA will remain   in effect (meaning this test can be used) for the duration of the  COVID-19 declaration under Section 564(b)(1) of the Act, 21  U.S.C.section 360bbb-3(b)(1), unless the authorization is terminated   or revoked sooner.        Influenza A by PCR 12/14/2021 NEGATIVE  NEGATIVE Final    Influenza B by PCR 12/14/2021 NEGATIVE  NEGATIVE Final    Comment: (NOTE)  The Xpert Xpress SARS-CoV-2/FLU/RSV plus assay is intended as an aid  in the diagnosis of influenza from Nasopharyngeal swab specimens and  should not be used as a sole basis for treatment. Nasal washings and  aspirates are unacceptable for Xpert Xpress SARS-CoV-2/FLU/RSV  testing.    Fact Sheet for Patients:  BloggerCourse.com    Fact Sheet for Healthcare Providers:  SeriousBroker.it    This test is not yet approved or cleared by the [place] FDA and  has been authorized for detection and/or diagnosis of SARS-CoV-2 by  FDA under an Emergency Use Authorization (EUA). This EUA will remain  in effect (meaning this test can be used) for the duration of the  COVID-19 declaration under Section 564(b)(1) of the Act, 21 U.S.C.  section 360bbb-3(b)(1), unless the authorization is terminated or  revoked.    Performed at [facility], [address], [place], [place]  [address]     Sodium 12/14/2021  141  135 - 145 mmol/L Final    Potassium 12/14/2021 3.5  3.5 - 5.1 mmol/L Final    Chloride 12/14/2021 103  98 - 111 mmol/L Final    CO2 12/14/2021 29  22 - 32 mmol/L Final    Glucose, Bld 12/14/2021 105 (H)  70 - 99 mg/dL Final    Glucose reference range applies only to samples taken after fasting for at least 8 hours.    BUN 12/14/2021 7  6 - 20 mg/dL Final    Creatinine, Ser 12/14/2021 1.13  0.61 - 1.24 mg/dL Final    Calcium 91/47/8295 9.0  8.9 - 10.3 mg/dL Final    Total Protein 12/14/2021 6.7  6.5 - 8.1 g/dL Final    Albumin 62/13/0865 4.1  3.5 - 5.0 g/dL Final    AST 65/78/4696 47 (H)  15 - 41 U/L Final    ALT 12/14/2021 43  0 - 44 U/L Final    Alkaline Phosphatase 12/14/2021 69  38 - 126 U/L Final    Total Bilirubin 12/14/2021 0.6  0.3 - 1.2 mg/dL Final    GFR, Estimated 12/14/2021 >60  >60 mL/min Final    Comment: (NOTE)  Calculated using the CKD-EPI Creatinine Equation (2021)     Anion gap 12/14/2021 9  5 - 15 Final    Performed at [facility], [address] [place], [place] [address]    Alcohol, Ethyl (B) 12/14/2021 <10  <10 mg/dL Final    Comment: (NOTE)  Lowest detectable limit for serum alcohol is 10 mg/dL.    For medical purposes only.  Performed at [facility], [address], [place], [place]  [address]     WBC 12/14/2021 6.3  4.0 - 10.5 K/uL Final    RBC 12/14/2021 4.71  4.22 - 5.81 MIL/uL Final    Hemoglobin 12/14/2021 14.0  13.0 - 17.0 g/dL Final    HCT 40/07/2724 43.8  39.0 - 52.0 % Final    MCV 12/14/2021 93.0  80.0 - 100.0 fL Final    MCH 12/14/2021 29.7  26.0 - 34.0 pg Final    MCHC 12/14/2021 32.0  30.0 - 36.0 g/dL Final    RDW 36/64/4034 12.9  11.5 - 15.5 % Final    Platelets 12/14/2021 254  150 - 400 K/uL Final    nRBC 12/14/2021 0.0  0.0 - 0.2 % Final    Neutrophils Relative % 12/14/2021 65  % Final    Neutro Abs 12/14/2021 4.2  1.7 - 7.7 K/uL Final    Lymphocytes Relative 12/14/2021 21  % Final    Lymphs Abs 12/14/2021 1.3  0.7 - 4.0 K/uL Final    Monocytes Relative  12/14/2021 10  % Final    Monocytes Absolute 12/14/2021 0.7  0.1 - 1.0 K/uL Final    Eosinophils Relative 12/14/2021 3  % Final    Eosinophils Absolute 12/14/2021 0.2  0.0 - 0.5 K/uL Final    Basophils Relative 12/14/2021 1  % Final    Basophils Absolute 12/14/2021 0.0  0.0 - 0.1 K/uL Final    Immature Granulocytes 12/14/2021 0  % Final    Abs Immature Granulocytes 12/14/2021 0.01  0.00 - 0.07 K/uL Final    Performed at [facility], [address] [place], [place] [address]    Acetaminophen (Tylenol), Serum 12/14/2021 <10 (L)  10 - 30 ug/mL Final    Comment: (NOTE)  Therapeutic concentrations vary significantly. A range of 10-30 ug/mL   may be an effective concentration for many patients. However, some   are best treated at concentrations outside of this range.  Acetaminophen concentrations >150 ug/mL at 4 hours after ingestion   and >50 ug/mL at 12 hours after ingestion are often associated with   toxic reactions.    Performed at [facility], [address] [place], [place]  [address]     Salicylate Lvl 12/14/2021 <7.0 (L)  7.0 - 30.0 mg/dL Final    Performed at [facility], [address], [place], [place] [address]   Admission on 09/19/2021, Discharged on 09/19/2021   Component Date Value Ref Range Status    Sodium 09/19/2021 139  135 - 145 mmol/L Final    Potassium 09/19/2021 4.3  3.5 - 5.1 mmol/L Final    Chloride 09/19/2021 106  98 - 111 mmol/L Final    CO2 09/19/2021 27  22 - 32 mmol/L Final    Glucose, Bld 09/19/2021 88  70 - 99 mg/dL Final    Glucose reference range applies only to samples taken after fasting for at least 8 hours.    BUN 09/19/2021 17  6 - 20 mg/dL Final    Creatinine, Ser 09/19/2021 1.01  0.61 - 1.24 mg/dL Final    Calcium 91/47/8295 9.2  8.9 - 10.3 mg/dL Final    Total Protein 09/19/2021 7.1  6.5 - 8.1 g/dL Final    Albumin 62/13/0865 4.1  3.5 - 5.0 g/dL Final    AST 78/46/9629 34  15 - 41 U/L Final    ALT 09/19/2021 27  0 - 44 U/L Final    Alkaline Phosphatase  09/19/2021 72  38 - 126 U/L Final    Total Bilirubin 09/19/2021 0.8  0.3 - 1.2 mg/dL Final    GFR, Estimated 09/19/2021 >60  >60 mL/min Final    Comment: (NOTE)  Calculated using the CKD-EPI Creatinine Equation (2021)     Anion gap 09/19/2021 6  5 - 15 Final    Performed at [facility], [address] [place], [place] [address]    WBC 09/19/2021 4.5  4.0 - 10.5 K/uL Final    RBC 09/19/2021 4.70  4.22 - 5.81 MIL/uL Final    Hemoglobin 09/19/2021 14.2  13.0 - 17.0 g/dL Final    HCT 32/44/0102 42.9  39.0 - 52.0 % Final    MCV 09/19/2021 91.3  80.0 - 100.0 fL Final    MCH 09/19/2021 30.2  26.0 - 34.0 pg Final    MCHC 09/19/2021 33.1  30.0 - 36.0 g/dL Final    RDW 72/53/6644 13.4  11.5 - 15.5 % Final    Platelets 09/19/2021 262  150 - 400 K/uL Final    nRBC 09/19/2021 0.0  0.0 - 0.2 % Final    Performed at [facility], [address] [place], [place] [address]    Color, Urine 09/19/2021 YELLOW  YELLOW Final    YELLOW    APPearance 09/19/2021 CLEAR  CLEAR Final    CLEAR    Specific Gravity, Urine 09/19/2021 1.020  1.005 - 1.030 Final    pH 09/19/2021 5.5  5.0 - 8.0 Final    Glucose, UA 09/19/2021 NEGATIVE  NEGATIVE mg/dL Final    Hgb urine dipstick 09/19/2021 NEGATIVE  NEGATIVE Final    Bilirubin Urine 09/19/2021 NEGATIVE  NEGATIVE Final    Ketones, ur 09/19/2021 NEGATIVE  NEGATIVE mg/dL Final    Protein, ur 25/95/6387 NEGATIVE  NEGATIVE mg/dL Final    Nitrite 56/43/3295 NEGATIVE  NEGATIVE Final    Leukocytes,Ua 09/19/2021 NEGATIVE  NEGATIVE Final    Comment: Microscopic not done on urines with negative protein, blood, leukocytes, nitrite, or glucose < 500 mg/dL.  Performed at [facility], [address] [place], [place] [address]    Admission on 07/02/2021, Discharged on 07/02/2021   Component Date Value Ref Range Status    SARS Coronavirus 2 by RT PCR 07/02/2021 NEGATIVE  NEGATIVE Final    Comment: (NOTE)  SARS-CoV-2 target nucleic acids are NOT DETECTED.    The SARS-CoV-2 RNA is  generally detectable in upper respiratory  specimens during the acute phase of infection. The lowest  concentration of SARS-CoV-2 viral copies this assay can detect is  138 copies/mL. A negative result  does not preclude SARS-Cov-2  infection and should not be used as the sole basis for treatment or  other patient management decisions. A negative result may occur with   improper specimen collection/handling, submission of specimen other  than nasopharyngeal swab, presence of viral mutation(s) within the  areas targeted by this assay, and inadequate number of viral  copies(<138 copies/mL). A negative result must be combined with  clinical observations, patient history, and epidemiological  information. The expected result is Negative.    Fact Sheet for Patients:   BloggerCourse.com    Fact Sheet for Healthcare Providers:   SeriousBroker.it    This test is no                           t yet approved or cleared by the [place] FDA and   has been authorized for detection and/or diagnosis of SARS-CoV-2 by  FDA under an Emergency Use Authorization (EUA). This EUA will remain   in effect (meaning this test can be used) for the duration of the  COVID-19 declaration under Section 564(b)(1) of the Act, 21  U.S.C.section 360bbb-3(b)(1), unless the authorization is terminated   or revoked sooner.        Influenza A by PCR 07/02/2021 NEGATIVE  NEGATIVE Final    Influenza B by PCR 07/02/2021 NEGATIVE  NEGATIVE Final    Comment: (NOTE)  The Xpert Xpress SARS-CoV-2/FLU/RSV plus assay is intended as an aid  in the diagnosis of influenza from Nasopharyngeal swab specimens and  should not be used as a sole basis for treatment. Nasal washings and  aspirates are unacceptable for Xpert Xpress SARS-CoV-2/FLU/RSV  testing.    Fact Sheet for Patients:  BloggerCourse.com    Fact Sheet for Healthcare Providers:  SeriousBroker.it    This test  is not yet approved or cleared by the [place] FDA and  has been authorized for detection and/or diagnosis of SARS-CoV-2 by  FDA under an Emergency Use Authorization (EUA). This EUA will remain  in effect (meaning this test can be used) for the duration of the  COVID-19 declaration under Section 564(b)(1) of the Act, 21 U.S.C.  section 360bbb-3(b)(1), unless the authorization is terminated or  revoked.    Performed at [facility], [address] [place],  [place] [address]      Blood Alcohol level:   Lab Results   Component Value Date    [facility] <10 12/17/2021    ETH <10 12/14/2021     Metabolic Disorder Labs:  Lab Results   Component Value Date    HGBA1C 5.8 (H) 12/03/2020    MPG 119.76 12/03/2020     Lab Results   Component Value Date    PROLACTIN 8.9 01/04/2021    PROLACTIN 16.2 (H) 12/03/2020     Lab Results   Component Value Date    CHOL 175 12/17/2021    TRIG 127 12/17/2021    HDL 50 12/17/2021    CHOLHDL 3.5 12/17/2021    VLDL 25 12/17/2021    LDLCALC 100 (H) 12/17/2021    LDLCALC 104 (H) 12/03/2020     Therapeutic Lab Levels:  No results found for: LITHIUM  No results found for: VALPROATE  No components found for:  CBMZ    Physical Findings     AUDIT      Flowsheet Row Admission (Discharged) from 07/14/2019 in [facility] INPATIENT BEHAVIORAL MEDICINE   Alcohol Use Disorder Identification Test Final Score (AUDIT)  21         PHQ2-9      Flowsheet Row Office Visit from 08/03/2021 in [facility] ED from 12/03/2020 in [facility]   PHQ-2 Total Score 0 4   PHQ-9 Total Score 3 18         Flowsheet Row ED from 12/17/2021 in [facility] ED from 12/14/2021 in [facility] EMERGENCY DEPARTMENT ED from 12/11/2021 in [facility] EMERGENCY DEPARTMENT   C-SSRS RISK CATEGORY Error: Q3, 4, or 5 should not be populated when Q2 is No No Risk No Risk           Musculoskeletal   Strength & Muscle Tone: within  normal limits  Gait & Station: normal  Patient leans: N/A    Psychiatric Specialty Exam   Presentation   General Appearance: Appropriate for Environment; Casual    Eye Contact:Fair    Speech:Clear and Coherent; Normal Rate    Speech Volume:Normal    Handedness:Right    Mood and Affect   Mood:Depressed    Affect:Congruent    Thought Process   Thought Processes:Coherent    Descriptions of Associations:Intact    Orientation:Full (Time, Place and Person)    Thought Content:Logical   Diagnosis of Schizophrenia or Schizoaffective disorder in past: No   Duration of Psychotic Symptoms: Less than six months     Hallucinations:Hallucinations: None    Ideas of Reference:None    Suicidal Thoughts:Suicidal Thoughts: No    Homicidal Thoughts:Homicidal Thoughts: No    Sensorium   Memory:Immediate Good; Recent Good; Remote Good    Judgment:Fair    Insight:Fair    Executive Functions   Concentration:Fair    Attention Span:Fair    Recall:Good    Fund of Knowledge:Good    Language:Good    Psychomotor Activity   Psychomotor Activity:Psychomotor Activity: Normal    Assets   Assets:Communication Skills; Desire for Improvement; Financial Resources/Insurance; Physical Health; Resilience; Social Support    Sleep   Sleep:Sleep: Poor  Number of Hours of Sleep: 5    Nutritional Assessment (For OBS and [facility]BC admissions only)  Has the patient had a weight loss or gain of 10 pounds or more in the last 3 months?: Yes  Has the patient had a decrease in food intake/or appetite?: Yes  Does the patient have dental problems?: No  Does the patient have eating habits or behaviors that may be indicators of an eating disorder including binging or inducing vomiting?: No  Has the patient recently lost weight without trying?: 1  Has the patient been eating poorly because of a decreased appetite?: 1  Malnutrition Screening Tool Score: 2    Physical Exam   Physical Exam  Vitals and nursing note reviewed.   Constitutional:       General: He is not in acute  distress.     Appearance: He is well-developed.   HENT:      Head: Normocephalic and atraumatic.   Eyes:      General:         Right eye: No discharge.         Left eye: No discharge.      Conjunctiva/sclera: Conjunctivae normal.   Cardiovascular:      Rate and Rhythm: Normal rate.   Pulmonary:      Effort: Pulmonary effort is normal. No respiratory distress.   Abdominal:      Palpations: Abdomen is soft.   Musculoskeletal:  General: No swelling. Normal range of motion.      Cervical back: Normal range of motion.   Skin:     Capillary Refill: Capillary refill takes less than 2 seconds.      Coloration: Skin is not jaundiced or pale.   Neurological:      Mental Status: [name] to person, place, and time.   Psychiatric:         Attention and Perception: Attention and perception normal.         Mood and Affect: Mood is anxious and depressed.         Speech: Speech normal.         Behavior: Behavior normal. Behavior is cooperative.         Thought Content: Thought content normal.         Cognition and Memory: Cognition normal.         Judgment: Judgment is impulsive.     Review of Systems   Constitutional: Negative.    HENT: Negative.     Eyes: Negative.    Respiratory: Negative.     Cardiovascular: Negative.    Musculoskeletal: Negative.    Skin: Negative.    Neurological: Negative.    Psychiatric/Behavioral:  Positive for depression and substance abuse. The patient is nervous/anxious.    Blood pressure 110/71, pulse 65, temperature 98.2 F (36.8 C), temperature source Oral, resp. rate 18, SpO2 100 %. There is no height or weight on file to calculate BMI.    Treatment Plan Summary:  Disposition: Daily contact with patient to assess and evaluate symptoms and progress in treatment and Medication management    Admit patient to the [facility] for substance abuse treatment.  IVC will be rescinded.  Admission orders placed for [facility]    Patient states he is not interested in residential substance abuse  treatment.  States he would like substance abuse treatment in the outpatient setting.  He is also requesting assistance for housing that could include halfway houses/[facility].     Ardis Hughs, NP  12/18/2021 6:23 PM      Electronically signed by Estella Husk, MD at 12/19/2021  3:30 PM EDT    Associated attestation - Estella Husk, MD - 12/19/2021  3:30 PM EDT  Formatting of this note might be different from the original.  I have reviewed the note by the NP and  I am in agreement with the assessment and plan. Patient to be transferred to the [facility]

## 2021-12-18 NOTE — Progress Notes (Signed)
Formatting of this note might be different from the original.  Patient given sandwich, chips and juice per request.  Electronically signed by Leamon Arnt, RN at 12/18/2021  3:29 PM EDT

## 2021-12-18 NOTE — ED Notes (Signed)
Formatting of this note might be different from the original.  Patient declined to attended wrap up and AA group, even after encouragement from staff.  Electronically signed by Sanjuana Letters at 12/18/2021  8:14 PM EDT
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
## 2021-12-18 NOTE — ED Notes (Signed)
Formatting of this note might be different from the original.  Pt had lunch and how he is sleeping  Electronically signed by Loleta Chance A at 12/18/2021  1:18 PM EDT

## 2021-12-18 NOTE — Progress Notes (Signed)
Formatting of this note might be different from the original.  Patient just woke up asking for PRN medication for pain, Patient received breakfast as well as tylenol and scheduled medications.RN did not observe any withdrawal symptoms, CIWA 0. Patient is logical and coherent in speech, cooperative and calm at this time. Nursing staff will continue to monitor.  Electronically signed by Leamon Arnt, RN at 12/18/2021 11:24 AM EDT

## 2021-12-18 NOTE — ED Notes (Signed)
Formatting of this note might be different from the original.  Pt is in room sleeping, no distress noted, will continue to monitor patient for safety.  Electronically signed by Ashok Norris, RN at 12/18/2021  7:28 PM EDT

## 2021-12-18 NOTE — ED Notes (Signed)
Formatting of this note might be different from the original.  Pt is currently sleeping, no distress noted, environmental check complete, will continue to monitor patient for safety.    Electronically signed by Ashok Norris, RN at 12/18/2021 11:23 PM EDT

## 2021-12-18 NOTE — ED Notes (Signed)
Formatting of this note might be different from the original.  While administering patients medication he asked for valium. This nurse advised the patient the medication has discontinued however I will reach out to the provider for more information  Electronically signed by Ashok Norris, RN at 12/18/2021  9:51 PM EDT

## 2021-12-18 NOTE — ED Notes (Signed)
Formatting of this note might be different from the original.  Pt sleeping@this  time. Breathing even and unlabored. Will continue to monitor for safety  Electronically signed by Maryln Manuel, LPN at 16/07/9603  3:04 AM EDT

## 2021-12-18 NOTE — Progress Notes (Signed)
Formatting of this note might be different from the original.  Patient is resting unlabored respirations  no acute distress observed at this time.  Electronically signed by Leamon Arnt, RN at 12/18/2021  8:05 AM EDT

## 2021-12-18 NOTE — ED Notes (Signed)
Formatting of this note might be different from the original.  Patient admitted to [facility] from [facility].  He was sedated upon arrival and was falling asleep during intake assessment.  Patient states that he was up for "weeks" doing cocaine and he is very tired now.  He was pleasant and cooperative however difficult to engage in conversation due to somnolence.  Patient was shown to his room and he was given supplies and he went to sleep.  No evidence of acute withdrawal at this time.  He denied using etoh as he says that his father "died" due to alcohol use and he does not drink.  Encouraged him to seek out staff if overwhelmed by thoughts or feelings.  He denies avh shi or plan.  Will monitor and maintain safety while on unit.      Electronically signed by Jenean Lindau, RN at 12/18/2021  4:53 PM EDT

## 2021-12-19 LAB — URINALYSIS, ROUTINE W REFLEX MICROSCOPIC
Bacteria, UA: NONE SEEN
Bilirubin Urine: NEGATIVE
Glucose, UA: NEGATIVE mg/dL
Hgb urine dipstick: NEGATIVE
Ketones, ur: NEGATIVE mg/dL
Nitrite: NEGATIVE
Protein, ur: NEGATIVE mg/dL
Specific Gravity, Urine: 1.008 (ref 1.005–1.030)
pH: 6 (ref 5.0–8.0)

## 2021-12-19 NOTE — ED Provider Notes (Signed)
Behavioral Health Progress Note  Date and Time: 12/19/2021 4:21 PM Name: Randall Parsons MRN:  [id number]  Subjective:   41 yo male with history of polysubstance abuse, MDD, SIMD/SIPD who presented tot he [facility] on 12/17/21 under IVC for psychosis. . Patient's psychosis cleared and IVC was discontinued; he reported desire for assistance with substance use and he was transferred to the [facility]. UDS+buprenorphine, benzos, cocaine. Etoh negative.  Patient seen and chart reviewed. He has been medication compliant and has been appropriate with staff and peers on the unit. On interview today, patient is found laying in bed in NAD. Patient states that he is "struggling" and states that he has been constipated for a couple days and has not urinated since yesterday. On further discussion, patient reports that he had a small BM this morning. Patient denies all other urinary sx including dysuria, pain, blood in the urine and is only able to report that he normally urinates more than he does currently. Discussed with patient that a UA and STD testing can be ordered as these can often present with urinary sx- patient declines STD testing reporting that is not an issue but is agreeable to UA.  Patient denies SI/HI/AVH. He states that his wife is looking for a room for him to rent as she does not want him to return home. Patient states that he has 2 step children aged 31 and 8 who also reside at the home. Patient states that he is not interested in substance use treatment at this time but does express interested in sober living. Patient is currently prescribed valium- discussed with patient that a sober living house will take accept people on controlled substances. Patient verbalized understanding and stated that he is not interested in stopping valium because "its the only thing that works". He does on to state that he has tried "every antidepressant" and that they were all unhelpful. He reports multiple hospitalizations for  "behavior" in the past. He denies alcohol use. He states that he has had numerous legal charges and states that he has a court date in April. Declines to provide further details. Discussed with patient that valium is ordered based on CIWA score and that it is not scheduled-patient verbalized undersanding.  Diagnosis:  Final diagnoses:  Cocaine abuse with cocaine-induced mood disorder (HCC)  MDD (major depressive disorder), recurrent episode, moderate (HCC)  Benzodiazepine withdrawal, uncomplicated (HCC)    Total Time spent with patient: 20 minutes  Past Psychiatric History: mdd, polysubstance abuse Past Medical History:  Past Medical History:  Diagnosis Date   CHF (congestive heart failure) (HCC)    Exposure to hepatitis B    Exposure to hepatitis C    Hepatitis C    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, ending around 2017   Hypertension    IV drug abuse (HCC)    Renal disorder    kidney stones    Past Surgical History:  Procedure Laterality Date   APPENDECTOMY     EYE SURGERY     secondary to dog bite   TIBIA FRACTURE SURGERY Right    Family History:  Family History  Problem Relation Age of Onset   Alcohol abuse Father    Melanoma Mother    Breast cancer Mother    Colon cancer Neg Hx    Prostate cancer Neg Hx    Family Psychiatric  History: substance use and bipolar disorder Social History:  Social History   Substance and Sexual Activity  Alcohol Use No     Social History   Substance and Sexual Activity  Drug Use Not Currently   Types: IV, Cocaine   Comment: hx of heroin abuse (opioid addiction), crack    Social History   Socioeconomic History   Marital status: Married    Spouse name: Not on file   Number of children: Not on file   Years of education: Not on file   Highest education level: Not on file  Occupational History   Not on file  Tobacco Use   Smoking status: Former   Smokeless tobacco: Never  Vaping Use   Vaping Use:  Every day  Substance and Sexual Activity   Alcohol use: No   Drug use: Not Currently    Types: IV, Cocaine    Comment: hx of heroin abuse (opioid addiction), crack   Sexual activity: Not on file  Other Topics Concern   Not on file  Social History Narrative   Working at [employer] in [place].  Married, has 5 step kids (2 at home).  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    Social Determinants of Health   Financial Resource Strain: Not on file  Food Insecurity: Not on file  Transportation Needs: Not on file  Physical Activity: Not on file  Stress: Not on file  Social Connections: Not on file   SDOH:  SDOH Screenings   Alcohol Screen: Not on file  Depression (PHQ2-9): Low Risk    PHQ-2 Score: 3  Financial Resource Strain: Not on file  Food Insecurity: Not on file  Housing: Not on file  Physical Activity: Not on file  Social Connections: Not on file  Stress: Not on file  Tobacco Use: Medium Risk   Smoking Tobacco Use: Former   Smokeless Tobacco Use: Never   Passive Exposure: Not on file  Transportation Needs: Not on file   Additional Social History:    Pain Medications: See MAR Prescriptions: See MAR Over the Counter: See MAR History of alcohol / drug use?: Yes Longest period of sobriety (when/how long): Per chart, "Celebrated a year of sobriety in October 2019, then relapsed and later another six months clean and relapsed." Pt reports, he relasped eight months ago. Negative Consequences of Use: Personal relationships, Work / School Withdrawal Symptoms: None Name of Substance 1: Crack Cocaine. 1 - Age of First Use: Since he was a teenager. 1 - Amount (size/oz): Pt reprots, he uses two grams, daily. 1 - Frequency: Daily. 1 - Duration: Ongoing. 1 - Last Use / Amount: Today. 1 - Method of Aquiring: Purchase. 1- Route of Use: Smoke. Name of Substance 2: Benzodiazepines. 2 - Age of First Use: Since he was a teenager. 2 - Amount (size/oz): UTA 2 - Frequency: Pt reports, he's  not had his medications in about a week. 2 - Duration: Ongoing. 2 - Last Use / Amount: Per pt, 12/09/2021 or 12/11/2021. 2 - Method of Aquiring: Prescribed by PCP. 2 - Route of Substance Use: Oral.                Sleep: Fair  Appetite:  Fair  Current Medications:  Current Facility-Administered Medications  Medication Dose Route Frequency Provider Last Rate Last Admin   acetaminophen (TYLENOL) tablet 650 mg  650 mg Oral Q6H PRN Nira Conn A, NP   650 mg at 12/19/21 1250   alum & mag hydroxide-simeth (MAALOX/MYLANTA) 200-200-20 MG/5ML suspension 30 mL  30 mL Oral Q4H PRN Jackelyn Poling, NP  buPROPion (WELLBUTRIN SR) 12 hr tablet 150 mg  150 mg Oral BID Nira Conn A, NP   150 mg at 12/19/21 0931   diazepam (VALIUM) tablet 5 mg  5 mg Oral Q6H PRN Jackelyn Poling, NP   5 mg at 12/19/21 0641   doxepin (SINEQUAN) capsule 10 mg  10 mg Oral QHS PRN Jackelyn Poling, NP       gabapentin (NEURONTIN) tablet 1,200 mg  1,200 mg Oral BID Nira Conn A, NP   1,200 mg at 12/19/21 0930   hydrOXYzine (ATARAX) tablet 25 mg  25 mg Oral Q6H PRN Jackelyn Poling, NP       lamoTRIgine (LAMICTAL) tablet 25 mg  25 mg Oral Daily Nira Conn A, NP   25 mg at 12/19/21 0930   loperamide (IMODIUM) capsule 2-4 mg  2-4 mg Oral PRN Jackelyn Poling, NP       magnesium hydroxide (MILK OF MAGNESIA) suspension 30 mL  30 mL Oral Daily PRN Jackelyn Poling, NP   30 mL at 12/19/21 0641   multivitamin with minerals tablet 1 tablet  1 tablet Oral Daily Nira Conn A, NP   1 tablet at 12/19/21 8295   ondansetron (ZOFRAN-ODT) disintegrating tablet 4 mg  4 mg Oral Q6H PRN Jackelyn Poling, NP       thiamine tablet 100 mg  100 mg Oral Daily Nira Conn A, NP   100 mg at 12/19/21 6213   Current Outpatient Medications  Medication Sig Dispense Refill   albuterol (VENTOLIN HFA) 108 (90 Base) MCG/ACT inhaler INHALE 1-2 PUFFS BY MOUTH EVERY 6 HOURS AS NEEDED FOR WHEEZE OR SHORTNESS OF BREATH (Patient taking differently: 1-2 puffs  every 6 (six) hours as needed for wheezing or shortness of breath.) 8.5 each 1   buPROPion (WELLBUTRIN XL) 300 MG 24 hr tablet Take 300 mg by mouth daily.     cloNIDine (CATAPRES) 0.1 MG tablet Take 0.1 mg by mouth every 8 (eight) hours as needed (For agitation).     diazepam (VALIUM) 5 MG tablet Take 5 mg by mouth 4 (four) times daily.     doxepin (SINEQUAN) 10 MG/ML solution Take 5 mLs by mouth at bedtime.     gabapentin (NEURONTIN) 600 MG tablet Take 1,200 mg by mouth 2 (two) times daily as needed (For pain).     ibuprofen (ADVIL) 800 MG tablet TAKE 1 TABLET BY MOUTH EVERY 8 HOURS AS NEEDED (Patient taking differently: Take 800 mg by mouth every 8 (eight) hours as needed (For pain).) 90 tablet 0   lamoTRIgine (LAMICTAL) 200 MG tablet Take 1 tablet (200 mg total) by mouth daily.     SUBLOCADE 300 MG/1.5ML SOSY Inject 1.5 mLs into the skin every 28 (twenty-eight) days.      Labs  Lab Results:  Admission on 12/17/2021  Component Date Value Ref Range Status   SARS Coronavirus 2 by RT PCR 12/17/2021 NEGATIVE  NEGATIVE Final   Comment: (NOTE) SARS-CoV-2 target nucleic acids are NOT DETECTED.  The SARS-CoV-2 RNA is generally detectable in upper respiratory specimens during the acute phase of infection. The lowest concentration of SARS-CoV-2 viral copies this assay can detect is 138 copies/mL. A negative result does not preclude SARS-Cov-2 infection and should not be used as the sole basis for treatment or other patient management decisions. A negative result may occur with  improper specimen collection/handling, submission of specimen other than nasopharyngeal swab, presence of viral mutation(s) within the areas targeted by this  assay, and inadequate number of viral copies(<138 copies/mL). A negative result must be combined with clinical observations, patient history, and epidemiological information. The expected result is Negative.  Fact Sheet for Patients:   BloggerCourse.com  Fact Sheet for Healthcare Providers:  SeriousBroker.it  This test is no                          t yet approved or cleared by the [place] FDA and  has been authorized for detection and/or diagnosis of SARS-CoV-2 by FDA under an Emergency Use Authorization (EUA). This EUA will remain  in effect (meaning this test can be used) for the duration of the COVID-19 declaration under Section 564(b)(1) of the Act, 21 U.S.C.section 360bbb-3(b)(1), unless the authorization is terminated  or revoked sooner.       Influenza A by PCR 12/17/2021 NEGATIVE  NEGATIVE Final   Influenza B by PCR 12/17/2021 NEGATIVE  NEGATIVE Final   Comment: (NOTE) The Xpert Xpress SARS-CoV-2/FLU/RSV plus assay is intended as an aid in the diagnosis of influenza from Nasopharyngeal swab specimens and should not be used as a sole basis for treatment. Nasal washings and aspirates are unacceptable for Xpert Xpress SARS-CoV-2/FLU/RSV testing.  Fact Sheet for Patients: BloggerCourse.com  Fact Sheet for Healthcare Providers: SeriousBroker.it  This test is not yet approved or cleared by the [place] FDA and has been authorized for detection and/or diagnosis of SARS-CoV-2 by FDA under an Emergency Use Authorization (EUA). This EUA will remain in effect (meaning this test can be used) for the duration of the COVID-19 declaration under Section 564(b)(1) of the Act, 21 U.S.C. section 360bbb-3(b)(1), unless the authorization is terminated or revoked.  Performed at [facility], [address] [place], [place] [address]    SARS Coronavirus 2 Ag 12/17/2021 Negative  Negative Preliminary   WBC 12/17/2021 4.9  4.0 - 10.5 K/uL Final   RBC 12/17/2021 4.63  4.22 - 5.81 MIL/uL Final   Hemoglobin 12/17/2021 13.9  13.0 - 17.0 g/dL Final   HCT 09/32/3557 43.2  39.0 - 52.0 % Final   MCV  12/17/2021 93.3  80.0 - 100.0 fL Final   MCH 12/17/2021 30.0  26.0 - 34.0 pg Final   MCHC 12/17/2021 32.2  30.0 - 36.0 g/dL Final   RDW 32/20/2542 13.2  11.5 - 15.5 % Final   Platelets 12/17/2021 282  150 - 400 K/uL Final   nRBC 12/17/2021 0.0  0.0 - 0.2 % Final   Neutrophils Relative % 12/17/2021 42  % Final   Neutro Abs 12/17/2021 2.1  1.7 - 7.7 K/uL Final   Lymphocytes Relative 12/17/2021 37  % Final   Lymphs Abs 12/17/2021 1.8  0.7 - 4.0 K/uL Final   Monocytes Relative 12/17/2021 14  % Final   Monocytes Absolute 12/17/2021 0.7  0.1 - 1.0 K/uL Final   Eosinophils Relative 12/17/2021 6  % Final   Eosinophils Absolute 12/17/2021 0.3  0.0 - 0.5 K/uL Final   Basophils Relative 12/17/2021 1  % Final   Basophils Absolute 12/17/2021 0.0  0.0 - 0.1 K/uL Final   Immature Granulocytes 12/17/2021 0  % Final   Abs Immature Granulocytes 12/17/2021 0.00  0.00 - 0.07 K/uL Final   Performed at [facility], [address] [place], [place] [address]   Sodium 12/17/2021 139  135 - 145 mmol/L Final   Potassium 12/17/2021 4.2  3.5 - 5.1 mmol/L Final   Chloride 12/17/2021 101  98 - 111 mmol/L Final   CO2 12/17/2021 30  22 - 32 mmol/L Final   Glucose, Bld 12/17/2021 111 (H)  70 - 99 mg/dL Final   Glucose reference range applies only to samples taken after fasting for at least 8 hours.   BUN 12/17/2021 12  6 - 20 mg/dL Final   Creatinine, Ser 12/17/2021 0.96  0.61 - 1.24 mg/dL Final   Calcium 56/21/3086 9.6  8.9 - 10.3 mg/dL Final   Total Protein 57/84/6962 6.7  6.5 - 8.1 g/dL Final   Albumin 95/28/4132 4.0  3.5 - 5.0 g/dL Final   AST 44/10/270 34  15 - 41 U/L Final   ALT 12/17/2021 34  0 - 44 U/L Final   Alkaline Phosphatase 12/17/2021 75  38 - 126 U/L Final   Total Bilirubin 12/17/2021 0.5  0.3 - 1.2 mg/dL Final   GFR, Estimated 12/17/2021 >60  >60 mL/min Final   Comment: (NOTE) Calculated using the CKD-EPI Creatinine Equation (2021)    Anion gap 12/17/2021 8  5 - 15 Final   Performed at  [facility], [address], [place], [place] [address]   Hgb A1c MFr Bld 12/17/2021 5.9 (H)  4.8 - 5.6 % Final   Comment: (NOTE)         Prediabetes: 5.7 - 6.4         Diabetes: >6.4         Glycemic control for adults with diabetes: <7.0    Mean Plasma Glucose 12/17/2021 123  mg/dL Final   Comment: (NOTE) Performed At: [facility] [address] [place], [place] [id number] Jolene Schimke MD DG:[id number]    Alcohol, Ethyl (B) 12/17/2021 <10  <10 mg/dL Final   Comment: (NOTE) Lowest detectable limit for serum alcohol is 10 mg/dL.  For medical purposes only. Performed at [facility], [address] [place], [place] [address]    TSH 12/17/2021 0.686  0.350 - 4.500 uIU/mL Final   Comment: Performed by a 3rd Generation assay with a functional sensitivity of <=0.01 uIU/mL. Performed at [facility], [address], [place], [place] [address]    POC Amphetamine UR 12/18/2021 None Detected  NONE DETECTED (Cut Off Level 1000 ng/mL) Final   POC Secobarbital (BAR) 12/18/2021 None Detected  NONE DETECTED (Cut Off Level 300 ng/mL) Final   POC Buprenorphine (BUP) 12/18/2021 Positive (A)  NONE DETECTED (Cut Off Level 10 ng/mL) Final   POC Oxazepam (BZO) 12/18/2021 Positive (A)  NONE DETECTED (Cut Off Level 300 ng/mL) Final   POC Cocaine UR 12/18/2021 Positive (A)  NONE DETECTED (Cut Off Level 300 ng/mL) Final   POC Methamphetamine UR 12/18/2021 None Detected  NONE DETECTED (Cut Off Level 1000 ng/mL) Final   POC Morphine 12/18/2021 None Detected  NONE DETECTED (Cut Off Level 300 ng/mL) Final   POC Oxycodone UR 12/18/2021 None Detected  NONE DETECTED (Cut Off Level 100 ng/mL) Final   POC Methadone UR 12/18/2021 None Detected  NONE DETECTED (Cut Off Level 300 ng/mL) Final   POC Marijuana UR 12/18/2021 Positive (A)  NONE DETECTED (Cut Off Level 50 ng/mL) Final   Cholesterol 12/17/2021 175  0 - 200 mg/dL Final   Triglycerides 30/16/0109 127  <150 mg/dL Final   HDL  32/35/5732 50  >40 mg/dL Final   Total CHOL/HDL Ratio 12/17/2021 3.5  RATIO Final   VLDL 12/17/2021 25  0 - 40 mg/dL Final   LDL Cholesterol 12/17/2021 100 (H)  0 - 99  mg/dL Final   Comment:        Total Cholesterol/HDL:CHD Risk Coronary Heart Disease Risk Table                     Men   Women  1/2 Average Risk   3.4   3.3  Average Risk       5.0   4.4  2 X Average Risk   9.6   7.1  3 X Average Risk  23.4   11.0        Use the calculated Patient Ratio above and the CHD Risk Table to determine the patient's CHD Risk.        ATP III CLASSIFICATION (LDL):  <100     mg/dL   Optimal  027-253  mg/dL   Near or Above                    Optimal  130-159  mg/dL   Borderline  664-403  mg/dL   High  >474     mg/dL   Very High Performed at [facility], [address] [place], [place] [address]   Admission on 12/14/2021, Discharged on 12/15/2021  Component Date Value Ref Range Status   SARS Coronavirus 2 by RT PCR 12/14/2021 NEGATIVE  NEGATIVE Final   Comment: (NOTE) SARS-CoV-2 target nucleic acids are NOT DETECTED.  The SARS-CoV-2 RNA is generally detectable in upper respiratory specimens during the acute phase of infection. The lowest concentration of SARS-CoV-2 viral copies this assay can detect is 138 copies/mL. A negative result does not preclude SARS-Cov-2 infection and should not be used as the sole basis for treatment or other patient management decisions. A negative result may occur with  improper specimen collection/handling, submission of specimen other than nasopharyngeal swab, presence of viral mutation(s) within the areas targeted by this assay, and inadequate number of viral copies(<138 copies/mL). A negative result must be combined with clinical observations, patient history, and epidemiological information. The expected result is Negative.  Fact Sheet for Patients:  BloggerCourse.com  Fact Sheet for Healthcare Providers:   SeriousBroker.it  This test is no                          t yet approved or cleared by the [place] FDA and  has been authorized for detection and/or diagnosis of SARS-CoV-2 by FDA under an Emergency Use Authorization (EUA). This EUA will remain  in effect (meaning this test can be used) for the duration of the COVID-19 declaration under Section 564(b)(1) of the Act, 21 U.S.C.section 360bbb-3(b)(1), unless the authorization is terminated  or revoked sooner.       Influenza A by PCR 12/14/2021 NEGATIVE  NEGATIVE Final   Influenza B by PCR 12/14/2021 NEGATIVE  NEGATIVE Final   Comment: (NOTE) The Xpert Xpress SARS-CoV-2/FLU/RSV plus assay is intended as an aid in the diagnosis of influenza from Nasopharyngeal swab specimens and should not be used as a sole basis for treatment. Nasal washings and aspirates are unacceptable for Xpert Xpress SARS-CoV-2/FLU/RSV testing.  Fact Sheet for Patients: BloggerCourse.com  Fact Sheet for Healthcare Providers: SeriousBroker.it  This test is not yet approved or cleared by the [place] FDA and has been authorized for detection and/or diagnosis of SARS-CoV-2 by FDA under an Emergency Use Authorization (EUA). This EUA will remain in effect (meaning this test can be used) for the duration of the COVID-19 declaration under Section 564(b)(1)  of the Act, 21 U.S.C. section 360bbb-3(b)(1), unless the authorization is terminated or revoked.  Performed at [facility], [address], [place], [place] [address]    Sodium 12/14/2021 141  135 - 145 mmol/L Final   Potassium 12/14/2021 3.5  3.5 - 5.1 mmol/L Final   Chloride 12/14/2021 103  98 - 111 mmol/L Final   CO2 12/14/2021 29  22 - 32 mmol/L Final   Glucose, Bld 12/14/2021 105 (H)  70 - 99 mg/dL Final   Glucose reference range applies only to samples taken after fasting for at least 8 hours.   BUN  12/14/2021 7  6 - 20 mg/dL Final   Creatinine, Ser 12/14/2021 1.13  0.61 - 1.24 mg/dL Final   Calcium 13/24/4010 9.0  8.9 - 10.3 mg/dL Final   Total Protein 27/25/3664 6.7  6.5 - 8.1 g/dL Final   Albumin 40/34/7425 4.1  3.5 - 5.0 g/dL Final   AST 95/63/8756 47 (H)  15 - 41 U/L Final   ALT 12/14/2021 43  0 - 44 U/L Final   Alkaline Phosphatase 12/14/2021 69  38 - 126 U/L Final   Total Bilirubin 12/14/2021 0.6  0.3 - 1.2 mg/dL Final   GFR, Estimated 12/14/2021 >60  >60 mL/min Final   Comment: (NOTE) Calculated using the CKD-EPI Creatinine Equation (2021)    Anion gap 12/14/2021 9  5 - 15 Final   Performed at [facility], [address], [place], [place] [address]   Alcohol, Ethyl (B) 12/14/2021 <10  <10 mg/dL Final   Comment: (NOTE) Lowest detectable limit for serum alcohol is 10 mg/dL.  For medical purposes only. Performed at [facility], [address], [place], [place] [address]    WBC 12/14/2021 6.3  4.0 - 10.5 K/uL Final   RBC 12/14/2021 4.71  4.22 - 5.81 MIL/uL Final   Hemoglobin 12/14/2021 14.0  13.0 - 17.0 g/dL Final   HCT 16/60/6301 43.8  39.0 - 52.0 % Final   MCV 12/14/2021 93.0  80.0 - 100.0 fL Final   MCH 12/14/2021 29.7  26.0 - 34.0 pg Final   MCHC 12/14/2021 32.0  30.0 - 36.0 g/dL Final   RDW 60/07/9322 12.9  11.5 - 15.5 % Final   Platelets 12/14/2021 254  150 - 400 K/uL Final   nRBC 12/14/2021 0.0  0.0 - 0.2 % Final   Neutrophils Relative % 12/14/2021 65  % Final   Neutro Abs 12/14/2021 4.2  1.7 - 7.7 K/uL Final   Lymphocytes Relative 12/14/2021 21  % Final   Lymphs Abs 12/14/2021 1.3  0.7 - 4.0 K/uL Final   Monocytes Relative 12/14/2021 10  % Final   Monocytes Absolute 12/14/2021 0.7  0.1 - 1.0 K/uL Final   Eosinophils Relative 12/14/2021 3  % Final   Eosinophils Absolute 12/14/2021 0.2  0.0 - 0.5 K/uL Final   Basophils Relative 12/14/2021 1  % Final   Basophils Absolute 12/14/2021 0.0  0.0 - 0.1 K/uL Final   Immature Granulocytes 12/14/2021 0  %  Final   Abs Immature Granulocytes 12/14/2021 0.01  0.00 - 0.07 K/uL Final   Performed at [facility], [address], [place], [place] [address]   Acetaminophen (Tylenol), Serum 12/14/2021 <10 (L)  10 - 30 ug/mL Final   Comment: (NOTE) Therapeutic concentrations vary significantly. A range of 10-30 ug/mL  may be an effective concentration for many patients. However, some  are best treated at concentrations outside of this  range. Acetaminophen concentrations >150 ug/mL at 4 hours after ingestion  and >50 ug/mL at 12 hours after ingestion are often associated with  toxic reactions.  Performed at [facility], [address], [place], [place] [address]    Salicylate Lvl 12/14/2021 <7.0 (L)  7.0 - 30.0 mg/dL Final   Performed at [facility], [address] [place], [place] [address]  Admission on 09/19/2021, Discharged on 09/19/2021  Component Date Value Ref Range Status   Sodium 09/19/2021 139  135 - 145 mmol/L Final   Potassium 09/19/2021 4.3  3.5 - 5.1 mmol/L Final   Chloride 09/19/2021 106  98 - 111 mmol/L Final   CO2 09/19/2021 27  22 - 32 mmol/L Final   Glucose, Bld 09/19/2021 88  70 - 99 mg/dL Final   Glucose reference range applies only to samples taken after fasting for at least 8 hours.   BUN 09/19/2021 17  6 - 20 mg/dL Final   Creatinine, Ser 09/19/2021 1.01  0.61 - 1.24 mg/dL Final   Calcium 09/81/1914 9.2  8.9 - 10.3 mg/dL Final   Total Protein 78/29/5621 7.1  6.5 - 8.1 g/dL Final   Albumin 30/86/5784 4.1  3.5 - 5.0 g/dL Final   AST 69/62/9528 34  15 - 41 U/L Final   ALT 09/19/2021 27  0 - 44 U/L Final   Alkaline Phosphatase 09/19/2021 72  38 - 126 U/L Final   Total Bilirubin 09/19/2021 0.8  0.3 - 1.2 mg/dL Final   GFR, Estimated 09/19/2021 >60  >60 mL/min Final   Comment: (NOTE) Calculated using the CKD-EPI Creatinine Equation (2021)    Anion gap 09/19/2021 6  5 - 15 Final   Performed at [facility], [address] [place], [place]  [address]   WBC 09/19/2021 4.5  4.0 - 10.5 K/uL Final   RBC 09/19/2021 4.70  4.22 - 5.81 MIL/uL Final   Hemoglobin 09/19/2021 14.2  13.0 - 17.0 g/dL Final   HCT 40/07/2724 42.9  39.0 - 52.0 % Final   MCV 09/19/2021 91.3  80.0 - 100.0 fL Final   MCH 09/19/2021 30.2  26.0 - 34.0 pg Final   MCHC 09/19/2021 33.1  30.0 - 36.0 g/dL Final   RDW 36/64/4034 13.4  11.5 - 15.5 % Final   Platelets 09/19/2021 262  150 - 400 K/uL Final   nRBC 09/19/2021 0.0  0.0 - 0.2 % Final   Performed at [facility], [address] [place], [place] [address]   Color, Urine 09/19/2021 YELLOW  YELLOW Final   YELLOW   APPearance 09/19/2021 CLEAR  CLEAR Final   CLEAR   Specific Gravity, Urine 09/19/2021 1.020  1.005 - 1.030 Final   pH 09/19/2021 5.5  5.0 - 8.0 Final   Glucose, UA 09/19/2021 NEGATIVE  NEGATIVE mg/dL Final   Hgb urine dipstick 09/19/2021 NEGATIVE  NEGATIVE Final   Bilirubin Urine 09/19/2021 NEGATIVE  NEGATIVE Final   Ketones, ur 09/19/2021 NEGATIVE  NEGATIVE mg/dL Final   Protein, ur 56/38/7564 NEGATIVE  NEGATIVE mg/dL Final   Nitrite 33/29/5188 NEGATIVE  NEGATIVE Final   Leukocytes,Ua 09/19/2021 NEGATIVE  NEGATIVE Final   Comment: Microscopic not done on urines with negative protein, blood, leukocytes, nitrite, or glucose < 500 mg/dL. Performed at [facility], [address], [place], [place] [address]   Admission on 07/02/2021, Discharged on 07/02/2021  Component Date Value Ref Range Status   SARS Coronavirus 2 by RT PCR 07/02/2021 NEGATIVE  NEGATIVE Final  Comment: (NOTE) SARS-CoV-2 target nucleic acids are NOT DETECTED.  The SARS-CoV-2 RNA is generally detectable in upper respiratory specimens during the acute phase of infection. The lowest concentration of SARS-CoV-2 viral copies this assay can detect is 138 copies/mL. A negative result does not preclude SARS-Cov-2 infection and should not be used as the sole basis for treatment or other patient management decisions. A  negative result may occur with  improper specimen collection/handling, submission of specimen other than nasopharyngeal swab, presence of viral mutation(s) within the areas targeted by this assay, and inadequate number of viral copies(<138 copies/mL). A negative result must be combined with clinical observations, patient history, and epidemiological information. The expected result is Negative.  Fact Sheet for Patients:  BloggerCourse.com  Fact Sheet for Healthcare Providers:  SeriousBroker.it  This test is no                          t yet approved or cleared by the [place] FDA and  has been authorized for detection and/or diagnosis of SARS-CoV-2 by FDA under an Emergency Use Authorization (EUA). This EUA will remain  in effect (meaning this test can be used) for the duration of the COVID-19 declaration under Section 564(b)(1) of the Act, 21 U.S.C.section 360bbb-3(b)(1), unless the authorization is terminated  or revoked sooner.       Influenza A by PCR 07/02/2021 NEGATIVE  NEGATIVE Final   Influenza B by PCR 07/02/2021 NEGATIVE  NEGATIVE Final   Comment: (NOTE) The Xpert Xpress SARS-CoV-2/FLU/RSV plus assay is intended as an aid in the diagnosis of influenza from Nasopharyngeal swab specimens and should not be used as a sole basis for treatment. Nasal washings and aspirates are unacceptable for Xpert Xpress SARS-CoV-2/FLU/RSV testing.  Fact Sheet for Patients: BloggerCourse.com  Fact Sheet for Healthcare Providers: SeriousBroker.it  This test is not yet approved or cleared by the [place] FDA and has been authorized for detection and/or diagnosis of SARS-CoV-2 by FDA under an Emergency Use Authorization (EUA). This EUA will remain in effect (meaning this test can be used) for the duration of the COVID-19 declaration under Section 564(b)(1) of the Act, 21  U.S.C. section 360bbb-3(b)(1), unless the authorization is terminated or revoked.  Performed at [facility], [address] [place], [place] [address]     Blood Alcohol level:  Lab Results  Component Value Date   [facility] <10 12/17/2021   ETH <10 12/14/2021    Metabolic Disorder Labs: Lab Results  Component Value Date   HGBA1C 5.9 (H) 12/17/2021   MPG 123 12/17/2021   MPG 119.76 12/03/2020   Lab Results  Component Value Date   PROLACTIN 8.9 01/04/2021   PROLACTIN 16.2 (H) 12/03/2020   Lab Results  Component Value Date   CHOL 175 12/17/2021   TRIG 127 12/17/2021   HDL 50 12/17/2021   CHOLHDL 3.5 12/17/2021   VLDL 25 12/17/2021   LDLCALC 100 (H) 12/17/2021   LDLCALC 104 (H) 12/03/2020    Therapeutic Lab Levels: No results found for: LITHIUM No results found for: VALPROATE No components found for:  CBMZ  Physical Findings   AUDIT    Flowsheet Row Admission (Discharged) from 07/14/2019 in [facility]PAT[facility]INE  Alcohol Use Disorder Identification Test Final Score (AUDIT) 21      PHQ2-9    Flowsheet Row Office Visit from 08/03/2021 in [facility] from 12/03/2020 in [facility]  PHQ-2 Total Score  0 4  PHQ-9 Total Score 3 18      Flowsheet Row ED from 12/17/2021 in [facility] ED from 12/14/2021 in [facility] EMERGENCY DEPARTMENT ED from 12/11/2021 in [facility]  C-SSRS RISK CATEGORY Error: Q3, 4, or 5 should not be populated when Q2 is No No Risk No Risk        Musculoskeletal  Strength & Muscle Tone: within normal limits Gait & Station: normal Patient leans: N/A  Psychiatric Specialty Exam  Presentation  General Appearance: Appropriate for Environment; Casual  Eye Contact:Fair  Speech:Clear and Coherent; Normal Rate  Speech Volume:Normal  Handedness:Right   Mood and Affect   Mood:Dysphoric  Affect:Appropriate; Congruent (dysphoric)   Thought Process  Thought Processes:Coherent; Goal Directed; Linear  Descriptions of Associations:Intact  Orientation:Full (Time, Place and Person)  Thought Content:WDL; Logical  Diagnosis of Schizophrenia or Schizoaffective disorder in past: No  Duration of Psychotic Symptoms: Less than six months   Hallucinations:Hallucinations: None  Ideas of Reference:None  Suicidal Thoughts:Suicidal Thoughts: No  Homicidal Thoughts:Homicidal Thoughts: No   Sensorium  Memory:Immediate Good; Recent Fair; Remote Fair  Judgment:Fair  Insight:Fair   Executive Functions  Concentration:Fair  Attention Span:Fair  Recall:Fair  Fund of Knowledge:Good  Language:Good   Psychomotor Activity  Psychomotor Activity:Psychomotor Activity: Normal   Assets  Assets:Desire for Improvement; Financial Resources/Insurance; Physical Health; Resilience; Social Support   Sleep  Sleep:Sleep: Fair Number of Hours of Sleep: 5   No data recorded  Physical Exam  Physical Exam Constitutional:      Appearance: Normal appearance. He is normal weight.  HENT:     Head: Normocephalic and atraumatic.  Eyes:     Extraocular Movements: Extraocular movements intact.  Pulmonary:     Effort: Pulmonary effort is normal.  Neurological:     General: No focal deficit present.     Mental Status: [name] to person, place, and time.  Psychiatric:        Attention and Perception: Attention and perception normal.        Speech: Speech normal.        Behavior: Behavior normal. Behavior is cooperative.        Thought Content: Thought content normal.   Review of Systems  Constitutional:  Negative for chills and fever.  HENT:  Negative for hearing loss.   Eyes:  Negative for discharge and redness.  Respiratory:  Negative for cough.   Cardiovascular:  Negative for chest pain.  Gastrointestinal:  Negative for abdominal pain.   Musculoskeletal:  Negative for myalgias.  Neurological:  Negative for headaches.  Blood pressure 131/80, pulse 62, temperature (!) 97.5 F (36.4 C), resp. rate 18, SpO2 100 %. There is no height or weight on file to calculate BMI.  Treatment Plan Summary: 41 yo male with history of polysubstance abuse, MDD, SIMD/SIPD who presented tot he [facility] on 12/17/21 under IVC for psychosis. . Patient's psychosis cleared and IVC was discontinued; he reported desire for assistance with substance use and he was transferred to the [facility]. UDS+buprenorphine, benzos, cocaine. Etoh negative.  Patient denies SI/HI/AVH. Patient is not interested in substance use treatment at this time. He is not appropriate for sober living as he declines to discontinue valium that he is prescribed outpatient. states that his wife is looking for alternate housing options. Patient reports difficulty with urination- patient is only able to describe this difficulty as not urinating as much as he normally does-last urinated yesterday.  No dysuria, blood in the urine, pain. VSS; labs reviewed- wnl. UA ordered.  On interview and per documentation, patient has been drowsy for majority of stay in [facility]. CIWA scores consistently low (0 and 2) apart from one reading of 10. Recommend to monitor CIWA closely and to administer valium only if deemed appropriate by score to avoid oversedation. On review of PMP, strong suspicion/concern that patient is misusing/abusing controlled medications. Over the last 6 months he has had 10 prescribers for opioids or sedative medications.  Continue home medications at this time and have valium available PRN. Discussed with patient this will not be prescribed at discharge  Anticipate discharge tomorrow    Estella Husk, MD 12/19/2021 4:21 PM

## 2021-12-19 NOTE — ED Notes (Signed)
Gave Pt. A snack 

## 2021-12-19 NOTE — ED Notes (Signed)
Pt is currently sleeping, no distress noted, environmental check complete, will continue to monitor patient for safety. ? ?

## 2021-12-19 NOTE — ED Notes (Signed)
Pt ate breakfast this morning and returned to his room after.  Reports he has been unable to have a BM for 4 days.  PRN medication was given.  Pt also reports difficulty urinating but when asked if he feels like he needs to go pt states "no, I just haven't peed in a while."  Encouraged pt to drink fluids to assist with these concerns.  Reports he slept so/ so over the night and has generalized body pain 6/10.  PRN medication provided to aid in pain management.  When asked about his mood pt reports hopeless.  He says he is worried about what he did to his body when he engaged in drug use.  Breathing is even and unlabored.  Will continue to monitor for safety. ?

## 2021-12-19 NOTE — ED Notes (Signed)
Pt carried urine cup and encouraged to provide sample.  Pt does not need to use the restroom at the moment but states "I will give it a try." ?

## 2021-12-19 NOTE — ED Notes (Signed)
Did not want to do AA tonight. ?

## 2021-12-19 NOTE — Clinical Social Work Psych Note (Signed)
LCSW Initial Note ? ?LCSW met with Randall Parsons for introduction and to begin discussions regarding treatment and potential discharge planning.  ? ?Randall Parsons presented with a dysphoric affect, depressed mood. He appeared to be in discomfort, however was cooperative with this Probation officer. Randall Parsons denied having any SI, HI or AVH at this time, however he did endorse experiencing body aches. He also expressed concerned about incontinence.  ? ?Randall Parsons shared that he came to the [facility] seeking assistance for crisis stabilization and medication stabilization. Per was initially brought to the [facility] under IVC (petitioned by his wife, Randall Parsons) for concerns regarding psychosis and mental health issues. Per collateral obtained by initial provider encounter, the patient's wife is mainly concerned about his substance abuse issues.  ? ?Randall Parsons endorsed smoking 2 grams of crack cocaine on a daily basis for several weeks. His UDS was positive for benzodiazepines (prescribed medications) and Cannabis.He reports he has been to several "rehabs and detox places" and he did not find their services very beneficial.  ? ?Randall Parsons has been staying in different hotels, as he is not allowed back to his family's home, until he receives help and becomes sober. Randall Parsons reports he is currently unemployed, however he reports having a "nice amount" of money that he can "survive on".  ? ?Randall Parsons reports he is unsure what his discharge plans are at this time. Randall Parsons denied any residential treatment services or referrals at this time.  ? ?Randall Parsons reports "I feel scrambled and I just need to get stable".  ? ? ?LCSW will continue to follow  ? ?Randall Parsons, MSW, LCSW ?Clinical Education officer, museum Insurance claims handler) ?[facility]  ?  ?

## 2021-12-19 NOTE — ED Notes (Signed)
Pt requested clothing from locker.  Clothes were taken from locker 21 and given to pt after being checked to ensure they were appropriate for the unit.  Pt also stated his desire to return home tomorrow.  He says his wife has secured a place for him to stay for a while. Will continue to monitor for safety. ?

## 2021-12-19 NOTE — ED Notes (Signed)
Formatting of this note might be different from the original.  Pt is currently sleeping, no distress noted, environmental check complete, will continue to monitor patient for safety.    Electronically signed by Ashok Norris, RN at 12/19/2021  4:51 AM EDT

## 2021-12-19 NOTE — ED Notes (Signed)
Formatting of this note might be different from the original.  Pt carried urine cup and encouraged to provide sample.  Pt does not need to use the restroom at the moment but states "I will give it a try."  Electronically signed by Rico Sheehan, LPN at 81/19/1478  2:56 PM EDT

## 2021-12-19 NOTE — ED Notes (Signed)
Formatting of this note might be different from the original.  Pt is currently sleeping, no distress noted, environmental check complete, will continue to monitor patient for safety.    Electronically signed by Ashok Norris, RN at 12/19/2021 11:01 PM EDT

## 2021-12-19 NOTE — Unmapped (Signed)
Formatting of this note might be different from the original.  LCSW Initial Note    LCSW met with Lucas Hicks for introduction and to begin discussions regarding treatment and potential discharge planning.     Lucas Hicks presented with a dysphoric affect, depressed mood. He appeared to be in discomfort, however was cooperative with this Clinical research associate. Lucas Hicks denied having any SI, HI or AVH at this time, however he did endorse experiencing body aches. He also expressed concerned about incontinence.     Lucas Hicks shared that he came to the [facility] seeking assistance for crisis stabilization and medication stabilization. Lucas Hicks was initially brought to the [facility] under IVC (petitioned by his wife, Lucas Hicks) for concerns regarding psychosis and mental health issues. Per collateral obtained by initial provider encounter, the patient's wife is mainly concerned about his substance abuse issues.     Lucas Hicks endorsed smoking 2 grams of crack cocaine on a daily basis for several weeks. His UDS was positive for benzodiazepines (prescribed medications) and Cannabis.He reports he has been to several "rehabs and detox places" and he did not find their services very beneficial.     Lucas Hicks has been staying in different hotels, as he is not allowed back to his family's home, until he receives help and becomes sober. Lucas Hicks reports he is currently unemployed, however he reports having a "nice amount" of money that he can "survive on".     Lucas Hicks reports he is unsure what his discharge plans are at this time. Lucas Hicks denied any residential treatment services or referrals at this time.     Lucas Hicks reports "I feel scrambled and I just need to get stable".     LCSW will continue to follow     Lucas Hicks, MSW, LCSW  Clinical Social Worker (Facility Based Crisis)  [facility]     Electronically signed by Maeola Sarah, LCSW at 12/19/2021 11:50 AM EDT

## 2021-12-19 NOTE — ED Notes (Signed)
Formatting of this note might be different from the original.  Gave Pt. A snack.  Electronically signed by Rae Lips B at 12/19/2021  9:28 PM EDT

## 2021-12-19 NOTE — ED Notes (Signed)
Formatting of this note might be different from the original.  Pt requested clothing from locker.  Clothes were taken from locker 21 and given to pt after being checked to ensure they were appropriate for the unit.  Pt also stated his desire to return home tomorrow.  He says his wife has secured a place for him to stay for a while. Will continue to monitor for safety.  Electronically signed by Rico Sheehan, LPN at 16/07/9603  5:19 PM EDT

## 2021-12-19 NOTE — ED Notes (Signed)
Formatting of this note might be different from the original.  Pt ate breakfast this morning and returned to his room after.  Reports he has been unable to have a BM for 4 days.  PRN medication was given.  Pt also reports difficulty urinating but when asked if he feels like he needs to go pt states "no, I just haven't peed in a while."  Encouraged pt to drink fluids to assist with these concerns.  Reports he slept so/ so over the night and has generalized body pain 6/10.  PRN medication provided to aid in pain management.  When asked about his mood pt reports hopeless.  He says he is worried about what he did to his body when he engaged in drug use.  Breathing is even and unlabored.  Will continue to monitor for safety.  Electronically signed by Rico Sheehan, LPN at 16/07/9603 10:16 AM EDT

## 2021-12-19 NOTE — ED Provider Notes (Signed)
Formatting of this note is different from the original.  Behavioral Health Progress Note    Date and Time: 12/19/2021 4:21 PM  Name: Lucas Hicks  MRN:  [id number]    Subjective:    41 yo male with history of polysubstance abuse, MDD, SIMD/SIPD who presented tot he [facility] on 12/17/21 under IVC for psychosis. . Patient's psychosis cleared and IVC was discontinued; he reported desire for assistance with substance use and he was transferred to the [facility]. UDS+buprenorphine, benzos, cocaine. Etoh negative.    Patient seen and chart reviewed. He has been medication compliant and has been appropriate with staff and peers on the unit. On interview today, patient is found laying in bed in NAD. Patient states that he is "struggling" and states that he has been constipated for a couple days and has not urinated since yesterday. On further discussion, patient reports that he had a small BM this morning. Patient denies all other urinary sx including dysuria, pain, blood in the urine and is only able to report that he normally urinates more than he does currently. Discussed with patient that a UA and STD testing can be ordered as these can often present with urinary sx- patient declines STD testing reporting that is not an issue but is agreeable to UA.  Patient denies SI/HI/AVH. He states that his wife is looking for a room for him to rent as she does not want him to return home. Patient states that he has 2 step children aged 102 and 8 who also reside at the home. Patient states that he is not interested in substance use treatment at this time but does express interested in sober living. Patient is currently prescribed valium- discussed with patient that a sober living house will take accept people on controlled substances. Patient verbalized understanding and stated that he is not interested in stopping valium because "its the only thing that works". He does on to state that he has tried "every antidepressant" and that they were all  unhelpful. He reports multiple hospitalizations for "behavior" in the past. He denies alcohol use. He states that he has had numerous legal charges and states that he has a court date in April. Declines to provide further details. Discussed with patient that valium is ordered based on CIWA score and that it is not scheduled-patient verbalized undersanding.    Diagnosis:   Final diagnoses:   Cocaine abuse with cocaine-induced mood disorder (HCC)   MDD (major depressive disorder), recurrent episode, moderate (HCC)   Benzodiazepine withdrawal, uncomplicated (HCC)     Total Time spent with patient: 20 minutes    Past Psychiatric History: mdd, polysubstance abuse  Past Medical History:   Past Medical History:   Diagnosis Date    CHF (congestive heart failure) (HCC)     Exposure to hepatitis B     Exposure to hepatitis C     Hepatitis C     History of opioid abuse (HCC)     reports heroin abuse, ending around 2017    Hypertension     IV drug abuse (HCC)     Renal disorder     kidney stones     Past Surgical History:   Procedure Laterality Date    APPENDECTOMY      EYE SURGERY      secondary to dog bite    TIBIA FRACTURE SURGERY Right      Family History:   Family History   Problem Relation Age of Onset  Alcohol abuse Father     Melanoma Mother     Breast cancer Mother     Colon cancer Neg Hx     Prostate cancer Neg Hx      Family Psychiatric  History: substance use and bipolar disorder  Social History:   Social History     Substance and Sexual Activity   Alcohol Use No     Social History     Substance and Sexual Activity   Drug Use Not Currently    Types: IV, Cocaine    Comment: hx of heroin abuse (opioid addiction), crack     Social History     Socioeconomic History    Marital status: Married     Spouse name: Not on file    Number of children: Not on file    Years of education: Not on file    Highest education level: Not on file   Occupational History    Not on file   Tobacco Use    Smoking status: Former    Smokeless  tobacco: Never   Vaping Use    Vaping Use: Every day   Substance and Sexual Activity    Alcohol use: No    Drug use: Not Currently     Types: IV, Cocaine     Comment: hx of heroin abuse (opioid addiction), crack    Sexual activity: Not on file   Other Topics Concern    Not on file   Social History Narrative    Working at [employer] in [place].  Married, has 5 step kids (2 at home).  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      Social Determinants of Health     Financial Resource Strain: Not on file   Food Insecurity: Not on file   Transportation Needs: Not on file   Physical Activity: Not on file   Stress: Not on file   Social Connections: Not on file     SDOH:   SDOH Screenings     Alcohol Screen: Not on file   Depression (PHQ2-9): Low Risk     PHQ-2 Score: 3   Financial Resource Strain: Not on file   Food Insecurity: Not on file   Housing: Not on file   Physical Activity: Not on file   Social Connections: Not on file   Stress: Not on file   Tobacco Use: Medium Risk    Smoking Tobacco Use: Former    Smokeless Tobacco Use: Never    Passive Exposure: Not on file   Transportation Needs: Not on file     Additional Social History:     Pain Medications: See MAR  Prescriptions: See MAR  Over the Counter: See MAR  History of alcohol / drug use?: Yes  Longest period of sobriety (when/how long): Per chart, "Celebrated a year of sobriety in October 2019, then relapsed and later another six months clean and relapsed." Pt reports, he relasped eight months ago.  Negative Consequences of Use: Personal relationships, Work / School  Withdrawal Symptoms: None  Name of Substance 1: Crack Cocaine.  1 - Age of First Use: Since he was a teenager.  1 - Amount (size/oz): Pt reprots, he uses two grams, daily.  1 - Frequency: Daily.  1 - Duration: Ongoing.  1 - Last Use / Amount: Today.  1 - Method of Aquiring: Purchase.  1- Route of Use: Smoke.  Name of Substance 2: Benzodiazepines.  2 - Age of First Use: Since he was a  teenager.  2 - Amount  (size/oz): UTA  2 - Frequency: Pt reports, he's not had his medications in about a week.  2 - Duration: Ongoing.  2 - Last Use / Amount: Per pt, 12/09/2021 or 12/11/2021.  2 - Method of Aquiring: Prescribed by PCP.  2 - Route of Substance Use: Oral.                  Sleep: Fair    Appetite:  Fair    Current Medications:   Current Facility-Administered Medications   Medication Dose Route Frequency Provider Last Rate Last Admin    acetaminophen (TYLENOL) tablet 650 mg  650 mg Oral Q6H PRN Jackelyn Poling, NP   650 mg at 12/19/21 1250    alum & mag hydroxide-simeth (MAALOX/MYLANTA) 200-200-20 MG/5ML suspension 30 mL  30 mL Oral Q4H PRN Jackelyn Poling, NP        buPROPion (WELLBUTRIN SR) 12 hr tablet 150 mg  150 mg Oral BID Nira Conn A, NP   150 mg at 12/19/21 0931    diazepam (VALIUM) tablet 5 mg  5 mg Oral Q6H PRN Jackelyn Poling, NP   5 mg at 12/19/21 0641    doxepin (SINEQUAN) capsule 10 mg  10 mg Oral QHS PRN Jackelyn Poling, NP        gabapentin (NEURONTIN) tablet 1,200 mg  1,200 mg Oral BID Nira Conn A, NP   1,200 mg at 12/19/21 0930    hydrOXYzine (ATARAX) tablet 25 mg  25 mg Oral Q6H PRN Jackelyn Poling, NP        lamoTRIgine (LAMICTAL) tablet 25 mg  25 mg Oral Daily Nira Conn A, NP   25 mg at 12/19/21 0930    loperamide (IMODIUM) capsule 2-4 mg  2-4 mg Oral PRN Jackelyn Poling, NP        magnesium hydroxide (MILK OF MAGNESIA) suspension 30 mL  30 mL Oral Daily PRN Jackelyn Poling, NP   30 mL at 12/19/21 0641    multivitamin with minerals tablet 1 tablet  1 tablet Oral Daily Nira Conn A, NP   1 tablet at 12/19/21 9604    ondansetron (ZOFRAN-ODT) disintegrating tablet 4 mg  4 mg Oral Q6H PRN Jackelyn Poling, NP        thiamine tablet 100 mg  100 mg Oral Daily Nira Conn A, NP   100 mg at 12/19/21 5409     Current Outpatient Medications   Medication Sig Dispense Refill    albuterol (VENTOLIN HFA) 108 (90 Base) MCG/ACT inhaler INHALE 1-2 PUFFS BY MOUTH EVERY 6 HOURS AS NEEDED FOR WHEEZE OR SHORTNESS OF  BREATH (Patient taking differently: 1-2 puffs every 6 (six) hours as needed for wheezing or shortness of breath.) 8.5 each 1    buPROPion (WELLBUTRIN XL) 300 MG 24 hr tablet Take 300 mg by mouth daily.      cloNIDine (CATAPRES) 0.1 MG tablet Take 0.1 mg by mouth every 8 (eight) hours as needed (For agitation).      diazepam (VALIUM) 5 MG tablet Take 5 mg by mouth 4 (four) times daily.      doxepin (SINEQUAN) 10 MG/ML solution Take 5 mLs by mouth at bedtime.      gabapentin (NEURONTIN) 600 MG tablet Take 1,200 mg by mouth 2 (two) times daily as needed (For pain).      ibuprofen (ADVIL) 800 MG tablet TAKE 1 TABLET BY MOUTH EVERY 8 HOURS AS NEEDED (Patient  taking differently: Take 800 mg by mouth every 8 (eight) hours as needed (For pain).) 90 tablet 0    lamoTRIgine (LAMICTAL) 200 MG tablet Take 1 tablet (200 mg total) by mouth daily.      SUBLOCADE 300 MG/1.5ML SOSY Inject 1.5 mLs into the skin every 28 (twenty-eight) days.       Labs   Lab Results:   Admission on 12/17/2021   Component Date Value Ref Range Status    SARS Coronavirus 2 by RT PCR 12/17/2021 NEGATIVE  NEGATIVE Final    Comment: (NOTE)  SARS-CoV-2 target nucleic acids are NOT DETECTED.    The SARS-CoV-2 RNA is generally detectable in upper respiratory  specimens during the acute phase of infection. The lowest  concentration of SARS-CoV-2 viral copies this assay can detect is  138 copies/mL. A negative result does not preclude SARS-Cov-2  infection and should not be used as the sole basis for treatment or  other patient management decisions. A negative result may occur with   improper specimen collection/handling, submission of specimen other  than nasopharyngeal swab, presence of viral mutation(s) within the  areas targeted by this assay, and inadequate number of viral  copies(<138 copies/mL). A negative result must be combined with  clinical observations, patient history, and epidemiological  information. The expected result is Negative.    Fact Sheet  for Patients:   BloggerCourse.com    Fact Sheet for Healthcare Providers:   SeriousBroker.it    This test is no                           t yet approved or cleared by the [place] FDA and   has been authorized for detection and/or diagnosis of SARS-CoV-2 by  FDA under an Emergency Use Authorization (EUA). This EUA will remain   in effect (meaning this test can be used) for the duration of the  COVID-19 declaration under Section 564(b)(1) of the Act, 21  U.S.C.section 360bbb-3(b)(1), unless the authorization is terminated   or revoked sooner.        Influenza A by PCR 12/17/2021 NEGATIVE  NEGATIVE Final    Influenza B by PCR 12/17/2021 NEGATIVE  NEGATIVE Final    Comment: (NOTE)  The Xpert Xpress SARS-CoV-2/FLU/RSV plus assay is intended as an aid  in the diagnosis of influenza from Nasopharyngeal swab specimens and  should not be used as a sole basis for treatment. Nasal washings and  aspirates are unacceptable for Xpert Xpress SARS-CoV-2/FLU/RSV  testing.    Fact Sheet for Patients:  BloggerCourse.com    Fact Sheet for Healthcare Providers:  SeriousBroker.it    This test is not yet approved or cleared by the [place] FDA and  has been authorized for detection and/or diagnosis of SARS-CoV-2 by  FDA under an Emergency Use Authorization (EUA). This EUA will remain  in effect (meaning this test can be used) for the duration of the  COVID-19 declaration under Section 564(b)(1) of the Act, 21 U.S.C.  section 360bbb-3(b)(1), unless the authorization is terminated or  revoked.    Performed at [facility], [address], [place], [place]  [address]     SARS Coronavirus 2 Ag 12/17/2021 Negative  Negative Preliminary    WBC 12/17/2021 4.9  4.0 - 10.5 K/uL Final    RBC 12/17/2021 4.63  4.22 - 5.81 MIL/uL Final    Hemoglobin 12/17/2021 13.9  13.0 - 17.0 g/dL Final    HCT  12/17/2021 43.2  39.0 - 52.0 % Final    MCV  12/17/2021 93.3  80.0 - 100.0 fL Final    MCH 12/17/2021 30.0  26.0 - 34.0 pg Final    MCHC 12/17/2021 32.2  30.0 - 36.0 g/dL Final    RDW 81/19/1478 13.2  11.5 - 15.5 % Final    Platelets 12/17/2021 282  150 - 400 K/uL Final    nRBC 12/17/2021 0.0  0.0 - 0.2 % Final    Neutrophils Relative % 12/17/2021 42  % Final    Neutro Abs 12/17/2021 2.1  1.7 - 7.7 K/uL Final    Lymphocytes Relative 12/17/2021 37  % Final    Lymphs Abs 12/17/2021 1.8  0.7 - 4.0 K/uL Final    Monocytes Relative 12/17/2021 14  % Final    Monocytes Absolute 12/17/2021 0.7  0.1 - 1.0 K/uL Final    Eosinophils Relative 12/17/2021 6  % Final    Eosinophils Absolute 12/17/2021 0.3  0.0 - 0.5 K/uL Final    Basophils Relative 12/17/2021 1  % Final    Basophils Absolute 12/17/2021 0.0  0.0 - 0.1 K/uL Final    Immature Granulocytes 12/17/2021 0  % Final    Abs Immature Granulocytes 12/17/2021 0.00  0.00 - 0.07 K/uL Final    Performed at [facility], [address] [place], [place] [address]    Sodium 12/17/2021 139  135 - 145 mmol/L Final    Potassium 12/17/2021 4.2  3.5 - 5.1 mmol/L Final    Chloride 12/17/2021 101  98 - 111 mmol/L Final    CO2 12/17/2021 30  22 - 32 mmol/L Final    Glucose, Bld 12/17/2021 111 (H)  70 - 99 mg/dL Final    Glucose reference range applies only to samples taken after fasting for at least 8 hours.    BUN 12/17/2021 12  6 - 20 mg/dL Final    Creatinine, Ser 12/17/2021 0.96  0.61 - 1.24 mg/dL Final    Calcium 13/05/6577 9.6  8.9 - 10.3 mg/dL Final    Total Protein 12/17/2021 6.7  6.5 - 8.1 g/dL Final    Albumin 46/96/2952 4.0  3.5 - 5.0 g/dL Final    AST 84/13/2440 34  15 - 41 U/L Final    ALT 12/17/2021 34  0 - 44 U/L Final    Alkaline Phosphatase 12/17/2021 75  38 - 126 U/L Final    Total Bilirubin 12/17/2021 0.5  0.3 - 1.2 mg/dL Final    GFR, Estimated 12/17/2021 >60  >60 mL/min Final    Comment: (NOTE)  Calculated using the CKD-EPI Creatinine Equation (2021)     Anion gap 12/17/2021 8  5 - 15 Final    Performed at  [facility], [address], [place], [place] [address]    Hgb A1c MFr Bld 12/17/2021 5.9 (H)  4.8 - 5.6 % Final    Comment: (NOTE)          Prediabetes: 5.7 - 6.4          Diabetes: >6.4          Glycemic control for adults with diabetes: <7.0     Mean Plasma Glucose 12/17/2021 123  mg/dL Final    Comment: (NOTE)  Performed At: [facility]  [address] [place], [place] [id number]  Jolene Schimke MD VQ:[id number]     Alcohol, Ethyl (B) 12/17/2021 <10  <10 mg/dL Final    Comment: (NOTE)  Lowest detectable  limit for serum alcohol is 10 mg/dL.    For medical purposes only.  Performed at [facility], [address], [place], [place]  [address]     TSH 12/17/2021 0.686  0.350 - 4.500 uIU/mL Final    Comment: Performed by a 3rd Generation assay with a functional sensitivity of <=0.01 uIU/mL.  Performed at [facility], [address] [place], [place] [address]     POC Amphetamine UR 12/18/2021 None Detected  NONE DETECTED (Cut Off Level 1000 ng/mL) Final    POC Secobarbital (BAR) 12/18/2021 None Detected  NONE DETECTED (Cut Off Level 300 ng/mL) Final    POC Buprenorphine (BUP) 12/18/2021 Positive (A)  NONE DETECTED (Cut Off Level 10 ng/mL) Final    POC Oxazepam (BZO) 12/18/2021 Positive (A)  NONE DETECTED (Cut Off Level 300 ng/mL) Final    POC Cocaine UR 12/18/2021 Positive (A)  NONE DETECTED (Cut Off Level 300 ng/mL) Final    POC Methamphetamine UR 12/18/2021 None Detected  NONE DETECTED (Cut Off Level 1000 ng/mL) Final    POC Morphine 12/18/2021 None Detected  NONE DETECTED (Cut Off Level 300 ng/mL) Final    POC Oxycodone UR 12/18/2021 None Detected  NONE DETECTED (Cut Off Level 100 ng/mL) Final    POC Methadone UR 12/18/2021 None Detected  NONE DETECTED (Cut Off Level 300 ng/mL) Final    POC Marijuana UR 12/18/2021 Positive (A)  NONE DETECTED (Cut Off Level 50 ng/mL) Final    Cholesterol 12/17/2021 175  0 - 200 mg/dL Final    Triglycerides 12/17/2021 127  <150 mg/dL Final    HDL  09/81/1914 50  >40 mg/dL Final    Total CHOL/HDL Ratio 12/17/2021 3.5  RATIO Final    VLDL 12/17/2021 25  0 - 40 mg/dL Final    LDL Cholesterol 12/17/2021 100 (H)  0 - 99 mg/dL Final    Comment:         Total Cholesterol/HDL:CHD Risk  Coronary Heart Disease Risk Table                      Men   Women   1/2 Average Risk   3.4   3.3   Average Risk       5.0   4.4   2 X Average Risk   9.6   7.1   3 X Average Risk  23.4   11.0    Use the calculated Patient Ratio  above and the CHD Risk Table  to determine the patient's CHD Risk.    ATP III CLASSIFICATION (LDL):   <100     mg/dL   Optimal   782-956  mg/dL   Near or Above                     Optimal   130-159  mg/dL   Borderline   213-086  mg/dL   High   >578     mg/dL   Very High  Performed at [facility], [address], [place],  [address]    Admission on 12/14/2021, Discharged on 12/15/2021   Component Date Value Ref Range Status    SARS Coronavirus 2 by RT PCR 12/14/2021 NEGATIVE  NEGATIVE Final    Comment: (NOTE)  SARS-CoV-2 target nucleic acids are NOT DETECTED.    The SARS-CoV-2 RNA is generally detectable in upper respiratory  specimens during the acute phase of infection. The lowest  concentration of SARS-CoV-2  viral copies this assay can detect is  138 copies/mL. A negative result does not preclude SARS-Cov-2  infection and should not be used as the sole basis for treatment or  other patient management decisions. A negative result may occur with   improper specimen collection/handling, submission of specimen other  than nasopharyngeal swab, presence of viral mutation(s) within the  areas targeted by this assay, and inadequate number of viral  copies(<138 copies/mL). A negative result must be combined with  clinical observations, patient history, and epidemiological  information. The expected result is Negative.    Fact Sheet for Patients:   BloggerCourse.com    Fact Sheet for Healthcare Providers:    SeriousBroker.it    This test is no                           t yet approved or cleared by the [place] FDA and   has been authorized for detection and/or diagnosis of SARS-CoV-2 by  FDA under an Emergency Use Authorization (EUA). This EUA will remain   in effect (meaning this test can be used) for the duration of the  COVID-19 declaration under Section 564(b)(1) of the Act, 21  U.S.C.section 360bbb-3(b)(1), unless the authorization is terminated   or revoked sooner.        Influenza A by PCR 12/14/2021 NEGATIVE  NEGATIVE Final    Influenza B by PCR 12/14/2021 NEGATIVE  NEGATIVE Final    Comment: (NOTE)  The Xpert Xpress SARS-CoV-2/FLU/RSV plus assay is intended as an aid  in the diagnosis of influenza from Nasopharyngeal swab specimens and  should not be used as a sole basis for treatment. Nasal washings and  aspirates are unacceptable for Xpert Xpress SARS-CoV-2/FLU/RSV  testing.    Fact Sheet for Patients:  BloggerCourse.com    Fact Sheet for Healthcare Providers:  SeriousBroker.it    This test is not yet approved or cleared by the [place] FDA and  has been authorized for detection and/or diagnosis of SARS-CoV-2 by  FDA under an Emergency Use Authorization (EUA). This EUA will remain  in effect (meaning this test can be used) for the duration of the  COVID-19 declaration under Section 564(b)(1) of the Act, 21 U.S.C.  section 360bbb-3(b)(1), unless the authorization is terminated or  revoked.    Performed at [facility], [address] [place], [place]  [address]     Sodium 12/14/2021 141  135 - 145 mmol/L Final    Potassium 12/14/2021 3.5  3.5 - 5.1 mmol/L Final    Chloride 12/14/2021 103  98 - 111 mmol/L Final    CO2 12/14/2021 29  22 - 32 mmol/L Final    Glucose, Bld 12/14/2021 105 (H)  70 - 99 mg/dL Final    Glucose reference range applies only to samples taken after fasting for at least 8 hours.    BUN 12/14/2021 7   6 - 20 mg/dL Final    Creatinine, Ser 12/14/2021 1.13  0.61 - 1.24 mg/dL Final    Calcium 78/29/5621 9.0  8.9 - 10.3 mg/dL Final    Total Protein 12/14/2021 6.7  6.5 - 8.1 g/dL Final    Albumin 30/86/5784 4.1  3.5 - 5.0 g/dL Final    AST 69/62/9528 47 (H)  15 - 41 U/L Final    ALT 12/14/2021 43  0 - 44 U/L Final    Alkaline Phosphatase 12/14/2021 69  38 - 126 U/L Final  Total Bilirubin 12/14/2021 0.6  0.3 - 1.2 mg/dL Final    GFR, Estimated 12/14/2021 >60  >60 mL/min Final    Comment: (NOTE)  Calculated using the CKD-EPI Creatinine Equation (2021)     Anion gap 12/14/2021 9  5 - 15 Final    Performed at [facility], [address], [place], [place] [address]    Alcohol, Ethyl (B) 12/14/2021 <10  <10 mg/dL Final    Comment: (NOTE)  Lowest detectable limit for serum alcohol is 10 mg/dL.    For medical purposes only.  Performed at [facility], [address], [place], [place]  [address]     WBC 12/14/2021 6.3  4.0 - 10.5 K/uL Final    RBC 12/14/2021 4.71  4.22 - 5.81 MIL/uL Final    Hemoglobin 12/14/2021 14.0  13.0 - 17.0 g/dL Final    HCT 09/81/1914 43.8  39.0 - 52.0 % Final    MCV 12/14/2021 93.0  80.0 - 100.0 fL Final    MCH 12/14/2021 29.7  26.0 - 34.0 pg Final    MCHC 12/14/2021 32.0  30.0 - 36.0 g/dL Final    RDW 78/29/5621 12.9  11.5 - 15.5 % Final    Platelets 12/14/2021 254  150 - 400 K/uL Final    nRBC 12/14/2021 0.0  0.0 - 0.2 % Final    Neutrophils Relative % 12/14/2021 65  % Final    Neutro Abs 12/14/2021 4.2  1.7 - 7.7 K/uL Final    Lymphocytes Relative 12/14/2021 21  % Final    Lymphs Abs 12/14/2021 1.3  0.7 - 4.0 K/uL Final    Monocytes Relative 12/14/2021 10  % Final    Monocytes Absolute 12/14/2021 0.7  0.1 - 1.0 K/uL Final    Eosinophils Relative 12/14/2021 3  % Final    Eosinophils Absolute 12/14/2021 0.2  0.0 - 0.5 K/uL Final    Basophils Relative 12/14/2021 1  % Final    Basophils Absolute 12/14/2021 0.0  0.0 - 0.1 K/uL Final    Immature Granulocytes 12/14/2021 0  % Final    Abs  Immature Granulocytes 12/14/2021 0.01  0.00 - 0.07 K/uL Final    Performed at [facility], [address], [place], [place] [address]    Acetaminophen (Tylenol), Serum 12/14/2021 <10 (L)  10 - 30 ug/mL Final    Comment: (NOTE)  Therapeutic concentrations vary significantly. A range of 10-30 ug/mL   may be an effective concentration for many patients. However, some   are best treated at concentrations outside of this range.  Acetaminophen concentrations >150 ug/mL at 4 hours after ingestion   and >50 ug/mL at 12 hours after ingestion are often associated with   toxic reactions.    Performed at [facility], [address] [place], [place]  [address]     Salicylate Lvl 12/14/2021 <7.0 (L)  7.0 - 30.0 mg/dL Final    Performed at [facility], [address] [place], [place] [address]   Admission on 09/19/2021, Discharged on 09/19/2021   Component Date Value Ref Range Status    Sodium 09/19/2021 139  135 - 145 mmol/L Final    Potassium 09/19/2021 4.3  3.5 - 5.1 mmol/L Final    Chloride 09/19/2021 106  98 - 111 mmol/L Final    CO2 09/19/2021 27  22 - 32 mmol/L Final    Glucose, Bld 09/19/2021 88  70 - 99 mg/dL Final    Glucose  reference range applies only to samples taken after fasting for at least 8 hours.    BUN 09/19/2021 17  6 - 20 mg/dL Final    Creatinine, Ser 09/19/2021 1.01  0.61 - 1.24 mg/dL Final    Calcium 09/81/1914 9.2  8.9 - 10.3 mg/dL Final    Total Protein 09/19/2021 7.1  6.5 - 8.1 g/dL Final    Albumin 78/29/5621 4.1  3.5 - 5.0 g/dL Final    AST 30/86/5784 34  15 - 41 U/L Final    ALT 09/19/2021 27  0 - 44 U/L Final    Alkaline Phosphatase 09/19/2021 72  38 - 126 U/L Final    Total Bilirubin 09/19/2021 0.8  0.3 - 1.2 mg/dL Final    GFR, Estimated 09/19/2021 >60  >60 mL/min Final    Comment: (NOTE)  Calculated using the CKD-EPI Creatinine Equation (2021)     Anion gap 09/19/2021 6  5 - 15 Final    Performed at [facility], [address] [place], [place] [address]    WBC  09/19/2021 4.5  4.0 - 10.5 K/uL Final    RBC 09/19/2021 4.70  4.22 - 5.81 MIL/uL Final    Hemoglobin 09/19/2021 14.2  13.0 - 17.0 g/dL Final    HCT 52/84/1324 42.9  39.0 - 52.0 % Final    MCV 09/19/2021 91.3  80.0 - 100.0 fL Final    MCH 09/19/2021 30.2  26.0 - 34.0 pg Final    MCHC 09/19/2021 33.1  30.0 - 36.0 g/dL Final    RDW 40/07/2724 13.4  11.5 - 15.5 % Final    Platelets 09/19/2021 262  150 - 400 K/uL Final    nRBC 09/19/2021 0.0  0.0 - 0.2 % Final    Performed at [facility], [address] [place], [place] [address]    Color, Urine 09/19/2021 YELLOW  YELLOW Final    YELLOW    APPearance 09/19/2021 CLEAR  CLEAR Final    CLEAR    Specific Gravity, Urine 09/19/2021 1.020  1.005 - 1.030 Final    pH 09/19/2021 5.5  5.0 - 8.0 Final    Glucose, UA 09/19/2021 NEGATIVE  NEGATIVE mg/dL Final    Hgb urine dipstick 09/19/2021 NEGATIVE  NEGATIVE Final    Bilirubin Urine 09/19/2021 NEGATIVE  NEGATIVE Final    Ketones, ur 09/19/2021 NEGATIVE  NEGATIVE mg/dL Final    Protein, ur 03/47/4259 NEGATIVE  NEGATIVE mg/dL Final    Nitrite 56/38/7564 NEGATIVE  NEGATIVE Final    Leukocytes,Ua 09/19/2021 NEGATIVE  NEGATIVE Final    Comment: Microscopic not done on urines with negative protein, blood, leukocytes, nitrite, or glucose < 500 mg/dL.  Performed at [facility], [address] [place], [place] [address]    Admission on 07/02/2021, Discharged on 07/02/2021   Component Date Value Ref Range Status    SARS Coronavirus 2 by RT PCR 07/02/2021 NEGATIVE  NEGATIVE Final    Comment: (NOTE)  SARS-CoV-2 target nucleic acids are NOT DETECTED.    The SARS-CoV-2 RNA is generally detectable in upper respiratory  specimens during the acute phase of infection. The lowest  concentration of SARS-CoV-2 viral copies this assay can detect is  138 copies/mL. A negative result does not preclude SARS-Cov-2  infection and should not be used as the sole basis for treatment or  other patient management decisions. A negative  result may occur with   improper specimen collection/handling, submission of specimen other  than nasopharyngeal swab, presence of viral mutation(s)  within the  areas targeted by this assay, and inadequate number of viral  copies(<138 copies/mL). A negative result must be combined with  clinical observations, patient history, and epidemiological  information. The expected result is Negative.    Fact Sheet for Patients:   BloggerCourse.com    Fact Sheet for Healthcare Providers:   SeriousBroker.it    This test is no                           t yet approved or cleared by the [place] FDA and   has been authorized for detection and/or diagnosis of SARS-CoV-2 by  FDA under an Emergency Use Authorization (EUA). This EUA will remain   in effect (meaning this test can be used) for the duration of the  COVID-19 declaration under Section 564(b)(1) of the Act, 21  U.S.C.section 360bbb-3(b)(1), unless the authorization is terminated   or revoked sooner.        Influenza A by PCR 07/02/2021 NEGATIVE  NEGATIVE Final    Influenza B by PCR 07/02/2021 NEGATIVE  NEGATIVE Final    Comment: (NOTE)  The Xpert Xpress SARS-CoV-2/FLU/RSV plus assay is intended as an aid  in the diagnosis of influenza from Nasopharyngeal swab specimens and  should not be used as a sole basis for treatment. Nasal washings and  aspirates are unacceptable for Xpert Xpress SARS-CoV-2/FLU/RSV  testing.    Fact Sheet for Patients:  BloggerCourse.com    Fact Sheet for Healthcare Providers:  SeriousBroker.it    This test is not yet approved or cleared by the [place] FDA and  has been authorized for detection and/or diagnosis of SARS-CoV-2 by  FDA under an Emergency Use Authorization (EUA). This EUA will remain  in effect (meaning this test can be used) for the duration of the  COVID-19 declaration under Section 564(b)(1) of the Act, 21 U.S.C.  section  360bbb-3(b)(1), unless the authorization is terminated or  revoked.    Performed at [facility], [address] [place],  [place] [address]      Blood Alcohol level:   Lab Results   Component Value Date    [facility] <10 12/17/2021    ETH <10 12/14/2021     Metabolic Disorder Labs:  Lab Results   Component Value Date    HGBA1C 5.9 (H) 12/17/2021    MPG 123 12/17/2021    MPG 119.76 12/03/2020     Lab Results   Component Value Date    PROLACTIN 8.9 01/04/2021    PROLACTIN 16.2 (H) 12/03/2020     Lab Results   Component Value Date    CHOL 175 12/17/2021    TRIG 127 12/17/2021    HDL 50 12/17/2021    CHOLHDL 3.5 12/17/2021    VLDL 25 12/17/2021    LDLCALC 100 (H) 12/17/2021    LDLCALC 104 (H) 12/03/2020     Therapeutic Lab Levels:  No results found for: LITHIUM  No results found for: VALPROATE  No components found for:  CBMZ    Physical Findings     AUDIT      Flowsheet Row Admission (Discharged) from 07/14/2019 in [facility]   Alcohol Use Disorder Identification Test Final Score (AUDIT) 21         PHQ2-9      Flowsheet Row Office Visit from 08/03/2021 in [facility] ED from 12/03/2020 in [facility]   PHQ-2 Total  Score 0 4   PHQ-9 Total Score 3 18         Flowsheet Row ED from 12/17/2021 in [facility] ED from 12/14/2021 in [facility] EMERGENCY DEPARTMENT ED from 12/11/2021 in [facility]   C-SSRS RISK CATEGORY Error: Q3, 4, or 5 should not be populated when Q2 is No No Risk No Risk           Musculoskeletal   Strength & Muscle Tone: within normal limits  Gait & Station: normal  Patient leans: N/A    Psychiatric Specialty Exam   Presentation   General Appearance: Appropriate for Environment; Casual    Eye Contact:Fair    Speech:Clear and Coherent; Normal Rate    Speech Volume:Normal    Handedness:Right    Mood and Affect    Mood:Dysphoric    Affect:Appropriate; Congruent (dysphoric)    Thought Process   Thought Processes:Coherent; Goal Directed; Linear    Descriptions of Associations:Intact    Orientation:Full (Time, Place and Person)    Thought Content:WDL; Logical   Diagnosis of Schizophrenia or Schizoaffective disorder in past: No   Duration of Psychotic Symptoms: Less than six months     Hallucinations:Hallucinations: None    Ideas of Reference:None    Suicidal Thoughts:Suicidal Thoughts: No    Homicidal Thoughts:Homicidal Thoughts: No    Sensorium   Memory:Immediate Good; Recent Fair; Remote Fair    Judgment:Fair    Insight:Fair    Executive Functions   Concentration:Fair    Attention Span:Fair    Recall:Fair    Fund of Knowledge:Good    Language:Good    Psychomotor Activity   Psychomotor Activity:Psychomotor Activity: Normal    Assets   Assets:Desire for Improvement; Financial Resources/Insurance; Physical Health; Resilience; Social Support    Sleep   Sleep:Sleep: Fair  Number of Hours of Sleep: 5    No data recorded    Physical Exam   Physical Exam  Constitutional:       Appearance: Normal appearance. He is normal weight.   HENT:      Head: Normocephalic and atraumatic.   Eyes:      Extraocular Movements: Extraocular movements intact.   Pulmonary:      Effort: Pulmonary effort is normal.   Neurological:      General: No focal deficit present.      Mental Status: [name] to person, place, and time.   Psychiatric:         Attention and Perception: Attention and perception normal.         Speech: Speech normal.         Behavior: Behavior normal. Behavior is cooperative.         Thought Content: Thought content normal.     Review of Systems   Constitutional:  Negative for chills and fever.   HENT:  Negative for hearing loss.    Eyes:  Negative for discharge and redness.   Respiratory:  Negative for cough.    Cardiovascular:  Negative for chest pain.   Gastrointestinal:  Negative for abdominal pain.    Musculoskeletal:  Negative for myalgias.   Neurological:  Negative for headaches.   Blood pressure 131/80, pulse 62, temperature (!) 97.5 F (36.4 C), resp. rate 18, SpO2 100 %. There is no height or weight on file to calculate BMI.    Treatment Plan Summary:  41 yo male with history of polysubstance abuse, MDD, SIMD/SIPD who presented tot  he [facility] on 12/17/21 under IVC for psychosis. . Patient's psychosis cleared and IVC was discontinued; he reported desire for assistance with substance use and he was transferred to the [facility]. UDS+buprenorphine, benzos, cocaine. Etoh negative.    Patient denies SI/HI/AVH. Patient is not interested in substance use treatment at this time. He is not appropriate for sober living as he declines to discontinue valium that he is prescribed outpatient. states that his wife is looking for alternate housing options. Patient reports difficulty with urination- patient is only able to describe this difficulty as not urinating as much as he normally does-last urinated yesterday. No dysuria, blood in the urine, pain. VSS; labs reviewed- wnl. UA ordered.    On interview and per documentation, patient has been drowsy for majority of stay in [facility]. CIWA scores consistently low (0 and 2) apart from one reading of 10. Recommend to monitor CIWA closely and to administer valium only if deemed appropriate by score to avoid oversedation. On review of PMP, strong suspicion/concern that patient is misusing/abusing controlled medications. Over the last 6 months he has had 10 prescribers for opioids or sedative medications.    Continue home medications at this time and have valium available PRN. Discussed with patient this will not be prescribed at discharge    Anticipate discharge tomorrow    Estella Husk, MD  12/19/2021 4:21 PM      Electronically signed by Estella Husk, MD at 12/19/2021  4:45 PM EDT

## 2021-12-19 NOTE — ED Notes (Signed)
Formatting of this note might be different from the original.  Pt is currently sleeping, no distress noted, environmental check complete, will continue to monitor patient for safety.    Electronically signed by Ashok Norris, RN at 12/19/2021  7:54 PM EDT

## 2021-12-19 NOTE — ED Notes (Signed)
Formatting of this note might be different from the original.  Did not want to do AA tonight.  Electronically signed by Rae Lips B at 12/19/2021  7:49 PM EDT

## 2021-12-20 MED ORDER — ALBUTEROL SULFATE HFA 108 (90 BASE) MCG/ACT IN AERS
1.0000 | INHALATION_SPRAY | Freq: Four times a day (QID) | RESPIRATORY_TRACT | Status: DC | PRN
  Filled 2021-12-20: qty 6.7

## 2021-12-20 MED ORDER — LAMOTRIGINE 25 MG PO TABS: 25.0000 mg | ORAL_TABLET | Freq: Every day | ORAL | 0 refills | Status: DC

## 2021-12-20 MED ORDER — DOXEPIN HCL 10 MG PO CAPS
10.0000 mg | ORAL_CAPSULE | Freq: Every evening | ORAL | 0 refills | Status: DC | PRN
Start: 2021-12-20 — End: 2023-01-13

## 2021-12-20 MED ORDER — GABAPENTIN 600 MG PO TABS
1200.0000 mg | ORAL_TABLET | Freq: Two times a day (BID) | ORAL | 0 refills | Status: DC
Start: 2021-12-20 — End: 2023-01-13

## 2021-12-20 MED ORDER — BUPROPION HCL ER (SR) 150 MG PO TB12: 150.0000 mg | ORAL_TABLET | Freq: Two times a day (BID) | ORAL | 0 refills | Status: DC

## 2021-12-20 MED ORDER — ALBUTEROL SULFATE HFA 108 (90 BASE) MCG/ACT IN AERS: 2.0000 | INHALATION_SPRAY | Freq: Four times a day (QID) | RESPIRATORY_TRACT | 1 refills | Status: DC | PRN

## 2021-12-20 NOTE — ED Notes (Signed)
Breakfast given.  

## 2021-12-20 NOTE — ED Notes (Signed)
Pt refuse to come to group 

## 2021-12-20 NOTE — ED Provider Notes (Signed)
FBC/OBS ASAP Discharge Summary ? ?Date and Time: 12/20/2021 12:30 PM  ?Name: Randall Parsons  ?MRN:  [id number]  ? ?Discharge Diagnoses:  ?Final diagnoses:  ?Cocaine abuse with cocaine-induced mood disorder (HCC)  ?MDD (major depressive disorder), recurrent episode, moderate (HCC)  ?Benzodiazepine withdrawal, uncomplicated (HCC)  ? ? ?Subjective:  ?Patient seen and chart reviewed. NAEON. I was notified by staff prior to interview that patient was requesting discharge. Patient is calm , cooperative and pleasant. He denies SI/HI/AVH. He states that his wife has found a place for him to stay and verbalizes readiness for discharge. Patient is informed that he will not be prescribed benzos at discharge and that he will need to call his regular outpatient provider regarding this- patient was in agreement and verbalized understanding.  ? ? ?Stay Summary:  ?41 yo male with history of polysubstance abuse, MDD, SIMD/SIPD who presented tot he [facility] on 12/17/21 under IVC for psychosis. . Patient's psychosis cleared and IVC was discontinued; he reported desire for assistance with substance use and he was transferred to the [facility] on 12/18/21.. UDS+buprenorphine, benzos, cocaine. Etoh negative. Home medications were continued. Patient declined substance use treatment during his stay. Patient denied SI/HI and was not psychotic throughout his stay at the [facility]. He was discharged per request on 12/20/2021 ? ?Total Time spent with patient: 15 minutes ? ?Past Psychiatric History: see H&P ?Past Medical History:  ?Past Medical History:  ?Diagnosis Date  ? CHF (congestive heart failure) (HCC)   ? Exposure to hepatitis B   ? Exposure to hepatitis C   ? Hepatitis C   ? History of opioid abuse (HCC)   ? reports heroin abuse, ending around 2017  ? Hypertension   ? IV drug abuse (HCC)   ? Renal disorder   ? kidney stones  ?  ?Past Surgical History:  ?Procedure Laterality Date  ? APPENDECTOMY    ? EYE SURGERY    ? secondary to dog bite  ? TIBIA FRACTURE  SURGERY Right   ? ?Family History:  ?Family History  ?Problem Relation Age of Onset  ? Alcohol abuse Father   ? Melanoma Mother   ? Breast cancer Mother   ? Colon cancer Neg Hx   ? Prostate cancer Neg Hx   ? ?Family Psychiatric History: see H&P ?Social History:  ?Social History  ? ?Substance and Sexual Activity  ?Alcohol Use No  ?   ?Social History  ? ?Substance and Sexual Activity  ?Drug Use Not Currently  ? Types: IV, Cocaine  ? Comment: hx of heroin abuse (opioid addiction), crack  ?  ?Social History  ? ?Socioeconomic History  ? Marital status: Married  ?  Spouse name: Not on file  ? Number of children: Not on file  ? Years of education: Not on file  ? Highest education level: Not on file  ?Occupational History  ? Not on file  ?Tobacco Use  ? Smoking status: Former  ? Smokeless tobacco: Never  ?Vaping Use  ? Vaping Use: Every day  ?Substance and Sexual Activity  ? Alcohol use: No  ? Drug use: Not Currently  ?  Types: IV, Cocaine  ?  Comment: hx of heroin abuse (opioid addiction), crack  ? Sexual activity: Not on file  ?Other Topics Concern  ? Not on file  ?Social History Narrative  ? Working at [employer] in [place].  Married, has 5 step kids (2 at home).  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   ? ?Social Determinants of Health  ? ?  Financial Resource Strain: Not on file  ?Food Insecurity: Not on file  ?Transportation Needs: Not on file  ?Physical Activity: Not on file  ?Stress: Not on file  ?Social Connections: Not on file  ? ?SDOH:  ?SDOH Screenings  ? ?Alcohol Screen: Not on file  ?Depression (PHQ2-9): Low Risk   ? PHQ-2 Score: 3  ?Financial Resource Strain: Not on file  ?Food Insecurity: Not on file  ?Housing: Not on file  ?Physical Activity: Not on file  ?Social Connections: Not on file  ?Stress: Not on file  ?Tobacco Use: Medium Risk  ? Smoking Tobacco Use: Former  ? Smokeless Tobacco Use: Never  ? Passive Exposure: Not on file  ?Transportation Needs: Not on file  ? ? ?Tobacco Cessation:  N/A, patient does not  currently use tobacco products ? ?Current Medications:  ?Current Facility-Administered Medications  ?Medication Dose Route Frequency Provider Last Rate Last Admin  ? acetaminophen (TYLENOL) tablet 650 mg  650 mg Oral Q6H PRN Nira Conn A, NP   650 mg at 12/19/21 1250  ? albuterol (VENTOLIN HFA) 108 (90 Base) MCG/ACT inhaler 1-2 puff  1-2 puff Inhalation Q6H PRN Estella Husk, MD      ? alum & mag hydroxide-simeth (MAALOX/MYLANTA) 200-200-20 MG/5ML suspension 30 mL  30 mL Oral Q4H PRN Jackelyn Poling, NP      ? buPROPion (WELLBUTRIN SR) 12 hr tablet 150 mg  150 mg Oral BID Nira Conn A, NP   150 mg at 12/20/21 1033  ? diazepam (VALIUM) tablet 5 mg  5 mg Oral Q6H PRN Nira Conn A, NP   5 mg at 12/19/21 2128  ? doxepin (SINEQUAN) capsule 10 mg  10 mg Oral QHS PRN Nira Conn A, NP      ? gabapentin (NEURONTIN) tablet 1,200 mg  1,200 mg Oral BID Nira Conn A, NP   1,200 mg at 12/20/21 1033  ? hydrOXYzine (ATARAX) tablet 25 mg  25 mg Oral Q6H PRN Jackelyn Poling, NP      ? lamoTRIgine (LAMICTAL) tablet 25 mg  25 mg Oral Daily Nira Conn A, NP   25 mg at 12/20/21 1033  ? loperamide (IMODIUM) capsule 2-4 mg  2-4 mg Oral PRN Nira Conn A, NP      ? magnesium hydroxide (MILK OF MAGNESIA) suspension 30 mL  30 mL Oral Daily PRN Nira Conn A, NP   30 mL at 12/19/21 0641  ? multivitamin with minerals tablet 1 tablet  1 tablet Oral Daily Nira Conn A, NP   1 tablet at 12/20/21 1033  ? ondansetron (ZOFRAN-ODT) disintegrating tablet 4 mg  4 mg Oral Q6H PRN Nira Conn A, NP      ? thiamine tablet 100 mg  100 mg Oral Daily Nira Conn A, NP   100 mg at 12/20/21 1032  ? ?Current Outpatient Medications  ?Medication Sig Dispense Refill  ? albuterol (VENTOLIN HFA) 108 (90 Base) MCG/ACT inhaler Inhale 2 puffs into the lungs every 6 (six) hours as needed for wheezing or shortness of breath (shortness of breath or wheezing). INHALE 1-2 PUFFS BY MOUTH EVERY 6 HOURS AS NEEDED FOR WHEEZE OR SHORTNESS OF  BREATH ?Strength: 108 (90 Base) MCG/ACT 8.5 each 1  ? buPROPion (WELLBUTRIN SR) 150 MG 12 hr tablet Take 1 tablet (150 mg total) by mouth 2 (two) times daily. 60 tablet 0  ? doxepin (SINEQUAN) 10 MG capsule Take 1 capsule (10 mg total) by mouth at bedtime as needed. 30 capsule 0  ?  gabapentin (NEURONTIN) 600 MG tablet Take 2 tablets (1,200 mg total) by mouth 2 (two) times daily. 120 tablet 0  ? [START ON 12/21/2021] lamoTRIgine (LAMICTAL) 25 MG tablet Take 1 tablet (25 mg total) by mouth daily. 30 tablet 0  ? ? ?PTA Medications: (Not in a hospital admission) ? ? ?Musculoskeletal  ?Strength & Muscle Tone: within normal limits ?Gait & Station: normal ?Patient leans: N/A ? ?Psychiatric Specialty Exam  ?Presentation  ?General Appearance: Appropriate for Environment; Casual ? ?Eye Contact:Fair ? ?Speech:Clear and Coherent; Normal Rate ? ?Speech Volume:Normal ? ?Handedness:Right ? ? ?Mood and Affect  ?Mood:Euthymic ("good") ? ?Affect:Appropriate; Congruent ? ? ?Thought Process  ?Thought Processes:Coherent; Goal Directed; Linear ? ?Descriptions of Associations:Intact ? ?Orientation:Full (Time, Place and Person) ? ?Thought Content:WDL; Logical ? Diagnosis of Schizophrenia or Schizoaffective disorder in past: No ? Duration of Psychotic Symptoms: Less than six months ? ? Hallucinations:Hallucinations: None ? ?Ideas of Reference:None ? ?Suicidal Thoughts:Suicidal Thoughts: No ? ?Homicidal Thoughts:Homicidal Thoughts: No ? ? ?Sensorium  ?Memory:Immediate Good; Recent Good; Remote Fair ? ?Judgment:Fair ? ?Insight:Fair ? ? ?Executive Functions  ?Concentration:Good ? ?Attention Span:Good ? ?Recall:Good ? ?Fund of Knowledge:Good ? ?Language:Good ? ? ?Psychomotor Activity  ?Psychomotor Activity:Psychomotor Activity: Normal ? ? ?Assets  ?Assets:Communication Skills; Desire for Improvement; Financial Resources/Insurance; Resilience; Social Support; Physical Health ? ? ?Sleep  ?Sleep:Sleep: Fair ? ? ?No data recorded ? ?Physical Exam   ?Physical Exam ?Constitutional:   ?   Appearance: Normal appearance. He is normal weight.  ?HENT:  ?   Head: Normocephalic and atraumatic.  ?Eyes:  ?   Extraocular Movements: Extraocular movements intact.  ?Pulmonary:  ?   E

## 2021-12-20 NOTE — Discharge Instructions (Signed)
Take all medications as prescribed by his/her mental healthcare provider. ?Report any adverse effects and or reactions from the medicines to your outpatient provider promptly. ?Do not engage in alcohol and or illegal drug use while on prescription medicines. ?In the event of worsening symptoms, call the crisis hotline, 911 and or go to the nearest ED for appropriate evaluation and treatment of symptoms. ?follow-up with your primary care provider for your other medical issues, concerns and or health care needs. ? ?Please come to [facility] (this facility) during walk in hours for appointment with psychiatrist for further medication management. Please come for follow up appointment before your 7 day sample runs out so that you will not be without medication.  Walk in hours are 8-11 AM Monday through Thursday. There is often a wait, and it is best to arrive by 7:30 AM.  ? ?Address:  ?[address], in [place], [address] ?Ph: [phone]  ? ?

## 2021-12-20 NOTE — Progress Notes (Addendum)
Received Randall Parsons this AM asleep in his bed, he got up for breakfast and medications. Later he received his discharge order. He called his wife. He received his AVS, questions answered, he received his medications and prescriptions. He retrieved his personal belonging and was escorted to the lobby. His last CIWA score was one.  ?

## 2021-12-20 NOTE — ED Notes (Signed)
Pt is currently sleeping, no distress noted, environmental check complete, will continue to monitor patient for safety. ? ?

## 2021-12-20 NOTE — ED Notes (Signed)
Formatting of this note might be different from the original.  Pt is currently sleeping, no distress noted, environmental check complete, will continue to monitor patient for safety.    Electronically signed by Ashok Norris, RN at 12/20/2021  3:05 AM EDT

## 2021-12-20 NOTE — ED Notes (Signed)
Formatting of this note might be different from the original.  Pt refuse to come to group.  Electronically signed by Alexandria Lodge D, NT at 12/20/2021 10:46 AM EDT

## 2021-12-20 NOTE — ED Notes (Signed)
Formatting of this note might be different from the original.  Breakfast given.   Electronically signed by Alexandria Lodge D, NT at 12/20/2021  8:40 AM EDT

## 2021-12-20 NOTE — ED Provider Notes (Signed)
Formatting of this note is different from the original.  FBC/OBS ASAP Discharge Summary    Date and Time: 12/20/2021 12:30 PM   Name: Lucas Hicks   MRN:  [id number]     Discharge Diagnoses:   Final diagnoses:   Cocaine abuse with cocaine-induced mood disorder (HCC)   MDD (major depressive disorder), recurrent episode, moderate (HCC)   Benzodiazepine withdrawal, uncomplicated (HCC)     Subjective:   Patient seen and chart reviewed. NAEON. I was notified by staff prior to interview that patient was requesting discharge. Patient is calm , cooperative and pleasant. He denies SI/HI/AVH. He states that his wife has found a place for him to stay and verbalizes readiness for discharge. Patient is informed that he will not be prescribed benzos at discharge and that he will need to call his regular outpatient provider regarding this- patient was in agreement and verbalized understanding.     Stay Summary:   41 yo male with history of polysubstance abuse, MDD, SIMD/SIPD who presented tot he [facility] on 12/17/21 under IVC for psychosis. . Patient's psychosis cleared and IVC was discontinued; he reported desire for assistance with substance use and he was transferred to the [facility] on 12/18/21.. UDS+buprenorphine, benzos, cocaine. Etoh negative. Home medications were continued. Patient declined substance use treatment during his stay. Patient denied SI/HI and was not psychotic throughout his stay at the [facility] He was discharged per request on 12/20/2021    Total Time spent with patient: 15 minutes    Past Psychiatric History: see H&P  Past Medical History:   Past Medical History:   Diagnosis Date    CHF (congestive heart failure) (HCC)     Exposure to hepatitis B     Exposure to hepatitis C     Hepatitis C     History of opioid abuse (HCC)     reports heroin abuse, ending around 2017    Hypertension     IV drug abuse (HCC)     Renal disorder     kidney stones     Past Surgical History:   Procedure Laterality Date    APPENDECTOMY      EYE  SURGERY      secondary to dog bite    TIBIA FRACTURE SURGERY Right      Family History:   Family History   Problem Relation Age of Onset    Alcohol abuse Father     Melanoma Mother     Breast cancer Mother     Colon cancer Neg Hx     Prostate cancer Neg Hx      Family Psychiatric History: see H&P  Social History:   Social History     Substance and Sexual Activity   Alcohol Use No     Social History     Substance and Sexual Activity   Drug Use Not Currently    Types: IV, Cocaine    Comment: hx of heroin abuse (opioid addiction), crack     Social History     Socioeconomic History    Marital status: Married     Spouse name: Not on file    Number of children: Not on file    Years of education: Not on file    Highest education level: Not on file   Occupational History    Not on file   Tobacco Use    Smoking status: Former    Smokeless tobacco: Never   Vaping Use    Vaping Use: Every day  Substance and Sexual Activity    Alcohol use: No    Drug use: Not Currently     Types: IV, Cocaine     Comment: hx of heroin abuse (opioid addiction), crack    Sexual activity: Not on file   Other Topics Concern    Not on file   Social History Narrative    Working at [employer] in [place].  Married, has 5 step kids (2 at home).  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      Social Determinants of Health     Financial Resource Strain: Not on file   Food Insecurity: Not on file   Transportation Needs: Not on file   Physical Activity: Not on file   Stress: Not on file   Social Connections: Not on file     SDOH:   SDOH Screenings     Alcohol Screen: Not on file   Depression (PHQ2-9): Low Risk     PHQ-2 Score: 3   Financial Resource Strain: Not on file   Food Insecurity: Not on file   Housing: Not on file   Physical Activity: Not on file   Social Connections: Not on file   Stress: Not on file   Tobacco Use: Medium Risk    Smoking Tobacco Use: Former    Smokeless Tobacco Use: Never    Passive Exposure: Not on file   Transportation Needs: Not on file      Tobacco Cessation:  N/A, patient does not currently use tobacco products    Current Medications:   Current Facility-Administered Medications   Medication Dose Route Frequency Provider Last Rate Last Admin    acetaminophen (TYLENOL) tablet 650 mg  650 mg Oral Q6H PRN Nira Conn A, NP   650 mg at 12/19/21 1250    albuterol (VENTOLIN HFA) 108 (90 Base) MCG/ACT inhaler 1-2 puff  1-2 puff Inhalation Q6H PRN Estella Husk, MD        alum & mag hydroxide-simeth (MAALOX/MYLANTA) 200-200-20 MG/5ML suspension 30 mL  30 mL Oral Q4H PRN Jackelyn Poling, NP        buPROPion (WELLBUTRIN SR) 12 hr tablet 150 mg  150 mg Oral BID Nira Conn A, NP   150 mg at 12/20/21 1033    diazepam (VALIUM) tablet 5 mg  5 mg Oral Q6H PRN Nira Conn A, NP   5 mg at 12/19/21 2128    doxepin (SINEQUAN) capsule 10 mg  10 mg Oral QHS PRN Jackelyn Poling, NP        gabapentin (NEURONTIN) tablet 1,200 mg  1,200 mg Oral BID Nira Conn A, NP   1,200 mg at 12/20/21 1033    hydrOXYzine (ATARAX) tablet 25 mg  25 mg Oral Q6H PRN Jackelyn Poling, NP        lamoTRIgine (LAMICTAL) tablet 25 mg  25 mg Oral Daily Nira Conn A, NP   25 mg at 12/20/21 1033    loperamide (IMODIUM) capsule 2-4 mg  2-4 mg Oral PRN Nira Conn A, NP        magnesium hydroxide (MILK OF MAGNESIA) suspension 30 mL  30 mL Oral Daily PRN Nira Conn A, NP   30 mL at 12/19/21 0641    multivitamin with minerals tablet 1 tablet  1 tablet Oral Daily Nira Conn A, NP   1 tablet at 12/20/21 1033    ondansetron (ZOFRAN-ODT) disintegrating tablet 4 mg  4 mg Oral Q6H PRN Jackelyn Poling, NP  thiamine tablet 100 mg  100 mg Oral Daily Nira Conn A, NP   100 mg at 12/20/21 1032     Current Outpatient Medications   Medication Sig Dispense Refill    albuterol (VENTOLIN HFA) 108 (90 Base) MCG/ACT inhaler Inhale 2 puffs into the lungs every 6 (six) hours as needed for wheezing or shortness of breath (shortness of breath or wheezing). INHALE 1-2 PUFFS BY MOUTH EVERY 6 HOURS AS  NEEDED FOR WHEEZE OR SHORTNESS OF BREATH  Strength: 108 (90 Base) MCG/ACT 8.5 each 1    buPROPion (WELLBUTRIN SR) 150 MG 12 hr tablet Take 1 tablet (150 mg total) by mouth 2 (two) times daily. 60 tablet 0    doxepin (SINEQUAN) 10 MG capsule Take 1 capsule (10 mg total) by mouth at bedtime as needed. 30 capsule 0    gabapentin (NEURONTIN) 600 MG tablet Take 2 tablets (1,200 mg total) by mouth 2 (two) times daily. 120 tablet 0    [START ON 12/21/2021] lamoTRIgine (LAMICTAL) 25 MG tablet Take 1 tablet (25 mg total) by mouth daily. 30 tablet 0     PTA Medications: (Not in a hospital admission)    Musculoskeletal   Strength & Muscle Tone: within normal limits  Gait & Station: normal  Patient leans: N/A    Psychiatric Specialty Exam   Presentation   General Appearance: Appropriate for Environment; Casual    Eye Contact:Fair    Speech:Clear and Coherent; Normal Rate    Speech Volume:Normal    Handedness:Right    Mood and Affect   Mood:Euthymic ("good")    Affect:Appropriate; Congruent    Thought Process   Thought Processes:Coherent; Goal Directed; Linear    Descriptions of Associations:Intact    Orientation:Full (Time, Place and Person)    Thought Content:WDL; Logical   Diagnosis of Schizophrenia or Schizoaffective disorder in past: No   Duration of Psychotic Symptoms: Less than six months     Hallucinations:Hallucinations: None    Ideas of Reference:None    Suicidal Thoughts:Suicidal Thoughts: No    Homicidal Thoughts:Homicidal Thoughts: No    Sensorium   Memory:Immediate Good; Recent Good; Remote Fair    Judgment:Fair    Insight:Fair    Executive Functions   Concentration:Good    Attention Span:Good    Recall:Good    Fund of Knowledge:Good    Language:Good    Psychomotor Activity   Psychomotor Activity:Psychomotor Activity: Normal    Assets   Assets:Communication Skills; Desire for Improvement; Financial Resources/Insurance; Resilience; Social Support; Physical Health    Sleep   Sleep:Sleep: Fair    No data  recorded    Physical Exam   Physical Exam  Constitutional:       Appearance: Normal appearance. He is normal weight.   HENT:      Head: Normocephalic and atraumatic.   Eyes:      Extraocular Movements: Extraocular movements intact.   Pulmonary:      Effort: Pulmonary effort is normal.   Neurological:      General: No focal deficit present.      Mental Status: [name] to person, place, and time.   Psychiatric:         Attention and Perception: Attention and perception normal.         Speech: Speech normal.         Behavior: Behavior normal. Behavior is cooperative.         Thought Content: Thought content normal.     Review of Systems  Constitutional:  Negative for chills and fever.   HENT:  Negative for hearing loss.    Eyes:  Negative for discharge and redness.   Respiratory:  Negative for cough.    Cardiovascular:  Negative for chest pain.   Gastrointestinal:  Negative for abdominal pain.   Musculoskeletal:  Negative for myalgias.   Neurological:  Negative for headaches.   Psychiatric/Behavioral:  Positive for substance abuse. Negative for hallucinations and suicidal ideas.    Blood pressure 138/77, pulse 69, temperature 97.7 F (36.5 C), temperature source Other (Comment), resp. rate 18, SpO2 100 %. There is no height or weight on file to calculate BMI.    Demographic Factors:   Male, Caucasian, and Low socioeconomic status    Loss Factors:  Relationship difficulties    Historical Factors:  Family history of mental illness or substance abuse, Impulsivity, and substance use    Risk Reduction Factors:    Responsible for children under 33 years of age, Sense of responsibility to family, and Positive social support    Continued Clinical Symptoms:   Alcohol/Substance Abuse/Dependencies    Cognitive Features That Contribute To Risk:   Closed-mindedness      Suicide Risk:   Minimal: No identifiable suicidal ideation.  Patients presenting with no risk factors but with morbid ruminations; may be  classified as minimal risk based on the severity of the depressive symptoms    Plan Of Care/Follow-up recommendations:   Activity:  as tolerated  Diet:  regular  Other:       Take all medications as prescribed by his/her mental healthcare provider.  Report any adverse effects and or reactions from the medicines to your outpatient provider promptly.  Do not engage in alcohol and or illegal drug use while on prescription medicines.  In the event of worsening symptoms, call the crisis hotline, 911 and or go to the nearest ED for appropriate evaluation and treatment of symptoms.  follow-up with your primary care provider for your other medical issues, concerns and or health care needs.    Allergies as of 12/20/2021         Reactions    Amoxicillin Hives, Other (See Comments)    Did it involve swelling of the face/tongue/throat, SOB, or low BP? No  Did it involve sudden or severe rash/hives, skin peeling, or any reaction on the inside of your mouth or nose? Yes  Did you need to seek medical attention at a hospital or doctor's office? Yes  When did it last happen?     41 yrs old  If all above answers are "NO", may proceed with cephalosporin use.    Prednisone Other (See Comments)    irritable           Medication List       STOP taking these medications      cloNIDine 0.1 MG tablet  Commonly known as: CATAPRES    diazepam 5 MG tablet  Commonly known as: VALIUM    ibuprofen 800 MG tablet  Commonly known as: ADVIL    Sublocade 300 MG/1.5ML Sosy  Generic drug: Buprenorphine ER          TAKE these medications      albuterol 108 (90 Base) MCG/ACT inhaler  Commonly known as: VENTOLIN HFA  Inhale 2 puffs into the lungs every 6 (six) hours as needed for wheezing or shortness of breath (shortness of breath or wheezing). INHALE 1-2 PUFFS BY MOUTH EVERY 6 HOURS AS NEEDED FOR WHEEZE OR SHORTNESS OF  BREATH  Strength: 108 (90 Base) MCG/ACT  What changed: See the new instructions.    buPROPion 150 MG 12 hr tablet  Commonly known as:  WELLBUTRIN SR  Take 1 tablet (150 mg total) by mouth 2 (two) times daily.  What changed: Another medication with the same name was removed. Continue taking this medication, and follow the directions you see here.    doxepin 10 MG capsule  Commonly known as: SINEQUAN  Take 1 capsule (10 mg total) by mouth at bedtime as needed.  What changed: Another medication with the same name was removed. Continue taking this medication, and follow the directions you see here.    gabapentin 600 MG tablet  Commonly known as: NEURONTIN  Take 2 tablets (1,200 mg total) by mouth 2 (two) times daily.  What changed:   when to take this  reasons to take this    lamoTRIgine 25 MG tablet  Commonly known as: LAMICTAL  Take 1 tablet (25 mg total) by mouth daily.  Start taking on: December 21, 2021  What changed:   medication strength  how much to take          Patient provided with 7 day samples of gabapentin, lamictal, doxepin, wellbutrin, and albuterol as well as printed scripts for 30 days with no refills.     Patient was provided with follow up information regarding psychiatric outpatient resources in AVS with the assistance of SW prior to discharge.       Disposition: self care    Estella Husk, MD  12/20/2021, 12:30 PM      Electronically signed by Estella Husk, MD at 12/20/2021 12:32 PM EDT

## 2021-12-20 NOTE — Progress Notes (Signed)
Formatting of this note might be different from the original.  Received Lucas Hicks this AM asleep in his bed, he got up for breakfast and medications. Later he received his discharge order. He called his wife. He received his AVS, questions answered, he received his medications and prescriptions. He retrieved his personal belonging and was escorted to the lobby. His last CIWA score was one.   Electronically signed by Rex Kras, RN at 12/20/2021 11:19 AM EDT

## 2021-12-22 ENCOUNTER — Other Ambulatory Visit: Payer: Self-pay

## 2021-12-22 ENCOUNTER — Other Ambulatory Visit (HOSPITAL_COMMUNITY)
Admission: EM | Admit: 2021-12-22 | Discharge: 2021-12-23 | Disposition: A | Discharge status: Home / Self Care | Payer: 59 | Attending: Nurse Practitioner | Admitting: Nurse Practitioner

## 2021-12-22 DIAGNOSIS — R69 Illness, unspecified: Secondary | ICD-10-CM | POA: Diagnosis not present

## 2021-12-22 DIAGNOSIS — F1090 Alcohol use, unspecified, uncomplicated: Secondary | ICD-10-CM | POA: Diagnosis not present

## 2021-12-22 DIAGNOSIS — Z20822 Contact with and (suspected) exposure to covid-19: Secondary | ICD-10-CM | POA: Insufficient documentation

## 2021-12-22 DIAGNOSIS — F1490 Cocaine use, unspecified, uncomplicated: Secondary | ICD-10-CM | POA: Diagnosis not present

## 2021-12-22 DIAGNOSIS — F1290 Cannabis use, unspecified, uncomplicated: Secondary | ICD-10-CM | POA: Insufficient documentation

## 2021-12-22 DIAGNOSIS — F1414 Cocaine abuse with cocaine-induced mood disorder: Secondary | ICD-10-CM | POA: Diagnosis not present

## 2021-12-22 DIAGNOSIS — F332 Major depressive disorder, recurrent severe without psychotic features: Secondary | ICD-10-CM | POA: Diagnosis present

## 2021-12-22 DIAGNOSIS — F333 Major depressive disorder, recurrent, severe with psychotic symptoms: Secondary | ICD-10-CM | POA: Diagnosis present

## 2021-12-22 DIAGNOSIS — F1994 Other psychoactive substance use, unspecified with psychoactive substance-induced mood disorder: Secondary | ICD-10-CM | POA: Diagnosis not present

## 2021-12-22 DIAGNOSIS — F1494 Cocaine use, unspecified with cocaine-induced mood disorder: Secondary | ICD-10-CM | POA: Diagnosis not present

## 2021-12-22 DIAGNOSIS — F411 Generalized anxiety disorder: Secondary | ICD-10-CM | POA: Insufficient documentation

## 2021-12-22 LAB — POC SARS CORONAVIRUS 2 AG: SARSCOV2ONAVIRUS 2 AG: NEGATIVE

## 2021-12-22 MED ORDER — ALUM & MAG HYDROXIDE-SIMETH 200-200-20 MG/5ML PO SUSP: 30.0000 mL | ORAL | Status: DC | PRN

## 2021-12-22 MED ORDER — MAGNESIUM HYDROXIDE 400 MG/5ML PO SUSP: 30.0000 mL | Freq: Every day | ORAL | Status: DC | PRN

## 2021-12-22 MED ORDER — LAMOTRIGINE 25 MG PO TABS
25.0000 mg | ORAL_TABLET | Freq: Every day | ORAL | Status: DC
Start: 2021-12-23 — End: 2021-12-23
  Filled 2021-12-22: qty 1

## 2021-12-22 MED ORDER — DOXEPIN HCL 10 MG PO CAPS
10.0000 mg | ORAL_CAPSULE | Freq: Every evening | ORAL | Status: DC | PRN
  Administered 2021-12-22: 10 mg via ORAL
  Filled 2021-12-22: qty 1

## 2021-12-22 MED ORDER — HYDROXYZINE HCL 25 MG PO TABS: 25.0000 mg | ORAL_TABLET | Freq: Three times a day (TID) | ORAL | Status: DC | PRN

## 2021-12-22 MED ORDER — GABAPENTIN 400 MG PO CAPS
1200.0000 mg | ORAL_CAPSULE | Freq: Two times a day (BID) | ORAL | Status: DC
  Administered 2021-12-22: 1200 mg via ORAL
  Filled 2021-12-22 (×2): qty 3

## 2021-12-22 MED ORDER — ACETAMINOPHEN 325 MG PO TABS: 650.0000 mg | ORAL_TABLET | Freq: Four times a day (QID) | ORAL | Status: DC | PRN

## 2021-12-22 MED ORDER — BUPROPION HCL ER (SR) 150 MG PO TB12
150.0000 mg | ORAL_TABLET | Freq: Two times a day (BID) | ORAL | Status: DC
  Administered 2021-12-22: 150 mg via ORAL
  Filled 2021-12-22 (×2): qty 1

## 2021-12-22 NOTE — BH Assessment (Signed)
Comprehensive Clinical Assessment (CCA) Note ? ?12/22/2021 ?Dravon Younkin ?[id number] ? ?DISPOSITION: Gave clinical report to Quintella Reichert, NP determined Pt meets criteria for FBC. ? ?The patient demonstrates the following risk factors for suicide: Chronic risk factors for suicide include: psychiatric disorder of substance-induced mood disorder and substance use disorder. Acute risk factors for suicide include: family or marital conflict, loss (financial, interpersonal, professional), and recent discharge from inpatient psychiatry. Protective factors for this patient include: positive social support, positive therapeutic relationship, and responsibility to others (children, family). Considering these factors, the overall suicide risk at this point appears to be low. Patient is appropriate for outpatient follow up. ? ?[facility] ED from 12/22/2021 in [facility] ED from 12/17/2021 in [facility] ED from 12/14/2021 in [place]  ?C-SSRS RISK CATEGORY No Risk No Risk No Risk  ? ?  ? ?Pt is a 41 year old married male who presents to [facility] reporting depressive symptoms, severe anxiety, paranoia, auditory hallucinations, and cocaine use. He has a diagnosis of substance-induced mood disorder and was discharged from [facility] 12/20/2021. He says he returned to using cocaine, approximately 4 grams per day. He states he has been "scared to death" and paranoid, believing that plain-clothed policemen were following him. He states due to his paranoia he called law enforcement and was charged with felon possession of cocaine and drug paraphernalia. He says he is already on probation. He says he was released and his wife dropped him off at [facility].  ? ?Pt describes his mood as severely anxious and depressed. Pt acknowledges symptoms including crying spells, social withdrawal, loss of interest in usual pleasures, fatigue, irritability, decreased  concentration, insomnia, decreased appetite, and feelings of guilt, worthlessness and hopelessness. He says he has not slept or eaten since leaving [place]. He reports "seeing people with the same car and the same faces" following him. He says he confronted a stranger and accused him of following him. He says he hears voices berating him. He denies current suicidal ideation or history of suicide attempts. He denies homicidal ideation. He denies use of substances other than cocaine but has a history of using prescription benzodiazepines. ? ?Pt is disheveled, alert and oriented x4. Pt speaks in a clear tone, at moderate volume and normal pace. Motor behavior appears normal. Eye contact is good and Pt is tearful at times. Pt's mood is depressed and anxious, affect is congruent with mood. Thought process is coherent and relevant. Pt says he needs to be back in treatment. ? ? ?Chief Complaint:  ?Chief Complaint  ?Patient presents with  ? Addiction Problem  ? Delusional  ? 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 ? ?CCA Screening, Triage and Referral (STR) ? ?Patient Reported Information ?How did you hear about [place]? Family/Friend ? ?What Is the Reason for Your Visit/Call Today? Pt has a diagnosis of substance-induced mood disorder and was discharged from [facility] 12/20/2021. He says he returned to using cocaine, approximately 4 grams per day. He states he has been "scared to death" and paranoid, believing that plain-clothed policemen were following him. He states due to his paranoia he called law enforcement and was charged with felon possession of cocaine and drug paraphernalia. He says he is already on probation. He says he was released and his wife dropped him off at [facility]. He denies current SI, HI but does report hearing voices that berate him. ? ?How Long Has This Been Causing You  Problems? 1-6 months ? ?What Do You Feel Would Help You the Most Today? Alcohol or Drug  Use Treatment; Treatment for Depression or other mood problem; Medication(s) ? ? ?Have You Recently Had Any Thoughts About Hurting Yourself? No ? ?Are You Planning to Commit Suicide/Harm Yourself At This time? No ? ? ?Have you Recently Had Thoughts About [place]? No ? ?Are You Planning to Harm Someone at This Time? No ? ?Explanation: No data recorded ? ?Have You Used Any Alcohol or Drugs in the Past 24 Hours? Yes ? ?How Long Ago Did You Use Drugs or Alcohol? No data recorded ?What Did You Use and How Much? Pt reports using 4 grams of cocaine daily. ? ? ?Do You Currently Have a Therapist/Psychiatrist? Yes ? ?Name of Therapist/Psychiatrist: Mervin Hack, psychiatrist ? ? ?Have You Been Recently Discharged From Any Office Practice or Programs? Yes ? ?Explanation of Discharge From Practice/Program: Discharged from [facility] 12/20/2021 ? ? ?  ?CCA Screening Triage Referral Assessment ?Type of Contact: Face-to-Face ? ?Telemedicine Service Delivery:   ?Is this Initial or Reassessment? Initial Assessment ? ?Date Telepsych consult ordered in [facility]:  12/15/21 ? ?Time Telepsych consult ordered in [facility]:  0014 ? ?Location of Assessment: [facility] Assessment Services ? ?Provider Location: [facility] Assessment Services ? ? ?Collateral Involvement: Medical record ? ? ?Does Patient Have a Stage manager Guardian? No data recorded ?Name and Contact of Legal Guardian: No data recorded ?If Minor and Not Living with Parent(s), Who has Custody? NA ? ?Is CPS involved or ever been involved? Never ? ?Is APS involved or ever been involved? Never ? ? ?Patient Determined To Be At Risk for Harm To Self or Others Based on Review of Patient Reported Information or Presenting Complaint? No ? ?Method: No data recorded ?Availability of Means: No data recorded ?Intent: No data recorded ?Notification Required: No data recorded ?Additional Information for Danger to Others Potential: No data recorded ?Additional Comments for Danger to Others  Potential: No data recorded ?Are There Guns or Other Weapons in [place]? No data recorded ?Types of Guns/Weapons: No data recorded ?Are These Weapons Safely Secured?                            No data recorded ?Who Could Verify You Are Able To Have These Secured: No data recorded ?Do You Have any Outstanding Charges, Pending Court Dates, Parole/Probation? No data recorded ?Contacted To Inform of Risk of Harm To Self or Others: Other: Comment (n/a) ? ? ? ?Does Patient Present under Involuntary Commitment? No ? ?IVC Papers Initial File Date: No data recorded ? ?[place] of Residence: Kathleen Argue ? ? ?Patient Currently Receiving the Following Services: Medication Management ? ? ?Determination of Need: Urgent (48 hours) ? ? ?Options For Referral: Facility-Based Crisis; Chemical Dependency Intensive Outpatient Therapy (CDIOP) ? ? ? ? ?CCA Biopsychosocial ?Patient Reported Schizophrenia/Schizoaffective Diagnosis in Past: No ? ? ?Strengths: Pt is motivated for treatment ? ? ?Mental Health Symptoms ?Depression:   ?Change in energy/activity; Difficulty Concentrating; Fatigue; Hopelessness; Increase/decrease in appetite; Irritability; Sleep (too much or little); Tearfulness; Worthlessness ?  ?Duration of Depressive symptoms:  ?Duration of Depressive Symptoms: Greater than two weeks ?  ?Mania:   ?None ?  ?Anxiety:    ?Difficulty concentrating; Sleep; Tension; Worrying; Fatigue; Irritability ?  ?Psychosis:   ?Delusions; Hallucinations ?  ?Duration of Psychotic symptoms:  ?Duration of Psychotic Symptoms: Greater than six months ?  ?Trauma:   ?  None ?  ?Obsessions:   ?None ?  ?Compulsions:   ?None ?  ?Inattention:   ?None ?  ?Hyperactivity/Impulsivity:   ?None ?  ?Oppositional/Defiant Behaviors:   ?None ?  ?Emotional Irregularity:   ?None ?  ?Other Mood/Personality Symptoms:   ?None ?  ? ?Mental Status Exam ?Appearance and self-care  ?Stature:   ?Average ?  ?Weight:   ?Average weight ?  ?Clothing:   ?Disheveled ?  ?Grooming:    ?Neglected ?  ?Cosmetic use:   ?None ?  ?Posture/gait:   ?Normal ?  ?Motor activity:   ?Not Remarkable ?  ?Sensorium  ?Attention:   ?Normal ?  ?Concentration:   ?Anxiety interferes ?  ?Orientation:   ?X5 ?  ?Recal

## 2021-12-22 NOTE — Progress Notes (Signed)
?   12/22/21 2052  ?[facility] Triage Screening (Walk-ins at [facility] only)  ?How Did You Hear About [place]? Family/Friend  ?What Is the Reason for Your Visit/Call Today? Pt has a diagnosis of substance-induced mood disorder and was discharged from [facility] 12/20/2021. He says he returned to using cocaine, approximately 4 grams per day. He states he has been "scared to death" and paranoid, believing that plain-clothed policemen were following him. He states due to his paranoia he called law enforcement and was charged with felon possession of cocaine and drug paraphernalia. He says he is already on probation. He says he was released and his wife dropped him off at [facility]. He denies current SI, HI but does report hearing voices that berate him.  ?How Long Has This Been Causing You Problems? 1-6 months  ?Have You Recently Had Any Thoughts About Hurting Yourself? No  ?Are You Planning to Commit Suicide/Harm Yourself At This time? No  ?Have you Recently Had Thoughts About Hurting Someone [name]? No  ?Are You Planning To Harm Someone At This Time? No  ?Are you currently experiencing any auditory, visual or other hallucinations? Yes  ?Please explain the hallucinations you are currently experiencing: Hearing voices that berate him.  ?Have You Used Any Alcohol or Drugs in the Past 24 Hours? Yes  ?How long ago did you use Drugs or Alcohol? 4 grams of cocaine this morning.  ?What Did You Use and How Much? Pt reports using 4 grams of cocaine daily.  ?Do you have any current medical co-morbidities that require immediate attention? No  ?Clinician description of patient physical appearance/behavior: Pt is disheveled with bruises on his arms. He is tearful and anxious.  ?What Do You Feel Would Help You the Most Today? Alcohol or Drug Use Treatment;Treatment for Depression or other mood problem;Medication(s)  ?If access to [facility] was not available, would you have sought care in the Emergency Department? Yes  ?Determination of Need Urgent (48  hours)  ?Options For Referral Facility-Based Crisis;Chemical Dependency Intensive Outpatient Therapy (CDIOP)  ? ? ?

## 2021-12-22 NOTE — ED Provider Notes (Signed)
Behavioral Health Admission H&P ?(FBC & OBS) ? ?Date: 12/22/21 ?Patient Name: Randall Parsons ?MRN: [id number] ?Chief Complaint: I want to stop using cocaine ?Chief Complaint  ?Patient presents with  ? Addiction Problem  ? Delusional  ?   ? ?Diagnoses:  ?Final diagnoses:  ?Severe episode of recurrent major depressive disorder, without psychotic features (HCC)  ? ? ?HPI: Randall Parsons is a 41 y/o male with a history of MDD, and polysubstance abuse presenting voluntarily to [facility] for substance use.  Patient was brought to [facility] by his wife.  Patient reports that he is experiencing increased paranoia, hopelessness, worthlessness, feeling unstable and sleep deprived.  Patient reports that he has been using about 400's worth of crack cocaine daily.  Patient reports that he is currently on probation and he was arrested again after calling the police to report someone was following him and was subsequently charged with possession of crack cocaine and drug paraphernalia.  Patient is alert oriented x4 and does not appear to be responding to any internal or external stimuli.  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  Patient also states that he and his wife are separated due to his cocaine use and wants treatment to stop using drugs. Of note patient was just discharged from [facility] on 12/20/21. Patient denies any SI/HI/AVH.  Patient is being recommended for [facility] for safety, stabilization and crisis management. ? ?TTS [name]; Pt is a 41 year old married male who presents to [facility] reporting depressive symptoms, severe anxiety, paranoia, auditory hallucinations, and cocaine use. He has a diagnosis of substance-induced mood disorder and was discharged from [facility] 12/20/2021. He says he returned to using cocaine, approximately 4 grams per day. He states he has been "scared to death" and paranoid, believing that plain-clothed policemen were following him. He states due to his paranoia he called  law enforcement and was charged with felon possession of cocaine and drug paraphernalia. He says he is already on probation. He says he was released and his wife dropped him off at [facility].  ?  ?Pt describes his mood as severely anxious and depressed. Pt acknowledges symptoms including crying spells, social withdrawal, loss of interest in usual pleasures, fatigue, irritability, decreased concentration, insomnia, decreased appetite, and feelings of guilt, worthlessness and hopelessness. He says he has not slept or eaten since leaving [facility]. He reports "seeing people with the same car and the same faces" following him. He says he confronted a stranger and accused him of following him. He says he hears voices berating him. He denies current suicidal ideation or history of suicide attempts. He denies homicidal ideation. He denies use of substances other than cocaine but has a history of using prescription benzodiazepines. ?  ?Pt is disheveled, alert and oriented x4. Pt speaks in a clear tone, at moderate volume and normal pace. Motor behavior appears normal. Eye contact is good and Pt is tearful at times. Pt's mood is depressed and anxious, affect is congruent with mood. Thought process is coherent and relevant. Pt says he needs to be back in treatment. ?  ? ? ? 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 ?  ?  ?Flowsheet  Row ED from 12/17/2021 in [facility] ED from 12/14/2021 in [facility] EMERGENCY DEPARTMENT ED from 12/11/2021 in [facility] EM[facility]ENCY DEPARTMENT  ?C-SSRS RISK CATEGORY No Risk No Risk No Risk  ? ?  ?  ? ?Total Time spent with patient: 30 minutes ? ?Musculoskeletal  ?Strength & Muscle Tone:  within normal limits ?Gait & Station: normal ?Patient leans: N/A ? ?Psychiatric Specialty Exam  ?Presentation ?General Appearance: Casual ? ?Eye Contact:Good ? ?Speech:Clear and Coherent ? ?Speech Volume:Normal ? ?Handedness:Right ? ? ?Mood and Affect  ?Mood:Depressed ? ?Affect:Congruent ? ? ?Thought Process  ?Thought Processes:Coherent ? ?Descriptions of Associations:Intact ? ?Orientation:Full (Time, Place and Person) ? ?Thought Content:WDL ? Diagnosis of Schizophrenia or Schizoaffective disorder in past: No ? Duration of Psychotic Symptoms: Greater than six months ? ?Hallucinations:Hallucinations: None ? ?Ideas of Reference:None ? ?Suicidal Thoughts:Suicidal Thoughts: No ? ?Homicidal Thoughts:Homicidal Thoughts: No ? ? ?Sensorium  ?Memory:Immediate Good; Recent Good; Remote Good ? ?Judgment:Fair ? ?Insight:Fair ? ? ?Executive Functions  ?Concentration:Good ? ?Attention Span:Good ? ?Recall:Good ? ?Fund of Knowledge:Good ? ?Language:Good ? ? ?Psychomotor Activity  ?Psychomotor Activity:Psychomotor Activity: Normal ? ? ?Assets  ?Assets:Communication Skills; Desire for Improvement; Housing; Physical Health; Social Support ? ? ?Sleep  ?Sleep:Sleep: Fair ?Number of Hours of Sleep: -1 ? ? ?Nutritional Assessment (For OBS and FBC admissions only) ?Has the patient had a weight loss or gain of 10 pounds or more in the last 3 months?: No ?Has the patient had a decrease in food intake/or appetite?: No ?Does the patient have dental problems?: No ?Does the patient have eating habits or behaviors that may be indicators of an eating disorder including binging or inducing vomiting?: No ?Has the patient recently lost weight without trying?: 0 ?Has the patient been eating poorly because of a decreased appetite?: 0 ?Malnutrition Screening Tool Score: 0 ? ? ? ?Physical Exam ?Constitutional:   ?   Appearance: Normal appearance.  ?HENT:  ?   Head: Normocephalic and atraumatic.  ?   Nose: Nose normal.  ?Eyes:  ?   Pupils: Pupils are  equal, round, and reactive to light.  ?Cardiovascular:  ?   Rate and Rhythm: Normal rate.  ?Pulmonary:  ?   Effort: Pulmonary effort is normal.  ?Abdominal:  ?   General: Abdomen is flat.  ?Musculoskeletal:     ?   General: Normal range of motion.  ?   Cervical back: Normal range of motion.  ?Skin: ?   General: Skin is warm.  ?Neurological:  ?   General: No focal deficit present.  ?   Mental Status: [name] to person, place, and time.  ?Psychiatric:     ?   Attention and Perception: Attention normal.     ?   Mood and Affect: Mood is depressed. Affect is flat.     ?   Speech: Speech normal.     ?   Behavior: Behavior is cooperative.     ?   Thought Content: Thought content normal. Thought content does not include homicidal or suicidal ideation. Thought content does not include homicidal or suicidal plan.     ?   Cognition and Memory: Cognition normal.     ?   Judgment: Judgment is impulsive.  ? ?Review of Systems  ?Constitutional: Negative.   ?HENT: Negative.    ?Eyes: Negative.   ?Respiratory: Negative.    ?Cardiovascular: Negative.   ?Gastrointestinal: Negative.   ?Genitourinary: Negative.   ?Musculoskeletal:  Negative.   ?Skin: Negative.   ?Neurological: Negative.   ?Endo/Heme/Allergies: Negative.   ?Psychiatric/Behavioral:  Positive for substance abuse.   ? ?Blood pressure 138/89, pulse 67, temperature 98.5 ?F (36.9 ?C), temperature source Oral, resp. rate 18, SpO2 97 %. There is no height or weight on file to calculate BMI. ? ?Past Psychiatric History: ? ?Is the patient at risk to self? No  ?Has the patient been a risk to self in the past 6 months? No .    ?Has the patient been a risk to self within the distant past? No   ?Is the patient a risk to others? No   ?Has the patient been a risk to others in the past 6 months? No   ?Has the patient been a risk to others within the distant past? No  ? ?Past Medical History:  ?Past Medical History:  ?Diagnosis Date  ? CHF (congestive heart failure) (HCC)    ? Exposure to hepatitis B   ? Exposure to hepatitis C   ? Hepatitis C   ? History of opioid abuse (HCC)   ? reports heroin abuse, ending around 2017  ? Hypertension   ? IV drug abuse (HCC)   ? Renal disorde

## 2021-12-22 NOTE — ED Notes (Signed)
Pt brought to obs awaiting PCR COVID results to transfer to [facility]. Pt admitted do to substance use. Pt A&O x4, calm and cooperative. Pt denies SI/HI/AVH. Pt tolerated lab work and skin assessment well. Pt ambulated independently to unit. Oriented to unit/staff. PB&J, cheese stick, and apple juice given per pt request. No signs of acute distress noted. Will continue to monitor for safety.  ?

## 2021-12-22 NOTE — Progress Notes (Signed)
Formatting of this note is different from the original.     12/22/21 2052   [facility] Triage Screening (Walk-ins at [facility] only)   How Did You Hear About [place]? Family/Friend   What Is the Reason for Your Visit/Call Today? Pt has a diagnosis of substance-induced mood disorder and was discharged from [facility] 12/20/2021. He says he returned to using cocaine, approximately 4 grams per day. He states he has been "scared to death" and paranoid, believing that plain-clothed policemen were following him. He states due to his paranoia he called law enforcement and was charged with felon possession of cocaine and drug paraphernalia. He says he is already on probation. He says he was released and his wife dropped him off at [facility]. He denies current SI, HI but does report hearing voices that berate him.   How Long Has This Been Causing You Problems? 1-6 months   Have You Recently Had Any Thoughts About Hurting Yourself? No   Are You Planning to Commit Suicide/Harm Yourself At This time? No   Have you Recently Had Thoughts About Hurting Someone [name]? No   Are You Planning To Harm Someone At This Time? No   Are you currently experiencing any auditory, visual or other hallucinations? Yes   Please explain the hallucinations you are currently experiencing: Hearing voices that berate him.   Have You Used Any Alcohol or Drugs in the Past 24 Hours? Yes   How long ago did you use Drugs or Alcohol? 4 grams of cocaine this morning.   What Did You Use and How Much? Pt reports using 4 grams of cocaine daily.   Do you have any current medical co-morbidities that require immediate attention? No   Clinician description of patient physical appearance/behavior: Pt is disheveled with bruises on his arms. He is tearful and anxious.   What Do You Feel Would Help You the Most Today? Alcohol or Drug Use Treatment;Treatment for Depression or other mood problem;Medication(s)   If access to [facility]gent [facility] was not available, would you have sought care in the Emergency  Department? Yes   Determination of Need Urgent (48 hours)   Options For Referral Facility-Based Crisis;Chemical Dependency Intensive Outpatient Therapy (CDIOP)       Electronically signed by Germain Osgood, [facility] at 12/22/2021 10:11 PM EDT

## 2021-12-22 NOTE — ED Provider Notes (Signed)
Formatting of this note is different from the original.  Behavioral Health Admission H&P  [facility] & OBS)    Date: 12/22/21  Patient Name: Lucas Hicks  MRN: [id number]  Chief Complaint: I want to stop using cocaine  Chief Complaint   Patient presents with    Addiction Problem    Delusional       Diagnoses:   Final diagnoses:   Severe episode of recurrent major depressive disorder, without psychotic features (HCC)     HPI: [name] is a 41 y/o male with a history of MDD, and polysubstance abuse presenting voluntarily to [facility] for substance use.  Patient was brought to [facility] by his wife.  Patient reports that he is experiencing increased paranoia, hopelessness, worthlessness, feeling unstable and sleep deprived.  Patient reports that he has been using about 400's worth of crack cocaine daily.  Patient reports that he is currently on probation and he was arrested again after calling the police to report someone was following him and was subsequently charged with possession of crack cocaine and drug paraphernalia.  Patient is alert oriented x4 and does not appear to be responding to any internal or external stimuli.  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  Patient also states that he and his wife are separated due to his cocaine use and wants treatment to stop using drugs. Of note patient was just discharged from [facility] on 12/20/21. Patient denies any SI/HI/AVH.  Patient is being recommended for [facility] for safety, stabilization and crisis management.    TTS [name]; Pt is a 41 year old married male who presents to [facility] reporting depressive symptoms, severe anxiety, paranoia, auditory hallucinations, and cocaine use. He has a diagnosis of substance-induced mood disorder and was discharged from [facility] 12/20/2021. He says he returned to using cocaine, approximately 4 grams per day. He states he has been "scared to death" and paranoid, believing that plain-clothed policemen were  following him. He states due to his paranoia he called law enforcement and was charged with felon possession of cocaine and drug paraphernalia. He says he is already on probation. He says he was released and his wife dropped him off at [facility].     Pt describes his mood as severely anxious and depressed. Pt acknowledges symptoms including crying spells, social withdrawal, loss of interest in usual pleasures, fatigue, irritability, decreased concentration, insomnia, decreased appetite, and feelings of guilt, worthlessness and hopelessness. He says he has not slept or eaten since leaving [facility]. He reports "seeing people with the same car and the same faces" following him. He says he confronted a stranger and accused him of following him. He says he hears voices berating him. He denies current suicidal ideation or history of suicide attempts. He denies homicidal ideation. He denies use of substances other than cocaine but has a history of using prescription benzodiazepines.    Pt is disheveled, alert and oriented x4. Pt speaks in a clear tone, at moderate volume and normal pace. Motor behavior appears normal. Eye contact is good and Pt is tearful at times. Pt's mood is depressed and anxious, affect is congruent with mood. Thought process is coherent and relevant. Pt says he needs to be back in treatment.      PHQ 2-9:   [employer] Visit from 08/03/2021 in [facility] ED from 12/03/2020 in [facility]   Thoughts that you would be better  off dead, or of hurting yourself in some way Not at all More than half the days   PHQ-9 Total Score 3 18         Flowsheet Row ED from 12/17/2021 in [facility] ED from 12/14/2021 in [facility] EMERGENCY DEPARTMENT ED from 12/11/2021 in [facility]   C-SSRS RISK CATEGORY No Risk No Risk No Risk           Total Time spent with patient: 30  minutes    Musculoskeletal   Strength & Muscle Tone: within normal limits  Gait & Station: normal  Patient leans: N/A    Psychiatric Specialty Exam   Presentation  General Appearance: Casual    Eye Contact:Good    Speech:Clear and Coherent    Speech Volume:Normal    Handedness:Right    Mood and Affect   Mood:Depressed    Affect:Congruent    Thought Process   Thought Processes:Coherent    Descriptions of Associations:Intact    Orientation:Full (Time, Place and Person)    Thought Content:WDL   Diagnosis of Schizophrenia or Schizoaffective disorder in past: No   Duration of Psychotic Symptoms: Greater than six months    Hallucinations:Hallucinations: None    Ideas of Reference:None    Suicidal Thoughts:Suicidal Thoughts: No    Homicidal Thoughts:Homicidal Thoughts: No    Sensorium   Memory:Immediate Good; Recent Good; Remote Good    Judgment:Fair    Insight:Fair    Executive Functions   Concentration:Good    Attention Span:Good    Recall:Good    Fund of Knowledge:Good    Language:Good    Psychomotor Activity   Psychomotor Activity:Psychomotor Activity: Normal    Assets   Assets:Communication Skills; Desire for Improvement; Housing; Physical Health; Social Support    Sleep   Sleep:Sleep: Fair  Number of Hours of Sleep: -1    Nutritional Assessment (For OBS and [facility] admissions only)  Has the patient had a weight loss or gain of 10 pounds or more in the last 3 months?: No  Has the patient had a decrease in food intake/or appetite?: No  Does the patient have dental problems?: No  Does the patient have eating habits or behaviors that may be indicators of an eating disorder including binging or inducing vomiting?: No  Has the patient recently lost weight without trying?: 0  Has the patient been eating poorly because of a decreased appetite?: 0  Malnutrition Screening Tool Score: 0    Physical Exam  Constitutional:       Appearance: Normal appearance.   HENT:      Head: Normocephalic and atraumatic.      Nose: Nose normal.    Eyes:      Pupils: Pupils are equal, round, and reactive to light.   Cardiovascular:      Rate and Rhythm: Normal rate.   Pulmonary:      Effort: Pulmonary effort is normal.   Abdominal:      General: Abdomen is flat.   Musculoskeletal:         General: Normal range of motion.      Cervical back: Normal range of motion.   Skin:     General: Skin is warm.   Neurological:      General: No focal deficit present.      Mental Status: [name] to person, place, and time.   Psychiatric:         Attention and  Perception: Attention normal.         Mood and Affect: Mood is depressed. Affect is flat.         Speech: Speech normal.         Behavior: Behavior is cooperative.         Thought Content: Thought content normal. Thought content does not include homicidal or suicidal ideation. Thought content does not include homicidal or suicidal plan.         Cognition and Memory: Cognition normal.         Judgment: Judgment is impulsive.     Review of Systems   Constitutional: Negative.    HENT: Negative.     Eyes: Negative.    Respiratory: Negative.     Cardiovascular: Negative.    Gastrointestinal: Negative.    Genitourinary: Negative.    Musculoskeletal: Negative.    Skin: Negative.    Neurological: Negative.    Endo/Heme/Allergies: Negative.    Psychiatric/Behavioral:  Positive for substance abuse.      Blood pressure 138/89, pulse 67, temperature 98.5 F (36.9 C), temperature source Oral, resp. rate 18, SpO2 97 %. There is no height or weight on file to calculate BMI.    Past Psychiatric History:    Is the patient at risk to self? No   Has the patient been a risk to self in the past 6 months? No .     Has the patient been a risk to self within the distant past? No    Is the patient a risk to others? No    Has the patient been a risk to others in the past 6 months? No    Has the patient been a risk to others within the distant past? No     Past Medical History:   Past Medical History:   Diagnosis Date    CHF  (congestive heart failure) (HCC)     Exposure to hepatitis B     Exposure to hepatitis C     Hepatitis C     History of opioid abuse (HCC)     reports heroin abuse, ending around 2017    Hypertension     IV drug abuse (HCC)     Renal disorder     kidney stones     Past Surgical History:   Procedure Laterality Date    APPENDECTOMY      EYE SURGERY      secondary to dog bite    TIBIA FRACTURE SURGERY Right      Family History:   Family History   Problem Relation Age of Onset    Alcohol abuse Father     Melanoma Mother     Breast cancer Mother     Colon cancer Neg Hx     Prostate cancer Neg Hx      Social History:   Social History     Socioeconomic History    Marital status: Married     Spouse name: Not on file    Number of children: Not on file    Years of education: Not on file    Highest education level: Not on file   Occupational History    Not on file   Tobacco Use    Smoking status: Former    Smokeless tobacco: Never   Vaping Use    Vaping Use: Every day   Substance and Sexual Activity    Alcohol use: No    Drug use: Not Currently  Types: IV, Cocaine     Comment: hx of heroin abuse (opioid addiction), crack    Sexual activity: Not on file   Other Topics Concern    Not on file   Social History Narrative    Working at [employer] in [place].  Married, has 5 step kids (2 at home).  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      Social Determinants of Health     Financial Resource Strain: Not on file   Food Insecurity: Not on file   Transportation Needs: Not on file   Physical Activity: Not on file   Stress: Not on file   Social Connections: Not on file   Intimate Partner Violence: Not on file     SDOH:   SDOH Screenings     Alcohol Screen: Not on file   Depression (PHQ2-9): Low Risk     PHQ-2 Score: 3   Financial Resource Strain: Not on file   Food Insecurity: Not on file   Housing: Not on file   Physical Activity: Not on file   Social Connections: Not on file   Stress: Not on file   Tobacco Use: Medium Risk    Smoking  Tobacco Use: Former    Smokeless Tobacco Use: Never    Passive Exposure: Not on file   Transportation Needs: Not on file     Last Labs:   Admission on 12/17/2021, Discharged on 12/20/2021   Component Date Value Ref Range Status    SARS Coronavirus 2 by RT PCR 12/17/2021 NEGATIVE  NEGATIVE Final    Comment: (NOTE)  SARS-CoV-2 target nucleic acids are NOT DETECTED.    The SARS-CoV-2 RNA is generally detectable in upper respiratory  specimens during the acute phase of infection. The lowest  concentration of SARS-CoV-2 viral copies this assay can detect is  138 copies/mL. A negative result does not preclude SARS-Cov-2  infection and should not be used as the sole basis for treatment or  other patient management decisions. A negative result may occur with   improper specimen collection/handling, submission of specimen other  than nasopharyngeal swab, presence of viral mutation(s) within the  areas targeted by this assay, and inadequate number of viral  copies(<138 copies/mL). A negative result must be combined with  clinical observations, patient history, and epidemiological  information. The expected result is Negative.    Fact Sheet for Patients:   BloggerCourse.com    Fact Sheet for Healthcare Providers:   SeriousBroker.it    This test is no                           t yet approved or cleared by the [place] FDA and   has been authorized for detection and/or diagnosis of SARS-CoV-2 by  FDA under an Emergency Use Authorization (EUA). This EUA will remain   in effect (meaning this test can be used) for the duration of the  COVID-19 declaration under Section 564(b)(1) of the Act, 21  U.S.C.section 360bbb-3(b)(1), unless the authorization is terminated   or revoked sooner.        Influenza A by PCR 12/17/2021 NEGATIVE  NEGATIVE Final    Influenza B by PCR 12/17/2021 NEGATIVE  NEGATIVE Final    Comment: (NOTE)  The Xpert Xpress SARS-CoV-2/FLU/RSV plus assay is intended  as an aid  in the diagnosis of influenza from Nasopharyngeal swab specimens and  should not be used as a sole basis for treatment. Nasal washings and  aspirates are unacceptable for Xpert Xpress SARS-CoV-2/FLU/RSV  testing.    Fact Sheet for Patients:  BloggerCourse.com    Fact Sheet for Healthcare Providers:  SeriousBroker.it    This test is not yet approved or cleared by the [place] FDA and  has been authorized for detection and/or diagnosis of SARS-CoV-2 by  FDA under an Emergency Use Authorization (EUA). This EUA will remain  in effect (meaning this test can be used) for the duration of the  COVID-19 declaration under Section 564(b)(1) of the Act, 21 U.S.C.  section 360bbb-3(b)(1), unless the authorization is terminated or  revoked.    Performed at [facility], [address], [place], [place]  [address]     SARS Coronavirus 2 Ag 12/17/2021 Negative  Negative Preliminary    WBC 12/17/2021 4.9  4.0 - 10.5 K/uL Final    RBC 12/17/2021 4.63  4.22 - 5.81 MIL/uL Final    Hemoglobin 12/17/2021 13.9  13.0 - 17.0 g/dL Final    HCT 98/08/9146 43.2  39.0 - 52.0 % Final    MCV 12/17/2021 93.3  80.0 - 100.0 fL Final    MCH 12/17/2021 30.0  26.0 - 34.0 pg Final    MCHC 12/17/2021 32.2  30.0 - 36.0 g/dL Final    RDW 82/95/6213 13.2  11.5 - 15.5 % Final    Platelets 12/17/2021 282  150 - 400 K/uL Final    nRBC 12/17/2021 0.0  0.0 - 0.2 % Final    Neutrophils Relative % 12/17/2021 42  % Final    Neutro Abs 12/17/2021 2.1  1.7 - 7.7 K/uL Final    Lymphocytes Relative 12/17/2021 37  % Final    Lymphs Abs 12/17/2021 1.8  0.7 - 4.0 K/uL Final    Monocytes Relative 12/17/2021 14  % Final    Monocytes Absolute 12/17/2021 0.7  0.1 - 1.0 K/uL Final    Eosinophils Relative 12/17/2021 6  % Final    Eosinophils Absolute 12/17/2021 0.3  0.0 - 0.5 K/uL Final    Basophils Relative 12/17/2021 1  % Final    Basophils Absolute 12/17/2021 0.0  0.0 - 0.1 K/uL Final    Immature  Granulocytes 12/17/2021 0  % Final    Abs Immature Granulocytes 12/17/2021 0.00  0.00 - 0.07 K/uL Final    Performed at [facility], [address], [place], [place] [address]    Sodium 12/17/2021 139  135 - 145 mmol/L Final    Potassium 12/17/2021 4.2  3.5 - 5.1 mmol/L Final    Chloride 12/17/2021 101  98 - 111 mmol/L Final    CO2 12/17/2021 30  22 - 32 mmol/L Final    Glucose, Bld 12/17/2021 111 (H)  70 - 99 mg/dL Final    Glucose reference range applies only to samples taken after fasting for at least 8 hours.    BUN 12/17/2021 12  6 - 20 mg/dL Final    Creatinine, Ser 12/17/2021 0.96  0.61 - 1.24 mg/dL Final    Calcium 84/69/6295 9.6  8.9 - 10.3 mg/dL Final    Total Protein 12/17/2021 6.7  6.5 - 8.1 g/dL Final    Albumin 28/41/3244 4.0  3.5 - 5.0 g/dL Final    AST 10/09/7251 34  15 - 41 U/L Final    ALT 12/17/2021 34  0 - 44 U/L Final    Alkaline Phosphatase 12/17/2021 75  38 - 126 U/L Final    Total Bilirubin 12/17/2021 0.5  0.3 -  1.2 mg/dL Final    GFR, Estimated 12/17/2021 >60  >60 mL/min Final    Comment: (NOTE)  Calculated using the CKD-EPI Creatinine Equation (2021)     Anion gap 12/17/2021 8  5 - 15 Final    Performed at [facility], [address] [place], [place] [address]    Hgb A1c MFr Bld 12/17/2021 5.9 (H)  4.8 - 5.6 % Final    Comment: (NOTE)          Prediabetes: 5.7 - 6.4          Diabetes: >6.4          Glycemic control for adults with diabetes: <7.0     Mean Plasma Glucose 12/17/2021 123  mg/dL Final    Comment: (NOTE)  Performed At: [facility]  [address] [place], [place] [id number]  Jolene Schimke MD EX:[id number]     Alcohol, Ethyl (B) 12/17/2021 <10  <10 mg/dL Final    Comment: (NOTE)  Lowest detectable limit for serum alcohol is 10 mg/dL.    For medical purposes only.  Performed at [facility], [address] [place], [place]  [address]     TSH 12/17/2021 0.686  0.350 - 4.500 uIU/mL Final    Comment: Performed by a 3rd Generation assay with a functional  sensitivity of <=0.01 uIU/mL.  Performed at [facility], [address] [place], [place] [address]     POC Amphetamine UR 12/18/2021 None Detected  NONE DETECTED (Cut Off Level 1000 ng/mL) Final    POC Secobarbital (BAR) 12/18/2021 None Detected  NONE DETECTED (Cut Off Level 300 ng/mL) Final    POC Buprenorphine (BUP) 12/18/2021 Positive (A)  NONE DETECTED (Cut Off Level 10 ng/mL) Final    POC Oxazepam (BZO) 12/18/2021 Positive (A)  NONE DETECTED (Cut Off Level 300 ng/mL) Final    POC Cocaine UR 12/18/2021 Positive (A)  NONE DETECTED (Cut Off Level 300 ng/mL) Final    POC Methamphetamine UR 12/18/2021 None Detected  NONE DETECTED (Cut Off Level 1000 ng/mL) Final    POC Morphine 12/18/2021 None Detected  NONE DETECTED (Cut Off Level 300 ng/mL) Final    POC Oxycodone UR 12/18/2021 None Detected  NONE DETECTED (Cut Off Level 100 ng/mL) Final    POC Methadone UR 12/18/2021 None Detected  NONE DETECTED (Cut Off Level 300 ng/mL) Final    POC Marijuana UR 12/18/2021 Positive (A)  NONE DETECTED (Cut Off Level 50 ng/mL) Final    Cholesterol 12/17/2021 175  0 - 200 mg/dL Final    Triglycerides 12/17/2021 127  <150 mg/dL Final    HDL 40/34/7425 50  >40 mg/dL Final    Total CHOL/HDL Ratio 12/17/2021 3.5  RATIO Final    VLDL 12/17/2021 25  0 - 40 mg/dL Final    LDL Cholesterol 12/17/2021 100 (H)  0 - 99 mg/dL Final    Comment:         Total Cholesterol/HDL:CHD Risk  Coronary Heart Disease Risk Table                      Men   Women   1/2 Average Risk   3.4   3.3   Average Risk       5.0   4.4   2 X Average Risk   9.6   7.1   3 X Average Risk  23.4   11.0    Use the calculated Patient Ratio  above  and the CHD Risk Table  to determine the patient's CHD Risk.    ATP III CLASSIFICATION (LDL):   <100     mg/dL   Optimal   161-096  mg/dL   Near or Above                     Optimal   130-159  mg/dL   Borderline   045-409  mg/dL   High   >811     mg/dL   Very High  Performed at [facility], [address]  [place], [place] [address]     Color, Urine 12/19/2021 YELLOW  YELLOW Final    APPearance 12/19/2021 CLEAR  CLEAR Final    Specific Gravity, Urine 12/19/2021 1.008  1.005 - 1.030 Final    pH 12/19/2021 6.0  5.0 - 8.0 Final    Glucose, UA 12/19/2021 NEGATIVE  NEGATIVE mg/dL Final    Hgb urine dipstick 12/19/2021 NEGATIVE  NEGATIVE Final    Bilirubin Urine 12/19/2021 NEGATIVE  NEGATIVE Final    Ketones, ur 12/19/2021 NEGATIVE  NEGATIVE mg/dL Final    Protein, ur 29/56/2130 NEGATIVE  NEGATIVE mg/dL Final    Nitrite 86/57/8469 NEGATIVE  NEGATIVE Final    Leukocytes,Ua 12/19/2021 TRACE (A)  NEGATIVE Final    RBC / HPF 12/19/2021 0-5  0 - 5 RBC/hpf Final    WBC, UA 12/19/2021 0-5  0 - 5 WBC/hpf Final    Bacteria, UA 12/19/2021 NONE SEEN  NONE SEEN Final    Squamous Epithelial / LPF 12/19/2021 0-5  0 - 5 Final    Performed at [facility], [address], [place], [place] [address]   Admission on 12/14/2021, Discharged on 12/15/2021   Component Date Value Ref Range Status    SARS Coronavirus 2 by RT PCR 12/14/2021 NEGATIVE  NEGATIVE Final    Comment: (NOTE)  SARS-CoV-2 target nucleic acids are NOT DETECTED.    The SARS-CoV-2 RNA is generally detectable in upper respiratory  specimens during the acute phase of infection. The lowest  concentration of SARS-CoV-2 viral copies this assay can detect is  138 copies/mL. A negative result does not preclude SARS-Cov-2  infection and should not be used as the sole basis for treatment or  other patient management decisions. A negative result may occur with   improper specimen collection/handling, submission of specimen other  than nasopharyngeal swab, presence of viral mutation(s) within the  areas targeted by this assay, and inadequate number of viral  copies(<138 copies/mL). A negative result must be combined with  clinical observations, patient history, and epidemiological  information. The expected result is Negative.    Fact Sheet for Patients:    BloggerCourse.com    Fact Sheet for Healthcare Providers:   SeriousBroker.it    This test is no                           t yet approved or cleared by the [place] FDA and   has been authorized for detection and/or diagnosis of SARS-CoV-2 by  FDA under an Emergency Use Authorization (EUA). This EUA will remain   in effect (meaning this test can be used) for the duration of the  COVID-19 declaration under Section 564(b)(1) of the Act, 21  U.S.C.section 360bbb-3(b)(1), unless the authorization is terminated   or revoked sooner.        Influenza A by PCR 12/14/2021 NEGATIVE  NEGATIVE Final  Influenza B by PCR 12/14/2021 NEGATIVE  NEGATIVE Final    Comment: (NOTE)  The Xpert Xpress SARS-CoV-2/FLU/RSV plus assay is intended as an aid  in the diagnosis of influenza from Nasopharyngeal swab specimens and  should not be used as a sole basis for treatment. Nasal washings and  aspirates are unacceptable for Xpert Xpress SARS-CoV-2/FLU/RSV  testing.    Fact Sheet for Patients:  BloggerCourse.com    Fact Sheet for Healthcare Providers:  SeriousBroker.it    This test is not yet approved or cleared by the [place] FDA and  has been authorized for detection and/or diagnosis of SARS-CoV-2 by  FDA under an Emergency Use Authorization (EUA). This EUA will remain  in effect (meaning this test can be used) for the duration of the  COVID-19 declaration under Section 564(b)(1) of the Act, 21 U.S.C.  section 360bbb-3(b)(1), unless the authorization is terminated or  revoked.    Performed at [facility], [address], [place], [place]  [address]     Sodium 12/14/2021 141  135 - 145 mmol/L Final    Potassium 12/14/2021 3.5  3.5 - 5.1 mmol/L Final    Chloride 12/14/2021 103  98 - 111 mmol/L Final    CO2 12/14/2021 29  22 - 32 mmol/L Final    Glucose, Bld 12/14/2021 105 (H)  70 - 99 mg/dL Final    Glucose reference range  applies only to samples taken after fasting for at least 8 hours.    BUN 12/14/2021 7  6 - 20 mg/dL Final    Creatinine, Ser 12/14/2021 1.13  0.61 - 1.24 mg/dL Final    Calcium 60/45/4098 9.0  8.9 - 10.3 mg/dL Final    Total Protein 12/14/2021 6.7  6.5 - 8.1 g/dL Final    Albumin 11/91/4782 4.1  3.5 - 5.0 g/dL Final    AST 95/62/1308 47 (H)  15 - 41 U/L Final    ALT 12/14/2021 43  0 - 44 U/L Final    Alkaline Phosphatase 12/14/2021 69  38 - 126 U/L Final    Total Bilirubin 12/14/2021 0.6  0.3 - 1.2 mg/dL Final    GFR, Estimated 12/14/2021 >60  >60 mL/min Final    Comment: (NOTE)  Calculated using the CKD-EPI Creatinine Equation (2021)     Anion gap 12/14/2021 9  5 - 15 Final    Performed at [facility], [address], [place], [place] [address]    Alcohol, Ethyl (B) 12/14/2021 <10  <10 mg/dL Final    Comment: (NOTE)  Lowest detectable limit for serum alcohol is 10 mg/dL.    For medical purposes only.  Performed at [facility], [address], [place], [place]  [address]     WBC 12/14/2021 6.3  4.0 - 10.5 K/uL Final    RBC 12/14/2021 4.71  4.22 - 5.81 MIL/uL Final    Hemoglobin 12/14/2021 14.0  13.0 - 17.0 g/dL Final    HCT 52/84/1324 43.8  39.0 - 52.0 % Final    MCV 12/14/2021 93.0  80.0 - 100.0 fL Final    MCH 12/14/2021 29.7  26.0 - 34.0 pg Final    MCHC 12/14/2021 32.0  30.0 - 36.0 g/dL Final    RDW 40/07/2724 12.9  11.5 - 15.5 % Final    Platelets 12/14/2021 254  150 - 400 K/uL Final    nRBC 12/14/2021 0.0  0.0 - 0.2 % Final    Neutrophils Relative % 12/14/2021 65  %  Final    Neutro Abs 12/14/2021 4.2  1.7 - 7.7 K/uL Final    Lymphocytes Relative 12/14/2021 21  % Final    Lymphs Abs 12/14/2021 1.3  0.7 - 4.0 K/uL Final    Monocytes Relative 12/14/2021 10  % Final    Monocytes Absolute 12/14/2021 0.7  0.1 - 1.0 K/uL Final    Eosinophils Relative 12/14/2021 3  % Final    Eosinophils Absolute 12/14/2021 0.2  0.0 - 0.5 K/uL Final    Basophils Relative 12/14/2021 1  % Final    Basophils Absolute  12/14/2021 0.0  0.0 - 0.1 K/uL Final    Immature Granulocytes 12/14/2021 0  % Final    Abs Immature Granulocytes 12/14/2021 0.01  0.00 - 0.07 K/uL Final    Performed at [facility], [address] [address], [place], [place] [address]    Acetaminophen (Tylenol), Serum 12/14/2021 <10 (L)  10 - 30 ug/mL Final    Comment: (NOTE)  Therapeutic concentrations vary significantly. A range of 10-30 ug/mL   may be an effective concentration for many patients. However, some   are best treated at concentrations outside of this range.  Acetaminophen concentrations >150 ug/mL at 4 hours after ingestion   and >50 ug/mL at 12 hours after ingestion are often associated with   toxic reactions.    Performed at [facility], [address], [place], [place]  [address]     Salicylate Lvl 12/14/2021 <7.0 (L)  7.0 - 30.0 mg/dL Final    Performed at [facility], [address] [place], [place] [address]   Admission on 09/19/2021, Discharged on 09/19/2021   Component Date Value Ref Range Status    Sodium 09/19/2021 139  135 - 145 mmol/L Final    Potassium 09/19/2021 4.3  3.5 - 5.1 mmol/L Final    Chloride 09/19/2021 106  98 - 111 mmol/L Final    CO2 09/19/2021 27  22 - 32 mmol/L Final    Glucose, Bld 09/19/2021 88  70 - 99 mg/dL Final    Glucose reference range applies only to samples taken after fasting for at least 8 hours.    BUN 09/19/2021 17  6 - 20 mg/dL Final    Creatinine, Ser 09/19/2021 1.01  0.61 - 1.24 mg/dL Final    Calcium 91/47/8295 9.2  8.9 - 10.3 mg/dL Final    Total Protein 09/19/2021 7.1  6.5 - 8.1 g/dL Final    Albumin 62/13/0865 4.1  3.5 - 5.0 g/dL Final    AST 78/46/9629 34  15 - 41 U/L Final    ALT 09/19/2021 27  0 - 44 U/L Final    Alkaline Phosphatase 09/19/2021 72  38 - 126 U/L Final    Total Bilirubin 09/19/2021 0.8  0.3 - 1.2 mg/dL Final    GFR, Estimated 09/19/2021 >60  >60 mL/min Final    Comment: (NOTE)  Calculated using the CKD-EPI Creatinine Equation (2021)     Anion gap 09/19/2021 6  5 - 15 Final     Performed at [facility], [address] [place], [place] [address]    WBC 09/19/2021 4.5  4.0 - 10.5 K/uL Final    RBC 09/19/2021 4.70  4.22 - 5.81 MIL/uL Final    Hemoglobin 09/19/2021 14.2  13.0 - 17.0 g/dL Final    HCT 32/44/0102 42.9  39.0 - 52.0 % Final    MCV 09/19/2021 91.3  80.0 - 100.0 fL Final    MCH  09/19/2021 30.2  26.0 - 34.0 pg Final    MCHC 09/19/2021 33.1  30.0 - 36.0 g/dL Final    RDW 16/07/9603 13.4  11.5 - 15.5 % Final    Platelets 09/19/2021 262  150 - 400 K/uL Final    nRBC 09/19/2021 0.0  0.0 - 0.2 % Final    Performed at [facility], [address] [place], [place] [address]    Color, Urine 09/19/2021 YELLOW  YELLOW Final    YELLOW    APPearance 09/19/2021 CLEAR  CLEAR Final    CLEAR    Specific Gravity, Urine 09/19/2021 1.020  1.005 - 1.030 Final    pH 09/19/2021 5.5  5.0 - 8.0 Final    Glucose, UA 09/19/2021 NEGATIVE  NEGATIVE mg/dL Final    Hgb urine dipstick 09/19/2021 NEGATIVE  NEGATIVE Final    Bilirubin Urine 09/19/2021 NEGATIVE  NEGATIVE Final    Ketones, ur 09/19/2021 NEGATIVE  NEGATIVE mg/dL Final    Protein, ur 11/91/4782 NEGATIVE  NEGATIVE mg/dL Final    Nitrite 95/62/1308 NEGATIVE  NEGATIVE Final    Leukocytes,Ua 09/19/2021 NEGATIVE  NEGATIVE Final    Comment: Microscopic not done on urines with negative protein, blood, leukocytes, nitrite, or glucose < 500 mg/dL.  Performed at [facility], [address] [place], [place] [address]    Admission on 07/02/2021, Discharged on 07/02/2021   Component Date Value Ref Range Status    SARS Coronavirus 2 by RT PCR 07/02/2021 NEGATIVE  NEGATIVE Final    Comment: (NOTE)  SARS-CoV-2 target nucleic acids are NOT DETECTED.    The SARS-CoV-2 RNA is generally detectable in upper respiratory  specimens during the acute phase of infection. The lowest  concentration of SARS-CoV-2 viral copies this assay can detect is  138 copies/mL. A negative result does not preclude SARS-Cov-2  infection and should not be  used as the sole basis for treatment or  other patient management decisions. A negative result may occur with   improper specimen collection/handling, submission of specimen other  than nasopharyngeal swab, presence of viral mutation(s) within the  areas targeted by this assay, and inadequate number of viral  copies(<138 copies/mL). A negative result must be combined with  clinical observations, patient history, and epidemiological  information. The expected result is Negative.    Fact Sheet for Patients:   BloggerCourse.com    Fact Sheet for Healthcare Providers:   SeriousBroker.it    This test is no                           t yet approved or cleared by the [place] FDA and   has been authorized for detection and/or diagnosis of SARS-CoV-2 by  FDA under an Emergency Use Authorization (EUA). This EUA will remain   in effect (meaning this test can be used) for the duration of the  COVID-19 declaration under Section 564(b)(1) of the Act, 21  U.S.C.section 360bbb-3(b)(1), unless the authorization is terminated   or revoked sooner.        Influenza A by PCR 07/02/2021 NEGATIVE  NEGATIVE Final    Influenza B by PCR 07/02/2021 NEGATIVE  NEGATIVE Final    Comment: (NOTE)  The Xpert Xpress SARS-CoV-2/FLU/RSV plus assay is intended as an aid  in the diagnosis of influenza from Nasopharyngeal swab specimens and  should not be used as a sole basis for treatment. Nasal washings and  aspirates are unacceptable for Xpert Xpress SARS-CoV-2/FLU/RSV  testing.    Fact Sheet for Patients:  BloggerCourse.com    Fact Sheet for Healthcare Providers:  SeriousBroker.it    This test is not yet approved or cleared by the [place] FDA and  has been authorized for detection and/or diagnosis of SARS-CoV-2 by  FDA under an Emergency Use Authorization (EUA). This EUA will remain  in effect (meaning this test can be used) for the duration  of the  COVID-19 declaration under Section 564(b)(1) of the Act, 21 U.S.C.  section 360bbb-3(b)(1), unless the authorization is terminated or  revoked.    Performed at [facility], [address] [place],  [place] [address]      Allergies: Amoxicillin and Prednisone    PTA Medications: (Not in a hospital admission)    Medical Decision Making   [name]; Pt is a 41 year old married male who presents to [facility] reporting depressive symptoms, severe anxiety, paranoia, auditory hallucinations, and cocaine use.       Recommendations   Based on my evaluation the patient does not appear to have an emergency medical condition. Patient will be admitted to [facility] to detox for safety and crisis management.     Jasper Riling, NP  12/22/21  10:18 PM  Electronically signed by Nelly Rout, MD at 12/25/2021  3:32 PM EDT    Associated attestation - Nelly Rout, MD - 12/25/2021  3:32 PM EDT  Formatting of this note might be different from the original.  Patient admitted to [facility] for stabilization and treatment

## 2021-12-22 NOTE — ED Notes (Signed)
Formatting of this note might be different from the original.  Pt brought to obs awaiting PCR COVID results to transfer to [facility]. Pt admitted do to substance use. Pt A&O x4, calm and cooperative. Pt denies SI/HI/AVH. Pt tolerated lab work and skin assessment well. Pt ambulated independently to unit. Oriented to unit/staff. PB&J, cheese stick, and apple juice given per pt request. No signs of acute distress noted. Will continue to monitor for safety.   Electronically signed by Sunday Corn, RN at 12/22/2021 11:55 PM EDT
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
## 2021-12-23 DIAGNOSIS — F1414 Cocaine abuse with cocaine-induced mood disorder: Secondary | ICD-10-CM | POA: Diagnosis not present

## 2021-12-23 LAB — CBC WITH DIFFERENTIAL/PLATELET
Abs Immature Granulocytes: 0.01 10*3/uL (ref 0.00–0.07)
Basophils Absolute: 0 10*3/uL (ref 0.0–0.1)
Basophils Relative: 0 %
Eosinophils Absolute: 0.1 10*3/uL (ref 0.0–0.5)
Eosinophils Relative: 2 %
HCT: 43.4 % (ref 39.0–52.0)
Hemoglobin: 14.1 g/dL (ref 13.0–17.0)
Immature Granulocytes: 0 %
Lymphocytes Relative: 29 %
Lymphs Abs: 1.7 10*3/uL (ref 0.7–4.0)
MCH: 29.7 pg (ref 26.0–34.0)
MCHC: 32.5 g/dL (ref 30.0–36.0)
MCV: 91.6 fL (ref 80.0–100.0)
Monocytes Absolute: 1 10*3/uL (ref 0.1–1.0)
Monocytes Relative: 17 %
Neutro Abs: 3.1 10*3/uL (ref 1.7–7.7)
Neutrophils Relative %: 52 %
Platelets: 263 10*3/uL (ref 150–400)
RBC: 4.74 MIL/uL (ref 4.22–5.81)
RDW: 13.1 % (ref 11.5–15.5)
WBC: 6 10*3/uL (ref 4.0–10.5)
nRBC: 0 % (ref 0.0–0.2)

## 2021-12-23 LAB — COMPREHENSIVE METABOLIC PANEL
ALT: 44 U/L (ref 0–44)
AST: 56 U/L — ABNORMAL HIGH (ref 15–41)
Albumin: 4.4 g/dL (ref 3.5–5.0)
Alkaline Phosphatase: 73 U/L (ref 38–126)
Anion gap: 11 (ref 5–15)
BUN: 16 mg/dL (ref 6–20)
CO2: 29 mmol/L (ref 22–32)
Calcium: 9.5 mg/dL (ref 8.9–10.3)
Chloride: 97 mmol/L — ABNORMAL LOW (ref 98–111)
Creatinine, Ser: 0.96 mg/dL (ref 0.61–1.24)
GFR, Estimated: >60 mL/min (ref >60)
Glucose, Bld: 87 mg/dL (ref 70–99)
Potassium: 3.8 mmol/L (ref 3.5–5.1)
Sodium: 137 mmol/L (ref 135–145)
Total Bilirubin: 0.6 mg/dL (ref 0.3–1.2)
Total Protein: 7.5 g/dL (ref 6.5–8.1)

## 2021-12-23 LAB — POCT URINE DRUG SCREEN - MANUAL ENTRY (I-SCREEN)
POC Amphetamine UR: NOT DETECTED
POC Buprenorphine (BUP): POSITIVE — AB
POC Cocaine UR: POSITIVE — AB
POC Marijuana UR: POSITIVE — AB
POC Methadone UR: NOT DETECTED
POC Methamphetamine UR: NOT DETECTED
POC Morphine: NOT DETECTED
POC Oxazepam (BZO): POSITIVE — AB
POC Oxycodone UR: NOT DETECTED
POC Secobarbital (BAR): NOT DETECTED

## 2021-12-23 LAB — RESP PANEL BY RT-PCR (FLU A&B, COVID) ARPGX2
Influenza A by PCR: NEGATIVE
Influenza B by PCR: NEGATIVE
SARS Coronavirus 2 by RT PCR: NEGATIVE

## 2021-12-23 MED ORDER — ADULT MULTIVITAMIN W/MINERALS CH
1.0000 | ORAL_TABLET | Freq: Every day | ORAL | Status: DC
  Filled 2021-12-23: qty 1

## 2021-12-23 MED ORDER — THIAMINE HCL 100 MG/ML IJ SOLN: 100.0000 mg | Freq: Once | INTRAMUSCULAR | Status: DC

## 2021-12-23 MED ORDER — CHLORDIAZEPOXIDE HCL 25 MG PO CAPS: 25.0000 mg | ORAL_CAPSULE | Freq: Four times a day (QID) | ORAL | Status: DC | PRN

## 2021-12-23 MED ORDER — ONDANSETRON 4 MG PO TBDP: 4.0000 mg | ORAL_TABLET | Freq: Four times a day (QID) | ORAL | Status: DC | PRN

## 2021-12-23 MED ORDER — HYDROXYZINE HCL 25 MG PO TABS: 25.0000 mg | ORAL_TABLET | Freq: Four times a day (QID) | ORAL | Status: DC | PRN

## 2021-12-23 MED ORDER — THIAMINE HCL 100 MG PO TABS: 100.0000 mg | ORAL_TABLET | Freq: Every day | ORAL | Status: DC

## 2021-12-23 MED ORDER — DIAZEPAM 5 MG PO TABS
5.0000 mg | ORAL_TABLET | Freq: Three times a day (TID) | ORAL | Status: DC | PRN
  Administered 2021-12-23: 5 mg via ORAL
  Filled 2021-12-23: qty 1

## 2021-12-23 MED ORDER — LOPERAMIDE HCL 2 MG PO CAPS: 2.0000 mg | ORAL_CAPSULE | ORAL | Status: DC | PRN

## 2021-12-23 NOTE — Discharge Instructions (Signed)

## 2021-12-23 NOTE — ED Notes (Signed)
Patient is resting quietly with eyes closed. Will continue to  monitor for safety ?

## 2021-12-23 NOTE — ED Notes (Signed)
Patient came to [facility] after staying in [facility]  awaiting lab results. He is cooperative ans somewhat restless. He asked for PRN Ativan. He wants to get off drugs and appears to be motivated. He was given prn Ativan. He also ate a sandwich which he appreciated it very much. Will continue to monitor for safety. ?

## 2021-12-23 NOTE — ED Provider Notes (Signed)
[facility]BC/OBS ASAP Discharge Summary ? ?Date and Time: 12/23/2021 8:42 AM  ?Name: Randall Parsons  ?MRN:  [id number]  ? ?Discharge Diagnoses:  ?Final diagnoses:  ?Severe episode of recurrent major depressive disorder, without psychotic features ([place])  ? ? ?Subjective: Patient states "I want to go to a residential detox where I can have my Valium, that is my main medication, I have been to one before." ?Randall Parsons reports he is interested in seeking substance use treatment however he is unwilling to consider [facility] because he understands this facility may not allow him to continue his reported current home medications including diazepam 5 mg 3 times daily. ?Patient verbalizes plan to seek residential substance use treatment at his leisure.  He is not able to recall specific facility that he would like to follow-up with at this time. ? ?Patient has been diagnosed with major depressive disorder, generalized anxiety disorder, cocaine use disorder, alcohol use disorder, and cocaine induced mood disorder.  He has not currently linked with outpatient psychiatry.  Medications currently managed by primary care provider, Dr. Geroge Baseman.  Patient reports he is not strictly compliant with medications related to relapse on substance. ? ?Randall Parsons is assessed face-to-face by nurse practitioner.  He is reclined in facility based crisis area upon my approach, appears asleep. [name], cooperative during assessment.  ?He presents with euthymic mood, irritable affect. He denies suicidal and homicidal ideations.  He contracts verbally for safety with this Probation officer. ? ? He has normal speech and behavior.  He denies both auditory and visual hallucinations.  Patient is able to converse coherently with goal-directed thoughts and no distractibility or preoccupation.  He denies paranoia.  Objectively there is no evidence of psychosis/mania or delusional thinking. ? ?Randall Parsons resides in [place] with his wife, denies access to weapons.  Patient  endorses average sleep and appetite.  He endorses chronic substance use including cocaine and marijuana.  He is committed to his sobriety and plans to follow-up with substance use treatment options moving forward. ? ?Patient offered support and encouragement.  He does not provide any person for this writer to contact for collateral information at this time. ? ? ?  ? ?Stay Summary: HPI from 12/22/2021 at 2218pm: Randall Parsons is a 41 y/o male with a history of MDD, and polysubstance abuse presenting voluntarily to [facility] for substance use.  Patient was brought to [facility] by his wife.  Patient reports that he is experiencing increased paranoia, hopelessness, worthlessness, feeling unstable and sleep deprived.  Patient reports that he has been using about 400's worth of crack cocaine daily.  Patient reports that he is currently on probation and he was arrested again after calling the police to report someone was following him and was subsequently charged with possession of crack cocaine and drug paraphernalia.  Patient is alert oriented x4 and does not appear to be responding to any internal or external stimuli.  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  Patient also states that he and his wife are separated due to his cocaine use and wants treatment to stop using drugs. Of note patient was just discharged from [facility] on 12/20/21. Patient denies any SI/HI/AVH.  Patient is being recommended for [facility] for safety, stabilization and crisis management. ?  ?TTS Randall Parsons; Pt is a 41 year old married male who presents to [facility] reporting depressive symptoms, severe anxiety, paranoia, auditory hallucinations, and cocaine use. He has a diagnosis of substance-induced mood  disorder and was discharged from [facility] 12/20/2021. He says he returned to using cocaine, approximately 4 grams per day. He states he has been "scared to death" and paranoid, believing that plain-clothed policemen were following  him. He states due to his paranoia he called law enforcement and was charged with felon possession of cocaine and drug paraphernalia. He says he is already on probation. He says he was released and his wife dropped him off at [facility].  ?  ?Pt describes his mood as severely anxious and depressed. Pt acknowledges symptoms including crying spells, social withdrawal, loss of interest in usual pleasures, fatigue, irritability, decreased concentration, insomnia, decreased appetite, and feelings of guilt, worthlessness and hopelessness. He says he has not slept or eaten since leaving [place]. He reports "seeing people with the same car and the same faces" following him. He says he confronted a stranger and accused him of following him. He says he hears voices berating him. He denies current suicidal ideation or history of suicide attempts. He denies homicidal ideation. He denies use of substances other than cocaine but has a history of using prescription benzodiazepines. ?  ?Pt is disheveled, alert and oriented x4. Pt speaks in a clear tone, at moderate volume and normal pace. Motor behavior appears normal. Eye contact is good and Pt is tearful at times. Pt's mood is depressed and anxious, affect is congruent with mood. Thought process is coherent and relevant. Pt says he needs to be back in treatment. ? ? ?Total Time spent with patient: 20 minutes ? ?Past Psychiatric History: major depressive disorder, generalized anxiety disorder, cocaine use disorder, alcohol use disorder, and cocaine induced mood disorder.  ?Past Medical History:  ?Past Medical History:  ?Diagnosis Date  ? CHF (congestive heart failure) ([place])   ? Exposure to hepatitis B   ? Exposure to hepatitis C   ? Hepatitis C   ? History of opioid abuse ([place])   ? reports heroin abuse, ending around 2017  ? Hypertension   ? IV drug abuse ([place])   ? Renal disorder   ? kidney stones  ?  ?Past Surgical History:  ?Procedure Laterality Date  ? APPENDECTOMY    ? EYE SURGERY    ?  secondary to dog bite  ? TIBIA FRACTURE SURGERY Right   ? ?Family History:  ?Family History  ?Problem Relation Age of Onset  ? Alcohol abuse Father   ? Melanoma Mother   ? Breast cancer Mother   ? Colon cancer Neg Hx   ? Prostate cancer Neg Hx   ? ?Family Psychiatric History: None reported ?Social History:  ?Social History  ? ?Substance and Sexual Activity  ?Alcohol Use No  ?   ?Social History  ? ?Substance and Sexual Activity  ?Drug Use Not Currently  ? Types: IV, Cocaine  ? Comment: hx of heroin abuse (opioid addiction), crack  ?  ?Social History  ? ?Socioeconomic History  ? Marital status: Married  ?  Spouse name: Not on file  ? Number of children: Not on file  ? Years of education: Not on file  ? Highest education level: Not on file  ?Occupational History  ? Not on file  ?Tobacco Use  ? Smoking status: Former  ? Smokeless tobacco: Never  ?Vaping Use  ? Vaping Use: Every day  ?Substance and Sexual Activity  ? Alcohol use: No  ? Drug use: Not Currently  ?  Types: IV, Cocaine  ?  Comment: hx of heroin abuse (opioid addiction), crack  ?  Sexual activity: Not on file  ?Other Topics Concern  ? Not on file  ?Social History Narrative  ? Working at [employer] in [place].  Married, has 5 step kids (2 at home).  From [facility].  Freight forwarder.   ? ?Social Determinants of Health  ? ?Financial Resource Strain: Not on file  ?Food Insecurity: Not on file  ?Transportation Needs: Not on file  ?Physical Activity: Not on file  ?Stress: Not on file  ?Social Connections: Not on file  ? ?SDOH:  ?SDOH Screenings  ? ?Alcohol Screen: Not on file  ?Depression (PHQ2-9): Low Risk   ? PHQ-2 Score: 3  ?Financial Resource Strain: Not on file  ?Food Insecurity: Not on file  ?Housing: Not on file  ?Physical Activity: Not on file  ?Social Connections: Not on file  ?Stress: Not on file  ?Tobacco Use: Medium Risk  ? Smoking Tobacco Use: Former  ? Smokeless Tobacco Use: Never  ? Passive Exposure: Not on file  ?Transportation Needs: Not on file   ? ? ?Tobacco Cessation:  A prescription for an FDA-approved tobacco cessation medication was offered at discharge and the patient refused ? ?Current Medications:  ?Current Facility-Administered Medications  ?Med

## 2021-12-23 NOTE — ED Notes (Signed)
Pt sleeping but easily aroused to name being called. Pt denies SI/HI/AVH. Pt interviewed by provider and RN. Pt was informed that Valium would no longer be a part of his treatment due to addictive properties. Pt request Ativan and was denied. Pt was given the option for rehab tx but pt refused. Pt states, "Can't I just stay here for a few days. Pt was informed that he didn't meet the criteria for [facility] stay and since he refused rehab tx, he would be discharged. Pt attempted to debate with provider but provider would not engage. Pt was discharged to self care. Belongings returned to pt intact and money accounted for by security, RN and pt. Pt escorted off unit by security. Safety maintained. ?

## 2021-12-23 NOTE — ED Notes (Signed)
Formatting of this note might be different from the original.  Patient is resting quietly with eyes closed. Will continue to  monitor for safety  Electronically signed by Donato Heinz, RN at 12/23/2021  3:02 AM EDT

## 2021-12-23 NOTE — ED Provider Notes (Signed)
Formatting of this note is different from the original.  FBC/OBS ASAP Discharge Summary    Date and Time: 12/23/2021 8:42 AM   Name: Lucas Hicks   MRN:  [id number]     Discharge Diagnoses:   Final diagnoses:   Severe episode of recurrent major depressive disorder, without psychotic features (HCC)     Subjective: Patient states "I want to go to a residential detox where I can have my Valium, that is my main medication, I have been to one before."  Lucas Hicks reports he is interested in seeking substance use treatment however he is unwilling to consider [facility] because he understands this facility may not allow him to continue his reported current home medications including diazepam 5 mg 3 times daily.  Patient verbalizes plan to seek residential substance use treatment at his leisure.  He is not able to recall specific facility that he would like to follow-up with at this time.    Patient has been diagnosed with major depressive disorder, generalized anxiety disorder, cocaine use disorder, alcohol use disorder, and cocaine induced mood disorder.  He has not currently linked with outpatient psychiatry.  Medications currently managed by primary care provider, Dr. Leticia Penna.  Patient reports he is not strictly compliant with medications related to relapse on substance.    Lucas Hicks is assessed face-to-face by nurse practitioner.  He is reclined in facility based crisis area upon my approach, appears asleep. [name], cooperative during assessment.   He presents with euthymic mood, irritable affect. He denies suicidal and homicidal ideations.  He contracts verbally for safety with this Clinical research associate.     He has normal speech and behavior.  He denies both auditory and visual hallucinations.  Patient is able to converse coherently with goal-directed thoughts and no distractibility or preoccupation.  He denies paranoia.  Objectively there is no evidence of psychosis/mania or delusional thinking.    Lucas Hicks resides in [place]  with his wife, denies access to weapons.  Patient endorses average sleep and appetite.  He endorses chronic substance use including cocaine and marijuana.  He is committed to his sobriety and plans to follow-up with substance use treatment options moving forward.    Patient offered support and encouragement.  He does not provide any person for this writer to contact for collateral information at this time.        Stay Summary: HPI from 12/22/2021 at 2218pm: Lucas Hicks is a 41 y/o male with a history of MDD, and polysubstance abuse presenting voluntarily to [facility] for substance use.  Patient was brought to [facility] by his wife.  Patient reports that he is experiencing increased paranoia, hopelessness, worthlessness, feeling unstable and sleep deprived.  Patient reports that he has been using about 400's worth of crack cocaine daily.  Patient reports that he is currently on probation and he was arrested again after calling the police to report someone was following him and was subsequently charged with possession of crack cocaine and drug paraphernalia.  Patient is alert oriented x4 and does not appear to be responding to any internal or external stimuli.  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  Patient also states that he and his wife are separated due to his cocaine use and wants treatment to stop using drugs. Of note patient was just discharged from [facility] on 12/20/21. Patient denies any SI/HI/AVH.  Patient is being recommended for [facility] for safety, stabilization and crisis  management.    TTS [name]; Pt is a 41 year old married male who presents to [facility] reporting depressive symptoms, severe anxiety, paranoia, auditory hallucinations, and cocaine use. He has a diagnosis of substance-induced mood disorder and was discharged from [facility] 12/20/2021. He says he returned to using cocaine, approximately 4 grams per day. He states he has been "scared to death" and paranoid,  believing that plain-clothed policemen were following him. He states due to his paranoia he called law enforcement and was charged with felon possession of cocaine and drug paraphernalia. He says he is already on probation. He says he was released and his wife dropped him off at [facility].     Pt describes his mood as severely anxious and depressed. Pt acknowledges symptoms including crying spells, social withdrawal, loss of interest in usual pleasures, fatigue, irritability, decreased concentration, insomnia, decreased appetite, and feelings of guilt, worthlessness and hopelessness. He says he has not slept or eaten since leaving [facility]. He reports "seeing people with the same car and the same faces" following him. He says he confronted a stranger and accused him of following him. He says he hears voices berating him. He denies current suicidal ideation or history of suicide attempts. He denies homicidal ideation. He denies use of substances other than cocaine but has a history of using prescription benzodiazepines.    Pt is disheveled, alert and oriented x4. Pt speaks in a clear tone, at moderate volume and normal pace. Motor behavior appears normal. Eye contact is good and Pt is tearful at times. Pt's mood is depressed and anxious, affect is congruent with mood. Thought process is coherent and relevant. Pt says he needs to be back in treatment.    Total Time spent with patient: 20 minutes    Past Psychiatric History: major depressive disorder, generalized anxiety disorder, cocaine use disorder, alcohol use disorder, and cocaine induced mood disorder.   Past Medical History:   Past Medical History:   Diagnosis Date    CHF (congestive heart failure) (HCC)     Exposure to hepatitis B     Exposure to hepatitis C     Hepatitis C     History of opioid abuse (HCC)     reports heroin abuse, ending around 2017    Hypertension     IV drug abuse (HCC)     Renal disorder     kidney stones     Past Surgical History:   Procedure  Laterality Date    APPENDECTOMY      EYE SURGERY      secondary to dog bite    TIBIA FRACTURE SURGERY Right      Family History:   Family History   Problem Relation Age of Onset    Alcohol abuse Father     Melanoma Mother     Breast cancer Mother     Colon cancer Neg Hx     Prostate cancer Neg Hx      Family Psychiatric History: None reported  Social History:   Social History     Substance and Sexual Activity   Alcohol Use No     Social History     Substance and Sexual Activity   Drug Use Not Currently    Types: IV, Cocaine    Comment: hx of heroin abuse (opioid addiction), crack     Social History     Socioeconomic History    Marital status: Married     Spouse name: Not on file  Number of children: Not on file    Years of education: Not on file    Highest education level: Not on file   Occupational History    Not on file   Tobacco Use    Smoking status: Former    Smokeless tobacco: Never   Vaping Use    Vaping Use: Every day   Substance and Sexual Activity    Alcohol use: No    Drug use: Not Currently     Types: IV, Cocaine     Comment: hx of heroin abuse (opioid addiction), crack    Sexual activity: Not on file   Other Topics Concern    Not on file   Social History Narrative    Working at [employer] in [place].  Married, has 5 step kids (2 at home).  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      Social Determinants of Health     Financial Resource Strain: Not on file   Food Insecurity: Not on file   Transportation Needs: Not on file   Physical Activity: Not on file   Stress: Not on file   Social Connections: Not on file     SDOH:   SDOH Screenings     Alcohol Screen: Not on file   Depression (PHQ2-9): Low Risk     PHQ-2 Score: 3   Financial Resource Strain: Not on file   Food Insecurity: Not on file   Housing: Not on file   Physical Activity: Not on file   Social Connections: Not on file   Stress: Not on file   Tobacco Use: Medium Risk    Smoking Tobacco Use: Former    Smokeless Tobacco Use: Never    Passive Exposure: Not  on file   Transportation Needs: Not on file     Tobacco Cessation:  A prescription for an FDA-approved tobacco cessation medication was offered at discharge and the patient refused    Current Medications:   Current Facility-Administered Medications   Medication Dose Route Frequency Provider Last Rate Last Admin    acetaminophen (TYLENOL) tablet 650 mg  650 mg Oral Q6H PRN Bobbitt, Shalon E, NP        alum & mag hydroxide-simeth (MAALOX/MYLANTA) 200-200-20 MG/5ML suspension 30 mL  30 mL Oral Q4H PRN Bobbitt, Shalon E, NP        buPROPion (WELLBUTRIN SR) 12 hr tablet 150 mg  150 mg Oral BID Bobbitt, Shalon E, NP   150 mg at 12/22/21 2340    chlordiazePOXIDE (LIBRIUM) capsule 25 mg  25 mg Oral Q6H PRN Lenard Lance, FNP        doxepin (SINEQUAN) capsule 10 mg  10 mg Oral QHS PRN Bobbitt, Shalon E, NP   10 mg at 12/22/21 2342    gabapentin (NEURONTIN) capsule 1,200 mg  1,200 mg Oral BID Bobbitt, Shalon E, NP   1,200 mg at 12/22/21 2340    hydrOXYzine (ATARAX) tablet 25 mg  25 mg Oral Q6H PRN Lenard Lance, FNP        lamoTRIgine (LAMICTAL) tablet 25 mg  25 mg Oral Daily Bobbitt, Shalon E, NP        loperamide (IMODIUM) capsule 2-4 mg  2-4 mg Oral PRN Lenard Lance, FNP        magnesium hydroxide (MILK OF MAGNESIA) suspension 30 mL  30 mL Oral Daily PRN Bobbitt, Shalon E, NP        multivitamin with minerals tablet 1 tablet  1 tablet Oral  Daily Lenard Lance, FNP        ondansetron (ZOFRAN-ODT) disintegrating tablet 4 mg  4 mg Oral Q6H PRN Lenard Lance, FNP        thiamine (B-1) injection 100 mg  100 mg Intramuscular Once Lenard Lance, FNP        Melene Muller ON 12/24/2021] thiamine tablet 100 mg  100 mg Oral Daily Lenard Lance, FNP         Current Outpatient Medications   Medication Sig Dispense Refill    albuterol (VENTOLIN HFA) 108 (90 Base) MCG/ACT inhaler Inhale 2 puffs into the lungs every 6 (six) hours as needed for wheezing or shortness of breath (shortness of breath or wheezing). INHALE 1-2 PUFFS BY MOUTH EVERY 6  HOURS AS NEEDED FOR WHEEZE OR SHORTNESS OF BREATH  Strength: 108 (90 Base) MCG/ACT 8.5 each 1    buPROPion (WELLBUTRIN SR) 150 MG 12 hr tablet Take 1 tablet (150 mg total) by mouth 2 (two) times daily. 60 tablet 0    doxepin (SINEQUAN) 10 MG capsule Take 1 capsule (10 mg total) by mouth at bedtime as needed. 30 capsule 0    gabapentin (NEURONTIN) 600 MG tablet Take 2 tablets (1,200 mg total) by mouth 2 (two) times daily. 120 tablet 0    lamoTRIgine (LAMICTAL) 25 MG tablet Take 1 tablet (25 mg total) by mouth daily. 30 tablet 0     PTA Medications: (Not in a hospital admission)    Musculoskeletal   Strength & Muscle Tone: within normal limits  Gait & Station: normal  Patient leans: N/A    Psychiatric Specialty Exam   Presentation   General Appearance: Appropriate for Environment; Casual    Eye Contact:Good    Speech:Clear and Coherent; Normal Rate    Speech Volume:Normal    Handedness:Right    Mood and Affect   Mood:Euthymic    Affect:Appropriate; Congruent    Thought Process   Thought Processes:Coherent; Goal Directed; Linear    Descriptions of Associations:Intact    Orientation:Full (Time, Place and Person)    Thought Content:Logical; WDL   Diagnosis of Schizophrenia or Schizoaffective disorder in past: No     Hallucinations:Hallucinations: None    Ideas of Reference:None    Suicidal Thoughts:Suicidal Thoughts: No    Homicidal Thoughts:Homicidal Thoughts: No    Sensorium   Memory:Immediate Good; Recent Good    Judgment:Fair    Insight:Fair    Executive Functions   Concentration:Good    Attention Span:Good    Recall:Good    Fund of Knowledge:Good    Language:Good    Psychomotor Activity   Psychomotor Activity:Psychomotor Activity: Normal    Assets   Assets:Communication Skills; Desire for Improvement; Financial Resources/Insurance; Housing; Intimacy; Leisure Time; Physical Health; Resilience; Social Support    Sleep   Sleep:Sleep: Good  Number of Hours of Sleep: -1    Nutritional Assessment (For OBS and FBC  admissions only)  Has the patient had a weight loss or gain of 10 pounds or more in the last 3 months?: No  Has the patient had a decrease in food intake/or appetite?: No  Does the patient have dental problems?: No  Does the patient have eating habits or behaviors that may be indicators of an eating disorder including binging or inducing vomiting?: No  Has the patient recently lost weight without trying?: 0  Has the patient been eating poorly because of a decreased appetite?: 0  Malnutrition Screening Tool Score: 0    Physical Exam  Physical Exam  Vitals and nursing note reviewed.   Constitutional:       Appearance: Normal appearance. He is well-developed.   HENT:      Head: Normocephalic.   Cardiovascular:      Rate and Rhythm: Normal rate.   Pulmonary:      Effort: Pulmonary effort is normal.   Musculoskeletal:         General: Normal range of motion.   Skin:     General: Skin is warm and dry.   Neurological:      Mental Status: [name] to person, place, and time.   Psychiatric:         Attention and Perception: Attention and perception normal.         Mood and Affect: Mood and affect normal.         Speech: Speech normal.         Behavior: Behavior normal. Behavior is cooperative.         Thought Content: Thought content normal.         Cognition and Memory: Cognition and memory normal.         Judgment: Judgment normal.     Review of Systems   Constitutional: Negative.    HENT: Negative.     Eyes: Negative.    Respiratory: Negative.     Cardiovascular: Negative.    Gastrointestinal: Negative.    Genitourinary: Negative.    Musculoskeletal: Negative.    Skin: Negative.    Neurological: Negative.    Endo/Heme/Allergies: Negative.    Psychiatric/Behavioral:  Positive for substance abuse.    Blood pressure 138/89, pulse 67, temperature 98.5 F (36.9 C), temperature source Tympanic, resp. rate 18, SpO2 97 %. There is no height or weight on file to calculate BMI.    Demographic Factors:   Male and  Caucasian    Loss Factors:  NA    Historical Factors:  NA    Risk Reduction Factors:    Sense of responsibility to family, Living with another person, especially a relative, Positive social support, Positive therapeutic relationship, and Positive coping skills or problem solving skills    Continued Clinical Symptoms:   Alcohol/Substance Abuse/Dependencies  Previous Psychiatric Diagnoses and Treatments    Cognitive Features That Contribute To Risk:   None      Suicide Risk:   Minimal: No identifiable suicidal ideation.  Patients presenting with no risk factors but with morbid ruminations; may be classified as minimal risk based on the severity of the depressive symptoms    Plan Of Care/Follow-up recommendations:   Patient reviewed with Dr Bronwen Betters.   Follow-up with outpatient psychiatry, resources provided.  Continue current medications including:  -Albuterol 108 (90 base) inhaler 2 puffs every 6 hours as needed/wheeze or shortness of breath  -Bupropion 150 mg twice daily  -Doxepin 10 mg nightly as needed/sleep  -Gabapentin 1200 mg twice daily  -Lamotrigine 25 mg daily  Follow-up with substance use treatment resources provided.    Disposition: Discharge    Lenard Lance, FNP  12/23/2021, 8:42 AM      Electronically signed by Estella Husk, MD at 12/23/2021 12:10 PM EDT    Associated attestation - Estella Husk, MD - 12/23/2021 12:10 PM EDT  Formatting of this note might be different from the original.  I have reviewed the note by the NP, and discussed the plan of care.  I am in agreement with the assessment and plan.  Patient is familiar to me- He was recently admitted to the [facility] reporting that he wanted assistance with substance use treatment; however, he was not willing to taper and discontinue valium as part of detox in order to get into a residential rehab. At that time patient was educated extensively that no rehab facility will allow him to continue controlled substances such as valium and he  verbalized understanding. During that admission, he was medication seeking in an attempt to get valium as he had reported it had recently been stolen. Per PMP, patient had recently received #120 5 mg valium.Today, patient denies SI/HI/AVH. He continues to decline to taper and discontinue valium so that he may enter substance treatment. Patient does not meet criteria for [facility] and currently patient would gain no benefit from remaining in the [facility]. Patient is appropriate for discharge

## 2021-12-23 NOTE — ED Notes (Signed)
Formatting of this note might be different from the original.  Pt sleeping but easily aroused to name being called. Pt denies SI/HI/AVH. Pt interviewed by provider and RN. Pt was informed that Valium would no longer be a part of his treatment due to addictive properties. Pt request Ativan and was denied. Pt was given the option for rehab tx but pt refused. Pt states, "Can't I just stay here for a few days. Pt was informed that he didn't meet the criteria for [facility] stay and since he refused rehab tx, he would be discharged. Pt attempted to debate with provider but provider would not engage. Pt was discharged to self care. Belongings returned to pt intact and money accounted for by security, RN and pt. Pt escorted off unit by security. Safety maintained.  Electronically signed by Prentice Docker, RN at 12/23/2021  9:56 AM EDT

## 2021-12-23 NOTE — ED Notes (Signed)
Formatting of this note might be different from the original.  Patient came to [facility] after staying in [facility]  awaiting lab results. He is cooperative ans somewhat restless. He asked for PRN Ativan. He wants to get off drugs and appears to be motivated. He was given prn Ativan. He also ate a sandwich which he appreciated it very much. Will continue to monitor for safety.  Electronically signed by Donato Heinz, RN at 12/23/2021  2:59 AM EDT

## 2021-12-25 ENCOUNTER — Ambulatory Visit (HOSPITAL_COMMUNITY)
Admission: EM | Admit: 2021-12-25 | Discharge: 2021-12-26 | Disposition: A | Discharge status: Home / Self Care | Payer: 59

## 2021-12-25 DIAGNOSIS — F1420 Cocaine dependence, uncomplicated: Secondary | ICD-10-CM

## 2021-12-25 DIAGNOSIS — F1994 Other psychoactive substance use, unspecified with psychoactive substance-induced mood disorder: Secondary | ICD-10-CM

## 2021-12-25 DIAGNOSIS — R69 Illness, unspecified: Secondary | ICD-10-CM | POA: Diagnosis not present

## 2021-12-25 NOTE — ED Provider Notes (Signed)
Behavioral Health Urgent Care Medical Screening Exam ? ?Patient Name: Randall Parsons ?MRN: [id number] ?Date of Evaluation: 12/26/21 ?Chief Complaint:   ?Diagnosis:  ?Final diagnoses:  ?Substance induced mood disorder ([place])  ?Cocaine use disorder, severe, dependence ([place])  ? ? ?History of Present illness: Randall Parsons is a 41 y.o. male who presents to [facility] voluntarily with his spouse due to cocaine abuse, AVH, and paranoia. Patient's wife participates in the assessment with the patient's permission. Patient reports that he has continued to smoke crack/cocaine several times daily. He reports auditory hallucinations: "peoples voices that I know saying things that only that person would know." Reports VH:"things I see are real, but I might interpret them differently than others." Patient reports feeling paranoid that law enforcement is after him. Patient does appear to be paranoid and repeatedly looks out the window of the exam room door. He reports that he was charged on Saturday with felony cocaine possession. Patient denies use of alcohol, marijuana, methamphetamines, and other substances. He reports a history of polysubstance abuse, to include alcohol, cocaine, and opioids. He receives sublocade injection monthly.  ? ?Patient has been diagnosed with major depressive disorder, generalized anxiety disorder, cocaine use disorder, alcohol use disorder, and cocaine induced mood disorder.  He has not currently linked with outpatient psychiatry.  Medications currently managed by primary care provider, Dr. Geroge Baseman.  Patient reports he is not strictly compliant with medications related to relapse on substance. ? ?Patient was admitted to [facility] for substance abuse treatment on 12/17/2021 and was discharged per his request on 12/20/2021. Per notes, the patient was requesting assistance with finding a facility that would allow him to continue taking valium. Patient was admitted to [facility] on 12/22/2021 and was discharged on the same  date. ? ?On evaluation patient is alert and oriented x 4. He is cooperative. Speech is clear and coherent. Mood is depressed and affect is congruent with mood. Thought process is coherent. Reports AVH as indicated above. No indication that patient is responding to internal stimuli. Reports feeling paranoid that law enforcement is after him. Patient does appear to be paranoid and repeatedly looks out the window of the exam room door. Denies suicidal ideations. Denies homicidal ideations.  ?Psychiatric Specialty Exam ? ?Presentation  ?General Appearance:Appropriate for Environment; Fairly Groomed ? ?Eye Contact:Fair ? ?Speech:Clear and Coherent; Normal Rate ? ?Speech Volume:Normal ? ?Handedness:Right ? ? ?Mood and Affect  ?Mood:Hopeless; Worthless ? ?Affect:Congruent ? ? ?Thought Process  ?Thought Processes:Coherent; Goal Directed; Linear ? ?Descriptions of Associations:Intact ? ?Orientation:Full (Time, Place and Person) ? ?Thought Content:Paranoid Ideation ? Diagnosis of Schizophrenia or Schizoaffective disorder in past: No ? Duration of Psychotic Symptoms: Less than six months ? Hallucinations:Auditory; Visual ?hears the voices of people he knows ?"I see things that may be real but i interpret them differently" ? ?Ideas of Reference:Paranoia ? ?Suicidal Thoughts:No ? ?Homicidal Thoughts:No ? ? ?Sensorium  ?Memory:Immediate Good; Recent Good ? ?Judgment:Fair ? ?Insight:Fair ? ? ?Executive Functions  ?Concentration:Fair ? ?Attention Span:Fair ? ?Recall:Good ? ?Fund of [place] ? ?Language:Good ? ? ?Psychomotor Activity  ?Psychomotor Activity:Normal ? ? ?Assets  ?Assets:Communication Skills; Desire for Improvement; Financial Resources/Insurance; Housing; Physical Health ? ? ?Sleep  ?Sleep:Poor ? ?Number of hours: -1 ? ? ?Nutritional Assessment (For OBS and FBC admissions only) ?Has the patient had a weight loss or gain of 10 pounds or more in the last 3 months?: Yes ?Has the patient had a decrease in food  intake/or appetite?: Yes ?Does the patient have dental problems?: No ?Does the  patient have eating habits or behaviors that may be indicators of an eating disorder including binging or inducing vomiting?: No ?Has the patient recently lost weight without trying?: 2 ?Has the patient been eating poorly because of a decreased appetite?: 1 ?Malnutrition Screening Tool Score: 3 ?Nutritional Assessment Referrals: Refer to Primary Care Provider ? ? ? ?Physical Exam: ?Physical Exam ?Constitutional:   ?   General: He is not in acute distress. ?   Appearance: He is not ill-appearing, toxic-appearing or diaphoretic.  ?HENT:  ?   Head: Normocephalic.  ?   Right Ear: External ear normal.  ?   Left Ear: External ear normal.  ?Eyes:  ?   Pupils: Pupils are equal, round, and reactive to light.  ?Cardiovascular:  ?   Rate and Rhythm: Normal rate.  ?Pulmonary:  ?   Effort: Pulmonary effort is normal. No respiratory distress.  ?Musculoskeletal:     ?   General: Normal range of motion.  ?Skin: ?   General: Skin is warm and dry.  ?Neurological:  ?   Mental Status: [name] to person, place, and time.  ?Psychiatric:     ?   Mood and Affect: Mood is anxious and depressed.     ?   Speech: Speech normal.     ?   Behavior: Behavior is cooperative.     ?   Thought Content: Thought content is paranoid. Thought content is not delusional. Thought content does not include homicidal or suicidal ideation. Thought content does not include suicidal plan.  ? ?Review of Systems  ?Constitutional:  Negative for chills, diaphoresis, fever, malaise/fatigue and weight loss.  ?HENT:  Negative for congestion.   ?Respiratory:  Negative for cough and shortness of breath.   ?Cardiovascular:  Negative for chest pain and palpitations.  ?Gastrointestinal:  Negative for diarrhea, nausea and vomiting.  ?Neurological:  Negative for dizziness and seizures.  ?Psychiatric/Behavioral:  Positive for depression, hallucinations and substance abuse. Negative  for memory loss and suicidal ideas. The patient is nervous/anxious and has insomnia.   ?All other systems reviewed and are negative. ? ?Blood pressure (!) 155/92, pulse 91, temperature 98.9 ?F (37.2 ?C), temperature source Oral, resp. rate 20, SpO2 98 %. There is no height or weight on file to calculate BMI. ? ?Musculoskeletal: ?Strength & Muscle Tone: within normal limits ?Gait & Station: normal ?Patient leans: N/A ? ? ?[facility] MSE Discharge Disposition for Follow up and Recommendations: ?Based on my evaluation the patient does not appear to have an emergency medical condition and can be discharged with resources and follow up care in outpatient services for Medication Management and Individual Therapy ? ?Patient was offered admission to continuous assessment. Patient declined. Patient is not an imminent risk to himself or others and does not meet IVC criteria.  ? ? ?Rozetta Nunnery, NP ?12/26/2021, 1:51 AM ? ?

## 2021-12-25 NOTE — Discharge Instructions (Addendum)
?  Discharge recommendations:  Patient is to take medications as prescribed. Please see information for follow-up appointment with psychiatry and therapy. Please follow up with your primary care provider for all medical related needs.   Therapy: We recommend that patient participate in individual therapy to address mental health concerns.  Medications: The patient is to contact a medical professional and/or outpatient provider to address any new side effects that develop. Parent/guardian should update outpatient providers of any new medications and/or medication changes.   Atypical antipsychotics: If you are prescribed an atypical antipsychotic, it is recommended that your height, weight, BMI, blood pressure, fasting lipid panel, and fasting blood sugar be monitored by your outpatient providers.  Safety:  The patient should abstain from use of illicit substances/drugs and abuse of any medications. If symptoms worsen or do not continue to improve or if the patient becomes actively suicidal or homicidal then it is recommended that the patient return to the closest hospital emergency department, the [facility], or call 911 for further evaluation and treatment. National Suicide Prevention Lifeline 1-800-SUICIDE or 1-800-273-8255.  About 988 988 offers 24/7 access to trained crisis counselors who can help people experiencing mental health-related distress. People can call or text 988 or chat 988lifeline.org for themselves or if they are worried about a loved one who may need crisis support.  

## 2021-12-25 NOTE — Progress Notes (Signed)
Formatting of this note is different from the original.  TRIAGE: ROUTINE    Nira Conn, NP, reviewed pt's chart and information and met with pt and his wife and determined pt could benefit from detox and treatment services. After some discussion, pt and his wife opted to go to [facility] for detox and treatment services.      12/25/21 2255   [facility] Triage Screening (Walk-ins at [facility] only)   How Did You Hear About [place]? Family/Friend   What Is the Reason for Your Visit/Call Today? Pt states he is here, "for an evaluation for mental health." Pt shares he has been experiencing "high anxiety and not sleeping." Pt shares his appetite has been "poor" and his wife shares he has lost approx 35 lbs in the last 4 weeks. Pt acknowledges he relapsed on cocaine 4 weeks ago and that, since that time, he has been out of the home, staying in a motel, etc. Pt shares he used 2 grams of cocaine today and last used just prior to him coming to the [facility]. Pt denies SI or a plan to kill himself. He denies HI and VH. Pt endorses AH of people he knows and paranoia that people are after him, that he's going to have to spend a long time in jail/prison, etc. Pt has recently been charged with felony cocaine possession and paraphernalia; he is currently on probation.   How Long Has This Been Causing You Problems? 1-6 months   Have You Recently Had Any Thoughts About Hurting Yourself? No   Are You Planning to Commit Suicide/Harm Yourself At This time? No   Have you Recently Had Thoughts About Hurting Someone [name]? No   Are You Planning To Harm Someone At This Time? No   Are you currently experiencing any auditory, visual or other hallucinations? Yes   Please explain the hallucinations you are currently experiencing: Pt shares he has been hearing voices of people he knows.   Have You Used Any Alcohol or Drugs in the Past 24 Hours? Yes   How long ago did you use Drugs or Alcohol? Pt states he used 2 grams of cocaine today and last used just prior to coming to  the [facility].   What Did You Use and How Much? 2 grams of cocaine   Do you have any current medical co-morbidities that require immediate attention? No   Clinician description of patient physical appearance/behavior: Pt is anxious due to paranoia. He is tearful and requires coaxing from his wife. He has bruises on his arms and is constantly shaking his leg.   What Do You Feel Would Help You the Most Today? Alcohol or Drug Use Treatment;Medication(s)   If access to [facility] was not available, would you have sought care in the Emergency Department? Yes   Determination of Need Routine (7 days)   Options For Referral Medication Management;Other: Comment  (Detox)       Electronically signed by Ralph Dowdy, LMFT at 12/26/2021 12:10 AM EDT

## 2021-12-25 NOTE — ED Provider Notes (Signed)
Formatting of this note is different from the original.  Behavioral Health Urgent Care Medical Screening Exam    Patient Name: Lucas Hicks  MRN: [id number]  Date of Evaluation: 12/26/21  Chief Complaint:    Diagnosis:   Final diagnoses:   Substance induced mood disorder (H[facility])   Cocaine use disorder, severe, dependence (H[facility])     History of Present illness: Lucas Hicks is a 41 y.o. male who presents to [facility] voluntarily with his spouse due to cocaine abuse, AVH, and paranoia. Patient's wife participates in the assessment with the patient's permission. Patient reports that he has continued to smoke crack/cocaine several times daily. He reports auditory hallucinations: "peoples voices that I know saying things that only that person would know." Reports VH:"things I see are real, but I might interpret them differently than others." Patient reports feeling paranoid that law enforcement is after him. Patient does appear to be paranoid and repeatedly looks out the window of the exam room door. He reports that he was charged on Saturday with felony cocaine possession. Patient denies use of alcohol, marijuana, methamphetamines, and other substances. He reports a history of polysubstance abuse, to include alcohol, cocaine, and opioids. He receives sublocade injection monthly.     Patient has been diagnosed with major depressive disorder, generalized anxiety disorder, cocaine use disorder, alcohol use disorder, and cocaine induced mood disorder.  He has not currently linked with outpatient psychiatry.  Medications currently managed by primary care provider, Dr. Leticia Penna.  Patient reports he is not strictly compliant with medications related to relapse on substance.    Patient was admitted to [facility] for substance abuse treatment on 12/17/2021 and was discharged per his request on 12/20/2021. Per notes, the patient was requesting assistance with finding a facility that would allow him to continue taking valium. Patient was admitted to  [facility] on 12/22/2021 and was discharged on the same date.    On evaluation patient is alert and oriented x 4. He is cooperative. Speech is clear and coherent. Mood is depressed and affect is congruent with mood. Thought process is coherent. Reports AVH as indicated above. No indication that patient is responding to internal stimuli. Reports feeling paranoid that law enforcement is after him. Patient does appear to be paranoid and repeatedly looks out the window of the exam room door. Denies suicidal ideations. Denies homicidal ideations.   Psychiatric Specialty Exam    Presentation   General Appearance:Appropriate for Environment; Fairly Groomed    Eye Contact:Fair    Speech:Clear and Coherent; Normal Rate    Speech Volume:Normal    Handedness:Right    Mood and Affect   Mood:Hopeless; Worthless    Affect:Congruent    Acupuncturist; Goal Directed; Linear    Descriptions of Associations:Intact    Orientation:Full (Time, Place and Person)    Thought Content:Paranoid Ideation   Diagnosis of Schizophrenia or Schizoaffective disorder in past: No   Duration of Psychotic Symptoms: Less than six months   Hallucinations:Auditory; Visual  hears the voices of people he knows  "I see things that may be real but i interpret them differently"    Ideas of Reference:Paranoia    Suicidal Thoughts:No    Homicidal Thoughts:No    Sensorium   Memory:Immediate Good; Recent Good    Judgment:Fair    Insight:Fair    Executive Functions   Concentration:Fair    Attention Span:Fair    Recall:Good    Fund of Knowledge:Good    Language:Good    Psychomotor Activity  Psychomotor Activity:Normal    Assets   Assets:Communication Skills; Desire for Improvement; Financial Resources/Insurance; Housing; Physical Health    Sleep   Sleep:Poor    Number of hours: -1    Nutritional Assessment (For OBS and FBC admissions only)  Has the patient had a weight loss or gain of 10 pounds or more in the last 3 months?: Yes  Has the  patient had a decrease in food intake/or appetite?: Yes  Does the patient have dental problems?: No  Does the patient have eating habits or behaviors that may be indicators of an eating disorder including binging or inducing vomiting?: No  Has the patient recently lost weight without trying?: 2  Has the patient been eating poorly because of a decreased appetite?: 1  Malnutrition Screening Tool Score: 3  Nutritional Assessment Referrals: Refer to Primary Care Provider    Physical Exam:  Physical Exam  Constitutional:       General: He is not in acute distress.     Appearance: He is not ill-appearing, toxic-appearing or diaphoretic.   HENT:      Head: Normocephalic.      Right Ear: External ear normal.      Left Ear: External ear normal.   Eyes:      Pupils: Pupils are equal, round, and reactive to light.   Cardiovascular:      Rate and Rhythm: Normal rate.   Pulmonary:      Effort: Pulmonary effort is normal. No respiratory distress.   Musculoskeletal:         General: Normal range of motion.   Skin:     General: Skin is warm and dry.   Neurological:      Mental Status: [name] to person, place, and time.   Psychiatric:         Mood and Affect: Mood is anxious and depressed.         Speech: Speech normal.         Behavior: Behavior is cooperative.         Thought Content: Thought content is paranoid. Thought content is not delusional. Thought content does not include homicidal or suicidal ideation. Thought content does not include suicidal plan.     Review of Systems   Constitutional:  Negative for chills, diaphoresis, fever, malaise/fatigue and weight loss.   HENT:  Negative for congestion.    Respiratory:  Negative for cough and shortness of breath.    Cardiovascular:  Negative for chest pain and palpitations.   Gastrointestinal:  Negative for diarrhea, nausea and vomiting.   Neurological:  Negative for dizziness and seizures.   Psychiatric/Behavioral:  Positive for depression, hallucinations and  substance abuse. Negative for memory loss and suicidal ideas. The patient is nervous/anxious and has insomnia.    All other systems reviewed and are negative.    Blood pressure (!) 155/92, pulse 91, temperature 98.9 F (37.2 C), temperature source Oral, resp. rate 20, SpO2 98 %. There is no height or weight on file to calculate BMI.    Musculoskeletal:  Strength & Muscle Tone: within normal limits  Gait & Station: normal  Patient leans: N/A    [facility] MSE Discharge Disposition for Follow up and Recommendations:  Based on my evaluation the patient does not appear to have an emergency medical condition and can be discharged with resources and follow up care in outpatient services for Medication Management and Individual Therapy    Patient was offered admission to continuous assessment.  Patient declined. Patient is not an imminent risk to himself or others and does not meet IVC criteria.     Jackelyn Poling, NP  12/26/2021, 1:51 AM    Electronically signed by Nelly Rout, MD at 12/26/2021  8:41 AM EDT    Associated attestation - Nelly Rout, MD - 12/26/2021  8:41 AM EDT  Formatting of this note might be different from the original.  Agree with above assessment, plan and medical screening exam

## 2021-12-26 DIAGNOSIS — G4700 Insomnia, unspecified: Secondary | ICD-10-CM | POA: Diagnosis not present

## 2021-12-26 DIAGNOSIS — F1490 Cocaine use, unspecified, uncomplicated: Secondary | ICD-10-CM | POA: Diagnosis not present

## 2021-12-26 DIAGNOSIS — Z20822 Contact with and (suspected) exposure to covid-19: Secondary | ICD-10-CM | POA: Diagnosis not present

## 2021-12-26 DIAGNOSIS — R69 Illness, unspecified: Secondary | ICD-10-CM | POA: Diagnosis not present

## 2021-12-26 NOTE — Progress Notes (Signed)
TRIAGE: ROUTINE ? ?Lindon Romp, NP, reviewed pt's chart and information and met with pt and his wife and determined pt could benefit from detox and treatment services. After some discussion, pt and his wife opted to go to [facility] for detox and treatment services.  ? ? 12/25/21 2255  ?[place] Triage Screening (Walk-ins at [facility] only)  ?How Did You Hear About [place]? Family/Friend  ?What Is the Reason for Your Visit/Call Today? Pt states he is here, "for an evaluation for mental health." Pt shares he has been experiencing "high anxiety and not sleeping." Pt shares his appetite has been "poor" and his wife shares he has lost approx 35 lbs in the last 4 weeks. Pt acknowledges he relapsed on cocaine 4 weeks ago and that, since that time, he has been out of the home, staying in a motel, etc. Pt shares he used 2 grams of cocaine today and last used just prior to him coming to the [facility]. Pt denies SI or a plan to kill himself. He denies HI and VH. Pt endorses AH of people he knows and paranoia that people are after him, that he's going to have to spend a long time in jail/prison, etc. Pt has recently been charged with felony cocaine possession and paraphernalia; he is currently on probation.  ?How Long Has This Been Causing You Problems? 1-6 months  ?Have You Recently Had Any Thoughts About Hurting Yourself? No  ?Are You Planning to Commit Suicide/Harm Yourself At This time? No  ?Have you Recently Had Thoughts About [place]? No  ?Are You Planning To Harm Someone At This Time? No  ?Are you currently experiencing any auditory, visual or other hallucinations? Yes  ?Please explain the hallucinations you are currently experiencing: Pt shares he has been hearing voices of people he knows.  ?Have You Used Any Alcohol or Drugs in the Past 24 Hours? Yes  ?How long ago did you use Drugs or Alcohol? Pt states he used 2 grams of cocaine today and last used just prior to coming to the [facility].  ?What Did You Use and How Much? 2 grams of  cocaine  ?Do you have any current medical co-morbidities that require immediate attention? No  ?Clinician description of patient physical appearance/behavior: Pt is anxious due to paranoia. He is tearful and requires coaxing from his wife. He has bruises on his arms and is constantly shaking his leg.  ?What Do You Feel Would Help You the Most Today? Alcohol or Drug Use Treatment;Medication(s)  ?If access to [facility] was not available, would you have sought care in the Emergency Department? Yes  ?Determination of Need Routine (7 days)  ?Options For Referral Medication Management;Other: Comment ?(Detox)  ? ? ?

## 2021-12-26 NOTE — BH Assessment (Signed)
Paraphernalia  ? ? ?

## 2021-12-26 NOTE — Unmapped (Signed)
Formatting of this note might be different from the original.  Paraphernalia       Electronically signed by Ralph Dowdy, LMFT at 12/26/2021 12:10 AM EDT

## 2022-01-07 DIAGNOSIS — R5382 Chronic fatigue, unspecified: Secondary | ICD-10-CM | POA: Diagnosis not present

## 2022-04-01 DIAGNOSIS — D225 Melanocytic nevi of trunk: Secondary | ICD-10-CM | POA: Diagnosis not present

## 2022-04-01 DIAGNOSIS — D485 Neoplasm of uncertain behavior of skin: Secondary | ICD-10-CM | POA: Diagnosis not present

## 2022-04-01 DIAGNOSIS — D2272 Melanocytic nevi of left lower limb, including hip: Secondary | ICD-10-CM | POA: Diagnosis not present

## 2022-04-27 ENCOUNTER — Encounter (HOSPITAL_COMMUNITY): Payer: Self-pay | Admitting: Emergency Medicine

## 2022-04-27 ENCOUNTER — Emergency Department (HOSPITAL_COMMUNITY)
Admission: EM | Admit: 2022-04-27 | Discharge: 2022-04-27 | Disposition: A | Discharge status: Home / Self Care | Payer: 59 | Attending: Emergency Medicine | Admitting: Emergency Medicine

## 2022-04-27 ENCOUNTER — Other Ambulatory Visit: Payer: Self-pay

## 2022-04-27 DIAGNOSIS — J029 Acute pharyngitis, unspecified: Secondary | ICD-10-CM | POA: Insufficient documentation

## 2022-04-27 DIAGNOSIS — B9789 Other viral agents as the cause of diseases classified elsewhere: Secondary | ICD-10-CM | POA: Diagnosis not present

## 2022-04-27 LAB — GROUP A STREP BY PCR: Group A Strep by PCR: NOT DETECTED

## 2022-04-27 MED ORDER — LIDOCAINE VISCOUS HCL 2 % MT SOLN: 15.0000 mL | OROMUCOSAL | 0 refills | Status: DC | PRN

## 2022-04-27 MED ORDER — LIDOCAINE VISCOUS HCL 2 % MT SOLN
15.0000 mL | Freq: Once | OROMUCOSAL | Status: AC
  Administered 2022-04-27: 15 mL via OROMUCOSAL
  Filled 2022-04-27: qty 15

## 2022-04-27 MED ORDER — ACETAMINOPHEN 500 MG PO TABS
500.0000 mg | ORAL_TABLET | Freq: Four times a day (QID) | ORAL | 0 refills | Status: DC | PRN
Start: 2022-04-27 — End: 2023-01-18

## 2022-04-27 NOTE — ED Provider Notes (Signed)
[facility]-EMERGENCY DEPT Provider Note   CSN: [id number] Arrival date & time: 04/27/22  2013     History  Chief Complaint  Patient presents with   Sore Throat         Randall Parsons is a 41 y.o. male.  The history is provided by the patient and medical records. No language interpreter was used.  Sore Throat    41 year old male significant history of polysubstance abuse, hepatitis C, IV drug use, depression, alcohol use presenting complaint sore throat.  Patient report for the past 2 to 3 days he has had persistent worsening sore throat.  States pain with swallowing and noticed his voice is raspy because of the pain.  Pain felt similar to prior tonsillitis that he has had in the past.  He also reported fever of 101 last night.  She tried taking Tylenol with minimal relief.  He denies headache, congestion, productive cough, chest pain or shortness of breath.  He denies any recent sick contact.  No recent trauma.  Home Medications Prior to Admission medications   Medication Sig Start Date End Date Taking? Authorizing Provider  albuterol (VENTOLIN HFA) 108 (90 Base) MCG/ACT inhaler Inhale 2 puffs into the lungs every 6 (six) hours as needed for wheezing or shortness of breath (shortness of breath or wheezing). INHALE 1-2 PUFFS BY MOUTH EVERY 6 HOURS AS NEEDED FOR WHEEZE OR SHORTNESS OF BREATH Strength: 108 (90 Base) MCG/ACT 12/20/21   Estella Husk, MD  buPROPion [facility] SR) 150 MG 12 hr tablet Take 1 tablet (150 mg total) by mouth 2 (two) times daily. 12/20/21 01/19/22  Estella Husk, MD  doxepin (SINEQUAN) 10 MG capsule Take 1 capsule (10 mg total) by mouth at bedtime as needed. 12/20/21 01/19/22  Estella Husk, MD  gabapentin (NEURONTIN) 600 MG tablet Take 2 tablets (1,200 mg total) by mouth 2 (two) times daily. 12/20/21   Estella Husk, MD  lamoTRIgine (LAMICTAL) 25 MG tablet Take 1 tablet (25 mg total) by mouth daily. 12/21/21 01/20/22   Estella Husk, MD  pantoprazole (PROTONIX) 40 MG tablet Take 1 tablet (40 mg total) by mouth daily. 07/17/19 08/03/19  Clapacs, Jackquline Denmark, MD      Allergies    Amoxicillin and Prednisone    Review of Systems   Review of Systems  All other systems reviewed and are negative.   Physical Exam Updated Vital Signs BP 128/83 (BP Location: Left Arm)   Pulse 90   Temp 98.9 F (37.2 C) (Oral)   Resp 19   SpO2 100%  Physical Exam Vitals and nursing note reviewed.  Constitutional:      General: He is not in acute distress.    Appearance: He is well-developed.  HENT:     Head: Atraumatic.     Right Ear: Tympanic membrane normal.     Left Ear: Tympanic membrane normal.     Mouth/Throat:     Mouth: Mucous membranes are moist. No oral lesions.     Pharynx: Oropharynx is clear. Uvula midline. No pharyngeal swelling, oropharyngeal exudate, posterior oropharyngeal erythema or uvula swelling.     Tonsils: No tonsillar exudate or tonsillar abscesses.  Eyes:     Conjunctiva/sclera: Conjunctivae normal.  Cardiovascular:     Rate and Rhythm: Normal rate and regular rhythm.     Heart sounds: Normal heart sounds.  Pulmonary:     Effort: Pulmonary effort is normal.     Breath sounds: Normal breath sounds. No wheezing.  Abdominal:     Tenderness: There is no abdominal tenderness. There is no guarding.  Musculoskeletal:     Cervical back: Neck supple.  Skin:    Findings: No rash.  Neurological:     Mental Status: He is alert.     ED Results / Procedures / Treatments   Labs (all labs ordered are listed, but only abnormal results are displayed) Labs Reviewed  GROUP A STREP BY PCR    EKG None  Radiology No results found.  Procedures Procedures    Medications Ordered in ED Medications - No data to display  ED Course/ Medical Decision Making/ A&P                           Medical Decision Making Risk Prescription drug management.   BP 128/83 (BP Location: Left Arm)    Pulse 90   Temp 98.9 F (37.2 C) (Oral)   Resp 19   SpO2 100%   Patient presenting with sore throat consistent with viral pharyngitis.  2 out of 4 Centor criteria.  The patient did not have trismus, hot potato voice, uvula deviation, unilateral tonsillar swelling, toxic appearance, drooling or pain with movement of the trachea to suggest peritonsillar abscess or epiglottitis.  No evidence of bacterial infections including peritonsillar abscess, retropharyngeal abscess, epiglottitis.  Rapid strep was obtained and was negative decreasing the likelihood of bacterial pharyngitis (A rapid strep test has a 95% sensitivity and a specificity of 98%). While in ED patient was provided with viscous lidocaine with improvement of sxs. Vital signs responded with no treatment. Patient advised to continue ibuprofen and Tylenol at home.  Impression: Viral Pharyngitis Sore Throat   Plan:    * Discharge from ED   * Advised Pt on supportive therapies, including using a cool-mist vaporizer/humidifer/steam from hot showers, limit talking, OTC throat lozenges and mouthwashes qd, gargling w/ warm saltwater, advancement of fluids as tolerated, nasal saline sprays, rest, OTC acetaminophen or ibuprofen as directed prn for pain control, frequent handwashing, and boiling/disposing of contaminated toothbrushes.   * Instructed Pt to f/up w/ PCP or ENT should Sx worsen or not improve. Pt verbally expressed understanding and all questions were addressed to Pt's satisfaction.   * Informed to return to ER if has new or worsening symptoms.         Final Clinical Impression(s) / ED Diagnoses Final diagnoses:  Viral pharyngitis    Rx / DC Orders ED Discharge Orders          Ordered    lidocaine (XYLOCAINE) 2 % solution  As needed        04/27/22 2152    acetaminophen (TYLENOL) 500 MG tablet  Every 6 hours PRN        04/27/22 2152              Fayrene Helper, PA-C 04/27/22 2159    Glynn Octave,  MD 04/27/22 2322

## 2022-04-27 NOTE — Discharge Instructions (Signed)
You have symptoms likely due to a viral pharyngitis.  Your strep test came back negative.  You may use viscous lidocaine rinse and swallow as needed for comfort.  Take Tylenol for your fever.  You may follow-up with your primary care doctor for further care.

## 2022-04-27 NOTE — ED Triage Notes (Signed)
Pt reports sore throat for the last 2-3 days. States that it feels swollen and similar to when he had tonsillitis. Reports a fever of 101 last night. Denies coughing. No exudate seen.

## 2023-01-01 ENCOUNTER — Encounter: Payer: Self-pay | Admitting: Urology

## 2023-01-10 ENCOUNTER — Inpatient Hospital Stay (HOSPITAL_COMMUNITY)
Admission: EM | Admit: 2023-01-10 | Discharge: 2023-01-18 | DRG: 917 | Disposition: A | Discharge status: Home / Self Care | Payer: Medicaid Other | Attending: Internal Medicine | Admitting: Internal Medicine

## 2023-01-10 ENCOUNTER — Emergency Department (HOSPITAL_COMMUNITY): Payer: Medicaid Other

## 2023-01-10 ENCOUNTER — Other Ambulatory Visit: Payer: Self-pay

## 2023-01-10 ENCOUNTER — Inpatient Hospital Stay (HOSPITAL_COMMUNITY): Payer: Medicaid Other

## 2023-01-10 DIAGNOSIS — J9601 Acute respiratory failure with hypoxia: Secondary | ICD-10-CM

## 2023-01-10 DIAGNOSIS — I21A1 Myocardial infarction type 2: Secondary | ICD-10-CM | POA: Diagnosis present

## 2023-01-10 DIAGNOSIS — N179 Acute kidney failure, unspecified: Secondary | ICD-10-CM | POA: Diagnosis present

## 2023-01-10 DIAGNOSIS — B1920 Unspecified viral hepatitis C without hepatic coma: Secondary | ICD-10-CM | POA: Diagnosis not present

## 2023-01-10 DIAGNOSIS — Z880 Allergy status to penicillin: Secondary | ICD-10-CM

## 2023-01-10 DIAGNOSIS — Z79899 Other long term (current) drug therapy: Secondary | ICD-10-CM

## 2023-01-10 DIAGNOSIS — Z888 Allergy status to other drugs, medicaments and biological substances status: Secondary | ICD-10-CM

## 2023-01-10 DIAGNOSIS — M6282 Rhabdomyolysis: Secondary | ICD-10-CM | POA: Diagnosis present

## 2023-01-10 DIAGNOSIS — T405X1A Poisoning by cocaine, accidental (unintentional), initial encounter: Secondary | ICD-10-CM | POA: Diagnosis not present

## 2023-01-10 DIAGNOSIS — Z91013 Allergy to seafood: Secondary | ICD-10-CM | POA: Diagnosis not present

## 2023-01-10 DIAGNOSIS — I959 Hypotension, unspecified: Secondary | ICD-10-CM

## 2023-01-10 DIAGNOSIS — R579 Shock, unspecified: Secondary | ICD-10-CM | POA: Diagnosis not present

## 2023-01-10 DIAGNOSIS — Z811 Family history of alcohol abuse and dependence: Secondary | ICD-10-CM | POA: Diagnosis not present

## 2023-01-10 DIAGNOSIS — K567 Ileus, unspecified: Secondary | ICD-10-CM | POA: Diagnosis present

## 2023-01-10 DIAGNOSIS — R748 Abnormal levels of other serum enzymes: Secondary | ICD-10-CM

## 2023-01-10 DIAGNOSIS — Z803 Family history of malignant neoplasm of breast: Secondary | ICD-10-CM

## 2023-01-10 DIAGNOSIS — Z87891 Personal history of nicotine dependence: Secondary | ICD-10-CM

## 2023-01-10 DIAGNOSIS — F1410 Cocaine abuse, uncomplicated: Secondary | ICD-10-CM | POA: Diagnosis not present

## 2023-01-10 DIAGNOSIS — F32A Depression, unspecified: Secondary | ICD-10-CM | POA: Diagnosis not present

## 2023-01-10 DIAGNOSIS — A419 Sepsis, unspecified organism: Secondary | ICD-10-CM | POA: Diagnosis not present

## 2023-01-10 DIAGNOSIS — G928 Other toxic encephalopathy: Secondary | ICD-10-CM | POA: Diagnosis not present

## 2023-01-10 DIAGNOSIS — J189 Pneumonia, unspecified organism: Secondary | ICD-10-CM

## 2023-01-10 DIAGNOSIS — I10 Essential (primary) hypertension: Secondary | ICD-10-CM | POA: Diagnosis present

## 2023-01-10 DIAGNOSIS — R4182 Altered mental status, unspecified: Principal | ICD-10-CM

## 2023-01-10 DIAGNOSIS — J690 Pneumonitis due to inhalation of food and vomit: Secondary | ICD-10-CM | POA: Diagnosis not present

## 2023-01-10 DIAGNOSIS — G929 Unspecified toxic encephalopathy: Secondary | ICD-10-CM

## 2023-01-10 DIAGNOSIS — F1310 Sedative, hypnotic or anxiolytic abuse, uncomplicated: Secondary | ICD-10-CM | POA: Diagnosis present

## 2023-01-10 DIAGNOSIS — E872 Acidosis, unspecified: Secondary | ICD-10-CM | POA: Diagnosis present

## 2023-01-10 DIAGNOSIS — K7200 Acute and subacute hepatic failure without coma: Secondary | ICD-10-CM | POA: Diagnosis present

## 2023-01-10 DIAGNOSIS — I214 Non-ST elevation (NSTEMI) myocardial infarction: Secondary | ICD-10-CM | POA: Diagnosis not present

## 2023-01-10 DIAGNOSIS — T424X1A Poisoning by benzodiazepines, accidental (unintentional), initial encounter: Secondary | ICD-10-CM | POA: Diagnosis present

## 2023-01-10 LAB — I-STAT ARTERIAL BLOOD GAS, ED
Acid-base deficit: 5 mmol/L — ABNORMAL HIGH (ref 0.0–2.0)
Bicarbonate: 23.1 mmol/L (ref 20.0–28.0)
Calcium, Ion: 1.05 mmol/L — ABNORMAL LOW (ref 1.15–1.40)
HCT: 40 % (ref 39.0–52.0)
Hemoglobin: 13.6 g/dL (ref 13.0–17.0)
O2 Saturation: 97 %
Patient temperature: 98.6
Potassium: 4.1 mmol/L (ref 3.5–5.1)
Sodium: 137 mmol/L (ref 135–145)
TCO2: 25 mmol/L (ref 22–32)
pCO2 arterial: 53.5 mmHg — ABNORMAL HIGH (ref 32–48)
pH, Arterial: 7.244 — ABNORMAL LOW (ref 7.35–7.45)
pO2, Arterial: 108 mmHg (ref 83–108)

## 2023-01-10 LAB — I-STAT CHEM 8, ED
BUN: 37 mg/dL — ABNORMAL HIGH (ref 6–20)
Calcium, Ion: 0.97 mmol/L — ABNORMAL LOW (ref 1.15–1.40)
Chloride: 101 mmol/L (ref 98–111)
Creatinine, Ser: 4.1 mg/dL — ABNORMAL HIGH (ref 0.61–1.24)
Glucose, Bld: 90 mg/dL (ref 70–99)
HCT: 46 % (ref 39.0–52.0)
Hemoglobin: 15.6 g/dL (ref 13.0–17.0)
Potassium: 4.1 mmol/L (ref 3.5–5.1)
Sodium: 139 mmol/L (ref 135–145)
TCO2: 23 mmol/L (ref 22–32)

## 2023-01-10 LAB — COMPREHENSIVE METABOLIC PANEL
ALT: 2154 U/L — ABNORMAL HIGH (ref 0–44)
AST: 2316 U/L — ABNORMAL HIGH (ref 15–41)
Albumin: 3.6 g/dL (ref 3.5–5.0)
Alkaline Phosphatase: 73 U/L (ref 38–126)
Anion gap: 20 — ABNORMAL HIGH (ref 5–15)
BUN: 37 mg/dL — ABNORMAL HIGH (ref 6–20)
CO2: 21 mmol/L — ABNORMAL LOW (ref 22–32)
Calcium: 8 mg/dL — ABNORMAL LOW (ref 8.9–10.3)
Chloride: 99 mmol/L (ref 98–111)
Creatinine, Ser: 3.86 mg/dL — ABNORMAL HIGH (ref 0.61–1.24)
GFR, Estimated: 19 mL/min — ABNORMAL LOW (ref >60)
Glucose, Bld: 103 mg/dL — ABNORMAL HIGH (ref 70–99)
Potassium: 4.1 mmol/L (ref 3.5–5.1)
Sodium: 140 mmol/L (ref 135–145)
Total Bilirubin: 0.7 mg/dL (ref 0.3–1.2)
Total Protein: 6.6 g/dL (ref 6.5–8.1)

## 2023-01-10 LAB — CBC WITH DIFFERENTIAL/PLATELET
Abs Immature Granulocytes: 0 10*3/uL (ref 0.00–0.07)
Basophils Absolute: 0 10*3/uL (ref 0.0–0.1)
Basophils Relative: 0 %
Eosinophils Absolute: 0 10*3/uL (ref 0.0–0.5)
Eosinophils Relative: 0 %
HCT: 46.6 % (ref 39.0–52.0)
Hemoglobin: 14.7 g/dL (ref 13.0–17.0)
Immature Granulocytes: 0 %
Lymphocytes Relative: 20 %
Lymphs Abs: 0.6 10*3/uL — ABNORMAL LOW (ref 0.7–4.0)
MCH: 29.6 pg (ref 26.0–34.0)
MCHC: 31.5 g/dL (ref 30.0–36.0)
MCV: 93.8 fL (ref 80.0–100.0)
Monocytes Absolute: 0.3 10*3/uL (ref 0.1–1.0)
Monocytes Relative: 12 %
Neutro Abs: 1.9 10*3/uL (ref 1.7–7.7)
Neutrophils Relative %: 68 %
Platelets: 277 10*3/uL (ref 150–400)
RBC: 4.97 MIL/uL (ref 4.22–5.81)
RDW: 14.3 % (ref 11.5–15.5)
WBC: 2.7 10*3/uL — ABNORMAL LOW (ref 4.0–10.5)
nRBC: 0 % (ref 0.0–0.2)

## 2023-01-10 LAB — RAPID URINE DRUG SCREEN, HOSP PERFORMED
Amphetamines: NOT DETECTED
Barbiturates: NOT DETECTED
Benzodiazepines: POSITIVE — AB
Cocaine: POSITIVE — AB
Opiates: NOT DETECTED
Tetrahydrocannabinol: NOT DETECTED

## 2023-01-10 LAB — BASIC METABOLIC PANEL
Anion gap: 16 — ABNORMAL HIGH (ref 5–15)
BUN: 34 mg/dL — ABNORMAL HIGH (ref 6–20)
CO2: 23 mmol/L (ref 22–32)
Calcium: 7.4 mg/dL — ABNORMAL LOW (ref 8.9–10.3)
Chloride: 100 mmol/L (ref 98–111)
Creatinine, Ser: 3.08 mg/dL — ABNORMAL HIGH (ref 0.61–1.24)
GFR, Estimated: 25 mL/min — ABNORMAL LOW (ref >60)
Glucose, Bld: 109 mg/dL — ABNORMAL HIGH (ref 70–99)
Potassium: 4.2 mmol/L (ref 3.5–5.1)
Sodium: 139 mmol/L (ref 135–145)

## 2023-01-10 LAB — CK: Total CK: 2516 U/L — ABNORMAL HIGH (ref 49–397)

## 2023-01-10 LAB — TROPONIN I (HIGH SENSITIVITY)
Troponin I (High Sensitivity): 188 ng/L (ref <18)
Troponin I (High Sensitivity): 752 ng/L (ref <18)

## 2023-01-10 LAB — URINALYSIS, ROUTINE W REFLEX MICROSCOPIC
Bilirubin Urine: NEGATIVE
Glucose, UA: NEGATIVE mg/dL
Ketones, ur: NEGATIVE mg/dL
Leukocytes,Ua: NEGATIVE
Nitrite: NEGATIVE
Protein, ur: 100 mg/dL — AB
Specific Gravity, Urine: 1.016 (ref 1.005–1.030)
pH: 5 (ref 5.0–8.0)

## 2023-01-10 LAB — LIPASE, BLOOD: Lipase: 27 U/L (ref 11–51)

## 2023-01-10 LAB — TYPE AND SCREEN
ABO/RH(D): A POS
Antibody Screen: NEGATIVE

## 2023-01-10 LAB — GLUCOSE, CAPILLARY
Glucose-Capillary: 102 mg/dL — ABNORMAL HIGH (ref 70–99)
Glucose-Capillary: 93 mg/dL (ref 70–99)
Glucose-Capillary: 110 mg/dL — ABNORMAL HIGH (ref 70–99)

## 2023-01-10 LAB — SALICYLATE LEVEL: Salicylate Lvl: <7 mg/dL — ABNORMAL LOW (ref 7.0–30.0)

## 2023-01-10 LAB — PROTIME-INR
INR: 1.3 — ABNORMAL HIGH (ref 0.8–1.2)
Prothrombin Time: 16.4 seconds — ABNORMAL HIGH (ref 11.4–15.2)

## 2023-01-10 LAB — CBG MONITORING, ED: Glucose-Capillary: 101 mg/dL — ABNORMAL HIGH (ref 70–99)

## 2023-01-10 LAB — LACTIC ACID, PLASMA
Lactic Acid, Venous: 5.6 mmol/L (ref 0.5–1.9)
Lactic Acid, Venous: 4.5 mmol/L (ref 0.5–1.9)

## 2023-01-10 LAB — PROCALCITONIN: Procalcitonin: 20.1 ng/mL

## 2023-01-10 LAB — ACETAMINOPHEN LEVEL: Acetaminophen (Tylenol), Serum: <10 ug/mL — ABNORMAL LOW (ref 10–30)

## 2023-01-10 LAB — ETHANOL: Alcohol, Ethyl (B): <10 mg/dL (ref <10)

## 2023-01-10 LAB — BRAIN NATRIURETIC PEPTIDE: B Natriuretic Peptide: 141.3 pg/mL — ABNORMAL HIGH (ref 0.0–100.0)

## 2023-01-10 LAB — HIV ANTIBODY (ROUTINE TESTING W REFLEX): HIV Screen 4th Generation wRfx: NONREACTIVE

## 2023-01-10 MED ORDER — ORAL CARE MOUTH RINSE: 15.0000 mL | OROMUCOSAL | Status: DC | PRN

## 2023-01-10 MED ORDER — FAMOTIDINE 20 MG PO TABS: 20.0000 mg | ORAL_TABLET | Freq: Two times a day (BID) | ORAL | Status: DC

## 2023-01-10 MED ORDER — ETOMIDATE 2 MG/ML IV SOLN
INTRAVENOUS | Status: AC | PRN
  Administered 2023-01-10: 20 mg via INTRAVENOUS

## 2023-01-10 MED ORDER — LEVOFLOXACIN IN D5W 750 MG/150ML IV SOLN: 750.0000 mg | INTRAVENOUS | Status: DC

## 2023-01-10 MED ORDER — FENTANYL CITRATE PF 50 MCG/ML IJ SOSY
50.0000 ug | PREFILLED_SYRINGE | Freq: Once | INTRAMUSCULAR | Status: AC
  Administered 2023-01-10: 50 ug via INTRAVENOUS
  Filled 2023-01-10: qty 1

## 2023-01-10 MED ORDER — MIDODRINE HCL 5 MG PO TABS
10.0000 mg | ORAL_TABLET | Freq: Three times a day (TID) | ORAL | Status: DC
  Administered 2023-01-10 – 2023-01-11 (×2): 10 mg
  Filled 2023-01-10 (×2): qty 2

## 2023-01-10 MED ORDER — FAMOTIDINE IN NACL 20-0.9 MG/50ML-% IV SOLN
20.0000 mg | Freq: Two times a day (BID) | INTRAVENOUS | Status: DC
  Administered 2023-01-10: 20 mg via INTRAVENOUS
  Filled 2023-01-10 (×2): qty 50

## 2023-01-10 MED ORDER — SUCCINYLCHOLINE CHLORIDE 20 MG/ML IJ SOLN
INTRAMUSCULAR | Status: AC | PRN
  Administered 2023-01-10: 140 mg via INTRAVENOUS

## 2023-01-10 MED ORDER — DOCUSATE SODIUM 50 MG/5ML PO LIQD
100.0000 mg | Freq: Every day | ORAL | Status: DC | PRN
  Administered 2023-01-16: 100 mg
  Filled 2023-01-10: qty 10

## 2023-01-10 MED ORDER — FENTANYL 2500MCG IN NS 250ML (10MCG/ML) PREMIX INFUSION
50.0000 ug/h | INTRAVENOUS | Status: DC
  Administered 2023-01-10: 50 ug/h via INTRAVENOUS
  Administered 2023-01-11: 100 ug/h via INTRAVENOUS
  Filled 2023-01-10 (×2): qty 250

## 2023-01-10 MED ORDER — LACTATED RINGERS IV SOLN: INTRAVENOUS | Status: DC

## 2023-01-10 MED ORDER — NOREPINEPHRINE 4 MG/250ML-% IV SOLN
2.0000 ug/min | INTRAVENOUS | Status: DC
  Administered 2023-01-10: 10 ug/min via INTRAVENOUS
  Filled 2023-01-10: qty 250

## 2023-01-10 MED ORDER — PROPOFOL 1000 MG/100ML IV EMUL
5.0000 ug/kg/min | INTRAVENOUS | Status: DC
  Administered 2023-01-10: 20 ug/kg/min via INTRAVENOUS
  Filled 2023-01-10 (×3): qty 100

## 2023-01-10 MED ORDER — ORAL CARE MOUTH RINSE
15.0000 mL | OROMUCOSAL | Status: DC
  Administered 2023-01-10 – 2023-01-13 (×33): 15 mL via OROMUCOSAL

## 2023-01-10 MED ORDER — DOCUSATE SODIUM 100 MG PO CAPS
100.0000 mg | ORAL_CAPSULE | Freq: Two times a day (BID) | ORAL | Status: DC | PRN
Start: 2023-01-10 — End: 2023-01-10

## 2023-01-10 MED ORDER — LACTATED RINGERS IV BOLUS
500.0000 mL | Freq: Once | INTRAVENOUS | Status: AC
  Administered 2023-01-10: 500 mL via INTRAVENOUS

## 2023-01-10 MED ORDER — SODIUM CHLORIDE 0.9 % IV BOLUS
500.0000 mL | Freq: Once | INTRAVENOUS | Status: AC
  Administered 2023-01-10: 500 mL via INTRAVENOUS

## 2023-01-10 MED ORDER — CALCIUM GLUCONATE-NACL 1-0.675 GM/50ML-% IV SOLN
1.0000 g | Freq: Once | INTRAVENOUS | Status: AC
  Administered 2023-01-10: 1000 mg via INTRAVENOUS
  Filled 2023-01-10: qty 50

## 2023-01-10 MED ORDER — LEVOFLOXACIN IN D5W 750 MG/150ML IV SOLN
750.0000 mg | INTRAVENOUS | Status: DC
  Administered 2023-01-10: 750 mg via INTRAVENOUS
  Filled 2023-01-10 (×2): qty 150

## 2023-01-10 MED ORDER — POLYETHYLENE GLYCOL 3350 17 G PO PACK
17.0000 g | PACK | Freq: Every day | ORAL | Status: DC | PRN
  Filled 2023-01-10: qty 1

## 2023-01-10 MED ORDER — CLINDAMYCIN PHOSPHATE 600 MG/50ML IV SOLN
600.0000 mg | Freq: Once | INTRAVENOUS | Status: DC
  Filled 2023-01-10: qty 50

## 2023-01-10 MED ORDER — NOREPINEPHRINE 4 MG/250ML-% IV SOLN
INTRAVENOUS | Status: AC
  Administered 2023-01-10: 5 ug/min via INTRAVENOUS
  Filled 2023-01-10: qty 250

## 2023-01-10 MED ORDER — SODIUM CHLORIDE 0.9 % IV SOLN
250.0000 mL | INTRAVENOUS | Status: DC
  Administered 2023-01-10: 250 mL via INTRAVENOUS

## 2023-01-10 MED ORDER — SODIUM CHLORIDE 0.9 % IV SOLN
10.0000 mg | INTRAVENOUS | Status: DC
  Filled 2023-01-10: qty 1

## 2023-01-10 MED ORDER — FENTANYL BOLUS VIA INFUSION: 50.0000 ug | INTRAVENOUS | Status: DC | PRN

## 2023-01-10 MED ORDER — PROPOFOL 1000 MG/100ML IV EMUL
INTRAVENOUS | Status: AC
  Administered 2023-01-10: 20 ug/kg/min via INTRAVENOUS
  Filled 2023-01-10: qty 100

## 2023-01-10 MED ORDER — SODIUM CHLORIDE 0.9 % IV BOLUS
1000.0000 mL | Freq: Once | INTRAVENOUS | Status: AC
  Administered 2023-01-10: 1000 mL via INTRAVENOUS

## 2023-01-10 NOTE — ED Notes (Signed)
Levo titrated to 14.  Pete-NP with [facility] gave verbal to titrate as high as 20 PRN but to inform him if it gets that high.  Central line likely

## 2023-01-10 NOTE — ED Triage Notes (Signed)
Pt [facility] for unresponsive/OD. A friend could not reach him.  Welfare check at hotel room found  pt unresponsive and breathing 4bpm. Pt ws given 1.5 Narcan and was quite agitated on arrival.  L.Hand noted to be swollen.  150/100 98% Hr 90 CBG 124

## 2023-01-10 NOTE — ED Provider Notes (Signed)
Randall Parsons EMERGENCY DEPARTMENT AT [facility] Provider Note   CSN: [id number] Arrival date & time: 01/10/23  1122     History  No chief complaint on file.   Randall Parsons is a 42 y.o. male.  He is brought in by mass for altered mental status.  Level 5 caveat.  Patient was found unresponsive in a motel room pinpoint pupils not breathing.  Given Narcan with some improvement in respiratory status but became combative.  Arrives combative jaw clenched not redirectable.  Patient was intubated for airway protection and hypoxia.  Fingerstick blood sugar was 130s.  The history is provided by the EMS personnel.  Altered Mental Status Presenting symptoms: combativeness and unresponsiveness   Severity:  Severe Timing:  Constant Progression:  Unchanged      Home Medications Prior to Admission medications   Medication Sig Start Date End Date Taking? Authorizing Provider  acetaminophen (TYLENOL) 500 MG tablet Take 1 tablet (500 mg total) by mouth every 6 (six) hours as needed. 04/27/22   Randall Helper, PA-C  albuterol (VENTOLIN HFA) 108 (90 Base) MCG/ACT inhaler Inhale 2 puffs into the lungs every 6 (six) hours as needed for wheezing or shortness of breath (shortness of breath or wheezing). INHALE 1-2 PUFFS BY MOUTH EVERY 6 HOURS AS NEEDED FOR WHEEZE OR SHORTNESS OF BREATH Strength: 108 (90 Base) MCG/ACT 12/20/21   Randall Husk, MD  buPROPion [facility] SR) 150 MG 12 hr tablet Take 1 tablet (150 mg total) by mouth 2 (two) times daily. 12/20/21 01/19/22  Randall Husk, MD  doxepin (SINEQUAN) 10 MG capsule Take 1 capsule (10 mg total) by mouth at bedtime as needed. 12/20/21 01/19/22  Randall Husk, MD  gabapentin (NEURONTIN) 600 MG tablet Take 2 tablets (1,200 mg total) by mouth 2 (two) times daily. 12/20/21   Randall Husk, MD  lamoTRIgine (LAMICTAL) 25 MG tablet Take 1 tablet (25 mg total) by mouth daily. 12/21/21 01/20/22  Randall Husk, MD  lidocaine  (XYLOCAINE) 2 % solution Use as directed 15 mLs in the mouth or throat as needed for mouth pain. 04/27/22   Randall Helper, PA-C  pantoprazole (PROTONIX) 40 MG tablet Take 1 tablet (40 mg total) by mouth daily. 07/17/19 08/03/19  Clapacs, Jackquline Denmark, MD      Allergies    Amoxicillin and Prednisone    Review of Systems   Review of Systems  Unable to perform ROS: Mental status change    Physical Exam Updated Vital Signs BP (!) 120/107   Pulse 97   Temp 98 F (36.7 C)   Resp (!) 27   Ht 5\' 9"  (1.753 m)   Wt 81.8 kg Comment: estimated in ED  SpO2 99%   BMI 26.63 kg/m  Physical Exam Vitals and nursing note reviewed.  Constitutional:      General: He is in acute distress.     Appearance: Normal appearance. He is well-developed.  HENT:     Head: Normocephalic and atraumatic.  Eyes:     Conjunctiva/sclera: Conjunctivae normal.     Comments: Pupils were small and equal  Cardiovascular:     Rate and Rhythm: Normal rate and regular rhythm.     Heart sounds: No murmur heard. Pulmonary:     Effort: Tachypnea present.     Breath sounds: Decreased air movement present.  Abdominal:     Palpations: Abdomen is soft.     Tenderness: There is no abdominal tenderness. There is no guarding or rebound.  Musculoskeletal:        General: No deformity. Normal range of motion.     Cervical back: Neck supple.  Skin:    General: Skin is warm and dry.     Capillary Refill: Capillary refill takes less than 2 seconds.  Neurological:     Comments: Patient was nonverbal.  He was not following commands.  He was intermittently combative and pulling at equipment.     ED Results / Procedures / Treatments   Labs (all labs ordered are listed, but only abnormal results are displayed) Labs Reviewed  URINALYSIS, ROUTINE W REFLEX MICROSCOPIC - Abnormal; Notable for the following components:      Result Value   Color, Urine AMBER (*)    APPearance CLOUDY (*)    Hgb urine dipstick LARGE (*)    Protein, ur  100 (*)    Bacteria, UA RARE (*)    All other components within normal limits  RAPID URINE DRUG SCREEN, HOSP PERFORMED - Abnormal; Notable for the following components:   Cocaine POSITIVE (*)    Benzodiazepines POSITIVE (*)    All other components within normal limits  CBC WITH DIFFERENTIAL/PLATELET - Abnormal; Notable for the following components:   WBC 2.7 (*)    Lymphs Abs 0.6 (*)    All other components within normal limits  PROTIME-INR - Abnormal; Notable for the following components:   Prothrombin Time 16.4 (*)    INR 1.3 (*)    All other components within normal limits  COMPREHENSIVE METABOLIC PANEL - Abnormal; Notable for the following components:   CO2 21 (*)    Glucose, Bld 103 (*)    BUN 37 (*)    Creatinine, Ser 3.86 (*)    Calcium 8.0 (*)    AST 2,316 (*)    ALT 2,154 (*)    GFR, Estimated 19 (*)    Anion gap 20 (*)    All other components within normal limits  LACTIC ACID, PLASMA - Abnormal; Notable for the following components:   Lactic Acid, Venous 5.6 (*)    All other components within normal limits  ACETAMINOPHEN LEVEL - Abnormal; Notable for the following components:   Acetaminophen (Tylenol), Serum <10 (*)    All other components within normal limits  SALICYLATE LEVEL - Abnormal; Notable for the following components:   Salicylate Lvl <7.0 (*)    All other components within normal limits  BRAIN NATRIURETIC PEPTIDE - Abnormal; Notable for the following components:   B Natriuretic Peptide 141.3 (*)    All other components within normal limits  CK - Abnormal; Notable for the following components:   Total CK 2,516 (*)    All other components within normal limits  GLUCOSE, CAPILLARY - Abnormal; Notable for the following components:   Glucose-Capillary 102 (*)    All other components within normal limits  CBG MONITORING, ED - Abnormal; Notable for the following components:   Glucose-Capillary 101 (*)    All other components within normal limits  I-STAT CHEM  8, ED - Abnormal; Notable for the following components:   BUN 37 (*)    Creatinine, Ser 4.10 (*)    Calcium, Ion 0.97 (*)    All other components within normal limits  I-STAT ARTERIAL BLOOD GAS, ED - Abnormal; Notable for the following components:   pH, Arterial 7.244 (*)    pCO2 arterial 53.5 (*)    Acid-base deficit 5.0 (*)    Calcium, Ion 1.05 (*)    All other components  within normal limits  TROPONIN I (HIGH SENSITIVITY) - Abnormal; Notable for the following components:   Troponin I (High Sensitivity) 188 (*)    All other components within normal limits  CULTURE, BLOOD (ROUTINE X 2)  CULTURE, BLOOD (ROUTINE X 2)  CULTURE, BLOOD (ROUTINE X 2)  CULTURE, RESPIRATORY W GRAM STAIN  LIPASE, BLOOD  ETHANOL  LACTIC ACID, PLASMA  HIV ANTIBODY (ROUTINE TESTING W REFLEX)  PROCALCITONIN  STREP PNEUMONIAE URINARY ANTIGEN  BASIC METABOLIC PANEL  CALCIUM, IONIZED  CBC  BASIC METABOLIC PANEL  BLOOD GAS, ARTERIAL  MAGNESIUM  PHOSPHORUS  I-STAT VENOUS BLOOD GAS, ED  TYPE AND SCREEN  TROPONIN I (HIGH SENSITIVITY)    EKG EKG Interpretation  Date/Time:  Friday January 10 2023 11:25:20 EDT Ventricular Rate:  91 PR Interval:  139 QRS Duration: 90 QT Interval:  378 QTC Calculation: 466 R Axis:   102 Text Interpretation: Sinus rhythm Right axis deviation rate is faster than prior 3/23 Confirmed by Meridee ScoreButler, Mikah Rottinghaus [phone]([id number]) on 01/10/2023 11:42:04 AM  Radiology CT CHEST ABDOMEN PELVIS WO CONTRAST  Result Date: 01/10/2023 CLINICAL DATA:  Sepsis, OD/sepsis EXAM: CT CHEST, ABDOMEN AND PELVIS WITHOUT CONTRAST TECHNIQUE: Multidetector CT imaging of the chest, abdomen and pelvis was performed following the standard protocol without IV contrast. RADIATION DOSE REDUCTION: This exam was performed according to the departmental dose-optimization program which includes automated exposure control, adjustment of the mA and/or kV according to patient size and/or use of iterative reconstruction technique.  COMPARISON:  Chest radiograph performed earlier on the same date. FINDINGS: CT CHEST FINDINGS Cardiovascular: No significant vascular findings. Normal heart size. No pericardial effusion. Mediastinum/Nodes: No enlarged mediastinal, hilar, or axillary lymph nodes. Thyroid gland, trachea, and esophagus demonstrate no significant findings. Endotracheal tube and feeding tube coursing below the diaphragm in the body of the stomach. Lungs/Pleura: Bilateral multifocal lung opacities prominent in the dependent portion of the bilateral lower lobes with air bronchograms consistent with multifocal pneumonia. No pleural effusion or pneumothorax. Musculoskeletal: No chest wall mass or suspicious bone lesions identified. CT ABDOMEN PELVIS FINDINGS Hepatobiliary: No focal liver abnormality is seen. No gallstones, gallbladder wall thickening, or biliary dilatation. Pancreas: Unremarkable. No pancreatic ductal dilatation or surrounding inflammatory changes. Spleen: Normal in size without focal abnormality. Adrenals/Urinary Tract: Adrenal glands are unremarkable. Punctate nonobstructing calculus in the upper pole of the left kidney. There is also 4 mm nonobstructing calculus in the interpolar region of the right kidney. No hydronephrosis or ureteral calculus. Foley's catheter in place, urinary bladder is otherwise unremarkable. Stomach/Bowel: Stomach is within normal limits. NG tube with distal tip terminating the body of the stomach. Small bowel loops are normal in caliber. Appendix not identified, however no inflammatory changes in the pericecal region. No evidence of bowel wall thickening, distention, or inflammatory changes. Vascular/Lymphatic: No significant vascular findings are present. No enlarged abdominal or pelvic lymph nodes. Reproductive: Prostate is unremarkable. Other: No abdominal wall hernia or abnormality. No abdominopelvic ascites. Musculoskeletal: No acute or significant osseous findings. IMPRESSION: 1. Bilateral  multifocal lung opacities prominent in the dependent portion of the bilateral lower lobes with air bronchograms consistent with multifocal pneumonia. Follow-up examination to resolution is recommended. 2. Endotracheal tube, feeding tube and Foley's catheter are in appropriate position. 3. No evidence of bowel obstruction. No evidence of colitis or diverticulitis. 4. Bilateral nonobstructing renal calculi. No hydronephrosis or ureteral calculus. Electronically Signed   By: Larose HiresImran  Ahmed D.O.   On: 01/10/2023 15:16   CT Head Wo Contrast  Result Date: 01/10/2023 CLINICAL  DATA:  Provided history: Mental status change, unknown cause. EXAM: CT HEAD WITHOUT CONTRAST TECHNIQUE: Contiguous axial images were obtained from the base of the skull through the vertex without intravenous contrast. RADIATION DOSE REDUCTION: This exam was performed according to the departmental dose-optimization program which includes automated exposure control, adjustment of the mA and/or kV according to patient size and/or use of iterative reconstruction technique. COMPARISON:  No pertinent prior exams available for comparison. FINDINGS: Brain: No age advanced or lobar predominant parenchymal atrophy. 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 There is no acute intracranial hemorrhage. No demarcated cortical infarct. No extra-axial fluid collection. No evidence of an intracranial mass. No midline shift. Vascular: No hyperdense vessel. Atherosclerotic calcifications. Skull: No fracture or aggressive osseous lesion. Sinuses/Orbits: No mass or acute finding within the imaged orbits. No significant paranasal sinus disease. Other: Partially imaged life-support tubes. Secretions within the nasopharynx. IMPRESSION: 1.  No evidence of an acute intracranial abnormality. 2. Chronic right basal ganglia lacunar infarct. Electronically Signed   By: Jackey Loge D.O.   On: 01/10/2023 14:59   DG Chest Port 1 View  Result Date:  01/10/2023 CLINICAL DATA:  Intubation EXAM: PORTABLE CHEST 1 VIEW COMPARISON:  Chest radiograph 07/02/2021 FINDINGS: Endotracheal tube terminates approximately 4 cm above the carina. Enteric tube courses below diaphragm with the tip projecting over the location of the stomach and the side hole at the level of the GE junction. No pleural effusion. No pneumothorax. Normal cardiac and mediastinal contours. There are prominent bilateral perihilar airspace opacities, which could represent pulmonary edema or atypical infection. No radiographically apparent displaced rib fractures. IMPRESSION: 1. Prominent bilateral perihilar airspace opacities, which could represent pulmonary edema or atypical infection. If the patient is immunocompromised, fungal infection is also a differential consideration. 2. Endotracheal tube terminates approximately 4 cm above the carina. 3. Enteric tube side hole at the level of the GE junction, recommend advancement by 5 cm. Electronically Signed   By: Lorenza Cambridge M.D.   On: 01/10/2023 12:04    Procedures .Critical Care  Performed by: Terrilee Files, MD Authorized by: Terrilee Files, MD   Critical care provider statement:    Critical care time (minutes):  45   Critical care time was exclusive of:  Separately billable procedures and treating other patients   Critical care was necessary to treat or prevent imminent or life-threatening deterioration of the following conditions:  CNS failure or compromise   Critical care was time spent personally by me on the following activities:  Development of treatment plan with patient or surrogate, discussions with consultants, evaluation of patient's response to treatment, examination of patient, obtaining history from patient or surrogate, ordering and performing treatments and interventions, ordering and review of laboratory studies, ordering and review of radiographic studies, pulse oximetry, re-evaluation of patient's condition and review  of old charts   I assumed direction of critical care for this patient from another provider in my specialty: no   Procedure Name: Intubation Date/Time: 01/10/2023 5:22 PM  Performed by: Terrilee Files, MDPre-anesthesia Checklist: Patient identified, Patient being monitored, Emergency Drugs available, Timeout performed and Suction available Oxygen Delivery Method: Non-rebreather mask Preoxygenation: Pre-oxygenation with 100% oxygen Induction Type: Rapid sequence Ventilation: Mask ventilation without difficulty Laryngoscope Size: Glidescope and 3 Grade View: Grade II Number of attempts: 1 Placement Confirmation: ETT inserted through vocal cords under direct vision, CO2 detector and Breath sounds checked- equal and bilateral Secured at: 25 cm Tube secured with: ETT holder Dental Injury: Teeth  and Oropharynx as per pre-operative assessment  Difficulty Due To: Difficulty was anticipated        Medications Ordered in ED Medications  propofol (DIPRIVAN) 1000 MG/100ML infusion (25 mcg/kg/min  81.8 kg Intravenous Infusion Verify 01/10/23 1337)  fentaNYL in NS (5mcg/ml) infusion-PREMIX (125 mcg/hr Intravenous Infusion Verify 01/10/23 1315)  fentaNYL (SUBLIMAZE) bolus via infusion 50-100 mcg (has no administration in time range)  0.9 %  sodium chloride infusion (250 mLs Intravenous New Bag/Given 01/10/23 1557)  norepinephrine (LEVOPHED) 4mg  in (0.016 mg/mL) premix infusion (14 mcg/min Intravenous Infusion Verify 01/10/23 1336)  polyethylene glycol (MIRALAX / GLYCOLAX) packet 17 g (has no administration in time range)  lactated ringers infusion ( Intravenous New Bag/Given 01/10/23 1600)  Oral care mouth rinse (15 mLs Mouth Rinse Given 01/10/23 1500)  Oral care mouth rinse (has no administration in time range)  docusate (COLACE) 50 MG/5ML liquid 100 mg (has no administration in time range)  levofloxacin (LEVAQUIN) IVPB 750 mg (750 mg Intravenous New Bag/Given 01/10/23 1514)   famotidine (PEPCID) 10 mg in sodium chloride 0.9 % 25 mL (has no administration in time range)  sodium chloride 0.9 % bolus 500 mL (0 mLs Intravenous Stopped 01/10/23 1228)  fentaNYL (SUBLIMAZE) injection 50 mcg (50 mcg Intravenous Given 01/10/23 1140)  sodium chloride 0.9 % bolus 1,000 mL (1,000 mLs Intravenous New Bag/Given 01/10/23 1302)  etomidate (AMIDATE) injection (20 mg Intravenous Given 01/10/23 1135)  succinylcholine (ANECTINE) injection (140 mg Intravenous Given 01/10/23 1135)  calcium gluconate 1 g/ 50 mL sodium chloride IVPB (1,000 mg Intravenous New Bag/Given 01/10/23 1545)  lactated ringers bolus 500 mL (0 mLs Intravenous Stopped 01/10/23 1558)    ED Course/ Medical Decision Making/ A&P Clinical Course as of 01/10/23 1722  Fri Jan 10, 2023  1153 Chest x-ray looking like multifocal pneumonia patient had some thick secretions when tracheally suctioned.  Allergy to amoxicillin, hives, ordered clindamycin. [MB]  1214 Discussed with critical care NP Tanja Port who will be down to see the patient in consult [MB]  1239 Patient's blood pressure is running soft although he is on a significant amount of sedation as he is agitated if he lightens up.  Will continue to monitor [MB]    Clinical Course User Index [MB] Terrilee Files, MD                             Medical Decision Making Amount and/or Complexity of Data Reviewed Labs: ordered. Radiology: ordered.  Risk Prescription drug management. Decision regarding hospitalization.   This patient complains of altered mental status combativeness; this involves an extensive number of treatment Options and is a complaint that carries with it a high risk of complications and morbidity. The differential includes overdose, withdrawal, stroke, bleed, metabolic derangement  I ordered, reviewed and interpreted labs, which included CBC with low white count stable hemoglobin, chemistries with elevated BUN and creatinine elevated LFTs, lactate  elevated, blood culture sent, CPK elevated, troponins elevated, talk screen positive for cocaine cocaine benzodiazepines I ordered medication medications for intubation and sedation, IV fluids IV antibiotics and reviewed PMP when indicated. I ordered imaging studies which included chest x-ray, CT head chest abdomen and pelvis and I independently    visualized and interpreted imaging which showed multifocal infiltrates Additional history obtained from EMS Previous records obtained and reviewed in epic prior visits for substance abuse I consulted critical care NP Babcock and discussed lab and imaging findings and discussed disposition.  Cardiac monitoring reviewed, normal sinus rhythm Social determinants considered, unable to obtain Critical Interventions: Intubation and management of patient's altered mental status with IV fluids IV antibiotics sedation  After the interventions stated above, I reevaluated the patient and found patient still to be critically ill although airway protected.  Blood pressure soft. Admission and further testing considered, patient will benefit from mission the hospital for further management.  Appreciate critical care evaluating and managing patient.         Final Clinical Impression(s) / ED Diagnoses Final diagnoses:  Altered mental status, unspecified altered mental status type  AKI (acute kidney injury)  Elevated CPK  Hypotension, unspecified hypotension type  Multifocal pneumonia    Rx / DC Orders ED Discharge Orders     None         Terrilee Files, MD 01/10/23 1726

## 2023-01-10 NOTE — H&P (Signed)
NAME:  Venia MinksHeath Erbes, MRN:  [id number], DOB:  [birth year], LOS: 0 ADMISSION DATE:  01/10/2023, CONSULTATION DATE:  4/5 REFERRING MD:  Charm BargesButler, CHIEF COMPLAINT:  presumed drug overdose    History of Present Illness:  This is a 42 year old male patient with history as outlined below.  He was found at a hotel on 4/5 by staff unresponsive, apneic with pinpoint pupils.  He was given Narcan in the field, after this became combative and staff was unable to redirect him.  On arrival to the ER he remained combative, his jaw was clenched, he was hypoxic, and ultimately intubated for airway protection. Initial blood glucose 101 Portable chest x-ray shows bilateral patchy airspace disease with endotracheal tube in satisfactory position Critical care asked to admit with working diagnosis of presumed drug overdose  Pertinent  Medical History  Polysubstance abuse, Hep C, IVDA, depression, ETOH, substance induced mood disorder   Significant Hospital Events: Including procedures, antibiotic start and stop dates in addition to other pertinent events   4/5 admitted after being found unresponsive hypoxic and agitated after Narcan. Intubated for airway protection. Started on Levaquin   Interim History / Subjective:  Sedated   Objective   Blood pressure (Abnormal) 83/58, pulse 80, resp. rate 20, height 5\' 9"  (1.753 m), weight 81.8 kg, SpO2 100 %.    Vent Mode: PRVC FiO2 (%):  [100 %] 100 % Set Rate:  [15 bmp] 15 bmp Vt Set:  [550 mL-560 mL] 560 mL PEEP:  [12 cmH20] 12 cmH20 Plateau Pressure:  [25 cmH20] 25 cmH20  No intake or output data in the 24 hours ending 01/10/23 1218 Filed Weights   01/10/23 1100  Weight: 81.8 kg    Examination: General: 42 year old male currently heavily sedated on vent  HENT: Pupils pinpoint orally intubated Lungs: coarse bilateral breath sounds  Cardiovascular: RRR Abdomen: soft Extremities: warm good pulses  Neuro: sedated does have cough. Prior to heavy sedation was  moving all ext. Purposeful but not re-directable  GU: cl yellow   Resolved Hospital Problem list     Assessment & Plan:  Acute toxic encephalopathy presumed secondary to overdose (not clear if intentional), superimposed known h/o polysubstance abuse, depression and IVDA Plan F/u UDS, f/u salicylate and apap level  F/u CT head PAD protocol; RASS goal -1  Acute hypoxic respiratory failure secondary to aspiration pneumonia and ineffective airway protection Plan Full vent support  VAP bundle PAD protocol  RASS goal -1 Respiratory culture  Levaquin (allergic to PCN)  Drug related hypotension Developed progressive hypotension after starting prop Plan Try to dec sedation w/ goal RASS -2 Getting fluid challenge IV norepi MAP goal > 65  Lactic acidosis  Plan Iv hydration/fluid bolus  Repeat lactate  Hep C Plan Contact precautions  Best Practice (right click and "Reselect all SmartList Selections" daily)   Diet/type: NPO DVT prophylaxis: SCD GI prophylaxis: H2B Lines: N/A Foley:  Yes, and it is still needed Code Status:  full code Last date of multidisciplinary goals of care discussion [pending ]  Labs   CBC: No results for input(s): "WBC", "NEUTROABS", "HGB", "HCT", "MCV", "PLT" in the last 168 hours.  Basic Metabolic Panel: No results for input(s): "NA", "K", "CL", "CO2", "GLUCOSE", "BUN", "CREATININE", "CALCIUM", "MG", "PHOS" in the last 168 hours. GFR: CrCl cannot be calculated (Patient's most recent lab result is older than the maximum 21 days allowed.). No results for input(s): "PROCALCITON", "WBC", "LATICACIDVEN" in the last 168 hours.  Liver Function Tests: No results for  input(s): "AST", "ALT", "ALKPHOS", "BILITOT", "PROT", "ALBUMIN" in the last 168 hours. No results for input(s): "LIPASE", "AMYLASE" in the last 168 hours. No results for input(s): "AMMONIA" in the last 168 hours.  ABG    Component Value Date/Time   TCO2 28 09/17/2017 1447      Coagulation Profile: No results for input(s): "INR", "PROTIME" in the last 168 hours.  Cardiac Enzymes: No results for input(s): "CKTOTAL", "CKMB", "CKMBINDEX", "TROPONINI" in the last 168 hours.  HbA1C: Hgb A1c MFr Bld  Date/Time Value Ref Range Status  12/17/2021 10:59 PM 5.9 (H) 4.8 - 5.6 % Final    Comment:    (NOTE)         Prediabetes: 5.7 - 6.4         Diabetes: >6.4         Glycemic control for adults with diabetes: <7.0   12/03/2020 04:54 PM 5.8 (H) 4.8 - 5.6 % Final    Comment:    (NOTE) Pre diabetes:          5.7%-6.4%  Diabetes:              >6.4%  Glycemic control for   <7.0% adults with diabetes     CBG: Recent Labs  Lab 01/10/23 1128  GLUCAP 101*    Review of Systems:   Unable   Past Medical History:  He,  has a past medical history of CHF (congestive heart failure) (HCC), Exposure to hepatitis B, Exposure to hepatitis C, Hepatitis C, History of opioid abuse (HCC), Hypertension, IV drug abuse (HCC), and Renal disorder.   Surgical History:   Past Surgical History:  Procedure Laterality Date   APPENDECTOMY     EYE SURGERY     secondary to dog bite   TIBIA FRACTURE SURGERY Right      Social History:   reports that he has quit smoking. He has never used smokeless tobacco. He reports that he does not currently use drugs after having used the following drugs: IV and Cocaine. He reports that he does not drink alcohol.   Family History:  His family history includes Alcohol abuse in his father; Breast cancer in his mother; Melanoma in his mother. There is no history of Colon cancer or Prostate cancer.   Allergies Allergies  Allergen Reactions   Amoxicillin Hives and Other (See Comments)    Did it involve swelling of the face/tongue/throat, SOB, or low BP? No Did it involve sudden or severe rash/hives, skin peeling, or any reaction on the inside of your mouth or nose? Yes Did you need to seek medical attention at a hospital or doctor's office?  Yes When did it last happen?     42 yrs old If all above answers are "NO", may proceed with cephalosporin use.    Prednisone Other (See Comments)    irritable     Home Medications  Prior to Admission medications   Medication Sig Start Date End Date Taking? Authorizing Provider  acetaminophen (TYLENOL) 500 MG tablet Take 1 tablet (500 mg total) by mouth every 6 (six) hours as needed. 04/27/22   Fayrene Helper, PA-C  albuterol (VENTOLIN HFA) 108 (90 Base) MCG/ACT inhaler Inhale 2 puffs into the lungs every 6 (six) hours as needed for wheezing or shortness of breath (shortness of breath or wheezing). INHALE 1-2 PUFFS BY MOUTH EVERY 6 HOURS AS NEEDED FOR WHEEZE OR SHORTNESS OF BREATH Strength: 108 (90 Base) MCG/ACT 12/20/21   Estella Husk, MD  buPROPion [facility] SR)  150 MG 12 hr tablet Take 1 tablet (150 mg total) by mouth 2 (two) times daily. 12/20/21 01/19/22  Estella Husk, MD  doxepin (SINEQUAN) 10 MG capsule Take 1 capsule (10 mg total) by mouth at bedtime as needed. 12/20/21 01/19/22  Estella Husk, MD  gabapentin (NEURONTIN) 600 MG tablet Take 2 tablets (1,200 mg total) by mouth 2 (two) times daily. 12/20/21   Estella Husk, MD  lamoTRIgine (LAMICTAL) 25 MG tablet Take 1 tablet (25 mg total) by mouth daily. 12/21/21 01/20/22  Estella Husk, MD  lidocaine (XYLOCAINE) 2 % solution Use as directed 15 mLs in the mouth or throat as needed for mouth pain. 04/27/22   Fayrene Helper, PA-C  pantoprazole (PROTONIX) 40 MG tablet Take 1 tablet (40 mg total) by mouth daily. 07/17/19 08/03/19  Clapacs, Jackquline Denmark, MD     Critical care time: 32 min

## 2023-01-10 NOTE — Progress Notes (Signed)
Pt taken from ED TRAA to CT03 to 2M15 by RN and RT w/o complications

## 2023-01-10 NOTE — Progress Notes (Signed)
eLink Physician-Brief Progress Note Patient Name: Randall Parsons DOB: 1980-11-17 MRN: [id number]   Date of Service  01/10/2023  HPI/Events of Note  42 year old male who initially presented today with aspiration pneumonia complicated by hypoxic respiratory failure with polysubstance abuse and IV drug use who is now sedated and mechanically ventilated.  Currently in shock, despite minimal sedation, ongoing hypotension requiring vasopressors.  Currently on levo 10 mcg maintaining MAP of 65-71.  eICU Interventions  Will add peripherally inserted central catheter  Add midodrine 10 mg 3 times daily  Extend peripheral levo to 15 mcg maximum.   0358 - Mag 1.7, Crt 2.20; Magnesium sulfate ordered  Intervention Category Intermediate Interventions: Hypotension - evaluation and management  Tawn Fitzner 01/10/2023, 7:36 PM

## 2023-01-10 NOTE — Progress Notes (Signed)
Pharmacy Antibiotic Note  Randall Parsons is a 42 y.o. male admitted on 01/10/2023 with pneumonia.  Pharmacy has been consulted for levofloxacin dosing.  WBC 2.7 SCr 4.1 (baseline SCr ~0.9-1.1) QTc 466  Plan: Levofloxacin 750mg  IV q48h Monitor daily CBC, temp, SCr, and for clinical signs of improvement  F/u cultures and de-escalate antibiotics as able   Height: 5\' 9"  (175.3 cm) Weight: 81.8 kg (180 lb 5.4 oz) (estimated in ED) IBW/kg (Calculated) : 70.7  No data recorded.  Recent Labs  Lab 01/10/23 1138 01/10/23 1140  WBC 2.7*  --   LATICACIDVEN  --  5.6*    CrCl cannot be calculated (Patient's most recent lab result is older than the maximum 21 days allowed.).    Allergies  Allergen Reactions   Amoxicillin Hives and Other (See Comments)    Did it involve swelling of the face/tongue/throat, SOB, or low BP? No Did it involve sudden or severe rash/hives, skin peeling, or any reaction on the inside of your mouth or nose? Yes Did you need to seek medical attention at a hospital or doctor's office? Yes When did it last happen?     42 yrs old If all above answers are "NO", may proceed with cephalosporin use.    Prednisone Other (See Comments)    irritable    Antimicrobials this admission: Levofloxacin 5/4 >>   Dose adjustments this admission: N/A  Microbiology results: 4/5 BCx: pending 4/5 MRSA PCR: pending  Thank you for allowing pharmacy to be a part of this patient's care.  Wilburn Cornelia, PharmD, BCPS Clinical Pharmacist 01/10/2023 1:11 PM   Please refer to [facility] for pharmacy phone number

## 2023-01-10 NOTE — Progress Notes (Signed)
Formatting of this note might be different from the original.  Pt taken from ED TRAA to CT03 to 2M15 by RN and RT w/o complications  Electronically signed by Allen Norris, RRT at 01/10/2023  2:42 PM EDT

## 2023-01-10 NOTE — Progress Notes (Signed)
Formatting of this note is different from the original.  Pharmacy Antibiotic Note    Lucas Hicks is a 42 y.o. male admitted on 01/10/2023 with pneumonia.  Pharmacy has been consulted for levofloxacin dosing.    WBC 2.7  SCr 4.1 (baseline SCr ~0.9-1.1)  QTc 466    Plan:  Levofloxacin 750mg  IV q48h  Monitor daily CBC, temp, SCr, and for clinical signs of improvement   F/u cultures and de-escalate antibiotics as able     Height: 5\' 9"  (175.3 cm)  Weight: 81.8 kg (180 lb 5.4 oz) (estimated in ED)  IBW/kg (Calculated) : 70.7    No data recorded.    Recent Labs   Lab 01/10/23  1138 01/10/23  1140   WBC 2.7*  --    LATICACIDVEN  --  5.6*     CrCl cannot be calculated (Patient's most recent lab result is older than the maximum 21 days allowed.).      Allergies   Allergen Reactions    Amoxicillin Hives and Other (See Comments)     Did it involve swelling of the face/tongue/throat, SOB, or low BP? No  Did it involve sudden or severe rash/hives, skin peeling, or any reaction on the inside of your mouth or nose? Yes  Did you need to seek medical attention at a hospital or doctor's office? Yes  When did it last happen?     43 yrs old  If all above answers are "NO", may proceed with cephalosporin use.     Prednisone Other (See Comments)     irritable     Antimicrobials this admission:  Levofloxacin 5/4 >>     Dose adjustments this admission:  N/A    Microbiology results:  4/5 BCx: pending  4/5 MRSA PCR: pending    Thank you for allowing pharmacy to be a part of this patient?s care.    Wilburn Cornelia, PharmD, BCPS  Clinical Pharmacist  01/10/2023 1:11 PM     Please refer to [facility] for pharmacy phone number   Electronically signed by Doristine Counter, RPH at 01/10/2023  2:31 PM EDT

## 2023-01-10 NOTE — ED Notes (Signed)
Formatting of this note might be different from the original.  Levo titrated to 14.  Pete-NP with CC gave verbal to titrate as high as 20 PRN but to inform him if it gets that high.  Central line likely  Electronically signed by Neomia Dear, RN at 01/10/2023  1:57 PM EDT

## 2023-01-10 NOTE — Progress Notes (Signed)
Formatting of this note is different from the original.  eLink Physician-Brief Progress Note  Patient Name: Jancarlo Biermann  DOB: 1981/06/28  MRN: [id number]    Date of Service   01/10/2023   HPI/Events of Note   42 year old male who initially presented today with aspiration pneumonia complicated by hypoxic respiratory failure with polysubstance abuse and IV drug use who is now sedated and mechanically ventilated.  Currently in shock, despite minimal sedation, ongoing hypotension requiring vasopressors.  Currently on levo 10 mcg maintaining MAP of 65-71.   eICU Interventions   Will add peripherally inserted central catheter    Add midodrine 10 mg 3 times daily    Extend peripheral levo to 15 mcg maximum.     0358 - Mag 1.7, Crt 2.20; Magnesium sulfate ordered    Intervention Category  Intermediate Interventions: Hypotension - evaluation and management    Aditya Paliwal  01/10/2023, 7:36 PM  Electronically signed by Conrad Burlington, MD at 01/11/2023  3:58 AM EDT

## 2023-01-10 NOTE — ED Triage Notes (Signed)
Formatting of this note might be different from the original.  Pt [facility] for unresponsive/OD. A friend could not reach him.  Welfare check at hotel room found  pt unresponsive and breathing 4bpm. Pt ws given 1.5 Narcan and was quite agitated on arrival.  L.Hand noted to be swollen.   150/100 98% Hr 90 CBG 124    Electronically signed by Neomia Dear, RN at 01/10/2023  1:31 PM EDT

## 2023-01-10 NOTE — H&P (Signed)
Formatting of this note is different from the original.  Images from the original note were not included.      NAME:  Lucas Hicks, MRN:  [id number], DOB:  03-21-1981, LOS: 0  ADMISSION DATE:  01/10/2023, CONSULTATION DATE:  4/5  REFERRING MD:  Charm Barges, CHIEF COMPLAINT:  presumed drug overdose      History of Present Illness:   This is a 42 year old male patient with history as outlined below.  He was found at a hotel on 4/5 by staff unresponsive, apneic with pinpoint pupils.  He was given Narcan in the field, after this became combative and staff was unable to redirect him.  On arrival to the ER he remained combative, his jaw was clenched, he was hypoxic, and ultimately intubated for airway protection.  Initial blood glucose 101  Portable chest x-ray shows bilateral patchy airspace disease with endotracheal tube in satisfactory position  Critical care asked to admit with working diagnosis of presumed drug overdose    Pertinent  Medical History   Polysubstance abuse, Hep C, IVDA, depression, ETOH, substance induced mood disorder     Significant Hospital Events:  Including procedures, antibiotic start and stop dates in addition to other pertinent events    4/5 admitted after being found unresponsive hypoxic and agitated after Narcan. Intubated for airway protection. Started on Levaquin     Interim History / Subjective:   Sedated     Objective    Blood pressure (Abnormal) 83/58, pulse 80, resp. rate 20, height 5\' 9"  (1.753 m), weight 81.8 kg, SpO2 100 %.    Vent Mode: PRVC  FiO2 (%):  [100 %] 100 %  Set Rate:  [15 bmp] 15 bmp  Vt Set:  [550 mL-560 mL] 560 mL  PEEP:  [12 cmH20] 12 cmH20  Plateau Pressure:  [25 cmH20] 25 cmH20   No intake or output data in the 24 hours ending 01/10/23 1218  Filed Weights    01/10/23 1100   Weight: 81.8 kg     Examination:  General: 42 year old male currently heavily sedated on vent   HENT: Pupils pinpoint orally intubated  Lungs: coarse bilateral breath sounds   Cardiovascular: RRR  Abdomen:  soft  Extremities: warm good pulses   Neuro: sedated does have cough. Prior to heavy sedation was moving all ext. Purposeful but not re-directable   GU: cl yellow     Resolved Hospital Problem list      Assessment & Plan:   Acute toxic encephalopathy presumed secondary to overdose (not clear if intentional), superimposed known h/o polysubstance abuse, depression and IVDA  Plan  F/u UDS, f/u salicylate and apap level   F/u CT head  PAD protocol; RASS goal -1    Acute hypoxic respiratory failure secondary to aspiration pneumonia and ineffective airway protection  Plan  Full vent support   VAP bundle  PAD protocol   RASS goal -1  Respiratory culture   Levaquin (allergic to PCN)    Drug related hypotension  Developed progressive hypotension after starting prop  Plan  Try to dec sedation w/ goal RASS -2  Getting fluid challenge  IV norepi MAP goal > 65    Lactic acidosis   Plan  Iv hydration/fluid bolus   Repeat lactate    Hep C  Plan  Contact precautions    Best Practice (right click and "Reselect all SmartList Selections" daily)     Diet/type: NPO  DVT prophylaxis: SCD  GI prophylaxis: H2B  Lines: N/A  Foley:  Yes, and it is still needed  Code Status:  full code  Last date of multidisciplinary goals of care discussion [pending ]    Labs    CBC:  No results for input(s): "WBC", "NEUTROABS", "HGB", "HCT", "MCV", "PLT" in the last 168 hours.    Basic Metabolic Panel:  No results for input(s): "NA", "K", "CL", "CO2", "GLUCOSE", "BUN", "CREATININE", "CALCIUM", "MG", "PHOS" in the last 168 hours.  GFR:  CrCl cannot be calculated (Patient's most recent lab result is older than the maximum 21 days allowed.).  No results for input(s): "PROCALCITON", "WBC", "LATICACIDVEN" in the last 168 hours.    Liver Function Tests:  No results for input(s): "AST", "ALT", "ALKPHOS", "BILITOT", "PROT", "ALBUMIN" in the last 168 hours.  No results for input(s): "LIPASE", "AMYLASE" in the last 168 hours.  No results for input(s): "AMMONIA" in  the last 168 hours.    ABG    Component Value Date/Time    TCO2 28 09/17/2017 1447       Coagulation Profile:  No results for input(s): "INR", "PROTIME" in the last 168 hours.    Cardiac Enzymes:  No results for input(s): "CKTOTAL", "CKMB", "CKMBINDEX", "TROPONINI" in the last 168 hours.    HbA1C:  Hgb A1c MFr Bld   Date/Time Value Ref Range Status   12/17/2021 10:59 PM 5.9 (H) 4.8 - 5.6 % Final     Comment:     (NOTE)          Prediabetes: 5.7 - 6.4          Diabetes: >6.4          Glycemic control for adults with diabetes: <7.0    12/03/2020 04:54 PM 5.8 (H) 4.8 - 5.6 % Final     Comment:     (NOTE)  Pre diabetes:          5.7%-6.4%    Diabetes:              >6.4%    Glycemic control for   <7.0%  adults with diabetes      CBG:  Recent Labs   Lab 01/10/23  1128   GLUCAP 101*     Review of Systems:    Unable     Past Medical History:   He,  has a past medical history of CHF (congestive heart failure) (HCC), Exposure to hepatitis B, Exposure to hepatitis C, Hepatitis C, History of opioid abuse (HCC), Hypertension, IV drug abuse (HCC), and Renal disorder.     Surgical History:     Past Surgical History:   Procedure Laterality Date    APPENDECTOMY      EYE SURGERY      secondary to dog bite    TIBIA FRACTURE SURGERY Right        Social History:    reports that he has quit smoking. He has never used smokeless tobacco. He reports that he does not currently use drugs after having used the following drugs: IV and Cocaine. He reports that he does not drink alcohol.     Family History:   His family history includes Alcohol abuse in his father; Breast cancer in his mother; Melanoma in his mother. There is no history of Colon cancer or Prostate cancer.     Allergies  Allergies   Allergen Reactions    Amoxicillin Hives and Other (See Comments)     Did it involve swelling of the face/tongue/throat, SOB, or low BP? No  Did it involve sudden  or severe rash/hives, skin peeling, or any reaction on the inside of your mouth or nose?  Yes  Did you need to seek medical attention at a hospital or doctor's office? Yes  When did it last happen?     42 yrs old  If all above answers are "NO", may proceed with cephalosporin use.     Prednisone Other (See Comments)     irritable       Home Medications   Prior to Admission medications    Medication Sig Start Date End Date Taking? Authorizing Provider   acetaminophen (TYLENOL) 500 MG tablet Take 1 tablet (500 mg total) by mouth every 6 (six) hours as needed. 04/27/22   Fayrene Helper, PA-C   albuterol (VENTOLIN HFA) 108 (90 Base) MCG/ACT inhaler Inhale 2 puffs into the lungs every 6 (six) hours as needed for wheezing or shortness of breath (shortness of breath or wheezing). INHALE 1-2 PUFFS BY MOUTH EVERY 6 HOURS AS NEEDED FOR WHEEZE OR SHORTNESS OF BREATH  Strength: 108 (90 Base) MCG/ACT 12/20/21   Estella Husk, MD   buPROPion [facility] SR) 150 MG 12 hr tablet Take 1 tablet (150 mg total) by mouth 2 (two) times daily. 12/20/21 01/19/22  Estella Husk, MD   doxepin (SINEQUAN) 10 MG capsule Take 1 capsule (10 mg total) by mouth at bedtime as needed. 12/20/21 01/19/22  Estella Husk, MD   gabapentin (NEURONTIN) 600 MG tablet Take 2 tablets (1,200 mg total) by mouth 2 (two) times daily. 12/20/21   Estella Husk, MD   lamoTRIgine (LAMICTAL) 25 MG tablet Take 1 tablet (25 mg total) by mouth daily. 12/21/21 01/20/22  Estella Husk, MD   lidocaine (XYLOCAINE) 2 % solution Use as directed 15 mLs in the mouth or throat as needed for mouth pain. 04/27/22   Fayrene Helper, PA-C   pantoprazole (PROTONIX) 40 MG tablet Take 1 tablet (40 mg total) by mouth daily. 07/17/19 08/03/19  Clapacs, Jackquline Denmark, MD       Critical care time: 32 min         Electronically signed by Tomma Lightning, MD at 01/10/2023  3:46 PM EDT    Associated attestation - Tomma Lightning, MD - 01/10/2023  3:46 PM EDT  Formatting of this note might be different from the original.  Patient seen, independently examined, care  plan was formulated and discussed with Zenia Resides as per documentation    Patient been managed for possible drug overdose leading to encephalopathy  Aspiration pneumonia  Hypoxemic respiratory failure  Background history of polysubstance abuse hepatitis C, IV drug abuse    Did receive Narcan in the field and became very combative  Noted to be hypoxic when he came in for initial evaluation  Intubated in the emergency department    Sedated  S1-S2 appreciated  Coarse breath sounds bilaterally  Bowel sounds appreciated  Extremities warm and dry    Assessment/plan  Metabolic encephalopathy  Pulm polysubstance abuse  Toxic encephalopathy  Acute hypoxemic respiratory failure  Lactic acidosis  Acute kidney injury    Hepatitis C    Continue fluid resuscitation    Trend electrolytes    Antibiotic coverage for possible aspiration pneumonia, notably penicillin allergic  Levaquin started    Continue aggressive care  Risk of decompensation remains very high    The patient is critically ill with multiple organ systems failure and requires high complexity decision making for assessment and support, frequent evaluation and titration of therapies,  application of advanced monitoring technologies and extensive interpretation of multiple databases. Critical Care Time devoted to patient care services described in this note independent of APP/resident time (if applicable)  is 35 minutes.     Virl Diamond MD  [facility]  Personal pager: See [facility]  If unanswered, please page  CCM On-call: #[phone]

## 2023-01-10 NOTE — ED Provider Notes (Signed)
Associated Order(s): .Critical Care; Marland KitchenIntubation  Formatting of this note is different from the original.  Images from the original note were not included.    [facility] EMERGENCY DEPARTMENT AT [facility]  Provider Note    CSN: [id number]  Arrival date & time: 01/10/23  1122        History    No chief complaint on file.    Lucas Hicks is a 42 y.o. male.  He is brought in by mass for altered mental status.  Level 5 caveat.  Patient was found unresponsive in a motel room pinpoint pupils not breathing.  Given Narcan with some improvement in respiratory status but became combative.  Arrives combative jaw clenched not redirectable.  Patient was intubated for airway protection and hypoxia.  Fingerstick blood sugar was 130s.    The history is provided by the EMS personnel.   Altered Mental Status  Presenting symptoms: combativeness and unresponsiveness    Severity:  Severe  Timing:  Constant  Progression:  Unchanged        Home Medications  Prior to Admission medications    Medication Sig Start Date End Date Taking? Authorizing Provider   acetaminophen (TYLENOL) 500 MG tablet Take 1 tablet (500 mg total) by mouth every 6 (six) hours as needed. 04/27/22   Fayrene Helper, PA-C   albuterol (VENTOLIN HFA) 108 (90 Base) MCG/ACT inhaler Inhale 2 puffs into the lungs every 6 (six) hours as needed for wheezing or shortness of breath (shortness of breath or wheezing). INHALE 1-2 PUFFS BY MOUTH EVERY 6 HOURS AS NEEDED FOR WHEEZE OR SHORTNESS OF BREATH  Strength: 108 (90 Base) MCG/ACT 12/20/21   Estella Husk, MD   buPROPion [facility] SR) 150 MG 12 hr tablet Take 1 tablet (150 mg total) by mouth 2 (two) times daily. 12/20/21 01/19/22  Estella Husk, MD   doxepin (SINEQUAN) 10 MG capsule Take 1 capsule (10 mg total) by mouth at bedtime as needed. 12/20/21 01/19/22  Estella Husk, MD   gabapentin (NEURONTIN) 600 MG tablet Take 2 tablets (1,200 mg total) by mouth 2 (two) times daily. 12/20/21   Estella Husk,  MD   lamoTRIgine (LAMICTAL) 25 MG tablet Take 1 tablet (25 mg total) by mouth daily. 12/21/21 01/20/22  Estella Husk, MD   lidocaine (XYLOCAINE) 2 % solution Use as directed 15 mLs in the mouth or throat as needed for mouth pain. 04/27/22   Fayrene Helper, PA-C   pantoprazole (PROTONIX) 40 MG tablet Take 1 tablet (40 mg total) by mouth daily. 07/17/19 08/03/19  Clapacs, Jackquline Denmark, MD       Allergies     Amoxicillin and Prednisone      Review of Systems    Review of Systems   Unable to perform ROS: Mental status change     Physical Exam  Updated Vital Signs  BP (!) 120/107   Pulse 97   Temp 98 F (36.7 C)   Resp (!) 27   Ht 5\' 9"  (1.753 m)   Wt 81.8 kg Comment: estimated in ED  SpO2 99%   BMI 26.63 kg/m   Physical Exam  Vitals and nursing note reviewed.   Constitutional:       General: He is in acute distress.      Appearance: Normal appearance. He is well-developed.   HENT:      Head: Normocephalic and atraumatic.   Eyes:      Conjunctiva/sclera: Conjunctivae normal.  Comments: Pupils were small and equal   Cardiovascular:      Rate and Rhythm: Normal rate and regular rhythm.      Heart sounds: No murmur heard.  Pulmonary:      Effort: Tachypnea present.      Breath sounds: Decreased air movement present.   Abdominal:      Palpations: Abdomen is soft.      Tenderness: There is no abdominal tenderness. There is no guarding or rebound.   Musculoskeletal:         General: No deformity. Normal range of motion.      Cervical back: Neck supple.   Skin:     General: Skin is warm and dry.      Capillary Refill: Capillary refill takes less than 2 seconds.   Neurological:      Comments: Patient was nonverbal.  He was not following commands.  He was intermittently combative and pulling at equipment.     ED Results / Procedures / Treatments    Labs  (all labs ordered are listed, but only abnormal results are displayed)  Labs Reviewed   URINALYSIS, ROUTINE W REFLEX MICROSCOPIC - Abnormal; Notable for the following  components:       Result Value    Color, Urine AMBER (*)     APPearance CLOUDY (*)     Hgb urine dipstick LARGE (*)     Protein, ur 100 (*)     Bacteria, UA RARE (*)     All other components within normal limits   RAPID URINE DRUG SCREEN, HOSP PERFORMED - Abnormal; Notable for the following components:    Cocaine POSITIVE (*)     Benzodiazepines POSITIVE (*)     All other components within normal limits   CBC WITH DIFFERENTIAL/PLATELET - Abnormal; Notable for the following components:    WBC 2.7 (*)     Lymphs Abs 0.6 (*)     All other components within normal limits   PROTIME-INR - Abnormal; Notable for the following components:    Prothrombin Time 16.4 (*)     INR 1.3 (*)     All other components within normal limits   COMPREHENSIVE METABOLIC PANEL - Abnormal; Notable for the following components:    CO2 21 (*)     Glucose, Bld 103 (*)     BUN 37 (*)     Creatinine, Ser 3.86 (*)     Calcium 8.0 (*)     AST 2,316 (*)     ALT 2,154 (*)     GFR, Estimated 19 (*)     Anion gap 20 (*)     All other components within normal limits   LACTIC ACID, PLASMA - Abnormal; Notable for the following components:    Lactic Acid, Venous 5.6 (*)     All other components within normal limits   ACETAMINOPHEN LEVEL - Abnormal; Notable for the following components:    Acetaminophen (Tylenol), Serum <10 (*)     All other components within normal limits   SALICYLATE LEVEL - Abnormal; Notable for the following components:    Salicylate Lvl <7.0 (*)     All other components within normal limits   BRAIN NATRIURETIC PEPTIDE - Abnormal; Notable for the following components:    B Natriuretic Peptide 141.3 (*)     All other components within normal limits   CK - Abnormal; Notable for the following components:    Total CK 2,516 (*)     All other  components within normal limits   GLUCOSE, CAPILLARY - Abnormal; Notable for the following components:    Glucose-Capillary 102 (*)     All other components within normal limits   CBG MONITORING, ED -  Abnormal; Notable for the following components:    Glucose-Capillary 101 (*)     All other components within normal limits   I-STAT CHEM 8, ED - Abnormal; Notable for the following components:    BUN 37 (*)     Creatinine, Ser 4.10 (*)     Calcium, Ion 0.97 (*)     All other components within normal limits   I-STAT ARTERIAL BLOOD GAS, ED - Abnormal; Notable for the following components:    pH, Arterial 7.244 (*)     pCO2 arterial 53.5 (*)     Acid-base deficit 5.0 (*)     Calcium, Ion 1.05 (*)     All other components within normal limits   TROPONIN I (HIGH SENSITIVITY) - Abnormal; Notable for the following components:    Troponin I (High Sensitivity) 188 (*)     All other components within normal limits   CULTURE, BLOOD (ROUTINE X 2)   CULTURE, BLOOD (ROUTINE X 2)   CULTURE, BLOOD (ROUTINE X 2)   CULTURE, RESPIRATORY W GRAM STAIN   LIPASE, BLOOD   ETHANOL   LACTIC ACID, PLASMA   HIV ANTIBODY (ROUTINE TESTING W REFLEX)   PROCALCITONIN   STREP PNEUMONIAE URINARY ANTIGEN   BASIC METABOLIC PANEL   CALCIUM, IONIZED   CBC   BASIC METABOLIC PANEL   BLOOD GAS, ARTERIAL   MAGNESIUM   PHOSPHORUS   I-STAT VENOUS BLOOD GAS, ED   TYPE AND SCREEN   TROPONIN I (HIGH SENSITIVITY)     EKG  EKG Interpretation    Date/Time:  Friday January 10 2023 11:25:20 EDT  Ventricular Rate:  91  PR Interval:  139  QRS Duration: 90  QT Interval:  378  QTC Calculation: 466  R Axis:   102  Text Interpretation: Sinus rhythm Right axis deviation rate is faster than prior 3/23 Confirmed by Meridee Score [phone]) on 01/10/2023 11:42:04 AM    Radiology  CT CHEST ABDOMEN PELVIS WO CONTRAST    Result Date: 01/10/2023  CLINICAL DATA:  Sepsis, OD/sepsis EXAM: CT CHEST, ABDOMEN AND PELVIS WITHOUT CONTRAST TECHNIQUE: Multidetector CT imaging of the chest, abdomen and pelvis was performed following the standard protocol without IV contrast. RADIATION DOSE REDUCTION: This exam was performed according to the departmental dose-optimization program which includes  automated exposure control, adjustment of the mA and/or kV according to patient size and/or use of iterative reconstruction technique. COMPARISON:  Chest radiograph performed earlier on the same date. FINDINGS: CT CHEST FINDINGS Cardiovascular: No significant vascular findings. Normal heart size. No pericardial effusion. Mediastinum/Nodes: No enlarged mediastinal, hilar, or axillary lymph nodes. Thyroid gland, trachea, and esophagus demonstrate no significant findings. Endotracheal tube and feeding tube coursing below the diaphragm in the body of the stomach. Lungs/Pleura: Bilateral multifocal lung opacities prominent in the dependent portion of the bilateral lower lobes with air bronchograms consistent with multifocal pneumonia. No pleural effusion or pneumothorax. Musculoskeletal: No chest wall mass or suspicious bone lesions identified. CT ABDOMEN PELVIS FINDINGS Hepatobiliary: No focal liver abnormality is seen. No gallstones, gallbladder wall thickening, or biliary dilatation. Pancreas: Unremarkable. No pancreatic ductal dilatation or surrounding inflammatory changes. Spleen: Normal in size without focal abnormality. Adrenals/Urinary Tract: Adrenal glands are unremarkable. Punctate nonobstructing calculus in the upper pole of the left kidney. There  is also 4 mm nonobstructing calculus in the interpolar region of the right kidney. No hydronephrosis or ureteral calculus. Foley's catheter in place, urinary bladder is otherwise unremarkable. Stomach/Bowel: Stomach is within normal limits. NG tube with distal tip terminating the body of the stomach. Small bowel loops are normal in caliber. Appendix not identified, however no inflammatory changes in the pericecal region. No evidence of bowel wall thickening, distention, or inflammatory changes. Vascular/Lymphatic: No significant vascular findings are present. No enlarged abdominal or pelvic lymph nodes. Reproductive: Prostate is unremarkable. Other: No abdominal wall  hernia or abnormality. No abdominopelvic ascites. Musculoskeletal: No acute or significant osseous findings. IMPRESSION: 1. Bilateral multifocal lung opacities prominent in the dependent portion of the bilateral lower lobes with air bronchograms consistent with multifocal pneumonia. Follow-up examination to resolution is recommended. 2. Endotracheal tube, feeding tube and Foley's catheter are in appropriate position. 3. No evidence of bowel obstruction. No evidence of colitis or diverticulitis. 4. Bilateral nonobstructing renal calculi. No hydronephrosis or ureteral calculus. Electronically Signed   By: Larose Hires D.O.   On: 01/10/2023 15:16     CT Head Wo Contrast    Result Date: 01/10/2023  CLINICAL DATA:  Provided history: Mental status change, unknown cause. EXAM: CT HEAD WITHOUT CONTRAST TECHNIQUE: Contiguous axial images were obtained from the base of the skull through the vertex without intravenous contrast. RADIATION DOSE REDUCTION: This exam was performed according to the departmental dose-optimization program which includes automated exposure control, adjustment of the mA and/or kV according to patient size and/or use of iterative reconstruction technique. COMPARISON:  No pertinent prior exams available for comparison. FINDINGS: Brain: No age advanced or lobar predominant parenchymal atrophy. 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 There is no acute intracranial hemorrhage. No demarcated cortical infarct. No extra-axial fluid collection. No evidence of an intracranial mass. No midline shift. Vascular: No hyperdense vessel. Atherosclerotic calcifications. Skull: No fracture or aggressive osseous lesion. Sinuses/Orbits: No mass or acute finding within the imaged orbits. No significant paranasal sinus disease. Other: Partially imaged life-support tubes. Secretions within the nasopharynx. IMPRESSION: 1.  No evidence of an acute intracranial abnormality. 2. Chronic right basal ganglia lacunar  infarct. Electronically Signed   By: Jackey Loge D.O.   On: 01/10/2023 14:59     DG Chest Port 1 View    Result Date: 01/10/2023  CLINICAL DATA:  Intubation EXAM: PORTABLE CHEST 1 VIEW COMPARISON:  Chest radiograph 07/02/2021 FINDINGS: Endotracheal tube terminates approximately 4 cm above the carina. Enteric tube courses below diaphragm with the tip projecting over the location of the stomach and the side hole at the level of the GE junction. No pleural effusion. No pneumothorax. Normal cardiac and mediastinal contours. There are prominent bilateral perihilar airspace opacities, which could represent pulmonary edema or atypical infection. No radiographically apparent displaced rib fractures. IMPRESSION: 1. Prominent bilateral perihilar airspace opacities, which could represent pulmonary edema or atypical infection. If the patient is immunocompromised, fungal infection is also a differential consideration. 2. Endotracheal tube terminates approximately 4 cm above the carina. 3. Enteric tube side hole at the level of the GE junction, recommend advancement by 5 cm. Electronically Signed   By: Lorenza Cambridge M.D.   On: 01/10/2023 12:04      Procedures  .Critical Care    Performed by: Terrilee Files, MD  Authorized by: Terrilee Files, MD    Critical care provider statement:     Critical care time (minutes):  45    Critical care time  was exclusive of:  Separately billable procedures and treating other patients    Critical care was necessary to treat or prevent imminent or life-threatening deterioration of the following conditions:  CNS failure or compromise    Critical care was time spent personally by me on the following activities:  Development of treatment plan with patient or surrogate, discussions with consultants, evaluation of patient's response to treatment, examination of patient, obtaining history from patient or surrogate, ordering and performing treatments and interventions, ordering and review of  laboratory studies, ordering and review of radiographic studies, pulse oximetry, re-evaluation of patient's condition and review of old charts    I assumed direction of critical care for this patient from another provider in my specialty: no    Procedure Name: Intubation  Date/Time: 01/10/2023 5:22 PM    Performed by: Terrilee Files, MDPre-anesthesia Checklist: Patient identified, Patient being monitored, Emergency Drugs available, Timeout performed and Suction available  Oxygen Delivery Method: Non-rebreather mask  Preoxygenation: Pre-oxygenation with 100% oxygen  Induction Type: Rapid sequence  Ventilation: Mask ventilation without difficulty  Laryngoscope Size: Glidescope and 3  Grade View: Grade II  Number of attempts: 1  Placement Confirmation: ETT inserted through vocal cords under direct vision, CO2 detector and Breath sounds checked- equal and bilateral  Secured at: 25 cm  Tube secured with: ETT holder  Dental Injury: Teeth and Oropharynx as per pre-operative assessment   Difficulty Due To: Difficulty was anticipated        Medications Ordered in ED  Medications   propofol (DIPRIVAN) 1000 MG/100ML infusion (25 mcg/kg/min  81.8 kg Intravenous Infusion Verify 01/10/23 1337)   fentaNYL in NS (38mcg/ml) infusion-PREMIX (125 mcg/hr Intravenous Infusion Verify 01/10/23 1315)   fentaNYL (SUBLIMAZE) bolus via infusion 50-100 mcg (has no administration in time range)   0.9 %  sodium chloride infusion (250 mLs Intravenous New Bag/Given 01/10/23 1557)   norepinephrine (LEVOPHED) 4mg  in (0.016 mg/mL) premix infusion (14 mcg/min Intravenous Infusion Verify 01/10/23 1336)   polyethylene glycol (MIRALAX / GLYCOLAX) packet 17 g (has no administration in time range)   lactated ringers infusion ( Intravenous New Bag/Given 01/10/23 1600)   Oral care mouth rinse (15 mLs Mouth Rinse Given 01/10/23 1500)   Oral care mouth rinse (has no administration in time range)   docusate (COLACE) 50 MG/5ML liquid 100 mg (has no  administration in time range)   levofloxacin (LEVAQUIN) IVPB 750 mg (750 mg Intravenous New Bag/Given 01/10/23 1514)   famotidine (PEPCID) 10 mg in sodium chloride 0.9 % 25 mL (has no administration in time range)   sodium chloride 0.9 % bolus 500 mL (0 mLs Intravenous Stopped 01/10/23 1228)   fentaNYL (SUBLIMAZE) injection 50 mcg (50 mcg Intravenous Given 01/10/23 1140)   sodium chloride 0.9 % bolus 1,000 mL (1,000 mLs Intravenous New Bag/Given 01/10/23 1302)   etomidate (AMIDATE) injection (20 mg Intravenous Given 01/10/23 1135)   succinylcholine (ANECTINE) injection (140 mg Intravenous Given 01/10/23 1135)   calcium gluconate 1 g/ 50 mL sodium chloride IVPB (1,000 mg Intravenous New Bag/Given 01/10/23 1545)   lactated ringers bolus 500 mL (0 mLs Intravenous Stopped 01/10/23 1558)     ED Course/ Medical Decision Making/ A&P  Clinical Course as of 01/10/23 1722   Fri Jan 10, 2023   1153 Chest x-ray looking like multifocal pneumonia patient had some thick secretions when tracheally suctioned.  Allergy to amoxicillin, hives, ordered clindamycin. [MB]   1214 Discussed with critical care NP Tanja Port who will be  down to see the patient in consult [MB]   1239 Patient's blood pressure is running soft although he is on a significant amount of sedation as he is agitated if he lightens up.  Will continue to monitor [MB]     Clinical Course User Index  [MB] Terrilee Files, MD         Medical Decision Making  Amount and/or Complexity of Data Reviewed  Labs: ordered.  Radiology: ordered.    Risk  Prescription drug management.  Decision regarding hospitalization.    This patient complains of altered mental status combativeness; this involves an extensive number of treatment  Options and is a complaint that carries with it a high risk of complications and  morbidity. The differential includes overdose, withdrawal, stroke, bleed, metabolic derangement    I ordered, reviewed and interpreted labs, which included CBC with low white count stable  hemoglobin, chemistries with elevated BUN and creatinine elevated LFTs, lactate elevated, blood culture sent, CPK elevated, troponins elevated, talk screen positive for cocaine cocaine benzodiazepines  I ordered medication medications for intubation and sedation, IV fluids IV antibiotics and reviewed PMP when indicated.  I ordered imaging studies which included chest x-ray, CT head chest abdomen and pelvis and I independently     visualized and interpreted imaging which showed multifocal infiltrates  Additional history obtained from EMS  Previous records obtained and reviewed in epic prior visits for substance abuse  I consulted critical care NP Babcock and discussed lab and imaging findings and discussed disposition.   Cardiac monitoring reviewed, normal sinus rhythm  Social determinants considered, unable to obtain  Critical Interventions: Intubation and management of patient's altered mental status with IV fluids IV antibiotics sedation    After the interventions stated above, I reevaluated the patient and found patient still to be critically ill although airway protected.  Blood pressure soft.  Admission and further testing considered, patient will benefit from mission the hospital for further management.  Appreciate critical care evaluating and managing patient.    Final Clinical Impression(s) / ED Diagnoses  Final diagnoses:   Altered mental status, unspecified altered mental status type   AKI (acute kidney injury)   Elevated CPK   Hypotension, unspecified hypotension type   Multifocal pneumonia     Rx / DC Orders  ED Discharge Orders       None           Terrilee Files, MD  01/10/23 1726    Electronically signed by Terrilee Files, MD at 01/10/2023  5:26 PM EDT

## 2023-01-11 ENCOUNTER — Inpatient Hospital Stay (HOSPITAL_COMMUNITY): Payer: Medicaid Other

## 2023-01-11 DIAGNOSIS — G929 Unspecified toxic encephalopathy: Secondary | ICD-10-CM | POA: Diagnosis not present

## 2023-01-11 LAB — POCT I-STAT 7, (LYTES, BLD GAS, ICA,H+H)
Acid-Base Excess: 3 mmol/L — ABNORMAL HIGH (ref 0.0–2.0)
Acid-base deficit: 3 mmol/L — ABNORMAL HIGH (ref 0.0–2.0)
Bicarbonate: 22.1 mmol/L (ref 20.0–28.0)
Bicarbonate: 28.1 mmol/L — ABNORMAL HIGH (ref 20.0–28.0)
Calcium, Ion: 1.15 mmol/L (ref 1.15–1.40)
Calcium, Ion: 1.24 mmol/L (ref 1.15–1.40)
HCT: 37 % — ABNORMAL LOW (ref 39.0–52.0)
HCT: 34 % — ABNORMAL LOW (ref 39.0–52.0)
Hemoglobin: 12.6 g/dL — ABNORMAL LOW (ref 13.0–17.0)
Hemoglobin: 11.6 g/dL — ABNORMAL LOW (ref 13.0–17.0)
O2 Saturation: 96 %
O2 Saturation: 92 %
Patient temperature: 37
Patient temperature: 98.6
Potassium: 4.5 mmol/L (ref 3.5–5.1)
Potassium: 4.1 mmol/L (ref 3.5–5.1)
Sodium: 137 mmol/L (ref 135–145)
Sodium: 140 mmol/L (ref 135–145)
TCO2: 23 mmol/L (ref 22–32)
TCO2: 29 mmol/L (ref 22–32)
pCO2 arterial: 40.5 mmHg (ref 32–48)
pCO2 arterial: 41.7 mmHg (ref 32–48)
pH, Arterial: 7.345 — ABNORMAL LOW (ref 7.35–7.45)
pH, Arterial: 7.436 (ref 7.35–7.45)
pO2, Arterial: 84 mmHg (ref 83–108)
pO2, Arterial: 61 mmHg — ABNORMAL LOW (ref 83–108)

## 2023-01-11 LAB — BASIC METABOLIC PANEL
Anion gap: 11 (ref 5–15)
BUN: 35 mg/dL — ABNORMAL HIGH (ref 6–20)
CO2: 21 mmol/L — ABNORMAL LOW (ref 22–32)
Calcium: 7.6 mg/dL — ABNORMAL LOW (ref 8.9–10.3)
Chloride: 102 mmol/L (ref 98–111)
Creatinine, Ser: 2.2 mg/dL — ABNORMAL HIGH (ref 0.61–1.24)
GFR, Estimated: 37 mL/min — ABNORMAL LOW (ref >60)
Glucose, Bld: 104 mg/dL — ABNORMAL HIGH (ref 70–99)
Potassium: 4.8 mmol/L (ref 3.5–5.1)
Sodium: 134 mmol/L — ABNORMAL LOW (ref 135–145)

## 2023-01-11 LAB — CBC
HCT: 38.4 % — ABNORMAL LOW (ref 39.0–52.0)
Hemoglobin: 13 g/dL (ref 13.0–17.0)
MCH: 30.1 pg (ref 26.0–34.0)
MCHC: 33.9 g/dL (ref 30.0–36.0)
MCV: 88.9 fL (ref 80.0–100.0)
Platelets: 187 10*3/uL (ref 150–400)
RBC: 4.32 MIL/uL (ref 4.22–5.81)
RDW: 14.6 % (ref 11.5–15.5)
WBC: 9.2 10*3/uL (ref 4.0–10.5)
nRBC: 0 % (ref 0.0–0.2)

## 2023-01-11 LAB — MAGNESIUM: Magnesium: 1.7 mg/dL (ref 1.7–2.4)

## 2023-01-11 LAB — GLUCOSE, CAPILLARY
Glucose-Capillary: 101 mg/dL — ABNORMAL HIGH (ref 70–99)
Glucose-Capillary: 83 mg/dL (ref 70–99)
Glucose-Capillary: 77 mg/dL (ref 70–99)
Glucose-Capillary: 78 mg/dL (ref 70–99)
Glucose-Capillary: 78 mg/dL (ref 70–99)
Glucose-Capillary: 77 mg/dL (ref 70–99)

## 2023-01-11 LAB — MRSA NEXT GEN BY PCR, NASAL: MRSA by PCR Next Gen: NOT DETECTED

## 2023-01-11 LAB — CULTURE, BLOOD (ROUTINE X 2)
Culture: NO GROWTH
Culture: NO GROWTH

## 2023-01-11 LAB — STREP PNEUMONIAE URINARY ANTIGEN: Strep Pneumo Urinary Antigen: POSITIVE — AB

## 2023-01-11 LAB — PHOSPHORUS: Phosphorus: 4.7 mg/dL — ABNORMAL HIGH (ref 2.5–4.6)

## 2023-01-11 MED ORDER — SODIUM CHLORIDE 0.9% FLUSH
10.0000 mL | Freq: Two times a day (BID) | INTRAVENOUS | Status: DC
  Administered 2023-01-11 – 2023-01-15 (×9): 10 mL

## 2023-01-11 MED ORDER — HALOPERIDOL LACTATE 5 MG/ML IJ SOLN
2.0000 mg | Freq: Once | INTRAMUSCULAR | Status: AC
  Administered 2023-01-11: 2 mg via INTRAVENOUS
  Filled 2023-01-11: qty 1

## 2023-01-11 MED ORDER — HEPARIN SODIUM (PORCINE) 5000 UNIT/ML IJ SOLN
5000.0000 [IU] | Freq: Three times a day (TID) | INTRAMUSCULAR | Status: DC
  Administered 2023-01-11 – 2023-01-18 (×21): 5000 [IU] via SUBCUTANEOUS
  Filled 2023-01-11 (×21): qty 1

## 2023-01-11 MED ORDER — DEXMEDETOMIDINE HCL IN NACL 400 MCG/100ML IV SOLN
0.0000 ug/kg/h | INTRAVENOUS | Status: DC
  Administered 2023-01-11: 0.4 ug/kg/h via INTRAVENOUS
  Administered 2023-01-12: 1.2 ug/kg/h via INTRAVENOUS
  Administered 2023-01-12 (×2): 0.8 ug/kg/h via INTRAVENOUS
  Administered 2023-01-12: 1 ug/kg/h via INTRAVENOUS
  Administered 2023-01-13 (×2): 0.8 ug/kg/h via INTRAVENOUS
  Filled 2023-01-11 (×6): qty 100

## 2023-01-11 MED ORDER — FAMOTIDINE 20 MG PO TABS
20.0000 mg | ORAL_TABLET | Freq: Every day | ORAL | Status: DC
  Administered 2023-01-11: 20 mg
  Filled 2023-01-11: qty 1

## 2023-01-11 MED ORDER — MAGNESIUM SULFATE 2 GM/50ML IV SOLN
2.0000 g | Freq: Once | INTRAVENOUS | Status: AC
  Administered 2023-01-11: 2 g via INTRAVENOUS
  Filled 2023-01-11: qty 50

## 2023-01-11 MED ORDER — CHLORHEXIDINE GLUCONATE CLOTH 2 % EX PADS
6.0000 | MEDICATED_PAD | Freq: Every day | CUTANEOUS | Status: DC
  Administered 2023-01-12 – 2023-01-17 (×6): 6 via TOPICAL

## 2023-01-11 MED ORDER — MAGNESIUM SULFATE 4 GM/100ML IV SOLN
4.0000 g | Freq: Once | INTRAVENOUS | Status: AC
  Administered 2023-01-11: 4 g via INTRAVENOUS
  Filled 2023-01-11: qty 100

## 2023-01-11 MED ORDER — SODIUM CHLORIDE 0.9% FLUSH: 10.0000 mL | INTRAVENOUS | Status: DC | PRN

## 2023-01-11 MED ORDER — DEXMEDETOMIDINE HCL IN NACL 400 MCG/100ML IV SOLN
INTRAVENOUS | Status: AC
  Filled 2023-01-11: qty 100

## 2023-01-11 NOTE — Progress Notes (Signed)
NAME:  Randall Parsons, MRN:  [id number], DOB:  January 30, 1981, LOS: 1 ADMISSION DATE:  01/10/2023, CONSULTATION DATE:  4/5 REFERRING MD:  Charm Barges, CHIEF COMPLAINT:  presumed drug overdose    History of Present Illness:  This is a 42 year old male patient with history as outlined below.  He was found at a hotel on 4/5 by staff unresponsive, apneic with pinpoint pupils.  He was given Narcan in the field, after this became combative and staff was unable to redirect him.  On arrival to the ER he remained combative, his jaw was clenched, he was hypoxic, and ultimately intubated for airway protection. Initial blood glucose 101 Portable chest x-ray shows bilateral patchy airspace disease with endotracheal tube in satisfactory position Critical care asked to admit with working diagnosis of presumed drug overdose  Pertinent  Medical History  Polysubstance abuse, Hep C, IVDA, depression, ETOH, substance induced mood disorder   Significant Hospital Events: Including procedures, antibiotic start and stop dates in addition to other pertinent events   4/5 admitted after being found unresponsive hypoxic and agitated after Narcan. Intubated for airway protection. Started on Levaquin   Interim History / Subjective:   No acute events overnight Weaned off levophed overnight Sedation weaned this morning with increased agitation but was following commands with strong respirations so he was urgently extubated  Objective   Blood pressure 123/67, pulse 78, temperature (!) 97.3 F (36.3 C), resp. rate 14, height 5\' 9"  (1.753 m), weight 81.8 kg, SpO2 100 %.    Vent Mode: PSV;CPAP FiO2 (%):  [40 %-100 %] 40 % Set Rate:  [20 bmp] 20 bmp Vt Set:  [560 mL] 560 mL PEEP:  [5 cmH20-12 cmH20] 5 cmH20 Pressure Support:  [10 cmH20] 10 cmH20 Plateau Pressure:  [22 cmH20-24 cmH20] 24 cmH20   Intake/Output Summary (Last 24 hours) at 01/11/2023 1255 Last data filed at 01/11/2023 0600 Gross per 24 hour  Intake 2834.37 ml  Output  680 ml  Net 2154.37 ml   Filed Weights   01/10/23 1100  Weight: 81.8 kg    Examination: done when intubated General: no acute distress, intubated, sedated HENT: ET tube in place, moist mucous membranes, sclera anicteric Lungs: coarse bilateral breath sounds  Cardiovascular: RRR Abdomen: soft, BS+ Extremities: warm good pulses no edema Neuro: PERRL, sedated GU: foley, cl yellow   Resolved Hospital Problem list     Assessment & Plan:  Acute toxic encephalopathy presumed secondary to overdose superimposed known h/o polysubstance abuse, depression and IVDA Plan - Imrpoved - patient denies suicidal/homicidal intent  Acute hypoxic respiratory failure secondary to aspiration pneumonia and ineffective airway protection Plan Extubated today F/u Respiratory culture  Continue Levaquin (allergic to PCN)  Acute Kidney Injury Mild Rhabdomyolysis - received aggressive fluid resuscitation on admission - Continue LR at 149mL/hr - Cr improving, good UOP  Elevated Troponin - stress induced from sepsis, hypoxia - no ishcemic EKG changes  Drug related hypotension Developed progressive hypotension after starting prop Plan Resolved, Off sedation and levophed  Lactic acidosis  Plan Iv hydration/fluid bolus   Hep C Plan Contact precautions  Best Practice (right click and "Reselect all SmartList Selections" daily)   Diet/type: NPO DVT prophylaxis: SCD GI prophylaxis: H2B Lines: N/A Foley:  Yes, and it is still needed Code Status:  full code Last date of multidisciplinary goals of care discussion [pending ]  Labs   CBC: Recent Labs  Lab 01/10/23 1138 01/10/23 1302 01/10/23 1348 01/11/23 0138 01/11/23 0358  WBC 2.7*  --   --  9.2  --   NEUTROABS 1.9  --   --   --   --   HGB 14.7 13.6 15.6 13.0 12.6*  HCT 46.6 40.0 46.0 38.4* 37.0*  MCV 93.8  --   --  88.9  --   PLT 277  --   --  187  --     Basic Metabolic Panel: Recent Labs  Lab 01/10/23 1138 01/10/23 1302  01/10/23 1348 01/10/23 1709 01/11/23 0138 01/11/23 0358  NA 140 137 139 139 134* 137  K 4.1 4.1 4.1 4.2 4.8 4.5  CL 99  --  101 100 102  --   CO2 21*  --   --  23 21*  --   GLUCOSE 103*  --  90 109* 104*  --   BUN 37*  --  37* 34* 35*  --   CREATININE 3.86*  --  4.10* 3.08* 2.20*  --   CALCIUM 8.0*  --   --  7.4* 7.6*  --   MG  --   --   --   --  1.7  --   PHOS  --   --   --   --  4.7*  --    GFR: Estimated Creatinine Clearance: 43.3 mL/min (A) (by C-G formula based on SCr of 2.2 mg/dL (H)). Recent Labs  Lab 01/10/23 1138 01/10/23 1140 01/10/23 1709 01/10/23 1711 01/11/23 0138  PROCALCITON  --   --  20.10  --   --   WBC 2.7*  --   --   --  9.2  LATICACIDVEN  --  5.6*  --  4.5*  --     Liver Function Tests: Recent Labs  Lab 01/10/23 1138  AST 2,316*  ALT 2,154*  ALKPHOS 73  BILITOT 0.7  PROT 6.6  ALBUMIN 3.6   Recent Labs  Lab 01/10/23 1138  LIPASE 27   No results for input(s): "AMMONIA" in the last 168 hours.  ABG    Component Value Date/Time   PHART 7.345 (L) 01/11/2023 0358   PCO2ART 40.5 01/11/2023 0358   PO2ART 84 01/11/2023 0358   HCO3 22.1 01/11/2023 0358   TCO2 23 01/11/2023 0358   ACIDBASEDEF 3.0 (H) 01/11/2023 0358   O2SAT 96 01/11/2023 0358     Coagulation Profile: Recent Labs  Lab 01/10/23 1138  INR 1.3*    Cardiac Enzymes: Recent Labs  Lab 01/10/23 1138  CKTOTAL 2,516*    HbA1C: Hgb A1c MFr Bld  Date/Time Value Ref Range Status  12/17/2021 10:59 PM 5.9 (H) 4.8 - 5.6 % Final    Comment:    (NOTE)         Prediabetes: 5.7 - 6.4         Diabetes: >6.4         Glycemic control for adults with diabetes: <7.0   12/03/2020 04:54 PM 5.8 (H) 4.8 - 5.6 % Final    Comment:    (NOTE) Pre diabetes:          5.7%-6.4%  Diabetes:              >6.4%  Glycemic control for   <7.0% adults with diabetes     CBG: Recent Labs  Lab 01/10/23 1952 01/10/23 2312 01/11/23 0359 01/11/23 0855 01/11/23 1103  GLUCAP 93 110* 101* 83  77       Critical care time: 40 min     Melody Comas, MD [facility] Office: [phone]  See Amion for personal pager PCCM on call pager [phone] until 7pm. Please call Elink 7p-7a. [phone]

## 2023-01-11 NOTE — Progress Notes (Signed)
Peripherally Inserted Central Catheter Placement  The IV Nurse has discussed with the patient and/or persons authorized to consent for the patient, the purpose of this procedure and the potential benefits and risks involved with this procedure.  The benefits include less needle sticks, lab draws from the catheter, and the patient may be discharged home with the catheter. Risks include, but not limited to, infection, bleeding, blood clot (thrombus formation), and puncture of an artery; nerve damage and irregular heartbeat and possibility to perform a PICC exchange if needed/ordered by physician.  Alternatives to this procedure were also discussed.  Bard Power PICC patient education guide, fact sheet on infection prevention and patient information card has been provided to patient /or left at bedside.  Consent obtained from wife at bedside.  PICC Placement Documentation  PICC Triple Lumen 01/11/23 Right Basilic 39 cm 0 cm (Active)  Indication for Insertion or Continuance of Line Vasoactive infusions 01/11/23 0820  Exposed Catheter (cm) 0 cm 01/11/23 0820  Site Assessment Clean, Dry, Intact 01/11/23 0820  Lumen #1 Status Saline locked;Flushed;Blood return noted 01/11/23 0820  Lumen #2 Status Saline locked;Flushed;Blood return noted 01/11/23 0820  Lumen #3 Status Saline locked;Flushed;Blood return noted 01/11/23 0820  Dressing Type Transparent;Securing device 01/11/23 0820  Dressing Status Antimicrobial disc in place;Clean, Dry, Intact 01/11/23 0820  Safety Lock Not Applicable 01/11/23 0820  Line Care Connections checked and tightened 01/11/23 0820  Line Adjustment (NICU/IV Team Only) No 01/11/23 0820  Dressing Intervention New dressing 01/11/23 0820  Dressing Change Due 01/18/23 01/11/23 0820       Burnard Bunting Chenice 01/11/2023, 8:21 AM

## 2023-01-11 NOTE — Procedures (Signed)
Extubation Procedure Note  Patient Details:   Name: Randall Parsons DOB: 07-18-1981 MRN: [id number]   Airway Documentation:    Vent end date: 01/11/23 Vent end time: 1140   Evaluation  O2 sats: stable throughout Complications: No apparent complications Patient did tolerate procedure well. Bilateral Breath Sounds: Clear, Diminished   Yes  Pt extubated to HFNC 10L. Unable to check for cuff leak due to emergent extubation, due to pt aggravation and trying to self extubate. No stridor noted, MD aware, RTx 2 at bedside,RT will monitor,   Thornell Mule 01/11/2023, 11:46 AM

## 2023-01-11 NOTE — Progress Notes (Addendum)
eLink Physician-Brief Progress Note Patient Name: Remiel Tinkle DOB: [birth year] MRN: [id number]   Date of Service  01/11/2023  HPI/Events of Note  42 year old was initially admitted with suspected drug overdose and was intubated.  Was extubated earlier today given significant agitation on proper respiratory status on spontaneous awakening trial.  Urgently called to the room due to severe agitation and progressive hypoxemia down to mid 80s.  On examination, the patient is super agitated, on a nonrebreather saturating in the mid to high 80s.  eICU Interventions  Chest radiograph is ordered as stat.  High flow nasal cannula is ordered.  For agitation, will initiate Precedex infusion, Haldol 2 mg IV once.  QTc within normal limits.  Patient has appropriate IV access for now, he has significant tachypnea.   Plan for ABG 20-30 minutes after high flow nasal cannula and Precedex   2135 - Has not urinated since foley removed today. Bladder scan showed 800cc.; In and out cath PRN for 3 attempts, replace foley if persistent retention   Intervention Category Minor Interventions: Agitation / anxiety - evaluation and management  Linder Prajapati 01/11/2023, 8:45 PM

## 2023-01-11 NOTE — Procedures (Signed)
Formatting of this note might be different from the original.  Extubation Procedure Note    Patient Details:    Name: Darrol Brandenburg  DOB: [birth year]  MRN: [id number]    Airway Documentation:     Vent end date: 01/11/23 Vent end time: 1140     Evaluation   O2 sats: stable throughout  Complications: No apparent complications  Patient did tolerate procedure well.  Bilateral Breath Sounds: Clear, Diminished    Yes    Pt extubated to HFNC 10L. Unable to check for cuff leak due to emergent extubation, due to pt aggravation and trying to self extubate. No stridor noted, MD aware, RTx 2 at bedside,RT will monitor,     Thornell Mule  01/11/2023, 11:46 AM    Electronically signed by Thornell Mule, RRT at 01/11/2023 11:49 AM EDT

## 2023-01-11 NOTE — Progress Notes (Signed)
Formatting of this note is different from the original.  Images from the original note were not included.      NAME:  Lucas Hicks, MRN:  [id number], DOB:  Aug 28, 1981, LOS: 1  ADMISSION DATE:  01/10/2023, CONSULTATION DATE:  4/5  REFERRING MD:  Charm Barges, CHIEF COMPLAINT:  presumed drug overdose      History of Present Illness:   This is a 42 year old male patient with history as outlined below.  He was found at a hotel on [place] by staff unresponsive, apneic with pinpoint pupils.  He was given Narcan in the field, after this became combative and staff was unable to redirect him.  On arrival to the ER he remained combative, his jaw was clenched, he was hypoxic, and ultimately intubated for airway protection.  Initial blood glucose 101  Portable chest x-ray shows bilateral patchy airspace disease with endotracheal tube in satisfactory position  Critical care asked to admit with working diagnosis of presumed drug overdose    Pertinent  Medical History   Polysubstance abuse, Hep C, IVDA, depression, ETOH, substance induced mood disorder     Significant Hospital Events:  Including procedures, antibiotic start and stop dates in addition to other pertinent events    4/5 admitted after being found unresponsive hypoxic and agitated after Narcan. Intubated for airway protection. Started on Levaquin     Interim History / Subjective:     No acute events overnight  Weaned off levophed overnight  Sedation weaned this morning with increased agitation but was following commands with strong respirations so he was urgently extubated    Objective    Blood pressure 123/67, pulse 78, temperature (!) 97.3 F (36.3 C), resp. rate 14, height 5\' 9"  (1.753 m), weight 81.8 kg, SpO2 100 %.    Vent Mode: PSV;CPAP  FiO2 (%):  [40 %-100 %] 40 %  Set Rate:  [20 bmp] 20 bmp  Vt Set:  [560 mL] 560 mL  PEEP:  [5 cmH20-12 cmH20] 5 cmH20  Pressure Support:  [10 cmH20] 10 cmH20  Plateau Pressure:  [22 cmH20-24 cmH20] 24 cmH20     Intake/Output Summary (Last 24  hours) at 01/11/2023 1255  Last data filed at 01/11/2023 0600  Gross per 24 hour   Intake 2834.37 ml   Output 680 ml   Net 2154.37 ml     Filed Weights    01/10/23 1100   Weight: 81.8 kg     Examination: done when intubated  General: no acute distress, intubated, sedated  HENT: ET tube in place, moist mucous membranes, sclera anicteric  Lungs: coarse bilateral breath sounds   Cardiovascular: RRR  Abdomen: soft, BS+  Extremities: warm good pulses no edema  Neuro: PERRL, sedated  GU: foley, cl yellow     Resolved Hospital Problem list      Assessment & Plan:   Acute toxic encephalopathy presumed secondary to overdose superimposed known h/o polysubstance abuse, depression and IVDA  Plan  - Imrpoved  - patient denies suicidal/homicidal intent    Acute hypoxic respiratory failure secondary to aspiration pneumonia and ineffective airway protection  Plan  Extubated today  F/u Respiratory culture   Continue Levaquin (allergic to PCN)    Acute Kidney Injury  Mild Rhabdomyolysis  - received aggressive fluid resuscitation on admission  - Continue LR at 182mL/hr  - Cr improving, good UOP    Elevated Troponin  - stress induced from sepsis, hypoxia  - no ishcemic EKG changes    Drug related hypotension  Developed  progressive hypotension after starting prop  Plan  Resolved, Off sedation and levophed    Lactic acidosis   Plan  Iv hydration/fluid bolus     Hep C  Plan  Contact precautions    Best Practice (right click and "Reselect all SmartList Selections" daily)     Diet/type: NPO  DVT prophylaxis: SCD  GI prophylaxis: H2B  Lines: N/A  Foley:  Yes, and it is still needed  Code Status:  full code  Last date of multidisciplinary goals of care discussion [pending ]    Labs    CBC:  Recent Labs   Lab 01/10/23  1138 01/10/23  1302 01/10/23  1348 01/11/23  0138 01/11/23  0358   WBC 2.7*  --   --  9.2  --    NEUTROABS 1.9  --   --   --   --    HGB 14.7 13.6 15.6 13.0 12.6*   HCT 46.6 40.0 46.0 38.4* 37.0*   MCV 93.8  --   --  88.9  --    PLT  277  --   --  187  --      Basic Metabolic Panel:  Recent Labs   Lab 01/10/23  1138 01/10/23  1302 01/10/23  1348 01/10/23  1709 01/11/23  0138 01/11/23  0358   NA 140 137 139 139 134* 137   K 4.1 4.1 4.1 4.2 4.8 4.5   CL 99  --  101 100 102  --    CO2 21*  --   --  23 21*  --    GLUCOSE 103*  --  90 109* 104*  --    BUN 37*  --  37* 34* 35*  --    CREATININE 3.86*  --  4.10* 3.08* 2.20*  --    CALCIUM 8.0*  --   --  7.4* 7.6*  --    MG  --   --   --   --  1.7  --    PHOS  --   --   --   --  4.7*  --      GFR:  Estimated Creatinine Clearance: 43.3 mL/min (A) (by C-G formula based on SCr of 2.2 mg/dL (H)).  Recent Labs   Lab 01/10/23  1138 01/10/23  1140 01/10/23  1709 01/10/23  1711 01/11/23  0138   PROCALCITON  --   --  20.10  --   --    WBC 2.7*  --   --   --  9.2   LATICACIDVEN  --  5.6*  --  4.5*  --      Liver Function Tests:  Recent Labs   Lab 01/10/23  1138   AST 2,316*   ALT 2,154*   ALKPHOS 73   BILITOT 0.7   PROT 6.6   ALBUMIN 3.6     Recent Labs   Lab 01/10/23  1138   LIPASE 27     No results for input(s): "AMMONIA" in the last 168 hours.    ABG    Component Value Date/Time    PHART 7.345 (L) 01/11/2023 0358    PCO2ART 40.5 01/11/2023 0358    PO2ART 84 01/11/2023 0358    HCO3 22.1 01/11/2023 0358    TCO2 23 01/11/2023 0358    ACIDBASEDEF 3.0 (H) 01/11/2023 0358    O2SAT 96 01/11/2023 0358       Coagulation Profile:  Recent Labs   Lab 01/10/23  1138  INR 1.3*     Cardiac Enzymes:  Recent Labs   Lab 01/10/23  1138   CKTOTAL 2,516*     HbA1C:  Hgb A1c MFr Bld   Date/Time Value Ref Range Status   12/17/2021 10:59 PM 5.9 (H) 4.8 - 5.6 % Final     Comment:     (NOTE)          Prediabetes: 5.7 - 6.4          Diabetes: >6.4          Glycemic control for adults with diabetes: <7.0    12/03/2020 04:54 PM 5.8 (H) 4.8 - 5.6 % Final     Comment:     (NOTE)  Pre diabetes:          5.7%-6.4%    Diabetes:              >6.4%    Glycemic control for   <7.0%  adults with diabetes      CBG:  Recent Labs   Lab 01/10/23  1952  01/10/23  2312 01/11/23  0359 01/11/23  0855 01/11/23  1103   GLUCAP 93 110* 101* 83 77         Critical care time: 40 min       Melody Comas, MD  [facility]  Office: [phone]    See Amion for personal pager  PCCM on call pager [phone] until 7pm.  Please call Elink 7p-7a. [phone]      Electronically signed by Martina Sinner, MD at 01/11/2023  1:12 PM EDT

## 2023-01-11 NOTE — Progress Notes (Signed)
Formatting of this note is different from the original.  Peripherally Inserted Central Catheter Placement    The IV Nurse has discussed with the patient and/or persons authorized to consent for the patient, the purpose of this procedure and the potential benefits and risks involved with this procedure.  The benefits include less needle sticks, lab draws from the catheter, and the patient may be discharged home with the catheter. Risks include, but not limited to, infection, bleeding, blood clot (thrombus formation), and puncture of an artery; nerve damage and irregular heartbeat and possibility to perform a PICC exchange if needed/ordered by physician.  Alternatives to this procedure were also discussed.  Bard Power PICC patient education guide, fact sheet on infection prevention and patient information card has been provided to patient /or left at bedside.  Consent obtained from wife at bedside.    PICC Placement Documentation   PICC Triple Lumen 01/11/23 Right Basilic 39 cm 0 cm (Active)   Indication for Insertion or Continuance of Line Vasoactive infusions 01/11/23 0820   Exposed Catheter (cm) 0 cm 01/11/23 0820   Site Assessment Clean, Dry, Intact 01/11/23 0820   Lumen #1 Status Saline locked;Flushed;Blood return noted 01/11/23 0820   Lumen #2 Status Saline locked;Flushed;Blood return noted 01/11/23 0820   Lumen #3 Status Saline locked;Flushed;Blood return noted 01/11/23 0820   Dressing Type Transparent;Securing device 01/11/23 0820   Dressing Status Antimicrobial disc in place;Clean, Dry, Intact 01/11/23 0820   Safety Lock Not Applicable 01/11/23 0820   Line Care Connections checked and tightened 01/11/23 0820   Line Adjustment (NICU/IV Team Only) No 01/11/23 0820   Dressing Intervention New dressing 01/11/23 0820   Dressing Change Due 01/18/23 01/11/23 0820     Burnard Bunting Chenice  01/11/2023, 8:21 AM    Electronically signed by Caleb Popp, RN at 01/11/2023  8:21 AM EDT

## 2023-01-11 NOTE — Progress Notes (Signed)
Formatting of this note is different from the original.  eLink Physician-Brief Progress Note  Patient Name: Lucas Hicks  DOB: December 02, 1980  MRN: [id number]    Date of Service   01/11/2023   HPI/Events of Note   42 year old was initially admitted with suspected drug overdose and was intubated.  Was extubated earlier today given significant agitation on proper respiratory status on spontaneous awakening trial.    Urgently called to the room due to severe agitation and progressive hypoxemia down to mid 80s.  On examination, the patient is super agitated, on a nonrebreather saturating in the mid to high 80s.   eICU Interventions   Chest radiograph is ordered as stat.  High flow nasal cannula is ordered.  For agitation, will initiate Precedex infusion, Haldol 2 mg IV once.  QTc within normal limits.  Patient has appropriate IV access for now, he has significant tachypnea.     Plan for ABG 20-30 minutes after high flow nasal cannula and Precedex     2135 - Has not urinated since foley removed today. Bladder scan showed 800cc.; In and out cath PRN for 3 attempts, replace foley if persistent retention     Intervention Category  Minor Interventions: Agitation / anxiety - evaluation and management    Aditya Paliwal  01/11/2023, 8:45 PM  Electronically signed by Conrad Burlington, MD at 01/11/2023  9:36 PM EDT

## 2023-01-12 ENCOUNTER — Inpatient Hospital Stay (HOSPITAL_COMMUNITY): Payer: Medicaid Other

## 2023-01-12 DIAGNOSIS — G929 Unspecified toxic encephalopathy: Secondary | ICD-10-CM | POA: Diagnosis not present

## 2023-01-12 LAB — CBC
HCT: 35.2 % — ABNORMAL LOW (ref 39.0–52.0)
Hemoglobin: 11.5 g/dL — ABNORMAL LOW (ref 13.0–17.0)
MCH: 29.3 pg (ref 26.0–34.0)
MCHC: 32.7 g/dL (ref 30.0–36.0)
MCV: 89.6 fL (ref 80.0–100.0)
Platelets: 188 10*3/uL (ref 150–400)
RBC: 3.93 MIL/uL — ABNORMAL LOW (ref 4.22–5.81)
RDW: 14.6 % (ref 11.5–15.5)
WBC: 13.6 10*3/uL — ABNORMAL HIGH (ref 4.0–10.5)
nRBC: 0 % (ref 0.0–0.2)

## 2023-01-12 LAB — BASIC METABOLIC PANEL
Anion gap: 11 (ref 5–15)
BUN: 24 mg/dL — ABNORMAL HIGH (ref 6–20)
CO2: 26 mmol/L (ref 22–32)
Calcium: 8.6 mg/dL — ABNORMAL LOW (ref 8.9–10.3)
Chloride: 102 mmol/L (ref 98–111)
Creatinine, Ser: 1.31 mg/dL — ABNORMAL HIGH (ref 0.61–1.24)
GFR, Estimated: >60 mL/min (ref >60)
Glucose, Bld: 83 mg/dL (ref 70–99)
Potassium: 4.3 mmol/L (ref 3.5–5.1)
Sodium: 139 mmol/L (ref 135–145)

## 2023-01-12 LAB — GLUCOSE, CAPILLARY
Glucose-Capillary: 112 mg/dL — ABNORMAL HIGH (ref 70–99)
Glucose-Capillary: 72 mg/dL (ref 70–99)
Glucose-Capillary: 78 mg/dL (ref 70–99)
Glucose-Capillary: 87 mg/dL (ref 70–99)
Glucose-Capillary: 91 mg/dL (ref 70–99)
Glucose-Capillary: 96 mg/dL (ref 70–99)

## 2023-01-12 LAB — MAGNESIUM: Magnesium: 2.7 mg/dL — ABNORMAL HIGH (ref 1.7–2.4)

## 2023-01-12 LAB — PHOSPHORUS: Phosphorus: 1.6 mg/dL — ABNORMAL LOW (ref 2.5–4.6)

## 2023-01-12 LAB — CALCIUM, IONIZED: Calcium, Ionized, Serum: 3.8 mg/dL — ABNORMAL LOW (ref 4.5–5.6)

## 2023-01-12 LAB — LACTIC ACID, PLASMA: Lactic Acid, Venous: 1.8 mmol/L (ref 0.5–1.9)

## 2023-01-12 MED ORDER — FUROSEMIDE 10 MG/ML IJ SOLN
20.0000 mg | Freq: Once | INTRAMUSCULAR | Status: AC
  Administered 2023-01-12: 20 mg via INTRAVENOUS
  Filled 2023-01-12: qty 2

## 2023-01-12 MED ORDER — SODIUM PHOSPHATES 45 MMOLE/15ML IV SOLN
45.0000 mmol | Freq: Once | INTRAVENOUS | Status: AC
  Administered 2023-01-12: 45 mmol via INTRAVENOUS
  Filled 2023-01-12: qty 15

## 2023-01-12 MED ORDER — FAMOTIDINE IN NACL 20-0.9 MG/50ML-% IV SOLN
20.0000 mg | INTRAVENOUS | Status: DC
  Filled 2023-01-12: qty 50

## 2023-01-12 MED ORDER — LEVOFLOXACIN IN D5W 750 MG/150ML IV SOLN
750.0000 mg | INTRAVENOUS | Status: DC
  Administered 2023-01-12 – 2023-01-13 (×2): 750 mg via INTRAVENOUS
  Filled 2023-01-12 (×3): qty 150

## 2023-01-12 MED ORDER — HALOPERIDOL LACTATE 5 MG/ML IJ SOLN: 5.0000 mg | Freq: Once | INTRAMUSCULAR | Status: AC

## 2023-01-12 MED ORDER — HALOPERIDOL LACTATE 5 MG/ML IJ SOLN
INTRAMUSCULAR | Status: AC
  Administered 2023-01-12: 5 mg via INTRAVENOUS
  Filled 2023-01-12: qty 1

## 2023-01-12 MED ORDER — DIAZEPAM 5 MG/ML IJ SOLN
5.0000 mg | Freq: Two times a day (BID) | INTRAMUSCULAR | Status: DC
  Administered 2023-01-12 – 2023-01-13 (×3): 5 mg via INTRAVENOUS
  Filled 2023-01-12 (×3): qty 2

## 2023-01-12 MED ORDER — ACETAMINOPHEN 10 MG/ML IV SOLN
1000.0000 mg | Freq: Once | INTRAVENOUS | Status: AC
  Administered 2023-01-12: 1000 mg via INTRAVENOUS
  Filled 2023-01-12: qty 100

## 2023-01-12 MED ORDER — FUROSEMIDE 10 MG/ML IJ SOLN
40.0000 mg | Freq: Once | INTRAMUSCULAR | Status: AC
  Administered 2023-01-12: 40 mg via INTRAVENOUS
  Filled 2023-01-12: qty 4

## 2023-01-12 MED ORDER — METRONIDAZOLE 500 MG/100ML IV SOLN
500.0000 mg | Freq: Two times a day (BID) | INTRAVENOUS | Status: DC
  Administered 2023-01-12 – 2023-01-13 (×4): 500 mg via INTRAVENOUS
  Filled 2023-01-12 (×4): qty 100

## 2023-01-12 MED ORDER — MIDAZOLAM HCL 2 MG/2ML IJ SOLN
2.0000 mg | Freq: Once | INTRAMUSCULAR | Status: AC
  Administered 2023-01-12: 2 mg via INTRAVENOUS
  Filled 2023-01-12: qty 2

## 2023-01-12 NOTE — Progress Notes (Signed)
Pharmacy Antibiotic Note  Randall Parsons is a 42 y.o. male admitted on 01/10/2023 with pneumonia.  Pharmacy has been consulted for levofloxacin dosing.  Flagyl added.  Renal function improving, Tmax 100.1, WBC up 13.6 .  Plan: Increase Levaquin to 750mg  IV Q24H Flagyl 500mg  IV Q12H per MD Monitor renal fxn, clinical progress and micro data  Height: 5\' 9"  (175.3 cm) Weight: 83.2 kg (183 lb 6.8 oz) IBW/kg (Calculated) : 70.7  Temp (24hrs), Avg:98.1 F (36.7 C), Min:97.3 F (36.3 C), Max:100.1 F (37.8 C)  Recent Labs  Lab 01/10/23 1138 01/10/23 1140 01/10/23 1348 01/10/23 1709 01/10/23 1711 01/11/23 0138 01/12/23 0116  WBC 2.7*  --   --   --   --  9.2 13.6*  CREATININE 3.86*  --  4.10* 3.08*  --  2.20* 1.31*  LATICACIDVEN  --  5.6*  --   --  4.5*  --   --      Estimated Creatinine Clearance: 72.7 mL/min (A) (by C-G formula based on SCr of 1.31 mg/dL (H)).    Allergies  Allergen Reactions   Amoxicillin Hives and Other (See Comments)    Did it involve swelling of the face/tongue/throat, SOB, or low BP? No Did it involve sudden or severe rash/hives, skin peeling, or any reaction on the inside of your mouth or nose? Yes Did you need to seek medical attention at a hospital or doctor's office? Yes When did it last happen?     42 yrs old If all above answers are "NO", may proceed with cephalosporin use.    Prednisone Other (See Comments)    irritable    LVQ 4/5 >>  Flagyl 4/7 >>   4/5 MRSA PCR - negative 4/5 BCx -  4/5 TA -   [name] D. Laney Potash, PharmD, BCPS, BCCCP 01/12/2023, 7:55 AM

## 2023-01-12 NOTE — Progress Notes (Signed)
[facility] ADULT ICU REPLACEMENT PROTOCOL   The patient does apply for the [facility] Adult ICU Electrolyte Replacment Protocol based on the criteria listed below:   1.Exclusion criteria: TCTS, ECMO, Dialysis, and Myasthenia Gravis patients 2. Is GFR >/= 30 ml/min? Yes.    Patient's GFR today is >60 3. Is SCr </= 2? Yes.   Patient's SCr is 1.31 mg/dL 4. Did SCr increase >/= 0.5 in 24 hours? No. 5.Pt's weight >40kg  Yes.   6. Abnormal electrolyte(s): Phos  7. Electrolytes replaced per protocol 8.  Call MD STAT for K+ </= 2.5, Phos </= 1, or Mag </= 1 Physician:  Thersa Salt Abdikadir Fohl 01/12/2023 5:19 AM

## 2023-01-12 NOTE — Progress Notes (Signed)
NAME:  Randall Parsons, MRN:  [id number], DOB:  [birth year], LOS: 2 ADMISSION DATE:  01/10/2023, CONSULTATION DATE:  4/5 REFERRING MD:  Charm Barges, CHIEF COMPLAINT:  presumed drug overdose    History of Present Illness:  This is a 42 year old male patient with history as outlined below.  He was found at a hotel on 4/5 by staff unresponsive, apneic with pinpoint pupils.  He was given Narcan in the field, after this became combative and staff was unable to redirect him.  On arrival to the ER he remained combative, his jaw was clenched, he was hypoxic, and ultimately intubated for airway protection. Initial blood glucose 101 Portable chest x-ray shows bilateral patchy airspace disease with endotracheal tube in satisfactory position Critical care asked to admit with working diagnosis of presumed drug overdose  Pertinent  Medical History  Polysubstance abuse, Hep C, IVDA, depression, ETOH, substance induced mood disorder   Significant Hospital Events: Including procedures, antibiotic start and stop dates in addition to other pertinent events   4/5 admitted after being found unresponsive hypoxic and agitated after Narcan. Intubated for airway protection. Started on Levaquin  4/6 extubated, overnight placed on HHFNC with increasing O2 needs  Interim History / Subjective:   Patient put on HHFNC overnight and had increased agitation this morning, placed on non-rebreather over HHFNC. Given haldol and versed this morning.   Wife at bedside, updated  Chest x-ray with increasing bilateral opacities  Objective   Blood pressure 114/69, pulse 88, temperature 100.1 F (37.8 C), temperature source Oral, resp. rate (!) 46, height 5\' 9"  (1.753 m), weight 83.2 kg, SpO2 91 %.    FiO2 (%):  [100 %] 100 %   Intake/Output Summary (Last 24 hours) at 01/12/2023 0739 Last data filed at 01/12/2023 0400 Gross per 24 hour  Intake 990.28 ml  Output 2375 ml  Net -1384.72 ml   Filed Weights   01/10/23 1100 01/12/23 0500   Weight: 81.8 kg 83.2 kg    Examination: done when intubated General: mild respiratory distress, HHFNC and non-rebreather in place, sedated HENT: moist mucous membranes, sclera anicteric Lungs: coarse bilateral breath sounds  Cardiovascular: tachycardic Abdomen: soft, BS+ Extremities: warm good pulses 1+ edema Neuro: PERRL, sedated GU: external foley  Resolved Hospital Problem list   hyoptension  Assessment & Plan:  Acute toxic encephalopathy presumed secondary to overdose superimposed known h/o polysubstance abuse, depression and IVDA Hx of benzodiazepine use Plan - Acutely worsened this morning - patient denied suicidal/homicidal intent 4/6 - schedule valium IV 5mg  BID, unsure if benzo dependent  Acute hypoxic respiratory failure secondary to aspiration pneumonia and pulmonary edema Plan Extubated 4/6, developed worsening respiratory status late last night and this morning Given lasix 40mg  IV F/u Respiratory culture  Continue Levaquin (allergic to PCN) and add flagyl today  Acute Kidney Injury Mild Rhabdomyolysis - received aggressive fluid resuscitation on admission - Stop LR fluids - Cr improving, good UOP  Elevated Troponin - stress induced from sepsis, hypoxia - no ishcemic EKG changes  Hypophosphatemia - replete as needed  Hep C Plan Contact precautions  Best Practice (right click and "Reselect all SmartList Selections" daily)   Diet/type: NPO DVT prophylaxis: SCD GI prophylaxis: H2B Lines: N/A Foley:  N/A Code Status:  full code Last date of multidisciplinary goals of care discussion [wife updated at bedside 4/7 ]  Labs   CBC: Recent Labs  Lab 01/10/23 1138 01/10/23 1302 01/10/23 1348 01/11/23 0138 01/11/23 0358 01/11/23 2152 01/12/23 0116  WBC 2.7*  --   --  9.2  --   --  13.6*  NEUTROABS 1.9  --   --   --   --   --   --   HGB 14.7   < > 15.6 13.0 12.6* 11.6* 11.5*  HCT 46.6   < > 46.0 38.4* 37.0* 34.0* 35.2*  MCV 93.8  --   --  88.9   --   --  89.6  PLT 277  --   --  187  --   --  188   < > = values in this interval not displayed.    Basic Metabolic Panel: Recent Labs  Lab 01/10/23 1138 01/10/23 1302 01/10/23 1348 01/10/23 1709 01/11/23 0138 01/11/23 0358 01/11/23 2152 01/12/23 0116  NA 140   < > 139 139 134* 137 140 139  K 4.1   < > 4.1 4.2 4.8 4.5 4.1 4.3  CL 99  --  101 100 102  --   --  102  CO2 21*  --   --  23 21*  --   --  26  GLUCOSE 103*  --  90 109* 104*  --   --  83  BUN 37*  --  37* 34* 35*  --   --  24*  CREATININE 3.86*  --  4.10* 3.08* 2.20*  --   --  1.31*  CALCIUM 8.0*  --   --  7.4* 7.6*  --   --  8.6*  MG  --   --   --   --  1.7  --   --  2.7*  PHOS  --   --   --   --  4.7*  --   --  1.6*   < > = values in this interval not displayed.   GFR: Estimated Creatinine Clearance: 72.7 mL/min (A) (by C-G formula based on SCr of 1.31 mg/dL (H)). Recent Labs  Lab 01/10/23 1138 01/10/23 1140 01/10/23 1709 01/10/23 1711 01/11/23 0138 01/12/23 0116  PROCALCITON  --   --  20.10  --   --   --   WBC 2.7*  --   --   --  9.2 13.6*  LATICACIDVEN  --  5.6*  --  4.5*  --   --     Liver Function Tests: Recent Labs  Lab 01/10/23 1138  AST 2,316*  ALT 2,154*  ALKPHOS 73  BILITOT 0.7  PROT 6.6  ALBUMIN 3.6   Recent Labs  Lab 01/10/23 1138  LIPASE 27   No results for input(s): "AMMONIA" in the last 168 hours.  ABG    Component Value Date/Time   PHART 7.436 01/11/2023 2152   PCO2ART 41.7 01/11/2023 2152   PO2ART 61 (L) 01/11/2023 2152   HCO3 28.1 (H) 01/11/2023 2152   TCO2 29 01/11/2023 2152   ACIDBASEDEF 3.0 (H) 01/11/2023 0358   O2SAT 92 01/11/2023 2152     Coagulation Profile: Recent Labs  Lab 01/10/23 1138  INR 1.3*    Cardiac Enzymes: Recent Labs  Lab 01/10/23 1138  CKTOTAL 2,516*    HbA1C: Hgb A1c MFr Bld  Date/Time Value Ref Range Status  12/17/2021 10:59 PM 5.9 (H) 4.8 - 5.6 % Final    Comment:    (NOTE)         Prediabetes: 5.7 - 6.4         Diabetes:  >6.4         Glycemic control for adults with diabetes: <7.0   12/03/2020 04:54 PM  5.8 (H) 4.8 - 5.6 % Final    Comment:    (NOTE) Pre diabetes:          5.7%-6.4%  Diabetes:              >6.4%  Glycemic control for   <7.0% adults with diabetes     CBG: Recent Labs  Lab 01/11/23 1503 01/11/23 1943 01/11/23 2314 01/12/23 0345 01/12/23 0712  GLUCAP 78 78 77 72 78       Critical care time: 45 min     Melody ComasJonathan Khylan Sawyer, MD [facility] Office: [phone]   See Amion for personal pager PCCM on call pager [phone] until 7pm. Please call Elink 7p-7a. [phone]

## 2023-01-12 NOTE — Progress Notes (Signed)
Formatting of this note might be different from the original.  [facility] ADULT ICU REPLACEMENT PROTOCOL    The patient does apply for the [facility] Adult ICU Electrolyte Replacment Protocol based on the criteria listed below:     1.Exclusion criteria: TCTS, ECMO, Dialysis, and Myasthenia Gravis patients  2. Is GFR >/= 30 ml/min? Yes.     Patient's GFR today is >60  3. Is SCr </= 2? Yes.    Patient's SCr is 1.31 mg/dL  4. Did SCr increase >/= 0.5 in 24 hours? No.  5.Pt's weight >40kg  Yes.    6. Abnormal electrolyte(s): Phos   7. Electrolytes replaced per protocol  8.  Call MD STAT for K+ </= 2.5, Phos </= 1, or Mag </= 1  Physician:  Thersa Salt White 01/12/2023 5:19 AM   Electronically signed by Garner Nash, RN at 01/12/2023  5:20 AM EDT

## 2023-01-12 NOTE — Progress Notes (Signed)
Formatting of this note is different from the original.  Images from the original note were not included.      NAME:  Lucas Hicks, MRN:  [id number], DOB:  [birth year], LOS: 2  ADMISSION DATE:  01/10/2023, CONSULTATION DATE:  4/5  REFERRING MD:  Charm Barges, CHIEF COMPLAINT:  presumed drug overdose      History of Present Illness:   This is a 42 year old male patient with history as outlined below.  He was found at a hotel on 4/5 by staff unresponsive, apneic with pinpoint pupils.  He was given Narcan in the field, after this became combative and staff was unable to redirect him.  On arrival to the ER he remained combative, his jaw was clenched, he was hypoxic, and ultimately intubated for airway protection.  Initial blood glucose 101  Portable chest x-ray shows bilateral patchy airspace disease with endotracheal tube in satisfactory position  Critical care asked to admit with working diagnosis of presumed drug overdose    Pertinent  Medical History   Polysubstance abuse, Hep C, IVDA, depression, ETOH, substance induced mood disorder     Significant Hospital Events:  Including procedures, antibiotic start and stop dates in addition to other pertinent events    4/5 admitted after being found unresponsive hypoxic and agitated after Narcan. Intubated for airway protection. Started on Levaquin   4/6 extubated, overnight placed on HHFNC with increasing O2 needs    Interim History / Subjective:     Patient put on HHFNC overnight and had increased agitation this morning, placed on non-rebreather over HHFNC. Given haldol and versed this morning.     Wife at bedside, updated    Chest x-ray with increasing bilateral opacities    Objective    Blood pressure 114/69, pulse 88, temperature 100.1 F (37.8 C), temperature source Oral, resp. rate (!) 46, height 5\' 9"  (1.753 m), weight 83.2 kg, SpO2 91 %.    FiO2 (%):  [100 %] 100 %     Intake/Output Summary (Last 24 hours) at 01/12/2023 0739  Last data filed at 01/12/2023 0400  Gross per 24 hour    Intake 990.28 ml   Output 2375 ml   Net -1384.72 ml     Filed Weights    01/10/23 1100 01/12/23 0500   Weight: 81.8 kg 83.2 kg     Examination: done when intubated  General: mild respiratory distress, HHFNC and non-rebreather in place, sedated  HENT: moist mucous membranes, sclera anicteric  Lungs: coarse bilateral breath sounds   Cardiovascular: tachycardic  Abdomen: soft, BS+  Extremities: warm good pulses 1+ edema  Neuro: PERRL, sedated  GU: external foley    Resolved Hospital Problem list    hyoptension    Assessment & Plan:   Acute toxic encephalopathy presumed secondary to overdose superimposed known h/o polysubstance abuse, depression and IVDA  Hx of benzodiazepine use  Plan  - Acutely worsened this morning  - patient denied suicidal/homicidal intent 4/6  - schedule valium IV 5mg  BID, unsure if benzo dependent    Acute hypoxic respiratory failure secondary to aspiration pneumonia and pulmonary edema  Plan  Extubated 4/6, developed worsening respiratory status late last night and this morning  Given lasix 40mg  IV  F/u Respiratory culture   Continue Levaquin (allergic to PCN) and add flagyl today    Acute Kidney Injury  Mild Rhabdomyolysis  - received aggressive fluid resuscitation on admission  - Stop LR fluids  - Cr improving, good UOP    Elevated Troponin  -  stress induced from sepsis, hypoxia  - no ishcemic EKG changes    Hypophosphatemia  - replete as needed    Hep C  Plan  Contact precautions    Best Practice (right click and "Reselect all SmartList Selections" daily)     Diet/type: NPO  DVT prophylaxis: SCD  GI prophylaxis: H2B  Lines: N/A  Foley:  N/A  Code Status:  full code  Last date of multidisciplinary goals of care discussion [wife updated at bedside 4/7 ]    Labs    CBC:  Recent Labs   Lab 01/10/23  1138 01/10/23  1302 01/10/23  1348 01/11/23  0138 01/11/23  0358 01/11/23  2152 01/12/23  0116   WBC 2.7*  --   --  9.2  --   --  13.6*   NEUTROABS 1.9  --   --   --   --   --   --    HGB 14.7   < >  15.6 13.0 12.6* 11.6* 11.5*   HCT 46.6   < > 46.0 38.4* 37.0* 34.0* 35.2*   MCV 93.8  --   --  88.9  --   --  89.6   PLT 277  --   --  187  --   --  188    < > = values in this interval not displayed.     Basic Metabolic Panel:  Recent Labs   Lab 01/10/23  1138 01/10/23  1302 01/10/23  1348 01/10/23  1709 01/11/23  0138 01/11/23  0358 01/11/23  2152 01/12/23  0116   NA 140   < > 139 139 134* 137 140 139   K 4.1   < > 4.1 4.2 4.8 4.5 4.1 4.3   CL 99  --  101 100 102  --   --  102   CO2 21*  --   --  23 21*  --   --  26   GLUCOSE 103*  --  90 109* 104*  --   --  83   BUN 37*  --  37* 34* 35*  --   --  24*   CREATININE 3.86*  --  4.10* 3.08* 2.20*  --   --  1.31*   CALCIUM 8.0*  --   --  7.4* 7.6*  --   --  8.6*   MG  --   --   --   --  1.7  --   --  2.7*   PHOS  --   --   --   --  4.7*  --   --  1.6*    < > = values in this interval not displayed.     GFR:  Estimated Creatinine Clearance: 72.7 mL/min (A) (by C-G formula based on SCr of 1.31 mg/dL (H)).  Recent Labs   Lab 01/10/23  1138 01/10/23  1140 01/10/23  1709 01/10/23  1711 01/11/23  0138 01/12/23  0116   PROCALCITON  --   --  20.10  --   --   --    WBC 2.7*  --   --   --  9.2 13.6*   LATICACIDVEN  --  5.6*  --  4.5*  --   --      Liver Function Tests:  Recent Labs   Lab 01/10/23  1138   AST 2,316*   ALT 2,154*   ALKPHOS 73   BILITOT 0.7   PROT 6.6   ALBUMIN  3.6     Recent Labs   Lab 01/10/23  1138   LIPASE 27     No results for input(s): "AMMONIA" in the last 168 hours.    ABG    Component Value Date/Time    PHART 7.436 01/11/2023 2152    PCO2ART 41.7 01/11/2023 2152    PO2ART 61 (L) 01/11/2023 2152    HCO3 28.1 (H) 01/11/2023 2152    TCO2 29 01/11/2023 2152    ACIDBASEDEF 3.0 (H) 01/11/2023 0358    O2SAT 92 01/11/2023 2152       Coagulation Profile:  Recent Labs   Lab 01/10/23  1138   INR 1.3*     Cardiac Enzymes:  Recent Labs   Lab 01/10/23  1138   CKTOTAL 2,516*     HbA1C:  Hgb A1c MFr Bld   Date/Time Value Ref Range Status   12/17/2021 10:59 PM 5.9 (H) 4.8  - 5.6 % Final     Comment:     (NOTE)          Prediabetes: 5.7 - 6.4          Diabetes: >6.4          Glycemic control for adults with diabetes: <7.0    12/03/2020 04:54 PM 5.8 (H) 4.8 - 5.6 % Final     Comment:     (NOTE)  Pre diabetes:          5.7%-6.4%    Diabetes:              >6.4%    Glycemic control for   <7.0%  adults with diabetes      CBG:  Recent Labs   Lab 01/11/23  1503 01/11/23  1943 01/11/23  2314 01/12/23  0345 01/12/23  0712   GLUCAP 78 78 77 72 78         Critical care time: 45 min       Melody Comas, MD  [facility]  Office: [phone]    See Amion for personal pager  PCCM on call pager [phone] until 7pm.  Please call Elink 7p-7a. [phone]      Electronically signed by Martina Sinner, MD at 01/12/2023  9:34 AM EDT

## 2023-01-12 NOTE — Progress Notes (Signed)
Formatting of this note is different from the original.  Pharmacy Antibiotic Note    Lucas Hicks is a 42 y.o. male admitted on 01/10/2023 with pneumonia.  Pharmacy has been consulted for levofloxacin dosing.  Flagyl added.    Renal function improving, Tmax 100.1, WBC up 13.6 .    Plan:  Increase Levaquin to 750mg  IV Q24H  Flagyl 500mg  IV Q12H per MD  Monitor renal fxn, clinical progress and micro data    Height: 5\' 9"  (175.3 cm)  Weight: 83.2 kg (183 lb 6.8 oz)  IBW/kg (Calculated) : 70.7    Temp (24hrs), Avg:98.1 F (36.7 C), Min:97.3 F (36.3 C), Max:100.1 F (37.8 C)    Recent Labs   Lab 01/10/23  1138 01/10/23  1140 01/10/23  1348 01/10/23  1709 01/10/23  1711 01/11/23  0138 01/12/23  0116   WBC 2.7*  --   --   --   --  9.2 13.6*   CREATININE 3.86*  --  4.10* 3.08*  --  2.20* 1.31*   LATICACIDVEN  --  5.6*  --   --  4.5*  --   --        Estimated Creatinine Clearance: 72.7 mL/min (A) (by C-G formula based on SCr of 1.31 mg/dL (H)).      Allergies   Allergen Reactions    Amoxicillin Hives and Other (See Comments)     Did it involve swelling of the face/tongue/throat, SOB, or low BP? No  Did it involve sudden or severe rash/hives, skin peeling, or any reaction on the inside of your mouth or nose? Yes  Did you need to seek medical attention at a hospital or doctor's office? Yes  When did it last happen?     42 yrs old  If all above answers are "NO", may proceed with cephalosporin use.     Prednisone Other (See Comments)     irritable     LVQ 4/5 >>   Flagyl 4/7 >>    4/5 MRSA PCR - negative  4/5 BCx -   4/5 TA -     Thuy D. Laney Potash, PharmD, BCPS, BCCCP  01/12/2023, 7:55 AM    Electronically signed by Gerilyn Nestle, RPH at 01/12/2023  7:55 AM EDT

## 2023-01-13 DIAGNOSIS — G929 Unspecified toxic encephalopathy: Secondary | ICD-10-CM | POA: Diagnosis not present

## 2023-01-13 LAB — GLUCOSE, CAPILLARY
Glucose-Capillary: 113 mg/dL — ABNORMAL HIGH (ref 70–99)
Glucose-Capillary: 125 mg/dL — ABNORMAL HIGH (ref 70–99)
Glucose-Capillary: 77 mg/dL (ref 70–99)
Glucose-Capillary: 77 mg/dL (ref 70–99)
Glucose-Capillary: 82 mg/dL (ref 70–99)
Glucose-Capillary: 87 mg/dL (ref 70–99)

## 2023-01-13 LAB — COMPREHENSIVE METABOLIC PANEL
ALT: 732 U/L — ABNORMAL HIGH (ref 0–44)
AST: 272 U/L — ABNORMAL HIGH (ref 15–41)
Albumin: 2.5 g/dL — ABNORMAL LOW (ref 3.5–5.0)
Alkaline Phosphatase: 89 U/L (ref 38–126)
Anion gap: 9 (ref 5–15)
BUN: 17 mg/dL (ref 6–20)
CO2: 26 mmol/L (ref 22–32)
Calcium: 8.2 mg/dL — ABNORMAL LOW (ref 8.9–10.3)
Chloride: 104 mmol/L (ref 98–111)
Creatinine, Ser: 0.98 mg/dL (ref 0.61–1.24)
GFR, Estimated: >60 mL/min (ref >60)
Glucose, Bld: 100 mg/dL — ABNORMAL HIGH (ref 70–99)
Potassium: 3.4 mmol/L — ABNORMAL LOW (ref 3.5–5.1)
Sodium: 139 mmol/L (ref 135–145)
Total Bilirubin: 1.3 mg/dL — ABNORMAL HIGH (ref 0.3–1.2)
Total Protein: 6 g/dL — ABNORMAL LOW (ref 6.5–8.1)

## 2023-01-13 LAB — CBC
HCT: 39.4 % (ref 39.0–52.0)
Hemoglobin: 12.7 g/dL — ABNORMAL LOW (ref 13.0–17.0)
MCH: 29.3 pg (ref 26.0–34.0)
MCHC: 32.2 g/dL (ref 30.0–36.0)
MCV: 90.8 fL (ref 80.0–100.0)
Platelets: 171 10*3/uL (ref 150–400)
RBC: 4.34 MIL/uL (ref 4.22–5.81)
RDW: 14.7 % (ref 11.5–15.5)
WBC: 18.8 10*3/uL — ABNORMAL HIGH (ref 4.0–10.5)
nRBC: 0 % (ref 0.0–0.2)

## 2023-01-13 LAB — EXPECTORATED SPUTUM ASSESSMENT W GRAM STAIN, RFLX TO RESP C

## 2023-01-13 LAB — CULTURE, BLOOD (ROUTINE X 2)
Culture: NO GROWTH
Special Requests: ADEQUATE
Special Requests: ADEQUATE

## 2023-01-13 LAB — CULTURE, RESPIRATORY W GRAM STAIN

## 2023-01-13 LAB — PHOSPHORUS: Phosphorus: 2.4 mg/dL — ABNORMAL LOW (ref 2.5–4.6)

## 2023-01-13 LAB — MAGNESIUM: Magnesium: 2.1 mg/dL (ref 1.7–2.4)

## 2023-01-13 MED ORDER — POTASSIUM CHLORIDE 10 MEQ/50ML IV SOLN
10.0000 meq | INTRAVENOUS | Status: AC
  Administered 2023-01-13 (×4): 10 meq via INTRAVENOUS
  Filled 2023-01-13 (×4): qty 50

## 2023-01-13 MED ORDER — DIAZEPAM 5 MG/ML IJ SOLN
2.5000 mg | Freq: Three times a day (TID) | INTRAMUSCULAR | Status: DC | PRN
  Administered 2023-01-13 – 2023-01-14 (×2): 2.5 mg via INTRAVENOUS
  Filled 2023-01-13 (×2): qty 2

## 2023-01-13 MED ORDER — ORAL CARE MOUTH RINSE: 15.0000 mL | OROMUCOSAL | Status: DC | PRN

## 2023-01-13 MED ORDER — LAMOTRIGINE 100 MG PO TABS
100.0000 mg | ORAL_TABLET | Freq: Two times a day (BID) | ORAL | Status: DC
  Administered 2023-01-13 – 2023-01-18 (×11): 100 mg via ORAL
  Filled 2023-01-13 (×13): qty 1

## 2023-01-13 MED ORDER — GUAIFENESIN 100 MG/5ML PO LIQD
5.0000 mL | ORAL | Status: DC | PRN
  Administered 2023-01-13 – 2023-01-17 (×10): 5 mL via ORAL
  Filled 2023-01-13 (×12): qty 5

## 2023-01-13 MED ORDER — POTASSIUM PHOSPHATES 15 MMOLE/5ML IV SOLN
15.0000 mmol | Freq: Once | INTRAVENOUS | Status: AC
  Administered 2023-01-13: 15 mmol via INTRAVENOUS
  Filled 2023-01-13: qty 5

## 2023-01-13 MED ORDER — IBUPROFEN 600 MG PO TABS
600.0000 mg | ORAL_TABLET | Freq: Four times a day (QID) | ORAL | Status: AC | PRN
  Administered 2023-01-13: 600 mg via ORAL
  Filled 2023-01-13: qty 3

## 2023-01-13 MED ORDER — HALOPERIDOL LACTATE 5 MG/ML IJ SOLN
5.0000 mg | Freq: Four times a day (QID) | INTRAMUSCULAR | Status: DC | PRN
  Administered 2023-01-13: 5 mg via INTRAVENOUS
  Filled 2023-01-13: qty 1

## 2023-01-13 MED ORDER — ACETAMINOPHEN 325 MG PO TABS: 650.0000 mg | ORAL_TABLET | Freq: Four times a day (QID) | ORAL | Status: DC | PRN

## 2023-01-13 MED ORDER — MELATONIN 5 MG PO TABS
5.0000 mg | ORAL_TABLET | Freq: Every evening | ORAL | Status: DC | PRN
  Administered 2023-01-13 – 2023-01-17 (×5): 5 mg via ORAL
  Filled 2023-01-13 (×5): qty 1

## 2023-01-13 MED ORDER — ORAL CARE MOUTH RINSE
15.0000 mL | OROMUCOSAL | Status: DC
  Administered 2023-01-13 – 2023-01-18 (×14): 15 mL via OROMUCOSAL

## 2023-01-13 NOTE — Progress Notes (Signed)
NAME:  Randall Parsons, MRN:  [id number], DOB:  10-Sep-1981, LOS: 3 ADMISSION DATE:  01/10/2023, CONSULTATION DATE:  01/13/23  REFERRING MD:  Charm Barges, CHIEF COMPLAINT:  presumed drug overdose    History of Present Illness:  This is a 42 year old male patient with history as outlined below.  He was found at a hotel on 4/5 by staff unresponsive, apneic with pinpoint pupils.  He was given Narcan in the field, after this became combative and staff was unable to redirect him.  On arrival to the ER he remained combative, his jaw was clenched, he was hypoxic, and ultimately intubated for airway protection. Initial blood glucose 101 Portable chest x-ray shows bilateral patchy airspace disease with endotracheal tube in satisfactory position Critical care asked to admit with working diagnosis of presumed drug overdose  Pertinent  Medical History  Polysubstance abuse, Hep C, IVDA, depression, ETOH, substance induced mood disorder   Significant Hospital Events: Including procedures, antibiotic start and stop dates in addition to other pertinent events   4/5 admitted after being found unresponsive hypoxic and agitated after Narcan. Intubated for airway protection. Started on Levaquin  4/6 extubated, overnight placed on HHFNC with increasing O2 needs  Interim History / Subjective:   Patient remained on HHFNC overnight. He had intermittent agitation with weaning of his sedation overnight. Did not require haldol overnight. Patient was weaned off of Precedex this morning and has not been agitated. He is still getting Valium BID.  Objective   Blood pressure 104/67, pulse 76, temperature (!) 100.4 F (38 C), temperature source Axillary, resp. rate (!) 30, height 5\' 9"  (1.753 m), weight 82.8 kg, SpO2 94 %.    FiO2 (%):  [50 %-70 %] 50 %   Intake/Output Summary (Last 24 hours) at 01/13/2023 1056 Last data filed at 01/13/2023 1000 Gross per 24 hour  Intake 1788.44 ml  Output 2400 ml  Net -611.56 ml   Filed  Weights   01/10/23 1100 01/12/23 0500 01/13/23 0500  Weight: 81.8 kg 83.2 kg 82.8 kg    Examination:  General: drowsy but awakens to voice, answers questions, follows commands HENT: moist mucous membranes, sclera anicteric Lungs: lungs clear to auscultation bilaterally Cardiovascular: RRR, no murmurs Abdomen: soft, non-tender, BS+ Extremities: warm and well perfused, 2+ distal pulses, trace edema bilaterally Neuro: PERRL, awakens to voice, answers questions, follows commands. Somewhat somnolent given recent Valium dose GU: external foley  Resolved Hospital Problem list   Hypotension  Assessment & Plan:  Acute toxic encephalopathy presumed secondary to overdose superimposed known h/o polysubstance abuse, depression and IVDA Hx of benzodiazepine use Plan - Agitation improved this morning. Patient has been weaned off of Precedex - Switch Valium to PRN - patient denied suicidal/homicidal intent 4/6  Acute hypoxic respiratory failure secondary to aspiration pneumonia and pulmonary edema Plan - Extubated 4/6, in no respiratory distress on HFNC. - Transition to Salter cannula today - Diuresing well on Lasix 40mg  IV.  - MRSA swab negative, viral panel negative. - Continue Levaquin and Flagyl today  Acute Kidney Injury Mild Rhabdomyolysis - received aggressive fluid resuscitation on admission - Cr improving, good UOP on Lasix  Elevated Troponin - stress induced from sepsis, hypoxia - no ishcemic EKG changes  Hypophosphatemia - replete as needed  Hep C Plan Contact precautions  Best Practice (right click and "Reselect all SmartList Selections" daily)   Diet/type: NPO DVT prophylaxis: SCD GI prophylaxis: N/A Lines: N/A Foley:  N/A Code Status:  full code Last date of multidisciplinary goals of care  discussion [wife updated at bedside 4/8 ]  Labs   CBC: Recent Labs  Lab 01/10/23 1138 01/10/23 1302 01/11/23 0138 01/11/23 0358 01/11/23 2152 01/12/23 0116  01/13/23 0211  WBC 2.7*  --  9.2  --   --  13.6* 18.8*  NEUTROABS 1.9  --   --   --   --   --   --   HGB 14.7   < > 13.0 12.6* 11.6* 11.5* 12.7*  HCT 46.6   < > 38.4* 37.0* 34.0* 35.2* 39.4  MCV 93.8  --  88.9  --   --  89.6 90.8  PLT 277  --  187  --   --  188 171   < > = values in this interval not displayed.    Basic Metabolic Panel: Recent Labs  Lab 01/10/23 1138 01/10/23 1302 01/10/23 1348 01/10/23 1709 01/11/23 0138 01/11/23 0358 01/11/23 2152 01/12/23 0116 01/13/23 0211  NA 140   < > 139 139 134* 137 140 139 139  K 4.1   < > 4.1 4.2 4.8 4.5 4.1 4.3 3.4*  CL 99  --  101 100 102  --   --  102 104  CO2 21*  --   --  23 21*  --   --  26 26  GLUCOSE 103*  --  90 109* 104*  --   --  83 100*  BUN 37*  --  37* 34* 35*  --   --  24* 17  CREATININE 3.86*  --  4.10* 3.08* 2.20*  --   --  1.31* 0.98  CALCIUM 8.0*  --   --  7.4* 7.6*  --   --  8.6* 8.2*  MG  --   --   --   --  1.7  --   --  2.7* 2.1  PHOS  --   --   --   --  4.7*  --   --  1.6* 2.4*   < > = values in this interval not displayed.   GFR: Estimated Creatinine Clearance: 97.2 mL/min (by C-G formula based on SCr of 0.98 mg/dL). Recent Labs  Lab 01/10/23 1138 01/10/23 1140 01/10/23 1709 01/10/23 1711 01/11/23 0138 01/12/23 0116 01/12/23 0922 01/13/23 0211  PROCALCITON  --   --  20.10  --   --   --   --   --   WBC 2.7*  --   --   --  9.2 13.6*  --  18.8*  LATICACIDVEN  --  5.6*  --  4.5*  --   --  1.8  --     Liver Function Tests: Recent Labs  Lab 01/10/23 1138 01/13/23 0211  AST 2,316* 272*  ALT 2,154* 732*  ALKPHOS 73 89  BILITOT 0.7 1.3*  PROT 6.6 6.0*  ALBUMIN 3.6 2.5*   Recent Labs  Lab 01/10/23 1138  LIPASE 27   No results for input(s): "AMMONIA" in the last 168 hours.  ABG    Component Value Date/Time   PHART 7.436 01/11/2023 2152   PCO2ART 41.7 01/11/2023 2152   PO2ART 61 (L) 01/11/2023 2152   HCO3 28.1 (H) 01/11/2023 2152   TCO2 29 01/11/2023 2152   ACIDBASEDEF 3.0 (H)  01/11/2023 0358   O2SAT 92 01/11/2023 2152     Coagulation Profile: Recent Labs  Lab 01/10/23 1138  INR 1.3*    Cardiac Enzymes: Recent Labs  Lab 01/10/23 1138  CKTOTAL 2,516*    HbA1C: Hgb A1c  MFr Bld  Date/Time Value Ref Range Status  12/17/2021 10:59 PM 5.9 (H) 4.8 - 5.6 % Final    Comment:    (NOTE)         Prediabetes: 5.7 - 6.4         Diabetes: >6.4         Glycemic control for adults with diabetes: <7.0   12/03/2020 04:54 PM 5.8 (H) 4.8 - 5.6 % Final    Comment:    (NOTE) Pre diabetes:          5.7%-6.4%  Diabetes:              >6.4%  Glycemic control for   <7.0% adults with diabetes     CBG: Recent Labs  Lab 01/12/23 1525 01/12/23 1949 01/12/23 2330 01/13/23 0345 01/13/23 0756  GLUCAP 91 87 96 77 87       Critical care time: 36 min

## 2023-01-13 NOTE — Progress Notes (Signed)
Patient has been managed for possible drug overdose leading to encephalopathy, Aspiration pneumonia and Hypoxemic respiratory failure. Background history of polysubstance abuse hepatitis C, IV drug abuse. Currently on 30L HF 02. TOC following.

## 2023-01-13 NOTE — Progress Notes (Signed)
[facility] ADULT ICU REPLACEMENT PROTOCOL   The patient does apply for the [facility] Adult ICU Electrolyte Replacment Protocol based on the criteria listed below:   1.Exclusion criteria: TCTS, ECMO, Dialysis, and Myasthenia Gravis patients 2. Is GFR >/= 30 ml/min? Yes.    Patient's GFR today is >60 3. Is SCr </= 2? Yes.   Patient's SCr is 0.98 mg/dL 4. Did SCr increase >/= 0.5 in 24 hours? No. 5.Pt's weight >40kg  Yes.   6. Abnormal electrolyte(s): K  7. Electrolytes replaced per protocol 8.  Call MD STAT for K+ </= 2.5, Phos </= 1, or Mag </= 1 Physician:  Thersa Salt Jacy Brocker 01/13/2023 4:57 AM

## 2023-01-13 NOTE — Progress Notes (Addendum)
eLink Physician-Brief Progress Note Patient Name: Randall Parsons DOB: [birth year] MRN: [id number]   Date of Service  01/13/2023  HPI/Events of Note  42 year old male initially found unresponsive with benzo and cocaine use.  He was eventually extubated, was delirious for some time, placed on high flow nasal cannula.  Ongoing agitation requiring Haldol, Versed, Precedex.  Appears to be better overall today.  Requesting melatonin for sleep.  Has had ongoing fevers up to 102.1 with some transaminitis.  Normal renal function.  eICU Interventions  Will order melatonin as needed.  Add on ibuprofen first-line for fevers, second line acetaminophen also ordered.    2210 - Ongoing agitation, off drips. QTC okay, will add Haldol for severe agitation   Intervention Category Minor Interventions: Routine modifications to care plan (e.g. PRN medications for pain, fever)  Roderick Sweezy 01/13/2023, 9:05 PM

## 2023-01-13 NOTE — Progress Notes (Signed)
Formatting of this note is different from the original.  Images from the original note were not included.      NAME:  Lucas Hicks, MRN:  [id number], DOB:  Apr 28, 1981, LOS: 3  ADMISSION DATE:  01/10/2023, CONSULTATION DATE:  01/13/23   REFERRING MD:  Charm Barges, CHIEF COMPLAINT:  presumed drug overdose      History of Present Illness:   This is a 42 year old male patient with history as outlined below.  He was found at a hotel on 4/5 by staff unresponsive, apneic with pinpoint pupils.  He was given Narcan in the field, after this became combative and staff was unable to redirect him.  On arrival to the ER he remained combative, his jaw was clenched, he was hypoxic, and ultimately intubated for airway protection.  Initial blood glucose 101  Portable chest x-ray shows bilateral patchy airspace disease with endotracheal tube in satisfactory position  Critical care asked to admit with working diagnosis of presumed drug overdose    Pertinent  Medical History   Polysubstance abuse, Hep C, IVDA, depression, ETOH, substance induced mood disorder     Significant Hospital Events:  Including procedures, antibiotic start and stop dates in addition to other pertinent events    4/5 admitted after being found unresponsive hypoxic and agitated after Narcan. Intubated for airway protection. Started on Levaquin   4/6 extubated, overnight placed on HHFNC with increasing O2 needs    Interim History / Subjective:     Patient remained on HHFNC overnight. He had intermittent agitation with weaning of his sedation overnight. Did not require haldol overnight. Patient was weaned off of Precedex this morning and has not been agitated. He is still getting Valium BID.    Objective    Blood pressure 104/67, pulse 76, temperature (!) 100.4 F (38 C), temperature source Axillary, resp. rate (!) 30, height 5\' 9"  (1.753 m), weight 82.8 kg, SpO2 94 %.    FiO2 (%):  [50 %-70 %] 50 %     Intake/Output Summary (Last 24 hours) at 01/13/2023 1056  Last data filed  at 01/13/2023 1000  Gross per 24 hour   Intake 1788.44 ml   Output 2400 ml   Net -611.56 ml     Filed Weights    01/10/23 1100 01/12/23 0500 01/13/23 0500   Weight: 81.8 kg 83.2 kg 82.8 kg     Examination:   General: drowsy but awakens to voice, answers questions, follows commands  HENT: moist mucous membranes, sclera anicteric  Lungs: lungs clear to auscultation bilaterally  Cardiovascular: RRR, no murmurs  Abdomen: soft, non-tender, BS+  Extremities: warm and well perfused, 2+ distal pulses, trace edema bilaterally  Neuro: PERRL, awakens to voice, answers questions, follows commands. Somewhat somnolent given recent Valium dose  GU: external foley    Resolved Hospital Problem list    Hypotension    Assessment & Plan:   Acute toxic encephalopathy presumed secondary to overdose superimposed known h/o polysubstance abuse, depression and IVDA  Hx of benzodiazepine use  Plan  - Agitation improved this morning. Patient has been weaned off of Precedex  - Switch Valium to PRN  - patient denied suicidal/homicidal intent 4/6    Acute hypoxic respiratory failure secondary to aspiration pneumonia and pulmonary edema  Plan  - Extubated 4/6, in no respiratory distress on HFNC.  - Transition to Salter cannula today  - Diuresing well on Lasix 40mg  IV.   - MRSA swab negative, viral panel negative.  - Continue Levaquin and Flagyl today  Acute Kidney Injury  Mild Rhabdomyolysis  - received aggressive fluid resuscitation on admission  - Cr improving, good UOP on Lasix    Elevated Troponin  - stress induced from sepsis, hypoxia  - no ishcemic EKG changes    Hypophosphatemia  - replete as needed    Hep C  Plan  Contact precautions    Best Practice (right click and "Reselect all SmartList Selections" daily)     Diet/type: NPO  DVT prophylaxis: SCD  GI prophylaxis: N/A  Lines: N/A  Foley:  N/A  Code Status:  full code  Last date of multidisciplinary goals of care discussion [wife updated at bedside 4/8 ]    Labs    CBC:  Recent Labs   Lab  01/10/23  1138 01/10/23  1302 01/11/23  0138 01/11/23  0358 01/11/23  2152 01/12/23  0116 01/13/23  0211   WBC 2.7*  --  9.2  --   --  13.6* 18.8*   NEUTROABS 1.9  --   --   --   --   --   --    HGB 14.7   < > 13.0 12.6* 11.6* 11.5* 12.7*   HCT 46.6   < > 38.4* 37.0* 34.0* 35.2* 39.4   MCV 93.8  --  88.9  --   --  89.6 90.8   PLT 277  --  187  --   --  188 171    < > = values in this interval not displayed.     Basic Metabolic Panel:  Recent Labs   Lab 01/10/23  1138 01/10/23  1302 01/10/23  1348 01/10/23  1709 01/11/23  0138 01/11/23  0358 01/11/23  2152 01/12/23  0116 01/13/23  0211   NA 140   < > 139 139 134* 137 140 139 139   K 4.1   < > 4.1 4.2 4.8 4.5 4.1 4.3 3.4*   CL 99  --  101 100 102  --   --  102 104   CO2 21*  --   --  23 21*  --   --  26 26   GLUCOSE 103*  --  90 109* 104*  --   --  83 100*   BUN 37*  --  37* 34* 35*  --   --  24* 17   CREATININE 3.86*  --  4.10* 3.08* 2.20*  --   --  1.31* 0.98   CALCIUM 8.0*  --   --  7.4* 7.6*  --   --  8.6* 8.2*   MG  --   --   --   --  1.7  --   --  2.7* 2.1   PHOS  --   --   --   --  4.7*  --   --  1.6* 2.4*    < > = values in this interval not displayed.     GFR:  Estimated Creatinine Clearance: 97.2 mL/min (by C-G formula based on SCr of 0.98 mg/dL).  Recent Labs   Lab 01/10/23  1138 01/10/23  1140 01/10/23  1709 01/10/23  1711 01/11/23  0138 01/12/23  0116 01/12/23  0922 01/13/23  0211   PROCALCITON  --   --  20.10  --   --   --   --   --    WBC 2.7*  --   --   --  9.2 13.6*  --  18.8*   LATICACIDVEN  --  5.6*  --  4.5*  --   --  1.8  --      Liver Function Tests:  Recent Labs   Lab 01/10/23  1138 01/13/23  0211   AST 2,316* 272*   ALT 2,154* 732*   ALKPHOS 73 89   BILITOT 0.7 1.3*   PROT 6.6 6.0*   ALBUMIN 3.6 2.5*     Recent Labs   Lab 01/10/23  1138   LIPASE 27     No results for input(s): "AMMONIA" in the last 168 hours.    ABG    Component Value Date/Time    PHART 7.436 01/11/2023 2152    PCO2ART 41.7 01/11/2023 2152    PO2ART 61 (L) 01/11/2023 2152     HCO3 28.1 (H) 01/11/2023 2152    TCO2 29 01/11/2023 2152    ACIDBASEDEF 3.0 (H) 01/11/2023 0358    O2SAT 92 01/11/2023 2152       Coagulation Profile:  Recent Labs   Lab 01/10/23  1138   INR 1.3*     Cardiac Enzymes:  Recent Labs   Lab 01/10/23  1138   CKTOTAL 2,516*     HbA1C:  Hgb A1c MFr Bld   Date/Time Value Ref Range Status   12/17/2021 10:59 PM 5.9 (H) 4.8 - 5.6 % Final     Comment:     (NOTE)          Prediabetes: 5.7 - 6.4          Diabetes: >6.4          Glycemic control for adults with diabetes: <7.0    12/03/2020 04:54 PM 5.8 (H) 4.8 - 5.6 % Final     Comment:     (NOTE)  Pre diabetes:          5.7%-6.4%    Diabetes:              >6.4%    Glycemic control for   <7.0%  adults with diabetes      CBG:  Recent Labs   Lab 01/12/23  1525 01/12/23  1949 01/12/23  2330 01/13/23  0345 01/13/23  0756   GLUCAP 91 87 96 77 87         Critical care time: 36 min         Electronically signed by Martina Sinner, MD at 01/13/2023  4:41 PM EDT    Associated attestation - Martina Sinner, MD - 01/13/2023  4:41 PM EDT  Formatting of this note is different from the original.  Attending:      Subjective:  2M with polysubtance abuse admitted for overdose and respiratory failure who was extubated 4/6 with increasing respiratory distress morning of 4/7 that responded to diuresis and HHFNC.     No acute issues overnight.     Objective:  Vitals:    01/13/23 1529 01/13/23 1530 01/13/23 1600 01/13/23 1613   BP:    (!) 118/91   Pulse:  85 (!) 180    Resp:  (!) 36 (!) 36    Temp: (!) 101.9 F (38.8 C)      TempSrc: Oral      SpO2:  90% 100%    Weight:       Height:         FiO2 (%):  [50 %-70 %] 50 %    Intake/Output Summary (Last 24 hours) at 01/13/2023 1632  Last data filed at 01/13/2023 1600  Gross per 24 hour  Intake 1399.38 ml   Output 3200 ml   Net -1800.62 ml     General: no acute distress, resting in bed  HEENT: NC/AT, moist mucous membranes, sclera anicteric  Neuro: alert, moving all extremities  CV: rrr, s1s2, no  murmurs  PULM: course breath sounds. No wheezing  GI: soft, non-tender, non-distended, BS+  Extremities: warm, no edema  Skin: no rashes     CBC    Component Value Date/Time    WBC 18.8 (H) 01/13/2023 0211    RBC 4.34 01/13/2023 0211    HGB 12.7 (L) 01/13/2023 0211    HCT 39.4 01/13/2023 0211    PLT 171 01/13/2023 0211    MCV 90.8 01/13/2023 0211    MCH 29.3 01/13/2023 0211    MCHC 32.2 01/13/2023 0211    RDW 14.7 01/13/2023 0211    LYMPHSABS 0.6 (L) 01/10/2023 1138    MONOABS 0.3 01/10/2023 1138    EOSABS 0.0 01/10/2023 1138    BASOSABS 0.0 01/10/2023 1138     BMET    Component Value Date/Time    NA 139 01/13/2023 0211    K 3.4 (L) 01/13/2023 0211    CL 104 01/13/2023 0211    CO2 26 01/13/2023 0211    GLUCOSE 100 (H) 01/13/2023 0211    BUN 17 01/13/2023 0211    CREATININE 0.98 01/13/2023 0211    CALCIUM 8.2 (L) 01/13/2023 0211    GFRNONAA >60 01/13/2023 0211    GFRAA >60 02/12/2020 2120     Impression/Plan:  I agree with the impression and plan as outlined by Bishop Limbo, MSIV with emphasis on the following:    Patient is critically ill in setting of drug overdose and acute hypoxemic respiratory failure due to multifocal pneumonia and pulmonary edema initially requiring mechanical ventilation and now HHFNC. We will try to wean his O2 today. Continue levaquin and flagyl for pneumonia. He is febrile. Will try to collect sputum sample. MRSA screen negative. Follow up CXR tomorrow.    My cc time 35 minutes    Melody Comas, MD  [facility]  Office: [phone]    See Amion for personal pager  PCCM on call pager [phone] until 7pm.  Please call Elink 7p-7a. [phone]

## 2023-01-13 NOTE — Progress Notes (Signed)
Formatting of this note is different from the original.  eLink Physician-Brief Progress Note  Patient Name: Lucas Hicks  DOB: [birth year]  MRN: [id number]    Date of Service   01/13/2023   HPI/Events of Note   42 year old male initially found unresponsive with benzo and cocaine use.  He was eventually extubated, was delirious for some time, placed on high flow nasal cannula.  Ongoing agitation requiring Haldol, Versed, Precedex.  Appears to be better overall today.  Requesting melatonin for sleep.  Has had ongoing fevers up to 102.1 with some transaminitis.  Normal renal function.   eICU Interventions   Will order melatonin as needed.    Add on ibuprofen first-line for fevers, second line acetaminophen also ordered.     2210 - Ongoing agitation, off drips. QTC okay, will add Haldol for severe agitation    Intervention Category  Minor Interventions: Routine modifications to care plan (e.g. PRN medications for pain, fever)    Aditya Paliwal  01/13/2023, 9:05 PM  Electronically signed by Conrad Burlington, MD at 01/13/2023 10:14 PM EDT

## 2023-01-13 NOTE — Progress Notes (Signed)
Formatting of this note might be different from the original.  Patient has been managed for possible drug overdose leading to encephalopathy, Aspiration pneumonia and Hypoxemic respiratory failure.  Background history of polysubstance abuse hepatitis C, IV drug abuse.  Currently on 30L HF 02.  TOC following.     Electronically signed by Harriet Masson, RN at 01/13/2023  2:19 PM EDT

## 2023-01-13 NOTE — Progress Notes (Signed)
Formatting of this note might be different from the original.  [facility] ADULT ICU REPLACEMENT PROTOCOL    The patient does apply for the [facility] Adult ICU Electrolyte Replacment Protocol based on the criteria listed below:     1.Exclusion criteria: TCTS, ECMO, Dialysis, and Myasthenia Gravis patients  2. Is GFR >/= 30 ml/min? Yes.     Patient's GFR today is >60  3. Is SCr </= 2? Yes.    Patient's SCr is 0.98 mg/dL  4. Did SCr increase >/= 0.5 in 24 hours? No.  5.Pt's weight >40kg  Yes.    6. Abnormal electrolyte(s): K   7. Electrolytes replaced per protocol  8.  Call MD STAT for K+ </= 2.5, Phos </= 1, or Mag </= 1  Physician:  Thersa Salt White 01/13/2023 4:57 AM   Electronically signed by Garner Nash, RN at 01/13/2023  4:58 AM EDT

## 2023-01-14 ENCOUNTER — Inpatient Hospital Stay (HOSPITAL_COMMUNITY): Payer: Medicaid Other

## 2023-01-14 ENCOUNTER — Other Ambulatory Visit (HOSPITAL_COMMUNITY): Payer: Medicaid Other

## 2023-01-14 DIAGNOSIS — I214 Non-ST elevation (NSTEMI) myocardial infarction: Secondary | ICD-10-CM | POA: Diagnosis not present

## 2023-01-14 DIAGNOSIS — G929 Unspecified toxic encephalopathy: Secondary | ICD-10-CM | POA: Diagnosis not present

## 2023-01-14 LAB — ECHOCARDIOGRAM COMPLETE
Area-P 1/2: 3.93 cm2
Height: 69 in
S' Lateral: 3.4 cm
Weight: 2920.65 oz

## 2023-01-14 LAB — CBC WITH DIFFERENTIAL/PLATELET
Abs Immature Granulocytes: 0.4 10*3/uL — ABNORMAL HIGH (ref 0.00–0.07)
Basophils Absolute: 0 10*3/uL (ref 0.0–0.1)
Basophils Relative: 0 %
Eosinophils Absolute: 0 10*3/uL (ref 0.0–0.5)
Eosinophils Relative: 0 %
HCT: 35.3 % — ABNORMAL LOW (ref 39.0–52.0)
Hemoglobin: 11.9 g/dL — ABNORMAL LOW (ref 13.0–17.0)
Lymphocytes Relative: 12 %
Lymphs Abs: 1.5 10*3/uL (ref 0.7–4.0)
MCH: 29.8 pg (ref 26.0–34.0)
MCHC: 33.7 g/dL (ref 30.0–36.0)
MCV: 88.3 fL (ref 80.0–100.0)
Metamyelocytes Relative: 1 %
Monocytes Absolute: 1.3 10*3/uL — ABNORMAL HIGH (ref 0.1–1.0)
Monocytes Relative: 10 %
Myelocytes: 2 %
Neutro Abs: 9.6 10*3/uL — ABNORMAL HIGH (ref 1.7–7.7)
Neutrophils Relative %: 75 %
Platelets: 174 10*3/uL (ref 150–400)
RBC: 4 MIL/uL — ABNORMAL LOW (ref 4.22–5.81)
RDW: 15.1 % (ref 11.5–15.5)
WBC: 12.8 10*3/uL — ABNORMAL HIGH (ref 4.0–10.5)
nRBC: 0 % (ref 0.0–0.2)
nRBC: 0 /100 WBC

## 2023-01-14 LAB — COMPREHENSIVE METABOLIC PANEL
ALT: 393 U/L — ABNORMAL HIGH (ref 0–44)
AST: 151 U/L — ABNORMAL HIGH (ref 15–41)
Albumin: 2.2 g/dL — ABNORMAL LOW (ref 3.5–5.0)
Alkaline Phosphatase: 95 U/L (ref 38–126)
Anion gap: 10 (ref 5–15)
BUN: 19 mg/dL (ref 6–20)
CO2: 24 mmol/L (ref 22–32)
Calcium: 8.1 mg/dL — ABNORMAL LOW (ref 8.9–10.3)
Chloride: 105 mmol/L (ref 98–111)
Creatinine, Ser: 1.04 mg/dL (ref 0.61–1.24)
GFR, Estimated: >60 mL/min (ref >60)
Glucose, Bld: 101 mg/dL — ABNORMAL HIGH (ref 70–99)
Potassium: 3.3 mmol/L — ABNORMAL LOW (ref 3.5–5.1)
Sodium: 139 mmol/L (ref 135–145)
Total Bilirubin: 0.8 mg/dL (ref 0.3–1.2)
Total Protein: 5.9 g/dL — ABNORMAL LOW (ref 6.5–8.1)

## 2023-01-14 LAB — PHOSPHORUS: Phosphorus: 3.4 mg/dL (ref 2.5–4.6)

## 2023-01-14 LAB — CULTURE, BLOOD (ROUTINE X 2): Special Requests: ADEQUATE

## 2023-01-14 LAB — GLUCOSE, CAPILLARY: Glucose-Capillary: 93 mg/dL (ref 70–99)

## 2023-01-14 MED ORDER — SENNA 8.6 MG PO TABS
1.0000 | ORAL_TABLET | Freq: Every day | ORAL | Status: DC
  Administered 2023-01-14 – 2023-01-17 (×5): 8.6 mg via ORAL
  Filled 2023-01-14 (×5): qty 1

## 2023-01-14 MED ORDER — BUDESONIDE 0.25 MG/2ML IN SUSP
0.2500 mg | Freq: Two times a day (BID) | RESPIRATORY_TRACT | Status: DC
  Administered 2023-01-14 – 2023-01-17 (×7): 0.25 mg via RESPIRATORY_TRACT
  Filled 2023-01-14 (×9): qty 2

## 2023-01-14 MED ORDER — POTASSIUM CHLORIDE CRYS ER 20 MEQ PO TBCR
20.0000 meq | EXTENDED_RELEASE_TABLET | ORAL | Status: AC
  Administered 2023-01-14 (×2): 20 meq via ORAL
  Filled 2023-01-14 (×2): qty 1

## 2023-01-14 MED ORDER — CALCIUM CARBONATE ANTACID 500 MG PO CHEW
1.0000 | CHEWABLE_TABLET | Freq: Two times a day (BID) | ORAL | Status: DC
  Administered 2023-01-14 – 2023-01-18 (×8): 200 mg via ORAL
  Filled 2023-01-14 (×8): qty 1

## 2023-01-14 MED ORDER — FUROSEMIDE 10 MG/ML IJ SOLN
40.0000 mg | Freq: Two times a day (BID) | INTRAMUSCULAR | Status: AC
  Administered 2023-01-14 (×2): 40 mg via INTRAVENOUS
  Filled 2023-01-14 (×2): qty 4

## 2023-01-14 MED ORDER — LEVOFLOXACIN IN D5W 750 MG/150ML IV SOLN
750.0000 mg | INTRAVENOUS | Status: DC
  Administered 2023-01-14: 750 mg via INTRAVENOUS
  Filled 2023-01-14 (×2): qty 150

## 2023-01-14 MED ORDER — CLONAZEPAM 1 MG PO TABS: 1.0000 mg | ORAL_TABLET | Freq: Two times a day (BID) | ORAL | Status: DC

## 2023-01-14 MED ORDER — TRAMADOL HCL 50 MG PO TABS
50.0000 mg | ORAL_TABLET | Freq: Four times a day (QID) | ORAL | Status: DC | PRN
  Administered 2023-01-14 – 2023-01-18 (×9): 50 mg via ORAL
  Filled 2023-01-14 (×10): qty 1

## 2023-01-14 MED ORDER — POLYVINYL ALCOHOL 1.4 % OP SOLN
1.0000 [drp] | OPHTHALMIC | Status: DC | PRN
  Administered 2023-01-14: 1 [drp] via OPHTHALMIC
  Filled 2023-01-14: qty 15

## 2023-01-14 MED ORDER — PERFLUTREN LIPID MICROSPHERE
1.0000 mL | INTRAVENOUS | Status: AC | PRN
  Administered 2023-01-14: 4 mL via INTRAVENOUS

## 2023-01-14 MED ORDER — PREDNISONE 20 MG PO TABS
40.0000 mg | ORAL_TABLET | Freq: Every day | ORAL | Status: DC
  Administered 2023-01-14 – 2023-01-15 (×2): 40 mg via ORAL
  Filled 2023-01-14 (×2): qty 2

## 2023-01-14 MED ORDER — HYDROCOD POLI-CHLORPHE POLI ER 10-8 MG/5ML PO SUER
5.0000 mL | Freq: Two times a day (BID) | ORAL | Status: DC | PRN
  Administered 2023-01-14 – 2023-01-16 (×5): 5 mL via ORAL
  Filled 2023-01-14 (×5): qty 5

## 2023-01-14 MED ORDER — ALTEPLASE 2 MG IJ SOLR
2.0000 mg | Freq: Once | INTRAMUSCULAR | Status: DC
  Filled 2023-01-14: qty 2

## 2023-01-14 MED ORDER — CLONAZEPAM 0.5 MG PO TABS
0.5000 mg | ORAL_TABLET | Freq: Two times a day (BID) | ORAL | Status: DC | PRN
  Administered 2023-01-14 – 2023-01-18 (×9): 0.5 mg via ORAL
  Filled 2023-01-14 (×9): qty 1

## 2023-01-14 MED ORDER — ACETAMINOPHEN 325 MG PO TABS
650.0000 mg | ORAL_TABLET | Freq: Four times a day (QID) | ORAL | Status: DC | PRN
  Administered 2023-01-15 – 2023-01-17 (×2): 650 mg via ORAL
  Filled 2023-01-14 (×3): qty 2

## 2023-01-14 MED ORDER — POTASSIUM CHLORIDE 10 MEQ/50ML IV SOLN
10.0000 meq | INTRAVENOUS | Status: AC
  Administered 2023-01-14 (×4): 10 meq via INTRAVENOUS
  Filled 2023-01-14 (×4): qty 50

## 2023-01-14 MED ORDER — ONDANSETRON HCL 4 MG/2ML IJ SOLN
4.0000 mg | Freq: Four times a day (QID) | INTRAMUSCULAR | Status: DC | PRN
  Administered 2023-01-14 – 2023-01-15 (×2): 4 mg via INTRAVENOUS
  Filled 2023-01-14 (×2): qty 2

## 2023-01-14 MED ORDER — ARFORMOTEROL TARTRATE 15 MCG/2ML IN NEBU
15.0000 ug | INHALATION_SOLUTION | Freq: Two times a day (BID) | RESPIRATORY_TRACT | Status: DC
  Administered 2023-01-14 – 2023-01-17 (×7): 15 ug via RESPIRATORY_TRACT
  Filled 2023-01-14 (×9): qty 2

## 2023-01-14 NOTE — Progress Notes (Signed)
[facility] ADULT ICU REPLACEMENT PROTOCOL   The patient does apply for the [facility] Adult ICU Electrolyte Replacment Protocol based on the criteria listed below:   1.Exclusion criteria: TCTS, ECMO, Dialysis, and Myasthenia Gravis patients 2. Is GFR >/= 30 ml/min? Yes.    Patient's GFR today is >60 3. Is SCr </= 2? Yes.   Patient's SCr is 1.04 mg/dL 4. Did SCr increase >/= 0.5 in 24 hours? No. 5.Pt's weight >40kg  Yes.   6. Abnormal electrolyte(s): potassium 3.3  7. Electrolytes replaced per protocol 8.  Call MD STAT for K+ </= 2.5, Phos </= 1, or Mag </= 1 Physician:  protocol  Melvern Banker 01/14/2023 6:57 AM

## 2023-01-14 NOTE — Progress Notes (Signed)
   NAME:  Randall Parsons, MRN:  [id number], DOB:  September 15, 1981, LOS: 4 ADMISSION DATE:  01/10/2023, CONSULTATION DATE:  4/5 REFERRING MD:  Charm Barges, CHIEF COMPLAINT:  presumed drug overdose    History of Present Illness:  This is a 42 year old male patient with history as outlined below.  He was found at a hotel on 4/5 by staff unresponsive, apneic with pinpoint pupils.  He was given Narcan in the field, after this became combative and staff was unable to redirect him.  On arrival to the ER he remained combative, his jaw was clenched, he was hypoxic, and ultimately intubated for airway protection. Initial blood glucose 101 Portable chest x-ray shows bilateral patchy airspace disease with endotracheal tube in satisfactory position Critical care asked to admit with working diagnosis of presumed drug overdose  Pertinent  Medical History  Polysubstance abuse, Hep C, IVDA, depression, ETOH, substance induced mood disorder   Significant Hospital Events: Including procedures, antibiotic start and stop dates in addition to other pertinent events   4/5 admitted after being found unresponsive hypoxic and agitated after Narcan. Intubated for airway protection. Started on Levaquin  4/6 extubated, overnight placed on HHFNC with increasing O2 needs  Interim History / Subjective:  Breathing has improved with diuresis.  Objective   Blood pressure 119/66, pulse 78, temperature 99.1 F (37.3 C), temperature source Axillary, resp. rate (!) 30, height 5\' 9"  (1.753 m), weight 82.8 kg, SpO2 98 %.    FiO2 (%):  [50 %-71 %] 70 %   Intake/Output Summary (Last 24 hours) at 01/14/2023 0704 Last data filed at 01/14/2023 0500 Gross per 24 hour  Intake 1060.32 ml  Output 2650 ml  Net -1589.68 ml    Filed Weights   01/10/23 1100 01/12/23 0500 01/13/23 0500  Weight: 81.8 kg 83.2 kg 82.8 kg   No distress No rhonci, mildly tachypneic Ext warm Aox3, mildly anxious Moves ext to command Cough with talking No edema  K  low being repleted Shock liver improved WBC improved Hgb stable  CXR improved  Resolved Hospital Problem list   hyoptension  Assessment & Plan:  Acute hypoxic respiratory failure secondary to aspiration pneumonia and pulmonary edema Type II NSTEMI Polysubstance abuse- IVDA in past [facility] acquired deconditioning  - Check echo, may need some GDMT - TOC consult for OP rehab services - PT/OT up to chair - Low dose klonipin, wean to PRN tomorrow - Short course of steroids and nebs for concurrent inflammatory bonrchitis - Continue lasix as tolerated by renal function - Abx to levaquin x 5 days total - Okay for stepdown, will check on him tomorrow to assure heading in right direction (still on HHFNC) - OP f/u of HepC  Remaining issues: echo ?GDMT, diuresis, O2 wean, TOC consult  Myrla Halsted MD PCCM

## 2023-01-14 NOTE — Evaluation (Signed)
Physical Therapy Evaluation Patient Details Name: Randall Parsons MRN: [id number] DOB: 1981/06/29 Today's Date: 01/14/2023  History of Present Illness  42 yo male admitted 4/5 after found unresponsive in hotel. Combative after narcan. Intubated for airway protection 4/5-4/6. Aspiration PNA with respiratory failure. PMHx: Polysubstance abuse, Hep C, IVDA, depression, ETOH, substance induced mood disorder  Clinical Impression  Pt pleasant and eager to mobilize stating he works full time, goes to the gym regularly and does not wear O2 at home. Pt with benefit of RW this session for stability but do not anticipate it will be needed at D/C. Pt with decreased activity tolerance and mobility who will benefit from acute therapy to maximize independence and safety. Encouraged OOB daily with nursing staff.   SpO2 100% NRB HR 95     Recommendations for follow up therapy are one component of a multi-disciplinary discharge planning process, led by the attending physician.  Recommendations may be updated based on patient status, additional functional criteria and insurance authorization.  Follow Up Recommendations       Assistance Recommended at Discharge PRN  Patient can return home with the following  Assist for transportation    Equipment Recommendations None recommended by PT  Recommendations for Other Services       Functional Status Assessment Patient has had a recent decline in their functional status and demonstrates the ability to make significant improvements in function in a reasonable and predictable amount of time.     Precautions / Restrictions Precautions Precautions: Fall Precaution Comments: HHFNC Restrictions Weight Bearing Restrictions: No      Mobility  Bed Mobility               General bed mobility comments: in chair on arrival and end of session    Transfers Overall transfer level: Needs assistance   Transfers: Sit to/from Stand Sit to Stand: Min guard            General transfer comment: cues for hand placement    Ambulation/Gait Ambulation/Gait assistance: Min guard Gait Distance (Feet): 450 Feet Assistive device: Rolling walker (2 wheels) Gait Pattern/deviations: Step-through pattern, Decreased stride length   Gait velocity interpretation: >2.62 ft/sec, indicative of community ambulatory   General Gait Details: pt able to ambulate long hall distance with support of RW with cues for posture and proximity to RW on NRB with SPO2 100%, RT transitioned to [place] end of session at [employer] Mobility    Modified Rankin (Stroke Patients Only)       Balance Overall balance assessment: Mild deficits observed, not formally tested                                           Pertinent Vitals/Pain Pain Assessment Pain Assessment: No/denies pain    Home Living Family/patient expects to be discharged to:: Private residence Living Arrangements: Spouse/significant other;Children Available Help at Discharge: Family;Available PRN/intermittently Type of Home: House Home Access: Stairs to enter Entrance Stairs-Rails: None Entrance Stairs-Number of Steps: 1   Home Layout: Able to live on main level with bedroom/bathroom;Two level Home Equipment: Hand held shower head Additional Comments: Pt and wife getting divorced - per chart, pt found in hotel    Prior Function Prior Level of Function : Independent/Modified Independent  Mobility Comments: no AD ADLs Comments: works full time     Higher education careers adviser   Dominant Hand: Right    Extremity/Trunk Assessment   Upper Extremity Assessment Upper Extremity Assessment: Overall WFL for tasks assessed LUE Deficits / Details: globally 4/5. weaker than R. denies paraesthesias LUE Sensation: WNL LUE Coordination: WNL    Lower Extremity Assessment Lower Extremity Assessment: Overall WFL for tasks assessed    Cervical / Trunk  Assessment Cervical / Trunk Assessment: Other exceptions Cervical / Trunk Exceptions: forward head, rounded shoulders  Communication   Communication: No difficulties  Cognition Arousal/Alertness: Awake/alert Behavior During Therapy: WFL for tasks assessed/performed Overall Cognitive Status: Impaired/Different from baseline Area of Impairment: Memory                                        General Comments General comments (skin integrity, edema, etc.): VSS on HHFNC    Exercises     Assessment/Plan    PT Assessment Patient needs continued PT services  PT Problem List Decreased mobility;Decreased activity tolerance;Decreased balance;Decreased knowledge of use of DME       PT Treatment Interventions Gait training;Therapeutic exercise;Patient/family education;Stair training;Balance training;Functional mobility training;DME instruction;Therapeutic activities    PT Goals (Current goals can be found in the Care Plan section)  Acute Rehab PT Goals Patient Stated Goal: return to home and work PT Goal Formulation: With patient Time For Goal Achievement: 01/21/23 Potential to Achieve Goals: Good    Frequency Min 3X/week     Co-evaluation               AM-PAC PT "6 Clicks" Mobility  Outcome Measure Help needed turning from your back to your side while in a flat bed without using bedrails?: None Help needed moving from lying on your back to sitting on the side of a flat bed without using bedrails?: A Little Help needed moving to and from a bed to a chair (including a wheelchair)?: A Little Help needed standing up from a chair using your arms (e.g., wheelchair or bedside chair)?: A Little Help needed to walk in hospital room?: A Little Help needed climbing 3-5 steps with a railing? : A Little 6 Click Score: 19    End of Session Equipment Utilized During Treatment: Oxygen Activity Tolerance: Patient tolerated treatment well Patient left: in chair;with call  bell/phone within reach Nurse Communication: Mobility status PT Visit Diagnosis: Other abnormalities of gait and mobility (R26.89)    Time: 5208-0223 PT Time Calculation (min) (ACUTE ONLY): 22 min   Charges:   PT Evaluation $PT Eval Moderate Complexity: 1 Mod          Randall Parsons, PT Acute Rehabilitation Services Office: [phone]   Randall Parsons 01/14/2023, 12:41 PM

## 2023-01-14 NOTE — Progress Notes (Signed)
Report given to receiving RN Candise Bowens on 5W. Pt transported VSS no issues. Wife Leia Alf was updated on transfer.

## 2023-01-14 NOTE — Progress Notes (Signed)
  Echocardiogram 2D Echocardiogram has been performed.  Randall Parsons 01/14/2023, 2:34 PM

## 2023-01-14 NOTE — Evaluation (Signed)
Occupational Therapy Evaluation Patient Details Name: Randall Parsons MRN: [id number] DOB: August 21, 1981 Today's Date: 01/14/2023   History of Present Illness 42 yo male admitted 4/5 after found unresponsive in hotel. Combative after narcan. Intubated for airway protection 4/5-4/6. Aspiration PNA with respiratory failure. PMHx: Polysubstance abuse, Hep C, IVDA, depression, ETOH, substance induced mood disorder   Clinical Impression   Randall PraderHeath was evaluated s/p the above admission list. He reports working full time and being indep at baseline. Upon evaluation he was limited by cardiopulmonary endurance, weakness, poor balance, decreased activity tolerance and limited insight. Overall he needed mod A to rise with HHA and min A to ambulate in the room. Due to the deficits listed below he also requires mod A for LB ADLs and set up for UB ADLs in sitting. Pt will benefit from continued acute OT services. Pt will likely progress well acutely and not need follow up OT.       Recommendations for follow up therapy are one component of a multi-disciplinary discharge planning process, led by the attending physician.  Recommendations may be updated based on patient status, additional functional criteria and insurance authorization.   Assistance Recommended at Discharge Intermittent Supervision/Assistance  Patient can return home with the following A little help with bathing/dressing/bathroom;Assistance with cooking/housework;Direct supervision/assist for medications management;Assist for transportation;Help with stairs or ramp for entrance    Functional Status Assessment  Patient has had a recent decline in their functional status and demonstrates the ability to make significant improvements in function in a reasonable and predictable amount of time.  Equipment Recommendations  None recommended by OT       Precautions / Restrictions Precautions Precautions: Fall Precaution Comments: HHFNC Restrictions Weight  Bearing Restrictions: No      Mobility Bed Mobility               General bed mobility comments: OOB upon arrival, returned to chair    Transfers Overall transfer level: Needs assistance Equipment used: 1 person hand held assist Transfers: Sit to/from Stand Sit to Stand: Mod assist           General transfer comment: mod A to rise      Balance Overall balance assessment: Needs assistance Sitting-balance support: Feet supported Sitting balance-Leahy Scale: Poor Sitting balance - Comments: unable to sustain unsupported sitting for >2 minutes   Standing balance support: Bilateral upper extremity supported, During functional activity Standing balance-Leahy Scale: Poor Standing balance comment: prefers BUE supported                           ADL either performed or assessed with clinical judgement   ADL Overall ADL's : Needs assistance/impaired Eating/Feeding: Independent;Sitting   Grooming: Set up;Sitting   Upper Body Bathing: Set up;Sitting   Lower Body Bathing: Moderate assistance;Sit to/from stand   Upper Body Dressing : Set up;Sitting   Lower Body Dressing: Moderate assistance;Sit to/from stand   Toilet Transfer: Minimal assistance;Ambulation   Toileting- Clothing Manipulation and Hygiene: Min guard;Sit to/from stand       Functional mobility during ADLs: Moderate assistance General ADL Comments: limited by activity tolerance and weakness     Vision Baseline Vision/History: 0 No visual deficits Vision Assessment?: No apparent visual deficits     Perception Perception Perception Tested?: No   Praxis Praxis Praxis tested?: Not tested    Pertinent Vitals/Pain Pain Assessment Pain Assessment: Faces Faces Pain Scale: Hurts a little bit Pain Location: "everywhere" Pain Descriptors / Indicators:  Discomfort Pain Intervention(s): Limited activity within patient's tolerance, Monitored during session     Hand Dominance Right    Extremity/Trunk Assessment Upper Extremity Assessment Upper Extremity Assessment: Overall WFL for tasks assessed LUE Deficits / Details: globally 4/5. weaker than R. denies paraesthesias LUE Sensation: WNL LUE Coordination: WNL   Lower Extremity Assessment Lower Extremity Assessment: Overall WFL for tasks assessed   Cervical / Trunk Assessment Cervical / Trunk Assessment: Other exceptions Cervical / Trunk Exceptions: forward head, rounded shoulders   Communication Communication Communication: No difficulties   Cognition Arousal/Alertness: Awake/alert Behavior During Therapy: WFL for tasks assessed/performed Overall Cognitive Status: No family/caregiver present to determine baseline cognitive functioning                                 General Comments: A&Ox4, followed commands appropriately. Limited insight. Questionable historian     General Comments  VSS on [facility]    Exercises     Shoulder Instructions      Home Living Family/patient expects to be discharged to:: Private residence Living Arrangements: Spouse/significant other;Children Available Help at Discharge: Family;Available PRN/intermittently Type of Home: House Home Access: Stairs to enter Entrance Stairs-Number of Steps: 1 Entrance Stairs-Rails: None Home Layout: Able to live on main level with bedroom/bathroom;Two level     Bathroom Shower/Tub: Producer, television/film/video: Standard     Home Equipment: Hand held shower head   Additional Comments: Pt and wife getting divorced - per chart, pt found in hotel      Prior Functioning/Environment Prior Level of Function : Independent/Modified Independent             Mobility Comments: no AD ADLs Comments: works full time        OT Problem List: Decreased activity tolerance;Decreased safety awareness;Decreased knowledge of use of DME or AE;Decreased knowledge of precautions;Cardiopulmonary status limiting activity      OT  Treatment/Interventions: Self-care/ADL training;Therapeutic exercise;DME and/or AE instruction;Energy conservation;Balance training;Patient/family education;Therapeutic activities    OT Goals(Current goals can be found in the care plan section) Acute Rehab OT Goals Patient Stated Goal: home OT Goal Formulation: With patient Time For Goal Achievement: 01/28/23 Potential to Achieve Goals: Good ADL Goals Pt Will Perform Grooming: with modified independence;standing Pt Will Perform Lower Body Dressing: with modified independence;sit to/from stand Pt Will Transfer to Toilet: with modified independence;ambulating Additional ADL Goal #1: Pt will demontrate increased activity tolerance by completing at least 10 minutes of standing functional tasks  OT Frequency: Min 2X/week    Co-evaluation              AM-PAC OT "6 Clicks" Daily Activity     Outcome Measure Help from another person eating meals?: None Help from another person taking care of personal grooming?: A Little Help from another person toileting, which includes using toliet, bedpan, or urinal?: A Little Help from another person bathing (including washing, rinsing, drying)?: A Lot Help from another person to put on and taking off regular upper body clothing?: A Little Help from another person to put on and taking off regular lower body clothing?: A Lot 6 Click Score: 17   End of Session Equipment Utilized During Treatment: Gait belt;Oxygen Nurse Communication: Mobility status  Activity Tolerance: Patient limited by fatigue Patient left: in chair;with call bell/phone within reach  OT Visit Diagnosis: Unsteadiness on feet (R26.81);Muscle weakness (generalized) (M62.81)  Time: 0630-1601 OT Time Calculation (min): 19 min Charges:  OT General Charges $OT Visit: 1 Visit OT Evaluation $OT Eval Moderate Complexity: 1 Mod  Derenda Mis, OTR/L Acute Rehabilitation Services Office [phone] Secure Chat  Communication Preferred   Donia Pounds 01/14/2023, 1:58 PM

## 2023-01-14 NOTE — Progress Notes (Signed)
Formatting of this note is different from the original.  Images from the original note were not included.      NAME:  Lucas Hicks, MRN:  [id number], DOB:  1981-09-03, LOS: 4  ADMISSION DATE:  01/10/2023, CONSULTATION DATE:  4/5  REFERRING MD:  Charm Barges, CHIEF COMPLAINT:  presumed drug overdose      History of Present Illness:   This is a 42 year old male patient with history as outlined below.  He was found at a hotel on 4/5 by staff unresponsive, apneic with pinpoint pupils.  He was given Narcan in the field, after this became combative and staff was unable to redirect him.  On arrival to the ER he remained combative, his jaw was clenched, he was hypoxic, and ultimately intubated for airway protection.  Initial blood glucose 101  Portable chest x-ray shows bilateral patchy airspace disease with endotracheal tube in satisfactory position  Critical care asked to admit with working diagnosis of presumed drug overdose    Pertinent  Medical History   Polysubstance abuse, Hep C, IVDA, depression, ETOH, substance induced mood disorder     Significant Hospital Events:  Including procedures, antibiotic start and stop dates in addition to other pertinent events    4/5 admitted after being found unresponsive hypoxic and agitated after Narcan. Intubated for airway protection. Started on Levaquin   4/6 extubated, overnight placed on HHFNC with increasing O2 needs    Interim History / Subjective:   Breathing has improved with diuresis.    Objective    Blood pressure 119/66, pulse 78, temperature 99.1 F (37.3 C), temperature source Axillary, resp. rate (!) 30, height 5\' 9"  (1.753 m), weight 82.8 kg, SpO2 98 %.    FiO2 (%):  [50 %-71 %] 70 %     Intake/Output Summary (Last 24 hours) at 01/14/2023 0704  Last data filed at 01/14/2023 0500  Gross per 24 hour   Intake 1060.32 ml   Output 2650 ml   Net -1589.68 ml     Filed Weights    01/10/23 1100 01/12/23 0500 01/13/23 0500   Weight: 81.8 kg 83.2 kg 82.8 kg     No distress  No rhonci,  mildly tachypneic  Ext warm  Aox3, mildly anxious  Moves ext to command  Cough with talking  No edema    K low being repleted  Shock liver improved  WBC improved  Hgb stable    CXR improved    Resolved Hospital Problem list    hyoptension    Assessment & Plan:   Acute hypoxic respiratory failure secondary to aspiration pneumonia and pulmonary edema  Type II NSTEMI  Polysubstance abuse- IVDA in past  [facility]  Hospital acquired deconditioning    - Check echo, may need some GDMT  - TOC consult for OP rehab services  - PT/OT up to chair  - Low dose klonipin, wean to PRN tomorrow  - Short course of steroids and nebs for concurrent inflammatory bonrchitis  - Continue lasix as tolerated by renal function  - Abx to levaquin x 5 days total  - Okay for stepdown, will check on him tomorrow to assure heading in right direction (still on HHFNC)  - OP f/u of HepC    Remaining issues: echo ?GDMT, diuresis, O2 wean, TOC consult    Myrla Halsted MD PCCM    Electronically signed by Lorin Glass, MD at 01/14/2023  7:14 AM EDT

## 2023-01-14 NOTE — Progress Notes (Signed)
Formatting of this note might be different from the original.  Report given to receiving RN Candise Bowens on 5W. Pt transported VSS no issues. Wife Leia Alf was updated on transfer.  Electronically signed by Kathleen Argue, RN at 01/14/2023  2:50 PM EDT

## 2023-01-14 NOTE — Progress Notes (Signed)
Formatting of this note might be different from the original.  [facility] ADULT ICU REPLACEMENT PROTOCOL    The patient does apply for the [facility] Adult ICU Electrolyte Replacment Protocol based on the criteria listed below:     1.Exclusion criteria: TCTS, ECMO, Dialysis, and Myasthenia Gravis patients  2. Is GFR >/= 30 ml/min? Yes.     Patient's GFR today is >60  3. Is SCr </= 2? Yes.    Patient's SCr is 1.04 mg/dL  4. Did SCr increase >/= 0.5 in 24 hours? No.  5.Pt's weight >40kg  Yes.    6. Abnormal electrolyte(s): potassium 3.3   7. Electrolytes replaced per protocol  8.  Call MD STAT for K+ </= 2.5, Phos </= 1, or Mag </= 1  Physician:  protocol    Melvern Banker 01/14/2023 6:57 AM    Electronically signed by Melvern Banker, RN at 01/14/2023  6:58 AM EDT

## 2023-01-14 NOTE — Unmapped (Signed)
Formatting of this note is different from the original.  Occupational Therapy Evaluation  Patient Details  Name: Lucas Hicks  MRN: [id number]  DOB: [birth year]  Today's Date: 01/14/2023    History of Present Illness 42 yo male admitted 4/5 after found unresponsive in hotel. Combative after narcan. Intubated for airway protection 4/5-4/6. Aspiration PNA with respiratory failure. PMHx: Polysubstance abuse, Hep C, IVDA, depression, ETOH, substance induced mood disorder     Clinical Impression    Lucas Hicks was evaluated s/p the above admission list. He reports working full time and being indep at baseline. Upon evaluation he was limited by cardiopulmonary endurance, weakness, poor balance, decreased activity tolerance and limited insight. Overall he needed mod A to rise with HHA and min A to ambulate in the room. Due to the deficits listed below he also requires mod A for LB ADLs and set up for UB ADLs in sitting. Pt will benefit from continued acute OT services. Pt will likely progress well acutely and not need follow up OT.        Recommendations for follow up therapy are one component of a multi-disciplinary discharge planning process, led by the attending physician.  Recommendations may be updated based on patient status, additional functional criteria and insurance authorization.     Assistance Recommended at Discharge Intermittent Supervision/Assistance   Patient can return home with the following A little help with bathing/dressing/bathroom;Assistance with cooking/housework;Direct supervision/assist for medications management;Assist for transportation;Help with stairs or ramp for entrance      Functional Status Assessment   Patient has had a recent decline in their functional status and demonstrates the ability to make significant improvements in function in a reasonable and predictable amount of time.   Equipment Recommendations   None recommended by OT       Precautions / Restrictions Precautions  Precautions:  Fall  Precaution Comments: HHFNC  Restrictions  Weight Bearing Restrictions: No         Mobility Bed Mobility                General bed mobility comments: OOB upon arrival, returned to chair      Transfers  Overall transfer level: Needs assistance  Equipment used: 1 person hand held assist  Transfers: Sit to/from Stand  Sit to Stand: Mod assist            General transfer comment: mod A to rise        Balance Overall balance assessment: Needs assistance  Sitting-balance support: Feet supported  Sitting balance-Leahy Scale: Poor  Sitting balance - Comments: unable to sustain unsupported sitting for >2 minutes    Standing balance support: Bilateral upper extremity supported, During functional activity  Standing balance-Leahy Scale: Poor  Standing balance comment: prefers BUE supported                            ADL either performed or assessed with clinical judgement     ADL Overall ADL's : Needs assistance/impaired  Eating/Feeding: Independent;Sitting    Grooming: Set up;Sitting    Upper Body Bathing: Set up;Sitting    Lower Body Bathing: Moderate assistance;Sit to/from stand    Upper Body Dressing : Set up;Sitting    Lower Body Dressing: Moderate assistance;Sit to/from stand    Toilet Transfer: Minimal assistance;Ambulation    Toileting- Clothing Manipulation and Hygiene: Min guard;Sit to/from stand        Functional mobility during ADLs: Moderate assistance  General ADL Comments:  limited by activity tolerance and weakness     Vision Baseline Vision/History: 0 No visual deficits  Vision Assessment?: No apparent visual deficits     Perception Perception  Perception Tested?: No    Praxis Praxis  Praxis tested?: Not tested      Pertinent Vitals/Pain Pain Assessment  Pain Assessment: Faces  Faces Pain Scale: Hurts a little bit  Pain Location: "everywhere"  Pain Descriptors / Indicators: Discomfort  Pain Intervention(s): Limited activity within patient's tolerance, Monitored during session     Hand Dominance  Right    Extremity/Trunk Assessment Upper Extremity Assessment  Upper Extremity Assessment: Overall WFL for tasks assessed  LUE Deficits / Details: globally 4/5. weaker than R. denies paraesthesias  LUE Sensation: WNL  LUE Coordination: WNL    Lower Extremity Assessment  Lower Extremity Assessment: Overall WFL for tasks assessed    Cervical / Trunk Assessment  Cervical / Trunk Assessment: Other exceptions  Cervical / Trunk Exceptions: forward head, rounded shoulders    Communication Communication  Communication: No difficulties    Cognition Arousal/Alertness: Awake/alert  Behavior During Therapy: WFL for tasks assessed/performed  Overall Cognitive Status: No family/caregiver present to determine baseline cognitive functioning                                  General Comments: A&Ox4, followed commands appropriately. Limited insight. Questionable historian      General Comments  VSS on [facility]      Exercises      Shoulder Instructions       Home Living Family/patient expects to be discharged to:: Private residence  Living Arrangements: Spouse/significant other;Children  Available Help at Discharge: Family;Available PRN/intermittently  Type of Home: House  Home Access: Stairs to enter  Entrance Stairs-Number of Steps: 1  Entrance Stairs-Rails: None  Home Layout: Able to live on main level with bedroom/bathroom;Two level      Bathroom Shower/Tub: Company secretary: Standard      Home Equipment: Hand held shower head    Additional Comments: Pt and wife getting divorced - per chart, pt found in hotel        Prior Functioning/Environment Prior Level of Function : Independent/Modified Independent              Mobility Comments: no AD  ADLs Comments: works full time          OT Problem List: Decreased activity tolerance;Decreased safety awareness;Decreased knowledge of use of DME or AE;Decreased knowledge of precautions;Cardiopulmonary status limiting activity      OT Treatment/Interventions: Self-care/ADL  training;Therapeutic exercise;DME and/or AE instruction;Energy conservation;Balance training;Patient/family education;Therapeutic activities     OT Goals(Current goals can be found in the care plan section) Acute Rehab OT Goals  Patient Stated Goal: home  OT Goal Formulation: With patient  Time For Goal Achievement: 01/28/23  Potential to Achieve Goals: Good  ADL Goals  Pt Will Perform Grooming: with modified independence;standing  Pt Will Perform Lower Body Dressing: with modified independence;sit to/from stand  Pt Will Transfer to Toilet: with modified independence;ambulating  Additional ADL Goal #1: Pt will demontrate increased activity tolerance by completing at least 10 minutes of standing functional tasks   OT Frequency: Min 2X/week      Co-evaluation                AM-PAC OT "6 Clicks" Daily Activity      Outcome Measure Help from another  person eating meals?: None  Help from another person taking care of personal grooming?: A Little  Help from another person toileting, which includes using toliet, bedpan, or urinal?: A Little  Help from another person bathing (including washing, rinsing, drying)?: A Lot  Help from another person to put on and taking off regular upper body clothing?: A Little  Help from another person to put on and taking off regular lower body clothing?: A Lot  6 Click Score: 17    End of Session Equipment Utilized During Treatment: Gait belt;Oxygen  Nurse Communication: Mobility status    Activity Tolerance: Patient limited by fatigue  Patient left: in chair;with call bell/phone within reach    OT Visit Diagnosis: Unsteadiness on feet (R26.81);Muscle weakness (generalized) (M62.81)     Time: 1610-9604  OT Time Calculation (min): 19 min  Charges:  OT General Charges  $OT Visit: 1 Visit  OT Evaluation  $OT Eval Moderate Complexity: 1 Mod    Derenda Mis, OTR/L  Acute Rehabilitation Services  Office [phone]  Secure Chat Communication Preferred    Donia Pounds  01/14/2023, 1:58  PM  Electronically signed by Derenda Mis D, OT at 01/14/2023  2:00 PM EDT

## 2023-01-14 NOTE — Progress Notes (Signed)
Formatting of this note might be different from the original.    Echocardiogram  2D Echocardiogram has been performed.    Maren Reamer  01/14/2023, 2:34 PM  Electronically signed by Maren Reamer, RT at 01/14/2023  2:34 PM EDT

## 2023-01-14 NOTE — Unmapped (Signed)
Formatting of this note is different from the original.  Physical Therapy Evaluation  Patient Details  Name: Lucas Hicks  MRN: [id number]  DOB: 21-Oct-1980  Today's Date: 01/14/2023    History of Present Illness   42 yo male admitted 4/5 after found unresponsive in hotel. Combative after narcan. Intubated for airway protection 4/5-4/6. Aspiration PNA with respiratory failure. PMHx: Polysubstance abuse, Hep C, IVDA, depression, ETOH, substance induced mood disorder   Clinical Impression   Pt pleasant and eager to mobilize stating he works full time, goes to the gym regularly and does not wear O2 at home. Pt with benefit of RW this session for stability but do not anticipate it will be needed at D/C. Pt with decreased activity tolerance and mobility who will benefit from acute therapy to maximize independence and safety. Encouraged OOB daily with nursing staff.     SpO2 100% NRB  HR 95      Recommendations for follow up therapy are one component of a multi-disciplinary discharge planning process, led by the attending physician.  Recommendations may be updated based on patient status, additional functional criteria and insurance authorization.    Follow Up Recommendations        Assistance Recommended at Discharge PRN   Patient can return home with the following   Assist for transportation      Equipment Recommendations None recommended by PT   Recommendations for Other Services      Functional Status Assessment Patient has had a recent decline in their functional status and demonstrates the ability to make significant improvements in function in a reasonable and predictable amount of time.       Precautions / Restrictions Precautions  Precautions: Fall  Precaution Comments: HHFNC  Restrictions  Weight Bearing Restrictions: No         Mobility   Bed Mobility                General bed mobility comments: in chair on arrival and end of session      Transfers  Overall transfer level: Needs assistance    Transfers: Sit to/from  Stand  Sit to Stand: Min guard            General transfer comment: cues for hand placement      Ambulation/Gait  Ambulation/Gait assistance: Min guard  Gait Distance (Feet): 450 Feet  Assistive device: Rolling walker (2 wheels)  Gait Pattern/deviations: Step-through pattern, Decreased stride length    Gait velocity interpretation: >2.62 ft/sec, indicative of community ambulatory    General Gait Details: pt able to ambulate long hall distance with support of RW with cues for posture and proximity to RW on NRB with SPO2 100%, RT transitioned to NC end of session at [employer] Mobility      Modified Rankin (Stroke Patients Only)          Balance Overall balance assessment: Mild deficits observed, not formally tested                                          Pertinent Vitals/Pain Pain Assessment  Pain Assessment: No/denies pain     Home Living Family/patient expects to be discharged to:: Private residence  Living Arrangements: Spouse/significant other;Children  Available Help at Discharge: Family;Available PRN/intermittently  Type of Home: House  Home Access: Stairs to enter  Entrance Stairs-Rails: None  Entrance Stairs-Number of Steps: 1    Home Layout: Able to live on main level with bedroom/bathroom;Two level  Home Equipment: Hand held shower head  Additional Comments: Pt and wife getting divorced - per chart, pt found in hotel     Prior Function Prior Level of Function : Independent/Modified Independent              Mobility Comments: no AD  ADLs Comments: works full time      Higher education careers adviser    Dominant Hand: Right      Extremity/Trunk Assessment    Upper Extremity Assessment  Upper Extremity Assessment: Overall WFL for tasks assessed  LUE Deficits / Details: globally 4/5. weaker than R. denies paraesthesias  LUE Sensation: WNL  LUE Coordination: WNL      Lower Extremity Assessment  Lower Extremity Assessment: Overall WFL for tasks assessed      Cervical / Trunk Assessment  Cervical / Trunk  Assessment: Other exceptions  Cervical / Trunk Exceptions: forward head, rounded shoulders   Communication    Communication: No difficulties   Cognition Arousal/Alertness: Awake/alert  Behavior During Therapy: WFL for tasks assessed/performed  Overall Cognitive Status: Impaired/Different from baseline  Area of Impairment: Memory                                          General Comments General comments (skin integrity, edema, etc.): VSS on HHFNC      Exercises       Assessment/Plan     PT Assessment Patient needs continued PT services   PT Problem List Decreased mobility;Decreased activity tolerance;Decreased balance;Decreased knowledge of use of DME        PT Treatment Interventions Gait training;Therapeutic exercise;Patient/family education;Stair training;Balance training;Functional mobility training;DME instruction;Therapeutic activities      PT Goals (Current goals can be found in the Care Plan section)   Acute Rehab PT Goals  Patient Stated Goal: return to home and work  PT Goal Formulation: With patient  Time For Goal Achievement: 01/21/23  Potential to Achieve Goals: Good      Frequency Min 3X/week      Co-evaluation                AM-PAC PT "6 Clicks" Mobility   Outcome Measure Help needed turning from your back to your side while in a flat bed without using bedrails?: None  Help needed moving from lying on your back to sitting on the side of a flat bed without using bedrails?: A Little  Help needed moving to and from a bed to a chair (including a wheelchair)?: A Little  Help needed standing up from a chair using your arms (e.g., wheelchair or bedside chair)?: A Little  Help needed to walk in hospital room?: A Little  Help needed climbing 3-5 steps with a railing? : A Little  6 Click Score: 19      End of Session Equipment Utilized During Treatment: Oxygen  Activity Tolerance: Patient tolerated treatment well  Patient left: in chair;with call bell/phone within reach  Nurse Communication: Mobility status  PT  Visit Diagnosis: Other abnormalities of gait and mobility (R26.89)      Time: 1610-9604  PT Time Calculation (min) (ACUTE ONLY): 22 min    Charges:   PT Evaluation  $PT Eval Moderate Complexity: 1 Mod  Merryl Hacker, PT  Acute Rehabilitation Services  Office: [phone]    Cristine Polio  01/14/2023, 12:41 PM    Electronically signed by Cristine Polio, PT at 01/14/2023 12:44 PM EDT

## 2023-01-15 ENCOUNTER — Inpatient Hospital Stay (HOSPITAL_COMMUNITY): Payer: Medicaid Other

## 2023-01-15 DIAGNOSIS — G929 Unspecified toxic encephalopathy: Secondary | ICD-10-CM | POA: Diagnosis not present

## 2023-01-15 LAB — COMPREHENSIVE METABOLIC PANEL
ALT: 296 U/L — ABNORMAL HIGH (ref 0–44)
AST: 85 U/L — ABNORMAL HIGH (ref 15–41)
Albumin: 2.5 g/dL — ABNORMAL LOW (ref 3.5–5.0)
Alkaline Phosphatase: 94 U/L (ref 38–126)
Anion gap: 8 (ref 5–15)
BUN: 20 mg/dL (ref 6–20)
CO2: 27 mmol/L (ref 22–32)
Calcium: 8.4 mg/dL — ABNORMAL LOW (ref 8.9–10.3)
Chloride: 102 mmol/L (ref 98–111)
Creatinine, Ser: 1.2 mg/dL (ref 0.61–1.24)
GFR, Estimated: >60 mL/min (ref >60)
Glucose, Bld: 118 mg/dL — ABNORMAL HIGH (ref 70–99)
Potassium: 3.3 mmol/L — ABNORMAL LOW (ref 3.5–5.1)
Sodium: 137 mmol/L (ref 135–145)
Total Bilirubin: 0.9 mg/dL (ref 0.3–1.2)
Total Protein: 6.4 g/dL — ABNORMAL LOW (ref 6.5–8.1)

## 2023-01-15 LAB — CBC
HCT: 38.2 % — ABNORMAL LOW (ref 39.0–52.0)
Hemoglobin: 13 g/dL (ref 13.0–17.0)
MCH: 30.1 pg (ref 26.0–34.0)
MCHC: 34 g/dL (ref 30.0–36.0)
MCV: 88.4 fL (ref 80.0–100.0)
Platelets: 216 10*3/uL (ref 150–400)
RBC: 4.32 MIL/uL (ref 4.22–5.81)
RDW: 15.6 % — ABNORMAL HIGH (ref 11.5–15.5)
WBC: 16.7 10*3/uL — ABNORMAL HIGH (ref 4.0–10.5)
nRBC: 0 % (ref 0.0–0.2)

## 2023-01-15 LAB — PHOSPHORUS: Phosphorus: 3.8 mg/dL (ref 2.5–4.6)

## 2023-01-15 LAB — GLUCOSE, CAPILLARY
Glucose-Capillary: 107 mg/dL — ABNORMAL HIGH (ref 70–99)
Glucose-Capillary: 113 mg/dL — ABNORMAL HIGH (ref 70–99)

## 2023-01-15 LAB — CULTURE, BLOOD (ROUTINE X 2)

## 2023-01-15 LAB — PROCALCITONIN: Procalcitonin: 1.64 ng/mL

## 2023-01-15 LAB — BRAIN NATRIURETIC PEPTIDE: B Natriuretic Peptide: 21.1 pg/mL (ref 0.0–100.0)

## 2023-01-15 LAB — C-REACTIVE PROTEIN: CRP: 19.9 mg/dL — ABNORMAL HIGH (ref <1.0)

## 2023-01-15 LAB — MAGNESIUM: Magnesium: 2.2 mg/dL (ref 1.7–2.4)

## 2023-01-15 LAB — LIPASE, BLOOD: Lipase: 88 U/L — ABNORMAL HIGH (ref 11–51)

## 2023-01-15 LAB — MRSA NEXT GEN BY PCR, NASAL: MRSA by PCR Next Gen: NOT DETECTED

## 2023-01-15 MED ORDER — POTASSIUM CHLORIDE CRYS ER 20 MEQ PO TBCR
40.0000 meq | EXTENDED_RELEASE_TABLET | Freq: Once | ORAL | Status: AC
  Administered 2023-01-15: 40 meq via ORAL
  Filled 2023-01-15: qty 2

## 2023-01-15 MED ORDER — LEVOFLOXACIN 750 MG PO TABS
750.0000 mg | ORAL_TABLET | Freq: Every day | ORAL | Status: AC
  Administered 2023-01-15 – 2023-01-18 (×4): 750 mg via ORAL
  Filled 2023-01-15 (×4): qty 1

## 2023-01-15 NOTE — Progress Notes (Signed)
Patient continues to improve now on reg [place].  Available PRN, recs per latest note.  Myrla Halsted MD PCCM

## 2023-01-15 NOTE — Progress Notes (Addendum)
PROGRESS NOTE                                                                                                                                                                                                             Patient Demographics:    Randall Parsons, is a 42 y.o. male, DOB - [birth year], KGM:[id number]  Outpatient Primary MD for the patient is [facility]    LOS - 5  Admit date - 01/10/2023    CC - SOB      Brief Narrative (HPI from H&P)   42 year old male patient with history Polysubstance abuse, Hep C, IVDA, depression, ETOH, substance induced mood disorder, he was found unresponsive at the hotel and was brought to the ER unresponsive apneic with pinpoint pupils presumed to have drug overdose, he was found to have pneumonia along with acute hypoxic respiratory failure he was intubated admitted to ICU, stabilized transferred to hospitalist service on 01/15/2023 on day 5 of his hospital stay.   Subjective:    Randall Parsons today has, No headache, No chest pain, No abdominal pain - No Nausea, No new weakness tingling or numbness, improved cough & SOB.   Assessment  & Plan :   Acute hypoxic respiratory failure secondary to aspiration pneumonia, pulmonary edema, NSTEMI, in the setting of acute toxic encephalopathy caused by recreational drug overdose, history of IVDA in the past. He was admitted to the ICU intubated for 24 hours, started on antibiotics, overall improving, currently on nasal cannula oxygen still completing his antibiotic treatment, taper oral steroids, counseled to abstain from all drug use.  Advance activity, encouraged to sit up in chair use I-S and flutter valve for pulmonary toiletry, continue to monitor.  History of positive hep C.  Outpatient follow-up with GI and ID.  Elevated troponin, likely demand ischemia caused by hypoxia from #1 above along with cocaine use.  EKG and echo stable with  preserved EF and no wall motion abnormality, chest pain-free, continue to monitor.  Consult to quit doing cocaine and benzodiazepine abuse.  Transaminitis.  Likely due to shock liver caused by hypoxic respiratory failure hypotension and anoxia, trend is improving.   Addendum paged by nurse at 5 PM that patient is complaining of some pain.  I interviewed the patient and he said he has been having pain for the last  2 to 3 days but forgot to tell me this morning and forgot to tell other physicians as well previously, on exam soft abd, will obtain KUB, lipase, he had a BM twice today already, tramadol and Tylenol ordered.       Condition - Fair  Family Communication  :  None  Code Status :  Full  Consults  :  PCCM  PUD Prophylaxis :    Procedures  :     TTE -  1. Left ventricular ejection fraction, by estimation, is 60 to 65%. The left ventricle has normal function. The left ventricle has no regional wall motion abnormalities. Left ventricular diastolic parameters were normal.  2. Right ventricular systolic function is normal. The right ventricular size is normal.  3. The mitral valve is normal in structure. No evidence of mitral valve regurgitation. No evidence of mitral stenosis.  4. The aortic valve is normal in structure. Aortic valve regurgitation is not visualized. No aortic stenosis is present.  5. The inferior vena cava is normal in size with greater than 50% respiratory variability, suggesting right atrial pressure of 3 mmHg.      Disposition Plan  :    Status is: Inpatient  DVT Prophylaxis  :    heparin injection 5,000 Units Start: 01/11/23 1400 SCDs Start: 01/10/23 1250    Lab Results  Component Value Date   PLT 174 01/14/2023    Diet :  Diet Order             Diet regular Room service appropriate? Yes; Fluid consistency: Thin  Diet effective now                    Inpatient Medications  Scheduled Meds:  alteplase  2 mg Intracatheter Once    arformoterol  15 mcg Nebulization BID   budesonide (PULMICORT) nebulizer solution  0.25 mg Nebulization BID   calcium carbonate  1 tablet Oral BID   Chlorhexidine Gluconate Cloth  6 each Topical Daily   heparin injection (subcutaneous)  5,000 Units Subcutaneous Q8H   lamoTRIgine  100 mg Oral BID   mouth rinse  15 mL Mouth Rinse 4 times per day   predniSONE  40 mg Oral Q breakfast   senna  1 tablet Oral QHS   sodium chloride flush  10-40 mL Intracatheter Q12H   Continuous Infusions:  sodium chloride Stopped (01/10/23 2038)   levofloxacin (LEVAQUIN) IV Stopped (01/14/23 1056)   PRN Meds:.acetaminophen, chlorpheniramine-HYDROcodone, clonazePAM, docusate, guaiFENesin, haloperidol lactate, melatonin, ondansetron (ZOFRAN) IV, mouth rinse, polyethylene glycol, polyvinyl alcohol, sodium chloride flush, traMADol     Objective:   Vitals:   01/14/23 1606 01/15/23 0030 01/15/23 0500 01/15/23 0548  BP: 133/85 115/74  115/65  Pulse: 87 85  85  Resp: (!) 21 20  20   Temp: 98.8 F (37.1 C) 98.8 F (37.1 C)  97.7 F (36.5 C)  TempSrc: Oral Oral  Oral  SpO2: 96%     Weight:   82.5 kg   Height:        Wt Readings from Last 3 Encounters:  01/15/23 82.5 kg  12/11/21 74.8 kg  09/19/21 89.8 kg     Intake/Output Summary (Last 24 hours) at 01/15/2023 0752 Last data filed at 01/15/2023 0500 Gross per 24 hour  Intake 650.07 ml  Output 3775 ml  Net -3124.93 ml     Physical Exam  Awake Alert, No new F.N deficits, Normal affect [place].AT,PERRAL Supple Neck, No JVD,   Symmetrical Chest  wall movement, Good air movement bilaterally, few coarse B sounds, RRR,No Gallops,Rubs or new Murmurs,  +ve B.Sounds, Abd Soft, No tenderness,   No Cyanosis, Clubbing or edema     Data Review:    Recent Labs  Lab 01/10/23 1138 01/10/23 1302 01/11/23 0138 01/11/23 0358 01/11/23 2152 01/12/23 0116 01/13/23 0211 01/14/23 0500  WBC 2.7*  --  9.2  --   --  13.6* 18.8* 12.8*  HGB 14.7   < > 13.0 12.6*  11.6* 11.5* 12.7* 11.9*  HCT 46.6   < > 38.4* 37.0* 34.0* 35.2* 39.4 35.3*  PLT 277  --  187  --   --  188 171 174  MCV 93.8  --  88.9  --   --  89.6 90.8 88.3  MCH 29.6  --  30.1  --   --  29.3 29.3 29.8  MCHC 31.5  --  33.9  --   --  32.7 32.2 33.7  RDW 14.3  --  14.6  --   --  14.6 14.7 15.1  LYMPHSABS 0.6*  --   --   --   --   --   --  1.5  MONOABS 0.3  --   --   --   --   --   --  1.3*  EOSABS 0.0  --   --   --   --   --   --  0.0  BASOSABS 0.0  --   --   --   --   --   --  0.0   < > = values in this interval not displayed.    Recent Labs  Lab 01/10/23 1138 01/10/23 1139 01/10/23 1140 01/10/23 1302 01/10/23 1709 01/10/23 1711 01/11/23 0138 01/11/23 0358 01/11/23 2152 01/12/23 0116 01/12/23 0922 01/13/23 0211 01/14/23 0500  NA 140  --   --    < > 139  --  134* 137 140 139  --  139 139  K 4.1  --   --    < > 4.2  --  4.8 4.5 4.1 4.3  --  3.4* 3.3*  CL 99  --   --    < > 100  --  102  --   --  102  --  104 105  CO2 21*  --   --   --  23  --  21*  --   --  26  --  26 24  ANIONGAP 20*  --   --   --  16*  --  11  --   --  11  --  9 10  GLUCOSE 103*  --   --    < > 109*  --  104*  --   --  83  --  100* 101*  BUN 37*  --   --    < > 34*  --  35*  --   --  24*  --  17 19  CREATININE 3.86*  --   --    < > 3.08*  --  2.20*  --   --  1.31*  --  0.98 1.04  AST 2,316*  --   --   --   --   --   --   --   --   --   --  272* 151*  ALT 2,154*  --   --   --   --   --   --   --   --   --   --  732* 393*  ALKPHOS 73  --   --   --   --   --   --   --   --   --   --  89 95  BILITOT 0.7  --   --   --   --   --   --   --   --   --   --  1.3* 0.8  ALBUMIN 3.6  --   --   --   --   --   --   --   --   --   --  2.5* 2.2*  PROCALCITON  --   --   --   --  20.10  --   --   --   --   --   --   --   --   LATICACIDVEN  --   --  5.6*  --   --  4.5*  --   --   --   --  1.8  --   --   INR 1.3*  --   --   --   --   --   --   --   --   --   --   --   --   BNP  --  141.3*  --   --   --   --   --   --   --    --   --   --   --   MG  --   --   --   --   --   --  1.7  --   --  2.7*  --  2.1  --   CALCIUM 8.0*  --   --   --  7.4*  --  7.6*  --   --  8.6*  --  8.2* 8.1*   < > = values in this interval not displayed.      Recent Labs  Lab 01/10/23 1138 01/10/23 1139 01/10/23 1140 01/10/23 1709 01/10/23 1711 01/11/23 0138 01/12/23 0116 01/12/23 0922 01/13/23 0211 01/14/23 0500  PROCALCITON  --   --   --  20.10  --   --   --   --   --   --   LATICACIDVEN  --   --  5.6*  --  4.5*  --   --  1.8  --   --   INR 1.3*  --   --   --   --   --   --   --   --   --   BNP  --  141.3*  --   --   --   --   --   --   --   --   MG  --   --   --   --   --  1.7 2.7*  --  2.1  --   CALCIUM 8.0*  --   --  7.4*  --  7.6* 8.6*  --  8.2* 8.1*   ------------------------------------------------------------------------------------------------------------------ Lab Results  Component Value Date   CHOL 175 12/17/2021   HDL 50 12/17/2021   LDLCALC 100 (H) 12/17/2021   TRIG 127 12/17/2021   CHOLHDL 3.5 12/17/2021    Lab Results  Component Value Date   HGBA1C 5.9 (H) 12/17/2021    No results for input(s): "TSH", "T4TOTAL", "T3FREE", "THYROIDAB" in the last 72 hours.  Invalid input(s): "FREET3" ------------------------------------------------------------------------------------------------------------------ Cardiac Enzymes No  results for input(s): "CKMB", "TROPONINI", "MYOGLOBIN" in the last 168 hours.  Invalid input(s): "CK"  Micro Results Recent Results (from the past 240 hour(s))  Culture, blood (routine x 2)     Status: None   Collection Time: 01/10/23 11:39 AM   Specimen: BLOOD RIGHT WRIST  Result Value Ref Range Status   Specimen Description BLOOD RIGHT WRIST  Final   Special Requests   Final    BOTTLES DRAWN AEROBIC AND ANAEROBIC Blood Culture adequate volume   Culture   Final    NO GROWTH 5 DAYS Performed at [facility], [address], [place], [place] [address]    Report Status  01/15/2023 FINAL  Final  Culture, blood (routine x 2)     Status: None   Collection Time: 01/10/23  5:09 PM   Specimen: BLOOD LEFT HAND  Result Value Ref Range Status   Specimen Description BLOOD LEFT HAND  Final   Special Requests   Final    BOTTLES DRAWN AEROBIC AND ANAEROBIC Blood Culture adequate volume   Culture   Final    NO GROWTH 5 DAYS Performed at [facility], [address], [place], [place] [address]    Report Status 01/15/2023 FINAL  Final  Culture, blood (Routine X 2) w Reflex to ID Panel     Status: None   Collection Time: 01/10/23  7:15 PM   Specimen: BLOOD LEFT HAND  Result Value Ref Range Status   Specimen Description BLOOD LEFT HAND  Final   Special Requests   Final    BOTTLES DRAWN AEROBIC AND ANAEROBIC Blood Culture adequate volume   Culture   Final    NO GROWTH 5 DAYS Performed at [facility], [address], [place], [place] [address]    Report Status 01/15/2023 FINAL  Final  MRSA Next Gen by PCR, Nasal     Status: None   Collection Time: 01/10/23  9:16 PM   Specimen: Nasal Mucosa; Nasal Swab  Result Value Ref Range Status   MRSA by PCR Next Gen NOT DETECTED NOT DETECTED Final    Comment: (NOTE) The GeneXpert MRSA Assay (FDA approved for NASAL specimens only), is one component of a comprehensive MRSA colonization surveillance program. It is not intended to diagnose MRSA infection nor to guide or monitor treatment for MRSA infections. Test performance is not FDA approved in patients less than 59 years old. Performed at [facility], [address] [place], [place] [address]   Expectorated Sputum Assessment w Gram Stain, Rflx to Resp Cult     Status: None   Collection Time: 01/13/23  7:10 PM   Specimen: Sputum  Result Value Ref Range Status   Specimen Description SPUTUM  Final   Special Requests  EXPECTORATED  Final   Sputum evaluation   Final    THIS SPECIMEN IS ACCEPTABLE FOR SPUTUM CULTURE Performed at [facility]  Lab, [address] [place], [place] [address]    Report Status 01/13/2023 FINAL  Final  Culture, Respiratory w Gram Stain     Status: None (Preliminary result)   Collection Time: 01/13/23  7:10 PM   Specimen: SPU  Result Value Ref Range Status   Specimen Description SPUTUM  Final   Special Requests  EXPECTORATED Reflexed from [id number]  Final   Gram Stain   Final    FEW WBC PRESENT, PREDOMINANTLY PMN RARE GRAM POSITIVE COCCI    Culture   Final    TOO  YOUNG TO READ Performed at [facility], [address], [place], [place] [address]    Report Status PENDING  Incomplete    Radiology Reports DG Chest Port 1 View  Result Date: 01/15/2023 CLINICAL DATA:  Cough. EXAM: PORTABLE CHEST 1 VIEW COMPARISON:  Yesterday FINDINGS: Mild improvement in bilateral airspace disease which is more extensive on the right. Right PICC with tip at the upper cavoatrial junction. Stable heart size and mediastinal contours. No effusion or pneumothorax. IMPRESSION: Multifocal pneumonia with mildly improved aeration. Electronically Signed   By: Tiburcio Pea M.D.   On: 01/15/2023 06:19   ECHOCARDIOGRAM COMPLETE  Result Date: 01/14/2023    ECHOCARDIOGRAM REPORT   Patient Name:   Randall Parsons Date of Exam: 01/14/2023 Medical Rec #:  [id number]    Height:       69.0 in Accession #:    [id number]   Weight:       182.5 lb Date of Birth:  1981-06-16    BSA:          1.987 m Patient Age:    41 years     BP:           123/97 mmHg Patient Gender: M            HR:           92 bpm. Exam Location:  Inpatient Procedure: 2D Echo, Color Doppler, Cardiac Doppler and Intracardiac            Opacification Agent Indications:    NSTEMI I21.4  History:        Patient has no prior history of Echocardiogram examinations.                 NSTEMI, Substance Abuse; Risk Factors:Current Smoker.  Sonographer:    Aron Baba Referring Phys: [id number] Lorin Glass  Sonographer Comments: Suboptimal parasternal window. Image acquisition challenging  due to respiratory motion. IMPRESSIONS  1. Left ventricular ejection fraction, by estimation, is 60 to 65%. The left ventricle has normal function. The left ventricle has no regional wall motion abnormalities. Left ventricular diastolic parameters were normal.  2. Right ventricular systolic function is normal. The right ventricular size is normal.  3. The mitral valve is normal in structure. No evidence of mitral valve regurgitation. No evidence of mitral stenosis.  4. The aortic valve is normal in structure. Aortic valve regurgitation is not visualized. No aortic stenosis is present.  5. The inferior vena cava is normal in size with greater than 50% respiratory variability, suggesting right atrial pressure of 3 mmHg. FINDINGS  Left Ventricle: Left ventricular ejection fraction, by estimation, is 60 to 65%. The left ventricle has normal function. The left ventricle has no regional wall motion abnormalities. Definity contrast agent was given IV to delineate the left ventricular  endocardial borders. The left ventricular internal cavity size was normal in size. There is no left ventricular hypertrophy. Left ventricular diastolic parameters were normal. Right Ventricle: The right ventricular size is normal. No increase in right ventricular wall thickness. Right ventricular systolic function is normal. Left Atrium: Left atrial size was normal in size. Right Atrium: Right atrial size was normal in size. Pericardium: There is no evidence of pericardial effusion. Mitral Valve: The mitral valve is normal in structure. No evidence of mitral valve regurgitation. No evidence of mitral valve stenosis. Tricuspid Valve: The tricuspid valve is normal in structure. Tricuspid valve regurgitation is not demonstrated. No evidence of tricuspid stenosis. Aortic Valve: The aortic  valve is normal in structure. Aortic valve regurgitation is not visualized. No aortic stenosis is present. Pulmonic Valve: The pulmonic valve was normal in  structure. Pulmonic valve regurgitation is not visualized. No evidence of pulmonic stenosis. Aorta: The aortic root is normal in size and structure. Venous: The inferior vena cava is normal in size with greater than 50% respiratory variability, suggesting right atrial pressure of 3 mmHg. IAS/Shunts: No atrial level shunt detected by color flow Doppler.  LEFT VENTRICLE PLAX 2D LVIDd:         5.20 cm   Diastology LVIDs:         3.40 cm   LV e' medial:    10.10 cm/s LV PW:         1.00 cm   LV E/e' medial:  8.9 LV IVS:        1.00 cm   LV e' lateral:   15.00 cm/s LVOT diam:     1.90 cm   LV E/e' lateral: 6.0 LV SV:         62 LV SV Index:   31 LVOT Area:     2.84 cm  RIGHT VENTRICLE RV S prime:     13.10 cm/s TAPSE (M-mode): 1.6 cm LEFT ATRIUM           Index        RIGHT ATRIUM           Index LA diam:      3.20 cm 1.61 cm/m   RA Area:     14.90 cm LA Vol (A4C): 27.9 ml 14.04 ml/m  RA Volume:   41.70 ml  20.98 ml/m  AORTIC VALVE LVOT Vmax:   136.00 cm/s LVOT Vmean:  89.600 cm/s LVOT VTI:    0.218 m  AORTA Ao Root diam: 3.20 cm Ao Asc diam:  3.80 cm MITRAL VALVE MV Area (PHT): 3.93 cm    SHUNTS MV Decel Time: 193 msec    Systemic VTI:  0.22 m MV E velocity: 90.10 cm/s  Systemic Diam: 1.90 cm MV A velocity: 59.70 cm/s MV E/A ratio:  1.51 Charlton Haws MD Electronically signed by Charlton Haws MD Signature Date/Time: 01/14/2023/2:53:08 PM    Final    DG CHEST PORT 1 VIEW  Result Date: 01/14/2023 CLINICAL DATA:  Respiratory failure EXAM: PORTABLE CHEST 1 VIEW COMPARISON:  CXR 01/12/23 FINDINGS: Right arm PICC with the tip at the cavoatrial junction. There are persistent bilateral patchy airspace opacities, not significantly changed from prior exam, that remain worrisome for infection. No pleural effusion. No pneumothorax. No radiographically apparent displaced rib fractures. Visualized upper abdomen is unremarkable. IMPRESSION: No significant change in bilateral patchy airspace opacities, which remain worrisome for  infection. Improved bilateral pleural effusions. Electronically Signed   By: Lorenza Cambridge M.D.   On: 01/14/2023 07:53   DG Chest Port 1 View  Result Date: 01/12/2023 CLINICAL DATA:  Pneumonia. EXAM: PORTABLE CHEST 1 VIEW COMPARISON:  Radiograph yesterday. CT 01/10/2023 FINDINGS: Significant patient rotation on the current exam, lung volumes are low. Tip of the right upper extremity PICC at the atrial caval junction. Heterogeneous bilateral lung opacities with equivocal worsening from yesterday. Small bilateral pleural effusions. No pneumothorax. Stable heart size and mediastinal contours allowing for rotation and low lung volumes. IMPRESSION: 1. Equivocal worsening of bilateral lung opacities since yesterday. 2. Small bilateral pleural effusions. 3. Stable right upper extremity PICC. Electronically Signed   By: Narda Rutherford M.D.   On: 01/12/2023 11:33   DG CHEST  PORT 1 VIEW  Result Date: 01/11/2023 CLINICAL DATA:  Hypoxia EXAM: PORTABLE CHEST 1 VIEW COMPARISON:  01/11/2023 FINDINGS: Interval placement of a right sided PICC is seen at the cavoatrial junction. Cardiac shadow is enlarged accentuated by the portable technique. Increasing central vascular congestion is noted. Multifocal airspace opacities are noted slightly increased from the prior study. This is likely related to a poor inspiratory effort. Some superimposed edema could not be totally excluded. IMPRESSION: Patchy airspace opacities bilaterally somewhat increased from the prior exam likely related to a poor inspiratory effort. New right-sided PICC is seen in satisfactory position. Electronically Signed   By: Alcide CleverMark  Lukens M.D.   On: 01/11/2023 21:03      Signature  -   Susa RaringPrashant Irelynn Schermerhorn M.D on 01/15/2023 at 7:52 AM   -  To page go to [email or web address]

## 2023-01-15 NOTE — Progress Notes (Signed)
Mobility Specialist - Progress Note   01/15/23 1458  Mobility  Activity Ambulated with assistance in hallway  Level of Assistance Standby assist, set-up cues, supervision of patient - no hands on  Assistive Device Front wheel walker  Distance Ambulated (ft) 230 ft  Activity Response Tolerated well  Mobility Referral Yes  $Mobility charge 1 Mobility   Pt was received in chair and agreeable to mobility. Pt required x2 standing rest breaks d/t SOB. Pt was able to ambulate on 5L/min. SpO2 ranging between 83%-93%, however pleth was unreliable. Pt was returned to EOB with all needs met.   Alda Lea  Mobility Specialist Please contact via Special educational needs teacher or Rehab office at [phone]

## 2023-01-15 NOTE — Progress Notes (Signed)
Physical Therapy Treatment Patient Details Name: Randall Parsons MRN: [id number] DOB: [birth year] Today's Date: 01/15/2023   History of Present Illness 42 yo male admitted 4/5 after found unresponsive in hotel. Combative after narcan. Intubated for airway protection 4/5-4/6. Aspiration PNA with respiratory failure. PMHx: Polysubstance abuse, Hep C, IVDA, depression, ETOH, substance induced mood disorder    PT Comments    Pt greeted supine in bed and agreeable to session with continued progress towards acute goals. Pt continues to be limited by decreased activity tolerance, fatigue, and decreased cardiopulmonary endurance, however pt motivated to return to PLOF. Pt able to demonstrate gait for hallway distance with min guard and RW support with seated rest needed due to fatigue. Pt able to complete short bout of gait without AD with  min A to steady as pt with increased instability without RW use. Pt needing cues throughout session for upright posture and breathing techniques as pt with tendency for anterior trunk flexion with increased fatigue. Educated pt on importance of continued and frequent mobility and importance of time up OOB with pt verbalizing understanding.  Pt continues to benefit from skilled PT services to progress toward functional mobility goals.    Recommendations for follow up therapy are one component of a multi-disciplinary discharge planning process, led by the attending physician.  Recommendations may be updated based on patient status, additional functional criteria and insurance authorization.  Follow Up Recommendations       Assistance Recommended at Discharge PRN  Patient can return home with the following Assist for transportation   Equipment Recommendations  None recommended by PT    Recommendations for Other Services       Precautions / Restrictions Precautions Precautions: Fall Precaution Comments: [facility] Restrictions Weight Bearing Restrictions: No      Mobility  Bed Mobility Overal bed mobility: Needs Assistance Bed Mobility: Supine to Sit, Sit to Supine     Supine to sit: Min assist Sit to supine: Min guard   General bed mobility comments: pt reaching for this PTA to elevate trunk needing min A to complete, min guard to return to supine    Transfers Overall transfer level: Needs assistance Equipment used: Rolling walker (2 wheels), None Transfers: Sit to/from Stand Sit to Stand: Min guard           General transfer comment: cues for hand placement able to complete x2 from EOB and x1 from standard chair    Ambulation/Gait Ambulation/Gait assistance: Min guard, Min assist Gait Distance (Feet): 200 Feet (x2 + 40) Assistive device: Rolling walker (2 wheels), None Gait Pattern/deviations: Step-through pattern, Decreased stride length Gait velocity: decr     General Gait Details: slow fairly steady gait with RW support, cues for posture and proximity to RW, SpO2 >88% on 5L , improving to >90% with cues for breathing techniques, x1 seated rest break due to fatigue, able to ocmpelte short bout without AD and min A to steady, increased postural sway and drift R/L without AD use   Stairs             Wheelchair Mobility    Modified Rankin (Stroke Patients Only)       Balance Overall balance assessment: Mild deficits observed, not formally tested Sitting-balance support: Feet supported Sitting balance-Leahy Scale: Poor Sitting balance - Comments: unable to sustain unsupported sitting for >2 minutes   Standing balance support: Bilateral upper extremity supported, During functional activity Standing balance-Leahy Scale: Poor Standing balance comment: prefers BUE supported  Cognition Arousal/Alertness: Awake/alert Behavior During Therapy: WFL for tasks assessed/performed Overall Cognitive Status: Impaired/Different from baseline Area of Impairment: Memory                                General Comments: A&Ox4, followed commands appropriately. Limited insight.        Exercises      General Comments General comments (skin integrity, edema, etc.): VSS on supplemental O2      Pertinent Vitals/Pain Pain Assessment Pain Assessment: Faces Faces Pain Scale: Hurts little more Pain Location: L shoulder with movement Pain Descriptors / Indicators: Discomfort, Sore Pain Intervention(s): Monitored during session, Limited activity within patient's tolerance, Patient requesting pain meds-RN notified    Home Living                          Prior Function            PT Goals (current goals can now be found in the care plan section) Acute Rehab PT Goals PT Goal Formulation: With patient Time For Goal Achievement: 01/21/23 Progress towards PT goals: Progressing toward goals    Frequency    Min 3X/week      PT Plan      Co-evaluation              AM-PAC PT "6 Clicks" Mobility   Outcome Measure  Help needed turning from your back to your side while in a flat bed without using bedrails?: None Help needed moving from lying on your back to sitting on the side of a flat bed without using bedrails?: A Little Help needed moving to and from a bed to a chair (including a wheelchair)?: A Little Help needed standing up from a chair using your arms (e.g., wheelchair or bedside chair)?: A Little Help needed to walk in hospital room?: A Little Help needed climbing 3-5 steps with a railing? : A Little 6 Click Score: 19    End of Session Equipment Utilized During Treatment: Oxygen;Gait belt Activity Tolerance: Patient tolerated treatment well Patient left: with call bell/phone within reach;in bed;with family/visitor present Nurse Communication: Mobility status;Patient requests pain meds PT Visit Diagnosis: Other abnormalities of gait and mobility (R26.89)     Time: 4469-5072 PT Time Calculation (min) (ACUTE ONLY): 21  min  Charges:  $Gait Training: 8-22 mins                     Terrel Manalo R. PTA Acute Rehabilitation Services Office: [phone]    Catalina Antigua 01/15/2023, 10:00 AM

## 2023-01-15 NOTE — Progress Notes (Signed)
Formatting of this note is different from the original.  Images from the original note were not included.                                      PROGRESS NOTE                                                     Patient Demographics:      Lucas Hicks, is a 42 y.o. male, DOB - 03/20/1981, ZOX:[id number]    Outpatient Primary MD for the patient is [facility]    LOS - 5  Admit date - 01/10/2023      CC - SOB      Brief Narrative (HPI from H&P)   42 year old male patient with history Polysubstance abuse, Hep C, IVDA, depression, ETOH, substance induced mood disorder, he was found unresponsive at the hotel and was brought to the ER unresponsive apneic with pinpoint pupils presumed to have drug overdose, he was found to have pneumonia along with acute hypoxic respiratory failure he was intubated admitted to ICU, stabilized transferred to hospitalist service on 01/15/2023 on day 5 of his hospital stay.     Subjective:      Lucas Hicks today has, No headache, No chest pain, No abdominal pain - No Nausea, No new weakness tingling or numbness, improved cough & SOB.     Assessment  & Plan :     Acute hypoxic respiratory failure secondary to aspiration pneumonia, pulmonary edema, NSTEMI, in the setting of acute toxic encephalopathy caused by recreational drug overdose, history of IVDA in the past. He was admitted to the ICU intubated for 24 hours, started on antibiotics, overall improving, currently on nasal cannula oxygen still completing his antibiotic treatment, taper oral steroids, counseled to abstain from all drug use.  Advance activity, encouraged to sit up in chair use I-S and flutter valve for pulmonary toiletry, continue to monitor.    History of positive hep C.  Outpatient follow-up with GI and ID.    Elevated troponin, likely demand ischemia caused by hypoxia from #1 above along with cocaine use.  EKG and echo stable with preserved EF and no wall motion abnormality, chest pain-free, continue to monitor.   Consult to quit doing cocaine and benzodiazepine abuse.    Transaminitis.  Likely due to shock liver caused by hypoxic respiratory failure hypotension and anoxia, trend is improving.    Addendum paged by nurse at 5 PM that patient is complaining of some pain.  I interviewed the patient and he said he has been having pain for the last 2 to 3 days but forgot to tell me this morning and forgot to tell other physicians as well previously, on exam soft abd, will obtain KUB, lipase, he had a BM twice today already, tramadol and Tylenol ordered.        Condition - Fair    Family Communication  :  None    Code Status :  Full    Consults  :  PCCM    PUD Prophylaxis :      Procedures  :       TTE -  1. Left ventricular ejection fraction, by estimation, is 60 to 65%. The left  ventricle has normal function. The left ventricle has no regional wall motion abnormalities. Left ventricular diastolic parameters were normal.  2. Right ventricular systolic function is normal. The right ventricular size is normal.  3. The mitral valve is normal in structure. No evidence of mitral valve regurgitation. No evidence of mitral stenosis.  4. The aortic valve is normal in structure. Aortic valve regurgitation is not visualized. No aortic stenosis is present.  5. The inferior vena cava is normal in size with greater than 50% respiratory variability, suggesting right atrial pressure of 3 mmHg.        Disposition Plan  :      Status is: Inpatient    DVT Prophylaxis  :      heparin injection 5,000 Units Start: 01/11/23 1400  SCDs Start: 01/10/23 1250      Lab Results   Component Value Date    PLT 174 01/14/2023     Diet :   Diet Order          Diet regular Room service appropriate? Yes; Fluid consistency: Thin  Diet effective now                        Inpatient Medications    Scheduled Meds:   alteplase  2 mg Intracatheter Once    arformoterol  15 mcg Nebulization BID    budesonide (PULMICORT) nebulizer solution  0.25 mg Nebulization BID     calcium carbonate  1 tablet Oral BID    Chlorhexidine Gluconate Cloth  6 each Topical Daily    heparin injection (subcutaneous)  5,000 Units Subcutaneous Q8H    lamoTRIgine  100 mg Oral BID    mouth rinse  15 mL Mouth Rinse 4 times per day    predniSONE  40 mg Oral Q breakfast    senna  1 tablet Oral QHS    sodium chloride flush  10-40 mL Intracatheter Q12H     Continuous Infusions:   sodium chloride Stopped (01/10/23 2038)    levofloxacin (LEVAQUIN) IV Stopped (01/14/23 1056)     PRN Meds:.acetaminophen, chlorpheniramine-HYDROcodone, clonazePAM, docusate, guaiFENesin, haloperidol lactate, melatonin, ondansetron (ZOFRAN) IV, mouth rinse, polyethylene glycol, polyvinyl alcohol, sodium chloride flush, traMADol     Objective:     Vitals:    01/14/23 1606 01/15/23 0030 01/15/23 0500 01/15/23 0548   BP: 133/85 115/74  115/65   Pulse: 87 85  85   Resp: (!) 21 20  20    Temp: 98.8 F (37.1 C) 98.8 F (37.1 C)  97.7 F (36.5 C)   TempSrc: Oral Oral  Oral   SpO2: 96%      Weight:   82.5 kg    Height:         Wt Readings from Last 3 Encounters:   01/15/23 82.5 kg   12/11/21 74.8 kg   09/19/21 89.8 kg     Intake/Output Summary (Last 24 hours) at 01/15/2023 0752  Last data filed at 01/15/2023 0500  Gross per 24 hour   Intake 650.07 ml   Output 3775 ml   Net -3124.93 ml     Physical Exam    Awake Alert, No new F.N deficits, Normal affect  NC.AT,PERRAL  Supple Neck, No JVD,    Symmetrical Chest wall movement, Good air movement bilaterally, few coarse B sounds,  RRR,No Gallops,Rubs or new Murmurs,   +ve B.Sounds, Abd Soft, No tenderness,    No Cyanosis, Clubbing or edema  Data Review:     Recent Labs   Lab 01/10/23  1138 01/10/23  1302 01/11/23  0138 01/11/23  0358 01/11/23  2152 01/12/23  0116 01/13/23  0211 01/14/23  0500   WBC 2.7*  --  9.2  --   --  13.6* 18.8* 12.8*   HGB 14.7   < > 13.0 12.6* 11.6* 11.5* 12.7* 11.9*   HCT 46.6   < > 38.4* 37.0* 34.0* 35.2* 39.4 35.3*   PLT 277  --  187  --   --  188 171 174   MCV 93.8   --  88.9  --   --  89.6 90.8 88.3   MCH 29.6  --  30.1  --   --  29.3 29.3 29.8   MCHC 31.5  --  33.9  --   --  32.7 32.2 33.7   RDW 14.3  --  14.6  --   --  14.6 14.7 15.1   LYMPHSABS 0.6*  --   --   --   --   --   --  1.5   MONOABS 0.3  --   --   --   --   --   --  1.3*   EOSABS 0.0  --   --   --   --   --   --  0.0   BASOSABS 0.0  --   --   --   --   --   --  0.0    < > = values in this interval not displayed.     Recent Labs   Lab 01/10/23  1138 01/10/23  1139 01/10/23  1140 01/10/23  1302 01/10/23  1709 01/10/23  1711 01/11/23  0138 01/11/23  0358 01/11/23  2152 01/12/23  0116 01/12/23  0922 01/13/23  0211 01/14/23  0500   NA 140  --   --    < > 139  --  134* 137 140 139  --  139 139   K 4.1  --   --    < > 4.2  --  4.8 4.5 4.1 4.3  --  3.4* 3.3*   CL 99  --   --    < > 100  --  102  --   --  102  --  104 105   CO2 21*  --   --   --  23  --  21*  --   --  26  --  26 24   ANIONGAP 20*  --   --   --  16*  --  11  --   --  11  --  9 10   GLUCOSE 103*  --   --    < > 109*  --  104*  --   --  83  --  100* 101*   BUN 37*  --   --    < > 34*  --  35*  --   --  24*  --  17 19   CREATININE 3.86*  --   --    < > 3.08*  --  2.20*  --   --  1.31*  --  0.98 1.04   AST 2,316*  --   --   --   --   --   --   --   --   --   --  272* 151*   ALT 2,154*  --   --   --   --   --   --   --   --   --   --  732* 393*   ALKPHOS 73  --   --   --   --   --   --   --   --   --   --  89 95   BILITOT 0.7  --   --   --   --   --   --   --   --   --   --  1.3* 0.8   ALBUMIN 3.6  --   --   --   --   --   --   --   --   --   --  2.5* 2.2*   PROCALCITON  --   --   --   --  20.10  --   --   --   --   --   --   --   --    LATICACIDVEN  --   --  5.6*  --   --  4.5*  --   --   --   --  1.8  --   --    INR 1.3*  --   --   --   --   --   --   --   --   --   --   --   --    BNP  --  141.3*  --   --   --   --   --   --   --   --   --   --   --    MG  --   --   --   --   --   --  1.7  --   --  2.7*  --  2.1  --    CALCIUM 8.0*  --   --   --  7.4*  --   7.6*  --   --  8.6*  --  8.2* 8.1*    < > = values in this interval not displayed.       Recent Labs   Lab 01/10/23  1138 01/10/23  1139 01/10/23  1140 01/10/23  1709 01/10/23  1711 01/11/23  0138 01/12/23  0116 01/12/23  0922 01/13/23  0211 01/14/23  0500   PROCALCITON  --   --   --  20.10  --   --   --   --   --   --    LATICACIDVEN  --   --  5.6*  --  4.5*  --   --  1.8  --   --    INR 1.3*  --   --   --   --   --   --   --   --   --    BNP  --  141.3*  --   --   --   --   --   --   --   --    MG  --   --   --   --   --  1.7 2.7*  --  2.1  --    CALCIUM 8.0*  --   --  7.4*  --  7.6* 8.6*  --  8.2* 8.1*     ------------------------------------------------------------------------------------------------------------------  Lab Results   Component Value Date    CHOL 175 12/17/2021    HDL 50 12/17/2021    LDLCALC 100 (H) 12/17/2021    TRIG 127 12/17/2021    CHOLHDL 3.5 12/17/2021  Lab Results   Component Value Date    HGBA1C 5.9 (H) 12/17/2021     No results for input(s): "TSH", "T4TOTAL", "T3FREE", "THYROIDAB" in the last 72 hours.    Invalid input(s): "FREET3"  ------------------------------------------------------------------------------------------------------------------  Cardiac Enzymes  No results for input(s): "CKMB", "TROPONINI", "MYOGLOBIN" in the last 168 hours.    Invalid input(s): "CK"    Micro Results  Recent Results (from the past 240 hour(s))   Culture, blood (routine x 2)     Status: None    Collection Time: 01/10/23 11:39 AM    Specimen: BLOOD RIGHT WRIST   Result Value Ref Range Status    Specimen Description BLOOD RIGHT WRIST  Final    Special Requests   Final     BOTTLES DRAWN AEROBIC AND ANAEROBIC Blood Culture adequate volume    Culture   Final     NO GROWTH 5 DAYS  Performed at [facility], [address] [place], [place] [address]     Report Status 01/15/2023 FINAL  Final   Culture, blood (routine x 2)     Status: None    Collection Time: 01/10/23  5:09 PM    Specimen: BLOOD LEFT  HAND   Result Value Ref Range Status    Specimen Description BLOOD LEFT HAND  Final    Special Requests   Final     BOTTLES DRAWN AEROBIC AND ANAEROBIC Blood Culture adequate volume    Culture   Final     NO GROWTH 5 DAYS  Performed at [facility], [address], [place], [place] [address]     Report Status 01/15/2023 FINAL  Final   Culture, blood (Routine X 2) w Reflex to ID Panel     Status: None    Collection Time: 01/10/23  7:15 PM    Specimen: BLOOD LEFT HAND   Result Value Ref Range Status    Specimen Description BLOOD LEFT HAND  Final    Special Requests   Final     BOTTLES DRAWN AEROBIC AND ANAEROBIC Blood Culture adequate volume    Culture   Final     NO GROWTH 5 DAYS  Performed at [facility], [address], [place], [place] [address]     Report Status 01/15/2023 FINAL  Final   MRSA Next Gen by PCR, Nasal     Status: None    Collection Time: 01/10/23  9:16 PM    Specimen: Nasal Mucosa; Nasal Swab   Result Value Ref Range Status    MRSA by PCR Next Gen NOT DETECTED NOT DETECTED Final     Comment: (NOTE)  The GeneXpert MRSA Assay (FDA approved for NASAL specimens only),  is one component of a comprehensive MRSA colonization surveillance  program. It is not intended to diagnose MRSA infection nor to guide  or monitor treatment for MRSA infections.  Test performance is not FDA approved in patients less than 2 years  old.  Performed at [facility], [address] [place], [place]  [address]    Expectorated Sputum Assessment w Gram Stain, Rflx to Resp Cult     Status: None    Collection Time: 01/13/23  7:10 PM    Specimen: Sputum   Result Value Ref Range Status    Specimen Description SPUTUM  Final    Special Requests  EXPECTORATED  Final    Sputum evaluation   Final     THIS SPECIMEN IS ACCEPTABLE FOR SPUTUM  CULTURE  Performed at [facility], [address] [place], [place] [address]     Report Status 01/13/2023 FINAL  Final   Culture, Respiratory w Gram Stain     Status: None  (Preliminary result)    Collection Time: 01/13/23  7:10 PM    Specimen: SPU   Result Value Ref Range Status    Specimen Description SPUTUM  Final    Special Requests  EXPECTORATED Reflexed from [id number]  Final    Gram Stain   Final     FEW WBC PRESENT, PREDOMINANTLY PMN  RARE GRAM POSITIVE COCCI     Culture   Final     TOO YOUNG TO READ  Performed at [facility], [address] [place], [place] [address]     Report Status PENDING  Incomplete     Radiology Reports  DG Chest Port 1 View    Result Date: 01/15/2023  CLINICAL DATA:  Cough. EXAM: PORTABLE CHEST 1 VIEW COMPARISON:  Yesterday FINDINGS: Mild improvement in bilateral airspace disease which is more extensive on the right. Right PICC with tip at the upper cavoatrial junction. Stable heart size and mediastinal contours. No effusion or pneumothorax. IMPRESSION: Multifocal pneumonia with mildly improved aeration. Electronically Signed   By: Tiburcio Pea M.D.   On: 01/15/2023 06:19     ECHOCARDIOGRAM COMPLETE    Result Date: 01/14/2023     ECHOCARDIOGRAM REPORT   Patient Name:   ZACKARY MCKEONE Date of Exam: 01/14/2023 Medical Rec #:  [id number]    Height:       69.0 in Accession #:    [id number]   Weight:       182.5 lb Date of Birth:  [birth year]    BSA:          1.987 m Patient Age:    41 years     BP:           123/97 mmHg Patient Gender: M            HR:           92 bpm. Exam Location:  Inpatient Procedure: 2D Echo, Color Doppler, Cardiac Doppler and Intracardiac            Opacification Agent Indications:    NSTEMI I21.4  History:        Patient has no prior history of Echocardiogram examinations.                 NSTEMI, Substance Abuse; Risk Factors:Current Smoker.  Sonographer:    Aron Baba Referring Phys: [id number] Lorin Glass  Sonographer Comments: Suboptimal parasternal window. Image acquisition challenging due to respiratory motion. IMPRESSIONS  1. Left ventricular ejection fraction, by estimation, is 60 to 65%. The left ventricle has normal  function. The left ventricle has no regional wall motion abnormalities. Left ventricular diastolic parameters were normal.  2. Right ventricular systolic function is normal. The right ventricular size is normal.  3. The mitral valve is normal in structure. No evidence of mitral valve regurgitation. No evidence of mitral stenosis.  4. The aortic valve is normal in structure. Aortic valve regurgitation is not visualized. No aortic stenosis is present.  5. The inferior vena cava is normal in size with greater than 50% respiratory variability, suggesting right atrial pressure of 3 mmHg. FINDINGS  Left Ventricle: Left ventricular ejection fraction, by estimation, is 60 to 65%. The left ventricle has normal function. The left ventricle has no regional wall motion  abnormalities. Definity contrast agent was given IV to delineate the left ventricular  endocardial borders. The left ventricular internal cavity size was normal in size. There is no left ventricular hypertrophy. Left ventricular diastolic parameters were normal. Right Ventricle: The right ventricular size is normal. No increase in right ventricular wall thickness. Right ventricular systolic function is normal. Left Atrium: Left atrial size was normal in size. Right Atrium: Right atrial size was normal in size. Pericardium: There is no evidence of pericardial effusion. Mitral Valve: The mitral valve is normal in structure. No evidence of mitral valve regurgitation. No evidence of mitral valve stenosis. Tricuspid Valve: The tricuspid valve is normal in structure. Tricuspid valve regurgitation is not demonstrated. No evidence of tricuspid stenosis. Aortic Valve: The aortic valve is normal in structure. Aortic valve regurgitation is not visualized. No aortic stenosis is present. Pulmonic Valve: The pulmonic valve was normal in structure. Pulmonic valve regurgitation is not visualized. No evidence of pulmonic stenosis. Aorta: The aortic root is normal in size and  structure. Venous: The inferior vena cava is normal in size with greater than 50% respiratory variability, suggesting right atrial pressure of 3 mmHg. IAS/Shunts: No atrial level shunt detected by color flow Doppler.  LEFT VENTRICLE PLAX 2D LVIDd:         5.20 cm   Diastology LVIDs:         3.40 cm   LV e' medial:    10.10 cm/s LV PW:         1.00 cm   LV E/e' medial:  8.9 LV IVS:        1.00 cm   LV e' lateral:   15.00 cm/s LVOT diam:     1.90 cm   LV E/e' lateral: 6.0 LV SV:         62 LV SV Index:   31 LVOT Area:     2.84 cm  RIGHT VENTRICLE RV S prime:     13.10 cm/s TAPSE (M-mode): 1.6 cm LEFT ATRIUM           Index        RIGHT ATRIUM           Index LA diam:      3.20 cm 1.61 cm/m   RA Area:     14.90 cm LA Vol (A4C): 27.9 ml 14.04 ml/m  RA Volume:   41.70 ml  20.98 ml/m  AORTIC VALVE LVOT Vmax:   136.00 cm/s LVOT Vmean:  89.600 cm/s LVOT VTI:    0.218 m  AORTA Ao Root diam: 3.20 cm Ao Asc diam:  3.80 cm MITRAL VALVE MV Area (PHT): 3.93 cm    SHUNTS MV Decel Time: 193 msec    Systemic VTI:  0.22 m MV E velocity: 90.10 cm/s  Systemic Diam: 1.90 cm MV A velocity: 59.70 cm/s MV E/A ratio:  1.51 Charlton Haws MD Electronically signed by Charlton Haws MD Signature Date/Time: 01/14/2023/2:53:08 PM    Final      DG CHEST PORT 1 VIEW    Result Date: 01/14/2023  CLINICAL DATA:  Respiratory failure EXAM: PORTABLE CHEST 1 VIEW COMPARISON:  CXR 01/12/23 FINDINGS: Right arm PICC with the tip at the cavoatrial junction. There are persistent bilateral patchy airspace opacities, not significantly changed from prior exam, that remain worrisome for infection. No pleural effusion. No pneumothorax. No radiographically apparent displaced rib fractures. Visualized upper abdomen is unremarkable. IMPRESSION: No significant change in bilateral patchy airspace opacities, which remain worrisome for infection. Improved bilateral  pleural effusions. Electronically Signed   By: Lorenza Cambridge M.D.   On: 01/14/2023 07:53     DG Chest Port 1  View    Result Date: 01/12/2023  CLINICAL DATA:  Pneumonia. EXAM: PORTABLE CHEST 1 VIEW COMPARISON:  Radiograph yesterday. CT 01/10/2023 FINDINGS: Significant patient rotation on the current exam, lung volumes are low. Tip of the right upper extremity PICC at the atrial caval junction. Heterogeneous bilateral lung opacities with equivocal worsening from yesterday. Small bilateral pleural effusions. No pneumothorax. Stable heart size and mediastinal contours allowing for rotation and low lung volumes. IMPRESSION: 1. Equivocal worsening of bilateral lung opacities since yesterday. 2. Small bilateral pleural effusions. 3. Stable right upper extremity PICC. Electronically Signed   By: Narda Rutherford M.D.   On: 01/12/2023 11:33     DG CHEST PORT 1 VIEW    Result Date: 01/11/2023  CLINICAL DATA:  Hypoxia EXAM: PORTABLE CHEST 1 VIEW COMPARISON:  01/11/2023 FINDINGS: Interval placement of a right sided PICC is seen at the cavoatrial junction. Cardiac shadow is enlarged accentuated by the portable technique. Increasing central vascular congestion is noted. Multifocal airspace opacities are noted slightly increased from the prior study. This is likely related to a poor inspiratory effort. Some superimposed edema could not be totally excluded. IMPRESSION: Patchy airspace opacities bilaterally somewhat increased from the prior exam likely related to a poor inspiratory effort. New right-sided PICC is seen in satisfactory position. Electronically Signed   By: Alcide Clever M.D.   On: 01/11/2023 21:03       Signature  -   Susa Raring M.D on 01/15/2023 at 7:52 AM   -  To page go to [email or web address]    Electronically signed by Leroy Sea, MD at 01/15/2023  5:45 PM EDT

## 2023-01-15 NOTE — Progress Notes (Signed)
Formatting of this note is different from the original.  Physical Therapy Treatment  Patient Details  Name: Lucas Hicks  MRN: [id number]  DOB: [birth year]  Today's Date: 01/15/2023    History of Present Illness 42 yo male admitted 4/5 after found unresponsive in hotel. Combative after narcan. Intubated for airway protection 4/5-4/6. Aspiration PNA with respiratory failure. PMHx: Polysubstance abuse, Hep C, IVDA, depression, ETOH, substance induced mood disorder      PT Comments      Pt greeted supine in bed and agreeable to session with continued progress towards acute goals. Pt continues to be limited by decreased activity tolerance, fatigue, and decreased cardiopulmonary endurance, however pt motivated to return to PLOF. Pt able to demonstrate gait for hallway distance with min guard and RW support with seated rest needed due to fatigue. Pt able to complete short bout of gait without AD with  min A to steady as pt with increased instability without RW use. Pt needing cues throughout session for upright posture and breathing techniques as pt with tendency for anterior trunk flexion with increased fatigue. Educated pt on importance of continued and frequent mobility and importance of time up OOB with pt verbalizing understanding.  Pt continues to benefit from skilled PT services to progress toward functional mobility goals.     Recommendations for follow up therapy are one component of a multi-disciplinary discharge planning process, led by the attending physician.  Recommendations may be updated based on patient status, additional functional criteria and insurance authorization.    Follow Up Recommendations        Assistance Recommended at Discharge PRN   Patient can return home with the following Assist for transportation    Equipment Recommendations   None recommended by PT     Recommendations for Other Services        Precautions / Restrictions Precautions  Precautions: Fall  Precaution Comments:  HHFNC  Restrictions  Weight Bearing Restrictions: No       Mobility   Bed Mobility  Overal bed mobility: Needs Assistance  Bed Mobility: Supine to Sit, Sit to Supine      Supine to sit: Min assist  Sit to supine: Min guard    General bed mobility comments: pt reaching for this PTA to elevate trunk needing min A to complete, min guard to return to supine      Transfers  Overall transfer level: Needs assistance  Equipment used: Rolling walker (2 wheels), None  Transfers: Sit to/from Stand  Sit to Stand: Min guard            General transfer comment: cues for hand placement able to complete x2 from EOB and x1 from standard chair      Ambulation/Gait  Ambulation/Gait assistance: Min guard, Min assist  Gait Distance (Feet): 200 Feet (x2 + 40)  Assistive device: Rolling walker (2 wheels), None  Gait Pattern/deviations: Step-through pattern, Decreased stride length  Gait velocity: decr      General Gait Details: slow fairly steady gait with RW support, cues for posture and proximity to RW, SpO2 >88% on 5L NC, improving to >90% with cues for breathing techniques, x1 seated rest break due to fatigue, able to ocmpelte short bout without AD and min A to steady, increased postural sway and drift R/L without AD use    Stairs              Wheelchair Mobility      Modified Rankin (Stroke Patients Only)  Balance Overall balance assessment: Mild deficits observed, not formally tested  Sitting-balance support: Feet supported  Sitting balance-Leahy Scale: Poor  Sitting balance - Comments: unable to sustain unsupported sitting for >2 minutes    Standing balance support: Bilateral upper extremity supported, During functional activity  Standing balance-Leahy Scale: Poor  Standing balance comment: prefers BUE supported                              Cognition Arousal/Alertness: Awake/alert  Behavior During Therapy: WFL for tasks assessed/performed  Overall Cognitive Status: Impaired/Different from baseline  Area of Impairment:  Memory                                General Comments: A&Ox4, followed commands appropriately. Limited insight.          Exercises        General Comments General comments (skin integrity, edema, etc.): VSS on supplemental O2        Pertinent Vitals/Pain Pain Assessment  Pain Assessment: Faces  Faces Pain Scale: Hurts little more  Pain Location: L shoulder with movement  Pain Descriptors / Indicators: Discomfort, Sore  Pain Intervention(s): Monitored during session, Limited activity within patient's tolerance, Patient requesting pain meds-RN notified     Home Living                          Prior Function             PT Goals (current goals can now be found in the care plan section) Acute Rehab PT Goals  PT Goal Formulation: With patient  Time For Goal Achievement: 01/21/23  Progress towards PT goals: Progressing toward goals      Frequency     Min 3X/week      PT Plan       Co-evaluation                AM-PAC PT "6 Clicks" Mobility    Outcome Measure   Help needed turning from your back to your side while in a flat bed without using bedrails?: None  Help needed moving from lying on your back to sitting on the side of a flat bed without using bedrails?: A Little  Help needed moving to and from a bed to a chair (including a wheelchair)?: A Little  Help needed standing up from a chair using your arms (e.g., wheelchair or bedside chair)?: A Little  Help needed to walk in hospital room?: A Little  Help needed climbing 3-5 steps with a railing? : A Little  6 Click Score: 19      End of Session Equipment Utilized During Treatment: Oxygen;Gait belt  Activity Tolerance: Patient tolerated treatment well  Patient left: with call bell/phone within reach;in bed;with family/visitor present  Nurse Communication: Mobility status;Patient requests pain meds  PT Visit Diagnosis: Other abnormalities of gait and mobility (R26.89)      Time: 4540-9811  PT Time Calculation (min) (ACUTE ONLY): 21 min    Charges:  $Gait Training: 8-22  mins               Anna R. PTA  Acute Rehabilitation Services  Office: [phone]    Catalina Antigua  01/15/2023, 10:00 AM      Electronically signed by Catalina Antigua, PTA at 01/15/2023 10:33 AM EDT

## 2023-01-15 NOTE — Progress Notes (Signed)
Formatting of this note might be different from the original.  Patient continues to improve now on reg NC.    Available PRN, recs per latest note.    Myrla Halsted MD PCCM  Electronically signed by Lorin Glass, MD at 01/15/2023  7:37 AM EDT

## 2023-01-15 NOTE — Progress Notes (Signed)
Formatting of this note is different from the original.  Mobility Specialist - Progress Note     01/15/23 1458   Mobility   Activity Ambulated with assistance in hallway   Level of Assistance Standby assist, set-up cues, supervision of patient - no hands on   Assistive Device Front wheel walker   Distance Ambulated (ft) 230 ft   Activity Response Tolerated well   Mobility Referral Yes   $Mobility charge 1 Mobility     Pt was received in chair and agreeable to mobility. Pt required x2 standing rest breaks d/t SOB. Pt was able to ambulate on 5L/min. SpO2 ranging between 83%-93%, however pleth was unreliable. Pt was returned to EOB with all needs met.     Alda Lea   Mobility Specialist  Please contact via Special educational needs teacher or  Rehab office at [phone]   Electronically signed by Philbert Riser at 01/15/2023  3:02 PM EDT

## 2023-01-16 ENCOUNTER — Inpatient Hospital Stay (HOSPITAL_COMMUNITY): Payer: Medicaid Other

## 2023-01-16 DIAGNOSIS — G929 Unspecified toxic encephalopathy: Secondary | ICD-10-CM | POA: Diagnosis not present

## 2023-01-16 LAB — CBC WITH DIFFERENTIAL/PLATELET
Abs Immature Granulocytes: 0 10*3/uL (ref 0.00–0.07)
Basophils Absolute: 0 10*3/uL (ref 0.0–0.1)
Basophils Relative: 0 %
Eosinophils Absolute: 0 10*3/uL (ref 0.0–0.5)
Eosinophils Relative: 0 %
HCT: 37.2 % — ABNORMAL LOW (ref 39.0–52.0)
Hemoglobin: 12.5 g/dL — ABNORMAL LOW (ref 13.0–17.0)
Lymphocytes Relative: 13 %
Lymphs Abs: 2.1 10*3/uL (ref 0.7–4.0)
MCH: 30 pg (ref 26.0–34.0)
MCHC: 33.6 g/dL (ref 30.0–36.0)
MCV: 89.2 fL (ref 80.0–100.0)
Monocytes Absolute: 1 10*3/uL (ref 0.1–1.0)
Monocytes Relative: 6 %
Neutro Abs: 13.1 10*3/uL — ABNORMAL HIGH (ref 1.7–7.7)
Neutrophils Relative %: 81 %
Platelets: 235 10*3/uL (ref 150–400)
RBC: 4.17 MIL/uL — ABNORMAL LOW (ref 4.22–5.81)
RDW: 15.7 % — ABNORMAL HIGH (ref 11.5–15.5)
WBC: 16.2 10*3/uL — ABNORMAL HIGH (ref 4.0–10.5)
nRBC: 0 % (ref 0.0–0.2)
nRBC: 0 /100 WBC

## 2023-01-16 LAB — COMPREHENSIVE METABOLIC PANEL
ALT: 221 U/L — ABNORMAL HIGH (ref 0–44)
AST: 59 U/L — ABNORMAL HIGH (ref 15–41)
Albumin: 2.3 g/dL — ABNORMAL LOW (ref 3.5–5.0)
Alkaline Phosphatase: 87 U/L (ref 38–126)
Anion gap: 11 (ref 5–15)
BUN: 20 mg/dL (ref 6–20)
CO2: 27 mmol/L (ref 22–32)
Calcium: 8.8 mg/dL — ABNORMAL LOW (ref 8.9–10.3)
Chloride: 100 mmol/L (ref 98–111)
Creatinine, Ser: 0.94 mg/dL (ref 0.61–1.24)
GFR, Estimated: >60 mL/min (ref >60)
Glucose, Bld: 116 mg/dL — ABNORMAL HIGH (ref 70–99)
Potassium: 3.8 mmol/L (ref 3.5–5.1)
Sodium: 138 mmol/L (ref 135–145)
Total Bilirubin: 0.5 mg/dL (ref 0.3–1.2)
Total Protein: 6.1 g/dL — ABNORMAL LOW (ref 6.5–8.1)

## 2023-01-16 LAB — CULTURE, RESPIRATORY W GRAM STAIN: Culture: NORMAL

## 2023-01-16 LAB — LIPASE, BLOOD: Lipase: 76 U/L — ABNORMAL HIGH (ref 11–51)

## 2023-01-16 LAB — TROPONIN I (HIGH SENSITIVITY)
Troponin I (High Sensitivity): 25 ng/L — ABNORMAL HIGH (ref <18)
Troponin I (High Sensitivity): 26 ng/L — ABNORMAL HIGH (ref <18)

## 2023-01-16 LAB — MAGNESIUM: Magnesium: 2.2 mg/dL (ref 1.7–2.4)

## 2023-01-16 LAB — BRAIN NATRIURETIC PEPTIDE: B Natriuretic Peptide: 14.5 pg/mL (ref 0.0–100.0)

## 2023-01-16 LAB — PROCALCITONIN: Procalcitonin: 0.5 ng/mL

## 2023-01-16 LAB — PROTIME-INR
INR: 1.2 (ref 0.8–1.2)
Prothrombin Time: 15.4 seconds — ABNORMAL HIGH (ref 11.4–15.2)

## 2023-01-16 LAB — C-REACTIVE PROTEIN: CRP: 11.4 mg/dL — ABNORMAL HIGH (ref <1.0)

## 2023-01-16 MED ORDER — SIMETHICONE 80 MG PO CHEW
80.0000 mg | CHEWABLE_TABLET | Freq: Four times a day (QID) | ORAL | Status: DC | PRN
  Administered 2023-01-16: 80 mg via ORAL
  Filled 2023-01-16: qty 1

## 2023-01-16 MED ORDER — FUROSEMIDE 10 MG/ML IJ SOLN
40.0000 mg | Freq: Once | INTRAMUSCULAR | Status: AC
  Administered 2023-01-16: 40 mg via INTRAVENOUS
  Filled 2023-01-16: qty 4

## 2023-01-16 MED ORDER — CARBAMIDE PEROXIDE 6.5 % OT SOLN
5.0000 [drp] | Freq: Two times a day (BID) | OTIC | Status: DC
  Administered 2023-01-16 – 2023-01-17 (×3): 5 [drp] via OTIC
  Filled 2023-01-16: qty 15

## 2023-01-16 MED ORDER — IOHEXOL 350 MG/ML SOLN
75.0000 mL | Freq: Once | INTRAVENOUS | Status: AC | PRN
  Administered 2023-01-16: 75 mL via INTRAVENOUS

## 2023-01-16 NOTE — Progress Notes (Signed)
Patient to xray.

## 2023-01-16 NOTE — Progress Notes (Signed)
RT note. Patient found with [place] off and sat in 80's, no labored breathing noted at this time. Patient instructed to keep [place] on at this time, turned up to 6 L [place], sat 88%. RT will continue to monitor, and will place patient on HFNC if needed later.    01/16/23 0845  Oxygen Therapy/Pulse Ox  O2 Device Nasal Cannula  O2 Therapy Oxygen humidified  O2 Flow Rate (L/min) (S)  6 L/min (pt. found by RN with [place] off and sat in 80s.)  SpO2 (!) 87 %

## 2023-01-16 NOTE — Progress Notes (Signed)
OT Cancellation Note  Patient Details Name: Randall Parsons MRN: [id number] DOB: Oct 25, 1980   Cancelled Treatment:    Reason Eval/Treat Not Completed: Patient at procedure or test/ unavailable. Pt off unit for CT. OT will follow up again next available time  Galen Manila 01/16/2023, 12:48 PM

## 2023-01-16 NOTE — Progress Notes (Signed)
OT Cancellation Note  Patient Details Name: Randall Parsons MRN: [id number] DOB: [birth year]   Cancelled Treatment:    Reason Eval/Treat Not Completed: Pain limiting ability to participate. Pt c/o stomach pains, calling nurse for assist. OT will follow up next available time  Galen Manila 01/16/2023, 11:26 AM

## 2023-01-16 NOTE — Progress Notes (Signed)
PROGRESS NOTE                                                                                                                                                                                                             Patient Demographics:    Randall Parsons, is a 42 y.o. male, DOB - [birth year], XBJ:[id number]  Outpatient Primary MD for the patient is [facility]    LOS - 6  Admit date - 01/10/2023    CC - SOB      Brief Narrative (HPI from H&P)   42 year old male patient with history Polysubstance abuse, Hep C, IVDA, depression, ETOH, substance induced mood disorder, he was found unresponsive at the hotel and was brought to the ER unresponsive apneic with pinpoint pupils presumed to have drug overdose, he was found to have pneumonia along with acute hypoxic respiratory failure he was intubated admitted to ICU, stabilized transferred to hospitalist service on 01/15/2023 on day 5 of his hospital stay.   Subjective:   Patient in bed, appears comfortable, denies any headache, no fever, no chest pain or pressure, no shortness of breath , no abdominal pain. No focal weakness.   Assessment  & Plan :   Acute hypoxic respiratory failure secondary to aspiration pneumonia, pulmonary edema, NSTEMI, in the setting of acute toxic encephalopathy caused by recreational drug overdose, history of IVDA in the past. He was admitted to the ICU intubated for 24 hours, started on antibiotics, overall improving, currently on nasal cannula oxygen still completing his antibiotic treatment, tapered off oral steroids, counseled to abstain from all drug use.  Advance activity, encouraged to sit up in chair use I-S and flutter valve for pulmonary toiletry, continue to monitor.  History of positive hep C.  Outpatient follow-up with GI and ID.  Elevated troponin, likely demand ischemia caused by hypoxia from #1 above along with cocaine use.  EKG  and echo stable with preserved EF and no wall motion abnormality, chest pain-free, continue to monitor.  Consult to quit doing cocaine and benzodiazepine abuse.  Transaminitis with mild downtrending elevation of lipase.  Likely due to shock liver caused by hypoxic respiratory failure hypotension and anoxia, trend is improving.  Symptom-free now.  Ileus induced abdominal pain on 01/15/2023.  Resolved after supportive care.  Feeling back to baseline.  Condition - Fair  Family Communication  :  None  Code Status :  Full  Consults  :  PCCM  PUD Prophylaxis :    Procedures  :     TTE -  1. Left ventricular ejection fraction, by estimation, is 60 to 65%. The left ventricle has normal function. The left ventricle has no regional wall motion abnormalities. Left ventricular diastolic parameters were normal.  2. Right ventricular systolic function is normal. The right ventricular size is normal.  3. The mitral valve is normal in structure. No evidence of mitral valve regurgitation. No evidence of mitral stenosis.  4. The aortic valve is normal in structure. Aortic valve regurgitation is not visualized. No aortic stenosis is present.  5. The inferior vena cava is normal in size with greater than 50% respiratory variability, suggesting right atrial pressure of 3 mmHg.      Disposition Plan  :    Status is: Inpatient  DVT Prophylaxis  :    heparin injection 5,000 Units Start: 01/11/23 1400 SCDs Start: 01/10/23 1250    Lab Results  Component Value Date   PLT 235 01/16/2023    Diet :  Diet Order             Diet Heart Fluid consistency: Thin  Diet effective now                    Inpatient Medications  Scheduled Meds:  alteplase  2 mg Intracatheter Once   arformoterol  15 mcg Nebulization BID   budesonide (PULMICORT) nebulizer solution  0.25 mg Nebulization BID   calcium carbonate  1 tablet Oral BID   Chlorhexidine Gluconate Cloth  6 each Topical Daily   heparin  injection (subcutaneous)  5,000 Units Subcutaneous Q8H   lamoTRIgine  100 mg Oral BID   levofloxacin  750 mg Oral Daily   mouth rinse  15 mL Mouth Rinse 4 times per day   senna  1 tablet Oral QHS   Continuous Infusions:  sodium chloride Stopped (01/10/23 2038)   PRN Meds:.acetaminophen, chlorpheniramine-HYDROcodone, clonazePAM, docusate, guaiFENesin, haloperidol lactate, melatonin, ondansetron (ZOFRAN) IV, mouth rinse, polyethylene glycol, polyvinyl alcohol, sodium chloride flush, traMADol     Objective:   Vitals:   01/16/23 0500 01/16/23 0752 01/16/23 0753 01/16/23 0800  BP:    105/60  Pulse:    84  Resp:    18  Temp:    97.9 F (36.6 C)  TempSrc:    Oral  SpO2:  91% (!) 86% (!) 86%  Weight: 82.4 kg     Height:        Wt Readings from Last 3 Encounters:  01/16/23 82.4 kg  12/11/21 74.8 kg  09/19/21 89.8 kg     Intake/Output Summary (Last 24 hours) at 01/16/2023 0805 Last data filed at 01/15/2023 2106 Gross per 24 hour  Intake 240 ml  Output 250 ml  Net -10 ml     Physical Exam  Awake Alert, No new F.N deficits, Normal affect, right arm PICC line [place].AT,PERRAL Supple Neck, No JVD,   Symmetrical Chest wall movement, Good air movement bilaterally, CTAB RRR,No Gallops, Rubs or new Murmurs,  +ve B.Sounds, Abd Soft, No tenderness,   No Cyanosis, Clubbing or edema    Data Review:    Recent Labs  Lab 01/10/23 1138 01/10/23 1302 01/12/23 0116 01/13/23 0211 01/14/23 0500 01/15/23 0818 01/16/23 0328  WBC 2.7*   < > 13.6* 18.8* 12.8* 16.7* 16.2*  HGB 14.7   < >  11.5* 12.7* 11.9* 13.0 12.5*  HCT 46.6   < > 35.2* 39.4 35.3* 38.2* 37.2*  PLT 277   < > 188 171 174 216 235  MCV 93.8   < > 89.6 90.8 88.3 88.4 89.2  MCH 29.6   < > 29.3 29.3 29.8 30.1 30.0  MCHC 31.5   < > 32.7 32.2 33.7 34.0 33.6  RDW 14.3   < > 14.6 14.7 15.1 15.6* 15.7*  LYMPHSABS 0.6*  --   --   --  1.5  --  2.1  MONOABS 0.3  --   --   --  1.3*  --  1.0  EOSABS 0.0  --   --   --  0.0  --  0.0   BASOSABS 0.0  --   --   --  0.0  --  0.0   < > = values in this interval not displayed.    Recent Labs  Lab 01/10/23 1138 01/10/23 1139 01/10/23 1140 01/10/23 1302 01/10/23 1709 01/10/23 1711 01/11/23 0138 01/11/23 0358 01/12/23 0116 01/12/23 0922 01/13/23 0211 01/14/23 0500 01/15/23 0818 01/16/23 0328  NA 140  --   --    < > 139  --  134*   < > 139  --  139 139 137 138  K 4.1  --   --    < > 4.2  --  4.8   < > 4.3  --  3.4* 3.3* 3.3* 3.8  CL 99  --   --    < > 100  --  102  --  102  --  104 105 102 100  CO2 21*  --   --   --  23  --  21*  --  26  --  26 24 27 27   ANIONGAP 20*  --   --   --  16*  --  11  --  11  --  9 10 8 11   GLUCOSE 103*  --   --    < > 109*  --  104*  --  83  --  100* 101* 118* 116*  BUN 37*  --   --    < > 34*  --  35*  --  24*  --  17 19 20 20   CREATININE 3.86*  --   --    < > 3.08*  --  2.20*  --  1.31*  --  0.98 1.04 1.20 0.94  AST 2,316*  --   --   --   --   --   --   --   --   --  272* 151* 85* 59*  ALT 2,154*  --   --   --   --   --   --   --   --   --  732* 393* 296* 221*  ALKPHOS 73  --   --   --   --   --   --   --   --   --  89 95 94 87  BILITOT 0.7  --   --   --   --   --   --   --   --   --  1.3* 0.8 0.9 0.5  ALBUMIN 3.6  --   --   --   --   --   --   --   --   --  2.5* 2.2* 2.5* 2.3*  CRP  --   --   --   --   --   --   --   --   --   --   --   --  19.9* 11.4*  PROCALCITON  --   --   --   --  20.10  --   --   --   --   --   --   --  1.64  --   LATICACIDVEN  --   --  5.6*  --   --  4.5*  --   --   --  1.8  --   --   --   --   INR 1.3*  --   --   --   --   --   --   --   --   --   --   --   --  1.2  BNP  --  141.3*  --   --   --   --   --   --   --   --   --   --  21.1 14.5  MG  --   --   --   --   --   --  1.7  --  2.7*  --  2.1  --  2.2 2.2  CALCIUM 8.0*  --   --   --  7.4*  --  7.6*  --  8.6*  --  8.2* 8.1* 8.4* 8.8*   < > = values in this interval not displayed.        Radiology Reports DG Abd Portable 1V  Result Date:  01/16/2023 CLINICAL DATA:  Nausea.  Mid abdominal pain. EXAM: PORTABLE ABDOMEN - 1 VIEW COMPARISON:  01/15/2023 FINDINGS: Similar appearance of mild gaseous distension of the small and large bowel loops without signs to suggest high-grade bowel obstruction. No signs of pneumoperitoneum. Osseous structures are unremarkable. IMPRESSION: Similar appearance of mild gaseous distension of the small and large bowel loops without signs to suggest high-grade bowel obstruction. Findings may reflect ileus. Electronically Signed   By: Signa Kell M.D.   On: 01/16/2023 06:41   DG Abd Portable 1V  Result Date: 01/15/2023 CLINICAL DATA:  Abdominal pain EXAM: PORTABLE ABDOMEN - 1 VIEW COMPARISON:  None Available. FINDINGS: Mild distention of both small and large bowel loops. No supine evidence of free air. No acute osseous abnormality. IMPRESSION: Mild distention of both small and large bowel loops, which may represent ileus. Electronically Signed   By: Allegra Lai M.D.   On: 01/15/2023 18:22   DG Chest Port 1 View  Result Date: 01/15/2023 CLINICAL DATA:  Cough. EXAM: PORTABLE CHEST 1 VIEW COMPARISON:  Yesterday FINDINGS: Mild improvement in bilateral airspace disease which is more extensive on the right. Right PICC with tip at the upper cavoatrial junction. Stable heart size and mediastinal contours. No effusion or pneumothorax. IMPRESSION: Multifocal pneumonia with mildly improved aeration. Electronically Signed   By: Tiburcio Pea M.D.   On: 01/15/2023 06:19   ECHOCARDIOGRAM COMPLETE  Result Date: 01/14/2023    ECHOCARDIOGRAM REPORT   Patient Name:   Randall Parsons Date of Exam: 01/14/2023 Medical Rec #:  [id number]    Height:       69.0 in Accession #:    [id number]   Weight:       182.5 lb Date of Birth:  1981-01-30    BSA:          1.987 m Patient Age:    41 years     BP:           123/97 mmHg Patient Gender: M  HR:           92 bpm. Exam Location:  Inpatient Procedure: 2D Echo, Color Doppler,  Cardiac Doppler and Intracardiac            Opacification Agent Indications:    NSTEMI I21.4  History:        Patient has no prior history of Echocardiogram examinations.                 NSTEMI, Substance Abuse; Risk Factors:Current Smoker.  Sonographer:    Aron BabaNorma Walker Referring Phys: [id number] Lorin GlassANIEL C SMITH  Sonographer Comments: Suboptimal parasternal window. Image acquisition challenging due to respiratory motion. IMPRESSIONS  1. Left ventricular ejection fraction, by estimation, is 60 to 65%. The left ventricle has normal function. The left ventricle has no regional wall motion abnormalities. Left ventricular diastolic parameters were normal.  2. Right ventricular systolic function is normal. The right ventricular size is normal.  3. The mitral valve is normal in structure. No evidence of mitral valve regurgitation. No evidence of mitral stenosis.  4. The aortic valve is normal in structure. Aortic valve regurgitation is not visualized. No aortic stenosis is present.  5. The inferior vena cava is normal in size with greater than 50% respiratory variability, suggesting right atrial pressure of 3 mmHg. FINDINGS  Left Ventricle: Left ventricular ejection fraction, by estimation, is 60 to 65%. The left ventricle has normal function. The left ventricle has no regional wall motion abnormalities. Definity contrast agent was given IV to delineate the left ventricular  endocardial borders. The left ventricular internal cavity size was normal in size. There is no left ventricular hypertrophy. Left ventricular diastolic parameters were normal. Right Ventricle: The right ventricular size is normal. No increase in right ventricular wall thickness. Right ventricular systolic function is normal. Left Atrium: Left atrial size was normal in size. Right Atrium: Right atrial size was normal in size. Pericardium: There is no evidence of pericardial effusion. Mitral Valve: The mitral valve is normal in structure. No evidence of  mitral valve regurgitation. No evidence of mitral valve stenosis. Tricuspid Valve: The tricuspid valve is normal in structure. Tricuspid valve regurgitation is not demonstrated. No evidence of tricuspid stenosis. Aortic Valve: The aortic valve is normal in structure. Aortic valve regurgitation is not visualized. No aortic stenosis is present. Pulmonic Valve: The pulmonic valve was normal in structure. Pulmonic valve regurgitation is not visualized. No evidence of pulmonic stenosis. Aorta: The aortic root is normal in size and structure. Venous: The inferior vena cava is normal in size with greater than 50% respiratory variability, suggesting right atrial pressure of 3 mmHg. IAS/Shunts: No atrial level shunt detected by color flow Doppler.  LEFT VENTRICLE PLAX 2D LVIDd:         5.20 cm   Diastology LVIDs:         3.40 cm   LV e' medial:    10.10 cm/s LV PW:         1.00 cm   LV E/e' medial:  8.9 LV IVS:        1.00 cm   LV e' lateral:   15.00 cm/s LVOT diam:     1.90 cm   LV E/e' lateral: 6.0 LV SV:         62 LV SV Index:   31 LVOT Area:     2.84 cm  RIGHT VENTRICLE RV S prime:     13.10 cm/s TAPSE (M-mode): 1.6 cm LEFT ATRIUM  Index        RIGHT ATRIUM           Index LA diam:      3.20 cm 1.61 cm/m   RA Area:     14.90 cm LA Vol (A4C): 27.9 ml 14.04 ml/m  RA Volume:   41.70 ml  20.98 ml/m  AORTIC VALVE LVOT Vmax:   136.00 cm/s LVOT Vmean:  89.600 cm/s LVOT VTI:    0.218 m  AORTA Ao Root diam: 3.20 cm Ao Asc diam:  3.80 cm MITRAL VALVE MV Area (PHT): 3.93 cm    SHUNTS MV Decel Time: 193 msec    Systemic VTI:  0.22 m MV E velocity: 90.10 cm/s  Systemic Diam: 1.90 cm MV A velocity: 59.70 cm/s MV E/A ratio:  1.51 Charlton Haws MD Electronically signed by Charlton Haws MD Signature Date/Time: 01/14/2023/2:53:08 PM    Final    DG CHEST PORT 1 VIEW  Result Date: 01/14/2023 CLINICAL DATA:  Respiratory failure EXAM: PORTABLE CHEST 1 VIEW COMPARISON:  CXR 01/12/23 FINDINGS: Right arm PICC with the tip at the  cavoatrial junction. There are persistent bilateral patchy airspace opacities, not significantly changed from prior exam, that remain worrisome for infection. No pleural effusion. No pneumothorax. No radiographically apparent displaced rib fractures. Visualized upper abdomen is unremarkable. IMPRESSION: No significant change in bilateral patchy airspace opacities, which remain worrisome for infection. Improved bilateral pleural effusions. Electronically Signed   By: Lorenza Cambridge M.D.   On: 01/14/2023 07:53   DG Chest Port 1 View  Result Date: 01/12/2023 CLINICAL DATA:  Pneumonia. EXAM: PORTABLE CHEST 1 VIEW COMPARISON:  Radiograph yesterday. CT 01/10/2023 FINDINGS: Significant patient rotation on the current exam, lung volumes are low. Tip of the right upper extremity PICC at the atrial caval junction. Heterogeneous bilateral lung opacities with equivocal worsening from yesterday. Small bilateral pleural effusions. No pneumothorax. Stable heart size and mediastinal contours allowing for rotation and low lung volumes. IMPRESSION: 1. Equivocal worsening of bilateral lung opacities since yesterday. 2. Small bilateral pleural effusions. 3. Stable right upper extremity PICC. Electronically Signed   By: Narda Rutherford M.D.   On: 01/12/2023 11:33      Signature  -   Susa Raring M.D on 01/16/2023 at 8:05 AM   -  To page go to [email or web address]

## 2023-01-16 NOTE — Progress Notes (Signed)
Patient to ct

## 2023-01-16 NOTE — Progress Notes (Signed)
Formatting of this note might be different from the original.  Patient to xray.   Electronically signed by Roselee Nova, RN at 01/16/2023 12:32 PM EDT

## 2023-01-16 NOTE — Progress Notes (Signed)
Formatting of this note might be different from the original.  Patient to ct.   Electronically signed by Roselee Nova, RN at 01/16/2023 12:33 PM EDT

## 2023-01-16 NOTE — Progress Notes (Signed)
Formatting of this note is different from the original.  RT note.  Patient found with Nc off and sat in 80's, no labored breathing noted at this time. Patient instructed to keep NC on at this time, turned up to 6 L NC, sat 88%. RT will continue to monitor, and will place patient on HFNC if needed later.      01/16/23 0845   Oxygen Therapy/Pulse Ox   O2 Device Nasal Cannula   O2 Therapy Oxygen humidified   O2 Flow Rate (L/min) (S)  6 L/min  (pt. found by RN with NC off and sat in 80s.)   SpO2 (!) 87 %       Electronically signed by Reatha Harps, RRT at 01/16/2023  8:47 AM EDT

## 2023-01-16 NOTE — Progress Notes (Signed)
Formatting of this note might be different from the original.  OT Cancellation Note    Patient Details  Name: Lucas Hicks  MRN: [id number]  DOB: Sep 21, 1981    Cancelled Treatment:    Reason Eval/Treat Not Completed: Patient at procedure or test/ unavailable. Pt off unit for CT. OT will follow up again next available time    Galen Manila  01/16/2023, 12:48 PM  Electronically signed by Lafe Garin, OT at 01/16/2023 12:48 PM EDT

## 2023-01-16 NOTE — Progress Notes (Signed)
Formatting of this note is different from the original.  Images from the original note were not included.                                      PROGRESS NOTE                                                     Patient Demographics:      Lucas Hicks, is a 42 y.o. male, DOB - Aug 09, 1981, ZOX:[id number]    Outpatient Primary MD for the patient is [facility]    LOS - 6  Admit date - 01/10/2023      CC - SOB      Brief Narrative (HPI from H&P)   42 year old male patient with history Polysubstance abuse, Hep C, IVDA, depression, ETOH, substance induced mood disorder, he was found unresponsive at the hotel and was brought to the ER unresponsive apneic with pinpoint pupils presumed to have drug overdose, he was found to have pneumonia along with acute hypoxic respiratory failure he was intubated admitted to ICU, stabilized transferred to hospitalist service on 01/15/2023 on day 5 of his hospital stay.     Subjective:     Patient in bed, appears comfortable, denies any headache, no fever, no chest pain or pressure, no shortness of breath , no abdominal pain. No focal weakness.     Assessment  & Plan :     Acute hypoxic respiratory failure secondary to aspiration pneumonia, pulmonary edema, NSTEMI, in the setting of acute toxic encephalopathy caused by recreational drug overdose, history of IVDA in the past. He was admitted to the ICU intubated for 24 hours, started on antibiotics, overall improving, currently on nasal cannula oxygen still completing his antibiotic treatment, tapered off oral steroids, counseled to abstain from all drug use.  Advance activity, encouraged to sit up in chair use I-S and flutter valve for pulmonary toiletry, continue to monitor.    History of positive hep C.  Outpatient follow-up with GI and ID.    Elevated troponin, likely demand ischemia caused by hypoxia from #1 above along with cocaine use.  EKG and echo stable with preserved EF and no wall motion abnormality, chest pain-free,  continue to monitor.  Consult to quit doing cocaine and benzodiazepine abuse.    Transaminitis with mild downtrending elevation of lipase.  Likely due to shock liver caused by hypoxic respiratory failure hypotension and anoxia, trend is improving.  Symptom-free now.    Ileus induced abdominal pain on 01/15/2023.  Resolved after supportive care.  Feeling back to baseline.        Condition - Fair    Family Communication  :  None    Code Status :  Full    Consults  :  PCCM    PUD Prophylaxis :      Procedures  :       TTE -  1. Left ventricular ejection fraction, by estimation, is 60 to 65%. The left ventricle has normal function. The left ventricle has no regional wall motion abnormalities. Left ventricular diastolic parameters were normal.  2. Right ventricular systolic function is normal. The right ventricular size is normal.  3. The mitral valve is normal in structure. No  evidence of mitral valve regurgitation. No evidence of mitral stenosis.  4. The aortic valve is normal in structure. Aortic valve regurgitation is not visualized. No aortic stenosis is present.  5. The inferior vena cava is normal in size with greater than 50% respiratory variability, suggesting right atrial pressure of 3 mmHg.        Disposition Plan  :      Status is: Inpatient    DVT Prophylaxis  :      heparin injection 5,000 Units Start: 01/11/23 1400  SCDs Start: 01/10/23 1250      Lab Results   Component Value Date    PLT 235 01/16/2023     Diet :   Diet Order          Diet Heart Fluid consistency: Thin  Diet effective now                        Inpatient Medications    Scheduled Meds:   alteplase  2 mg Intracatheter Once    arformoterol  15 mcg Nebulization BID    budesonide (PULMICORT) nebulizer solution  0.25 mg Nebulization BID    calcium carbonate  1 tablet Oral BID    Chlorhexidine Gluconate Cloth  6 each Topical Daily    heparin injection (subcutaneous)  5,000 Units Subcutaneous Q8H    lamoTRIgine  100 mg Oral BID    levofloxacin  750  mg Oral Daily    mouth rinse  15 mL Mouth Rinse 4 times per day    senna  1 tablet Oral QHS     Continuous Infusions:   sodium chloride Stopped (01/10/23 2038)     PRN Meds:.acetaminophen, chlorpheniramine-HYDROcodone, clonazePAM, docusate, guaiFENesin, haloperidol lactate, melatonin, ondansetron (ZOFRAN) IV, mouth rinse, polyethylene glycol, polyvinyl alcohol, sodium chloride flush, traMADol     Objective:     Vitals:    01/16/23 0500 01/16/23 0752 01/16/23 0753 01/16/23 0800   BP:    105/60   Pulse:    84   Resp:    18   Temp:    97.9 F (36.6 C)   TempSrc:    Oral   SpO2:  91% (!) 86% (!) 86%   Weight: 82.4 kg      Height:         Wt Readings from Last 3 Encounters:   01/16/23 82.4 kg   12/11/21 74.8 kg   09/19/21 89.8 kg     Intake/Output Summary (Last 24 hours) at 01/16/2023 0805  Last data filed at 01/15/2023 2106  Gross per 24 hour   Intake 240 ml   Output 250 ml   Net -10 ml     Physical Exam    Awake Alert, No new F.N deficits, Normal affect, right arm PICC line  NC.AT,PERRAL  Supple Neck, No JVD,    Symmetrical Chest wall movement, Good air movement bilaterally, CTAB  RRR,No Gallops, Rubs or new Murmurs,   +ve B.Sounds, Abd Soft, No tenderness,    No Cyanosis, Clubbing or edema      Data Review:     Recent Labs   Lab 01/10/23  1138 01/10/23  1302 01/12/23  0116 01/13/23  0211 01/14/23  0500 01/15/23  0818 01/16/23  0328   WBC 2.7*   < > 13.6* 18.8* 12.8* 16.7* 16.2*   HGB 14.7   < > 11.5* 12.7* 11.9* 13.0 12.5*   HCT 46.6   < > 35.2* 39.4 35.3* 38.2* 37.2*  PLT 277   < > 188 171 174 216 235   MCV 93.8   < > 89.6 90.8 88.3 88.4 89.2   MCH 29.6   < > 29.3 29.3 29.8 30.1 30.0   MCHC 31.5   < > 32.7 32.2 33.7 34.0 33.6   RDW 14.3   < > 14.6 14.7 15.1 15.6* 15.7*   LYMPHSABS 0.6*  --   --   --  1.5  --  2.1   MONOABS 0.3  --   --   --  1.3*  --  1.0   EOSABS 0.0  --   --   --  0.0  --  0.0   BASOSABS 0.0  --   --   --  0.0  --  0.0    < > = values in this interval not displayed.     Recent Labs   Lab  01/10/23  1138 01/10/23  1139 01/10/23  1140 01/10/23  1302 01/10/23  1709 01/10/23  1711 01/11/23  0138 01/11/23  0358 01/12/23  0116 01/12/23  0922 01/13/23  0211 01/14/23  0500 01/15/23  0818 01/16/23  0328   NA 140  --   --    < > 139  --  134*   < > 139  --  139 139 137 138   K 4.1  --   --    < > 4.2  --  4.8   < > 4.3  --  3.4* 3.3* 3.3* 3.8   CL 99  --   --    < > 100  --  102  --  102  --  104 105 102 100   CO2 21*  --   --   --  23  --  21*  --  26  --  26 24 27 27    ANIONGAP 20*  --   --   --  16*  --  11  --  11  --  9 10 8 11    GLUCOSE 103*  --   --    < > 109*  --  104*  --  83  --  100* 101* 118* 116*   BUN 37*  --   --    < > 34*  --  35*  --  24*  --  17 19 20 20    CREATININE 3.86*  --   --    < > 3.08*  --  2.20*  --  1.31*  --  0.98 1.04 1.20 0.94   AST 2,316*  --   --   --   --   --   --   --   --   --  272* 151* 85* 59*   ALT 2,154*  --   --   --   --   --   --   --   --   --  732* 393* 296* 221*   ALKPHOS 73  --   --   --   --   --   --   --   --   --  89 95 94 87   BILITOT 0.7  --   --   --   --   --   --   --   --   --  1.3* 0.8 0.9 0.5   ALBUMIN 3.6  --   --   --   --   --   --   --   --   --  2.5* 2.2* 2.5* 2.3*   CRP  --   --   --   --   --   --   --   --   --   --   --   --  19.9* 11.4*   PROCALCITON  --   --   --   --  20.10  --   --   --   --   --   --   --  1.64  --    LATICACIDVEN  --   --  5.6*  --   --  4.5*  --   --   --  1.8  --   --   --   --    INR 1.3*  --   --   --   --   --   --   --   --   --   --   --   --  1.2   BNP  --  141.3*  --   --   --   --   --   --   --   --   --   --  21.1 14.5   MG  --   --   --   --   --   --  1.7  --  2.7*  --  2.1  --  2.2 2.2   CALCIUM 8.0*  --   --   --  7.4*  --  7.6*  --  8.6*  --  8.2* 8.1* 8.4* 8.8*    < > = values in this interval not displayed.         Radiology Reports  DG Abd Portable 1V    Result Date: 01/16/2023  CLINICAL DATA:  Nausea.  Mid abdominal pain. EXAM: PORTABLE ABDOMEN - 1 VIEW COMPARISON:  01/15/2023 FINDINGS: Similar  appearance of mild gaseous distension of the small and large bowel loops without signs to suggest high-grade bowel obstruction. No signs of pneumoperitoneum. Osseous structures are unremarkable. IMPRESSION: Similar appearance of mild gaseous distension of the small and large bowel loops without signs to suggest high-grade bowel obstruction. Findings may reflect ileus. Electronically Signed   By: Signa Kell M.D.   On: 01/16/2023 06:41     DG Abd Portable 1V    Result Date: 01/15/2023  CLINICAL DATA:  Abdominal pain EXAM: PORTABLE ABDOMEN - 1 VIEW COMPARISON:  None Available. FINDINGS: Mild distention of both small and large bowel loops. No supine evidence of free air. No acute osseous abnormality. IMPRESSION: Mild distention of both small and large bowel loops, which may represent ileus. Electronically Signed   By: Allegra Lai M.D.   On: 01/15/2023 18:22     DG Chest Port 1 View    Result Date: 01/15/2023  CLINICAL DATA:  Cough. EXAM: PORTABLE CHEST 1 VIEW COMPARISON:  Yesterday FINDINGS: Mild improvement in bilateral airspace disease which is more extensive on the right. Right PICC with tip at the upper cavoatrial junction. Stable heart size and mediastinal contours. No effusion or pneumothorax. IMPRESSION: Multifocal pneumonia with mildly improved aeration. Electronically Signed   By: Tiburcio Pea M.D.   On: 01/15/2023 06:19     ECHOCARDIOGRAM COMPLETE    Result Date: 01/14/2023     ECHOCARDIOGRAM REPORT   Patient Name:   Lucas Hicks Date of Exam: 01/14/2023 Medical Rec #:  [id number]    Height:       69.0 in Accession #:  [id number]   Weight:       182.5 lb Date of Birth:  03/20/1981    BSA:          1.987 m Patient Age:    41 years     BP:           123/97 mmHg Patient Gender: M            HR:           92 bpm. Exam Location:  Inpatient Procedure: 2D Echo, Color Doppler, Cardiac Doppler and Intracardiac            Opacification Agent Indications:    NSTEMI I21.4  History:        Patient has no prior  history of Echocardiogram examinations.                 NSTEMI, Substance Abuse; Risk Factors:Current Smoker.  Sonographer:    Aron Baba Referring Phys: [id number] Lorin Glass  Sonographer Comments: Suboptimal parasternal window. Image acquisition challenging due to respiratory motion. IMPRESSIONS  1. Left ventricular ejection fraction, by estimation, is 60 to 65%. The left ventricle has normal function. The left ventricle has no regional wall motion abnormalities. Left ventricular diastolic parameters were normal.  2. Right ventricular systolic function is normal. The right ventricular size is normal.  3. The mitral valve is normal in structure. No evidence of mitral valve regurgitation. No evidence of mitral stenosis.  4. The aortic valve is normal in structure. Aortic valve regurgitation is not visualized. No aortic stenosis is present.  5. The inferior vena cava is normal in size with greater than 50% respiratory variability, suggesting right atrial pressure of 3 mmHg. FINDINGS  Left Ventricle: Left ventricular ejection fraction, by estimation, is 60 to 65%. The left ventricle has normal function. The left ventricle has no regional wall motion abnormalities. Definity contrast agent was given IV to delineate the left ventricular  endocardial borders. The left ventricular internal cavity size was normal in size. There is no left ventricular hypertrophy. Left ventricular diastolic parameters were normal. Right Ventricle: The right ventricular size is normal. No increase in right ventricular wall thickness. Right ventricular systolic function is normal. Left Atrium: Left atrial size was normal in size. Right Atrium: Right atrial size was normal in size. Pericardium: There is no evidence of pericardial effusion. Mitral Valve: The mitral valve is normal in structure. No evidence of mitral valve regurgitation. No evidence of mitral valve stenosis. Tricuspid Valve: The tricuspid valve is normal in structure.  Tricuspid valve regurgitation is not demonstrated. No evidence of tricuspid stenosis. Aortic Valve: The aortic valve is normal in structure. Aortic valve regurgitation is not visualized. No aortic stenosis is present. Pulmonic Valve: The pulmonic valve was normal in structure. Pulmonic valve regurgitation is not visualized. No evidence of pulmonic stenosis. Aorta: The aortic root is normal in size and structure. Venous: The inferior vena cava is normal in size with greater than 50% respiratory variability, suggesting right atrial pressure of 3 mmHg. IAS/Shunts: No atrial level shunt detected by color flow Doppler.  LEFT VENTRICLE PLAX 2D LVIDd:         5.20 cm   Diastology LVIDs:         3.40 cm   LV e' medial:    10.10 cm/s LV PW:         1.00 cm   LV E/e' medial:  8.9 LV IVS:        1.00 cm  LV e' lateral:   15.00 cm/s LVOT diam:     1.90 cm   LV E/e' lateral: 6.0 LV SV:         62 LV SV Index:   31 LVOT Area:     2.84 cm  RIGHT VENTRICLE RV S prime:     13.10 cm/s TAPSE (M-mode): 1.6 cm LEFT ATRIUM           Index        RIGHT ATRIUM           Index LA diam:      3.20 cm 1.61 cm/m   RA Area:     14.90 cm LA Vol (A4C): 27.9 ml 14.04 ml/m  RA Volume:   41.70 ml  20.98 ml/m  AORTIC VALVE LVOT Vmax:   136.00 cm/s LVOT Vmean:  89.600 cm/s LVOT VTI:    0.218 m  AORTA Ao Root diam: 3.20 cm Ao Asc diam:  3.80 cm MITRAL VALVE MV Area (PHT): 3.93 cm    SHUNTS MV Decel Time: 193 msec    Systemic VTI:  0.22 m MV E velocity: 90.10 cm/s  Systemic Diam: 1.90 cm MV A velocity: 59.70 cm/s MV E/A ratio:  1.51 Charlton Haws MD Electronically signed by Charlton Haws MD Signature Date/Time: 01/14/2023/2:53:08 PM    Final      DG CHEST PORT 1 VIEW    Result Date: 01/14/2023  CLINICAL DATA:  Respiratory failure EXAM: PORTABLE CHEST 1 VIEW COMPARISON:  CXR 01/12/23 FINDINGS: Right arm PICC with the tip at the cavoatrial junction. There are persistent bilateral patchy airspace opacities, not significantly changed from prior exam, that  remain worrisome for infection. No pleural effusion. No pneumothorax. No radiographically apparent displaced rib fractures. Visualized upper abdomen is unremarkable. IMPRESSION: No significant change in bilateral patchy airspace opacities, which remain worrisome for infection. Improved bilateral pleural effusions. Electronically Signed   By: Lorenza Cambridge M.D.   On: 01/14/2023 07:53     DG Chest Port 1 View    Result Date: 01/12/2023  CLINICAL DATA:  Pneumonia. EXAM: PORTABLE CHEST 1 VIEW COMPARISON:  Radiograph yesterday. CT 01/10/2023 FINDINGS: Significant patient rotation on the current exam, lung volumes are low. Tip of the right upper extremity PICC at the atrial caval junction. Heterogeneous bilateral lung opacities with equivocal worsening from yesterday. Small bilateral pleural effusions. No pneumothorax. Stable heart size and mediastinal contours allowing for rotation and low lung volumes. IMPRESSION: 1. Equivocal worsening of bilateral lung opacities since yesterday. 2. Small bilateral pleural effusions. 3. Stable right upper extremity PICC. Electronically Signed   By: Narda Rutherford M.D.   On: 01/12/2023 11:33       Signature  -   Susa Raring M.D on 01/16/2023 at 8:05 AM   -  To page go to [email or web address]    Electronically signed by Leroy Sea, MD at 01/16/2023  8:06 AM EDT

## 2023-01-16 NOTE — Progress Notes (Signed)
Formatting of this note might be different from the original.  OT Cancellation Note    Patient Details  Name: Lucas Hicks  MRN: [id number]  DOB: [birth year]    Cancelled Treatment:    Reason Eval/Treat Not Completed: Pain limiting ability to participate. Pt c/o stomach pains, calling nurse for assist. OT will follow up next available time    Galen Manila  01/16/2023, 11:26 AM  Electronically signed by Lafe Garin, OT at 01/16/2023 11:26 AM EDT

## 2023-01-17 DIAGNOSIS — G929 Unspecified toxic encephalopathy: Secondary | ICD-10-CM | POA: Diagnosis not present

## 2023-01-17 LAB — CBC WITH DIFFERENTIAL/PLATELET
Abs Immature Granulocytes: 0.3 10*3/uL — ABNORMAL HIGH (ref 0.00–0.07)
Basophils Absolute: 0.2 10*3/uL — ABNORMAL HIGH (ref 0.0–0.1)
Basophils Relative: 1 %
Eosinophils Absolute: 0.2 10*3/uL (ref 0.0–0.5)
Eosinophils Relative: 1 %
HCT: 40.1 % (ref 39.0–52.0)
Hemoglobin: 13.5 g/dL (ref 13.0–17.0)
Lymphocytes Relative: 20 %
Lymphs Abs: 3.2 10*3/uL (ref 0.7–4.0)
MCH: 30 pg (ref 26.0–34.0)
MCHC: 33.7 g/dL (ref 30.0–36.0)
MCV: 89.1 fL (ref 80.0–100.0)
Monocytes Absolute: 0.6 10*3/uL (ref 0.1–1.0)
Monocytes Relative: 4 %
Myelocytes: 2 %
Neutro Abs: 11.5 10*3/uL — ABNORMAL HIGH (ref 1.7–7.7)
Neutrophils Relative %: 72 %
Platelets: 286 10*3/uL (ref 150–400)
RBC: 4.5 MIL/uL (ref 4.22–5.81)
RDW: 15.6 % — ABNORMAL HIGH (ref 11.5–15.5)
WBC: 16 10*3/uL — ABNORMAL HIGH (ref 4.0–10.5)
nRBC: 0 % (ref 0.0–0.2)
nRBC: 0 /100 WBC

## 2023-01-17 LAB — COMPREHENSIVE METABOLIC PANEL
ALT: 183 U/L — ABNORMAL HIGH (ref 0–44)
AST: 58 U/L — ABNORMAL HIGH (ref 15–41)
Albumin: 2.5 g/dL — ABNORMAL LOW (ref 3.5–5.0)
Alkaline Phosphatase: 94 U/L (ref 38–126)
Anion gap: 9 (ref 5–15)
BUN: 24 mg/dL — ABNORMAL HIGH (ref 6–20)
CO2: 27 mmol/L (ref 22–32)
Calcium: 8.9 mg/dL (ref 8.9–10.3)
Chloride: 99 mmol/L (ref 98–111)
Creatinine, Ser: 1.29 mg/dL — ABNORMAL HIGH (ref 0.61–1.24)
GFR, Estimated: >60 mL/min (ref >60)
Glucose, Bld: 112 mg/dL — ABNORMAL HIGH (ref 70–99)
Potassium: 3.7 mmol/L (ref 3.5–5.1)
Sodium: 135 mmol/L (ref 135–145)
Total Bilirubin: 0.8 mg/dL (ref 0.3–1.2)
Total Protein: 6.6 g/dL (ref 6.5–8.1)

## 2023-01-17 LAB — RESPIRATORY PANEL BY PCR

## 2023-01-17 LAB — BRAIN NATRIURETIC PEPTIDE: B Natriuretic Peptide: 9.2 pg/mL (ref 0.0–100.0)

## 2023-01-17 LAB — PROCALCITONIN: Procalcitonin: 0.42 ng/mL

## 2023-01-17 LAB — C-REACTIVE PROTEIN: CRP: 7.8 mg/dL — ABNORMAL HIGH (ref <1.0)

## 2023-01-17 MED ORDER — DICLOFENAC SODIUM 1 % EX GEL
2.0000 g | Freq: Four times a day (QID) | CUTANEOUS | Status: DC
  Administered 2023-01-17 – 2023-01-18 (×5): 2 g via TOPICAL
  Filled 2023-01-17: qty 100

## 2023-01-17 MED ORDER — KETOROLAC TROMETHAMINE 15 MG/ML IJ SOLN
15.0000 mg | Freq: Once | INTRAMUSCULAR | Status: AC | PRN
  Administered 2023-01-17: 15 mg via INTRAVENOUS
  Filled 2023-01-17: qty 1

## 2023-01-17 NOTE — Progress Notes (Signed)
Physical Therapy Treatment Patient Details Name: Randall Parsons MRN: [id number] DOB: [birth year] Today's Date: 01/17/2023   History of Present Illness 42 yo male admitted 4/5 after found unresponsive in hotel. Combative after narcan. Intubated for airway protection 4/5-4/6. Aspiration PNA with respiratory failure. PMHx: Polysubstance abuse, Hep C, IVDA, depression, ETOH, substance induced mood disorder    PT Comments    Pt admitted with above diagnosis. Scored 18/24 on DGI suggesting slight risk of falls however pt didn't have LOB that he couldn't self correct with challenges today.   Pt gets winded easily and was fatigued at end of walk.  Pt demonstrates safety in controlled environment with mobility. Will continue to progress balance and endurance. Pt currently with functional limitations due to balance and endurance deficits. Pt will benefit from acute skilled PT to increase their independence and safety with mobility to allow discharge.      Recommendations for follow up therapy are one component of a multi-disciplinary discharge planning process, led by the attending physician.  Recommendations may be updated based on patient status, additional functional criteria and insurance authorization.  Follow Up Recommendations       Assistance Recommended at Discharge PRN  Patient can return home with the following Assist for transportation   Equipment Recommendations  None recommended by PT    Recommendations for Other Services       Precautions / Restrictions Precautions Precautions: Fall Restrictions Weight Bearing Restrictions: No     Mobility  Bed Mobility               General bed mobility comments: On arrival, pt sitting in chair.  Once pt saw PT, pt got up and began to put pants and shoes on without assist.  Pt able to don pants while standing.  Able to don tennis shoes wtihout assist.    Transfers       Sit to Stand: Independent                 Ambulation/Gait       Gait Pattern/deviations: Step-through pattern, Decreased stride length, Drifts right/left       General Gait Details: Pt ambulated for 700 ft with min guard assist. Pt was unstable with and without challenges but was able to self correct.   Stairs       Number of Stairs: 10 (x2) General stair comments: Pt stated he was very winded after going up a flight of stairs. Pt needed to rest before descending stairs both attempts.  O2 98% after descending stairs.   Wheelchair Mobility    Modified Rankin (Stroke Patients Only)       Balance                                     Dynamic Gait Index Gait and Pivot Turn: Normal Step Around Obstacles: Normal Total Score: 18      Cognition                                                Exercises      General Comments General comments (skin integrity, edema, etc.): starting SpO2 100%, after stairs SpO2 97%. educated pt on respiratory concerns.  Reviewed incentive spirometer use with pt and had pt return demonstration.  Pertinent Vitals/Pain      Home Living                          Prior Function            PT Goals (current goals can now be found in the care plan section) Progress towards PT goals: Progressing toward goals    Frequency    Min 3X/week      PT Plan Current plan remains appropriate    Co-evaluation              AM-PAC PT "6 Clicks" Mobility   Outcome Measure  Help needed turning from your back to your side while in a flat bed without using bedrails?: None Help needed moving from lying on your back to sitting on the side of a flat bed without using bedrails?: None Help needed moving to and from a bed to a chair (including a wheelchair)?: None Help needed standing up from a chair using your arms (e.g., wheelchair or bedside chair)?: None Help needed to walk in hospital room?: None Help needed climbing 3-5 steps  with a railing? : A Little 6 Click Score: 23    End of Session Equipment Utilized During Treatment: Gait belt Activity Tolerance: Patient tolerated treatment well;Patient limited by fatigue Patient left: in bed (sitting on edge of bed) Nurse Communication: Mobility status;Other (comment) (Pt requested breathing treatment) PT Visit Diagnosis: Other abnormalities of gait and mobility (R26.89)     Time: 4315-4008 PT Time Calculation (min) (ACUTE ONLY): 27 min  Charges:  $Gait Training: 23-37 mins                     Pihu Basil M,PT Acute Rehab Services [phone]    Bevelyn Buckles 01/17/2023, 3:33 PM

## 2023-01-17 NOTE — Progress Notes (Signed)
PROGRESS NOTE                                                                                                                                                                                                             Patient Demographics:    Randall Parsons, is a 42 y.o. male, DOB - [birth year], ZHY:[id number]  Outpatient Primary MD for the patient is [facility]    LOS - 7  Admit date - 01/10/2023    CC - SOB      Brief Narrative (HPI from H&P)   42 year old male patient with history Polysubstance abuse, Hep C, IVDA, depression, ETOH, substance induced mood disorder, he was found unresponsive at the hotel and was brought to the ER unresponsive apneic with pinpoint pupils presumed to have drug overdose, he was found to have pneumonia along with acute hypoxic respiratory failure he was intubated admitted to ICU, stabilized transferred to hospitalist service on 01/15/2023 on day 5 of his hospital stay.   Subjective:   Patient in bed, appears comfortable, denies any headache, no fever, no chest pain or pressure, no shortness of breath , no abdominal pain. No new focal weakness.    Assessment  & Plan :   Acute hypoxic respiratory failure secondary to aspiration pneumonia, pulmonary edema, NSTEMI, in the setting of acute toxic encephalopathy caused by recreational drug overdose, history of IVDA in the past. He was admitted to the ICU intubated for 24 hours, started on antibiotics, overall improving, currently on nasal cannula oxygen still completing his antibiotic treatment, tapered off oral steroids, counseled to abstain from all drug use.  Advance activity, encouraged to sit up in chair use I-S and flutter valve for pulmonary toiletry, continue to monitor.  CTA noted on 01/17/2023 with continued bilateral infiltrates.  Much stable on exam, still needs titration of his oxygen down.  History of positive hep C.  Outpatient  follow-up with GI and ID.  Elevated troponin, likely demand ischemia caused by hypoxia from #1 above along with cocaine use.  EKG and echo stable with preserved EF and no wall motion abnormality, chest pain-free, continue to monitor.  Consult to quit doing cocaine and benzodiazepine abuse.  Transaminitis with mild downtrending elevation of lipase.  Likely due to shock liver caused by hypoxic respiratory failure hypotension and anoxia, trend is improving.  Symptom-free  now.  Ileus induced abdominal pain on 01/15/2023.  Resolved after supportive care.  Feeling back to baseline.       Condition - Fair  Family Communication  :  None  Code Status :  Full  Consults  :  PCCM  PUD Prophylaxis :    Procedures  :     TTE -  1. Left ventricular ejection fraction, by estimation, is 60 to 65%. The left ventricle has normal function. The left ventricle has no regional wall motion abnormalities. Left ventricular diastolic parameters were normal.  2. Right ventricular systolic function is normal. The right ventricular size is normal.  3. The mitral valve is normal in structure. No evidence of mitral valve regurgitation. No evidence of mitral stenosis.  4. The aortic valve is normal in structure. Aortic valve regurgitation is not visualized. No aortic stenosis is present.  5. The inferior vena cava is normal in size with greater than 50% respiratory variability, suggesting right atrial pressure of 3 mmHg.      Disposition Plan  :    Status is: Inpatient  DVT Prophylaxis  :    heparin injection 5,000 Units Start: 01/11/23 1400 SCDs Start: 01/10/23 1250    Lab Results  Component Value Date   PLT 286 01/17/2023    Diet :  Diet Order             Diet Heart Fluid consistency: Thin  Diet effective now                    Inpatient Medications  Scheduled Meds:  alteplase  2 mg Intracatheter Once   arformoterol  15 mcg Nebulization BID   budesonide (PULMICORT) nebulizer solution   0.25 mg Nebulization BID   calcium carbonate  1 tablet Oral BID   carbamide peroxide  5 drop Left EAR BID   Chlorhexidine Gluconate Cloth  6 each Topical Daily   heparin injection (subcutaneous)  5,000 Units Subcutaneous Q8H   lamoTRIgine  100 mg Oral BID   levofloxacin  750 mg Oral Daily   mouth rinse  15 mL Mouth Rinse 4 times per day   senna  1 tablet Oral QHS   Continuous Infusions:  sodium chloride Stopped (01/10/23 2038)   PRN Meds:.acetaminophen, chlorpheniramine-HYDROcodone, clonazePAM, docusate, guaiFENesin, haloperidol lactate, melatonin, ondansetron (ZOFRAN) IV, mouth rinse, polyethylene glycol, polyvinyl alcohol, simethicone, sodium chloride flush, traMADol     Objective:   Vitals:   01/16/23 1959 01/17/23 0015 01/17/23 0330 01/17/23 0617  BP: 115/77 118/75 120/77   Pulse: 83 81 73 74  Resp: (!) 24 (!) 23 (!) 21 18  Temp: 98.5 F (36.9 C) 98.1 F (36.7 C) 98 F (36.7 C)   TempSrc: Oral Oral Oral   SpO2: 96% 90% 98% 96%  Weight:      Height:        Wt Readings from Last 3 Encounters:  01/16/23 82.4 kg  12/11/21 74.8 kg  09/19/21 89.8 kg     Intake/Output Summary (Last 24 hours) at 01/17/2023 0741 Last data filed at 01/17/2023 0300 Gross per 24 hour  Intake 1180 ml  Output 2100 ml  Net -920 ml     Physical Exam  Awake Alert, No new F.N deficits, Normal affect, right arm PICC line [place].AT,PERRAL Supple Neck, No JVD,   Symmetrical Chest wall movement, Good air movement bilaterally, few rales RRR,No Gallops, Rubs or new Murmurs,  +ve B.Sounds, Abd Soft, No tenderness,   No Cyanosis, Clubbing or edema  Data Review:    Recent Labs  Lab 01/10/23 1138 01/10/23 1302 01/13/23 0211 01/14/23 0500 01/15/23 0818 01/16/23 0328 01/17/23 0319  WBC 2.7*   < > 18.8* 12.8* 16.7* 16.2* 16.0*  HGB 14.7   < > 12.7* 11.9* 13.0 12.5* 13.5  HCT 46.6   < > 39.4 35.3* 38.2* 37.2* 40.1  PLT 277   < > 171 174 216 235 286  MCV 93.8   < > 90.8 88.3 88.4 89.2 89.1   MCH 29.6   < > 29.3 29.8 30.1 30.0 30.0  MCHC 31.5   < > 32.2 33.7 34.0 33.6 33.7  RDW 14.3   < > 14.7 15.1 15.6* 15.7* 15.6*  LYMPHSABS 0.6*  --   --  1.5  --  2.1 3.2  MONOABS 0.3  --   --  1.3*  --  1.0 0.6  EOSABS 0.0  --   --  0.0  --  0.0 0.2  BASOSABS 0.0  --   --  0.0  --  0.0 0.2*   < > = values in this interval not displayed.    Recent Labs  Lab 01/10/23 1138 01/10/23 1139 01/10/23 1140 01/10/23 1302 01/10/23 1709 01/10/23 1711 01/11/23 0138 01/11/23 0358 01/12/23 0116 01/12/23 0922 01/13/23 0211 01/14/23 0500 01/15/23 0818 01/16/23 0328 01/16/23 2107 01/17/23 0319  NA 140  --   --    < > 139  --  134*   < > 139  --  139 139 137 138  --  135  K 4.1  --   --    < > 4.2  --  4.8   < > 4.3  --  3.4* 3.3* 3.3* 3.8  --  3.7  CL 99  --   --    < > 100  --  102  --  102  --  104 105 102 100  --  99  CO2 21*  --   --   --  23  --  21*  --  26  --  26 24 27 27   --  27  ANIONGAP 20*  --   --   --  16*  --  11  --  11  --  9 10 8 11   --  9  GLUCOSE 103*  --   --    < > 109*  --  104*  --  83  --  100* 101* 118* 116*  --  112*  BUN 37*  --   --    < > 34*  --  35*  --  24*  --  17 19 20 20   --  24*  CREATININE 3.86*  --   --    < > 3.08*  --  2.20*  --  1.31*  --  0.98 1.04 1.20 0.94  --  1.29*  AST 2,316*  --   --   --   --   --   --   --   --   --  272* 151* 85* 59*  --  58*  ALT 2,154*  --   --   --   --   --   --   --   --   --  732* 393* 296* 221*  --  183*  ALKPHOS 73  --   --   --   --   --   --   --   --   --  89 95 94 87  --  94  BILITOT 0.7  --   --   --   --   --   --   --   --   --  1.3* 0.8 0.9 0.5  --  0.8  ALBUMIN 3.6  --   --   --   --   --   --   --   --   --  2.5* 2.2* 2.5* 2.3*  --  2.5*  CRP  --   --   --   --   --   --   --   --   --   --   --   --  19.9* 11.4*  --  7.8*  PROCALCITON  --   --   --   --  20.10  --   --   --   --   --   --   --  1.64  --  0.50 0.42  LATICACIDVEN  --   --  5.6*  --   --  4.5*  --   --   --  1.8  --   --   --   --   --   --    INR 1.3*  --   --   --   --   --   --   --   --   --   --   --   --  1.2  --   --   BNP  --  141.3*  --   --   --   --   --   --   --   --   --   --  21.1 14.5  --  9.2  MG  --   --   --   --   --   --  1.7  --  2.7*  --  2.1  --  2.2 2.2  --   --   CALCIUM 8.0*  --   --   --  7.4*  --  7.6*  --  8.6*  --  8.2* 8.1* 8.4* 8.8*  --  8.9   < > = values in this interval not displayed.        Radiology Reports CT CHEST ABDOMEN PELVIS W CONTRAST  Result Date: 01/16/2023 CLINICAL DATA:  Sepsis. EXAM: CT CHEST, ABDOMEN, AND PELVIS WITH CONTRAST TECHNIQUE: Multidetector CT imaging of the chest, abdomen and pelvis was performed following the standard protocol during bolus administration of intravenous contrast. RADIATION DOSE REDUCTION: This exam was performed according to the departmental dose-optimization program which includes automated exposure control, adjustment of the mA and/or kV according to patient size and/or use of iterative reconstruction technique. CONTRAST:  75mL OMNIPAQUE IOHEXOL 350 MG/ML SOLN COMPARISON:  January 10, 2023 FINDINGS: CT CHEST FINDINGS Cardiovascular: No significant vascular findings. Normal heart size. No pericardial effusion. Right PICC line terminates at the cavoatrial junction. Mediastinum/Nodes: No enlarged mediastinal, hilar, or axillary lymph nodes. Thyroid gland, trachea, and esophagus demonstrate no significant findings. Lungs/Pleura: Persistent widespread bilateral multifocal airspace consolidation with central and lower lobe predominance. When compared to the prior exam, there is mild improvement in the aeration of the lower lobes. No pleural effusion. Musculoskeletal: No chest wall mass or suspicious bone lesions identified. CT ABDOMEN PELVIS FINDINGS Hepatobiliary: No focal liver abnormality is seen. No gallstones, gallbladder wall thickening, or biliary dilatation. Pancreas: Unremarkable. No pancreatic ductal dilatation or surrounding inflammatory changes. Spleen:  Normal in size without focal abnormality. Adrenals/Urinary Tract: Adrenal glands are unremarkable. Kidneys are without focal lesion, or hydronephrosis. Bilateral few mm nonobstructive nephrolithiasis. The largest calculus in the right kidney measures 3 mm. Bladder is unremarkable. Stomach/Bowel: Stomach is within normal limits. No evidence of bowel wall thickening, distention, or inflammatory changes. Vascular/Lymphatic: No significant vascular findings are present. No enlarged abdominal or pelvic lymph nodes. Reproductive: Prostate is unremarkable. Other: No abdominal wall hernia or abnormality. No abdominopelvic ascites. Musculoskeletal: No acute or significant osseous findings. IMPRESSION: Persistent bilateral multifocal pneumonia with mild improvement in the aeration of the lower lobes. Bilateral nonobstructive nephrolithiasis. Electronically Signed   By: Ted Mcalpine M.D.   On: 01/16/2023 12:49   DG Chest 2 View  Result Date: 01/16/2023 CLINICAL DATA:  Pneumonia EXAM: CHEST - 2 VIEW COMPARISON:  01/15/2023 FINDINGS: Increased asymmetric mixed interstitial and airspace opacities, worse in the right upper lobe but present bilaterally compatible with worsening bilateral pneumonia pattern. Stable heart size and vascularity. No effusion or pneumothorax. Trachea midline. Right PICC line tip SVC RA junction. IMPRESSION: Worsening bilateral multifocal pneumonia pattern, compared with yesterday. Electronically Signed   By: Judie Petit.  Shick M.D.   On: 01/16/2023 09:55   DG Abd Portable 1V  Result Date: 01/16/2023 CLINICAL DATA:  Nausea.  Mid abdominal pain. EXAM: PORTABLE ABDOMEN - 1 VIEW COMPARISON:  01/15/2023 FINDINGS: Similar appearance of mild gaseous distension of the small and large bowel loops without signs to suggest high-grade bowel obstruction. No signs of pneumoperitoneum. Osseous structures are unremarkable. IMPRESSION: Similar appearance of mild gaseous distension of the small and large bowel loops  without signs to suggest high-grade bowel obstruction. Findings may reflect ileus. Electronically Signed   By: Signa Kell M.D.   On: 01/16/2023 06:41   DG Abd Portable 1V  Result Date: 01/15/2023 CLINICAL DATA:  Abdominal pain EXAM: PORTABLE ABDOMEN - 1 VIEW COMPARISON:  None Available. FINDINGS: Mild distention of both small and large bowel loops. No supine evidence of free air. No acute osseous abnormality. IMPRESSION: Mild distention of both small and large bowel loops, which may represent ileus. Electronically Signed   By: Allegra Lai M.D.   On: 01/15/2023 18:22   DG Chest Port 1 View  Result Date: 01/15/2023 CLINICAL DATA:  Cough. EXAM: PORTABLE CHEST 1 VIEW COMPARISON:  Yesterday FINDINGS: Mild improvement in bilateral airspace disease which is more extensive on the right. Right PICC with tip at the upper cavoatrial junction. Stable heart size and mediastinal contours. No effusion or pneumothorax. IMPRESSION: Multifocal pneumonia with mildly improved aeration. Electronically Signed   By: Tiburcio Pea M.D.   On: 01/15/2023 06:19   ECHOCARDIOGRAM COMPLETE  Result Date: 01/14/2023    ECHOCARDIOGRAM REPORT   Patient Name:   VYNCENT OVERBY Date of Exam: 01/14/2023 Medical Rec #:  [id number]    Height:       69.0 in Accession #:    [id number]   Weight:       182.5 lb Date of Birth:  07-10-1981    BSA:          1.987 m Patient Age:    41 years     BP:           123/97 mmHg Patient Gender: M            HR:           92 bpm. Exam Location:  Inpatient Procedure: 2D Echo, Color Doppler, Cardiac Doppler and Intracardiac  Opacification Agent Indications:    NSTEMI I21.4  History:        Patient has no prior history of Echocardiogram examinations.                 NSTEMI, Substance Abuse; Risk Factors:Current Smoker.  Sonographer:    Aron Baba Referring Phys: [id number] Lorin Glass  Sonographer Comments: Suboptimal parasternal window. Image acquisition challenging due to respiratory motion.  IMPRESSIONS  1. Left ventricular ejection fraction, by estimation, is 60 to 65%. The left ventricle has normal function. The left ventricle has no regional wall motion abnormalities. Left ventricular diastolic parameters were normal.  2. Right ventricular systolic function is normal. The right ventricular size is normal.  3. The mitral valve is normal in structure. No evidence of mitral valve regurgitation. No evidence of mitral stenosis.  4. The aortic valve is normal in structure. Aortic valve regurgitation is not visualized. No aortic stenosis is present.  5. The inferior vena cava is normal in size with greater than 50% respiratory variability, suggesting right atrial pressure of 3 mmHg. FINDINGS  Left Ventricle: Left ventricular ejection fraction, by estimation, is 60 to 65%. The left ventricle has normal function. The left ventricle has no regional wall motion abnormalities. Definity contrast agent was given IV to delineate the left ventricular  endocardial borders. The left ventricular internal cavity size was normal in size. There is no left ventricular hypertrophy. Left ventricular diastolic parameters were normal. Right Ventricle: The right ventricular size is normal. No increase in right ventricular wall thickness. Right ventricular systolic function is normal. Left Atrium: Left atrial size was normal in size. Right Atrium: Right atrial size was normal in size. Pericardium: There is no evidence of pericardial effusion. Mitral Valve: The mitral valve is normal in structure. No evidence of mitral valve regurgitation. No evidence of mitral valve stenosis. Tricuspid Valve: The tricuspid valve is normal in structure. Tricuspid valve regurgitation is not demonstrated. No evidence of tricuspid stenosis. Aortic Valve: The aortic valve is normal in structure. Aortic valve regurgitation is not visualized. No aortic stenosis is present. Pulmonic Valve: The pulmonic valve was normal in structure. Pulmonic valve  regurgitation is not visualized. No evidence of pulmonic stenosis. Aorta: The aortic root is normal in size and structure. Venous: The inferior vena cava is normal in size with greater than 50% respiratory variability, suggesting right atrial pressure of 3 mmHg. IAS/Shunts: No atrial level shunt detected by color flow Doppler.  LEFT VENTRICLE PLAX 2D LVIDd:         5.20 cm   Diastology LVIDs:         3.40 cm   LV e' medial:    10.10 cm/s LV PW:         1.00 cm   LV E/e' medial:  8.9 LV IVS:        1.00 cm   LV e' lateral:   15.00 cm/s LVOT diam:     1.90 cm   LV E/e' lateral: 6.0 LV SV:         62 LV SV Index:   31 LVOT Area:     2.84 cm  RIGHT VENTRICLE RV S prime:     13.10 cm/s TAPSE (M-mode): 1.6 cm LEFT ATRIUM           Index        RIGHT ATRIUM           Index LA diam:      3.20 cm 1.61 cm/m  RA Area:     14.90 cm LA Vol (A4C): 27.9 ml 14.04 ml/m  RA Volume:   41.70 ml  20.98 ml/m  AORTIC VALVE LVOT Vmax:   136.00 cm/s LVOT Vmean:  89.600 cm/s LVOT VTI:    0.218 m  AORTA Ao Root diam: 3.20 cm Ao Asc diam:  3.80 cm MITRAL VALVE MV Area (PHT): 3.93 cm    SHUNTS MV Decel Time: 193 msec    Systemic VTI:  0.22 m MV E velocity: 90.10 cm/s  Systemic Diam: 1.90 cm MV A velocity: 59.70 cm/s MV E/A ratio:  1.51 Charlton Haws MD Electronically signed by Charlton Haws MD Signature Date/Time: 01/14/2023/2:53:08 PM    Final    DG CHEST PORT 1 VIEW  Result Date: 01/14/2023 CLINICAL DATA:  Respiratory failure EXAM: PORTABLE CHEST 1 VIEW COMPARISON:  CXR 01/12/23 FINDINGS: Right arm PICC with the tip at the cavoatrial junction. There are persistent bilateral patchy airspace opacities, not significantly changed from prior exam, that remain worrisome for infection. No pleural effusion. No pneumothorax. No radiographically apparent displaced rib fractures. Visualized upper abdomen is unremarkable. IMPRESSION: No significant change in bilateral patchy airspace opacities, which remain worrisome for infection. Improved bilateral  pleural effusions. Electronically Signed   By: Lorenza Cambridge M.D.   On: 01/14/2023 07:53      Signature  -   Susa Raring M.D on 01/17/2023 at 7:41 AM   -  To page go to [email or web address]

## 2023-01-17 NOTE — Progress Notes (Signed)
Occupational Therapy Treatment Patient Details Name: Randall Parsons MRN: [id number] DOB: Jun 26, 1981 Today's Date: 01/17/2023   History of present illness 42 yo male admitted 4/5 after found unresponsive in hotel. Combative after narcan. Intubated for airway protection 4/5-4/6. Aspiration PNA with respiratory failure. PMHx: Polysubstance abuse, Hep C, IVDA, depression, ETOH, substance induced mood disorder   OT comments  Pt making good progress with functional goals. Pt reports that he feels much better and that he is going home tomorrow. Pt safely transferred on and off toilet and in and out of shower without physical assist, no difficulty or LOB. OT will continue to follow acutely to maximize level of function and safety   Recommendations for follow up therapy are one component of a multi-disciplinary discharge planning process, led by the attending physician.  Recommendations may be updated based on patient status, additional functional criteria and insurance authorization.    Assistance Recommended at Discharge Set up Supervision/Assistance  Patient can return home with the following  A little help with bathing/dressing/bathroom;Assistance with cooking/housework;Direct supervision/assist for medications management;Assist for transportation;Help with stairs or ramp for entrance   Equipment Recommendations       Recommendations for Other Services      Precautions / Restrictions Precautions Precautions: Fall Restrictions Weight Bearing Restrictions: No       Mobility Bed Mobility               General bed mobility comments: pt sitting in recliner upon arrival    Transfers Overall transfer level: Needs assistance Equipment used: None, 1 person hand held assist Transfers: Sit to/from Stand Sit to Stand: Supervision, Modified independent (Device/Increase time)           General transfer comment: Sup for safety, pt transferred on and off toilet and in and out of shower  without physical assist, no difficulty or LOB     Balance Overall balance assessment: Mild deficits observed, not formally tested Sitting-balance support: Feet supported Sitting balance-Leahy Scale: Good     Standing balance support: Bilateral upper extremity supported, During functional activity Standing balance-Leahy Scale: Fair                             ADL either performed or assessed with clinical judgement   ADL Overall ADL's : Needs assistance/impaired     Grooming: Wash/dry hands;Wash/dry face;Modified independent               Lower Body Dressing: Supervision/safety;Modified independent   Toilet Transfer: Supervision/safety;Ambulation   Toileting- Clothing Manipulation and Hygiene: Modified independent   Tub/ Shower Transfer: Supervision/safety;Ambulation   Functional mobility during ADLs: Supervision/safety General ADL Comments: Pt with improved activity tolerance and strength    Extremity/Trunk Assessment Upper Extremity Assessment Upper Extremity Assessment: Overall WFL for tasks assessed   Lower Extremity Assessment Lower Extremity Assessment: Defer to PT evaluation        Vision Patient Visual Report: No change from baseline     Perception     Praxis      Cognition Arousal/Alertness: Awake/alert Behavior During Therapy: WFL for tasks assessed/performed Overall Cognitive Status: Within Functional Limits for tasks assessed                                          Exercises      Shoulder Instructions       General Comments  Pertinent Vitals/ Pain       Pain Assessment Pain Assessment: 0-10 Pain Score: 2  Pain Location: L shoulder with movement Pain Descriptors / Indicators: Discomfort, Sore Pain Intervention(s): Monitored during session, Repositioned  Home Living                                          Prior Functioning/Environment              Frequency  Min 2X/week         Progress Toward Goals  OT Goals(current goals can now be found in the care plan section)  Progress towards OT goals: Progressing toward goals     Plan Discharge plan remains appropriate    Co-evaluation                 AM-PAC OT "6 Clicks" Daily Activity     Outcome Measure   Help from another person eating meals?: None Help from another person taking care of personal grooming?: None Help from another person toileting, which includes using toliet, bedpan, or urinal?: None Help from another person bathing (including washing, rinsing, drying)?: A Little Help from another person to put on and taking off regular upper body clothing?: None Help from another person to put on and taking off regular lower body clothing?: A Little 6 Click Score: 22    End of Session    OT Visit Diagnosis: Unsteadiness on feet (R26.81);Muscle weakness (generalized) (M62.81)   Activity Tolerance Patient tolerated treatment well   Patient Left in chair;with call bell/phone within reach   Nurse Communication          Time: 5701-7793 OT Time Calculation (min): 12 min  Charges: OT General Charges $OT Visit: 1 Visit OT Treatments $Therapeutic Activity: 8-22 mins    Galen Manila 01/17/2023, 1:31 PM

## 2023-01-17 NOTE — Progress Notes (Signed)
Formatting of this note is different from the original.  Images from the original note were not included.                                      PROGRESS NOTE                                                     Patient Demographics:      Lucas Hicks, is a 42 y.o. male, DOB - 1981-04-08, ZOX:[id number]    Outpatient Primary MD for the patient is [facility]    LOS - 7  Admit date - 01/10/2023      CC - SOB      Brief Narrative (HPI from H&P)   42 year old male patient with history Polysubstance abuse, Hep C, IVDA, depression, ETOH, substance induced mood disorder, he was found unresponsive at the hotel and was brought to the ER unresponsive apneic with pinpoint pupils presumed to have drug overdose, he was found to have pneumonia along with acute hypoxic respiratory failure he was intubated admitted to ICU, stabilized transferred to hospitalist service on 01/15/2023 on day 5 of his hospital stay.     Subjective:     Patient in bed, appears comfortable, denies any headache, no fever, no chest pain or pressure, no shortness of breath , no abdominal pain. No new focal weakness.     Assessment  & Plan :     Acute hypoxic respiratory failure secondary to aspiration pneumonia, pulmonary edema, NSTEMI, in the setting of acute toxic encephalopathy caused by recreational drug overdose, history of IVDA in the past. He was admitted to the ICU intubated for 24 hours, started on antibiotics, overall improving, currently on nasal cannula oxygen still completing his antibiotic treatment, tapered off oral steroids, counseled to abstain from all drug use.  Advance activity, encouraged to sit up in chair use I-S and flutter valve for pulmonary toiletry, continue to monitor.  CTA noted on 01/17/2023 with continued bilateral infiltrates.  Much stable on exam, still needs titration of his oxygen down.    History of positive hep C.  Outpatient follow-up with GI and ID.    Elevated troponin, likely demand ischemia caused by hypoxia  from #1 above along with cocaine use.  EKG and echo stable with preserved EF and no wall motion abnormality, chest pain-free, continue to monitor.  Consult to quit doing cocaine and benzodiazepine abuse.    Transaminitis with mild downtrending elevation of lipase.  Likely due to shock liver caused by hypoxic respiratory failure hypotension and anoxia, trend is improving.  Symptom-free now.    Ileus induced abdominal pain on 01/15/2023.  Resolved after supportive care.  Feeling back to baseline.        Condition - Fair    Family Communication  :  None    Code Status :  Full    Consults  :  PCCM    PUD Prophylaxis :      Procedures  :       TTE -  1. Left ventricular ejection fraction, by estimation, is 60 to 65%. The left ventricle has normal function. The left ventricle has no regional wall motion abnormalities. Left ventricular diastolic parameters were normal.  2.  Right ventricular systolic function is normal. The right ventricular size is normal.  3. The mitral valve is normal in structure. No evidence of mitral valve regurgitation. No evidence of mitral stenosis.  4. The aortic valve is normal in structure. Aortic valve regurgitation is not visualized. No aortic stenosis is present.  5. The inferior vena cava is normal in size with greater than 50% respiratory variability, suggesting right atrial pressure of 3 mmHg.        Disposition Plan  :      Status is: Inpatient    DVT Prophylaxis  :      heparin injection 5,000 Units Start: 01/11/23 1400  SCDs Start: 01/10/23 1250      Lab Results   Component Value Date    PLT 286 01/17/2023     Diet :   Diet Order          Diet Heart Fluid consistency: Thin  Diet effective now                        Inpatient Medications    Scheduled Meds:   alteplase  2 mg Intracatheter Once    arformoterol  15 mcg Nebulization BID    budesonide (PULMICORT) nebulizer solution  0.25 mg Nebulization BID    calcium carbonate  1 tablet Oral BID    carbamide peroxide  5 drop Left EAR BID     Chlorhexidine Gluconate Cloth  6 each Topical Daily    heparin injection (subcutaneous)  5,000 Units Subcutaneous Q8H    lamoTRIgine  100 mg Oral BID    levofloxacin  750 mg Oral Daily    mouth rinse  15 mL Mouth Rinse 4 times per day    senna  1 tablet Oral QHS     Continuous Infusions:   sodium chloride Stopped (01/10/23 2038)     PRN Meds:.acetaminophen, chlorpheniramine-HYDROcodone, clonazePAM, docusate, guaiFENesin, haloperidol lactate, melatonin, ondansetron (ZOFRAN) IV, mouth rinse, polyethylene glycol, polyvinyl alcohol, simethicone, sodium chloride flush, traMADol     Objective:     Vitals:    01/16/23 1959 01/17/23 0015 01/17/23 0330 01/17/23 0617   BP: 115/77 118/75 120/77    Pulse: 83 81 73 74   Resp: (!) 24 (!) 23 (!) 21 18   Temp: 98.5 F (36.9 C) 98.1 F (36.7 C) 98 F (36.7 C)    TempSrc: Oral Oral Oral    SpO2: 96% 90% 98% 96%   Weight:       Height:         Wt Readings from Last 3 Encounters:   01/16/23 82.4 kg   12/11/21 74.8 kg   09/19/21 89.8 kg     Intake/Output Summary (Last 24 hours) at 01/17/2023 0741  Last data filed at 01/17/2023 0300  Gross per 24 hour   Intake 1180 ml   Output 2100 ml   Net -920 ml     Physical Exam    Awake Alert, No new F.N deficits, Normal affect, right arm PICC line  NC.AT,PERRAL  Supple Neck, No JVD,    Symmetrical Chest wall movement, Good air movement bilaterally, few rales  RRR,No Gallops, Rubs or new Murmurs,   +ve B.Sounds, Abd Soft, No tenderness,    No Cyanosis, Clubbing or edema      Data Review:     Recent Labs   Lab 01/10/23  1138 01/10/23  1302 01/13/23  0211 01/14/23  0500 01/15/23  0818 01/16/23  0328 01/17/23  0319   WBC 2.7*   < > 18.8* 12.8* 16.7* 16.2* 16.0*   HGB 14.7   < > 12.7* 11.9* 13.0 12.5* 13.5   HCT 46.6   < > 39.4 35.3* 38.2* 37.2* 40.1   PLT 277   < > 171 174 216 235 286   MCV 93.8   < > 90.8 88.3 88.4 89.2 89.1   MCH 29.6   < > 29.3 29.8 30.1 30.0 30.0   MCHC 31.5   < > 32.2 33.7 34.0 33.6 33.7   RDW 14.3   < > 14.7 15.1 15.6* 15.7*  15.6*   LYMPHSABS 0.6*  --   --  1.5  --  2.1 3.2   MONOABS 0.3  --   --  1.3*  --  1.0 0.6   EOSABS 0.0  --   --  0.0  --  0.0 0.2   BASOSABS 0.0  --   --  0.0  --  0.0 0.2*    < > = values in this interval not displayed.     Recent Labs   Lab 01/10/23  1138 01/10/23  1139 01/10/23  1140 01/10/23  1302 01/10/23  1709 01/10/23  1711 01/11/23  0138 01/11/23  0358 01/12/23  0116 01/12/23  0922 01/13/23  0211 01/14/23  0500 01/15/23  0818 01/16/23  0328 01/16/23  2107 01/17/23  0319   NA 140  --   --    < > 139  --  134*   < > 139  --  139 139 137 138  --  135   K 4.1  --   --    < > 4.2  --  4.8   < > 4.3  --  3.4* 3.3* 3.3* 3.8  --  3.7   CL 99  --   --    < > 100  --  102  --  102  --  104 105 102 100  --  99   CO2 21*  --   --   --  23  --  21*  --  26  --  26 24 27 27   --  27   ANIONGAP 20*  --   --   --  16*  --  11  --  11  --  9 10 8 11   --  9   GLUCOSE 103*  --   --    < > 109*  --  104*  --  83  --  100* 101* 118* 116*  --  112*   BUN 37*  --   --    < > 34*  --  35*  --  24*  --  17 19 20 20   --  24*   CREATININE 3.86*  --   --    < > 3.08*  --  2.20*  --  1.31*  --  0.98 1.04 1.20 0.94  --  1.29*   AST 2,316*  --   --   --   --   --   --   --   --   --  272* 151* 85* 59*  --  58*   ALT 2,154*  --   --   --   --   --   --   --   --   --  732* 393* 296* 221*  --  183*   ALKPHOS 73  --   --   --   --   --   --   --   --   --  89 95 94 87  --  94   BILITOT 0.7  --   --   --   --   --   --   --   --   --  1.3* 0.8 0.9 0.5  --  0.8   ALBUMIN 3.6  --   --   --   --   --   --   --   --   --  2.5* 2.2* 2.5* 2.3*  --  2.5*   CRP  --   --   --   --   --   --   --   --   --   --   --   --  19.9* 11.4*  --  7.8*   PROCALCITON  --   --   --   --  20.10  --   --   --   --   --   --   --  1.64  --  0.50 0.42   LATICACIDVEN  --   --  5.6*  --   --  4.5*  --   --   --  1.8  --   --   --   --   --   --    INR 1.3*  --   --   --   --   --   --   --   --   --   --   --   --  1.2  --   --    BNP  --  141.3*  --   --   --   --    --   --   --   --   --   --  21.1 14.5  --  9.2   MG  --   --   --   --   --   --  1.7  --  2.7*  --  2.1  --  2.2 2.2  --   --    CALCIUM 8.0*  --   --   --  7.4*  --  7.6*  --  8.6*  --  8.2* 8.1* 8.4* 8.8*  --  8.9    < > = values in this interval not displayed.         Radiology Reports  CT CHEST ABDOMEN PELVIS W CONTRAST    Result Date: 01/16/2023  CLINICAL DATA:  Sepsis. EXAM: CT CHEST, ABDOMEN, AND PELVIS WITH CONTRAST TECHNIQUE: Multidetector CT imaging of the chest, abdomen and pelvis was performed following the standard protocol during bolus administration of intravenous contrast. RADIATION DOSE REDUCTION: This exam was performed according to the departmental dose-optimization program which includes automated exposure control, adjustment of the mA and/or kV according to patient size and/or use of iterative reconstruction technique. CONTRAST:  75mL OMNIPAQUE IOHEXOL 350 MG/ML SOLN COMPARISON:  January 10, 2023 FINDINGS: CT CHEST FINDINGS Cardiovascular: No significant vascular findings. Normal heart size. No pericardial effusion. Right PICC line terminates at the cavoatrial junction. Mediastinum/Nodes: No enlarged mediastinal, hilar, or axillary lymph nodes. Thyroid gland, trachea, and esophagus demonstrate no significant findings. Lungs/Pleura: Persistent widespread bilateral multifocal airspace consolidation with central and lower lobe predominance. When compared to the prior exam, there is mild improvement in the aeration of the lower lobes. No pleural effusion. Musculoskeletal: No chest wall mass or suspicious bone lesions identified. CT ABDOMEN PELVIS FINDINGS Hepatobiliary: No focal liver abnormality is seen. No gallstones, gallbladder wall  thickening, or biliary dilatation. Pancreas: Unremarkable. No pancreatic ductal dilatation or surrounding inflammatory changes. Spleen: Normal in size without focal abnormality. Adrenals/Urinary Tract: Adrenal glands are unremarkable. Kidneys are without focal lesion,  or hydronephrosis. Bilateral few mm nonobstructive nephrolithiasis. The largest calculus in the right kidney measures 3 mm. Bladder is unremarkable. Stomach/Bowel: Stomach is within normal limits. No evidence of bowel wall thickening, distention, or inflammatory changes. Vascular/Lymphatic: No significant vascular findings are present. No enlarged abdominal or pelvic lymph nodes. Reproductive: Prostate is unremarkable. Other: No abdominal wall hernia or abnormality. No abdominopelvic ascites. Musculoskeletal: No acute or significant osseous findings. IMPRESSION: Persistent bilateral multifocal pneumonia with mild improvement in the aeration of the lower lobes. Bilateral nonobstructive nephrolithiasis. Electronically Signed   By: Ted Mcalpine M.D.   On: 01/16/2023 12:49     DG Chest 2 View    Result Date: 01/16/2023  CLINICAL DATA:  Pneumonia EXAM: CHEST - 2 VIEW COMPARISON:  01/15/2023 FINDINGS: Increased asymmetric mixed interstitial and airspace opacities, worse in the right upper lobe but present bilaterally compatible with worsening bilateral pneumonia pattern. Stable heart size and vascularity. No effusion or pneumothorax. Trachea midline. Right PICC line tip SVC RA junction. IMPRESSION: Worsening bilateral multifocal pneumonia pattern, compared with yesterday. Electronically Signed   By: Judie Petit.  Shick M.D.   On: 01/16/2023 09:55     DG Abd Portable 1V    Result Date: 01/16/2023  CLINICAL DATA:  Nausea.  Mid abdominal pain. EXAM: PORTABLE ABDOMEN - 1 VIEW COMPARISON:  01/15/2023 FINDINGS: Similar appearance of mild gaseous distension of the small and large bowel loops without signs to suggest high-grade bowel obstruction. No signs of pneumoperitoneum. Osseous structures are unremarkable. IMPRESSION: Similar appearance of mild gaseous distension of the small and large bowel loops without signs to suggest high-grade bowel obstruction. Findings may reflect ileus. Electronically Signed   By: Signa Kell M.D.    On: 01/16/2023 06:41     DG Abd Portable 1V    Result Date: 01/15/2023  CLINICAL DATA:  Abdominal pain EXAM: PORTABLE ABDOMEN - 1 VIEW COMPARISON:  None Available. FINDINGS: Mild distention of both small and large bowel loops. No supine evidence of free air. No acute osseous abnormality. IMPRESSION: Mild distention of both small and large bowel loops, which may represent ileus. Electronically Signed   By: Allegra Lai M.D.   On: 01/15/2023 18:22     DG Chest Port 1 View    Result Date: 01/15/2023  CLINICAL DATA:  Cough. EXAM: PORTABLE CHEST 1 VIEW COMPARISON:  Yesterday FINDINGS: Mild improvement in bilateral airspace disease which is more extensive on the right. Right PICC with tip at the upper cavoatrial junction. Stable heart size and mediastinal contours. No effusion or pneumothorax. IMPRESSION: Multifocal pneumonia with mildly improved aeration. Electronically Signed   By: Tiburcio Pea M.D.   On: 01/15/2023 06:19     ECHOCARDIOGRAM COMPLETE    Result Date: 01/14/2023     ECHOCARDIOGRAM REPORT   Patient Name:   NAMON VILLARIN Date of Exam: 01/14/2023 Medical Rec #:  [id number]    Height:       69.0 in Accession #:    [id number]   Weight:       182.5 lb Date of Birth:  25-Feb-1981    BSA:          1.987 m Patient Age:    41 years     BP:           123/97 mmHg Patient Gender: M  HR:           92 bpm. Exam Location:  Inpatient Procedure: 2D Echo, Color Doppler, Cardiac Doppler and Intracardiac            Opacification Agent Indications:    NSTEMI I21.4  History:        Patient has no prior history of Echocardiogram examinations.                 NSTEMI, Substance Abuse; Risk Factors:Current Smoker.  Sonographer:    Aron Baba Referring Phys: [id number] Lorin Glass  Sonographer Comments: Suboptimal parasternal window. Image acquisition challenging due to respiratory motion. IMPRESSIONS  1. Left ventricular ejection fraction, by estimation, is 60 to 65%. The left ventricle has normal function. The left  ventricle has no regional wall motion abnormalities. Left ventricular diastolic parameters were normal.  2. Right ventricular systolic function is normal. The right ventricular size is normal.  3. The mitral valve is normal in structure. No evidence of mitral valve regurgitation. No evidence of mitral stenosis.  4. The aortic valve is normal in structure. Aortic valve regurgitation is not visualized. No aortic stenosis is present.  5. The inferior vena cava is normal in size with greater than 50% respiratory variability, suggesting right atrial pressure of 3 mmHg. FINDINGS  Left Ventricle: Left ventricular ejection fraction, by estimation, is 60 to 65%. The left ventricle has normal function. The left ventricle has no regional wall motion abnormalities. Definity contrast agent was given IV to delineate the left ventricular  endocardial borders. The left ventricular internal cavity size was normal in size. There is no left ventricular hypertrophy. Left ventricular diastolic parameters were normal. Right Ventricle: The right ventricular size is normal. No increase in right ventricular wall thickness. Right ventricular systolic function is normal. Left Atrium: Left atrial size was normal in size. Right Atrium: Right atrial size was normal in size. Pericardium: There is no evidence of pericardial effusion. Mitral Valve: The mitral valve is normal in structure. No evidence of mitral valve regurgitation. No evidence of mitral valve stenosis. Tricuspid Valve: The tricuspid valve is normal in structure. Tricuspid valve regurgitation is not demonstrated. No evidence of tricuspid stenosis. Aortic Valve: The aortic valve is normal in structure. Aortic valve regurgitation is not visualized. No aortic stenosis is present. Pulmonic Valve: The pulmonic valve was normal in structure. Pulmonic valve regurgitation is not visualized. No evidence of pulmonic stenosis. Aorta: The aortic root is normal in size and structure. Venous: The  inferior vena cava is normal in size with greater than 50% respiratory variability, suggesting right atrial pressure of 3 mmHg. IAS/Shunts: No atrial level shunt detected by color flow Doppler.  LEFT VENTRICLE PLAX 2D LVIDd:         5.20 cm   Diastology LVIDs:         3.40 cm   LV e' medial:    10.10 cm/s LV PW:         1.00 cm   LV E/e' medial:  8.9 LV IVS:        1.00 cm   LV e' lateral:   15.00 cm/s LVOT diam:     1.90 cm   LV E/e' lateral: 6.0 LV SV:         62 LV SV Index:   31 LVOT Area:     2.84 cm  RIGHT VENTRICLE RV S prime:     13.10 cm/s TAPSE (M-mode): 1.6 cm LEFT ATRIUM  Index        RIGHT ATRIUM           Index LA diam:      3.20 cm 1.61 cm/m   RA Area:     14.90 cm LA Vol (A4C): 27.9 ml 14.04 ml/m  RA Volume:   41.70 ml  20.98 ml/m  AORTIC VALVE LVOT Vmax:   136.00 cm/s LVOT Vmean:  89.600 cm/s LVOT VTI:    0.218 m  AORTA Ao Root diam: 3.20 cm Ao Asc diam:  3.80 cm MITRAL VALVE MV Area (PHT): 3.93 cm    SHUNTS MV Decel Time: 193 msec    Systemic VTI:  0.22 m MV E velocity: 90.10 cm/s  Systemic Diam: 1.90 cm MV A velocity: 59.70 cm/s MV E/A ratio:  1.51 Charlton Haws MD Electronically signed by Charlton Haws MD Signature Date/Time: 01/14/2023/2:53:08 PM    Final      DG CHEST PORT 1 VIEW    Result Date: 01/14/2023  CLINICAL DATA:  Respiratory failure EXAM: PORTABLE CHEST 1 VIEW COMPARISON:  CXR 01/12/23 FINDINGS: Right arm PICC with the tip at the cavoatrial junction. There are persistent bilateral patchy airspace opacities, not significantly changed from prior exam, that remain worrisome for infection. No pleural effusion. No pneumothorax. No radiographically apparent displaced rib fractures. Visualized upper abdomen is unremarkable. IMPRESSION: No significant change in bilateral patchy airspace opacities, which remain worrisome for infection. Improved bilateral pleural effusions. Electronically Signed   By: Lorenza Cambridge M.D.   On: 01/14/2023 07:53       Signature  -   Susa Raring M.D on  01/17/2023 at 7:41 AM   -  To page go to [email or web address]    Electronically signed by Leroy Sea, MD at 01/17/2023  7:48 AM EDT

## 2023-01-17 NOTE — Progress Notes (Signed)
Formatting of this note is different from the original.  Occupational Therapy Treatment  Patient Details  Name: Lucas Hicks  MRN: [id number]  DOB: 07/08/1981  Today's Date: 01/17/2023    History of present illness 42 yo male admitted 4/5 after found unresponsive in hotel. Combative after narcan. Intubated for airway protection 4/5-4/6. Aspiration PNA with respiratory failure. PMHx: Polysubstance abuse, Hep C, IVDA, depression, ETOH, substance induced mood disorder    OT comments   Pt making good progress with functional goals. Pt reports that he feels much better and that he is going home tomorrow. Pt safely transferred on and off toilet and in and out of shower without physical assist, no difficulty or LOB. OT will continue to follow acutely to maximize level of function and safety     Recommendations for follow up therapy are one component of a multi-disciplinary discharge planning process, led by the attending physician.  Recommendations may be updated based on patient status, additional functional criteria and insurance authorization.    Assistance Recommended at Discharge Set up Supervision/Assistance   Patient can return home with the following   A little help with bathing/dressing/bathroom;Assistance with cooking/housework;Direct supervision/assist for medications management;Assist for transportation;Help with stairs or ramp for entrance    Equipment Recommendations      Recommendations for Other Services        Precautions / Restrictions Precautions  Precautions: Fall  Restrictions  Weight Bearing Restrictions: No         Mobility Bed Mobility                General bed mobility comments: pt sitting in recliner upon arrival      Transfers  Overall transfer level: Needs assistance  Equipment used: None, 1 person hand held assist  Transfers: Sit to/from Stand  Sit to Stand: Supervision, Modified independent (Device/Increase time)            General transfer comment: Sup for safety, pt transferred on and off  toilet and in and out of shower without physical assist, no difficulty or LOB      Balance Overall balance assessment: Mild deficits observed, not formally tested  Sitting-balance support: Feet supported  Sitting balance-Leahy Scale: Good      Standing balance support: Bilateral upper extremity supported, During functional activity  Standing balance-Leahy Scale: Fair                              ADL either performed or assessed with clinical judgement     ADL Overall ADL's : Needs assistance/impaired      Grooming: Wash/dry hands;Wash/dry face;Modified independent                Lower Body Dressing: Supervision/safety;Modified independent    Toilet Transfer: Supervision/safety;Ambulation    Toileting- Clothing Manipulation and Hygiene: Modified independent    Tub/ Shower Transfer: Supervision/safety;Ambulation    Functional mobility during ADLs: Supervision/safety  General ADL Comments: Pt with improved activity tolerance and strength      Extremity/Trunk Assessment Upper Extremity Assessment  Upper Extremity Assessment: Overall WFL for tasks assessed    Lower Extremity Assessment  Lower Extremity Assessment: Defer to PT evaluation          Vision Patient Visual Report: No change from baseline      Perception      Praxis        Cognition Arousal/Alertness: Awake/alert  Behavior During Therapy: WFL for tasks assessed/performed  Overall  Cognitive Status: Within Functional Limits for tasks assessed                                          Exercises        Shoulder Instructions        General Comments       Pertinent Vitals/ Pain       Pain Assessment  Pain Assessment: 0-10  Pain Score: 2   Pain Location: L shoulder with movement  Pain Descriptors / Indicators: Discomfort, Sore  Pain Intervention(s): Monitored during session, Repositioned    Home Living                                            Prior Functioning/Environment               Frequency   Min 2X/week       Progress Toward Goals    OT Goals(current goals can  now be found in the care plan section)   Progress towards OT goals: Progressing toward goals      Plan Discharge plan remains appropriate      Co-evaluation                  AM-PAC OT "6 Clicks" Daily Activity      Outcome Measure     Help from another person eating meals?: None  Help from another person taking care of personal grooming?: None  Help from another person toileting, which includes using toliet, bedpan, or urinal?: None  Help from another person bathing (including washing, rinsing, drying)?: A Little  Help from another person to put on and taking off regular upper body clothing?: None  Help from another person to put on and taking off regular lower body clothing?: A Little  6 Click Score: 22      End of Session      OT Visit Diagnosis: Unsteadiness on feet (R26.81);Muscle weakness (generalized) (M62.81)    Activity Tolerance Patient tolerated treatment well    Patient Left in chair;with call bell/phone within reach    Nurse Communication            Time: 1610-9604  OT Time Calculation (min): 12 min    Charges: OT General Charges  $OT Visit: 1 Visit  OT Treatments  $Therapeutic Activity: 8-22 mins    Galen Manila  01/17/2023, 1:31 PM  Electronically signed by Lafe Garin, OT at 01/17/2023  1:32 PM EDT

## 2023-01-17 NOTE — Progress Notes (Signed)
Formatting of this note is different from the original.  Physical Therapy Treatment  Patient Details  Name: Lucas Hicks  MRN: [id number]  DOB: [birth year]  Today's Date: 01/17/2023    History of Present Illness 42 yo male admitted 4/5 after found unresponsive in hotel. Combative after narcan. Intubated for airway protection 4/5-4/6. Aspiration PNA with respiratory failure. PMHx: Polysubstance abuse, Hep C, IVDA, depression, ETOH, substance induced mood disorder      PT Comments      Pt admitted with above diagnosis. Scored 18/24 on DGI suggesting slight risk of falls however pt didn't have LOB that he couldn't self correct with challenges today.   Pt gets winded easily and was fatigued at end of walk.  Pt demonstrates safety in controlled environment with mobility. Will continue to progress balance and endurance. Pt currently with functional limitations due to balance and endurance deficits. Pt will benefit from acute skilled PT to increase their independence and safety with mobility to allow discharge.       Recommendations for follow up therapy are one component of a multi-disciplinary discharge planning process, led by the attending physician.  Recommendations may be updated based on patient status, additional functional criteria and insurance authorization.    Follow Up Recommendations        Assistance Recommended at Discharge PRN   Patient can return home with the following Assist for transportation    Equipment Recommendations   None recommended by PT     Recommendations for Other Services        Precautions / Restrictions Precautions  Precautions: Fall  Restrictions  Weight Bearing Restrictions: No       Mobility   Bed Mobility                General bed mobility comments: On arrival, pt sitting in chair.  Once pt saw PT, pt got up and began to put pants and shoes on without assist.  Pt able to don pants while standing.  Able to don tennis shoes wtihout assist.      Transfers        Sit to Stand:  Independent                  Ambulation/Gait        Gait Pattern/deviations: Step-through pattern, Decreased stride length, Drifts right/left        General Gait Details: Pt ambulated for 700 ft with min guard assist. Pt was unstable with and without challenges but was able to self correct.    Stairs        Number of Stairs: 10 (x2)  General stair comments: Pt stated he was very winded after going up a flight of stairs. Pt needed to rest before descending stairs both attempts.  O2 98% after descending stairs.    Wheelchair Mobility      Modified Rankin (Stroke Patients Only)        Balance                                      Dynamic Gait Index  Gait and Pivot Turn: Normal  Step Around Obstacles: Normal  Total Score: 18        Cognition  Exercises        General Comments General comments (skin integrity, edema, etc.): starting SpO2 100%, after stairs SpO2 97%. educated pt on respiratory concerns.  Reviewed incentive spirometer use with pt and had pt return demonstration.        Pertinent Vitals/Pain       Home Living                          Prior Function             PT Goals (current goals can now be found in the care plan section) Progress towards PT goals: Progressing toward goals      Frequency     Min 3X/week      PT Plan Current plan remains appropriate     Co-evaluation                AM-PAC PT "6 Clicks" Mobility    Outcome Measure   Help needed turning from your back to your side while in a flat bed without using bedrails?: None  Help needed moving from lying on your back to sitting on the side of a flat bed without using bedrails?: None  Help needed moving to and from a bed to a chair (including a wheelchair)?: None  Help needed standing up from a chair using your arms (e.g., wheelchair or bedside chair)?: None  Help needed to walk in hospital room?: None  Help needed climbing 3-5 steps with a railing? : A Little  6 Click Score: 23      End of Session  Equipment Utilized During Treatment: Gait belt  Activity Tolerance: Patient tolerated treatment well;Patient limited by fatigue  Patient left: in bed (sitting on edge of bed)  Nurse Communication: Mobility status;Other (comment) (Pt requested breathing treatment)  PT Visit Diagnosis: Other abnormalities of gait and mobility (R26.89)      Time: 1610-9604  PT Time Calculation (min) (ACUTE ONLY): 27 min    Charges:  $Gait Training: 23-37 mins               Dawn M,PT  Acute Rehab Services  [phone]     Bevelyn Buckles  01/17/2023, 3:33 PM      Electronically signed by Bevelyn Buckles, PT at 01/17/2023  3:50 PM EDT

## 2023-01-18 DIAGNOSIS — G929 Unspecified toxic encephalopathy: Secondary | ICD-10-CM | POA: Diagnosis not present

## 2023-01-18 LAB — CBC WITH DIFFERENTIAL/PLATELET
Abs Immature Granulocytes: 0 10*3/uL (ref 0.00–0.07)
Basophils Absolute: 0.3 10*3/uL — ABNORMAL HIGH (ref 0.0–0.1)
Basophils Relative: 2 %
Eosinophils Absolute: 0.3 10*3/uL (ref 0.0–0.5)
Eosinophils Relative: 2 %
HCT: 38.7 % — ABNORMAL LOW (ref 39.0–52.0)
Hemoglobin: 12.5 g/dL — ABNORMAL LOW (ref 13.0–17.0)
Lymphocytes Relative: 20 %
Lymphs Abs: 3 10*3/uL (ref 0.7–4.0)
MCH: 29.4 pg (ref 26.0–34.0)
MCHC: 32.3 g/dL (ref 30.0–36.0)
MCV: 91.1 fL (ref 80.0–100.0)
Monocytes Absolute: 0.4 10*3/uL (ref 0.1–1.0)
Monocytes Relative: 3 %
Neutro Abs: 10.8 10*3/uL — ABNORMAL HIGH (ref 1.7–7.7)
Neutrophils Relative %: 73 %
Platelets: 347 10*3/uL (ref 150–400)
RBC: 4.25 MIL/uL (ref 4.22–5.81)
RDW: 15.5 % (ref 11.5–15.5)
WBC: 14.8 10*3/uL — ABNORMAL HIGH (ref 4.0–10.5)
nRBC: 0 % (ref 0.0–0.2)
nRBC: 0 /100 WBC

## 2023-01-18 LAB — COMPREHENSIVE METABOLIC PANEL
ALT: 121 U/L — ABNORMAL HIGH (ref 0–44)
AST: 40 U/L (ref 15–41)
Albumin: 2.4 g/dL — ABNORMAL LOW (ref 3.5–5.0)
Alkaline Phosphatase: 82 U/L (ref 38–126)
Anion gap: 7 (ref 5–15)
BUN: 29 mg/dL — ABNORMAL HIGH (ref 6–20)
CO2: 25 mmol/L (ref 22–32)
Calcium: 8.5 mg/dL — ABNORMAL LOW (ref 8.9–10.3)
Chloride: 103 mmol/L (ref 98–111)
Creatinine, Ser: 1.2 mg/dL (ref 0.61–1.24)
GFR, Estimated: >60 mL/min (ref >60)
Glucose, Bld: 109 mg/dL — ABNORMAL HIGH (ref 70–99)
Potassium: 3.9 mmol/L (ref 3.5–5.1)
Sodium: 135 mmol/L (ref 135–145)
Total Bilirubin: 0.8 mg/dL (ref 0.3–1.2)
Total Protein: 6.1 g/dL — ABNORMAL LOW (ref 6.5–8.1)

## 2023-01-18 LAB — C-REACTIVE PROTEIN: CRP: 4.2 mg/dL — ABNORMAL HIGH (ref <1.0)

## 2023-01-18 LAB — PROCALCITONIN: Procalcitonin: 0.19 ng/mL

## 2023-01-18 LAB — BRAIN NATRIURETIC PEPTIDE: B Natriuretic Peptide: 6.9 pg/mL (ref 0.0–100.0)

## 2023-01-18 MED ORDER — ALBUTEROL SULFATE HFA 108 (90 BASE) MCG/ACT IN AERS: 2.0000 | INHALATION_SPRAY | Freq: Four times a day (QID) | RESPIRATORY_TRACT | 0 refills | Status: DC | PRN

## 2023-01-18 MED ORDER — ACETAMINOPHEN 500 MG PO TABS: 500.0000 mg | ORAL_TABLET | Freq: Three times a day (TID) | ORAL | 0 refills | Status: DC | PRN

## 2023-01-18 MED ORDER — LEVOFLOXACIN 750 MG PO TABS: 750.0000 mg | ORAL_TABLET | Freq: Every day | ORAL | 0 refills | Status: DC

## 2023-01-18 NOTE — Discharge Summary (Signed)
Randall Parsons ZDG:[id number] DOB: 1980/11/06 DOA: 01/10/2023  PCP: [facility]  Admit date: 01/10/2023  Discharge date: 01/18/2023  Admitted From: Home   Disposition:  Home   Recommendations for Outpatient Follow-up:   Follow up with PCP in 1-2 weeks  PCP Please obtain BMP/CBC, 2 view CXR in 1week,  (see Discharge instructions)   PCP Please follow up on the following pending results: Repeat CBC, CMP, magnesium, 2 view chest x-ray in 7 to 10 days, needs outpatient ID follow-up for hep C positive status.   Home Health: None   Equipment/Devices: None  Consultations: PCCM Discharge Condition: Stable    CODE STATUS: Full    Diet Recommendation: Heart Healthy   Diet Order             Diet - low sodium heart healthy           Diet Heart Fluid consistency: Thin  Diet effective now                    CC - SOB   Brief history of present illness from the day of admission and additional interim summary    42 year old male patient with history Polysubstance abuse, Hep C, IVDA, depression, ETOH, substance induced mood disorder, he was found unresponsive at the hotel and was brought to the ER unresponsive apneic with pinpoint pupils presumed to have drug overdose, he was found to have pneumonia along with acute hypoxic respiratory failure he was intubated admitted to ICU, stabilized transferred to hospitalist service on 01/15/2023 on day 5 of his hospital stay.                                                                  Hospital Course   Acute hypoxic respiratory failure secondary to aspiration pneumonia, pulmonary edema, NSTEMI, in the setting of acute toxic encephalopathy caused by recreational drug overdose, history of IVDA in the past. He was admitted to the ICU intubated for 24 hours, started on  antibiotics, overall improving, currently on nasal cannula oxygen still completing his antibiotic treatment, tapered off oral steroids, counseled to abstain from all drug use.  Advance activity, encouraged to sit up in chair use I-S and flutter valve for pulmonary toiletry, continue to monitor.  CTA noted on 01/17/2023 with continued bilateral infiltrates.  Now symptom-free on room air, will be discharged on rescue inhaler 3 more days of oral Levaquin with outpatient PCP follow-up.   History of positive hep C.  Outpatient follow-up with GI and ID to be arranged by PCP, patient reminded about it.   Elevated troponin, likely demand ischemia caused by hypoxia from #1 above along with cocaine use.  EKG and echo stable with preserved EF and no wall motion abnormality, chest pain-free, continue to monitor.  Consult  to quit doing cocaine and benzodiazepine abuse.   Transaminitis with mild downtrending elevation of lipase.  Likely due to shock liver caused by hypoxic respiratory failure hypotension and anoxia, trend is improving.  Symptom-free now.  Requested to follow-up with PCP for CMP check and to get referral for hep C follow-up with ID or GI physician.   Ileus induced abdominal pain on 01/15/2023.  Resolved after supportive care.  Feeling back to baseline.    Discharge diagnosis     Principal Problem:   Toxic encephalopathy    Discharge instructions    Discharge Instructions     Diet - low sodium heart healthy   Complete by: As directed    Discharge instructions   Complete by: As directed    Follow with Primary MD [facility] in 7 days, get ID referral for hep C status.  Get CBC, CMP, 2 view Chest X ray -  checked next visit with your primary MD   Activity: As tolerated with Full fall precautions use walker/cane & assistance as needed  Disposition Home    Diet: Heart Healthy    Special Instructions: If you have smoked or chewed Tobacco  in the last 2 yrs please stop  smoking, stop any regular Alcohol  and or any Recreational drug use.  On your next visit with your primary care physician please Get Medicines reviewed and adjusted.  Please request your Prim.MD to go over all Hospital Tests and Procedure/Radiological results at the follow up, please get all Hospital records sent to your Prim MD by signing hospital release before you go home.  If you experience worsening of your admission symptoms, develop shortness of breath, life threatening emergency, suicidal or homicidal thoughts you must seek medical attention immediately by calling 911 or calling your MD immediately  if symptoms less severe.  You Must read complete instructions/literature along with all the possible adverse reactions/side effects for all the Medicines you take and that have been prescribed to you. Take any new Medicines after you have completely understood and accpet all the possible adverse reactions/side effects.   Do not drive when taking Pain medications.  Do not take more than prescribed Pain, Sleep and Anxiety Medications   Increase activity slowly   Complete by: As directed        Discharge Medications   Allergies as of 01/18/2023       Reactions   Amoxicillin Hives, Other (See Comments)   Did it involve swelling of the face/tongue/throat, SOB, or low BP? No Did it involve sudden or severe rash/hives, skin peeling, or any reaction on the inside of your mouth or nose? Yes Did you need to seek medical attention at a hospital or doctor's office? Yes When did it last happen?     42 yrs old If all above answers are "NO", may proceed with cephalosporin use.   Fish Allergy Hives   Prednisone Other (See Comments)   irritable        Medication List     STOP taking these medications    amLODipine 5 MG tablet Commonly known as: NORVASC   ibuprofen 800 MG tablet Commonly known as: ADVIL       TAKE these medications    acetaminophen 500 MG tablet Commonly known as:  TYLENOL Take 1 tablet (500 mg total) by mouth every 8 (eight) hours as needed for moderate pain. What changed:  when to take this reasons to take this   albuterol 108 (90 Base) MCG/ACT  inhaler Commonly known as: VENTOLIN HFA Inhale 2 puffs into the lungs every 6 (six) hours as needed for wheezing or shortness of breath (shortness of breath or wheezing).   alfuzosin 10 MG 24 hr tablet Commonly known as: UROXATRAL Take 1 tablet by mouth daily.   lamoTRIgine 200 MG tablet Commonly known as: LAMICTAL Take 200 mg by mouth daily.   levofloxacin 750 MG tablet Commonly known as: LEVAQUIN Take 1 tablet (750 mg total) by mouth daily.         Follow-up Information     [facility] Schedule an appointment as soon as possible for a visit in 1 week(s).   Contact information: [address] [place] [address] [phone]                 Major procedures and Radiology Reports - PLEASE review detailed and final reports thoroughly  -      CT CHEST ABDOMEN PELVIS W CONTRAST  Result Date: 01/16/2023 CLINICAL DATA:  Sepsis. EXAM: CT CHEST, ABDOMEN, AND PELVIS WITH CONTRAST TECHNIQUE: Multidetector CT imaging of the chest, abdomen and pelvis was performed following the standard protocol during bolus administration of intravenous contrast. RADIATION DOSE REDUCTION: This exam was performed according to the departmental dose-optimization program which includes automated exposure control, adjustment of the mA and/or kV according to patient size and/or use of iterative reconstruction technique. CONTRAST:  75mL OMNIPAQUE IOHEXOL 350 MG/ML SOLN COMPARISON:  January 10, 2023 FINDINGS: CT CHEST FINDINGS Cardiovascular: No significant vascular findings. Normal heart size. No pericardial effusion. Right PICC line terminates at the cavoatrial junction. Mediastinum/Nodes: No enlarged mediastinal, hilar, or axillary lymph nodes. Thyroid gland, trachea, and esophagus  demonstrate no significant findings. Lungs/Pleura: Persistent widespread bilateral multifocal airspace consolidation with central and lower lobe predominance. When compared to the prior exam, there is mild improvement in the aeration of the lower lobes. No pleural effusion. Musculoskeletal: No chest wall mass or suspicious bone lesions identified. CT ABDOMEN PELVIS FINDINGS Hepatobiliary: No focal liver abnormality is seen. No gallstones, gallbladder wall thickening, or biliary dilatation. Pancreas: Unremarkable. No pancreatic ductal dilatation or surrounding inflammatory changes. Spleen: Normal in size without focal abnormality. Adrenals/Urinary Tract: Adrenal glands are unremarkable. Kidneys are without focal lesion, or hydronephrosis. Bilateral few mm nonobstructive nephrolithiasis. The largest calculus in the right kidney measures 3 mm. Bladder is unremarkable. Stomach/Bowel: Stomach is within normal limits. No evidence of bowel wall thickening, distention, or inflammatory changes. Vascular/Lymphatic: No significant vascular findings are present. No enlarged abdominal or pelvic lymph nodes. Reproductive: Prostate is unremarkable. Other: No abdominal wall hernia or abnormality. No abdominopelvic ascites. Musculoskeletal: No acute or significant osseous findings. IMPRESSION: Persistent bilateral multifocal pneumonia with mild improvement in the aeration of the lower lobes. Bilateral nonobstructive nephrolithiasis. Electronically Signed   By: Ted Mcalpine M.D.   On: 01/16/2023 12:49   DG Chest 2 View  Result Date: 01/16/2023 CLINICAL DATA:  Pneumonia EXAM: CHEST - 2 VIEW COMPARISON:  01/15/2023 FINDINGS: Increased asymmetric mixed interstitial and airspace opacities, worse in the right upper lobe but present bilaterally compatible with worsening bilateral pneumonia pattern. Stable heart size and vascularity. No effusion or pneumothorax. Trachea midline. Right PICC line tip SVC RA junction. IMPRESSION:  Worsening bilateral multifocal pneumonia pattern, compared with yesterday. Electronically Signed   By: Judie Petit.  Shick M.D.   On: 01/16/2023 09:55   DG Abd Portable 1V  Result Date: 01/16/2023 CLINICAL DATA:  Nausea.  Mid abdominal pain. EXAM: PORTABLE ABDOMEN - 1 VIEW  COMPARISON:  01/15/2023 FINDINGS: Similar appearance of mild gaseous distension of the small and large bowel loops without signs to suggest high-grade bowel obstruction. No signs of pneumoperitoneum. Osseous structures are unremarkable. IMPRESSION: Similar appearance of mild gaseous distension of the small and large bowel loops without signs to suggest high-grade bowel obstruction. Findings may reflect ileus. Electronically Signed   By: Signa Kell M.D.   On: 01/16/2023 06:41   DG Abd Portable 1V  Result Date: 01/15/2023 CLINICAL DATA:  Abdominal pain EXAM: PORTABLE ABDOMEN - 1 VIEW COMPARISON:  None Available. FINDINGS: Mild distention of both small and large bowel loops. No supine evidence of free air. No acute osseous abnormality. IMPRESSION: Mild distention of both small and large bowel loops, which may represent ileus. Electronically Signed   By: Allegra Lai M.D.   On: 01/15/2023 18:22   DG Chest Port 1 View  Result Date: 01/15/2023 CLINICAL DATA:  Cough. EXAM: PORTABLE CHEST 1 VIEW COMPARISON:  Yesterday FINDINGS: Mild improvement in bilateral airspace disease which is more extensive on the right. Right PICC with tip at the upper cavoatrial junction. Stable heart size and mediastinal contours. No effusion or pneumothorax. IMPRESSION: Multifocal pneumonia with mildly improved aeration. Electronically Signed   By: Tiburcio Pea M.D.   On: 01/15/2023 06:19   ECHOCARDIOGRAM COMPLETE  Result Date: 01/14/2023    ECHOCARDIOGRAM REPORT   Patient Name:   LORENCE DUSING Date of Exam: 01/14/2023 Medical Rec #:  [id number]    Height:       69.0 in Accession #:    [id number]   Weight:       182.5 lb Date of Birth:  1981/02/21    BSA:           1.987 m Patient Age:    41 years     BP:           123/97 mmHg Patient Gender: M            HR:           92 bpm. Exam Location:  Inpatient Procedure: 2D Echo, Color Doppler, Cardiac Doppler and Intracardiac            Opacification Agent Indications:    NSTEMI I21.4  History:        Patient has no prior history of Echocardiogram examinations.                 NSTEMI, Substance Abuse; Risk Factors:Current Smoker.  Sonographer:    Aron Baba Referring Phys: [id number] Lorin Glass  Sonographer Comments: Suboptimal parasternal window. Image acquisition challenging due to respiratory motion. IMPRESSIONS  1. Left ventricular ejection fraction, by estimation, is 60 to 65%. The left ventricle has normal function. The left ventricle has no regional wall motion abnormalities. Left ventricular diastolic parameters were normal.  2. Right ventricular systolic function is normal. The right ventricular size is normal.  3. The mitral valve is normal in structure. No evidence of mitral valve regurgitation. No evidence of mitral stenosis.  4. The aortic valve is normal in structure. Aortic valve regurgitation is not visualized. No aortic stenosis is present.  5. The inferior vena cava is normal in size with greater than 50% respiratory variability, suggesting right atrial pressure of 3 mmHg. FINDINGS  Left Ventricle: Left ventricular ejection fraction, by estimation, is 60 to 65%. The left ventricle has normal function. The left ventricle has no regional wall motion abnormalities. Definity contrast agent was given IV to delineate the left ventricular  endocardial borders. The left ventricular internal cavity size was normal in size. There is no left ventricular hypertrophy. Left ventricular diastolic parameters were normal. Right Ventricle: The right ventricular size is normal. No increase in right ventricular wall thickness. Right ventricular systolic function is normal. Left Atrium: Left atrial size was normal in size. Right  Atrium: Right atrial size was normal in size. Pericardium: There is no evidence of pericardial effusion. Mitral Valve: The mitral valve is normal in structure. No evidence of mitral valve regurgitation. No evidence of mitral valve stenosis. Tricuspid Valve: The tricuspid valve is normal in structure. Tricuspid valve regurgitation is not demonstrated. No evidence of tricuspid stenosis. Aortic Valve: The aortic valve is normal in structure. Aortic valve regurgitation is not visualized. No aortic stenosis is present. Pulmonic Valve: The pulmonic valve was normal in structure. Pulmonic valve regurgitation is not visualized. No evidence of pulmonic stenosis. Aorta: The aortic root is normal in size and structure. Venous: The inferior vena cava is normal in size with greater than 50% respiratory variability, suggesting right atrial pressure of 3 mmHg. IAS/Shunts: No atrial level shunt detected by color flow Doppler.  LEFT VENTRICLE PLAX 2D LVIDd:         5.20 cm   Diastology LVIDs:         3.40 cm   LV e' medial:    10.10 cm/s LV PW:         1.00 cm   LV E/e' medial:  8.9 LV IVS:        1.00 cm   LV e' lateral:   15.00 cm/s LVOT diam:     1.90 cm   LV E/e' lateral: 6.0 LV SV:         62 LV SV Index:   31 LVOT Area:     2.84 cm  RIGHT VENTRICLE RV S prime:     13.10 cm/s TAPSE (M-mode): 1.6 cm LEFT ATRIUM           Index        RIGHT ATRIUM           Index LA diam:      3.20 cm 1.61 cm/m   RA Area:     14.90 cm LA Vol (A4C): 27.9 ml 14.04 ml/m  RA Volume:   41.70 ml  20.98 ml/m  AORTIC VALVE LVOT Vmax:   136.00 cm/s LVOT Vmean:  89.600 cm/s LVOT VTI:    0.218 m  AORTA Ao Root diam: 3.20 cm Ao Asc diam:  3.80 cm MITRAL VALVE MV Area (PHT): 3.93 cm    SHUNTS MV Decel Time: 193 msec    Systemic VTI:  0.22 m MV E velocity: 90.10 cm/s  Systemic Diam: 1.90 cm MV A velocity: 59.70 cm/s MV E/A ratio:  1.51 Charlton Haws MD Electronically signed by Charlton Haws MD Signature Date/Time: 01/14/2023/2:53:08 PM    Final    DG CHEST  PORT 1 VIEW  Result Date: 01/14/2023 CLINICAL DATA:  Respiratory failure EXAM: PORTABLE CHEST 1 VIEW COMPARISON:  CXR 01/12/23 FINDINGS: Right arm PICC with the tip at the cavoatrial junction. There are persistent bilateral patchy airspace opacities, not significantly changed from prior exam, that remain worrisome for infection. No pleural effusion. No pneumothorax. No radiographically apparent displaced rib fractures. Visualized upper abdomen is unremarkable. IMPRESSION: No significant change in bilateral patchy airspace opacities, which remain worrisome for infection. Improved bilateral pleural effusions. Electronically Signed   By: Randall Parsons M.D.   On: 01/14/2023 07:53  DG Chest Port 1 View  Result Date: 01/12/2023 CLINICAL DATA:  Pneumonia. EXAM: PORTABLE CHEST 1 VIEW COMPARISON:  Radiograph yesterday. CT 01/10/2023 FINDINGS: Significant patient rotation on the current exam, lung volumes are low. Tip of the right upper extremity PICC at the atrial caval junction. Heterogeneous bilateral lung opacities with equivocal worsening from yesterday. Small bilateral pleural effusions. No pneumothorax. Stable heart size and mediastinal contours allowing for rotation and low lung volumes. IMPRESSION: 1. Equivocal worsening of bilateral lung opacities since yesterday. 2. Small bilateral pleural effusions. 3. Stable right upper extremity PICC. Electronically Signed   By: Narda Rutherford M.D.   On: 01/12/2023 11:33   DG CHEST PORT 1 VIEW  Result Date: 01/11/2023 CLINICAL DATA:  Hypoxia EXAM: PORTABLE CHEST 1 VIEW COMPARISON:  01/11/2023 FINDINGS: Interval placement of a right sided PICC is seen at the cavoatrial junction. Cardiac shadow is enlarged accentuated by the portable technique. Increasing central vascular congestion is noted. Multifocal airspace opacities are noted slightly increased from the prior study. This is likely related to a poor inspiratory effort. Some superimposed edema could not be totally  excluded. IMPRESSION: Patchy airspace opacities bilaterally somewhat increased from the prior exam likely related to a poor inspiratory effort. New right-sided PICC is seen in satisfactory position. Electronically Signed   By: Alcide Clever M.D.   On: 01/11/2023 21:03   DG Chest Port 1 View  Result Date: 01/11/2023 CLINICAL DATA:  42 year old male with possible aspiration pneumonia. EXAM: PORTABLE CHEST 1 VIEW COMPARISON:  Chest x-ray 01/10/2023. FINDINGS: An endotracheal tube is in place with tip 5.2 cm above the carina. A nasogastric tube is seen extending into the stomach, however, the tip of the nasogastric tube extends below the lower margin of the image. Patchy ill-defined airspace consolidation again noted throughout the mid to lower lungs bilaterally (right greater than left), with slightly improved aeration compared with yesterday's examination. No pleural effusions. No pneumothorax. No evidence of pulmonary edema. Heart size is normal. Upper mediastinal contours are within normal limits. IMPRESSION: 1. Support apparatus, as above. 2. Multilobar bilateral pneumonia redemonstrated with slight improved aeration compared to yesterday's examination. Electronically Signed   By: Trudie Reed M.D.   On: 01/11/2023 05:49   [place] EKG SITE RITE  Result Date: 01/10/2023 If Site Rite image not attached, placement could not be confirmed due to current cardiac rhythm.  CT CHEST ABDOMEN PELVIS WO CONTRAST  Result Date: 01/10/2023 CLINICAL DATA:  Sepsis, OD/sepsis EXAM: CT CHEST, ABDOMEN AND PELVIS WITHOUT CONTRAST TECHNIQUE: Multidetector CT imaging of the chest, abdomen and pelvis was performed following the standard protocol without IV contrast. RADIATION DOSE REDUCTION: This exam was performed according to the departmental dose-optimization program which includes automated exposure control, adjustment of the mA and/or kV according to patient size and/or use of iterative reconstruction technique. COMPARISON:   Chest radiograph performed earlier on the same date. FINDINGS: CT CHEST FINDINGS Cardiovascular: No significant vascular findings. Normal heart size. No pericardial effusion. Mediastinum/Nodes: No enlarged mediastinal, hilar, or axillary lymph nodes. Thyroid gland, trachea, and esophagus demonstrate no significant findings. Endotracheal tube and feeding tube coursing below the diaphragm in the body of the stomach. Lungs/Pleura: Bilateral multifocal lung opacities prominent in the dependent portion of the bilateral lower lobes with air bronchograms consistent with multifocal pneumonia. No pleural effusion or pneumothorax. Musculoskeletal: No chest wall mass or suspicious bone lesions identified. CT ABDOMEN PELVIS FINDINGS Hepatobiliary: No focal liver abnormality is seen. No gallstones, gallbladder wall thickening, or biliary dilatation. Pancreas: Unremarkable. No  pancreatic ductal dilatation or surrounding inflammatory changes. Spleen: Normal in size without focal abnormality. Adrenals/Urinary Tract: Adrenal glands are unremarkable. Punctate nonobstructing calculus in the upper pole of the left kidney. There is also 4 mm nonobstructing calculus in the interpolar region of the right kidney. No hydronephrosis or ureteral calculus. Foley's catheter in place, urinary bladder is otherwise unremarkable. Stomach/Bowel: Stomach is within normal limits. NG tube with distal tip terminating the body of the stomach. Small bowel loops are normal in caliber. Appendix not identified, however no inflammatory changes in the pericecal region. No evidence of bowel wall thickening, distention, or inflammatory changes. Vascular/Lymphatic: No significant vascular findings are present. No enlarged abdominal or pelvic lymph nodes. Reproductive: Prostate is unremarkable. Other: No abdominal wall hernia or abnormality. No abdominopelvic ascites. Musculoskeletal: No acute or significant osseous findings. IMPRESSION: 1. Bilateral multifocal  lung opacities prominent in the dependent portion of the bilateral lower lobes with air bronchograms consistent with multifocal pneumonia. Follow-up examination to resolution is recommended. 2. Endotracheal tube, feeding tube and Foley's catheter are in appropriate position. 3. No evidence of bowel obstruction. No evidence of colitis or diverticulitis. 4. Bilateral nonobstructing renal calculi. No hydronephrosis or ureteral calculus. Electronically Signed   By: Larose Hires D.O.   On: 01/10/2023 15:16   CT Head Wo Contrast  Result Date: 01/10/2023 CLINICAL DATA:  Provided history: Mental status change, unknown cause. EXAM: CT HEAD WITHOUT CONTRAST TECHNIQUE: Contiguous axial images were obtained from the base of the skull through the vertex without intravenous contrast. RADIATION DOSE REDUCTION: This exam was performed according to the departmental dose-optimization program which includes automated exposure control, adjustment of the mA and/or kV according to patient size and/or use of iterative reconstruction technique. COMPARISON:  No pertinent prior exams available for comparison. FINDINGS: Brain: No age advanced or lobar predominant parenchymal atrophy. 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 There is no acute intracranial hemorrhage. No demarcated cortical infarct. No extra-axial fluid collection. No evidence of an intracranial mass. No midline shift. Vascular: No hyperdense vessel. Atherosclerotic calcifications. Skull: No fracture or aggressive osseous lesion. Sinuses/Orbits: No mass or acute finding within the imaged orbits. No significant paranasal sinus disease. Other: Partially imaged life-support tubes. Secretions within the nasopharynx. IMPRESSION: 1.  No evidence of an acute intracranial abnormality. 2. Chronic right basal ganglia lacunar infarct. Electronically Signed   By: Jackey Loge D.O.   On: 01/10/2023 14:59   DG Chest Port 1 View  Result Date: 01/10/2023 CLINICAL DATA:   Intubation EXAM: PORTABLE CHEST 1 VIEW COMPARISON:  Chest radiograph 07/02/2021 FINDINGS: Endotracheal tube terminates approximately 4 cm above the carina. Enteric tube courses below diaphragm with the tip projecting over the location of the stomach and the side hole at the level of the GE junction. No pleural effusion. No pneumothorax. Normal cardiac and mediastinal contours. There are prominent bilateral perihilar airspace opacities, which could represent pulmonary edema or atypical infection. No radiographically apparent displaced rib fractures. IMPRESSION: 1. Prominent bilateral perihilar airspace opacities, which could represent pulmonary edema or atypical infection. If the patient is immunocompromised, fungal infection is also a differential consideration. 2. Endotracheal tube terminates approximately 4 cm above the carina. 3. Enteric tube side hole at the level of the GE junction, recommend advancement by 5 cm. Electronically Signed   By: Randall Parsons M.D.   On: 01/10/2023 12:04    Micro Results    Recent Results (from the past 240 hour(s))  Culture, blood (routine x 2)     Status:  None   Collection Time: 01/10/23 11:39 AM   Specimen: BLOOD RIGHT WRIST  Result Value Ref Range Status   Specimen Description BLOOD RIGHT WRIST  Final   Special Requests   Final    BOTTLES DRAWN AEROBIC AND ANAEROBIC Blood Culture adequate volume   Culture   Final    NO GROWTH 5 DAYS Performed at [facility], [address] [place], [place] [address]    Report Status 01/15/2023 FINAL  Final  Culture, blood (routine x 2)     Status: None   Collection Time: 01/10/23  5:09 PM   Specimen: BLOOD LEFT HAND  Result Value Ref Range Status   Specimen Description BLOOD LEFT HAND  Final   Special Requests   Final    BOTTLES DRAWN AEROBIC AND ANAEROBIC Blood Culture adequate volume   Culture   Final    NO GROWTH 5 DAYS Performed at [facility], [address] [place], [place] [address]    Report  Status 01/15/2023 FINAL  Final  Culture, blood (Routine X 2) w Reflex to ID Panel     Status: None   Collection Time: 01/10/23  7:15 PM   Specimen: BLOOD LEFT HAND  Result Value Ref Range Status   Specimen Description BLOOD LEFT HAND  Final   Special Requests   Final    BOTTLES DRAWN AEROBIC AND ANAEROBIC Blood Culture adequate volume   Culture   Final    NO GROWTH 5 DAYS Performed at [facility], [address], [place], [place] [address]    Report Status 01/15/2023 FINAL  Final  MRSA Next Gen by PCR, Nasal     Status: None   Collection Time: 01/10/23  9:16 PM   Specimen: Nasal Mucosa; Nasal Swab  Result Value Ref Range Status   MRSA by PCR Next Gen NOT DETECTED NOT DETECTED Final    Comment: (NOTE) The GeneXpert MRSA Assay (FDA approved for NASAL specimens only), is one component of a comprehensive MRSA colonization surveillance program. It is not intended to diagnose MRSA infection nor to guide or monitor treatment for MRSA infections. Test performance is not FDA approved in patients less than 60 years old. Performed at [facility], [address] [place], [place] [address]   Expectorated Sputum Assessment w Gram Stain, Rflx to Resp Cult     Status: None   Collection Time: 01/13/23  7:10 PM   Specimen: Sputum  Result Value Ref Range Status   Specimen Description SPUTUM  Final   Special Requests  EXPECTORATED  Final   Sputum evaluation   Final    THIS SPECIMEN IS ACCEPTABLE FOR SPUTUM CULTURE Performed at [facility], [address] [place], [place] [address]    Report Status 01/13/2023 FINAL  Final  Culture, Respiratory w Gram Stain     Status: None   Collection Time: 01/13/23  7:10 PM   Specimen: SPU  Result Value Ref Range Status   Specimen Description SPUTUM  Final   Special Requests  EXPECTORATED Reflexed from [id number]  Final   Gram Stain   Final    FEW WBC PRESENT, PREDOMINANTLY PMN RARE GRAM POSITIVE COCCI    Culture   Final    FEW Normal  respiratory flora-no Staph aureus or Pseudomonas seen Performed at [facility], [address] [place], [place] [address]    Report Status 01/16/2023 FINAL  Final  MRSA Next Gen by PCR, Nasal  Status: None   Collection Time: 01/15/23  7:52 AM   Specimen: Nasal Mucosa; Nasal Swab  Result Value Ref Range Status   MRSA by PCR Next Gen NOT DETECTED NOT DETECTED Final    Comment: (NOTE) The GeneXpert MRSA Assay (FDA approved for NASAL specimens only), is one component of a comprehensive MRSA colonization surveillance program. It is not intended to diagnose MRSA infection nor to guide or monitor treatment for MRSA infections. Test performance is not FDA approved in patients less than 86 years old. Performed at [facility], [address], [place], [place] [address]   Respiratory (~20 pathogens) panel by PCR     Status: None   Collection Time: 01/17/23  5:40 AM   Specimen: Nasopharyngeal Swab; Respiratory  Result Value Ref Range Status   Adenovirus NOT DETECTED NOT DETECTED Final   Coronavirus 229E NOT DETECTED NOT DETECTED Final    Comment: (NOTE) The Coronavirus on the Respiratory Panel, DOES NOT test for the novel  Coronavirus (2019 nCoV)    Coronavirus HKU1 NOT DETECTED NOT DETECTED Final   Coronavirus NL63 NOT DETECTED NOT DETECTED Final   Coronavirus OC43 NOT DETECTED NOT DETECTED Final   Metapneumovirus NOT DETECTED NOT DETECTED Final   Rhinovirus / Enterovirus NOT DETECTED NOT DETECTED Final   Influenza A NOT DETECTED NOT DETECTED Final   Influenza B NOT DETECTED NOT DETECTED Final   Parainfluenza Virus 1 NOT DETECTED NOT DETECTED Final   Parainfluenza Virus 2 NOT DETECTED NOT DETECTED Final   Parainfluenza Virus 3 NOT DETECTED NOT DETECTED Final   Parainfluenza Virus 4 NOT DETECTED NOT DETECTED Final   Respiratory Syncytial Virus NOT DETECTED NOT DETECTED Final   Bordetella pertussis NOT DETECTED NOT DETECTED Final   Bordetella Parapertussis NOT DETECTED  NOT DETECTED Final   Chlamydophila pneumoniae NOT DETECTED NOT DETECTED Final   Mycoplasma pneumoniae NOT DETECTED NOT DETECTED Final    Comment: Performed at [facility], [address], [place], [place] [address]    Today   Subjective    Darwin Rothlisberger today has no headache,no chest abdominal pain,no new weakness tingling or numbness, feels much better wants to go home today.    Objective   Blood pressure 117/70, pulse 85, temperature 98 F (36.7 C), temperature source Oral, resp. rate 18, height 5\' 9"  (1.753 m), weight 82.4 kg, SpO2 96 %.   Intake/Output Summary (Last 24 hours) at 01/18/2023 0815 Last data filed at 01/17/2023 1600 Gross per 24 hour  Intake 620 ml  Output --  Net 620 ml    Exam  Awake Alert, No new F.N deficits,    [place].AT,PERRAL Supple Neck,   Symmetrical Chest wall movement, Good air movement bilaterally, CTAB RRR,No Gallops,   +ve B.Sounds, Abd Soft, Non tender,  No Cyanosis, Clubbing or edema    Data Review   Recent Labs  Lab 01/14/23 0500 01/15/23 0818 01/16/23 0328 01/17/23 0319 01/18/23 0419  WBC 12.8* 16.7* 16.2* 16.0* 14.8*  HGB 11.9* 13.0 12.5* 13.5 12.5*  HCT 35.3* 38.2* 37.2* 40.1 38.7*  PLT 174 216 235 286 347  MCV 88.3 88.4 89.2 89.1 91.1  MCH 29.8 30.1 30.0 30.0 29.4  MCHC 33.7 34.0 33.6 33.7 32.3  RDW 15.1 15.6* 15.7* 15.6* 15.5  LYMPHSABS 1.5  --  2.1 3.2 3.0  MONOABS 1.3*  --  1.0 0.6 0.4  EOSABS 0.0  --  0.0 0.2 0.3  BASOSABS 0.0  --  0.0 0.2* 0.3*    Recent  Labs  Lab 01/12/23 0116 01/12/23 0116 01/12/23 0922 01/13/23 0211 01/14/23 0500 01/15/23 0818 01/16/23 0328 01/16/23 2107 01/17/23 0319 01/18/23 0419  NA 139  --   --  139 139 137 138  --  135 135  K 4.3  --   --  3.4* 3.3* 3.3* 3.8  --  3.7 3.9  CL 102  --   --  104 105 102 100  --  99 103  CO2 26  --   --  26 24 27 27   --  27 25  ANIONGAP 11  --   --  9 10 8 11   --  9 7  GLUCOSE 83  --   --  100* 101* 118* 116*  --  112* 109*  BUN 24*  --   --   17 19 20 20   --  24* 29*  CREATININE 1.31*  --   --  0.98 1.04 1.20 0.94  --  1.29* 1.20  AST  --    < >  --  272* 151* 85* 59*  --  58* 40  ALT  --    < >  --  732* 393* 296* 221*  --  183* 121*  ALKPHOS  --    < >  --  89 95 94 87  --  94 82  BILITOT  --    < >  --  1.3* 0.8 0.9 0.5  --  0.8 0.8  ALBUMIN  --    < >  --  2.5* 2.2* 2.5* 2.3*  --  2.5* 2.4*  CRP  --   --   --   --   --  19.9* 11.4*  --  7.8* 4.2*  PROCALCITON  --   --   --   --   --  1.64  --  0.50 0.42 0.19  LATICACIDVEN  --   --  1.8  --   --   --   --   --   --   --   INR  --   --   --   --   --   --  1.2  --   --   --   BNP  --   --   --   --   --  21.1 14.5  --  9.2 6.9  MG 2.7*  --   --  2.1  --  2.2 2.2  --   --   --   CALCIUM 8.6*  --   --  8.2* 8.1* 8.4* 8.8*  --  8.9 8.5*   < > = values in this interval not displayed.    Total Time in preparing paper work, data evaluation and todays exam - 35 minutes  Signature  -    Susa Raring M.D on 01/18/2023 at 8:15 AM   -  To page go to [email or web address]

## 2023-01-18 NOTE — Progress Notes (Signed)
Patient given discharge instructions and cab called to pick patient up. Patient is going to a private rehab house. Patient verbalizes understanding of meds and discharge instructions.

## 2023-01-18 NOTE — Discharge Instructions (Addendum)
Follow with Primary MD [facility] in 7 days get ID referral for hep C status.  Get CBC, CMP, 2 view Chest X ray -  checked next visit with your primary MD   Activity: As tolerated with Full fall precautions use walker/cane & assistance as needed  Disposition Home    Diet: Heart Healthy    Special Instructions: If you have smoked or chewed Tobacco  in the last 2 yrs please stop smoking, stop any regular Alcohol  and or any Recreational drug use.  On your next visit with your primary care physician please Get Medicines reviewed and adjusted.  Please request your Prim.MD to go over all Hospital Tests and Procedure/Radiological results at the follow up, please get all Hospital records sent to your Prim MD by signing hospital release before you go home.  If you experience worsening of your admission symptoms, develop shortness of breath, life threatening emergency, suicidal or homicidal thoughts you must seek medical attention immediately by calling 911 or calling your MD immediately  if symptoms less severe.  You Must read complete instructions/literature along with all the possible adverse reactions/side effects for all the Medicines you take and that have been prescribed to you. Take any new Medicines after you have completely understood and accpet all the possible adverse reactions/side effects.   Do not drive when taking Pain medications.  Do not take more than prescribed Pain, Sleep and Anxiety Medications

## 2023-01-18 NOTE — Plan of Care (Signed)
Patient is discharging home to[place]

## 2023-01-18 NOTE — TOC Transition Note (Signed)
Transition of Care [facility]) - CM/SW Discharge Note   Patient Details  Name: Randall Parsons MRN: [id number] Date of Birth: [birth year]  [name]) CM/SW Contact:  Lawerance Sabal, RN Phone Number: 01/18/2023, 9:32 AM   Clinical Narrative:     Provided with cab voucher to church, address verified.  No other CM needs identified for DC        Patient Goals and CMS Choice      Discharge Placement                         Discharge Plan and Services Additional resources added to the After Visit Summary for                                       Social Determinants of Health (SDOH) Interventions SDOH Screenings   Alcohol Screen: Medium Risk (07/14/2019)  Depression (PHQ2-9): Low Risk  (08/03/2021)  Tobacco Use: Medium Risk (04/27/2022)     Readmission Risk Interventions     No data to display

## 2023-01-18 NOTE — Unmapped (Signed)
Formatting of this note is different from the original.  Images from the original note were not included.  Transition of Care [facility]) - CM/SW Discharge Note    Patient Details   Name: Lucas Hicks  MRN: [id number]  Date of Birth: 10-21-1980    Transition of Care [facility]) CM/SW Contact:   Lawerance Sabal, RN  Phone Number:  01/18/2023, 9:32 AM    Clinical Narrative:       Provided with cab voucher to church, address verified.   No other CM needs identified for DC          Patient Goals and CMS Choice        Discharge Placement                  Discharge Plan and Services  Additional resources added to the After Visit Summary for                                Social Determinants of Health (SDOH) Interventions  SDOH Screenings     Alcohol Screen: Medium Risk (07/14/2019)   Depression (PHQ2-9): Low Risk  (08/03/2021)   Tobacco Use: Medium Risk (04/27/2022)     Readmission Risk Interventions     No data to display             Electronically signed by Lawerance Sabal, RN at 01/18/2023  9:32 AM EDT

## 2023-01-18 NOTE — Discharge Summary (Signed)
Formatting of this note is different from the original.  Images from the original note were not included.        Lucas Hicks ZOX:[id number] DOB: 06-01-1981 DOA: 01/10/2023    PCP: [facility]    Admit date: 01/10/2023  Discharge date: 01/18/2023    Admitted From: Home   Disposition:  Home    Recommendations for Outpatient Follow-up:     Follow up with PCP in 1-2 weeks    PCP Please obtain BMP/CBC, 2 view CXR in 1week,  (see Discharge instructions)     PCP Please follow up on the following pending results: Repeat CBC, CMP, magnesium, 2 view chest x-ray in 7 to 10 days, needs outpatient ID follow-up for hep C positive status.    Home Health: None    Equipment/Devices: None   Consultations: PCCM  Discharge Condition: Stable     CODE STATUS: Full     Diet Recommendation: Heart Healthy     Diet Order          Diet - low sodium heart healthy            Diet Heart Fluid consistency: Thin  Diet effective now                        CC - SOB     Brief history of present illness from the day of admission and additional interim summary      42 year old male patient with history Polysubstance abuse, Hep C, IVDA, depression, ETOH, substance induced mood disorder, he was found unresponsive at the hotel and was brought to the ER unresponsive apneic with pinpoint pupils presumed to have drug overdose, he was found to have pneumonia along with acute hypoxic respiratory failure he was intubated admitted to ICU, stabilized transferred to hospitalist service on 01/15/2023 on day 5 of his hospital stay.                                                                    Hospital Course     Acute hypoxic respiratory failure secondary to aspiration pneumonia, pulmonary edema, NSTEMI, in the setting of acute toxic encephalopathy caused by recreational drug overdose, history of IVDA in the past. He was admitted to the ICU intubated for 24 hours, started on antibiotics, overall improving, currently on nasal cannula oxygen still  completing his antibiotic treatment, tapered off oral steroids, counseled to abstain from all drug use.  Advance activity, encouraged to sit up in chair use I-S and flutter valve for pulmonary toiletry, continue to monitor.  CTA noted on 01/17/2023 with continued bilateral infiltrates.  Now symptom-free on room air, will be discharged on rescue inhaler 3 more days of oral Levaquin with outpatient PCP follow-up.    History of positive hep C.  Outpatient follow-up with GI and ID to be arranged by PCP, patient reminded about it.    Elevated troponin, likely demand ischemia caused by hypoxia from #1 above along with cocaine use.  EKG and echo stable with preserved EF and no wall motion abnormality, chest pain-free, continue to monitor.  Consult to quit doing cocaine and benzodiazepine abuse.    Transaminitis with mild downtrending elevation of lipase.  Likely due to shock liver  caused by hypoxic respiratory failure hypotension and anoxia, trend is improving.  Symptom-free now.  Requested to follow-up with PCP for CMP check and to get referral for hep C follow-up with ID or GI physician.    Ileus induced abdominal pain on 01/15/2023.  Resolved after supportive care.  Feeling back to baseline.      Discharge diagnosis      Principal Problem:    Toxic encephalopathy    Discharge instructions      Discharge Instructions       Diet - low sodium heart healthy   Complete by: As directed     Discharge instructions   Complete by: As directed     Follow with Primary MD [facility] in 7 days, get ID referral for hep C status.    Get CBC, CMP, 2 view Chest X ray -  checked next visit with your primary MD     Activity: As tolerated with Full fall precautions use walker/cane & assistance as needed    Disposition Home      Diet: Heart Healthy      Special Instructions: If you have smoked or chewed Tobacco  in the last 2 yrs please stop smoking, stop any regular Alcohol  and or any Recreational drug use.    On your next  visit with your primary care physician please Get Medicines reviewed and adjusted.    Please request your Prim.MD to go over all Hospital Tests and Procedure/Radiological results at the follow up, please get all Hospital records sent to your Prim MD by signing hospital release before you go home.    If you experience worsening of your admission symptoms, develop shortness of breath, life threatening emergency, suicidal or homicidal thoughts you must seek medical attention immediately by calling 911 or calling your MD immediately  if symptoms less severe.    You Must read complete instructions/literature along with all the possible adverse reactions/side effects for all the Medicines you take and that have been prescribed to you. Take any new Medicines after you have completely understood and accpet all the possible adverse reactions/side effects.     Do not drive when taking Pain medications.  Do not take more than prescribed Pain, Sleep and Anxiety Medications    Increase activity slowly   Complete by: As directed          Discharge Medications     Allergies as of 01/18/2023         Reactions    Amoxicillin Hives, Other (See Comments)    Did it involve swelling of the face/tongue/throat, SOB, or low BP? No  Did it involve sudden or severe rash/hives, skin peeling, or any reaction on the inside of your mouth or nose? Yes  Did you need to seek medical attention at a hospital or doctor's office? Yes  When did it last happen?     42 yrs old  If all above answers are "NO", may proceed with cephalosporin use.    Fish Allergy Hives    Prednisone Other (See Comments)    irritable           Medication List       STOP taking these medications      amLODipine 5 MG tablet  Commonly known as: NORVASC    ibuprofen 800 MG tablet  Commonly known as: ADVIL          TAKE these medications      acetaminophen 500 MG tablet  Commonly known as: TYLENOL  Take 1 tablet (500 mg total) by mouth every 8 (eight) hours as needed for moderate  pain.  What changed:   when to take this  reasons to take this    albuterol 108 (90 Base) MCG/ACT inhaler  Commonly known as: VENTOLIN HFA  Inhale 2 puffs into the lungs every 6 (six) hours as needed for wheezing or shortness of breath (shortness of breath or wheezing).    alfuzosin 10 MG 24 hr tablet  Commonly known as: UROXATRAL  Take 1 tablet by mouth daily.    lamoTRIgine 200 MG tablet  Commonly known as: LAMICTAL  Take 200 mg by mouth daily.    levofloxacin 750 MG tablet  Commonly known as: LEVAQUIN  Take 1 tablet (750 mg total) by mouth daily.           Follow-up Information       [facility] Schedule an appointment as soon as possible for a visit in 1 week(s).    Contact information:  [address]  [place] [address]  [phone]                    Major procedures and Radiology Reports - PLEASE review detailed and final reports thoroughly  -       CT CHEST ABDOMEN PELVIS W CONTRAST    Result Date: 01/16/2023  CLINICAL DATA:  Sepsis. EXAM: CT CHEST, ABDOMEN, AND PELVIS WITH CONTRAST TECHNIQUE: Multidetector CT imaging of the chest, abdomen and pelvis was performed following the standard protocol during bolus administration of intravenous contrast. RADIATION DOSE REDUCTION: This exam was performed according to the departmental dose-optimization program which includes automated exposure control, adjustment of the mA and/or kV according to patient size and/or use of iterative reconstruction technique. CONTRAST:  75mL OMNIPAQUE IOHEXOL 350 MG/ML SOLN COMPARISON:  January 10, 2023 FINDINGS: CT CHEST FINDINGS Cardiovascular: No significant vascular findings. Normal heart size. No pericardial effusion. Right PICC line terminates at the cavoatrial junction. Mediastinum/Nodes: No enlarged mediastinal, hilar, or axillary lymph nodes. Thyroid gland, trachea, and esophagus demonstrate no significant findings. Lungs/Pleura: Persistent widespread bilateral multifocal airspace consolidation  with central and lower lobe predominance. When compared to the prior exam, there is mild improvement in the aeration of the lower lobes. No pleural effusion. Musculoskeletal: No chest wall mass or suspicious bone lesions identified. CT ABDOMEN PELVIS FINDINGS Hepatobiliary: No focal liver abnormality is seen. No gallstones, gallbladder wall thickening, or biliary dilatation. Pancreas: Unremarkable. No pancreatic ductal dilatation or surrounding inflammatory changes. Spleen: Normal in size without focal abnormality. Adrenals/Urinary Tract: Adrenal glands are unremarkable. Kidneys are without focal lesion, or hydronephrosis. Bilateral few mm nonobstructive nephrolithiasis. The largest calculus in the right kidney measures 3 mm. Bladder is unremarkable. Stomach/Bowel: Stomach is within normal limits. No evidence of bowel wall thickening, distention, or inflammatory changes. Vascular/Lymphatic: No significant vascular findings are present. No enlarged abdominal or pelvic lymph nodes. Reproductive: Prostate is unremarkable. Other: No abdominal wall hernia or abnormality. No abdominopelvic ascites. Musculoskeletal: No acute or significant osseous findings. IMPRESSION: Persistent bilateral multifocal pneumonia with mild improvement in the aeration of the lower lobes. Bilateral nonobstructive nephrolithiasis. Electronically Signed   By: Ted Mcalpine M.D.   On: 01/16/2023 12:49     DG Chest 2 View    Result Date: 01/16/2023  CLINICAL DATA:  Pneumonia EXAM: CHEST - 2 VIEW COMPARISON:  01/15/2023 FINDINGS: Increased asymmetric mixed interstitial and airspace opacities, worse in the right upper  lobe but present bilaterally compatible with worsening bilateral pneumonia pattern. Stable heart size and vascularity. No effusion or pneumothorax. Trachea midline. Right PICC line tip SVC RA junction. IMPRESSION: Worsening bilateral multifocal pneumonia pattern, compared with yesterday. Electronically Signed   By: Judie Petit.  Shick M.D.    On: 01/16/2023 09:55     DG Abd Portable 1V    Result Date: 01/16/2023  CLINICAL DATA:  Nausea.  Mid abdominal pain. EXAM: PORTABLE ABDOMEN - 1 VIEW COMPARISON:  01/15/2023 FINDINGS: Similar appearance of mild gaseous distension of the small and large bowel loops without signs to suggest high-grade bowel obstruction. No signs of pneumoperitoneum. Osseous structures are unremarkable. IMPRESSION: Similar appearance of mild gaseous distension of the small and large bowel loops without signs to suggest high-grade bowel obstruction. Findings may reflect ileus. Electronically Signed   By: Signa Kell M.D.   On: 01/16/2023 06:41     DG Abd Portable 1V    Result Date: 01/15/2023  CLINICAL DATA:  Abdominal pain EXAM: PORTABLE ABDOMEN - 1 VIEW COMPARISON:  None Available. FINDINGS: Mild distention of both small and large bowel loops. No supine evidence of free air. No acute osseous abnormality. IMPRESSION: Mild distention of both small and large bowel loops, which may represent ileus. Electronically Signed   By: Allegra Lai M.D.   On: 01/15/2023 18:22     DG Chest Port 1 View    Result Date: 01/15/2023  CLINICAL DATA:  Cough. EXAM: PORTABLE CHEST 1 VIEW COMPARISON:  Yesterday FINDINGS: Mild improvement in bilateral airspace disease which is more extensive on the right. Right PICC with tip at the upper cavoatrial junction. Stable heart size and mediastinal contours. No effusion or pneumothorax. IMPRESSION: Multifocal pneumonia with mildly improved aeration. Electronically Signed   By: Tiburcio Pea M.D.   On: 01/15/2023 06:19     ECHOCARDIOGRAM COMPLETE    Result Date: 01/14/2023     ECHOCARDIOGRAM REPORT   Patient Name:   Lucas Hicks Date of Exam: 01/14/2023 Medical Rec #:  [id number]    Height:       69.0 in Accession #:    [id number]   Weight:       182.5 lb Date of Birth:  02-Jun-1981    BSA:          1.987 m Patient Age:    41 years     BP:           123/97 mmHg Patient Gender: M            HR:           92 bpm. Exam  Location:  Inpatient Procedure: 2D Echo, Color Doppler, Cardiac Doppler and Intracardiac            Opacification Agent Indications:    NSTEMI I21.4  History:        Patient has no prior history of Echocardiogram examinations.                 NSTEMI, Substance Abuse; Risk Factors:Current Smoker.  Sonographer:    Aron Baba Referring Phys: [id number] Lorin Glass  Sonographer Comments: Suboptimal parasternal window. Image acquisition challenging due to respiratory motion. IMPRESSIONS  1. Left ventricular ejection fraction, by estimation, is 60 to 65%. The left ventricle has normal function. The left ventricle has no regional wall motion abnormalities. Left ventricular diastolic parameters were normal.  2. Right ventricular systolic function is normal. The right ventricular size is normal.  3. The mitral valve is normal  in structure. No evidence of mitral valve regurgitation. No evidence of mitral stenosis.  4. The aortic valve is normal in structure. Aortic valve regurgitation is not visualized. No aortic stenosis is present.  5. The inferior vena cava is normal in size with greater than 50% respiratory variability, suggesting right atrial pressure of 3 mmHg. FINDINGS  Left Ventricle: Left ventricular ejection fraction, by estimation, is 60 to 65%. The left ventricle has normal function. The left ventricle has no regional wall motion abnormalities. Definity contrast agent was given IV to delineate the left ventricular  endocardial borders. The left ventricular internal cavity size was normal in size. There is no left ventricular hypertrophy. Left ventricular diastolic parameters were normal. Right Ventricle: The right ventricular size is normal. No increase in right ventricular wall thickness. Right ventricular systolic function is normal. Left Atrium: Left atrial size was normal in size. Right Atrium: Right atrial size was normal in size. Pericardium: There is no evidence of pericardial effusion. Mitral Valve: The  mitral valve is normal in structure. No evidence of mitral valve regurgitation. No evidence of mitral valve stenosis. Tricuspid Valve: The tricuspid valve is normal in structure. Tricuspid valve regurgitation is not demonstrated. No evidence of tricuspid stenosis. Aortic Valve: The aortic valve is normal in structure. Aortic valve regurgitation is not visualized. No aortic stenosis is present. Pulmonic Valve: The pulmonic valve was normal in structure. Pulmonic valve regurgitation is not visualized. No evidence of pulmonic stenosis. Aorta: The aortic root is normal in size and structure. Venous: The inferior vena cava is normal in size with greater than 50% respiratory variability, suggesting right atrial pressure of 3 mmHg. IAS/Shunts: No atrial level shunt detected by color flow Doppler.  LEFT VENTRICLE PLAX 2D LVIDd:         5.20 cm   Diastology LVIDs:         3.40 cm   LV e' medial:    10.10 cm/s LV PW:         1.00 cm   LV E/e' medial:  8.9 LV IVS:        1.00 cm   LV e' lateral:   15.00 cm/s LVOT diam:     1.90 cm   LV E/e' lateral: 6.0 LV SV:         62 LV SV Index:   31 LVOT Area:     2.84 cm  RIGHT VENTRICLE RV S prime:     13.10 cm/s TAPSE (M-mode): 1.6 cm LEFT ATRIUM           Index        RIGHT ATRIUM           Index LA diam:      3.20 cm 1.61 cm/m   RA Area:     14.90 cm LA Vol (A4C): 27.9 ml 14.04 ml/m  RA Volume:   41.70 ml  20.98 ml/m  AORTIC VALVE LVOT Vmax:   136.00 cm/s LVOT Vmean:  89.600 cm/s LVOT VTI:    0.218 m  AORTA Ao Root diam: 3.20 cm Ao Asc diam:  3.80 cm MITRAL VALVE MV Area (PHT): 3.93 cm    SHUNTS MV Decel Time: 193 msec    Systemic VTI:  0.22 m MV E velocity: 90.10 cm/s  Systemic Diam: 1.90 cm MV A velocity: 59.70 cm/s MV E/A ratio:  1.51 Charlton Haws MD Electronically signed by Charlton Haws MD Signature Date/Time: 01/14/2023/2:53:08 PM    Final      DG CHEST PORT  1 VIEW    Result Date: 01/14/2023  CLINICAL DATA:  Respiratory failure EXAM: PORTABLE CHEST 1 VIEW COMPARISON:  CXR  01/12/23 FINDINGS: Right arm PICC with the tip at the cavoatrial junction. There are persistent bilateral patchy airspace opacities, not significantly changed from prior exam, that remain worrisome for infection. No pleural effusion. No pneumothorax. No radiographically apparent displaced rib fractures. Visualized upper abdomen is unremarkable. IMPRESSION: No significant change in bilateral patchy airspace opacities, which remain worrisome for infection. Improved bilateral pleural effusions. Electronically Signed   By: Lorenza Cambridge M.D.   On: 01/14/2023 07:53     DG Chest Port 1 View    Result Date: 01/12/2023  CLINICAL DATA:  Pneumonia. EXAM: PORTABLE CHEST 1 VIEW COMPARISON:  Radiograph yesterday. CT 01/10/2023 FINDINGS: Significant patient rotation on the current exam, lung volumes are low. Tip of the right upper extremity PICC at the atrial caval junction. Heterogeneous bilateral lung opacities with equivocal worsening from yesterday. Small bilateral pleural effusions. No pneumothorax. Stable heart size and mediastinal contours allowing for rotation and low lung volumes. IMPRESSION: 1. Equivocal worsening of bilateral lung opacities since yesterday. 2. Small bilateral pleural effusions. 3. Stable right upper extremity PICC. Electronically Signed   By: Narda Rutherford M.D.   On: 01/12/2023 11:33     DG CHEST PORT 1 VIEW    Result Date: 01/11/2023  CLINICAL DATA:  Hypoxia EXAM: PORTABLE CHEST 1 VIEW COMPARISON:  01/11/2023 FINDINGS: Interval placement of a right sided PICC is seen at the cavoatrial junction. Cardiac shadow is enlarged accentuated by the portable technique. Increasing central vascular congestion is noted. Multifocal airspace opacities are noted slightly increased from the prior study. This is likely related to a poor inspiratory effort. Some superimposed edema could not be totally excluded. IMPRESSION: Patchy airspace opacities bilaterally somewhat increased from the prior exam likely related to a poor  inspiratory effort. New right-sided PICC is seen in satisfactory position. Electronically Signed   By: Alcide Clever M.D.   On: 01/11/2023 21:03     DG Chest Port 1 View    Result Date: 01/11/2023  CLINICAL DATA:  42 year old male with possible aspiration pneumonia. EXAM: PORTABLE CHEST 1 VIEW COMPARISON:  Chest x-ray 01/10/2023. FINDINGS: An endotracheal tube is in place with tip 5.2 cm above the carina. A nasogastric tube is seen extending into the stomach, however, the tip of the nasogastric tube extends below the lower margin of the image. Patchy ill-defined airspace consolidation again noted throughout the mid to lower lungs bilaterally (right greater than left), with slightly improved aeration compared with yesterday's examination. No pleural effusions. No pneumothorax. No evidence of pulmonary edema. Heart size is normal. Upper mediastinal contours are within normal limits. IMPRESSION: 1. Support apparatus, as above. 2. Multilobar bilateral pneumonia redemonstrated with slight improved aeration compared to yesterday's examination. Electronically Signed   By: Trudie Reed M.D.   On: 01/11/2023 05:49     [place] EKG SITE RITE    Result Date: 01/10/2023  If Site Rite image not attached, placement could not be confirmed due to current cardiac rhythm.    CT CHEST ABDOMEN PELVIS WO CONTRAST    Result Date: 01/10/2023  CLINICAL DATA:  Sepsis, OD/sepsis EXAM: CT CHEST, ABDOMEN AND PELVIS WITHOUT CONTRAST TECHNIQUE: Multidetector CT imaging of the chest, abdomen and pelvis was performed following the standard protocol without IV contrast. RADIATION DOSE REDUCTION: This exam was performed according to the departmental dose-optimization program which includes automated exposure control, adjustment of the mA and/or kV  according to patient size and/or use of iterative reconstruction technique. COMPARISON:  Chest radiograph performed earlier on the same date. FINDINGS: CT CHEST FINDINGS Cardiovascular: No significant vascular  findings. Normal heart size. No pericardial effusion. Mediastinum/Nodes: No enlarged mediastinal, hilar, or axillary lymph nodes. Thyroid gland, trachea, and esophagus demonstrate no significant findings. Endotracheal tube and feeding tube coursing below the diaphragm in the body of the stomach. Lungs/Pleura: Bilateral multifocal lung opacities prominent in the dependent portion of the bilateral lower lobes with air bronchograms consistent with multifocal pneumonia. No pleural effusion or pneumothorax. Musculoskeletal: No chest wall mass or suspicious bone lesions identified. CT ABDOMEN PELVIS FINDINGS Hepatobiliary: No focal liver abnormality is seen. No gallstones, gallbladder wall thickening, or biliary dilatation. Pancreas: Unremarkable. No pancreatic ductal dilatation or surrounding inflammatory changes. Spleen: Normal in size without focal abnormality. Adrenals/Urinary Tract: Adrenal glands are unremarkable. Punctate nonobstructing calculus in the upper pole of the left kidney. There is also 4 mm nonobstructing calculus in the interpolar region of the right kidney. No hydronephrosis or ureteral calculus. Foley's catheter in place, urinary bladder is otherwise unremarkable. Stomach/Bowel: Stomach is within normal limits. NG tube with distal tip terminating the body of the stomach. Small bowel loops are normal in caliber. Appendix not identified, however no inflammatory changes in the pericecal region. No evidence of bowel wall thickening, distention, or inflammatory changes. Vascular/Lymphatic: No significant vascular findings are present. No enlarged abdominal or pelvic lymph nodes. Reproductive: Prostate is unremarkable. Other: No abdominal wall hernia or abnormality. No abdominopelvic ascites. Musculoskeletal: No acute or significant osseous findings. IMPRESSION: 1. Bilateral multifocal lung opacities prominent in the dependent portion of the bilateral lower lobes with air bronchograms consistent with  multifocal pneumonia. Follow-up examination to resolution is recommended. 2. Endotracheal tube, feeding tube and Foley's catheter are in appropriate position. 3. No evidence of bowel obstruction. No evidence of colitis or diverticulitis. 4. Bilateral nonobstructing renal calculi. No hydronephrosis or ureteral calculus. Electronically Signed   By: Larose Hires D.O.   On: 01/10/2023 15:16     CT Head Wo Contrast    Result Date: 01/10/2023  CLINICAL DATA:  Provided history: Mental status change, unknown cause. EXAM: CT HEAD WITHOUT CONTRAST TECHNIQUE: Contiguous axial images were obtained from the base of the skull through the vertex without intravenous contrast. RADIATION DOSE REDUCTION: This exam was performed according to the departmental dose-optimization program which includes automated exposure control, adjustment of the mA and/or kV according to patient size and/or use of iterative reconstruction technique. COMPARISON:  No pertinent prior exams available for comparison. FINDINGS: Brain: No age advanced or lobar predominant parenchymal atrophy. 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 There is no acute intracranial hemorrhage. No demarcated cortical infarct. No extra-axial fluid collection. No evidence of an intracranial mass. No midline shift. Vascular: No hyperdense vessel. Atherosclerotic calcifications. Skull: No fracture or aggressive osseous lesion. Sinuses/Orbits: No mass or acute finding within the imaged orbits. No significant paranasal sinus disease. Other: Partially imaged life-support tubes. Secretions within the nasopharynx. IMPRESSION: 1.  No evidence of an acute intracranial abnormality. 2. Chronic right basal ganglia lacunar infarct. Electronically Signed   By: Jackey Loge D.O.   On: 01/10/2023 14:59     DG Chest Port 1 View    Result Date: 01/10/2023  CLINICAL DATA:  Intubation EXAM: PORTABLE CHEST 1 VIEW COMPARISON:  Chest radiograph 07/02/2021 FINDINGS: Endotracheal tube  terminates approximately 4 cm above the carina. Enteric tube courses below diaphragm with the tip projecting over the location of  the stomach and the side hole at the level of the GE junction. No pleural effusion. No pneumothorax. Normal cardiac and mediastinal contours. There are prominent bilateral perihilar airspace opacities, which could represent pulmonary edema or atypical infection. No radiographically apparent displaced rib fractures. IMPRESSION: 1. Prominent bilateral perihilar airspace opacities, which could represent pulmonary edema or atypical infection. If the patient is immunocompromised, fungal infection is also a differential consideration. 2. Endotracheal tube terminates approximately 4 cm above the carina. 3. Enteric tube side hole at the level of the GE junction, recommend advancement by 5 cm. Electronically Signed   By: Lorenza Cambridge M.D.   On: 01/10/2023 12:04      Micro Results     Recent Results (from the past 240 hour(s))   Culture, blood (routine x 2)     Status: None    Collection Time: 01/10/23 11:39 AM    Specimen: BLOOD RIGHT WRIST   Result Value Ref Range Status    Specimen Description BLOOD RIGHT WRIST  Final    Special Requests   Final     BOTTLES DRAWN AEROBIC AND ANAEROBIC Blood Culture adequate volume    Culture   Final     NO GROWTH 5 DAYS  Performed at [facility], [address], [place], [place] [address]     Report Status 01/15/2023 FINAL  Final   Culture, blood (routine x 2)     Status: None    Collection Time: 01/10/23  5:09 PM    Specimen: BLOOD LEFT HAND   Result Value Ref Range Status    Specimen Description BLOOD LEFT HAND  Final    Special Requests   Final     BOTTLES DRAWN AEROBIC AND ANAEROBIC Blood Culture adequate volume    Culture   Final     NO GROWTH 5 DAYS  Performed at [facility], [address], [place], [place] [address]     Report Status 01/15/2023 FINAL  Final   Culture, blood (Routine X 2) w Reflex to ID Panel     Status: None    Collection  Time: 01/10/23  7:15 PM    Specimen: BLOOD LEFT HAND   Result Value Ref Range Status    Specimen Description BLOOD LEFT HAND  Final    Special Requests   Final     BOTTLES DRAWN AEROBIC AND ANAEROBIC Blood Culture adequate volume    Culture   Final     NO GROWTH 5 DAYS  Performed at [facility], [address] [place], [place] [address]     Report Status 01/15/2023 FINAL  Final   MRSA Next Gen by PCR, Nasal     Status: None    Collection Time: 01/10/23  9:16 PM    Specimen: Nasal Mucosa; Nasal Swab   Result Value Ref Range Status    MRSA by PCR Next Gen NOT DETECTED NOT DETECTED Final     Comment: (NOTE)  The GeneXpert MRSA Assay (FDA approved for NASAL specimens only),  is one component of a comprehensive MRSA colonization surveillance  program. It is not intended to diagnose MRSA infection nor to guide  or monitor treatment for MRSA infections.  Test performance is not FDA approved in patients less than 2 years  old.  Performed at [facility], [address], [place],   [address]    Expectorated Sputum Assessment w Gram Stain, Rflx to Resp Cult     Status: None  Collection Time: 01/13/23  7:10 PM    Specimen: Sputum   Result Value Ref Range Status    Specimen Description SPUTUM  Final    Special Requests  EXPECTORATED  Final    Sputum evaluation   Final     THIS SPECIMEN IS ACCEPTABLE FOR SPUTUM CULTURE  Performed at [facility], [address] [place], [place] [address]     Report Status 01/13/2023 FINAL  Final   Culture, Respiratory w Gram Stain     Status: None    Collection Time: 01/13/23  7:10 PM    Specimen: SPU   Result Value Ref Range Status    Specimen Description SPUTUM  Final    Special Requests  EXPECTORATED Reflexed from [id number]  Final    Gram Stain   Final     FEW WBC PRESENT, PREDOMINANTLY PMN  RARE GRAM POSITIVE COCCI     Culture   Final     FEW Normal respiratory flora-no Staph aureus or Pseudomonas seen  Performed at [facility], [address]  [place], [place] [address]     Report Status 01/16/2023 FINAL  Final   MRSA Next Gen by PCR, Nasal     Status: None    Collection Time: 01/15/23  7:52 AM    Specimen: Nasal Mucosa; Nasal Swab   Result Value Ref Range Status    MRSA by PCR Next Gen NOT DETECTED NOT DETECTED Final     Comment: (NOTE)  The GeneXpert MRSA Assay (FDA approved for NASAL specimens only),  is one component of a comprehensive MRSA colonization surveillance  program. It is not intended to diagnose MRSA infection nor to guide  or monitor treatment for MRSA infections.  Test performance is not FDA approved in patients less than 2 years  old.  Performed at [facility], [address], [place], [place]  [address]    Respiratory (~20 pathogens) panel by PCR     Status: None    Collection Time: 01/17/23  5:40 AM    Specimen: Nasopharyngeal Swab; Respiratory   Result Value Ref Range Status    Adenovirus NOT DETECTED NOT DETECTED Final    Coronavirus 229E NOT DETECTED NOT DETECTED Final     Comment: (NOTE)  The Coronavirus on the Respiratory Panel, DOES NOT test for the novel   Coronavirus (2019 nCoV)     Coronavirus HKU1 NOT DETECTED NOT DETECTED Final    Coronavirus NL63 NOT DETECTED NOT DETECTED Final    Coronavirus OC43 NOT DETECTED NOT DETECTED Final    Metapneumovirus NOT DETECTED NOT DETECTED Final    Rhinovirus / Enterovirus NOT DETECTED NOT DETECTED Final    Influenza A NOT DETECTED NOT DETECTED Final    Influenza B NOT DETECTED NOT DETECTED Final    Parainfluenza Virus 1 NOT DETECTED NOT DETECTED Final    Parainfluenza Virus 2 NOT DETECTED NOT DETECTED Final    Parainfluenza Virus 3 NOT DETECTED NOT DETECTED Final    Parainfluenza Virus 4 NOT DETECTED NOT DETECTED Final    Respiratory Syncytial Virus NOT DETECTED NOT DETECTED Final    Bordetella pertussis NOT DETECTED NOT DETECTED Final    Bordetella Parapertussis NOT DETECTED NOT DETECTED Final    Chlamydophila pneumoniae NOT DETECTED NOT DETECTED Final    Mycoplasma pneumoniae NOT  DETECTED NOT DETECTED Final     Comment: Performed at [facility], [address], [place], [place] [address]     Today  Subjective     Lucas Hicks today has no headache,no chest abdominal pain,no new weakness tingling or numbness, feels much better wants to go home today.     Objective     Blood pressure 117/70, pulse 85, temperature 98 F (36.7 C), temperature source Oral, resp. rate 18, height 5\' 9"  (1.753 m), weight 82.4 kg, SpO2 96 %.    Intake/Output Summary (Last 24 hours) at 01/18/2023 0815  Last data filed at 01/17/2023 1600  Gross per 24 hour   Intake 620 ml   Output --   Net 620 ml     Exam    Awake Alert, No new F.N deficits,     NC.AT,PERRAL  Supple Neck,    Symmetrical Chest wall movement, Good air movement bilaterally, CTAB  RRR,No Gallops,    +ve B.Sounds, Abd Soft, Non tender,   No Cyanosis, Clubbing or edema      Data Review     Recent Labs   Lab 01/14/23  0500 01/15/23  0818 01/16/23  0328 01/17/23  0319 01/18/23  0419   WBC 12.8* 16.7* 16.2* 16.0* 14.8*   HGB 11.9* 13.0 12.5* 13.5 12.5*   HCT 35.3* 38.2* 37.2* 40.1 38.7*   PLT 174 216 235 286 347   MCV 88.3 88.4 89.2 89.1 91.1   MCH 29.8 30.1 30.0 30.0 29.4   MCHC 33.7 34.0 33.6 33.7 32.3   RDW 15.1 15.6* 15.7* 15.6* 15.5   LYMPHSABS 1.5  --  2.1 3.2 3.0   MONOABS 1.3*  --  1.0 0.6 0.4   EOSABS 0.0  --  0.0 0.2 0.3   BASOSABS 0.0  --  0.0 0.2* 0.3*     Recent Labs   Lab 01/12/23  0116 01/12/23  0116 01/12/23  0922 01/13/23  0211 01/14/23  0500 01/15/23  0818 01/16/23  0328 01/16/23  2107 01/17/23  0319 01/18/23  0419   NA 139  --   --  139 139 137 138  --  135 135   K 4.3  --   --  3.4* 3.3* 3.3* 3.8  --  3.7 3.9   CL 102  --   --  104 105 102 100  --  99 103   CO2 26  --   --  26 24 27 27   --  27 25   ANIONGAP 11  --   --  9 10 8 11   --  9 7   GLUCOSE 83  --   --  100* 101* 118* 116*  --  112* 109*   BUN 24*  --   --  17 19 20 20   --  24* 29*   CREATININE 1.31*  --   --  0.98 1.04 1.20 0.94  --  1.29* 1.20   AST  --    < >  --  272*  151* 85* 59*  --  58* 40   ALT  --    < >  --  732* 393* 296* 221*  --  183* 121*   ALKPHOS  --    < >  --  89 95 94 87  --  94 82   BILITOT  --    < >  --  1.3* 0.8 0.9 0.5  --  0.8 0.8   ALBUMIN  --    < >  --  2.5* 2.2* 2.5* 2.3*  --  2.5* 2.4*   CRP  --   --   --   --   --  19.9* 11.4*  --  7.8* 4.2*   PROCALCITON  --   --   --   --   --  1.64  --  0.50 0.42 0.19   LATICACIDVEN  --   --  1.8  --   --   --   --   --   --   --    INR  --   --   --   --   --   --  1.2  --   --   --    BNP  --   --   --   --   --  21.1 14.5  --  9.2 6.9   MG 2.7*  --   --  2.1  --  2.2 2.2  --   --   --    CALCIUM 8.6*  --   --  8.2* 8.1* 8.4* 8.8*  --  8.9 8.5*    < > = values in this interval not displayed.     Total Time in preparing paper work, data evaluation and todays exam - 35 minutes    Signature  -    Susa Raring M.D on 01/18/2023 at 8:15 AM   -  To page go to [email or web address]         Electronically signed by Leroy Sea, MD at 01/18/2023  8:15 AM EDT

## 2023-01-18 NOTE — Unmapped (Signed)
Formatting of this note might be different from the original.  Patient is discharging home to[place]   Electronically signed by Roselee Nova, RN at 01/18/2023 10:17 AM EDT

## 2023-01-18 NOTE — Progress Notes (Signed)
Formatting of this note might be different from the original.  Patient given discharge instructions and cab called to pick patient up. Patient is going to a private rehab house. Patient verbalizes understanding of meds and discharge instructions.   Electronically signed by Roselee Nova, RN at 01/18/2023 10:51 AM EDT

## 2023-01-30 ENCOUNTER — Encounter (HOSPITAL_COMMUNITY): Payer: Self-pay

## 2023-01-30 ENCOUNTER — Emergency Department (HOSPITAL_COMMUNITY)
Admission: EM | Admit: 2023-01-30 | Discharge: 2023-01-30 | Disposition: A | Discharge status: Home / Self Care | Payer: Medicaid Other | Attending: Emergency Medicine | Admitting: Emergency Medicine

## 2023-01-30 ENCOUNTER — Other Ambulatory Visit: Payer: Self-pay

## 2023-01-30 DIAGNOSIS — L089 Local infection of the skin and subcutaneous tissue, unspecified: Secondary | ICD-10-CM

## 2023-01-30 DIAGNOSIS — L02511 Cutaneous abscess of right hand: Secondary | ICD-10-CM | POA: Insufficient documentation

## 2023-01-30 MED ORDER — LIDOCAINE-EPINEPHRINE (PF) 2 %-1:200000 IJ SOLN
10.0000 mL | Freq: Once | INTRAMUSCULAR | Status: AC
  Administered 2023-01-30: 10 mL via INTRADERMAL
  Filled 2023-01-30: qty 20

## 2023-01-30 MED ORDER — DOXYCYCLINE HYCLATE 100 MG PO TABS
100.0000 mg | ORAL_TABLET | Freq: Once | ORAL | Status: AC
  Administered 2023-01-30: 100 mg via ORAL
  Filled 2023-01-30: qty 1

## 2023-01-30 MED ORDER — DOXYCYCLINE HYCLATE 100 MG PO CAPS: 100.0000 mg | ORAL_CAPSULE | Freq: Two times a day (BID) | ORAL | 0 refills | Status: DC

## 2023-01-30 NOTE — Discharge Instructions (Signed)
I would have you do warm compresses 4 times a day.  Take the antibiotics as prescribed.  Please follow-up with your family doctor in the office.

## 2023-01-30 NOTE — ED Provider Notes (Signed)
[place] EMERGENCY DEPARTMENT AT [facility] Provider Note   CSN: [id number] Arrival date & time: 01/30/23  0153     History  Chief Complaint  Patient presents with   Finger Injury    Randall Parsons is a 42 y.o. male.  42 yo M with a chief complaints of right second digit pain.  He tells me has been going on for about 3 days and then suddenly today got much worse.  He denies any obvious injury to it.  He is worried that maybe he has an infection there.  No fevers.  Denies trauma denies burning.        Home Medications Prior to Admission medications   Medication Sig Start Date End Date Taking? Authorizing Provider  doxycycline (VIBRAMYCIN) 100 MG capsule Take 1 capsule (100 mg total) by mouth 2 (two) times daily. One po bid x 7 days 01/30/23  Yes Melene Plan, DO  acetaminophen (TYLENOL) 500 MG tablet Take 1 tablet (500 mg total) by mouth every 8 (eight) hours as needed for moderate pain. 01/18/23   Leroy Sea, MD  albuterol (VENTOLIN HFA) 108 (90 Base) MCG/ACT inhaler Inhale 2 puffs into the lungs every 6 (six) hours as needed for wheezing or shortness of breath (shortness of breath or wheezing). 01/18/23   Leroy Sea, MD  alfuzosin (UROXATRAL) 10 MG 24 hr tablet Take 1 tablet by mouth daily.    [provider]  lamoTRIgine (LAMICTAL) 200 MG tablet Take 200 mg by mouth daily.    [provider]  levofloxacin (LEVAQUIN) 750 MG tablet Take 1 tablet (750 mg total) by mouth daily. 01/18/23   Leroy Sea, MD  pantoprazole (PROTONIX) 40 MG tablet Take 1 tablet (40 mg total) by mouth daily. 07/17/19 08/03/19  Clapacs, Jackquline Denmark, MD      Allergies    Amoxicillin, Fish allergy, and Prednisone    Review of Systems   Review of Systems  Physical Exam Updated Vital Signs BP (!) 132/102 (BP Location: Left Arm)   Pulse 74   Temp 98.2 F (36.8 C) (Oral)   Ht  (1.753 m)   Wt 74.8 kg   SpO2 97%   BMI 24.37 kg/m  Physical  Exam Vitals and nursing note reviewed.  Constitutional:      Appearance: He is well-developed.     Comments: Patient is writhing around on the stretcher yelling and screaming  HENT:     Head: Normocephalic and atraumatic.  Eyes:     Pupils: Pupils are equal, round, and reactive to light.  Neck:     Vascular: No JVD.  Cardiovascular:     Rate and Rhythm: Normal rate and regular rhythm.     Heart sounds: No murmur heard.    No friction rub. No gallop.  Pulmonary:     Effort: No respiratory distress.     Breath sounds: No wheezing.  Abdominal:     General: There is no distension.     Tenderness: There is no abdominal tenderness. There is no guarding or rebound.  Musculoskeletal:        General: Normal range of motion.     Cervical back: Normal range of motion and neck supple.     Comments: Along the dorsal aspect of the right second digit the patient has some blistering.  Able to extend the digit without significant issue.  No pain along the flexor tendon.  Patient's fingers diffusely have small little burn marks to  them.  Skin:    Coloration: Skin is not pale.     Findings: No rash.  Neurological:     Mental Status: [name] to person, place, and time.  Psychiatric:        Behavior: Behavior normal.     ED Results / Procedures / Treatments   Labs (all labs ordered are listed, but only abnormal results are displayed) Labs Reviewed - No data to display  EKG None  Radiology No results found.  Procedures .Marland KitchenIncision and Drainage  Date/Time: 01/30/2023 2:24 AM  Performed by: Melene Plan, DO Authorized by: Melene Plan, DO   Consent:    Consent obtained:  Verbal   Consent given by:  Patient   Risks, benefits, and alternatives were discussed: yes     Risks discussed:  Bleeding, incomplete drainage and infection   Alternatives discussed:  No treatment, delayed treatment and alternative treatment Universal protocol:    Procedure explained and questions  answered to patient or proxy's satisfaction: yes     Patient identity confirmed:  Verbally with patient Location:    Type:  Abscess Pre-procedure details:    Skin preparation:  Chlorhexidine Sedation:    Sedation type:  None Anesthesia:    Anesthesia method:  Nerve block   Block location:  Digital block   Block needle gauge:  27 G   Block anesthetic:  Lidocaine 2% WITH epi   Block technique:  Digital   Block injection procedure:  Anatomic landmarks identified, anatomic landmarks palpated, introduced needle and negative aspiration for blood   Block outcome:  Anesthesia achieved Procedure type:    Complexity:  Complex Procedure details:    Incision types:  Single straight   Incision depth:  Subcutaneous   Wound management:  Probed and deloculated   Drainage:  Bloody and purulent   Drainage amount:  Copious   Wound treatment:  Wound left open   Packing materials:  None Post-procedure details:    Procedure completion:  Tolerated well, no immediate complications     Medications Ordered in ED Medications  lidocaine-EPINEPHrine (XYLOCAINE W/EPI) 2 %-1:200000 (PF) injection 10 mL (has no administration in time range)  doxycycline (VIBRA-TABS) tablet 100 mg (has no administration in time range)    ED Course/ Medical Decision Making/ A&P                             Medical Decision Making Risk Prescription drug management.   42 yo M with a chief complaints of pain to the right second digit.  This been going on for couple days.  Got quite a bit worse today.  The patient on my record review has a significant past medical history of substance abuse.  Patient feeling better after digital block.  The blistered area was drained, there was some purulence to it.  Will start on antibiotics.  2:25 AM:  I have discussed the diagnosis/risks/treatment options with the patient.  Evaluation and diagnostic testing in the emergency department does not suggest an emergent condition requiring  admission or immediate intervention beyond what has been performed at this time.  They will follow up with PCP. We also discussed returning to the ED immediately if new or worsening sx occur. We discussed the sx which are most concerning (e.g., sudden worsening pain, fever, inability to tolerate by mouth) that necessitate immediate return. Medications administered to the patient during their visit and any new prescriptions provided to the patient  are listed below.  Medications given during this visit Medications  lidocaine-EPINEPHrine (XYLOCAINE W/EPI) 2 %-1:200000 (PF) injection 10 mL (has no administration in time range)  doxycycline (VIBRA-TABS) tablet 100 mg (has no administration in time range)     The patient appears reasonably screen and/or stabilized for discharge and I doubt any other medical condition or other [facility] requiring further screening, evaluation, or treatment in the ED at this time prior to discharge.          Final Clinical Impression(s) / ED Diagnoses Final diagnoses:  Finger infection    Rx / DC Orders ED Discharge Orders          Ordered    doxycycline (VIBRAMYCIN) 100 MG capsule  2 times daily        01/30/23 0223              Melene Plan, DO 01/30/23 0225

## 2023-01-30 NOTE — ED Triage Notes (Signed)
Pt c/o rt 1st finger pain x 2 days, finger now blistered, c/o 10/10 pain, unable to keep still

## 2023-01-30 NOTE — ED Triage Notes (Signed)
Formatting of this note might be different from the original.  Pt c/o rt 1st finger pain x 2 days, finger now blistered, c/o 10/10 pain, unable to keep still  Electronically signed by Hale Drone, RN at 01/30/2023  1:50 AM EDT

## 2023-01-30 NOTE — ED Provider Notes (Signed)
Associated Order(s): Marland KitchenMarland KitchenIncision and Drainage  Formatting of this note is different from the original.  Images from the original note were not included.    [facility] EMERGENCY DEPARTMENT AT [facility]  Provider Note    CSN: [id number]  Arrival date & time: 01/30/23  0153        History    Chief Complaint   Patient presents with    Finger Injury     Lucas Hicks is a 42 y.o. male.    42 yo M with a chief complaints of right second digit pain.  He tells me has been going on for about 3 days and then suddenly today got much worse.  He denies any obvious injury to it.  He is worried that maybe he has an infection there.  No fevers.  Denies trauma denies burning.        Home Medications  Prior to Admission medications    Medication Sig Start Date End Date Taking? Authorizing Provider   doxycycline (VIBRAMYCIN) 100 MG capsule Take 1 capsule (100 mg total) by mouth 2 (two) times daily. One po bid x 7 days 01/30/23  Yes Melene Plan, DO   acetaminophen (TYLENOL) 500 MG tablet Take 1 tablet (500 mg total) by mouth every 8 (eight) hours as needed for moderate pain. 01/18/23   Leroy Sea, MD   albuterol (VENTOLIN HFA) 108 (90 Base) MCG/ACT inhaler Inhale 2 puffs into the lungs every 6 (six) hours as needed for wheezing or shortness of breath (shortness of breath or wheezing). 01/18/23   Leroy Sea, MD   alfuzosin (UROXATRAL) 10 MG 24 hr tablet Take 1 tablet by mouth daily.    [provider]   lamoTRIgine (LAMICTAL) 200 MG tablet Take 200 mg by mouth daily.    [provider]   levofloxacin (LEVAQUIN) 750 MG tablet Take 1 tablet (750 mg total) by mouth daily. 01/18/23   Leroy Sea, MD   pantoprazole (PROTONIX) 40 MG tablet Take 1 tablet (40 mg total) by mouth daily. 07/17/19 08/03/19  Clapacs, Jackquline Denmark, MD       Allergies     Amoxicillin, Fish allergy, and Prednisone      Review of Systems    Review of Systems    Physical Exam  Updated Vital Signs  BP (!) 132/102 (BP Location:  Left Arm)   Pulse 74   Temp 98.2 F (36.8 C) (Oral)   Ht 5\' 9"  (1.753 m)   Wt 74.8 kg   SpO2 97%   BMI 24.37 kg/m   Physical Exam  Vitals and nursing note reviewed.   Constitutional:       Appearance: He is well-developed.      Comments: Patient is writhing around on the stretcher yelling and screaming   HENT:      Head: Normocephalic and atraumatic.   Eyes:      Pupils: Pupils are equal, round, and reactive to light.   Neck:      Vascular: No JVD.   Cardiovascular:      Rate and Rhythm: Normal rate and regular rhythm.      Heart sounds: No murmur heard.     No friction rub. No gallop.   Pulmonary:      Effort: No respiratory distress.      Breath sounds: No wheezing.   Abdominal:      General: There is no distension.      Tenderness: There is no abdominal  tenderness. There is no guarding or rebound.   Musculoskeletal:         General: Normal range of motion.      Cervical back: Normal range of motion and neck supple.      Comments: Along the dorsal aspect of the right second digit the patient has some blistering.  Able to extend the digit without significant issue.  No pain along the flexor tendon.  Patient's fingers diffusely have small little burn marks to them.   Skin:     Coloration: Skin is not pale.      Findings: No rash.   Neurological:      Mental Status: [name] to person, place, and time.   Psychiatric:         Behavior: Behavior normal.     ED Results / Procedures / Treatments    Labs  (all labs ordered are listed, but only abnormal results are displayed)  Labs Reviewed - No data to display    EKG  None    Radiology  No results found.    Procedures  .Marland KitchenIncision and Drainage    Date/Time: 01/30/2023 2:24 AM    Performed by: Melene Plan, DO  Authorized by: Melene Plan, DO    Consent:     Consent obtained:  Verbal    Consent given by:  Patient    Risks, benefits, and alternatives were discussed: yes      Risks discussed:  Bleeding, incomplete drainage and infection    Alternatives  discussed:  No treatment, delayed treatment and alternative treatment  Universal protocol:     Procedure explained and questions answered to patient or proxy's satisfaction: yes      Patient identity confirmed:  Verbally with patient  Location:     Type:  Abscess  Pre-procedure details:     Skin preparation:  Chlorhexidine  Sedation:     Sedation type:  None  Anesthesia:     Anesthesia method:  Nerve block    Block location:  Digital block    Block needle gauge:  27 G    Block anesthetic:  Lidocaine 2% WITH epi    Block technique:  Digital    Block injection procedure:  Anatomic landmarks identified, anatomic landmarks palpated, introduced needle and negative aspiration for blood    Block outcome:  Anesthesia achieved  Procedure type:     Complexity:  Complex  Procedure details:     Incision types:  Single straight    Incision depth:  Subcutaneous    Wound management:  Probed and deloculated    Drainage:  Bloody and purulent    Drainage amount:  Copious    Wound treatment:  Wound left open    Packing materials:  None  Post-procedure details:     Procedure completion:  Tolerated well, no immediate complications      Medications Ordered in ED  Medications   lidocaine-EPINEPHrine (XYLOCAINE W/EPI) 2 %-1:200000 (PF) injection 10 mL (has no administration in time range)   doxycycline (VIBRA-TABS) tablet 100 mg (has no administration in time range)     ED Course/ Medical Decision Making/ A&P        Medical Decision Making  Risk  Prescription drug management.    42 yo M with a chief complaints of pain to the right second digit.  This been going on for couple days.  Got quite a bit worse today.  The patient on my record review has a significant past medical  history of substance abuse.    Patient feeling better after digital block.  The blistered area was drained, there was some purulence to it.  Will start on antibiotics.    2:25 AM:  I have discussed the diagnosis/risks/treatment options with the patient.  Evaluation and  diagnostic testing in the emergency department does not suggest an emergent condition requiring admission or immediate intervention beyond what has been performed at this time.  They will follow up with PCP. We also discussed returning to the ED immediately if new or worsening sx occur. We discussed the sx which are most concerning (e.g., sudden worsening pain, fever, inability to tolerate by mouth) that necessitate immediate return. Medications administered to the patient during their visit and any new prescriptions provided to the patient are listed below.    Medications given during this visit  Medications   lidocaine-EPINEPHrine (XYLOCAINE W/EPI) 2 %-1:200000 (PF) injection 10 mL (has no administration in time range)   doxycycline (VIBRA-TABS) tablet 100 mg (has no administration in time range)     The patient appears reasonably screen and/or stabilized for discharge and I doubt any other medical condition or other [facility] requiring further screening, evaluation, or treatment in the ED at this time prior to discharge.     Final Clinical Impression(s) / ED Diagnoses  Final diagnoses:   Finger infection     Rx / DC Orders  ED Discharge Orders            Ordered     doxycycline (VIBRAMYCIN) 100 MG capsule  2 times daily         01/30/23 0223                 Melene Plan, DO  01/30/23 0225    Electronically signed by Melene Plan, DO at 01/30/2023  2:25 AM EDT

## 2023-01-31 ENCOUNTER — Other Ambulatory Visit: Payer: Self-pay

## 2023-01-31 ENCOUNTER — Encounter (HOSPITAL_COMMUNITY): Payer: Self-pay

## 2023-01-31 ENCOUNTER — Observation Stay (HOSPITAL_COMMUNITY): Payer: Medicaid Other | Admitting: Certified Registered"

## 2023-01-31 ENCOUNTER — Inpatient Hospital Stay (HOSPITAL_COMMUNITY)
Admission: EM | Admit: 2023-01-31 | Discharge: 2023-02-02 | DRG: 603 | Disposition: A | Discharge status: Home / Self Care | Payer: Medicaid Other | Attending: Internal Medicine | Admitting: Internal Medicine

## 2023-01-31 ENCOUNTER — Encounter (HOSPITAL_COMMUNITY)
Admission: EM | Disposition: A | Discharge status: Home / Self Care | Payer: Self-pay | Source: Home / Self Care | Attending: Internal Medicine

## 2023-01-31 ENCOUNTER — Observation Stay (HOSPITAL_BASED_OUTPATIENT_CLINIC_OR_DEPARTMENT_OTHER): Payer: Medicaid Other | Admitting: Certified Registered"

## 2023-01-31 DIAGNOSIS — L02511 Cutaneous abscess of right hand: Secondary | ICD-10-CM | POA: Diagnosis not present

## 2023-01-31 DIAGNOSIS — Z91148 Patient's other noncompliance with medication regimen for other reason: Secondary | ICD-10-CM

## 2023-01-31 DIAGNOSIS — J45909 Unspecified asthma, uncomplicated: Secondary | ICD-10-CM | POA: Diagnosis present

## 2023-01-31 DIAGNOSIS — F22 Delusional disorders: Secondary | ICD-10-CM | POA: Diagnosis not present

## 2023-01-31 DIAGNOSIS — Z880 Allergy status to penicillin: Secondary | ICD-10-CM

## 2023-01-31 DIAGNOSIS — B182 Chronic viral hepatitis C: Secondary | ICD-10-CM | POA: Diagnosis present

## 2023-01-31 DIAGNOSIS — I509 Heart failure, unspecified: Secondary | ICD-10-CM | POA: Diagnosis not present

## 2023-01-31 DIAGNOSIS — I252 Old myocardial infarction: Secondary | ICD-10-CM | POA: Diagnosis not present

## 2023-01-31 DIAGNOSIS — I110 Hypertensive heart disease with heart failure: Secondary | ICD-10-CM

## 2023-01-31 DIAGNOSIS — Z811 Family history of alcohol abuse and dependence: Secondary | ICD-10-CM

## 2023-01-31 DIAGNOSIS — Z8619 Personal history of other infectious and parasitic diseases: Secondary | ICD-10-CM | POA: Diagnosis present

## 2023-01-31 DIAGNOSIS — Z803 Family history of malignant neoplasm of breast: Secondary | ICD-10-CM

## 2023-01-31 DIAGNOSIS — F14150 Cocaine abuse with cocaine-induced psychotic disorder with delusions: Secondary | ICD-10-CM | POA: Diagnosis present

## 2023-01-31 DIAGNOSIS — Z91013 Allergy to seafood: Secondary | ICD-10-CM

## 2023-01-31 DIAGNOSIS — L02519 Cutaneous abscess of unspecified hand: Secondary | ICD-10-CM | POA: Diagnosis present

## 2023-01-31 DIAGNOSIS — Z87891 Personal history of nicotine dependence: Secondary | ICD-10-CM

## 2023-01-31 DIAGNOSIS — F411 Generalized anxiety disorder: Secondary | ICD-10-CM | POA: Diagnosis not present

## 2023-01-31 DIAGNOSIS — Z8673 Personal history of transient ischemic attack (TIA), and cerebral infarction without residual deficits: Secondary | ICD-10-CM

## 2023-01-31 DIAGNOSIS — Z808 Family history of malignant neoplasm of other organs or systems: Secondary | ICD-10-CM

## 2023-01-31 DIAGNOSIS — Z888 Allergy status to other drugs, medicaments and biological substances status: Secondary | ICD-10-CM

## 2023-01-31 DIAGNOSIS — F17290 Nicotine dependence, other tobacco product, uncomplicated: Secondary | ICD-10-CM | POA: Diagnosis present

## 2023-01-31 DIAGNOSIS — E876 Hypokalemia: Secondary | ICD-10-CM

## 2023-01-31 DIAGNOSIS — Z87442 Personal history of urinary calculi: Secondary | ICD-10-CM

## 2023-01-31 DIAGNOSIS — F14159 Cocaine abuse with cocaine-induced psychotic disorder, unspecified: Secondary | ICD-10-CM | POA: Diagnosis present

## 2023-01-31 DIAGNOSIS — F39 Unspecified mood [affective] disorder: Secondary | ICD-10-CM | POA: Diagnosis present

## 2023-01-31 DIAGNOSIS — F1410 Cocaine abuse, uncomplicated: Secondary | ICD-10-CM

## 2023-01-31 DIAGNOSIS — Z8701 Personal history of pneumonia (recurrent): Secondary | ICD-10-CM

## 2023-01-31 DIAGNOSIS — I10 Essential (primary) hypertension: Secondary | ICD-10-CM | POA: Diagnosis present

## 2023-01-31 HISTORY — DX: Unspecified asthma, uncomplicated: J45.909

## 2023-01-31 HISTORY — DX: Cerebral infarction, unspecified: I63.9

## 2023-01-31 HISTORY — DX: Anxiety disorder, unspecified: F41.9

## 2023-01-31 HISTORY — PX: I & D EXTREMITY: SHX5045

## 2023-01-31 HISTORY — DX: Personal history of urinary calculi: Z87.442

## 2023-01-31 HISTORY — DX: Acute myocardial infarction, unspecified: I21.9

## 2023-01-31 HISTORY — DX: Depression, unspecified: F32.A

## 2023-01-31 LAB — CBC WITH DIFFERENTIAL/PLATELET
Abs Immature Granulocytes: 0.02 10*3/uL (ref 0.00–0.07)
Basophils Absolute: 0 10*3/uL (ref 0.0–0.1)
Basophils Relative: 1 %
Eosinophils Absolute: 0.1 10*3/uL (ref 0.0–0.5)
Eosinophils Relative: 1 %
HCT: 35.6 % — ABNORMAL LOW (ref 39.0–52.0)
Hemoglobin: 11.4 g/dL — ABNORMAL LOW (ref 13.0–17.0)
Immature Granulocytes: 0 %
Lymphocytes Relative: 28 %
Lymphs Abs: 1.6 10*3/uL (ref 0.7–4.0)
MCH: 29.2 pg (ref 26.0–34.0)
MCHC: 32 g/dL (ref 30.0–36.0)
MCV: 91.3 fL (ref 80.0–100.0)
Monocytes Absolute: 0.7 10*3/uL (ref 0.1–1.0)
Monocytes Relative: 12 %
Neutro Abs: 3.3 10*3/uL (ref 1.7–7.7)
Neutrophils Relative %: 58 %
Platelets: 348 10*3/uL (ref 150–400)
RBC: 3.9 MIL/uL — ABNORMAL LOW (ref 4.22–5.81)
RDW: 14 % (ref 11.5–15.5)
WBC: 5.8 10*3/uL (ref 4.0–10.5)
nRBC: 0 % (ref 0.0–0.2)

## 2023-01-31 LAB — BASIC METABOLIC PANEL
Anion gap: 10 (ref 5–15)
BUN: 8 mg/dL (ref 6–20)
CO2: 23 mmol/L (ref 22–32)
Calcium: 8.4 mg/dL — ABNORMAL LOW (ref 8.9–10.3)
Chloride: 103 mmol/L (ref 98–111)
Creatinine, Ser: 0.88 mg/dL (ref 0.61–1.24)
GFR, Estimated: >60 mL/min (ref >60)
Glucose, Bld: 110 mg/dL — ABNORMAL HIGH (ref 70–99)
Potassium: 3.1 mmol/L — ABNORMAL LOW (ref 3.5–5.1)
Sodium: 136 mmol/L (ref 135–145)

## 2023-01-31 LAB — AEROBIC/ANAEROBIC CULTURE W GRAM STAIN (SURGICAL/DEEP WOUND)

## 2023-01-31 LAB — ETHANOL: Alcohol, Ethyl (B): <10 mg/dL (ref <10)

## 2023-01-31 SURGERY — IRRIGATION AND DEBRIDEMENT EXTREMITY
Anesthesia: General | Laterality: Right

## 2023-01-31 MED ORDER — VANCOMYCIN HCL 1500 MG/300ML IV SOLN
1500.0000 mg | Freq: Once | INTRAVENOUS | Status: AC
  Administered 2023-01-31: 1500 mg via INTRAVENOUS
  Filled 2023-01-31: qty 300

## 2023-01-31 MED ORDER — DOXYCYCLINE HYCLATE 100 MG PO TABS
100.0000 mg | ORAL_TABLET | Freq: Once | ORAL | Status: AC
  Administered 2023-01-31: 100 mg via ORAL
  Filled 2023-01-31: qty 1

## 2023-01-31 MED ORDER — ACETAMINOPHEN 650 MG RE SUPP: 650.0000 mg | Freq: Four times a day (QID) | RECTAL | Status: DC | PRN

## 2023-01-31 MED ORDER — MIDAZOLAM HCL 2 MG/2ML IJ SOLN
INTRAMUSCULAR | Status: AC
  Filled 2023-01-31: qty 2

## 2023-01-31 MED ORDER — METRONIDAZOLE 500 MG/100ML IV SOLN
500.0000 mg | Freq: Two times a day (BID) | INTRAVENOUS | Status: DC
  Administered 2023-01-31 – 2023-02-01 (×2): 500 mg via INTRAVENOUS
  Filled 2023-01-31: qty 100

## 2023-01-31 MED ORDER — VANCOMYCIN HCL IN DEXTROSE 1-5 GM/200ML-% IV SOLN: 1000.0000 mg | INTRAVENOUS | Status: AC

## 2023-01-31 MED ORDER — SUCCINYLCHOLINE CHLORIDE 200 MG/10ML IV SOSY
PREFILLED_SYRINGE | INTRAVENOUS | Status: DC | PRN
  Administered 2023-01-31: 100 mg via INTRAVENOUS

## 2023-01-31 MED ORDER — DEXMEDETOMIDINE HCL IN NACL 80 MCG/20ML IV SOLN
INTRAVENOUS | Status: DC | PRN
  Administered 2023-01-31: 8 ug via INTRAVENOUS
  Administered 2023-01-31: 12 ug via INTRAVENOUS

## 2023-01-31 MED ORDER — CHLORHEXIDINE GLUCONATE 4 % EX SOLN: 60.0000 mL | Freq: Once | CUTANEOUS | Status: DC

## 2023-01-31 MED ORDER — FENTANYL CITRATE (PF) 250 MCG/5ML IJ SOLN
INTRAMUSCULAR | Status: DC | PRN
  Administered 2023-01-31 (×2): 50 ug via INTRAVENOUS

## 2023-01-31 MED ORDER — FENTANYL CITRATE (PF) 100 MCG/2ML IJ SOLN
25.0000 ug | INTRAMUSCULAR | Status: DC | PRN
  Administered 2023-01-31: 50 ug via INTRAVENOUS

## 2023-01-31 MED ORDER — VANCOMYCIN HCL IN DEXTROSE 1-5 GM/200ML-% IV SOLN
1000.0000 mg | Freq: Two times a day (BID) | INTRAVENOUS | Status: DC
  Administered 2023-02-01: 1000 mg via INTRAVENOUS
  Filled 2023-01-31 (×2): qty 200

## 2023-01-31 MED ORDER — IBUPROFEN 800 MG PO TABS
800.0000 mg | ORAL_TABLET | Freq: Once | ORAL | Status: AC
  Administered 2023-01-31: 800 mg via ORAL
  Filled 2023-01-31: qty 1

## 2023-01-31 MED ORDER — DEXAMETHASONE SODIUM PHOSPHATE 10 MG/ML IJ SOLN
INTRAMUSCULAR | Status: DC | PRN
  Administered 2023-01-31: 5 mg via INTRAVENOUS

## 2023-01-31 MED ORDER — SODIUM CHLORIDE 0.9% FLUSH
3.0000 mL | Freq: Two times a day (BID) | INTRAVENOUS | Status: DC
  Administered 2023-02-01 – 2023-02-02 (×3): 3 mL via INTRAVENOUS

## 2023-01-31 MED ORDER — ONDANSETRON HCL 4 MG/2ML IJ SOLN
INTRAMUSCULAR | Status: DC | PRN
  Administered 2023-01-31: 4 mg via INTRAVENOUS

## 2023-01-31 MED ORDER — POTASSIUM CHLORIDE CRYS ER 20 MEQ PO TBCR
40.0000 meq | EXTENDED_RELEASE_TABLET | ORAL | Status: AC
  Administered 2023-01-31: 40 meq via ORAL
  Filled 2023-01-31: qty 2

## 2023-01-31 MED ORDER — CHLORHEXIDINE GLUCONATE 0.12 % MT SOLN
OROMUCOSAL | Status: AC
  Administered 2023-01-31: 15 mL via OROMUCOSAL
  Filled 2023-01-31: qty 15

## 2023-01-31 MED ORDER — VANCOMYCIN HCL 1000 MG IV SOLR
INTRAVENOUS | Status: DC | PRN
  Administered 2023-01-31: 1000 mg via INTRAVENOUS

## 2023-01-31 MED ORDER — FENTANYL CITRATE (PF) 100 MCG/2ML IJ SOLN
INTRAMUSCULAR | Status: AC
  Filled 2023-01-31: qty 2

## 2023-01-31 MED ORDER — FENTANYL CITRATE (PF) 250 MCG/5ML IJ SOLN
INTRAMUSCULAR | Status: AC
  Filled 2023-01-31: qty 5

## 2023-01-31 MED ORDER — PHENYLEPHRINE 80 MCG/ML (10ML) SYRINGE FOR IV PUSH (FOR BLOOD PRESSURE SUPPORT)
PREFILLED_SYRINGE | INTRAVENOUS | Status: DC | PRN
  Administered 2023-01-31 (×4): 160 ug via INTRAVENOUS

## 2023-01-31 MED ORDER — CHLORHEXIDINE GLUCONATE 0.12 % MT SOLN: 15.0000 mL | Freq: Once | OROMUCOSAL | Status: AC

## 2023-01-31 MED ORDER — BUPIVACAINE HCL (PF) 0.25 % IJ SOLN
INTRAMUSCULAR | Status: AC
  Filled 2023-01-31: qty 30

## 2023-01-31 MED ORDER — MORPHINE SULFATE (PF) 2 MG/ML IV SOLN
2.0000 mg | INTRAVENOUS | Status: AC | PRN
  Administered 2023-02-01 (×4): 2 mg via INTRAVENOUS
  Filled 2023-01-31 (×4): qty 1

## 2023-01-31 MED ORDER — LAMOTRIGINE 100 MG PO TABS
100.0000 mg | ORAL_TABLET | Freq: Every day | ORAL | Status: DC
  Administered 2023-02-01 – 2023-02-02 (×2): 100 mg via ORAL
  Filled 2023-01-31 (×2): qty 1

## 2023-01-31 MED ORDER — ORAL CARE MOUTH RINSE: 15.0000 mL | Freq: Once | OROMUCOSAL | Status: AC

## 2023-01-31 MED ORDER — PROPOFOL 10 MG/ML IV BOLUS
INTRAVENOUS | Status: DC | PRN
  Administered 2023-01-31: 150 mg via INTRAVENOUS

## 2023-01-31 MED ORDER — ALBUTEROL SULFATE (2.5 MG/3ML) 0.083% IN NEBU: 2.5000 mg | INHALATION_SOLUTION | Freq: Four times a day (QID) | RESPIRATORY_TRACT | Status: DC | PRN

## 2023-01-31 MED ORDER — PROMETHAZINE HCL 25 MG/ML IJ SOLN
INTRAMUSCULAR | Status: AC
  Filled 2023-01-31: qty 1

## 2023-01-31 MED ORDER — MIDAZOLAM HCL 2 MG/2ML IJ SOLN
INTRAMUSCULAR | Status: DC | PRN
  Administered 2023-01-31: 2 mg via INTRAVENOUS

## 2023-01-31 MED ORDER — LACTATED RINGERS IV SOLN: INTRAVENOUS | Status: DC

## 2023-01-31 MED ORDER — ACETAMINOPHEN 325 MG PO TABS
650.0000 mg | ORAL_TABLET | Freq: Four times a day (QID) | ORAL | Status: DC | PRN
  Administered 2023-02-01: 650 mg via ORAL
  Filled 2023-01-31: qty 2

## 2023-01-31 MED ORDER — BUPIVACAINE HCL (PF) 0.25 % IJ SOLN
INTRAMUSCULAR | Status: DC | PRN
  Administered 2023-01-31: 9 mL

## 2023-01-31 MED ORDER — LORAZEPAM 2 MG/ML IJ SOLN
0.5000 mg | INTRAMUSCULAR | Status: DC | PRN
  Filled 2023-01-31 (×3): qty 1

## 2023-01-31 MED ORDER — PROMETHAZINE HCL 25 MG/ML IJ SOLN
6.2500 mg | INTRAMUSCULAR | Status: DC | PRN
  Administered 2023-01-31: 6.25 mg via INTRAVENOUS

## 2023-01-31 MED ORDER — ENOXAPARIN SODIUM 40 MG/0.4ML IJ SOSY
40.0000 mg | PREFILLED_SYRINGE | INTRAMUSCULAR | Status: DC
  Administered 2023-02-01: 40 mg via SUBCUTANEOUS
  Filled 2023-01-31 (×2): qty 0.4

## 2023-01-31 MED ORDER — POVIDONE-IODINE 10 % EX SWAB: 2.0000 | Freq: Once | CUTANEOUS | Status: DC

## 2023-01-31 MED ORDER — HYDROCODONE-ACETAMINOPHEN 5-325 MG PO TABS
1.0000 | ORAL_TABLET | Freq: Four times a day (QID) | ORAL | Status: DC | PRN
  Administered 2023-02-01: 1 via ORAL
  Filled 2023-01-31: qty 1

## 2023-01-31 MED ORDER — ACETAMINOPHEN 325 MG PO TABS
650.0000 mg | ORAL_TABLET | Freq: Once | ORAL | Status: AC
  Administered 2023-01-31: 650 mg via ORAL
  Filled 2023-01-31: qty 2

## 2023-01-31 MED ORDER — LIDOCAINE 2% (20 MG/ML) 5 ML SYRINGE
INTRAMUSCULAR | Status: DC | PRN
  Administered 2023-01-31: 100 mg via INTRAVENOUS

## 2023-01-31 MED ORDER — MORPHINE SULFATE (PF) 2 MG/ML IV SOLN
2.0000 mg | INTRAVENOUS | Status: DC | PRN
  Administered 2023-01-31: 2 mg via INTRAVENOUS
  Filled 2023-01-31: qty 1

## 2023-01-31 MED ORDER — SODIUM CHLORIDE 0.9 % IV SOLN
2.0000 g | INTRAVENOUS | Status: DC
  Administered 2023-01-31 – 2023-02-01 (×2): 2 g via INTRAVENOUS
  Filled 2023-01-31 (×3): qty 20

## 2023-01-31 SURGICAL SUPPLY — 57 items
ADAPTER CATH SYR TO TUBING 38M (ADAPTER) ×1 IMPLANT
BAG COUNTER SPONGE SURGICOUNT (BAG) ×1 IMPLANT
BNDG COHESIVE 2X5 TAN ST LF (GAUZE/BANDAGES/DRESSINGS) IMPLANT
BNDG ELASTIC 3INX 5YD STR LF (GAUZE/BANDAGES/DRESSINGS) ×1 IMPLANT
BNDG ELASTIC 4X5.8 VLCR STR LF (GAUZE/BANDAGES/DRESSINGS) ×1 IMPLANT
BNDG ESMARK 4X9 LF (GAUZE/BANDAGES/DRESSINGS) IMPLANT
BNDG GAUZE DERMACEA FLUFF 4 (GAUZE/BANDAGES/DRESSINGS) ×1 IMPLANT
CANNULA VESSEL 3MM 2 BLNT TIP (CANNULA) IMPLANT
CORD BIPOLAR FORCEPS 12FT (ELECTRODE) ×1 IMPLANT
COVER SURGICAL LIGHT HANDLE (MISCELLANEOUS) ×1 IMPLANT
CUFF TOURN SGL QUICK 18X4 (TOURNIQUET CUFF) IMPLANT
CUFF TOURN SGL QUICK 24 (TOURNIQUET CUFF)
CUFF TRNQT CYL 24X4X16.5-23 (TOURNIQUET CUFF) IMPLANT
DRAIN PENROSE 12X.25 LTX STRL (MISCELLANEOUS) IMPLANT
GAUZE PAD ABD 8X10 STRL (GAUZE/BANDAGES/DRESSINGS) ×2 IMPLANT
GAUZE SPONGE 4X4 12PLY STRL (GAUZE/BANDAGES/DRESSINGS) ×1 IMPLANT
GAUZE XEROFORM 1X8 LF (GAUZE/BANDAGES/DRESSINGS) ×1 IMPLANT
GLOVE BIO SURGEON STRL SZ7.5 (GLOVE) ×1 IMPLANT
GLOVE BIOGEL PI IND STRL 8 (GLOVE) ×1 IMPLANT
GLOVE BIOGEL PI IND STRL 8.5 (GLOVE) IMPLANT
GLOVE PI ORTHO PRO STRL SZ8 (GLOVE) IMPLANT
GLOVE SURG ORTHO 8.0 STRL STRW (GLOVE) IMPLANT
GOWN STRL REUS W/ TWL LRG LVL3 (GOWN DISPOSABLE) ×1 IMPLANT
GOWN STRL REUS W/ TWL XL LVL3 (GOWN DISPOSABLE) ×1 IMPLANT
GOWN STRL REUS W/TWL LRG LVL3 (GOWN DISPOSABLE) ×1
GOWN STRL REUS W/TWL XL LVL3 (GOWN DISPOSABLE) ×1
KIT BASIN OR (CUSTOM PROCEDURE TRAY) ×1 IMPLANT
KIT TURNOVER KIT B (KITS) ×1 IMPLANT
LOOP VASCLR MAXI BLUE 18IN ST (MISCELLANEOUS) IMPLANT
LOOP VASCULAR MAXI 18 BLUE (MISCELLANEOUS)
LOOPS VASCLR MAXI BLUE 18IN ST (MISCELLANEOUS) IMPLANT
MANIFOLD NEPTUNE II (INSTRUMENTS) IMPLANT
NDL HYPO 25X1 1.5 SAFETY (NEEDLE) IMPLANT
NEEDLE HYPO 25X1 1.5 SAFETY (NEEDLE) IMPLANT
NS IRRIG 1000ML POUR BTL (IV SOLUTION) ×1 IMPLANT
PACK ORTHO EXTREMITY (CUSTOM PROCEDURE TRAY) ×1 IMPLANT
PAD ARMBOARD 7.5X6 YLW CONV (MISCELLANEOUS) ×2 IMPLANT
PAD CAST CTTN 4X4 STRL (SOFTGOODS) IMPLANT
PADDING CAST COTTON 4X4 STRL (SOFTGOODS) ×1
SET CYSTO W/LG BORE CLAMP LF (SET/KITS/TRAYS/PACK) IMPLANT
SOL PREP POV-IOD 4OZ 10% (MISCELLANEOUS) ×2 IMPLANT
SPIKE FLUID TRANSFER (MISCELLANEOUS) ×1 IMPLANT
SPLINT PLASTER CAST FAST 3X15 (CAST SUPPLIES) IMPLANT
SPONGE T-LAP 4X18 ~~LOC~~+RFID (SPONGE) ×1 IMPLANT
SUT ETHILON 4 0 P 3 18 (SUTURE) IMPLANT
SUT ETHILON 4 0 PS 2 18 (SUTURE) IMPLANT
SUT MON AB 5-0 P3 18 (SUTURE) IMPLANT
SWAB COLLECTION DEVICE MRSA (MISCELLANEOUS) IMPLANT
SWAB CULTURE ESWAB REG 1ML (MISCELLANEOUS) IMPLANT
SYR 20ML LL LF (SYRINGE) ×1 IMPLANT
SYR CONTROL 10ML LL (SYRINGE) IMPLANT
TOWEL GREEN STERILE (TOWEL DISPOSABLE) ×1 IMPLANT
TUBE CONNECTING 12X1/4 (SUCTIONS) ×1 IMPLANT
TUBE NG 5FR 35IN ENFIT (TUBING) IMPLANT
UNDERPAD 30X36 HEAVY ABSORB (UNDERPADS AND DIAPERS) ×1 IMPLANT
VASCULAR TIE MAXI BLUE 18IN ST (MISCELLANEOUS)
YANKAUER SUCT BULB TIP NO VENT (SUCTIONS) ×1 IMPLANT

## 2023-01-31 NOTE — Op Note (Signed)
NAME: Randall Parsons MEDICAL RECORD NO: [id number] DATE OF BIRTH: [birth year] FACILITY: [facility] LOCATION: [facility] OR PHYSICIAN: Tami Ribas, MD   OPERATIVE REPORT   DATE OF PROCEDURE: 01/31/23    PREOPERATIVE DIAGNOSIS: Right index finger abscess   POSTOPERATIVE DIAGNOSIS: Right index finger abscess   PROCEDURE: Incision and drainage right index finger abscess   SURGEON:  Betha Loa, M.D.   ASSISTANT: none   ANESTHESIA:  General   INTRAVENOUS FLUIDS:  Per anesthesia flow sheet.   ESTIMATED BLOOD LOSS:  Minimal.   COMPLICATIONS:  None.   SPECIMENS: Cultures to micro   TOURNIQUET TIME:    Total Tourniquet Time Documented: Upper Arm (Right) - 16 minutes Total: Upper Arm (Right) - 16 minutes    DISPOSITION:  Stable to PACU.   INDICATIONS: 42 year old male states he has had increasing swelling pain and redness of the right index finger over the past few days.  He was seen in the emergency department yesterday and states that the area was incised but has reaccumulated and is increasingly painful.  No fevers or chills.  I recommend incision and drainage in the operating room.  Risks, benefits and alternatives of surgery were discussed including the risks of blood loss, infection, damage to nerves, vessels, tendons, ligaments, bone for surgery, need for additional surgery, complications with wound healing, continued pain, stiffness, , need for repeat irrigation and debridement.  He voiced understanding of these risks and elected to proceed.  OPERATIVE COURSE:  After being identified preoperatively by myself,  the patient and I agreed on the procedure and site of the procedure.  The surgical site was marked.  Surgical consent had been signed. Antibiotics were held for intraoperative cultures. He was transferred to the operating room and placed on the operating table in supine position with the Right upper extremity on an arm board.  General anesthesia was induced by the  anesthesiologist.  Right upper extremity was prepped and draped in normal sterile orthopedic fashion.  A surgical pause was performed between the surgeons, anesthesia, and operating room staff and all were in agreement as to the patient, procedure, and site of procedure.  Tourniquet at the proximal aspect of the extremity was inflated to 250 mmHg after exsanguination of the arm with an Esmarch bandage.  Incision was made over the fluctuant area on the dorsal radial aspect of the middle phalanx.  There was thin watery fluid underneath the skin.  This was not into the subcutaneous tissues.  Cultures were taken for aerobes and anaerobes.  The skin was raw and thinned.  Incision was made at the dorsum.  No purulence was encountered in the subcutaneous tissues.  There was an additional purulent spot over the proximal phalanx.  Again an incision was made in this area.  Gross purulence was encountered.  Cultures were taken of this as well.  The skin was incised.  There is no gross purulence in the subcutaneous tissues.  The wounds were copiously irrigated with sterile saline.  The scissors were used to debride devitalized skin over the middle phalanx to prevent reaccumulation of fluid.  The devitalized skin was removed.  The wounds were packed with quarter inch iodoform gauze.  Digital block was performed with quarter percent plain Marcaine to aid in postoperative analgesia.  Xeroform was placed over the raw areas of skin on the middle phalanx.  The wounds were dressed with sterile 4 x 4's and wrapped with a Kerlix bandage.  Volar splint is placed and wrapped with  Kerlix and Ace bandage.  The tourniquet was deflated at 16 minutes.  Fingertips were pink with brisk capillary refill after deflation of tourniquet.  The operative  drapes were broken down.  The patient was awoken from anesthesia safely.  He was transferred back to the stretcher and taken to PACU in stable condition.  He is admitted to the hospitalist for IV  antibiotics.   Betha Loa, MD Electronically signed, 01/31/23

## 2023-01-31 NOTE — Anesthesia Postprocedure Evaluation (Signed)
Anesthesia Post Note  Patient: Leverne Amrhein  Procedure(s) Performed: IRRIGATION AND DEBRIDEMENT INDEX FINGER (Right)     Patient location during evaluation: PACU Anesthesia Type: General Level of consciousness: awake and alert Pain management: pain level controlled Vital Signs Assessment: post-procedure vital signs reviewed and stable Respiratory status: spontaneous breathing, nonlabored ventilation, respiratory function stable and patient connected to nasal cannula oxygen Cardiovascular status: blood pressure returned to baseline and stable Postop Assessment: no apparent nausea or vomiting Anesthetic complications: no   No notable events documented.  Last Vitals:  Vitals:   01/31/23 2130 01/31/23 2145  BP: 102/68 (!) 109/57  Pulse: 64 67  Resp: (!) 22 (!) 24  Temp:    SpO2: 97% 99%    Last Pain:  Vitals:   01/31/23 2145  TempSrc:   PainSc: 0-No pain                 Collene Schlichter

## 2023-01-31 NOTE — ED Notes (Signed)
ED TO INPATIENT HANDOFF REPORT  ED Nurse Name and Phone #:   S Name/Age/Gender Randall Parsons 42 y.o. male Room/Bed: H015C/H015C  Code Status   Code Status: Prior  Home/SNF/Other Home Patient oriented to: self, place, time, and situation Is this baseline? Yes   Triage Complete: Triage complete  Chief Complaint Abscess of finger [L02.519]  Triage Note Pt here via POV d/t a Rt index finger infection he started started 5 days ago & has been seen recently for it. States the pain is no better & its unbearable.    Allergies Allergies  Allergen Reactions   Amoxicillin Hives and Other (See Comments)    Did it involve swelling of the face/tongue/throat, SOB, or low BP? No Did it involve sudden or severe rash/hives, skin peeling, or any reaction on the inside of your mouth or nose? Yes Did you need to seek medical attention at a hospital or doctor's office? Yes When did it last happen?     42 yrs old If all above answers are "NO", may proceed with cephalosporin use.    Fish Allergy Hives   Prednisone Other (See Comments)    irritable    Level of Care/Admitting Diagnosis ED Disposition     ED Disposition  Admit   Condition  --   Comment  Hospital [facility] [[id number]]  Level of Care: Med-Surg [16]  May place patient in observation at [facility] or [facility] if equivalent level of care is available:: No  Covid Evaluation: Asymptomatic - no recent exposure (last 10 days) testing not required  Diagnosis: Abscess of finger [164035]  Admitting Physician: Clydie Braun [[id number]]  Attending Physician: Clydie Braun [[id number]]          B Medical/Surgery History Past Medical History:  Diagnosis Date   CHF (congestive heart failure) (HCC)    Exposure to hepatitis B    Exposure to hepatitis C    Hepatitis C    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, ending around 2017   Hypertension    IV drug abuse (HCC)    Renal  disorder    kidney stones   Past Surgical History:  Procedure Laterality Date   APPENDECTOMY     EYE SURGERY     secondary to dog bite   TIBIA FRACTURE SURGERY Right      A IV Location/Drains/Wounds Patient Lines/Drains/Airways Status     Active Line/Drains/Airways     Name Placement date Placement time Site Days   Peripheral IV 01/31/23 18 G Anterior;Distal;Left;Upper Arm 01/31/23  1040  Arm  less than 1            Intake/Output Last 24 hours No intake or output data in the 24 hours ending 01/31/23 1206  Labs/Imaging Results for orders placed or performed during the hospital encounter of 01/31/23 (from the past 48 hour(s))  CBC with Differential     Status: Abnormal   Collection Time: 01/31/23  9:32 AM  Result Value Ref Range   WBC 5.8 4.0 - 10.5 K/uL   RBC 3.90 (L) 4.22 - 5.81 MIL/uL   Hemoglobin 11.4 (L) 13.0 - 17.0 g/dL   HCT 19.1 (L) 47.8 - 29.5 %   MCV 91.3 80.0 - 100.0 fL   MCH 29.2 26.0 - 34.0 pg   MCHC 32.0 30.0 - 36.0 g/dL   RDW 62.1 30.8 - 65.7 %   Platelets 348 150 - 400 K/uL   nRBC  0.0 0.0 - 0.2 %   Neutrophils Relative % 58 %   Neutro Abs 3.3 1.7 - 7.7 K/uL   Lymphocytes Relative 28 %   Lymphs Abs 1.6 0.7 - 4.0 K/uL   Monocytes Relative 12 %   Monocytes Absolute 0.7 0.1 - 1.0 K/uL   Eosinophils Relative 1 %   Eosinophils Absolute 0.1 0.0 - 0.5 K/uL   Basophils Relative 1 %   Basophils Absolute 0.0 0.0 - 0.1 K/uL   Immature Granulocytes 0 %   Abs Immature Granulocytes 0.02 0.00 - 0.07 K/uL    Comment: Performed at [facility], [address] [place], [place] [address]  Basic metabolic panel     Status: Abnormal   Collection Time: 01/31/23  9:32 AM  Result Value Ref Range   Sodium 136 135 - 145 mmol/L   Potassium 3.1 (L) 3.5 - 5.1 mmol/L   Chloride 103 98 - 111 mmol/L   CO2 23 22 - 32 mmol/L   Glucose, Bld 110 (H) 70 - 99 mg/dL    Comment: Glucose reference range applies only to samples taken after fasting for at least 8 hours.    BUN 8 6 - 20 mg/dL   Creatinine, Ser 6.04 0.61 - 1.24 mg/dL   Calcium 8.4 (L) 8.9 - 10.3 mg/dL   GFR, Estimated >54 >09 mL/min    Comment: (NOTE) Calculated using the CKD-EPI Creatinine Equation (2021)    Anion gap 10 5 - 15    Comment: Performed at [facility], [address], [place], [place] [address]  Ethanol     Status: None   Collection Time: 01/31/23  9:32 AM  Result Value Ref Range   Alcohol, Ethyl (B) <10 <10 mg/dL    Comment: (NOTE) Lowest detectable limit for serum alcohol is 10 mg/dL.  For medical purposes only. Performed at [facility], [address] [place], [place] [address]    No results found.  Pending Labs Unresulted Labs (From admission, onward)     Start     Ordered   01/31/23 0909  Rapid urine drug screen (hospital performed)  ONCE - STAT,   STAT        01/31/23 0908            Vitals/Pain Today's Vitals   01/31/23 0711 01/31/23 0713 01/31/23 1135  BP: (!) 126/95  108/85  Pulse: 81  78  Resp: 20  19  Temp: 98.4 F (36.9 C)  98.5 F (36.9 C)  TempSrc: Oral  Oral  SpO2: 96%  100%  PainSc:  10-Worst pain ever     Isolation Precautions No active isolations  Medications Medications  vancomycin (VANCOCIN) IVPB 1000 mg/200 mL premix (has no administration in time range)  vancomycin (VANCOREADY) IVPB 1500 mg/300 mL (1,500 mg Intravenous New Bag/Given 01/31/23 1141)  cefTRIAXone (ROCEPHIN) 2 g in sodium chloride 0.9 % 100 mL IVPB (0 g Intravenous Stopped 01/31/23 1130)  metroNIDAZOLE (FLAGYL) IVPB 500 mg (0 mg Intravenous Stopped 01/31/23 1205)  vancomycin (VANCOCIN) IVPB 1000 mg/200 mL premix (has no administration in time range)  doxycycline (VIBRA-TABS) tablet 100 mg (100 mg Oral Given 01/31/23 0739)  ibuprofen (ADVIL) tablet 800 mg (800 mg Oral Given 01/31/23 0739)  acetaminophen (TYLENOL) tablet 650 mg (650 mg Oral Given 01/31/23 5621)    Mobility walks     Focused Assessments    R Recommendations: See Admitting  Provider Note  Report given to:   Additional Notes:

## 2023-01-31 NOTE — Consult Note (Addendum)
Reason for Consult:Right index finger infection Referring Physician: Alvira Monday Time called: 0740 Time at bedside: [phone]   Randall Parsons is an 42 y.o. male.  HPI: Randall Parsons comes in with a 3-4d hx/o right index finger infection. He was seen in the ED yesterday and had an I&D performed. He was sent out on doxy but was unable to get his rx. He returns with increased pain and swelling. He denies any antecedent event. He is an active IVDU.   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 He has never used smokeless tobacco. He reports that he does not currently use drugs after having used the following drugs: IV and Cocaine. He reports that he does not drink alcohol.  Allergies:  Allergies  Allergen Reactions   Amoxicillin Hives and Other (See Comments)    Did it involve swelling of the face/tongue/throat, SOB, or low BP? No Did it involve sudden or severe rash/hives, skin peeling, or any reaction on the inside of your mouth or nose? Yes Did you need to seek medical attention at a hospital or doctor's office? Yes When did it last happen?     42 yrs old If all above answers are "NO", may proceed with cephalosporin use.    Fish Allergy Hives   Prednisone Other (See Comments)    irritable    Medications: I have reviewed the patient's current medications.  No  results found for this or any previous visit (from the past 48 hour(s)).  No results found.  Review of Systems  Constitutional:  Negative for chills, diaphoresis and fever.  HENT:  Negative for ear discharge, ear pain, hearing loss and tinnitus.   Eyes:  Negative for photophobia and pain.  Respiratory:  Negative for cough and shortness of breath.   Cardiovascular:  Negative for chest pain.  Gastrointestinal:  Negative for abdominal pain, nausea and vomiting.  Genitourinary:  Negative for dysuria, flank pain, frequency and urgency.  Musculoskeletal:  Positive for arthralgias (Right index finger). Negative for back pain, myalgias and neck pain.  Neurological:  Negative for dizziness and headaches.  Hematological:  Does not bruise/bleed easily.  Psychiatric/Behavioral:  The patient is not nervous/anxious.    Blood pressure (!) 126/95, pulse 81, temperature 98.4 F (36.9 C), temperature source Oral, resp. rate 20, SpO2 96 %. Physical Exam Constitutional:      General: He is not in acute distress.    Appearance: He is well-developed. He is not diaphoretic.  HENT:     Head: Normocephalic and atraumatic.  Eyes:     General: No scleral icterus.       Right eye: No  discharge.        Left eye: No discharge.     Conjunctiva/sclera: Conjunctivae normal.  Cardiovascular:     Rate and Rhythm: Normal rate and regular rhythm.  Pulmonary:     Effort: Pulmonary effort is normal. No respiratory distress.  Musculoskeletal:     Cervical back: Normal range of motion.     Comments: Right shoulder, elbow, wrist, digits- no skin wounds, index finger superficial distal abscess, severe TTP, able to move PIP, MCP without pain, no flexor TTP, no instability, no blocks to motion  Sens  Ax/R/M/U intact  Mot   Ax/ R/ PIN/ M/ AIN/ U intact  Rad 2+  Skin:    General: Skin is warm and dry.  Neurological:     Mental Status: He is alert.  Psychiatric:        Mood and Affect: Mood normal.        Behavior:  Behavior normal.     Assessment/Plan: Right index finger infection -- Given pt's discomfort and reliability issues I think the prudent thing to do is admit for IV abx and plan I&D with Dr. Merlyn Lot this afternoon. Please keep NPO.    Freeman Caldron, PA-C Orthopedic Surgery [phone] 01/31/2023, 9:22 AM   Addendum (01/31/23): Seen and examined.  Agree with above. H: state he has had increasing pain and swelling right index finger over past couple of days.  Had I&D in ED last night, but worsening today.  No fevers or chills.  Finger is painful. E: intact sensation and capillary refill all digits.  Right index with fluctuance under superficial skin dorsoradial middle phalanx and distal phalanx.  Second small purulent area on proximal phalanx.  No proximal streaking.  Very tender to palpation over fluctuant area.  Non tender over flexor tendon.  Non tender at pip joint and can move mp and pip joints without pain. A/P: right index finger abscess. Recommend incision and drainage in OR.  Risks, benefits and alternatives of surgery were discussed including risks of blood loss, infection, damage to nerves/vessels/tendons/ligament/bone, failure of surgery, need for additional surgery, complication with wound healing, stiffness, need for repeat irrigation and debridement.  He voiced understanding of these risks and elected to proceed.

## 2023-01-31 NOTE — Discharge Instructions (Signed)
[facility] Instructions Hand Surgery  Wound Care: Keep your hand elevated above the level of your heart.  Do not allow it to dangle by your side.  Keep the dressing dry and do not remove it unless your doctor advises you to do so.  He will usually change it at the time of your post-op visit.  Moving your fingers is advised to stimulate circulation but will depend on the site of your surgery.  If you have a splint applied, your doctor will advise you regarding movement.  Activity: Do not drive or operate machinery today.  Rest today and then you may return to your normal activity and work as indicated by your physician.  Diet:  Drink liquids today or eat a light diet.  You may resume a regular diet tomorrow.    General expectations: Pain for two to three days. Fingers may become slightly swollen.  Call your doctor if any of the following occur: Severe pain not relieved by pain medication. Elevated temperature. Dressing soaked with blood. Inability to move fingers. White or bluish color to fingers.      Therapeutic Alternatives: Mobile Crisis Management:  [phone]     [facility]         [address]  [place], [place] [address]  [phone]     [facility] [employer] sliding scale fee and walk in schedule: M-F 8am-12pm/1pm-3pm  [address]  [place], [place] [address]  [phone]     [facility]/ Intensive Outpatient Therapy Program  [address]  [place], [place] [address]  [phone]     [facility]                                  [facility]  [address]                             [address]  [place], [place] [address]                                              [place], [place] [address]  [phone]                                                   [phone]      [facility]                 [facility]  [address]                    [address] [place] [address]                                               [place] [address]  [phone]  [phone]     Andee Poles, MD                                     [facility]  [address]                                            [address]  [place] [address]                                          [place] [address]  [phone]                                                             [phone]     [facility]                         [facility]  [address]                             [address] [place], [place]  [phone]                                                   [phone]     [facility]                                    [facility]  [phone] [address], [place]  [place], [place]                                        [address]  [phone]                                                  [place], [place] [address]  [phone]  [facility]: (Spanish speaking)  [phone]     [facility]  [address]                         [address]  [place], [place] [address]                        [place], [place] [address]  [phone]                                              [phone]

## 2023-01-31 NOTE — H&P (Signed)
History and Physical    Patient: Randall Parsons UEA:[id number] DOB: 11/24/1980 DOA: 01/31/2023 DOS: the patient was seen and examined on 01/31/2023 PCP: [facility]  Patient coming from: Home  Chief Complaint:  Chief Complaint  Patient presents with   Finger Infection   HPI: Randall Parsons is a 42 y.o. male with medical history significant of hypertension,IVDA, substance induced mood disorder, and polysubstance abuse who presents with complaints of a infection of his right index finger.  Patient states he is unsure of how the injury to his right index finger had initially occurred, but states that he noticed it initially 4 to 5 days ago.  Pain in the finger got acutely worsened yesterday and he had been seen in the emergency department for evaluation.  The ED provider had performed I&D and patient was had been prescribed doxycycline, but was unable to pick it up.  Patient reports inability to flex his right finger due to severe pain.  He also reports that he has been more anxious.  He does admit to recently using cocaine.  His wife is present at bedside and provides additional history and notes that the patient had not been himself.  He has been paranoid with delusions stating that undercover police are chasing him asking random people on the street.  Patient admits that this is true and states symptoms were better when he had been on Lamictal, but had not taken it in 3 weeks.  In the emergency department patient was found to be afebrile with stable vital signs.  Labs noted WBC 5.8, hemoglobin 11.4, potassium 3.1, and calcium 8.4,.  Patient has been given initial antibiotics of doxycycline, ibuprofen, and Tylenol.  Dr. Porfirio Mylar of hand surgery had been consulted with plans for taken for I&D.  He has been transition to IV antibiotics of vancomycin, doxycycline, and Rocephin. Review of Systems: As mentioned in the history of present illness. All other systems reviewed and are  negative. Past Medical History:  Diagnosis Date   CHF (congestive heart failure) (HCC)    Exposure to hepatitis B    Exposure to hepatitis C    Hepatitis C    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, ending around 2017   Hypertension    IV drug abuse (HCC)    Renal disorder    kidney stones   Past Surgical History:  Procedure Laterality Date   APPENDECTOMY     EYE SURGERY     secondary to dog bite   TIBIA FRACTURE SURGERY Right    Social History:  reports that he has quit smoking. He has never used smokeless tobacco. He reports that he does not currently use drugs after having used the following drugs: IV and Cocaine. He reports that he does not drink alcohol.  Allergies  Allergen Reactions   Amoxicillin Hives and Other (See Comments)    Did it involve swelling of the face/tongue/throat, SOB, or low BP? No Did it involve sudden or severe rash/hives, skin peeling, or any reaction on the inside of your mouth or nose? Yes Did you need to seek medical attention at a hospital or doctor's office? Yes When did it last happen?     42 yrs old If all above answers are "NO", may proceed with cephalosporin use.    Fish Allergy Hives   Prednisone Other (See Comments)    irritable    Family History  Problem Relation Age of Onset   Alcohol abuse  Father    Melanoma Mother    Breast cancer Mother    Colon cancer Neg Hx    Prostate cancer Neg Hx     Prior to Admission medications   Medication Sig Start Date End Date Taking? Authorizing Provider  acetaminophen (TYLENOL) 500 MG tablet Take 1 tablet (500 mg total) by mouth every 8 (eight) hours as needed for moderate pain. Patient not taking: Reported on 01/31/2023 01/18/23   Leroy Sea, MD  albuterol (VENTOLIN HFA) 108 (90 Base) MCG/ACT inhaler Inhale 2 puffs into the lungs every 6 (six) hours as needed for wheezing or shortness of breath (shortness of breath or wheezing). Patient not taking: Reported on 01/31/2023  01/18/23   Leroy Sea, MD  alfuzosin (UROXATRAL) 10 MG 24 hr tablet Take 1 tablet by mouth daily. Patient not taking: Reported on 01/31/2023    [provider]  doxycycline (VIBRAMYCIN) 100 MG capsule Take 1 capsule (100 mg total) by mouth 2 (two) times daily. One po bid x 7 days Patient not taking: Reported on 01/31/2023 01/30/23   Melene Plan, DO  lamoTRIgine (LAMICTAL) 200 MG tablet Take 200 mg by mouth daily. Patient not taking: Reported on 01/31/2023    [provider]  levofloxacin (LEVAQUIN) 750 MG tablet Take 1 tablet (750 mg total) by mouth daily. Patient not taking: Reported on 01/31/2023 01/18/23   Leroy Sea, MD  pantoprazole (PROTONIX) 40 MG tablet Take 1 tablet (40 mg total) by mouth daily. 07/17/19 08/03/19  Clapacs, Jackquline Denmark, MD    Physical Exam: Vitals:   01/31/23 0711 01/31/23 1135  BP: (!) 126/95 108/85  Pulse: 81 78  Resp: 20 19  Temp: 98.4 F (36.9 C) 98.5 F (36.9 C)  TempSrc: Oral Oral  SpO2: 96% 100%    Constitutional: Middle-age male who appears to be in some distress Eyes: PERRL, lids and conjunctivae normal ENMT: Mucous membranes are moist. Posterior pharynx clear of any exudate or lesions.Normal dentition.  Neck: normal, supple, no masses, no thyromegaly Respiratory: clear to auscultation bilaterally, no wheezing, no crackles. Normal respiratory effort. No accessory muscle use.  Cardiovascular: Regular rate and rhythm, no murmurs / rubs / gallops. No extremity edema. 2+ pedal pulses. No carotid bruits.  Abdomen: no tenderness, no masses palpated. No hepatosplenomegaly. Bowel sounds positive.  Musculoskeletal: no clubbing / cyanosis.  Swelling noted of the right index finger with concern for abscess present to the distal aspect.  Some decrease in range of motion appears present secondary to pain skin: Significant swelling of the right index finger Neurologic: CN 2-12 grossly intact.  Strength 5/5 in all 4.  Psychiatric: Alert.   Patient does report cops are after him.  Seems very anxious.  Data Reviewed:  Reviewed labs, imaging, and pertinent records as noted above in the HPI Assessment and Plan: Infection of the right index finger with abscess Acute.  Patient presents with complaints of  infection of his right index finger.  Unclear the cause of injury to onset symptoms.  Patient had gone I&D and been prescribed doxycycline, but with unable to pick up medication and presented back to the hospital with worsening pain.  Orthopedics consulted and plan on I&D later this afternoon with Dr. Merlyn Lot.  Patient has been placed on empiric antibiotics of vancomycin and Rocephin -Admit to MedSurg bed -N.p.o. for need of I&D -Continue empiric antibiotics of vancomycin and Rocephin -Hydrocodone as needed for pain  Hypokalemia Acute.  Initial potassium 3.1. -Give 40 mEq of potassium chloride  p.o. -Continue to monitor and replace as needed  Paranoid delusions Anxiety Patient wife makes note that he is constantly thinking that the cops are coming to get him asking random people on the street.  He had previous been on Lamictal 200 mg.  Wife request that psychiatry see him in regards to these delusions.  Unclear if these delusions are associated with his cocaine abuse, but peers has been diagnosed with mood disorder related to substance previously in the past. -Resume Lamictal at 100mg  with plans to increase back to 200 mg daily -Recommend formal psychiatry evaluation once patient medically stable  History of hepatitis C Patient had previously been recommended to have follow-up arranged by PCP in the outpatient setting with GI and ID  Polysubstance abuse History of IV drug use Cocaine abuse Patient did admit to recently using cocaine. -Follow-up UDS -Ativan IV as needed in the setting o of possible acute intoxication  DVT prophylaxis: Lovenox Advance Care Planning:   Code Status: Full Code    Consults: Hand  surgery  Family Communication: Wife updated at bedside  Severity of Illness: The appropriate patient status for this patient is OBSERVATION. Observation status is judged to be reasonable and necessary in order to provide the required intensity of service to ensure the patient's safety. The patient's presenting symptoms, physical exam findings, and initial radiographic and laboratory data in the context of their medical condition is felt to place them at decreased risk for further clinical deterioration. Furthermore, it is anticipated that the patient will be medically stable for discharge from the hospital within 2 midnights of admission.   Author: Clydie Braun, MD 01/31/2023 11:46 AM  For on call review [email or web address].

## 2023-01-31 NOTE — ED Triage Notes (Signed)
Pt here via POV d/t a Rt index finger infection he started started 5 days ago & has been seen recently for it. States the pain is no better & its unbearable.

## 2023-01-31 NOTE — Anesthesia Preprocedure Evaluation (Signed)
Anesthesia Evaluation  Patient identified by MRN, date of birth, ID band Patient awake    Reviewed: Allergy & Precautions, NPO status , Patient's Chart, lab work & pertinent test results  Airway Mallampati: II  TM Distance: >3 FB Neck ROM: Full    Dental  (+) Teeth Intact, Dental Advisory Given   Pulmonary asthma , former smoker   Pulmonary exam normal breath sounds clear to auscultation       Cardiovascular hypertension, + Past MI and +CHF  Normal cardiovascular exam Rhythm:Regular Rate:Normal     Neuro/Psych  PSYCHIATRIC DISORDERS Anxiety Depression    CVA    GI/Hepatic negative GI ROS,,,(+)     substance abuse  , Hepatitis -, C  Endo/Other  negative endocrine ROS    Renal/GU Renal disease     Musculoskeletal  (+)  narcotic dependentRight index finger infection   Abdominal   Peds  Hematology  (+) Blood dyscrasia, anemia   Anesthesia Other Findings Day of surgery medications reviewed with the patient.  Reproductive/Obstetrics                              Anesthesia Physical Anesthesia Plan  ASA: 3 and emergent  Anesthesia Plan: General   Post-op Pain Management: Tylenol PO (pre-op)*   Induction: Intravenous and Rapid sequence  PONV Risk Score and Plan: 2 and Midazolam, Dexamethasone and Ondansetron  Airway Management Planned: Oral ETT  Additional Equipment:   Intra-op Plan:   Post-operative Plan: Extubation in OR  Informed Consent: I have reviewed the patients History and Physical, chart, labs and discussed the procedure including the risks, benefits and alternatives for the proposed anesthesia with the patient or authorized representative who has indicated his/her understanding and acceptance.     Dental advisory given  Plan Discussed with: CRNA  Anesthesia Plan Comments:          Anesthesia Quick Evaluation

## 2023-01-31 NOTE — Progress Notes (Signed)
Pharmacy Antibiotic Note  Randall Parsons is a 42 y.o. male admitted on 01/31/2023 with  wound infection .  Pharmacy has been consulted for Vancomycin dosing.  CrCl 70-80 mL/min  Plan: Vancomycin 1500mg  IV once then 1000mg  Q12H (eAUC 532, Scr 1.2, Vd 0.72) - On-call dose of 1000mg  for OR also ordered' CTX 2g Q24H Flagyl 500mg  IV Q12H     Temp (24hrs), Avg:98.4 F (36.9 C), Min:98.4 F (36.9 C), Max:98.4 F (36.9 C)  No results for input(s): "WBC", "CREATININE", "LATICACIDVEN", "VANCOTROUGH", "VANCOPEAK", "VANCORANDOM", "GENTTROUGH", "GENTPEAK", "GENTRANDOM", "TOBRATROUGH", "TOBRAPEAK", "TOBRARND", "AMIKACINPEAK", "AMIKACINTROU", "AMIKACIN" in the last 168 hours.  Estimated Creatinine Clearance: 78.6 mL/min (by C-G formula based on SCr of 1.2 mg/dL).    Allergies  Allergen Reactions   Amoxicillin Hives and Other (See Comments)    Did it involve swelling of the face/tongue/throat, SOB, or low BP? No Did it involve sudden or severe rash/hives, skin peeling, or any reaction on the inside of your mouth or nose? Yes Did you need to seek medical attention at a hospital or doctor's office? Yes When did it last happen?     42 yrs old If all above answers are "NO", may proceed with cephalosporin use.    Fish Allergy Hives   Prednisone Other (See Comments)    irritable    Thank you for allowing pharmacy to be a part of this patient's care.  Ellis Savage, PharmD Clinical Pharmacist 01/31/2023 9:36 AM

## 2023-01-31 NOTE — Progress Notes (Signed)
Postop note: Status post incision and drainage right index finger.  Thin watery fluid encountered over middle phalanx.  Small amount of purulence over proximal phalanx.  Cultures taken.  Wound packed.  Start hydrotherapy in 2 to 4 days.  Okay for DVT prophylaxis.  Okay for discharge home when afebrile, white blood count trending down, pain controlled.  Can start hydrotherapy in office.  Recommend Bactrim or doxycycline on discharge if indicated.

## 2023-01-31 NOTE — Anesthesia Procedure Notes (Addendum)
Procedure Name: Intubation Date/Time: 01/31/2023 8:29 PM  Performed by: Tressia Miners, CRNAPre-anesthesia Checklist: Patient identified, Emergency Drugs available, Suction available and Patient being monitored Patient Re-evaluated:Patient Re-evaluated prior to induction Oxygen Delivery Method: Circle System Utilized Preoxygenation: Pre-oxygenation with 100% oxygen Induction Type: IV induction Ventilation: Mask ventilation without difficulty Laryngoscope Size: Mac and 4 Grade View: Grade I Tube type: Oral Tube size: 7.5 mm Number of attempts: 1 Airway Equipment and Method: Stylet Placement Confirmation: ETT inserted through vocal cords under direct vision, positive ETCO2 and breath sounds checked- equal and bilateral Secured at: 23 cm Tube secured with: Tape Dental Injury: Teeth and Oropharynx as per pre-operative assessment  Comments: Smooth IV induction. Eyes taped. Easy mask. DL x 1 with grad 1 view. Atraumatically placed, teeth and lip remain intact as preop. Secured with tape, bilateral breath sounds +/=, EtCO2 +, Adequate TV, VSS

## 2023-01-31 NOTE — ED Provider Notes (Signed)
[place] EMERGENCY DEPARTMENT AT [facility] Provider Note   CSN: [id number] Arrival date & time: 01/31/23  0701     History  Chief Complaint  Patient presents with   Finger Infection    Randall Parsons is a 42 y.o. male.  Patient with history of polysubstance abuse, admission for overdose and pneumonia earlier in the month --presents to the emergency department today for continued right index finger pain.  Symptoms have been ongoing for about 4 days.  Patient presented early yesterday morning for possible finger infection.  Patient was given a digital block and had an I&D performed.  He states that his symptoms have persisted.  He states that the digital block dulled the pain for a little while, but is not sufficient to control his pain.  He denies fevers.  He has not filled the doxycycline prescribed at previous visit.       Home Medications Prior to Admission medications   Medication Sig Start Date End Date Taking? Authorizing Provider  acetaminophen (TYLENOL) 500 MG tablet Take 1 tablet (500 mg total) by mouth every 8 (eight) hours as needed for moderate pain. 01/18/23   Leroy Sea, MD  albuterol (VENTOLIN HFA) 108 (90 Base) MCG/ACT inhaler Inhale 2 puffs into the lungs every 6 (six) hours as needed for wheezing or shortness of breath (shortness of breath or wheezing). 01/18/23   Leroy Sea, MD  alfuzosin (UROXATRAL) 10 MG 24 hr tablet Take 1 tablet by mouth daily.    [provider]  doxycycline (VIBRAMYCIN) 100 MG capsule Take 1 capsule (100 mg total) by mouth 2 (two) times daily. One po bid x 7 days 01/30/23   Melene Plan, DO  lamoTRIgine (LAMICTAL) 200 MG tablet Take 200 mg by mouth daily.    [provider]  levofloxacin (LEVAQUIN) 750 MG tablet Take 1 tablet (750 mg total) by mouth daily. 01/18/23   Leroy Sea, MD  pantoprazole (PROTONIX) 40 MG tablet Take 1 tablet (40 mg total) by mouth daily. 07/17/19 08/03/19  Clapacs,  Jackquline Denmark, MD      Allergies    Amoxicillin, Fish allergy, and Prednisone    Review of Systems   Review of Systems  Physical Exam Updated Vital Signs BP (!) 126/95   Pulse 81   Temp 98.4 F (36.9 C) (Oral)   Resp 20   SpO2 96%  Physical Exam Vitals and nursing note reviewed.  Constitutional:      Appearance: He is well-developed.  HENT:     Head: Normocephalic and atraumatic.  Eyes:     Conjunctiva/sclera: Conjunctivae normal.  Cardiovascular:     Pulses: Normal pulses. No decreased pulses.  Musculoskeletal:        General: Tenderness present.     Cervical back: Normal range of motion and neck supple.     Right lower leg: No edema.     Left lower leg: No edema.     Comments: Right index finger: Diffuse swelling of the finger generally, but involves more of the dorsum of the finger.  There is erythema that extends down towards the MCP joint dorsally.  There is blistering and a subcutaneous fluid collection noted overlying the DIP joint.  Patient has significant pain with any motion of the digit, including passive extension cap refill less than 2 seconds.  Minimal tenderness to palpation over the flexor tendon sheath.  Skin:    General: Skin is warm and dry.  Neurological:  Mental Status: He is alert.     Sensory: No sensory deficit.     Comments: Motor, sensation, and vascular distal to the injury is fully intact.   Psychiatric:        Mood and Affect: Mood normal.        ED Results / Procedures / Treatments   Labs (all labs ordered are listed, but only abnormal results are displayed) Labs Reviewed  CBC WITH DIFFERENTIAL/PLATELET - Abnormal; Notable for the following components:      Result Value   RBC 3.90 (*)    Hemoglobin 11.4 (*)    HCT 35.6 (*)    All other components within normal limits  BASIC METABOLIC PANEL - Abnormal; Notable for the following components:   Potassium 3.1 (*)    Glucose, Bld 110 (*)    Calcium 8.4 (*)    All other components within  normal limits  ETHANOL  RAPID URINE DRUG SCREEN, HOSP PERFORMED    EKG None  Radiology No results found.  Procedures Procedures    Medications Ordered in ED Medications  vancomycin (VANCOCIN) IVPB 1000 mg/200 mL premix (has no administration in time range)  vancomycin (VANCOREADY) IVPB 1500 mg/300 mL (1,500 mg Intravenous New Bag/Given 01/31/23 1141)  cefTRIAXone (ROCEPHIN) 2 g in sodium chloride 0.9 % 100 mL IVPB (0 g Intravenous Stopped 01/31/23 1130)  metroNIDAZOLE (FLAGYL) IVPB 500 mg (500 mg Intravenous New Bag/Given 01/31/23 1051)  vancomycin (VANCOCIN) IVPB 1000 mg/200 mL premix (has no administration in time range)  doxycycline (VIBRA-TABS) tablet 100 mg (100 mg Oral Given 01/31/23 0739)  ibuprofen (ADVIL) tablet 800 mg (800 mg Oral Given 01/31/23 0739)  acetaminophen (TYLENOL) tablet 650 mg (650 mg Oral Given 01/31/23 1610)    ED Course/ Medical Decision Making/ A&P    Patient seen and examined. History obtained directly from patient.  I reviewed recent ED visits and hospitalization notes.  Labs/EKG: None ordered  Imaging: None ordered  Medications/Fluids: Ordered: Doxycycline.  Given previous history of opioid abuse and life-threatening overdose, will treat pain with ibuprofen and Tylenol.   Most recent vital signs reviewed and are as follows: BP (!) 126/95   Pulse 81   Temp 98.4 F (36.9 C) (Oral)   Resp 20   SpO2 96%   Initial impression: Right index finger infection  11:48 AM Reassessment performed. Patient has been stable.  I consulted orthopedics who has seen the patient.  They recommend patient be admitted for IV antibiotics and a plan debridement in the operating room later this afternoon.  Request NPO.  Labs personally reviewed and interpreted including: CBC normal white blood cell count, hemoglobin 11.4 otherwise unremarkable; BMP potassium is low at 3.1; alcohol negative; UDS pending.  Most current vital signs reviewed and are as follows: BP 108/85  (BP Location: Left Arm)   Pulse 78   Temp 98.5 F (36.9 C) (Oral)   Resp 19   SpO2 100%   Plan: Admit to hospital, OR I&D  I have consulted with Dr. Katrinka Blazing of [facility] hospitalist who will see patient for admission.                            Medical Decision Making Amount and/or Complexity of Data Reviewed Labs: ordered.  Risk OTC drugs. Prescription drug management. Decision regarding hospitalization.   Social determinants of health care affected by patient's ongoing drug use and noncompliance.        Final Clinical Impression(s) /  ED Diagnoses Final diagnoses:  Abscess of right index finger  Hypokalemia    Rx / DC Orders ED Discharge Orders     None         Renne Crigler, PA-C 01/31/23 1150    Rondel Baton, MD 02/01/23 1021

## 2023-01-31 NOTE — Transfer of Care (Signed)
Immediate Anesthesia Transfer of Care Note  Patient: Bertil Brickey  Procedure(s) Performed: IRRIGATION AND DEBRIDEMENT INDEX FINGER (Right)  Patient Location: PACU  Anesthesia Type:General  Level of Consciousness: awake and alert   Airway & Oxygen Therapy: Patient Spontanous Breathing and Patient connected to face mask oxygen  Post-op Assessment: Report given to RN and Post -op Vital signs reviewed and stable  Post vital signs: Reviewed and stable  Last Vitals:  Vitals Value Taken Time  BP 105/64 01/31/23 2125  Temp    Pulse 70 01/31/23 2128  Resp 20 01/31/23 2128  SpO2 93 % 01/31/23 2128  Vitals shown include unvalidated device data.  Last Pain:  Vitals:   01/31/23 1500  TempSrc: Oral  PainSc:          Complications: No notable events documented.

## 2023-01-31 NOTE — Anesthesia Procedure Notes (Signed)
Associated Order(s): Anesthesia Airway  Formatting of this note might be different from the original.  Procedure Name: Intubation  Date/Time: 01/31/2023 8:29 PM    Performed by: Tressia Miners, CRNAPre-anesthesia Checklist: Patient identified, Emergency Drugs available, Suction available and Patient being monitored  Patient Re-evaluated:Patient Re-evaluated prior to induction  Oxygen Delivery Method: Circle System Utilized  Preoxygenation: Pre-oxygenation with 100% oxygen  Induction Type: IV induction  Ventilation: Mask ventilation without difficulty  Laryngoscope Size: Mac and 4  Grade View: Grade I  Tube type: Oral  Tube size: 7.5 mm  Number of attempts: 1  Airway Equipment and Method: Stylet  Placement Confirmation: ETT inserted through vocal cords under direct vision, positive ETCO2 and breath sounds checked- equal and bilateral  Secured at: 23 cm  Tube secured with: Tape  Dental Injury: Teeth and Oropharynx as per pre-operative assessment   Comments: Smooth IV induction. Eyes taped. Easy mask. DL x 1 with grad 1 view. Atraumatically placed, teeth and lip remain intact as preop. Secured with tape, bilateral breath sounds +/=, EtCO2 +, Adequate TV, VSS      Electronically signed by Tressia Miners, CRNA at 01/31/2023  8:57 PM EDT

## 2023-01-31 NOTE — ED Triage Notes (Signed)
Formatting of this note might be different from the original.  Pt here via POV d/t a Rt index finger infection he started started 5 days ago & has been seen recently for it. States the pain is no better & its unbearable.   Electronically signed by Laurance Flatten, RN at 01/31/2023  7:13 AM EDT

## 2023-01-31 NOTE — Anesthesia Post-Procedure Evaluation (Signed)
Formatting of this note is different from the original.   Anesthesia Post Note    Patient: Lucas Hicks    Procedure(s) Performed: IRRIGATION AND DEBRIDEMENT INDEX FINGER (Right)        Patient location during evaluation: PACU  Anesthesia Type: General  Level of consciousness: awake and alert  Pain management: pain level controlled  Vital Signs Assessment: post-procedure vital signs reviewed and stable  Respiratory status: spontaneous breathing, nonlabored ventilation, respiratory function stable and patient connected to nasal cannula oxygen  Cardiovascular status: blood pressure returned to baseline and stable  Postop Assessment: no apparent nausea or vomiting  Anesthetic complications: no    No notable events documented.    Last Vitals:   Vitals:    01/31/23 2130 01/31/23 2145   BP: 102/68 (!) 109/57   Pulse: 64 67   Resp: (!) 22 (!) 24   Temp:     SpO2: 97% 99%     Last Pain:   Vitals:    01/31/23 2145   TempSrc:    PainSc: 0-No pain                   Collene Schlichter      Electronically signed by Collene Schlichter, MD at 01/31/2023 10:02 PM EDT

## 2023-01-31 NOTE — ED Notes (Signed)
Formatting of this note is different from the original.  ED TO INPATIENT HANDOFF REPORT    ED Nurse Name and Phone #:     S  Name/Age/Gender  Lucas Hicks  42 y.o.  male  Room/Bed: H015C/H015C    Code Status    Code Status: Prior    Home/SNF/Other  Home  Patient oriented to: self, place, time, and situation  Is this baseline? Yes     Triage Complete: Triage complete   Chief Complaint  Abscess of finger [L02.519]    Triage Note  Pt here via POV d/t a Rt index finger infection he started started 5 days ago & has been seen recently for it. States the pain is no better & its unbearable.      Allergies  Allergies   Allergen Reactions    Amoxicillin Hives and Other (See Comments)     Did it involve swelling of the face/tongue/throat, SOB, or low BP? No  Did it involve sudden or severe rash/hives, skin peeling, or any reaction on the inside of your mouth or nose? Yes  Did you need to seek medical attention at a hospital or doctor's office? Yes  When did it last happen?     42 yrs old  If all above answers are "NO", may proceed with cephalosporin use.     Fish Allergy Hives    Prednisone Other (See Comments)     irritable     Level of Care/Admitting Diagnosis  ED Disposition       ED Disposition   Admit    Condition   --    Comment   Hospital Area: [facility] [[id number]]   Level of Care: Med-Surg [16]   May place patient in observation at [facility] or [facility] if equivalent level of care is available:: No   Covid Evaluation: Asymptomatic - no recent exposure (last 10 days) testing not required   Diagnosis: Abscess of finger [164035]   Admitting Physician: Clydie Braun [[id number]]   Attending Physician: Clydie Braun [[id number]]            B  Medical/Surgery History  Past Medical History:   Diagnosis Date    CHF (congestive heart failure) (HCC)     Exposure to hepatitis B     Exposure to hepatitis C     Hepatitis C     History of opioid abuse (HCC)     reports heroin abuse, ending around 2017     Hypertension     IV drug abuse (HCC)     Renal disorder     kidney stones     Past Surgical History:   Procedure Laterality Date    APPENDECTOMY      EYE SURGERY      secondary to dog bite    TIBIA FRACTURE SURGERY Right        A  IV Location/Drains/Wounds  Patient Lines/Drains/Airways Status       Active Line/Drains/Airways       Name Placement date Placement time Site Days    Peripheral IV 01/31/23 18 G Anterior;Distal;Left;Upper Arm 01/31/23  1040  Arm  less than 1               Intake/Output Last 24 hours  No intake or output data in the 24 hours ending 01/31/23 1206    Labs/Imaging  Results for orders placed or performed during the hospital encounter of 01/31/23 (from the past 48 hour(s))  CBC with Differential     Status: Abnormal    Collection Time: 01/31/23  9:32 AM   Result Value Ref Range    WBC 5.8 4.0 - 10.5 K/uL    RBC 3.90 (L) 4.22 - 5.81 MIL/uL    Hemoglobin 11.4 (L) 13.0 - 17.0 g/dL    HCT 32.4 (L) 40.1 - 52.0 %    MCV 91.3 80.0 - 100.0 fL    MCH 29.2 26.0 - 34.0 pg    MCHC 32.0 30.0 - 36.0 g/dL    RDW 02.7 25.3 - 66.4 %    Platelets 348 150 - 400 K/uL    nRBC 0.0 0.0 - 0.2 %    Neutrophils Relative % 58 %    Neutro Abs 3.3 1.7 - 7.7 K/uL    Lymphocytes Relative 28 %    Lymphs Abs 1.6 0.7 - 4.0 K/uL    Monocytes Relative 12 %    Monocytes Absolute 0.7 0.1 - 1.0 K/uL    Eosinophils Relative 1 %    Eosinophils Absolute 0.1 0.0 - 0.5 K/uL    Basophils Relative 1 %    Basophils Absolute 0.0 0.0 - 0.1 K/uL    Immature Granulocytes 0 %    Abs Immature Granulocytes 0.02 0.00 - 0.07 K/uL     Comment: Performed at [facility], [address] [place], [place] [address]   Basic metabolic panel     Status: Abnormal    Collection Time: 01/31/23  9:32 AM   Result Value Ref Range    Sodium 136 135 - 145 mmol/L    Potassium 3.1 (L) 3.5 - 5.1 mmol/L    Chloride 103 98 - 111 mmol/L    CO2 23 22 - 32 mmol/L    Glucose, Bld 110 (H) 70 - 99 mg/dL     Comment: Glucose reference range applies only to samples taken  after fasting for at least 8 hours.    BUN 8 6 - 20 mg/dL    Creatinine, Ser 4.25 0.61 - 1.24 mg/dL    Calcium 8.4 (L) 8.9 - 10.3 mg/dL    GFR, Estimated >95 >63 mL/min     Comment: (NOTE)  Calculated using the CKD-EPI Creatinine Equation (2021)     Anion gap 10 5 - 15     Comment: Performed at [facility], [address] [place], [place] [address]   Ethanol     Status: None    Collection Time: 01/31/23  9:32 AM   Result Value Ref Range    Alcohol, Ethyl (B) <10 <10 mg/dL     Comment: (NOTE)  Lowest detectable limit for serum alcohol is 10 mg/dL.    For medical purposes only.  Performed at [facility], [address], [place], [place]  [address]      No results found.    Pending Labs  Unresulted Labs (From admission, onward)       Start     Ordered    01/31/23 0909  Rapid urine drug screen (hospital performed)  ONCE - STAT,   STAT         01/31/23 0908               Vitals/Pain  Today's Vitals    01/31/23 0711 01/31/23 0713 01/31/23 1135   BP: (!) 126/95  108/85   Pulse: 81  78   Resp: 20  19   Temp: 98.4 F (36.9 C)  98.5 F (  36.9 C)   TempSrc: [place]  [place]   SpO2: 96%  100%   PainSc:  10-Worst pain ever      Isolation Precautions  No active isolations    Medications  Medications   vancomycin (VANCOCIN) IVPB 1000 mg/200 mL premix (has no administration in time range)   vancomycin (VANCOREADY) IVPB 1500 mg/300 mL (1,500 mg Intravenous New Bag/Given 01/31/23 1141)   cefTRIAXone (ROCEPHIN) 2 g in sodium chloride 0.9 % 100 mL IVPB (0 g Intravenous Stopped 01/31/23 1130)   metroNIDAZOLE (FLAGYL) IVPB 500 mg (0 mg Intravenous Stopped 01/31/23 1205)   vancomycin (VANCOCIN) IVPB 1000 mg/200 mL premix (has no administration in time range)   doxycycline (VIBRA-TABS) tablet 100 mg (100 mg [place] Given 01/31/23 0739)   ibuprofen (ADVIL) tablet 800 mg (800 mg [place] Given 01/31/23 0739)   acetaminophen (TYLENOL) tablet 650 mg (650 mg [place] Given 01/31/23 1610)     Mobility  walks      Focused  Assessments    R  Recommendations: See Admitting Provider Note    Report given to:     Additional Notes:       Electronically signed by Shary Key, RN at 01/31/2023 12:06 PM EDT

## 2023-01-31 NOTE — Unmapped (Signed)
Formatting of this note is different from the original.  Reason for Consult:Right index finger infection  Referring Physician: Alvira Monday  Time called: 0740  Time at bedside: 0854    Ramzy Cappelletti is an 42 y.o. male.   HPI: Lucas Hicks comes in with a 3-4d hx/o right index finger infection. He was seen in the ED yesterday and had an I&D performed. He was sent out on doxy but was unable to get his rx. He returns with increased pain and swelling. He denies any antecedent event. He is an active IVDU.     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 He has never used smokeless tobacco. He reports that he does not currently use drugs after having used the following drugs: IV and Cocaine. He reports that he does not drink alcohol.    Allergies:   Allergies   Allergen Reactions    Amoxicillin Hives and Other (See Comments)     Did it involve swelling of the face/tongue/throat, SOB, or low BP? No  Did it involve sudden or severe rash/hives, skin peeling, or any reaction on the inside of your mouth or nose? Yes  Did you need to seek medical attention at a hospital or doctor's office? Yes  When did it last happen?     42 yrs old  If all above answers are "NO", may proceed with cephalosporin use.     Fish Allergy Hives    Prednisone Other (See Comments)     irritable     Medications: I have  reviewed the patient's current medications.    No results found for this or any previous visit (from the past 48 hour(s)).    No results found.    Review of Systems   Constitutional:  Negative for chills, diaphoresis and fever.   HENT:  Negative for ear discharge, ear pain, hearing loss and tinnitus.    Eyes:  Negative for photophobia and pain.   Respiratory:  Negative for cough and shortness of breath.    Cardiovascular:  Negative for chest pain.   Gastrointestinal:  Negative for abdominal pain, nausea and vomiting.   Genitourinary:  Negative for dysuria, flank pain, frequency and urgency.   Musculoskeletal:  Positive for arthralgias (Right index finger). Negative for back pain, myalgias and neck pain.   Neurological:  Negative for dizziness and headaches.   Hematological:  Does not bruise/bleed easily.   Psychiatric/Behavioral:  The patient is not nervous/anxious.  Blood pressure (!) 126/95, pulse 81, temperature 98.4 F (36.9 C), temperature source [place], resp. rate 20, SpO2 96 %.  Physical Exam  Constitutional:       General: He is not in acute distress.     Appearance: He is well-developed. He is not diaphoretic.   HENT:      Head: Normocephalic and atraumatic.   Eyes:      General: No scleral icterus.        Right eye: No discharge.         Left eye: No discharge.      Conjunctiva/sclera: Conjunctivae normal.   Cardiovascular:      Rate and Rhythm: Normal rate and regular rhythm.   Pulmonary:      Effort: Pulmonary effort is normal. No respiratory distress.   Musculoskeletal:      Cervical back: Normal range of motion.      Comments: Right shoulder, elbow, wrist, digits- no skin wounds, index finger superficial distal abscess, severe TTP, able to move PIP, MCP without pain, no flexor TTP, no instability, no blocks to motion   Sens  Ax/R/M/U intact   Mot   Ax/ R/ PIN/ M/ AIN/ U intact   Rad 2+   Skin:     General: Skin is warm and dry.   Neurological:      Mental Status: He is alert.   Psychiatric:          Mood and Affect: Mood normal.         Behavior: Behavior normal.     Assessment/Plan:  Right index finger infection -- Given pt's discomfort and reliability issues I think the prudent thing to do is admit for IV abx and plan I&D with Dr. Merlyn Lot this afternoon. Please keep NPO.    Freeman Caldron, PA-C  Orthopedic Surgery  [phone]  01/31/2023, 9:22 AM     Addendum (01/31/23):  Seen and examined.  Agree with above.  H: state he has had increasing pain and swelling right index finger over past couple of days.  Had I&D in ED last night, but worsening today.  No fevers or chills.  Finger is painful.  E: intact sensation and capillary refill all digits.  Right index with fluctuance under superficial skin dorsoradial middle phalanx and distal phalanx.  Second small purulent area on proximal phalanx.  No proximal streaking.  Very tender to palpation over fluctuant area.  Non tender over flexor tendon.  Non tender at pip joint and can move mp and pip joints without pain.  A/P: right index finger abscess. Recommend incision and drainage in OR.  Risks, benefits and alternatives of surgery were discussed including risks of blood loss, infection, damage to nerves/vessels/tendons/ligament/bone, failure of surgery, need for additional surgery, complication with wound healing, stiffness, need for repeat irrigation and debridement.  He voiced understanding of these risks and elected to proceed.    Electronically signed by Betha Loa, MD at 01/31/2023  7:54 PM EDT

## 2023-01-31 NOTE — Anesthesia Pre-Procedure Evaluation (Signed)
Formatting of this note is different from the original.                                    Anesthesia Evaluation   Patient identified by MRN, date of birth, ID band  Patient awake    Reviewed:  Allergy & Precautions, NPO status , Patient's Chart, lab work & pertinent test results    Airway  Mallampati: II    TM Distance: >3 FB  Neck ROM: Full     Dental    (+) Teeth Intact, Dental Advisory Given    Pulmonary  asthma , former smoker    Pulmonary exam normal  breath sounds clear to auscultation     Cardiovascular  hypertension, + Past MI and +CHF   Normal cardiovascular exam  Rhythm:Regular Rate:Normal      Neuro/Psych   PSYCHIATRIC DISORDERS Anxiety Depression    CVA     GI/Hepatic  negative GI ROS,,,(+)      substance abuse   , Hepatitis -, C   Endo/Other   negative endocrine ROS     Renal/GU  Renal disease       Musculoskeletal    (+)  narcotic dependentRight index finger infection    Abdominal    Peds   Hematology    (+) Blood dyscrasia, anemia    Anesthesia Other Findings  Day of surgery medications reviewed with the patient.   Reproductive/Obstetrics              Anesthesia Physical  Anesthesia Plan    ASA: 3 and emergent    Anesthesia Plan: General     Post-op Pain Management: Tylenol PO (pre-op)*     Induction: Intravenous and Rapid sequence    PONV Risk Score and Plan: 2 and Midazolam, Dexamethasone and Ondansetron    Airway Management Planned: Oral ETT    Additional Equipment:     Intra-op Plan:     Post-operative Plan: Extubation in OR    Informed Consent: I have reviewed the patients History and Physical, chart, labs and discussed the procedure including the risks, benefits and alternatives for the proposed anesthesia with the patient or authorized representative who has indicated his/her understanding and acceptance.     Dental advisory given    Plan Discussed with: CRNA    Anesthesia Plan Comments:      Anesthesia Quick Evaluation    Electronically signed by Collene Schlichter, MD at 01/31/2023  6:53 PM  EDT

## 2023-01-31 NOTE — Progress Notes (Signed)
Formatting of this note might be different from the original.  Postop note:  Status post incision and drainage right index finger.  Thin watery fluid encountered over middle phalanx.  Small amount of purulence over proximal phalanx.  Cultures taken.  Wound packed.  Start hydrotherapy in 2 to 4 days.  Okay for DVT prophylaxis.  Okay for discharge home when afebrile, white blood count trending down, pain controlled.  Can start hydrotherapy in office.  Recommend Bactrim or doxycycline on discharge if indicated.  Electronically signed by Betha Loa, MD at 01/31/2023  9:05 PM EDT

## 2023-01-31 NOTE — Unmapped (Signed)
Formatting of this note might be different from the original.  NAME: Lucas Hicks  MEDICAL RECORD NO: [id number]  DATE OF BIRTH: [birth year]  FACILITY: [facility]  LOCATION: [facility] OR  PHYSICIAN: Tami Ribas, MD    OPERATIVE REPORT    DATE OF PROCEDURE: 01/31/23     PREOPERATIVE DIAGNOSIS: Right index finger abscess    POSTOPERATIVE DIAGNOSIS: Right index finger abscess    PROCEDURE: Incision and drainage right index finger abscess    SURGEON:  Betha Loa, M.D.    ASSISTANT: none    ANESTHESIA:  General    INTRAVENOUS FLUIDS:  Per anesthesia flow sheet.    ESTIMATED BLOOD LOSS:  Minimal.    COMPLICATIONS:  None.    SPECIMENS: Cultures to micro    TOURNIQUET TIME:      Total Tourniquet Time Documented:  Upper Arm (Right) - 16 minutes  Total: Upper Arm (Right) - 16 minutes      DISPOSITION:  Stable to PACU.    INDICATIONS: 42 year old male states he has had increasing swelling pain and redness of the right index finger over the past few days.  He was seen in the emergency department yesterday and states that the area was incised but has reaccumulated and is increasingly painful.  No fevers or chills.  I recommend incision and drainage in the operating room.  Risks, benefits and alternatives of surgery were discussed including the risks of blood loss, infection, damage to nerves, vessels, tendons, ligaments, bone for surgery, need for additional surgery, complications with wound healing, continued pain, stiffness, , need for repeat irrigation and debridement.  He voiced understanding of these risks and elected to proceed.    OPERATIVE COURSE:  After being identified preoperatively by myself,  the patient and I agreed on the procedure and site of the procedure.  The surgical site was marked.  Surgical consent had been signed. Antibiotics were held for intraoperative cultures. He was transferred to the operating room and placed on the operating table in supine position with the Right upper extremity on an arm board.   General anesthesia was induced by the anesthesiologist.  Right upper extremity was prepped and draped in normal sterile orthopedic fashion.  A surgical pause was performed between the surgeons, anesthesia, and operating room staff and all were in agreement as to the patient, procedure, and site of procedure.  Tourniquet at the proximal aspect of the extremity was inflated to 250 mmHg after exsanguination of the arm with an Esmarch bandage.  Incision was made over the fluctuant area on the dorsal radial aspect of the middle phalanx.  There was thin watery fluid underneath the skin.  This was not into the subcutaneous tissues.  Cultures were taken for aerobes and anaerobes.  The skin was raw and thinned.  Incision was made at the dorsum.  No purulence was encountered in the subcutaneous tissues.  There was an additional purulent spot over the proximal phalanx.  Again an incision was made in this area.  Gross purulence was encountered.  Cultures were taken of this as well.  The skin was incised.  There is no gross purulence in the subcutaneous tissues.  The wounds were copiously irrigated with sterile saline.  The scissors were used to debride devitalized skin over the middle phalanx to prevent reaccumulation of fluid.  The devitalized skin was removed.  The wounds were packed with quarter inch iodoform gauze.  Digital block was performed with quarter percent plain Marcaine to aid in postoperative analgesia.  Xeroform was placed over the raw areas of skin on the middle phalanx.  The wounds were dressed with sterile 4 x 4's and wrapped with a Kerlix bandage.  Volar splint is placed and wrapped with Kerlix and Ace bandage.  The tourniquet was deflated at 16 minutes.  Fingertips were pink with brisk capillary refill after deflation of tourniquet.  The operative  drapes were broken down.  The patient was awoken from anesthesia safely.  He was transferred back to the stretcher and taken to PACU in stable condition.  He is  admitted to the hospitalist for IV antibiotics.    Betha Loa, MD  Electronically signed, 01/31/23  Electronically signed by Betha Loa, MD at 01/31/2023  9:01 PM EDT

## 2023-01-31 NOTE — Progress Notes (Signed)
Formatting of this note is different from the original.  Pharmacy Antibiotic Note    Lucas Hicks is a 42 y.o. male admitted on 01/31/2023 with  wound infection .  Pharmacy has been consulted for Vancomycin dosing.    CrCl 70-80 mL/min    Plan:  Vancomycin 1500mg  IV once then 1000mg  Q12H (eAUC 532, Scr 1.2, Vd 0.72)  - On-call dose of 1000mg  for OR also ordered'  CTX 2g Q24H  Flagyl 500mg  IV Q12H        Temp (24hrs), Avg:98.4 F (36.9 C), Min:98.4 F (36.9 C), Max:98.4 F (36.9 C)    No results for input(s): "WBC", "CREATININE", "LATICACIDVEN", "VANCOTROUGH", "VANCOPEAK", "VANCORANDOM", "GENTTROUGH", "GENTPEAK", "GENTRANDOM", "TOBRATROUGH", "TOBRAPEAK", "TOBRARND", "AMIKACINPEAK", "AMIKACINTROU", "AMIKACIN" in the last 168 hours.   Estimated Creatinine Clearance: 78.6 mL/min (by C-G formula based on SCr of 1.2 mg/dL).      Allergies   Allergen Reactions    Amoxicillin Hives and Other (See Comments)     Did it involve swelling of the face/tongue/throat, SOB, or low BP? No  Did it involve sudden or severe rash/hives, skin peeling, or any reaction on the inside of your mouth or nose? Yes  Did you need to seek medical attention at a hospital or doctor's office? Yes  When did it last happen?     42 yrs old  If all above answers are "NO", may proceed with cephalosporin use.     Fish Allergy Hives    Prednisone Other (See Comments)     irritable     Thank you for allowing pharmacy to be a part of this patient?s care.    Ellis Savage, PharmD  Clinical Pharmacist  01/31/2023 9:36 AM    Electronically signed by Ellis Savage, RPH at 01/31/2023 10:02 AM EDT

## 2023-01-31 NOTE — Unmapped (Signed)
Formatting of this note is different from the original.  Immediate Anesthesia Transfer of Care Note    Patient: Lucas Hicks    Procedure(s) Performed: IRRIGATION AND DEBRIDEMENT INDEX FINGER (Right)    Patient Location: PACU    Anesthesia Type:General    Level of Consciousness: awake and alert     Airway & Oxygen Therapy: Patient Spontanous Breathing and Patient connected to face mask oxygen    Post-op Assessment: Report given to RN and Post -op Vital signs reviewed and stable    Post vital signs: Reviewed and stable    Last Vitals:   Vitals Value Taken Time   BP 105/64 01/31/23 2125   Temp     Pulse 70 01/31/23 2128   Resp 20 01/31/23 2128   SpO2 93 % 01/31/23 2128   Vitals shown include unvalidated device data.    Last Pain:   Vitals:    01/31/23 1500   TempSrc: [place]   PainSc:            Complications: No notable events documented.  Electronically signed by Tressia Miners, CRNA at 01/31/2023  9:29 PM EDT

## 2023-01-31 NOTE — H&P (Signed)
Formatting of this note is different from the original.  Images from the original note were not included.    History and Physical     Patient: Lucas Hicks ZOX:[id number] DOB: March 29, 1981  DOA: 01/31/2023  DOS: the patient was seen and examined on 01/31/2023  PCP: [facility]   Patient coming from: Home    Chief Complaint:   Chief Complaint   Patient presents with    Finger Infection     HPI: Lucas Hicks is a 42 y.o. male with medical history significant of hypertension,IVDA, substance induced mood disorder, and polysubstance abuse who presents with complaints of a infection of his right index finger.  Patient states he is unsure of how the injury to his right index finger had initially occurred, but states that he noticed it initially 4 to 5 days ago.  Pain in the finger got acutely worsened yesterday and he had been seen in the emergency department for evaluation.  The ED provider had performed I&D and patient was had been prescribed doxycycline, but was unable to pick it up.  Patient reports inability to flex his right finger due to severe pain.  He also reports that he has been more anxious.  He does admit to recently using cocaine.  His wife is present at bedside and provides additional history and notes that the patient had not been himself.  He has been paranoid with delusions stating that undercover police are chasing him asking random people on the street.  Patient admits that this is true and states symptoms were better when he had been on Lamictal, but had not taken it in 3 weeks.    In the emergency department patient was found to be afebrile with stable vital signs.  Labs noted WBC 5.8, hemoglobin 11.4, potassium 3.1, and calcium 8.4,.  Patient has been given initial antibiotics of doxycycline, ibuprofen, and Tylenol.  Dr. Porfirio Mylar of hand surgery had been consulted with plans for taken for I&D.  He has been transition to IV antibiotics of vancomycin, doxycycline, and  Rocephin.  Review of Systems: As mentioned in the history of present illness. All other systems reviewed and are negative.  Past Medical History:   Diagnosis Date    CHF (congestive heart failure) (HCC)     Exposure to hepatitis B     Exposure to hepatitis C     Hepatitis C     History of opioid abuse (HCC)     reports heroin abuse, ending around 2017    Hypertension     IV drug abuse (HCC)     Renal disorder     kidney stones     Past Surgical History:   Procedure Laterality Date    APPENDECTOMY      EYE SURGERY      secondary to dog bite    TIBIA FRACTURE SURGERY Right      Social History:  reports that he has quit smoking. He has never used smokeless tobacco. He reports that he does not currently use drugs after having used the following drugs: IV and Cocaine. He reports that he does not drink alcohol.    Allergies   Allergen Reactions    Amoxicillin Hives and Other (See Comments)     Did it involve swelling of the face/tongue/throat, SOB, or low BP? No  Did it involve sudden or severe rash/hives, skin peeling, or any reaction on the inside of your mouth or nose? Yes  Did you need to  seek medical attention at a hospital or doctor's office? Yes  When did it last happen?     42 yrs old  If all above answers are "NO", may proceed with cephalosporin use.     Fish Allergy Hives    Prednisone Other (See Comments)     irritable     Family History   Problem Relation Age of Onset    Alcohol abuse Father     Melanoma Mother     Breast cancer Mother     Colon cancer Neg Hx     Prostate cancer Neg Hx      Prior to Admission medications    Medication Sig Start Date End Date Taking? Authorizing Provider   acetaminophen (TYLENOL) 500 MG tablet Take 1 tablet (500 mg total) by mouth every 8 (eight) hours as needed for moderate pain.  Patient not taking: Reported on 01/31/2023 01/18/23   Leroy Sea, MD   albuterol (VENTOLIN HFA) 108 (90 Base) MCG/ACT inhaler Inhale 2 puffs into the lungs every 6 (six) hours as needed for  wheezing or shortness of breath (shortness of breath or wheezing).  Patient not taking: Reported on 01/31/2023 01/18/23   Leroy Sea, MD   alfuzosin (UROXATRAL) 10 MG 24 hr tablet Take 1 tablet by mouth daily.  Patient not taking: Reported on 01/31/2023    [provider]   doxycycline (VIBRAMYCIN) 100 MG capsule Take 1 capsule (100 mg total) by mouth 2 (two) times daily. One po bid x 7 days  Patient not taking: Reported on 01/31/2023 01/30/23   Melene Plan, DO   lamoTRIgine (LAMICTAL) 200 MG tablet Take 200 mg by mouth daily.  Patient not taking: Reported on 01/31/2023    [provider]   levofloxacin (LEVAQUIN) 750 MG tablet Take 1 tablet (750 mg total) by mouth daily.  Patient not taking: Reported on 01/31/2023 01/18/23   Leroy Sea, MD   pantoprazole (PROTONIX) 40 MG tablet Take 1 tablet (40 mg total) by mouth daily. 07/17/19 08/03/19  Clapacs, Jackquline Denmark, MD     Physical Exam:  Vitals:    01/31/23 0711 01/31/23 1135   BP: (!) 126/95 108/85   Pulse: 81 78   Resp: 20 19   Temp: 98.4 F (36.9 C) 98.5 F (36.9 C)   TempSrc: Oral Oral   SpO2: 96% 100%     Constitutional: Middle-age male who appears to be in some distress  Eyes: PERRL, lids and conjunctivae normal  ENMT: Mucous membranes are moist. Posterior pharynx clear of any exudate or lesions.Normal dentition.   Neck: normal, supple, no masses, no thyromegaly  Respiratory: clear to auscultation bilaterally, no wheezing, no crackles. Normal respiratory effort. No accessory muscle use.   Cardiovascular: Regular rate and rhythm, no murmurs / rubs / gallops. No extremity edema. 2+ pedal pulses. No carotid bruits.   Abdomen: no tenderness, no masses palpated. No hepatosplenomegaly. Bowel sounds positive.   Musculoskeletal: no clubbing / cyanosis.  Swelling noted of the right index finger with concern for abscess present to the distal aspect.  Some decrease in range of motion appears present secondary to pain  skin: Significant swelling of the  right index finger  Neurologic: CN 2-12 grossly intact.  Strength 5/5 in all 4.   Psychiatric: Alert.  Patient does report cops are after him.  Seems very anxious.    Data Reviewed:    Reviewed labs, imaging, and pertinent records as noted above in the HPI  Assessment and  Plan:  Infection of the right index finger with abscess  Acute.  Patient presents with complaints of  infection of his right index finger.  Unclear the cause of injury to onset symptoms.  Patient had gone I&D and been prescribed doxycycline, but with unable to pick up medication and presented back to the hospital with worsening pain.  Orthopedics consulted and plan on I&D later this afternoon with Dr. Merlyn Lot.  Patient has been placed on empiric antibiotics of vancomycin and Rocephin  -Admit to MedSurg bed  -N.p.o. for need of I&D  -Continue empiric antibiotics of vancomycin and Rocephin  -Hydrocodone as needed for pain    Hypokalemia  Acute.  Initial potassium 3.1.  -Give 40 mEq of potassium chloride p.o.  -Continue to monitor and replace as needed    Paranoid delusions  Anxiety  Patient wife makes note that he is constantly thinking that the cops are coming to get him asking random people on the street.  He had previous been on Lamictal 200 mg.  Wife request that psychiatry see him in regards to these delusions.  Unclear if these delusions are associated with his cocaine abuse, but peers has been diagnosed with mood disorder related to substance previously in the past.  -Resume Lamictal at 100mg  with plans to increase back to 200 mg daily  -Recommend formal psychiatry evaluation once patient medically stable    History of hepatitis C  Patient had previously been recommended to have follow-up arranged by PCP in the outpatient setting with GI and ID    Polysubstance abuse  History of IV drug use  Cocaine abuse  Patient did admit to recently using cocaine.  -Follow-up UDS  -Ativan IV as needed in the setting o of possible acute intoxication    DVT  prophylaxis: Lovenox  Advance Care Planning:   Code Status: Full Code      Consults: Hand surgery    Family Communication: Wife updated at bedside    Severity of Illness:  The appropriate patient status for this patient is OBSERVATION. Observation status is judged to be reasonable and necessary in order to provide the required intensity of service to ensure the patient's safety. The patient's presenting symptoms, physical exam findings, and initial radiographic and laboratory data in the context of their medical condition is felt to place them at decreased risk for further clinical deterioration. Furthermore, it is anticipated that the patient will be medically stable for discharge from the hospital within 2 midnights of admission.     Author:  Clydie Braun, MD  01/31/2023 11:46 AM    For on call review [email or web address].   Electronically signed by Clydie Braun, MD at 01/31/2023  3:15 PM EDT

## 2023-01-31 NOTE — ED Provider Notes (Signed)
Formatting of this note is different from the original.  Images from the original note were not included.    [facility] EMERGENCY DEPARTMENT AT [facility]  Provider Note    CSN: [id number]  Arrival date & time: 01/31/23  0701        History    Chief Complaint   Patient presents with    Finger Infection     Lucas Hicks is a 42 y.o. male.    Patient with history of polysubstance abuse, admission for overdose and pneumonia earlier in the month --presents to the emergency department today for continued right index finger pain.  Symptoms have been ongoing for about 4 days.  Patient presented early yesterday morning for possible finger infection.  Patient was given a digital block and had an I&D performed.  He states that his symptoms have persisted.  He states that the digital block dulled the pain for a little while, but is not sufficient to control his pain.  He denies fevers.  He has not filled the doxycycline prescribed at previous visit.        Home Medications  Prior to Admission medications    Medication Sig Start Date End Date Taking? Authorizing Provider   acetaminophen (TYLENOL) 500 MG tablet Take 1 tablet (500 mg total) by mouth every 8 (eight) hours as needed for moderate pain. 01/18/23   Leroy Sea, MD   albuterol (VENTOLIN HFA) 108 (90 Base) MCG/ACT inhaler Inhale 2 puffs into the lungs every 6 (six) hours as needed for wheezing or shortness of breath (shortness of breath or wheezing). 01/18/23   Leroy Sea, MD   alfuzosin (UROXATRAL) 10 MG 24 hr tablet Take 1 tablet by mouth daily.    [provider]   doxycycline (VIBRAMYCIN) 100 MG capsule Take 1 capsule (100 mg total) by mouth 2 (two) times daily. One po bid x 7 days 01/30/23   Melene Plan, DO   lamoTRIgine (LAMICTAL) 200 MG tablet Take 200 mg by mouth daily.    [provider]   levofloxacin (LEVAQUIN) 750 MG tablet Take 1 tablet (750 mg total) by mouth daily. 01/18/23   Leroy Sea, MD   pantoprazole  (PROTONIX) 40 MG tablet Take 1 tablet (40 mg total) by mouth daily. 07/17/19 08/03/19  Clapacs, Jackquline Denmark, MD       Allergies     Amoxicillin, Fish allergy, and Prednisone      Review of Systems    Review of Systems    Physical Exam  Updated Vital Signs  BP (!) 126/95   Pulse 81   Temp 98.4 F (36.9 C) (Oral)   Resp 20   SpO2 96%   Physical Exam  Vitals and nursing note reviewed.   Constitutional:       Appearance: He is well-developed.   HENT:      Head: Normocephalic and atraumatic.   Eyes:      Conjunctiva/sclera: Conjunctivae normal.   Cardiovascular:      Pulses: Normal pulses. No decreased pulses.   Musculoskeletal:         General: Tenderness present.      Cervical back: Normal range of motion and neck supple.      Right lower leg: No edema.      Left lower leg: No edema.      Comments: Right index finger: Diffuse swelling of the finger generally, but involves more of the dorsum of the finger.  There is erythema  that extends down towards the MCP joint dorsally.  There is blistering and a subcutaneous fluid collection noted overlying the DIP joint.  Patient has significant pain with any motion of the digit, including passive extension cap refill less than 2 seconds.  Minimal tenderness to palpation over the flexor tendon sheath.   Skin:     General: Skin is warm and dry.   Neurological:      Mental Status: He is alert.      Sensory: No sensory deficit.      Comments: Motor, sensation, and vascular distal to the injury is fully intact.    Psychiatric:         Mood and Affect: Mood normal.     ED Results / Procedures / Treatments    Labs  (all labs ordered are listed, but only abnormal results are displayed)  Labs Reviewed   CBC WITH DIFFERENTIAL/PLATELET - Abnormal; Notable for the following components:       Result Value    RBC 3.90 (*)     Hemoglobin 11.4 (*)     HCT 35.6 (*)     All other components within normal limits   BASIC METABOLIC PANEL - Abnormal; Notable for the following components:    Potassium  3.1 (*)     Glucose, Bld 110 (*)     Calcium 8.4 (*)     All other components within normal limits   ETHANOL   RAPID URINE DRUG SCREEN, HOSP PERFORMED     EKG  None    Radiology  No results found.    Procedures  Procedures     Medications Ordered in ED  Medications   vancomycin (VANCOCIN) IVPB 1000 mg/200 mL premix (has no administration in time range)   vancomycin (VANCOREADY) IVPB 1500 mg/300 mL (1,500 mg Intravenous New Bag/Given 01/31/23 1141)   cefTRIAXone (ROCEPHIN) 2 g in sodium chloride 0.9 % 100 mL IVPB (0 g Intravenous Stopped 01/31/23 1130)   metroNIDAZOLE (FLAGYL) IVPB 500 mg (500 mg Intravenous New Bag/Given 01/31/23 1051)   vancomycin (VANCOCIN) IVPB 1000 mg/200 mL premix (has no administration in time range)   doxycycline (VIBRA-TABS) tablet 100 mg (100 mg Oral Given 01/31/23 0739)   ibuprofen (ADVIL) tablet 800 mg (800 mg Oral Given 01/31/23 0739)   acetaminophen (TYLENOL) tablet 650 mg (650 mg Oral Given 01/31/23 1610)     ED Course/ Medical Decision Making/ A&P      Patient seen and examined. History obtained directly from patient.  I reviewed recent ED visits and hospitalization notes.    Labs/EKG: None ordered    Imaging: None ordered    Medications/Fluids: Ordered: Doxycycline.  Given previous history of opioid abuse and life-threatening overdose, will treat pain with ibuprofen and Tylenol.     Most recent vital signs reviewed and are as follows:  BP (!) 126/95   Pulse 81   Temp 98.4 F (36.9 C) (Oral)   Resp 20   SpO2 96%     Initial impression: Right index finger infection    11:48 AM Reassessment performed. Patient has been stable.    I consulted orthopedics who has seen the patient.  They recommend patient be admitted for IV antibiotics and a plan debridement in the operating room later this afternoon.  Request NPO.    Labs personally reviewed and interpreted including: CBC normal white blood cell count, hemoglobin 11.4 otherwise unremarkable; BMP potassium is low at 3.1; alcohol negative;  UDS pending.    Most current vital  signs reviewed and are as follows:  BP 108/85 (BP Location: Left Arm)   Pulse 78   Temp 98.5 F (36.9 C) (Oral)   Resp 19   SpO2 100%     Plan: Admit to hospital, OR I&D    I have consulted with Dr. Katrinka Blazing of [facility] hospitalist who will see patient for admission.        Medical Decision Making  Amount and/or Complexity of Data Reviewed  Labs: ordered.    Risk  OTC drugs.  Prescription drug management.  Decision regarding hospitalization.    Social determinants of health care affected by patient's ongoing drug use and noncompliance.    Final Clinical Impression(s) / ED Diagnoses  Final diagnoses:   Abscess of right index finger   Hypokalemia     Rx / DC Orders  ED Discharge Orders       None           Desmond Dike  01/31/23 1150      Rondel Baton, MD  02/01/23 1021    Electronically signed by Rondel Baton, MD at 02/01/2023 10:21 AM EDT    Associated attestation - Rondel Baton, MD - 02/01/2023 10:21 AM EDT  Formatting of this note might be different from the original.  Attestation: Medical screening examination/treatment/procedure(s) were performed by non-physician practitioner and as supervising physician I was immediately available for consultation/collaboration.

## 2023-02-01 ENCOUNTER — Encounter (HOSPITAL_COMMUNITY): Payer: Self-pay | Admitting: Orthopedic Surgery

## 2023-02-01 DIAGNOSIS — F14159 Cocaine abuse with cocaine-induced psychotic disorder, unspecified: Secondary | ICD-10-CM | POA: Diagnosis present

## 2023-02-01 DIAGNOSIS — Z87442 Personal history of urinary calculi: Secondary | ICD-10-CM | POA: Diagnosis not present

## 2023-02-01 DIAGNOSIS — I10 Essential (primary) hypertension: Secondary | ICD-10-CM | POA: Diagnosis present

## 2023-02-01 DIAGNOSIS — Z808 Family history of malignant neoplasm of other organs or systems: Secondary | ICD-10-CM | POA: Diagnosis not present

## 2023-02-01 DIAGNOSIS — Z811 Family history of alcohol abuse and dependence: Secondary | ICD-10-CM | POA: Diagnosis not present

## 2023-02-01 DIAGNOSIS — Z91013 Allergy to seafood: Secondary | ICD-10-CM | POA: Diagnosis not present

## 2023-02-01 DIAGNOSIS — Z91148 Patient's other noncompliance with medication regimen for other reason: Secondary | ICD-10-CM | POA: Diagnosis not present

## 2023-02-01 DIAGNOSIS — L02519 Cutaneous abscess of unspecified hand: Secondary | ICD-10-CM | POA: Diagnosis present

## 2023-02-01 DIAGNOSIS — Z880 Allergy status to penicillin: Secondary | ICD-10-CM | POA: Diagnosis not present

## 2023-02-01 DIAGNOSIS — Z8673 Personal history of transient ischemic attack (TIA), and cerebral infarction without residual deficits: Secondary | ICD-10-CM | POA: Diagnosis not present

## 2023-02-01 DIAGNOSIS — E876 Hypokalemia: Secondary | ICD-10-CM | POA: Diagnosis present

## 2023-02-01 DIAGNOSIS — Z8701 Personal history of pneumonia (recurrent): Secondary | ICD-10-CM | POA: Diagnosis not present

## 2023-02-01 DIAGNOSIS — F1410 Cocaine abuse, uncomplicated: Secondary | ICD-10-CM | POA: Diagnosis not present

## 2023-02-01 DIAGNOSIS — B182 Chronic viral hepatitis C: Secondary | ICD-10-CM | POA: Diagnosis present

## 2023-02-01 DIAGNOSIS — I252 Old myocardial infarction: Secondary | ICD-10-CM | POA: Diagnosis not present

## 2023-02-01 DIAGNOSIS — F22 Delusional disorders: Secondary | ICD-10-CM | POA: Diagnosis not present

## 2023-02-01 DIAGNOSIS — Z8619 Personal history of other infectious and parasitic diseases: Secondary | ICD-10-CM | POA: Diagnosis not present

## 2023-02-01 DIAGNOSIS — J45909 Unspecified asthma, uncomplicated: Secondary | ICD-10-CM | POA: Diagnosis present

## 2023-02-01 DIAGNOSIS — Z888 Allergy status to other drugs, medicaments and biological substances status: Secondary | ICD-10-CM | POA: Diagnosis not present

## 2023-02-01 DIAGNOSIS — L02511 Cutaneous abscess of right hand: Secondary | ICD-10-CM | POA: Diagnosis present

## 2023-02-01 DIAGNOSIS — F17290 Nicotine dependence, other tobacco product, uncomplicated: Secondary | ICD-10-CM | POA: Diagnosis present

## 2023-02-01 DIAGNOSIS — Z803 Family history of malignant neoplasm of breast: Secondary | ICD-10-CM | POA: Diagnosis not present

## 2023-02-01 DIAGNOSIS — F39 Unspecified mood [affective] disorder: Secondary | ICD-10-CM | POA: Diagnosis present

## 2023-02-01 DIAGNOSIS — F14150 Cocaine abuse with cocaine-induced psychotic disorder with delusions: Secondary | ICD-10-CM

## 2023-02-01 DIAGNOSIS — F411 Generalized anxiety disorder: Secondary | ICD-10-CM | POA: Diagnosis present

## 2023-02-01 LAB — BASIC METABOLIC PANEL
Anion gap: 7 (ref 5–15)
BUN: 9 mg/dL (ref 6–20)
CO2: 23 mmol/L (ref 22–32)
Calcium: 8.5 mg/dL — ABNORMAL LOW (ref 8.9–10.3)
Chloride: 105 mmol/L (ref 98–111)
Creatinine, Ser: 0.87 mg/dL (ref 0.61–1.24)
GFR, Estimated: >60 mL/min (ref >60)
Glucose, Bld: 174 mg/dL — ABNORMAL HIGH (ref 70–99)
Potassium: 3.7 mmol/L (ref 3.5–5.1)
Sodium: 135 mmol/L (ref 135–145)

## 2023-02-01 LAB — CBC
HCT: 37.3 % — ABNORMAL LOW (ref 39.0–52.0)
Hemoglobin: 12 g/dL — ABNORMAL LOW (ref 13.0–17.0)
MCH: 29.1 pg (ref 26.0–34.0)
MCHC: 32.2 g/dL (ref 30.0–36.0)
MCV: 90.5 fL (ref 80.0–100.0)
Platelets: 359 10*3/uL (ref 150–400)
RBC: 4.12 MIL/uL — ABNORMAL LOW (ref 4.22–5.81)
RDW: 14.1 % (ref 11.5–15.5)
WBC: 5.5 10*3/uL (ref 4.0–10.5)
nRBC: 0 % (ref 0.0–0.2)

## 2023-02-01 LAB — AEROBIC/ANAEROBIC CULTURE W GRAM STAIN (SURGICAL/DEEP WOUND)

## 2023-02-01 MED ORDER — POLYETHYLENE GLYCOL 3350 17 G PO PACK: 17.0000 g | PACK | Freq: Every day | ORAL | Status: DC

## 2023-02-01 MED ORDER — OLANZAPINE 5 MG PO TABS
10.0000 mg | ORAL_TABLET | Freq: Every day | ORAL | Status: DC
  Administered 2023-02-01: 10 mg via ORAL
  Filled 2023-02-01: qty 2

## 2023-02-01 MED ORDER — OXYCODONE HCL 5 MG PO TABS
10.0000 mg | ORAL_TABLET | Freq: Four times a day (QID) | ORAL | Status: DC | PRN
  Administered 2023-02-01 – 2023-02-02 (×3): 10 mg via ORAL
  Filled 2023-02-01 (×3): qty 2

## 2023-02-01 MED ORDER — SENNOSIDES-DOCUSATE SODIUM 8.6-50 MG PO TABS
2.0000 | ORAL_TABLET | Freq: Every day | ORAL | Status: DC
  Administered 2023-02-01: 2 via ORAL
  Filled 2023-02-01: qty 2

## 2023-02-01 MED ORDER — VITAMIN C 500 MG PO TABS
1000.0000 mg | ORAL_TABLET | Freq: Every day | ORAL | Status: DC
  Administered 2023-02-01 – 2023-02-02 (×2): 1000 mg via ORAL
  Filled 2023-02-01 (×2): qty 2

## 2023-02-01 MED ORDER — BISACODYL 10 MG RE SUPP: 10.0000 mg | Freq: Every day | RECTAL | Status: DC | PRN

## 2023-02-01 MED ORDER — LACTATED RINGERS IV SOLN: INTRAVENOUS | Status: DC

## 2023-02-01 NOTE — Consult Note (Signed)
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 male patient admitted with finger infection.  HPI:  42 y.o.  male with history of IVDA, substance induced mood disorder, Substance induced anxiety, HTN who presented to the hospital with abscess involving the right index finger. However, psychiatric consult was activated after patient reports paranoia and delusions. He denies prior history of schizophrenia but patient reports visual/auditory hallucinations and paranoia whenever he uses Cocaine. Patient admitted to using Cocaine few days ago which induces his current symptoms. He reports history of taking Lamictal, Depakote and anti-psychotic in the past. Patient is willing to take Olanzapine to address current symptoms.    Past Psychiatric History: as above  Risk to Self:   denies Risk to Others:  denies Prior Inpatient Therapy:  none reported by the patient Prior Outpatient Therapy:  yes  Past Medical History:  Past Medical History:  Diagnosis Date   Anxiety    Asthma    CHF (congestive heart failure) (HCC)    Depression    Exposure to hepatitis B    Exposure to hepatitis C    Hepatitis C    History of kidney stones    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, ending around 2017   Hypertension    IV drug abuse (HCC)    Myocardial infarction (HCC)    Renal disorder    kidney stones   Stroke [facility])     Past Surgical History:   Procedure Laterality Date   APPENDECTOMY     EYE SURGERY     secondary to dog bite   I & D EXTREMITY Right 01/31/2023   Procedure: IRRIGATION AND DEBRIDEMENT INDEX FINGER;  Surgeon: Betha Loa, MD;  Location: [facility] OR;  Service: Orthopedics;  Laterality: Right;   TIBIA FRACTURE SURGERY Right    Family History:  Family History  Problem Relation Age of Onset   Alcohol abuse Father    Melanoma Mother    Breast cancer Mother    Colon cancer Neg Hx    Prostate cancer Neg Hx    Family Psychiatric  History:   Social History:  Social History   Substance and Sexual Activity  Alcohol Use No     Social History   Substance and Sexual Activity  Drug Use Not Currently   Types: IV, Cocaine   Comment: hx of heroin abuse (opioid addiction), crack    Social History   Socioeconomic History   Marital status: Married    Spouse name: Not on file   Number of children: Not on file   Years of education: Not on file   Highest education level: Not on file  Occupational History   Not on file  Tobacco Use   Smoking status: Former   Smokeless tobacco: Never  Vaping Use   Vaping Use: Every day  Substance and Sexual Activity   Alcohol use: No   Drug use: Not Currently    Types: IV, Cocaine  Comment: hx of heroin abuse (opioid addiction), crack   Sexual activity: Not on file  Other Topics Concern   Not on file  Social History Narrative   Working at [employer] in [place].  Married, has 5 step kids (2 at home).  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    Social Determinants of Health   Financial Resource Strain: Not on file  Food Insecurity: Not on file  Transportation Needs: Not on file  Physical Activity: Not on file  Stress: Not on file  Social Connections: Not on file   Additional Social History:    Allergies:   Allergies  Allergen Reactions   Amoxicillin Hives and Other (See Comments)    Did it involve swelling of the face/tongue/throat, SOB, or low BP? No Did it involve sudden  or severe rash/hives, skin peeling, or any reaction on the inside of your mouth or nose? Yes Did you need to seek medical attention at a hospital or doctor's office? Yes When did it last happen?     42 yrs old If all above answers are "NO", may proceed with cephalosporin use.    Fish Allergy Hives   Prednisone Other (See Comments)    irritable    Labs:  Results for orders placed or performed during the hospital encounter of 01/31/23 (from the past 48 hour(s))  CBC with Differential     Status: Abnormal   Collection Time: 01/31/23  9:32 AM  Result Value Ref Range   WBC 5.8 4.0 - 10.5 K/uL   RBC 3.90 (L) 4.22 - 5.81 MIL/uL   Hemoglobin 11.4 (L) 13.0 - 17.0 g/dL   HCT 16.1 (L) 09.6 - 04.5 %   MCV 91.3 80.0 - 100.0 fL   MCH 29.2 26.0 - 34.0 pg   MCHC 32.0 30.0 - 36.0 g/dL   RDW 40.9 81.1 - 91.4 %   Platelets 348 150 - 400 K/uL   nRBC 0.0 0.0 - 0.2 %   Neutrophils Relative % 58 %   Neutro Abs 3.3 1.7 - 7.7 K/uL   Lymphocytes Relative 28 %   Lymphs Abs 1.6 0.7 - 4.0 K/uL   Monocytes Relative 12 %   Monocytes Absolute 0.7 0.1 - 1.0 K/uL   Eosinophils Relative 1 %   Eosinophils Absolute 0.1 0.0 - 0.5 K/uL   Basophils Relative 1 %   Basophils Absolute 0.0 0.0 - 0.1 K/uL   Immature Granulocytes 0 %   Abs Immature Granulocytes 0.02 0.00 - 0.07 K/uL    Comment: Performed at [facility], [address], [place], [place] [address]  Basic metabolic panel     Status: Abnormal   Collection Time: 01/31/23  9:32 AM  Result Value Ref Range   Sodium 136 135 - 145 mmol/L   Potassium 3.1 (L) 3.5 - 5.1 mmol/L   Chloride 103 98 - 111 mmol/L   CO2 23 22 - 32 mmol/L   Glucose, Bld 110 (H) 70 - 99 mg/dL    Comment: Glucose reference range applies only to samples taken after fasting for at least 8 hours.   BUN 8 6 - 20 mg/dL   Creatinine, Ser 6.21 0.61 - 1.24 mg/dL   Calcium 8.4 (L) 8.9 - 10.3 mg/dL   GFR, Estimated >30 >86 mL/min    Comment: (NOTE) Calculated using the CKD-EPI  Creatinine Equation (2021)    Anion gap 10 5 - 15    Comment: Performed at [facility], [address] [place], [place]  [address]  Ethanol     Status: None   Collection Time: 01/31/23  9:32 AM  Result Value Ref Range   Alcohol, Ethyl (B) <10 <10 mg/dL    Comment: (NOTE) Lowest detectable limit for serum alcohol is 10 mg/dL.  For medical purposes only. Performed at [facility], [address], [place], [place] [address]   Aerobic/Anaerobic Culture w Gram Stain (surgical/deep wound)     Status: None (Preliminary result)   Collection Time: 01/31/23  8:46 PM   Specimen: Abscess  Result Value Ref Range   Specimen Description ABSCESS    Special Requests INDEX FINGER    Gram Stain      RARE WBC PRESENT, PREDOMINANTLY PMN RARE GRAM POSITIVE COCCI    Culture      MODERATE GROUP A STREP (S.PYOGENES) ISOLATED Beta hemolytic streptococci are predictably susceptible to penicillin and other beta lactams. Susceptibility testing not routinely performed. Performed at [facility], [address] [place], [place] [address]    Report Status PENDING   CBC     Status: Abnormal   Collection Time: 02/01/23  2:59 AM  Result Value Ref Range   WBC 5.5 4.0 - 10.5 K/uL   RBC 4.12 (L) 4.22 - 5.81 MIL/uL   Hemoglobin 12.0 (L) 13.0 - 17.0 g/dL   HCT 91.4 (L) 78.2 - 95.6 %   MCV 90.5 80.0 - 100.0 fL   MCH 29.1 26.0 - 34.0 pg   MCHC 32.2 30.0 - 36.0 g/dL   RDW 21.3 08.6 - 57.8 %   Platelets 359 150 - 400 K/uL   nRBC 0.0 0.0 - 0.2 %    Comment: Performed at [facility], [address], [place], [place] [address]  Basic metabolic panel     Status: Abnormal   Collection Time: 02/01/23  2:59 AM  Result Value Ref Range   Sodium 135 135 - 145 mmol/L   Potassium 3.7 3.5 - 5.1 mmol/L   Chloride 105 98 - 111 mmol/L   CO2 23 22 - 32 mmol/L   Glucose, Bld 174 (H) 70 - 99 mg/dL    Comment: Glucose reference range applies only to samples taken after fasting for at least 8 hours.    BUN 9 6 - 20 mg/dL   Creatinine, Ser 9.52 0.61 - 1.24 mg/dL   Calcium 8.5 (L) 8.9 - 10.3 mg/dL   GFR, Estimated >84 >13 mL/min    Comment: (NOTE) Calculated using the CKD-EPI Creatinine Equation (2021)    Anion gap 7 5 - 15    Comment: Performed at [facility], [address], [place], [place] [address]    Current Facility-Administered Medications  Medication Dose Route Frequency Provider Last Rate Last Admin   acetaminophen (TYLENOL) tablet 650 mg  650 mg Oral Q6H PRN Betha Loa, MD       Or   acetaminophen (TYLENOL) suppository 650 mg  650 mg Rectal Q6H PRN Betha Loa, MD       albuterol (PROVENTIL) (2.5 MG/3ML) 0.083% nebulizer solution 2.5 mg  2.5 mg Nebulization Q6H PRN Betha Loa, MD       ascorbic acid (VITAMIN C) tablet 1,000 mg  1,000 mg Oral Daily Betha Loa, MD   1,000 mg at 02/01/23 0272   bisacodyl (DULCOLAX) suppository 10 mg  10 mg Rectal Daily PRN Maretta Bees, MD       cefTRIAXone (ROCEPHIN) 2 g in sodium chloride 0.9 % 100 mL IVPB  2  g Intravenous Q24H Betha Loa, MD 200 mL/hr at 02/01/23 1010 2 g at 02/01/23 1010   enoxaparin (LOVENOX) injection 40 mg  40 mg Subcutaneous Q24H Betha Loa, MD       lamoTRIgine (LAMICTAL) tablet 100 mg  100 mg Oral Daily Betha Loa, MD   100 mg at 02/01/23 0819   LORazepam (ATIVAN) injection 0.5 mg  0.5 mg Intravenous Q4H PRN Betha Loa, MD       morphine (PF) 2 MG/ML injection 2 mg  2 mg Intravenous Q2H PRN Betha Loa, MD   2 mg at 02/01/23 0819   OLANZapine (ZYPREXA) tablet 10 mg  10 mg Oral QHS Yara Tomkinson, MD       oxyCODONE (Oxy IR/ROXICODONE) immediate release tablet 10 mg  10 mg Oral Q6H PRN Maretta Bees, MD   10 mg at 02/01/23 1235   polyethylene glycol (MIRALAX / GLYCOLAX) packet 17 g  17 g Oral Daily Ghimire, Werner Lean, MD       senna-docusate (Senokot-S) tablet 2 tablet  2 tablet Oral QHS Ghimire, Werner Lean, MD       sodium chloride flush (NS) 0.9 % injection 3 mL  3 mL Intravenous  Q12H Betha Loa, MD   3 mL at 02/01/23 1610    Musculoskeletal: Strength & Muscle Tone: within normal limits Gait & Station: normal Patient leans: Right   Psychiatric Specialty Exam:  Presentation  General Appearance:  Appropriate for Environment  Eye Contact: Good  Speech: Clear and Coherent  Speech Volume: Normal  Handedness: Right   Mood and Affect  Mood: Euthymic  Affect: Appropriate   Thought Process  Thought Processes: Linear  Descriptions of Associations:Intact  Orientation:Full (Time, Place and Person)  Thought Content:Logical  History of Schizophrenia/Schizoaffective disorder:No data recorded Duration of Psychotic Symptoms:No data recorded Hallucinations:Hallucinations: Auditory; Visual Description of Auditory Hallucinations: hearing voices Description of Visual Hallucinations: seeing things  Ideas of Reference:None  Suicidal Thoughts:Suicidal Thoughts: No  Homicidal Thoughts:Homicidal Thoughts: No   Sensorium  Memory: Immediate Good; Recent Good; Remote Good  Judgment: Intact  Insight: Shallow   Executive Functions  Concentration: Good  Attention Span: Good  Recall: Good  Fund of Knowledge:No data recorded Language: Good   Psychomotor Activity  Psychomotor Activity: Psychomotor Activity: Normal   Assets  Assets: Communication Skills; Desire for Improvement   Sleep  Sleep: Sleep: Fair   Physical Exam: Physical Exam Review of Systems  Psychiatric/Behavioral:  Positive for hallucinations and substance abuse.    Blood pressure 122/83, pulse 81, temperature 98 F (36.7 C), temperature source Oral, resp. rate (!) 24, SpO2 96 %. There is no height or weight on file to calculate BMI.  Treatment Plan Summary: 42 year old male with substance induced psychosis in the context of Cocaine abuse. He denies mood swings, anxiety, homicidal or suicidal thoughts.   Recommendations: -Continue Lamictal 100 mg  daily - Add Olanzapine 10 mg, 1 tablet at bedtime for psychosis/delusions -Patient will benefit from referral to outpatient psychiatrist upon discharge  Disposition: No evidence of imminent risk to self or others at present.   Patient does not meet criteria for psychiatric inpatient admission. Supportive therapy provided about ongoing stressors. Psychiatric service signing out. Re-consult as needed  Thedore Mins, MD 02/01/2023 2:19 PM

## 2023-02-01 NOTE — Progress Notes (Signed)
PROGRESS NOTE        PATIENT DETAILS Name: Randall Parsons Age: 42 y.o. Sex: male Date of Birth: Feb 18, 1981 Admit Date: 01/31/2023 Admitting Physician Clydie Braun, MD [facility]  Brief Summary: Patient is a 42 y.o.  male with history of IVDA, substance induced mood disorder, HTN who presented with abscess involving the right index finger.  Significant events: 4/26>> admit to [facility]-abscess right index finger.  Significant studies: None  Significant microbiology data: 4/26>> hand culture: Moderate group A strep  Procedures: 4/26>> I&D-Dr. Merlyn Lot  Consults: Hand surgery Psychiatry  Subjective: Lying comfortably in bed-denies any chest pain or shortness of breath.  Still with some pain in the right index finger.  Objective: Vitals: Blood pressure 122/69, pulse 78, temperature 97.6 F (36.4 C), resp. rate (!) 24, SpO2 95 %.   Exam: Gen Exam:Alert awake-not in any distress HEENT:atraumatic, normocephalic Chest: B/L clear to auscultation anteriorly CVS:S1S2 regular Abdomen:soft non tender, non distended Extremities:no edema Neurology: Non focal Skin: no rash  Pertinent Labs/Radiology:    Latest Ref Rng & Units 02/01/2023    2:59 AM 01/31/2023    9:32 AM 01/18/2023    4:19 AM  CBC  WBC 4.0 - 10.5 K/uL 5.5  5.8  14.8   Hemoglobin 13.0 - 17.0 g/dL 45.4  09.8  11.9   Hematocrit 39.0 - 52.0 % 37.3  35.6  38.7   Platelets 150 - 400 K/uL 359  348  347     Lab Results  Component Value Date   NA 135 02/01/2023   K 3.7 02/01/2023   CL 105 02/01/2023   CO2 23 02/01/2023      Assessment/Plan: Right index finger soft tissue infection with abscess-s/p I&D on 4/26 IntraOp cultures positive for group A strep Continue Rocephin-stop vancomycin Continues to have severe pain-optimize narcotic regimen-pain control  Paranoid delusions Substance abuse mood disorder Psych consulted  IVDA-cocaine use No signs of  withdrawal As needed benzodiazepines  Chronic HCV Outpatient follow-up with GI/ID clinic.  BMI: Estimated body mass index is 24.37 kg/m as calculated from the following:   Height as of 01/30/23: 5\' 9"  (1.753 m).   Weight as of 01/30/23: 74.8 kg.   Code status:   Code Status: Full Code   DVT Prophylaxis: SCD's Start: 02/01/23 0756 enoxaparin (LOVENOX) injection 40 mg Start: 01/31/23 1915   Family Communication: None at bedside   Disposition Plan: Status is: Observation The patient will require care spanning > 2 midnights and should be moved to inpatient because: Severity of illness   Planned Discharge Destination:Home   Diet: Diet Order             Diet Heart Room service appropriate? Yes; Fluid consistency: Thin; Fluid restriction: 1500 mL Fluid  Diet effective now                     Antimicrobial agents: Anti-infectives (From admission, onward)    Start     Dose/Rate Route Frequency Ordered Stop   01/31/23 2200  vancomycin (VANCOCIN) IVPB 1000 mg/200 mL premix  Status:  Discontinued        1,000 mg 200 mL/hr over 60 Minutes Intravenous Every 12 hours 01/31/23 1005 02/01/23 0953   01/31/23 1000  cefTRIAXone (ROCEPHIN) 2 g in sodium chloride 0.9 % 100 mL IVPB  2 g 200 mL/hr over 30 Minutes Intravenous Every 24 hours 01/31/23 0959     01/31/23 1000  metroNIDAZOLE (FLAGYL) IVPB 500 mg  Status:  Discontinued        500 mg 100 mL/hr over 60 Minutes Intravenous Every 12 hours 01/31/23 0959 02/01/23 0953   01/31/23 0945  vancomycin (VANCOCIN) IVPB 1000 mg/200 mL premix        1,000 mg 200 mL/hr over 60 Minutes Intravenous On call to O.R. 01/31/23 0933 02/01/23 0559   01/31/23 0945  vancomycin (VANCOREADY) IVPB 1500 mg/300 mL        1,500 mg 150 mL/hr over 120 Minutes Intravenous  Once 01/31/23 0936 01/31/23 1341   01/31/23 0730  doxycycline (VIBRA-TABS) tablet 100 mg        100 mg Oral  Once 01/31/23 0726 01/31/23 0739        MEDICATIONS: Scheduled  Meds:  vitamin C  1,000 mg Oral Daily   enoxaparin (LOVENOX) injection  40 mg Subcutaneous Q24H   lamoTRIgine  100 mg Oral Daily   sodium chloride flush  3 mL Intravenous Q12H   Continuous Infusions:  cefTRIAXone (ROCEPHIN)  IV Stopped (01/31/23 1130)   PRN Meds:.acetaminophen **OR** acetaminophen, albuterol, LORazepam, morphine injection, oxyCODONE   I have personally reviewed following labs and imaging studies  LABORATORY DATA: CBC: Recent Labs  Lab 01/31/23 0932 02/01/23 0259  WBC 5.8 5.5  NEUTROABS 3.3  --   HGB 11.4* 12.0*  HCT 35.6* 37.3*  MCV 91.3 90.5  PLT 348 359    Basic Metabolic Panel: Recent Labs  Lab 01/31/23 0932 02/01/23 0259  NA 136 135  K 3.1* 3.7  CL 103 105  CO2 23 23  GLUCOSE 110* 174*  BUN 8 9  CREATININE 0.88 0.87  CALCIUM 8.4* 8.5*    GFR: Estimated Creatinine Clearance: 108.4 mL/min (by C-G formula based on SCr of 0.87 mg/dL).  Liver Function Tests: No results for input(s): "AST", "ALT", "ALKPHOS", "BILITOT", "PROT", "ALBUMIN" in the last 168 hours. No results for input(s): "LIPASE", "AMYLASE" in the last 168 hours. No results for input(s): "AMMONIA" in the last 168 hours.  Coagulation Profile: No results for input(s): "INR", "PROTIME" in the last 168 hours.  Cardiac Enzymes: No results for input(s): "CKTOTAL", "CKMB", "CKMBINDEX", "TROPONINI" in the last 168 hours.  BNP (last 3 results) No results for input(s): "PROBNP" in the last 8760 hours.  Lipid Profile: No results for input(s): "CHOL", "HDL", "LDLCALC", "TRIG", "CHOLHDL", "LDLDIRECT" in the last 72 hours.  Thyroid Function Tests: No results for input(s): "TSH", "T4TOTAL", "FREET4", "T3FREE", "THYROIDAB" in the last 72 hours.  Anemia Panel: No results for input(s): "VITAMINB12", "FOLATE", "FERRITIN", "TIBC", "IRON", "RETICCTPCT" in the last 72 hours.  Urine analysis:    Component Value Date/Time   COLORURINE AMBER (A) 01/10/2023 1249   APPEARANCEUR CLOUDY (A)  01/10/2023 1249   LABSPEC 1.016 01/10/2023 1249   PHURINE 5.0 01/10/2023 1249   GLUCOSEU NEGATIVE 01/10/2023 1249   HGBUR LARGE (A) 01/10/2023 1249   BILIRUBINUR NEGATIVE 01/10/2023 1249   KETONESUR NEGATIVE 01/10/2023 1249   PROTEINUR 100 (A) 01/10/2023 1249   UROBILINOGEN 0.2 11/13/2010 1815   NITRITE NEGATIVE 01/10/2023 1249   LEUKOCYTESUR NEGATIVE 01/10/2023 1249    Sepsis Labs: Lactic Acid, Venous    Component Value Date/Time   LATICACIDVEN 1.8 01/12/2023 1478    MICROBIOLOGY: Recent Results (from the past 240 hour(s))  Aerobic/Anaerobic Culture w Gram Stain (surgical/deep wound)     Status: None (Preliminary result)  Collection Time: 01/31/23  8:46 PM   Specimen: Abscess  Result Value Ref Range Status   Specimen Description ABSCESS  Final   Special Requests INDEX FINGER  Final   Gram Stain   Final    RARE WBC PRESENT, PREDOMINANTLY PMN RARE GRAM POSITIVE COCCI    Culture   Final    MODERATE GROUP A STREP (S.PYOGENES) ISOLATED Beta hemolytic streptococci are predictably susceptible to penicillin and other beta lactams. Susceptibility testing not routinely performed. Performed at [facility], [address], [place], [place] [address]    Report Status PENDING  Incomplete    RADIOLOGY STUDIES/RESULTS: No results found.   LOS: 0 days   Jeoffrey Massed, MD  [facility]    To contact the attending provider between 7A-7P or the covering provider during after hours 7P-7A, please log into the web site [email or web address] and access using universal [place] password for that web site. If you do not have the password, please call the hospital operator.  02/01/2023, 9:53 AM

## 2023-02-01 NOTE — Plan of Care (Signed)
  Problem: Activity: Goal: Ability to tolerate increased activity will improve Outcome: Progressing   Problem: Respiratory: Goal: Ability to maintain a clear airway and adequate ventilation will improve Outcome: Progressing   Problem: Role Relationship: Goal: Method of communication will improve Outcome: Progressing   Problem: Education: Goal: Knowledge of General Education information will improve Description: Including pain rating scale, medication(s)/side effects and non-pharmacologic comfort measures Outcome: Progressing   Problem: Clinical Measurements: Goal: Ability to maintain clinical measurements within normal limits will improve Outcome: Progressing Goal: Will remain free from infection Outcome: Progressing Goal: Diagnostic test results will improve Outcome: Progressing Goal: Respiratory complications will improve Outcome: Progressing Goal: Cardiovascular complication will be avoided Outcome: Progressing   Problem: Activity: Goal: Risk for activity intolerance will decrease Outcome: Progressing   Problem: Nutrition: Goal: Adequate nutrition will be maintained Outcome: Progressing   Problem: Coping: Goal: Level of anxiety will decrease Outcome: Progressing   Problem: Elimination: Goal: Will not experience complications related to bowel motility Outcome: Progressing Goal: Will not experience complications related to urinary retention Outcome: Progressing   Problem: Pain Managment: Goal: General experience of comfort will improve Outcome: Progressing   Problem: Skin Integrity: Goal: Risk for impaired skin integrity will decrease Outcome: Progressing   Problem: Education: Goal: Required Educational Video(s) Outcome: Progressing   Problem: Clinical Measurements: Goal: Ability to maintain clinical measurements within normal limits will improve Outcome: Progressing Goal: Postoperative complications will be avoided or minimized Outcome: Progressing

## 2023-02-01 NOTE — Progress Notes (Signed)
Subjective: 1 Day Post-Op Procedure(s) (LRB): IRRIGATION AND DEBRIDEMENT INDEX FINGER (Right) Sitting up in chair.  Patient reports pain in index finger.  Different from immediate post op.  Objective: Vital signs in last 24 hours: Temp:  [97.6 F (36.4 C)-98.5 F (36.9 C)] 98 F (36.7 C) (04/27 1212) Pulse Rate:  [59-81] 81 (04/27 1212) Resp:  [16-24] 24 (04/27 0800) BP: (100-122)/(57-83) 122/83 (04/27 1212) SpO2:  [94 %-99 %] 96 % (04/27 1212)  Intake/Output from previous day: 04/26 0701 - 04/27 0700 In: 1250 [I.V.:1000; IV Piggyback:250] Out: 10 [Blood:10] Intake/Output this shift: Total I/O In: 720 [P.O.:720] Out: -   Recent Labs    01/31/23 0932 02/01/23 0259  HGB 11.4* 12.0*   Recent Labs    01/31/23 0932 02/01/23 0259  WBC 5.8 5.5  RBC 3.90* 4.12*  HCT 35.6* 37.3*  PLT 348 359   Recent Labs    01/31/23 0932 02/01/23 0259  NA 136 135  K 3.1* 3.7  CL 103 105  CO2 23 23  BUN 8 9  CREATININE 0.88 0.87  GLUCOSE 110* 174*  CALCIUM 8.4* 8.5*   No results for input(s): "LABPT", "INR" in the last 72 hours.  Finger feel different, but is able to feel it.  Intact capillary refill.  Dressing clean/dry/intact.   Assessment/Plan: 1 Day Post-Op Procedure(s) (LRB): IRRIGATION AND DEBRIDEMENT INDEX FINGER (Right) Doing well overall.  WBC normal.  Start hydrotherapy Monday or Tuesday.  Okay for d/c from hand standpoint when pain controlled and oral antibiotics selected and secured for patient.  Can start hydrotherapy in office.   Betha Loa 02/01/2023, 2:57 PM

## 2023-02-01 NOTE — Progress Notes (Signed)
Formatting of this note is different from the original.  Subjective:  1 Day Post-Op Procedure(s) (LRB):  IRRIGATION AND DEBRIDEMENT INDEX FINGER (Right)  Sitting up in chair.  Patient reports pain in index finger.  Different from immediate post op.    Objective:  Vital signs in last 24 hours:  Temp:  [97.6 F (36.4 C)-98.5 F (36.9 C)] 98 F (36.7 C) (04/27 1212)  Pulse Rate:  [59-81] 81 (04/27 1212)  Resp:  [16-24] 24 (04/27 0800)  BP: (100-122)/(57-83) 122/83 (04/27 1212)  SpO2:  [94 %-99 %] 96 % (04/27 1212)    Intake/Output from previous day:  04/26 0701 - 04/27 0700  In: 1250 [I.V.:1000; IV Piggyback:250]  Out: 10 [Blood:10]  Intake/Output this shift:  Total I/O  In: 720 [P.O.:720]  Out: -     Recent Labs     01/31/23  0932 02/01/23  0259   HGB 11.4* 12.0*     Recent Labs     01/31/23  0932 02/01/23  0259   WBC 5.8 5.5   RBC 3.90* 4.12*   HCT 35.6* 37.3*   PLT 348 359     Recent Labs     01/31/23  0932 02/01/23  0259   NA 136 135   K 3.1* 3.7   CL 103 105   CO2 23 23   BUN 8 9   CREATININE 0.88 0.87   GLUCOSE 110* 174*   CALCIUM 8.4* 8.5*     No results for input(s): "LABPT", "INR" in the last 72 hours.    Finger feel different, but is able to feel it.  Intact capillary refill.  Dressing clean/dry/intact.    Assessment/Plan:  1 Day Post-Op Procedure(s) (LRB):  IRRIGATION AND DEBRIDEMENT INDEX FINGER (Right)  Doing well overall.  WBC normal.  Start hydrotherapy Monday or Tuesday.  Okay for d/c from hand standpoint when pain controlled and oral antibiotics selected and secured for patient.  Can start hydrotherapy in office.    Betha Loa  02/01/2023, 2:57 PM    Electronically signed by Betha Loa, MD at 02/01/2023  2:59 PM EDT

## 2023-02-01 NOTE — Progress Notes (Signed)
Formatting of this note is different from the original.  Images from the original note were not included.                       PROGRESS NOTE      PATIENT DETAILS  Name: Lucas Hicks  Age: 42 y.o.  Sex: male  Date of Birth: 09/15/1981  Admit Date: 01/31/2023  Admitting Physician Clydie Braun, MD  [facility]    Brief Summary:  Patient is a 42 y.o.  male with history of IVDA, substance induced mood disorder, HTN who presented with abscess involving the right index finger.    Significant events:  4/26>> admit to [facility]-abscess right index finger.    Significant studies:  None    Significant microbiology data:  4/26>> hand culture: Moderate group A strep    Procedures:  4/26>> I&D-Dr. Merlyn Lot    Consults:  Hand surgery  Psychiatry    Subjective:  Lying comfortably in bed-denies any chest pain or shortness of breath.  Still with some pain in the right index finger.    Objective:  Vitals:  Blood pressure 122/69, pulse 78, temperature 97.6 F (36.4 C), resp. rate (!) 24, SpO2 95 %.     Exam:  Gen Exam:Alert awake-not in any distress  HEENT:atraumatic, normocephalic  Chest: B/L clear to auscultation anteriorly  CVS:S1S2 regular  Abdomen:soft non tender, non distended  Extremities:no edema  Neurology: Non focal  Skin: no rash    Pertinent Labs/Radiology:      Latest Ref Rng & Units 02/01/2023     2:59 AM 01/31/2023     9:32 AM 01/18/2023     4:19 AM   CBC   WBC 4.0 - 10.5 K/uL 5.5  5.8  14.8    Hemoglobin 13.0 - 17.0 g/dL 45.4  09.8  11.9    Hematocrit 39.0 - 52.0 % 37.3  35.6  38.7    Platelets 150 - 400 K/uL 359  348  347      Lab Results   Component Value Date    NA 135 02/01/2023    K 3.7 02/01/2023    CL 105 02/01/2023    CO2 23 02/01/2023       Assessment/Plan:  Right index finger soft tissue infection with abscess-s/p I&D on 4/26  IntraOp cultures positive for group A strep  Continue Rocephin-stop vancomycin  Continues to have severe pain-optimize narcotic regimen-pain control    Paranoid  delusions  Substance abuse mood disorder  Psych consulted    IVDA-cocaine use  No signs of withdrawal  As needed benzodiazepines    Chronic HCV  Outpatient follow-up with GI/ID clinic.    BMI:  Estimated body mass index is 24.37 kg/m as calculated from the following:    Height as of 01/30/23: 5\' 9"  (1.753 m).    Weight as of 01/30/23: 74.8 kg.     Code status:    Code Status: Full Code     DVT Prophylaxis:  SCD's Start: 02/01/23 0756  enoxaparin (LOVENOX) injection 40 mg Start: 01/31/23 1915    Family Communication: None at bedside    Disposition Plan:  Status is: Observation  The patient will require care spanning > 2 midnights and should be moved to inpatient because: Severity of illness    Planned Discharge Destination:Home    Diet:  Diet Order          Diet Heart Room service appropriate? Yes; Fluid consistency: Thin; Fluid  restriction: 1500 mL Fluid  Diet effective now                        Antimicrobial agents:  Anti-infectives (From admission, onward)      Start     Dose/Rate Route Frequency Ordered Stop    01/31/23 2200  vancomycin (VANCOCIN) IVPB 1000 mg/200 mL premix  Status:  Discontinued         1,000 mg  200 mL/hr over 60 Minutes Intravenous Every 12 hours 01/31/23 1005 02/01/23 0953    01/31/23 1000  cefTRIAXone (ROCEPHIN) 2 g in sodium chloride 0.9 % 100 mL IVPB         2 g  200 mL/hr over 30 Minutes Intravenous Every 24 hours 01/31/23 0959      01/31/23 1000  metroNIDAZOLE (FLAGYL) IVPB 500 mg  Status:  Discontinued         500 mg  100 mL/hr over 60 Minutes Intravenous Every 12 hours 01/31/23 0959 02/01/23 0953    01/31/23 0945  vancomycin (VANCOCIN) IVPB 1000 mg/200 mL premix         1,000 mg  200 mL/hr over 60 Minutes Intravenous On call to O.R. 01/31/23 0933 02/01/23 0559    01/31/23 0945  vancomycin (VANCOREADY) IVPB 1500 mg/300 mL         1,500 mg  150 mL/hr over 120 Minutes Intravenous  Once 01/31/23 0936 01/31/23 1341    01/31/23 0730  doxycycline (VIBRA-TABS) tablet 100 mg         100 mg Oral   Once 01/31/23 0726 01/31/23 0739         MEDICATIONS:  Scheduled Meds:   vitamin C  1,000 mg Oral Daily    enoxaparin (LOVENOX) injection  40 mg Subcutaneous Q24H    lamoTRIgine  100 mg Oral Daily    sodium chloride flush  3 mL Intravenous Q12H     Continuous Infusions:   cefTRIAXone (ROCEPHIN)  IV Stopped (01/31/23 1130)     PRN Meds:.acetaminophen **OR** acetaminophen, albuterol, LORazepam, morphine injection, oxyCODONE    I have personally reviewed following labs and imaging studies    LABORATORY DATA:  CBC:  Recent Labs   Lab 01/31/23  0932 02/01/23  0259   WBC 5.8 5.5   NEUTROABS 3.3  --    HGB 11.4* 12.0*   HCT 35.6* 37.3*   MCV 91.3 90.5   PLT 348 359     Basic Metabolic Panel:  Recent Labs   Lab 01/31/23  0932 02/01/23  0259   NA 136 135   K 3.1* 3.7   CL 103 105   CO2 23 23   GLUCOSE 110* 174*   BUN 8 9   CREATININE 0.88 0.87   CALCIUM 8.4* 8.5*     GFR:  Estimated Creatinine Clearance: 108.4 mL/min (by C-G formula based on SCr of 0.87 mg/dL).    Liver Function Tests:  No results for input(s): "AST", "ALT", "ALKPHOS", "BILITOT", "PROT", "ALBUMIN" in the last 168 hours.  No results for input(s): "LIPASE", "AMYLASE" in the last 168 hours.  No results for input(s): "AMMONIA" in the last 168 hours.    Coagulation Profile:  No results for input(s): "INR", "PROTIME" in the last 168 hours.    Cardiac Enzymes:  No results for input(s): "CKTOTAL", "CKMB", "CKMBINDEX", "TROPONINI" in the last 168 hours.    BNP (last 3 results)  No results for input(s): "PROBNP" in the last 8760 hours.  Lipid Profile:  No results for input(s): "CHOL", "HDL", "LDLCALC", "TRIG", "CHOLHDL", "LDLDIRECT" in the last 72 hours.    Thyroid Function Tests:  No results for input(s): "TSH", "T4TOTAL", "FREET4", "T3FREE", "THYROIDAB" in the last 72 hours.    Anemia Panel:  No results for input(s): "VITAMINB12", "FOLATE", "FERRITIN", "TIBC", "IRON", "RETICCTPCT" in the last 72 hours.    Urine analysis:    Component Value Date/Time    COLORURINE  AMBER (A) 01/10/2023 1249    APPEARANCEUR CLOUDY (A) 01/10/2023 1249    LABSPEC 1.016 01/10/2023 1249    PHURINE 5.0 01/10/2023 1249    GLUCOSEU NEGATIVE 01/10/2023 1249    HGBUR LARGE (A) 01/10/2023 1249    BILIRUBINUR NEGATIVE 01/10/2023 1249    KETONESUR NEGATIVE 01/10/2023 1249    PROTEINUR 100 (A) 01/10/2023 1249    UROBILINOGEN 0.2 11/13/2010 1815    NITRITE NEGATIVE 01/10/2023 1249    LEUKOCYTESUR NEGATIVE 01/10/2023 1249     Sepsis Labs:  Lactic Acid, Venous    Component Value Date/Time    LATICACIDVEN 1.8 01/12/2023 1610     MICROBIOLOGY:  Recent Results (from the past 240 hour(s))   Aerobic/Anaerobic Culture w Gram Stain (surgical/deep wound)     Status: None (Preliminary result)    Collection Time: 01/31/23  8:46 PM    Specimen: Abscess   Result Value Ref Range Status    Specimen Description ABSCESS  Final    Special Requests INDEX FINGER  Final    Gram Stain   Final     RARE WBC PRESENT, PREDOMINANTLY PMN  RARE GRAM POSITIVE COCCI     Culture   Final     MODERATE GROUP A STREP (S.PYOGENES) ISOLATED  Beta hemolytic streptococci are predictably susceptible to penicillin and other beta lactams. Susceptibility testing not routinely performed.  Performed at [facility], [address] [place], [place] [address]     Report Status PENDING  Incomplete     RADIOLOGY STUDIES/RESULTS:  No results found.     LOS: 0 days     Jeoffrey Massed, MD   [facility]    To contact the attending provider between 7A-7P or the covering provider during after hours 7P-7A, please log into the web site [email or web address] and access using universal [facility] password for that web site. If you do not have the password, please call the hospital operator.    02/01/2023, 9:53 AM      Electronically signed by Maretta Bees, MD at 02/01/2023  9:53 AM EDT

## 2023-02-01 NOTE — Unmapped (Signed)
Formatting of this note might be different from the original.    Problem: Activity:  Goal: Ability to tolerate increased activity will improve  Outcome: Progressing    Problem: Respiratory:  Goal: Ability to maintain a clear airway and adequate ventilation will improve  Outcome: Progressing    Problem: Role Relationship:  Goal: Method of communication will improve  Outcome: Progressing    Problem: Education:  Goal: Knowledge of General Education information will improve  Description: Including pain rating scale, medication(s)/side effects and non-pharmacologic comfort measures  Outcome: Progressing    Problem: Clinical Measurements:  Goal: Ability to maintain clinical measurements within normal limits will improve  Outcome: Progressing  Goal: Will remain free from infection  Outcome: Progressing  Goal: Diagnostic test results will improve  Outcome: Progressing  Goal: Respiratory complications will improve  Outcome: Progressing  Goal: Cardiovascular complication will be avoided  Outcome: Progressing    Problem: Activity:  Goal: Risk for activity intolerance will decrease  Outcome: Progressing    Problem: Nutrition:  Goal: Adequate nutrition will be maintained  Outcome: Progressing    Problem: Coping:  Goal: Level of anxiety will decrease  Outcome: Progressing    Problem: Elimination:  Goal: Will not experience complications related to bowel motility  Outcome: Progressing  Goal: Will not experience complications related to urinary retention  Outcome: Progressing    Problem: Pain Managment:  Goal: General experience of comfort will improve  Outcome: Progressing    Problem: Skin Integrity:  Goal: Risk for impaired skin integrity will decrease  Outcome: Progressing    Problem: Education:  Goal: Required Educational Video(s)  Outcome: Progressing    Problem: Clinical Measurements:  Goal: Ability to maintain clinical measurements within normal limits will improve  Outcome: Progressing  Goal: Postoperative complications  will be avoided or minimized  Outcome: Progressing    Electronically signed by Carylon Perches, LPN at 16/07/9603 12:58 AM EDT

## 2023-02-01 NOTE — Unmapped (Signed)
Formatting of this note is different from the original.  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 male patient admitted with finger infection.    HPI:  42 y.o.  male with history of IVDA, substance induced mood disorder, Substance induced anxiety, HTN who presented to the hospital with abscess involving the right index finger. However, psychiatric consult was activated after patient reports paranoia and delusions. He denies prior history of schizophrenia but patient reports visual/auditory hallucinations and paranoia whenever he uses Cocaine. Patient admitted to using Cocaine few days ago which induces his current symptoms. He reports history of taking Lamictal, Depakote and anti-psychotic in the past. Patient is willing to take Olanzapine to address current symptoms.      Past Psychiatric History: as above    Risk to Self:   denies  Risk to Others:  denies  Prior Inpatient Therapy:  none reported by the patient  Prior Outpatient Therapy:  yes    Past Medical History:   Past Medical History:   Diagnosis Date    Anxiety     Asthma     CHF (congestive heart failure) (HCC)     Depression     Exposure to hepatitis B     Exposure to hepatitis C     Hepatitis C     History of kidney stones     History of opioid abuse (HCC)     reports heroin abuse, ending around 2017    Hypertension     IV drug abuse (HCC)     Myocardial infarction (HCC)     Renal disorder     kidney  stones    Stroke [facility])      Past Surgical History:   Procedure Laterality Date    APPENDECTOMY      EYE SURGERY      secondary to dog bite    I & D EXTREMITY Right 01/31/2023    Procedure: IRRIGATION AND DEBRIDEMENT INDEX FINGER;  Surgeon: Betha Loa, MD;  Location: [facility] OR;  Service: Orthopedics;  Laterality: Right;    TIBIA FRACTURE SURGERY Right      Family History:   Family History   Problem Relation Age of Onset    Alcohol abuse Father     Melanoma Mother     Breast cancer Mother     Colon cancer Neg Hx     Prostate cancer Neg Hx      Family Psychiatric  History:     Social History:   Social History     Substance and Sexual Activity   Alcohol Use No     Social History     Substance and Sexual Activity   Drug Use Not Currently    Types: IV, Cocaine    Comment: hx of heroin abuse (opioid addiction), crack     Social History     Socioeconomic  History    Marital status: Married     Spouse name: Not on file    Number of children: Not on file    Years of education: Not on file    Highest education level: Not on file   Occupational History    Not on file   Tobacco Use    Smoking status: Former    Smokeless tobacco: Never   Vaping Use    Vaping Use: Every day   Substance and Sexual Activity    Alcohol use: No    Drug use: Not Currently     Types: IV, Cocaine     Comment: hx of heroin abuse (opioid addiction), crack    Sexual activity: Not on file   Other Topics Concern    Not on file   Social History Narrative    Working at [employer] in [place].  Married, has 5 step kids (2 at home).  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      Social Determinants of Health     Financial Resource Strain: Not on file   Food Insecurity: Not on file   Transportation Needs: Not on file   Physical Activity: Not on file   Stress: Not on file   Social Connections: Not on file     Additional Social History:      Allergies:    Allergies   Allergen Reactions    Amoxicillin Hives and Other (See Comments)     Did it involve swelling of the  face/tongue/throat, SOB, or low BP? No  Did it involve sudden or severe rash/hives, skin peeling, or any reaction on the inside of your mouth or nose? Yes  Did you need to seek medical attention at a hospital or doctor's office? Yes  When did it last happen?     43 yrs old  If all above answers are "NO", may proceed with cephalosporin use.     Fish Allergy Hives    Prednisone Other (See Comments)     irritable     Labs:   Results for orders placed or performed during the hospital encounter of 01/31/23 (from the past 48 hour(s))   CBC with Differential     Status: Abnormal    Collection Time: 01/31/23  9:32 AM   Result Value Ref Range    WBC 5.8 4.0 - 10.5 K/uL    RBC 3.90 (L) 4.22 - 5.81 MIL/uL    Hemoglobin 11.4 (L) 13.0 - 17.0 g/dL    HCT 35.5 (L) 73.2 - 52.0 %    MCV 91.3 80.0 - 100.0 fL    MCH 29.2 26.0 - 34.0 pg    MCHC 32.0 30.0 - 36.0 g/dL    RDW 20.2 54.2 - 70.6 %    Platelets 348 150 - 400 K/uL    nRBC 0.0 0.0 - 0.2 %    Neutrophils Relative % 58 %    Neutro Abs 3.3 1.7 - 7.7 K/uL    Lymphocytes Relative 28 %    Lymphs Abs 1.6 0.7 - 4.0 K/uL    Monocytes Relative 12 %    Monocytes Absolute 0.7 0.1 - 1.0 K/uL    Eosinophils Relative 1 %    Eosinophils Absolute 0.1 0.0 - 0.5 K/uL    Basophils Relative 1 %    Basophils Absolute 0.0 0.0 - 0.1 K/uL    Immature Granulocytes 0 %    Abs Immature Granulocytes 0.02 0.00 - 0.07 K/uL     Comment: Performed at  [facility], [address], [place], [place] [address]   Basic metabolic panel     Status: Abnormal    Collection Time: 01/31/23  9:32 AM   Result Value Ref Range    Sodium 136 135 - 145 mmol/L    Potassium 3.1 (L) 3.5 - 5.1 mmol/L    Chloride 103 98 - 111 mmol/L    CO2 23 22 - 32 mmol/L    Glucose, Bld 110 (H) 70 - 99 mg/dL     Comment: Glucose reference range applies only to samples taken after fasting for at least 8 hours.    BUN 8 6 - 20 mg/dL    Creatinine, Ser 6.64 0.61 - 1.24 mg/dL    Calcium 8.4 (L) 8.9 - 10.3 mg/dL    GFR, Estimated >40 >34 mL/min      Comment: (NOTE)  Calculated using the CKD-EPI Creatinine Equation (2021)     Anion gap 10 5 - 15     Comment: Performed at [facility], [address], [place], [place] [address]   Ethanol     Status: None    Collection Time: 01/31/23  9:32 AM   Result Value Ref Range    Alcohol, Ethyl (B) <10 <10 mg/dL     Comment: (NOTE)  Lowest detectable limit for serum alcohol is 10 mg/dL.    For medical purposes only.  Performed at [facility], [address] [place], [place]  [address]    Aerobic/Anaerobic Culture w Gram Stain (surgical/deep wound)     Status: None (Preliminary result)    Collection Time: 01/31/23  8:46 PM    Specimen: Abscess   Result Value Ref Range    Specimen Description ABSCESS     Special Requests INDEX FINGER     Gram Stain       RARE WBC PRESENT, PREDOMINANTLY PMN  RARE GRAM POSITIVE COCCI     Culture       MODERATE GROUP A STREP (S.PYOGENES) ISOLATED  Beta hemolytic streptococci are predictably susceptible to penicillin and other beta lactams. Susceptibility testing not routinely performed.  Performed at [facility], [address], [place], [place] [address]     Report Status PENDING    CBC     Status: Abnormal    Collection Time: 02/01/23  2:59 AM   Result Value Ref Range    WBC 5.5 4.0 - 10.5 K/uL    RBC 4.12 (L) 4.22 - 5.81 MIL/uL    Hemoglobin 12.0 (L) 13.0 - 17.0 g/dL    HCT 29.5 (L) 18.8 - 52.0 %    MCV 90.5 80.0 - 100.0 fL    MCH 29.1 26.0 - 34.0 pg    MCHC 32.2 30.0 - 36.0 g/dL    RDW 41.6 60.6 - 30.1 %    Platelets 359 150 - 400 K/uL    nRBC 0.0 0.0 - 0.2 %     Comment: Performed at [facility], [address], [place], [place] [address]   Basic metabolic panel     Status: Abnormal    Collection Time: 02/01/23  2:59 AM   Result Value Ref Range    Sodium 135 135 - 145 mmol/L    Potassium 3.7 3.5 - 5.1 mmol/L    Chloride 105 98 - 111 mmol/L    CO2 23 22 - 32 mmol/L    Glucose, Bld 174 (H) 70 - 99 mg/dL     Comment:  Glucose reference range applies only to  samples taken after fasting for at least 8 hours.    BUN 9 6 - 20 mg/dL    Creatinine, Ser 1.61 0.61 - 1.24 mg/dL    Calcium 8.5 (L) 8.9 - 10.3 mg/dL    GFR, Estimated >09 >60 mL/min     Comment: (NOTE)  Calculated using the CKD-EPI Creatinine Equation (2021)     Anion gap 7 5 - 15     Comment: Performed at [facility], [address] [place], [place] [address]     Current Facility-Administered Medications   Medication Dose Route Frequency Provider Last Rate Last Admin    acetaminophen (TYLENOL) tablet 650 mg  650 mg Oral Q6H PRN Betha Loa, MD        Or    acetaminophen (TYLENOL) suppository 650 mg  650 mg Rectal Q6H PRN Betha Loa, MD        albuterol (PROVENTIL) (2.5 MG/3ML) 0.083% nebulizer solution 2.5 mg  2.5 mg Nebulization Q6H PRN Betha Loa, MD        ascorbic acid (VITAMIN C) tablet 1,000 mg  1,000 mg Oral Daily Betha Loa, MD   1,000 mg at 02/01/23 8119    bisacodyl (DULCOLAX) suppository 10 mg  10 mg Rectal Daily PRN Maretta Bees, MD        cefTRIAXone (ROCEPHIN) 2 g in sodium chloride 0.9 % 100 mL IVPB  2 g Intravenous Q24H Betha Loa, MD 200 mL/hr at 02/01/23 1010 2 g at 02/01/23 1010    enoxaparin (LOVENOX) injection 40 mg  40 mg Subcutaneous Q24H Betha Loa, MD        lamoTRIgine (LAMICTAL) tablet 100 mg  100 mg Oral Daily Betha Loa, MD   100 mg at 02/01/23 0819    LORazepam (ATIVAN) injection 0.5 mg  0.5 mg Intravenous Q4H PRN Betha Loa, MD        morphine (PF) 2 MG/ML injection 2 mg  2 mg Intravenous Q2H PRN Betha Loa, MD   2 mg at 02/01/23 0819    OLANZapine (ZYPREXA) tablet 10 mg  10 mg Oral QHS Akintayo, Mojeed, MD        oxyCODONE (Oxy IR/ROXICODONE) immediate release tablet 10 mg  10 mg Oral Q6H PRN Maretta Bees, MD   10 mg at 02/01/23 1235    polyethylene glycol (MIRALAX / GLYCOLAX) packet 17 g  17 g Oral Daily Ghimire, Werner Lean, MD        senna-docusate (Senokot-S) tablet 2 tablet  2 tablet Oral QHS Ghimire, Werner Lean, MD        sodium chloride flush  (NS) 0.9 % injection 3 mL  3 mL Intravenous Q12H Betha Loa, MD   3 mL at 02/01/23 1478     Musculoskeletal:  Strength & Muscle Tone: within normal limits  Gait & Station: normal  Patient leans: Right    Psychiatric Specialty Exam:    Presentation   General Appearance:   Appropriate for Environment    Eye Contact:  Good    Speech:  Clear and Coherent    Speech Volume:  Normal    Handedness:  Right    Mood and Affect   Mood:  Euthymic    Affect:  Appropriate    Thought Process   Thought Processes:  Linear    Descriptions of Associations:Intact    Orientation:Full (Time, Place and Person)    Thought Content:Logical    History of Schizophrenia/Schizoaffective disorder:No data recorded  Duration of Psychotic Symptoms:No data recorded  Hallucinations:Hallucinations: Auditory; Visual  Description of Auditory Hallucinations: hearing voices  Description of Visual Hallucinations: seeing things    Ideas of Reference:None    Suicidal Thoughts:Suicidal Thoughts: No    Homicidal Thoughts:Homicidal Thoughts: No    Sensorium   Memory:  Immediate Good; Recent Good; Remote Good    Judgment:  Intact    Insight:  Shallow    Executive Functions   Concentration:  Good    Attention Span:  Good    Recall:  Good    Fund of Knowledge:No data recorded  Language:  Good    Psychomotor Activity   Psychomotor Activity:  Psychomotor Activity: Normal    Assets   Assets:  Communication Skills; Desire for Improvement    Sleep   Sleep:  Sleep: Fair    Physical Exam:  Physical Exam  Review of Systems   Psychiatric/Behavioral:  Positive for hallucinations and substance abuse.      Blood pressure 122/83, pulse 81, temperature 98 F (36.7 C), temperature source Oral, resp. rate (!) 24, SpO2 96 %. There is no height or weight on file to calculate BMI.    Treatment Plan Summary:  42 year old male with substance induced psychosis in the context of Cocaine abuse. He denies mood swings, anxiety, homicidal or suicidal thoughts.      Recommendations:  -Continue Lamictal 100 mg daily  - Add Olanzapine 10 mg, 1 tablet at bedtime for psychosis/delusions  -Patient will benefit from referral to outpatient psychiatrist upon discharge    Disposition: No evidence of imminent risk to self or others at present.    Patient does not meet criteria for psychiatric inpatient admission.  Supportive therapy provided about ongoing stressors.  Psychiatric service signing out. Re-consult as needed    Thedore Mins, MD  02/01/2023 2:19 PM    Electronically signed by Thedore Mins, MD at 02/01/2023  2:40 PM EDT

## 2023-02-02 DIAGNOSIS — F22 Delusional disorders: Secondary | ICD-10-CM | POA: Diagnosis not present

## 2023-02-02 DIAGNOSIS — L02511 Cutaneous abscess of right hand: Secondary | ICD-10-CM | POA: Diagnosis not present

## 2023-02-02 DIAGNOSIS — Z8619 Personal history of other infectious and parasitic diseases: Secondary | ICD-10-CM | POA: Diagnosis not present

## 2023-02-02 LAB — CBC
HCT: 33.5 % — ABNORMAL LOW (ref 39.0–52.0)
Hemoglobin: 10.8 g/dL — ABNORMAL LOW (ref 13.0–17.0)
MCH: 29.4 pg (ref 26.0–34.0)
MCHC: 32.2 g/dL (ref 30.0–36.0)
MCV: 91.3 fL (ref 80.0–100.0)
Platelets: 314 10*3/uL (ref 150–400)
RBC: 3.67 MIL/uL — ABNORMAL LOW (ref 4.22–5.81)
RDW: 14.5 % (ref 11.5–15.5)
WBC: 6.1 10*3/uL (ref 4.0–10.5)
nRBC: 0 % (ref 0.0–0.2)

## 2023-02-02 LAB — AEROBIC/ANAEROBIC CULTURE W GRAM STAIN (SURGICAL/DEEP WOUND)

## 2023-02-02 MED ORDER — ALBUTEROL SULFATE HFA 108 (90 BASE) MCG/ACT IN AERS: 2.0000 | INHALATION_SPRAY | Freq: Four times a day (QID) | RESPIRATORY_TRACT | 0 refills | Status: DC | PRN

## 2023-02-02 MED ORDER — POLYETHYLENE GLYCOL 3350 17 G PO PACK: 17.0000 g | PACK | Freq: Every day | ORAL | 0 refills | Status: DC

## 2023-02-02 MED ORDER — CEFADROXIL 500 MG PO CAPS
1000.0000 mg | ORAL_CAPSULE | Freq: Every day | ORAL | Status: DC
  Administered 2023-02-02: 1000 mg via ORAL
  Filled 2023-02-02: qty 2

## 2023-02-02 MED ORDER — OLANZAPINE 10 MG PO TABS: 10.0000 mg | ORAL_TABLET | Freq: Every day | ORAL | 0 refills | Status: DC

## 2023-02-02 MED ORDER — CEFADROXIL 500 MG PO CAPS: 1000.0000 mg | ORAL_CAPSULE | Freq: Every day | ORAL | 0 refills | Status: DC

## 2023-02-02 MED ORDER — LAMOTRIGINE 200 MG PO TABS: 200.0000 mg | ORAL_TABLET | Freq: Every day | ORAL | 0 refills | Status: DC

## 2023-02-02 MED ORDER — CEFADROXIL 500 MG PO CAPS
1000.0000 mg | ORAL_CAPSULE | Freq: Every day | ORAL | Status: DC
  Filled 2023-02-02 (×2): qty 2

## 2023-02-02 MED ORDER — OXYCODONE HCL 5 MG PO TABS: 5.0000 mg | ORAL_TABLET | Freq: Four times a day (QID) | ORAL | 0 refills | Status: DC | PRN

## 2023-02-02 MED ORDER — CEFADROXIL 500 MG PO CAPS: 1000.0000 mg | ORAL_CAPSULE | Freq: Every day | ORAL | 0 refills | Status: AC

## 2023-02-02 NOTE — Discharge Summary (Addendum)
PATIENT DETAILS Name: Randall Parsons Age: 42 y.o. Sex: male Date of Birth: [birth year] MRN: [id number]. Admitting Physician: Clydie Braun, MD [id number]  Admit Date: 01/31/2023 Discharge date: 02/02/2023  Recommendations for Outpatient Follow-up:  Follow up with PCP in 1-2 weeks Please obtain CMP/CBC in one week Please ensure follow-up with orthopedics and psychiatry.  Admitted From:  Home  Disposition: Home   Discharge Condition: good  CODE STATUS:   Code Status: Full Code   Diet recommendation:  Diet Order             Diet general           Diet Heart Room service appropriate? Yes; Fluid consistency: Thin; Fluid restriction: 1500 mL Fluid  Diet effective now                    Brief Summary: Patient is a 42 y.o.  male with history of IVDA, substance induced mood disorder, HTN who presented with abscess involving the right index finger.   Significant events: 4/26>> admit to [facility]-abscess right index finger.   Significant studies: None   Significant microbiology data: 4/26>> hand culture: Moderate group A strep   Procedures: 4/26>> I&D-Dr. Merlyn Lot   Consults: Hand surgery Psychiatry    Brief Hospital Course: Right index finger soft tissue infection with abscess-s/p I&D on 4/26 IntraOp cultures positive for group A strep Initially on vancomycin/Rocephin-has been switched to oral antibiotics Pain controlled with oral narcotics Discussed with Dr. Merlyn Lot over the phone-okay to discharge-his office will arrange follow-up for hydrotherapy.     Paranoid delusions Substance abuse mood disorder Psych consulted-started on Zyprexa-continued on Lamictal. Follow-up with outpatient psychiatry   IVDA-cocaine use No signs of withdrawal As needed benzodiazepines   Chronic HCV Outpatient follow-up with GI/ID clinic as previously planned-see prior notes.   BMI: Estimated body mass index is 24.37 kg/m as calculated from the  following:   Height as of 01/30/23: 5\' 9"  (1.753 m).   Weight as of 01/30/23: 74.8 kg.   BMI: Estimated body mass index is 24.37 kg/m as calculated from the following:   Height as of 01/30/23: 5\' 9"  (1.753 m).   Weight as of 01/30/23: 74.8 kg.    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 in 1-2 weeks  Please get a complete blood count and chemistry panel checked by your Primary MD at your next visit, and again as instructed by your Primary MD.  Get Medicines reviewed and adjusted: Please take all your medications with you for your next visit with your Primary MD  Laboratory/radiological data: Please request your Primary MD to go over all hospital tests and procedure/radiological results at the follow up, please ask your Primary MD to get all Hospital records sent to his/her  office.  In some cases, they will be blood work, cultures and biopsy results pending at the time of your discharge. Please request that your primary care M.D. follows up on these results.  Also Note the following: If you experience worsening of your admission symptoms, develop shortness of breath, life threatening emergency, suicidal or homicidal thoughts you must seek medical attention immediately by calling 911 or calling your MD immediately  if symptoms less severe.  You must read complete  instructions/literature along with all the possible adverse reactions/side effects for all the Medicines you take and that have been prescribed to you. Take any new Medicines after you have completely understood and accpet all the possible adverse reactions/side effects.   Do not drive when taking Pain medications or sleeping medications (Benzodaizepines)  Do not take more than prescribed Pain, Sleep and Anxiety Medications. It is not advisable to combine anxiety,sleep and pain medications without talking with your primary care practitioner  Special Instructions: If you have smoked or chewed Tobacco  in the last 2 yrs please stop smoking, stop any regular Alcohol  and or any Recreational drug use.  Wear Seat belts while driving.  Please note: You were cared for by a hospitalist during your hospital stay. Once you are discharged, your primary care physician will handle any further medical issues. Please note that NO REFILLS for any discharge medications will be authorized once you are discharged, as it is imperative that you return to your primary care physician (or establish a relationship with a primary care physician if you do not have one) for your post hospital discharge needs so that they can reassess your need for medications and monitor your lab values.   Increase activity slowly   Complete by: As directed    Leave dressing on - Keep it clean, dry, and intact until clinic visit   Complete by: As directed       Allergies as of 02/02/2023       Reactions   Amoxicillin Hives, Other (See Comments)   Did it involve swelling of the face/tongue/throat, SOB, or low BP? No Did it involve sudden or severe rash/hives, skin peeling, or any reaction on the inside of your mouth or nose? Yes Did you need to seek medical attention at a hospital or doctor's office? Yes When did it last happen?     42 yrs old If all above answers are "NO", may proceed with cephalosporin use.   Fish Allergy Hives    Prednisone Other (See Comments)   irritable        Medication List     STOP taking these medications    acetaminophen 500 MG tablet Commonly known as: TYLENOL   alfuzosin 10 MG 24 hr tablet Commonly known as: UROXATRAL       TAKE these medications    albuterol 108 (90 Base) MCG/ACT inhaler Commonly known as: VENTOLIN HFA Inhale 2 puffs into the lungs every 6 (six) hours as needed for wheezing or shortness of breath (shortness of breath or wheezing).   cefadroxil 500 MG capsule Commonly known as: DURICEF Take 2 capsules (1,000 mg total) by mouth daily for 7 days.   lamoTRIgine 200 MG tablet Commonly known as: LAMICTAL Take 1 tablet (200 mg total) by mouth daily.   OLANZapine 10 MG tablet Commonly known as: ZYPREXA Take 1 tablet (10 mg total) by mouth at bedtime.   oxyCODONE 5 MG immediate release tablet Commonly known as: Oxy IR/ROXICODONE Take 1 tablet (5 mg total)  by mouth every 6 (six) hours as needed for moderate pain.   polyethylene glycol 17 g packet Commonly known as: MIRALAX / GLYCOLAX Take 17 g by mouth daily.               Discharge Care Instructions  (From admission, onward)           Start     Ordered   02/02/23 0000  Leave dressing on - Keep it clean, dry, and intact until clinic visit        02/02/23 0845            Follow-up Information     Betha Loa, MD Follow up on 02/03/2023.   Specialty: Orthopedic Surgery Contact information: [address] [place] [address] [phone]         [facility] Schedule an appointment as soon as possible for a visit in 1 week(s).   Contact information: [address] [place] [address] [phone]                Allergies  Allergen Reactions   Amoxicillin Hives and Other (See Comments)    Did it involve swelling of the face/tongue/throat, SOB, or low BP? No Did it involve sudden or severe rash/hives, skin peeling, or any  reaction on the inside of your mouth or nose? Yes Did you need to seek medical attention at a hospital or doctor's office? Yes When did it last happen?     42 yrs old If all above answers are "NO", may proceed with cephalosporin use.    Fish Allergy Hives   Prednisone Other (See Comments)    irritable     Other Procedures/Studies: CT CHEST ABDOMEN PELVIS W CONTRAST  Result Date: 01/16/2023 CLINICAL DATA:  Sepsis. EXAM: CT CHEST, ABDOMEN, AND PELVIS WITH CONTRAST TECHNIQUE: Multidetector CT imaging of the chest, abdomen and pelvis was performed following the standard protocol during bolus administration of intravenous contrast. RADIATION DOSE REDUCTION: This exam was performed according to the departmental dose-optimization program which includes automated exposure control, adjustment of the mA and/or kV according to patient size and/or use of iterative reconstruction technique. CONTRAST:  75mL OMNIPAQUE IOHEXOL 350 MG/ML SOLN COMPARISON:  January 10, 2023 FINDINGS: CT CHEST FINDINGS Cardiovascular: No significant vascular findings. Normal heart size. No pericardial effusion. Right PICC line terminates at the cavoatrial junction. Mediastinum/Nodes: No enlarged mediastinal, hilar, or axillary lymph nodes. Thyroid gland, trachea, and esophagus demonstrate no significant findings. Lungs/Pleura: Persistent widespread bilateral multifocal airspace consolidation with central and lower lobe predominance. When compared to the prior exam, there is mild improvement in the aeration of the lower lobes. No pleural effusion. Musculoskeletal: No chest wall mass or suspicious bone lesions identified. CT ABDOMEN PELVIS FINDINGS Hepatobiliary: No focal liver abnormality is seen. No gallstones, gallbladder wall thickening, or biliary dilatation. Pancreas: Unremarkable. No pancreatic ductal dilatation or surrounding inflammatory changes. Spleen: Normal in size without focal abnormality. Adrenals/Urinary Tract: Adrenal glands  are unremarkable. Kidneys are without focal lesion, or hydronephrosis. Bilateral few mm nonobstructive nephrolithiasis. The largest calculus in the right kidney measures 3 mm. Bladder is unremarkable. Stomach/Bowel: Stomach is within normal limits. No evidence of bowel wall thickening, distention, or inflammatory changes. Vascular/Lymphatic: No significant vascular findings are present. No enlarged abdominal or pelvic lymph nodes. Reproductive: Prostate is unremarkable. Other: No abdominal wall hernia or abnormality. No abdominopelvic ascites. Musculoskeletal: No acute or significant osseous findings. IMPRESSION: Persistent bilateral multifocal pneumonia with mild improvement in the aeration of  the lower lobes. Bilateral nonobstructive nephrolithiasis. Electronically Signed   By: Ted Mcalpine M.D.   On: 01/16/2023 12:49   DG Chest 2 View  Result Date: 01/16/2023 CLINICAL DATA:  Pneumonia EXAM: CHEST - 2 VIEW COMPARISON:  01/15/2023 FINDINGS: Increased asymmetric mixed interstitial and airspace opacities, worse in the right upper lobe but present bilaterally compatible with worsening bilateral pneumonia pattern. Stable heart size and vascularity. No effusion or pneumothorax. Trachea midline. Right PICC line tip SVC RA junction. IMPRESSION: Worsening bilateral multifocal pneumonia pattern, compared with yesterday. Electronically Signed   By: Judie Petit.  Shick M.D.   On: 01/16/2023 09:55   DG Abd Portable 1V  Result Date: 01/16/2023 CLINICAL DATA:  Nausea.  Mid abdominal pain. EXAM: PORTABLE ABDOMEN - 1 VIEW COMPARISON:  01/15/2023 FINDINGS: Similar appearance of mild gaseous distension of the small and large bowel loops without signs to suggest high-grade bowel obstruction. No signs of pneumoperitoneum. Osseous structures are unremarkable. IMPRESSION: Similar appearance of mild gaseous distension of the small and large bowel loops without signs to suggest high-grade bowel obstruction. Findings may reflect  ileus. Electronically Signed   By: Signa Kell M.D.   On: 01/16/2023 06:41   DG Abd Portable 1V  Result Date: 01/15/2023 CLINICAL DATA:  Abdominal pain EXAM: PORTABLE ABDOMEN - 1 VIEW COMPARISON:  None Available. FINDINGS: Mild distention of both small and large bowel loops. No supine evidence of free air. No acute osseous abnormality. IMPRESSION: Mild distention of both small and large bowel loops, which may represent ileus. Electronically Signed   By: Allegra Lai M.D.   On: 01/15/2023 18:22   DG Chest Port 1 View  Result Date: 01/15/2023 CLINICAL DATA:  Cough. EXAM: PORTABLE CHEST 1 VIEW COMPARISON:  Yesterday FINDINGS: Mild improvement in bilateral airspace disease which is more extensive on the right. Right PICC with tip at the upper cavoatrial junction. Stable heart size and mediastinal contours. No effusion or pneumothorax. IMPRESSION: Multifocal pneumonia with mildly improved aeration. Electronically Signed   By: Tiburcio Pea M.D.   On: 01/15/2023 06:19   ECHOCARDIOGRAM COMPLETE  Result Date: 01/14/2023    ECHOCARDIOGRAM REPORT   Patient Name:   RONALD LONDO Date of Exam: 01/14/2023 Medical Rec #:  [id number]    Height:       69.0 in Accession #:    [id number]   Weight:       182.5 lb Date of Birth:  28-Sep-1981    BSA:          1.987 m Patient Age:    41 years     BP:           123/97 mmHg Patient Gender: M            HR:           92 bpm. Exam Location:  Inpatient Procedure: 2D Echo, Color Doppler, Cardiac Doppler and Intracardiac            Opacification Agent Indications:    NSTEMI I21.4  History:        Patient has no prior history of Echocardiogram examinations.                 NSTEMI, Substance Abuse; Risk Factors:Current Smoker.  Sonographer:    Aron Baba Referring Phys: [id number] Lorin Glass  Sonographer Comments: Suboptimal parasternal window. Image acquisition challenging due to respiratory motion. IMPRESSIONS  1. Left ventricular ejection fraction, by estimation, is 60 to  65%. The left ventricle has normal function. The  left ventricle has no regional wall motion abnormalities. Left ventricular diastolic parameters were normal.  2. Right ventricular systolic function is normal. The right ventricular size is normal.  3. The mitral valve is normal in structure. No evidence of mitral valve regurgitation. No evidence of mitral stenosis.  4. The aortic valve is normal in structure. Aortic valve regurgitation is not visualized. No aortic stenosis is present.  5. The inferior vena cava is normal in size with greater than 50% respiratory variability, suggesting right atrial pressure of 3 mmHg. FINDINGS  Left Ventricle: Left ventricular ejection fraction, by estimation, is 60 to 65%. The left ventricle has normal function. The left ventricle has no regional wall motion abnormalities. Definity contrast agent was given IV to delineate the left ventricular  endocardial borders. The left ventricular internal cavity size was normal in size. There is no left ventricular hypertrophy. Left ventricular diastolic parameters were normal. Right Ventricle: The right ventricular size is normal. No increase in right ventricular wall thickness. Right ventricular systolic function is normal. Left Atrium: Left atrial size was normal in size. Right Atrium: Right atrial size was normal in size. Pericardium: There is no evidence of pericardial effusion. Mitral Valve: The mitral valve is normal in structure. No evidence of mitral valve regurgitation. No evidence of mitral valve stenosis. Tricuspid Valve: The tricuspid valve is normal in structure. Tricuspid valve regurgitation is not demonstrated. No evidence of tricuspid stenosis. Aortic Valve: The aortic valve is normal in structure. Aortic valve regurgitation is not visualized. No aortic stenosis is present. Pulmonic Valve: The pulmonic valve was normal in structure. Pulmonic valve regurgitation is not visualized. No evidence of pulmonic stenosis. Aorta: The  aortic root is normal in size and structure. Venous: The inferior vena cava is normal in size with greater than 50% respiratory variability, suggesting right atrial pressure of 3 mmHg. IAS/Shunts: No atrial level shunt detected by color flow Doppler.  LEFT VENTRICLE PLAX 2D LVIDd:         5.20 cm   Diastology LVIDs:         3.40 cm   LV e' medial:    10.10 cm/s LV PW:         1.00 cm   LV E/e' medial:  8.9 LV IVS:        1.00 cm   LV e' lateral:   15.00 cm/s LVOT diam:     1.90 cm   LV E/e' lateral: 6.0 LV SV:         62 LV SV Index:   31 LVOT Area:     2.84 cm  RIGHT VENTRICLE RV S prime:     13.10 cm/s TAPSE (M-mode): 1.6 cm LEFT ATRIUM           Index        RIGHT ATRIUM           Index LA diam:      3.20 cm 1.61 cm/m   RA Area:     14.90 cm LA Vol (A4C): 27.9 ml 14.04 ml/m  RA Volume:   41.70 ml  20.98 ml/m  AORTIC VALVE LVOT Vmax:   136.00 cm/s LVOT Vmean:  89.600 cm/s LVOT VTI:    0.218 m  AORTA Ao Root diam: 3.20 cm Ao Asc diam:  3.80 cm MITRAL VALVE MV Area (PHT): 3.93 cm    SHUNTS MV Decel Time: 193 msec    Systemic VTI:  0.22 m MV E velocity: 90.10 cm/s  Systemic Diam: 1.90 cm  MV A velocity: 59.70 cm/s MV E/A ratio:  1.51 Charlton Haws MD Electronically signed by Charlton Haws MD Signature Date/Time: 01/14/2023/2:53:08 PM    Final    DG CHEST PORT 1 VIEW  Result Date: 01/14/2023 CLINICAL DATA:  Respiratory failure EXAM: PORTABLE CHEST 1 VIEW COMPARISON:  CXR 01/12/23 FINDINGS: Right arm PICC with the tip at the cavoatrial junction. There are persistent bilateral patchy airspace opacities, not significantly changed from prior exam, that remain worrisome for infection. No pleural effusion. No pneumothorax. No radiographically apparent displaced rib fractures. Visualized upper abdomen is unremarkable. IMPRESSION: No significant change in bilateral patchy airspace opacities, which remain worrisome for infection. Improved bilateral pleural effusions. Electronically Signed   By: Lorenza Cambridge M.D.   On:  01/14/2023 07:53   DG Chest Port 1 View  Result Date: 01/12/2023 CLINICAL DATA:  Pneumonia. EXAM: PORTABLE CHEST 1 VIEW COMPARISON:  Radiograph yesterday. CT 01/10/2023 FINDINGS: Significant patient rotation on the current exam, lung volumes are low. Tip of the right upper extremity PICC at the atrial caval junction. Heterogeneous bilateral lung opacities with equivocal worsening from yesterday. Small bilateral pleural effusions. No pneumothorax. Stable heart size and mediastinal contours allowing for rotation and low lung volumes. IMPRESSION: 1. Equivocal worsening of bilateral lung opacities since yesterday. 2. Small bilateral pleural effusions. 3. Stable right upper extremity PICC. Electronically Signed   By: Narda Rutherford M.D.   On: 01/12/2023 11:33   DG CHEST PORT 1 VIEW  Result Date: 01/11/2023 CLINICAL DATA:  Hypoxia EXAM: PORTABLE CHEST 1 VIEW COMPARISON:  01/11/2023 FINDINGS: Interval placement of a right sided PICC is seen at the cavoatrial junction. Cardiac shadow is enlarged accentuated by the portable technique. Increasing central vascular congestion is noted. Multifocal airspace opacities are noted slightly increased from the prior study. This is likely related to a poor inspiratory effort. Some superimposed edema could not be totally excluded. IMPRESSION: Patchy airspace opacities bilaterally somewhat increased from the prior exam likely related to a poor inspiratory effort. New right-sided PICC is seen in satisfactory position. Electronically Signed   By: Alcide Clever M.D.   On: 01/11/2023 21:03   DG Chest Port 1 View  Result Date: 01/11/2023 CLINICAL DATA:  42 year old male with possible aspiration pneumonia. EXAM: PORTABLE CHEST 1 VIEW COMPARISON:  Chest x-ray 01/10/2023. FINDINGS: An endotracheal tube is in place with tip 5.2 cm above the carina. A nasogastric tube is seen extending into the stomach, however, the tip of the nasogastric tube extends below the lower margin of the image.  Patchy ill-defined airspace consolidation again noted throughout the mid to lower lungs bilaterally (right greater than left), with slightly improved aeration compared with yesterday's examination. No pleural effusions. No pneumothorax. No evidence of pulmonary edema. Heart size is normal. Upper mediastinal contours are within normal limits. IMPRESSION: 1. Support apparatus, as above. 2. Multilobar bilateral pneumonia redemonstrated with slight improved aeration compared to yesterday's examination. Electronically Signed   By: Trudie Reed M.D.   On: 01/11/2023 05:49   [place] EKG SITE RITE  Result Date: 01/10/2023 If Site Rite image not attached, placement could not be confirmed due to current cardiac rhythm.  CT CHEST ABDOMEN PELVIS WO CONTRAST  Result Date: 01/10/2023 CLINICAL DATA:  Sepsis, OD/sepsis EXAM: CT CHEST, ABDOMEN AND PELVIS WITHOUT CONTRAST TECHNIQUE: Multidetector CT imaging of the chest, abdomen and pelvis was performed following the standard protocol without IV contrast. RADIATION DOSE REDUCTION: This exam was performed according to the departmental dose-optimization program which includes automated exposure control, adjustment  of the mA and/or kV according to patient size and/or use of iterative reconstruction technique. COMPARISON:  Chest radiograph performed earlier on the same date. FINDINGS: CT CHEST FINDINGS Cardiovascular: No significant vascular findings. Normal heart size. No pericardial effusion. Mediastinum/Nodes: No enlarged mediastinal, hilar, or axillary lymph nodes. Thyroid gland, trachea, and esophagus demonstrate no significant findings. Endotracheal tube and feeding tube coursing below the diaphragm in the body of the stomach. Lungs/Pleura: Bilateral multifocal lung opacities prominent in the dependent portion of the bilateral lower lobes with air bronchograms consistent with multifocal pneumonia. No pleural effusion or pneumothorax. Musculoskeletal: No chest wall mass or  suspicious bone lesions identified. CT ABDOMEN PELVIS FINDINGS Hepatobiliary: No focal liver abnormality is seen. No gallstones, gallbladder wall thickening, or biliary dilatation. Pancreas: Unremarkable. No pancreatic ductal dilatation or surrounding inflammatory changes. Spleen: Normal in size without focal abnormality. Adrenals/Urinary Tract: Adrenal glands are unremarkable. Punctate nonobstructing calculus in the upper pole of the left kidney. There is also 4 mm nonobstructing calculus in the interpolar region of the right kidney. No hydronephrosis or ureteral calculus. Foley's catheter in place, urinary bladder is otherwise unremarkable. Stomach/Bowel: Stomach is within normal limits. NG tube with distal tip terminating the body of the stomach. Small bowel loops are normal in caliber. Appendix not identified, however no inflammatory changes in the pericecal region. No evidence of bowel wall thickening, distention, or inflammatory changes. Vascular/Lymphatic: No significant vascular findings are present. No enlarged abdominal or pelvic lymph nodes. Reproductive: Prostate is unremarkable. Other: No abdominal wall hernia or abnormality. No abdominopelvic ascites. Musculoskeletal: No acute or significant osseous findings. IMPRESSION: 1. Bilateral multifocal lung opacities prominent in the dependent portion of the bilateral lower lobes with air bronchograms consistent with multifocal pneumonia. Follow-up examination to resolution is recommended. 2. Endotracheal tube, feeding tube and Foley's catheter are in appropriate position. 3. No evidence of bowel obstruction. No evidence of colitis or diverticulitis. 4. Bilateral nonobstructing renal calculi. No hydronephrosis or ureteral calculus. Electronically Signed   By: Larose Hires D.O.   On: 01/10/2023 15:16   CT Head Wo Contrast  Result Date: 01/10/2023 CLINICAL DATA:  Provided history: Mental status change, unknown cause. EXAM: CT HEAD WITHOUT CONTRAST TECHNIQUE:  Contiguous axial images were obtained from the base of the skull through the vertex without intravenous contrast. RADIATION DOSE REDUCTION: This exam was performed according to the departmental dose-optimization program which includes automated exposure control, adjustment of the mA and/or kV according to patient size and/or use of iterative reconstruction technique. COMPARISON:  No pertinent prior exams available for comparison. FINDINGS: Brain: No age advanced or lobar predominant parenchymal atrophy. 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 There is no acute intracranial hemorrhage. No demarcated cortical infarct. No extra-axial fluid collection. No evidence of an intracranial mass. No midline shift. Vascular: No hyperdense vessel. Atherosclerotic calcifications. Skull: No fracture or aggressive osseous lesion. Sinuses/Orbits: No mass or acute finding within the imaged orbits. No significant paranasal sinus disease. Other: Partially imaged life-support tubes. Secretions within the nasopharynx. IMPRESSION: 1.  No evidence of an acute intracranial abnormality. 2. Chronic right basal ganglia lacunar infarct. Electronically Signed   By: Jackey Loge D.O.   On: 01/10/2023 14:59   DG Chest Port 1 View  Result Date: 01/10/2023 CLINICAL DATA:  Intubation EXAM: PORTABLE CHEST 1 VIEW COMPARISON:  Chest radiograph 07/02/2021 FINDINGS: Endotracheal tube terminates approximately 4 cm above the carina. Enteric tube courses below diaphragm with the tip projecting over the location of the stomach and the side hole  at the level of the GE junction. No pleural effusion. No pneumothorax. Normal cardiac and mediastinal contours. There are prominent bilateral perihilar airspace opacities, which could represent pulmonary edema or atypical infection. No radiographically apparent displaced rib fractures. IMPRESSION: 1. Prominent bilateral perihilar airspace opacities, which could represent pulmonary edema or atypical  infection. If the patient is immunocompromised, fungal infection is also a differential consideration. 2. Endotracheal tube terminates approximately 4 cm above the carina. 3. Enteric tube side hole at the level of the GE junction, recommend advancement by 5 cm. Electronically Signed   By: Lorenza Cambridge M.D.   On: 01/10/2023 12:04     TODAY-DAY OF DISCHARGE:  Subjective:   Randall Parsons today has no headache,no chest abdominal pain,no new weakness tingling or numbness, feels much better wants to go home today.  Objective:   Blood pressure 106/78, pulse 65, temperature 98 F (36.7 C), temperature source Oral, resp. rate 19, SpO2 97 %.  Intake/Output Summary (Last 24 hours) at 02/02/2023 1035 Last data filed at 02/01/2023 2200 Gross per 24 hour  Intake 960 ml  Output --  Net 960 ml   There were no vitals filed for this visit.  Exam: Awake Alert, Oriented *3, No new F.N deficits, Normal affect [place].AT,PERRAL Supple Neck,No JVD, No cervical lymphadenopathy appriciated.  Symmetrical Chest wall movement, Good air movement bilaterally, CTAB RRR,No Gallops,Rubs or new Murmurs, No Parasternal Heave +ve B.Sounds, Abd Soft, Non tender, No organomegaly appriciated, No rebound -guarding or rigidity. No Cyanosis, Clubbing or edema, No new Rash or bruise   PERTINENT RADIOLOGIC STUDIES: No results found.   PERTINENT LAB RESULTS: CBC: Recent Labs    02/01/23 0259 02/02/23 0240  WBC 5.5 6.1  HGB 12.0* 10.8*  HCT 37.3* 33.5*  PLT 359 314   CMET CMP     Component Value Date/Time   NA 135 02/01/2023 0259   K 3.7 02/01/2023 0259   CL 105 02/01/2023 0259   CO2 23 02/01/2023 0259   GLUCOSE 174 (H) 02/01/2023 0259   BUN 9 02/01/2023 0259   CREATININE 0.87 02/01/2023 0259   CALCIUM 8.5 (L) 02/01/2023 0259   PROT 6.1 (L) 01/18/2023 0419   ALBUMIN 2.4 (L) 01/18/2023 0419   AST 40 01/18/2023 0419   ALT 121 (H) 01/18/2023 0419   ALKPHOS 82 01/18/2023 0419   BILITOT 0.8 01/18/2023 0419    GFRNONAA >60 02/01/2023 0259   GFRAA >60 02/12/2020 2120    GFR Estimated Creatinine Clearance: 108.4 mL/min (by C-G formula based on SCr of 0.87 mg/dL). No results for input(s): "LIPASE", "AMYLASE" in the last 72 hours. No results for input(s): "CKTOTAL", "CKMB", "CKMBINDEX", "TROPONINI" in the last 72 hours. Invalid input(s): "POCBNP" No results for input(s): "DDIMER" in the last 72 hours. No results for input(s): "HGBA1C" in the last 72 hours. No results for input(s): "CHOL", "HDL", "LDLCALC", "TRIG", "CHOLHDL", "LDLDIRECT" in the last 72 hours. No results for input(s): "TSH", "T4TOTAL", "T3FREE", "THYROIDAB" in the last 72 hours.  Invalid input(s): "FREET3" No results for input(s): "VITAMINB12", "FOLATE", "FERRITIN", "TIBC", "IRON", "RETICCTPCT" in the last 72 hours. Coags: No results for input(s): "INR" in the last 72 hours.  Invalid input(s): "PT" Microbiology: Recent Results (from the past 240 hour(s))  Aerobic/Anaerobic Culture w Gram Stain (surgical/deep wound)     Status: None (Preliminary result)   Collection Time: 01/31/23  8:46 PM   Specimen: Abscess  Result Value Ref Range Status   Specimen Description ABSCESS  Final   Special Requests INDEX FINGER  Final   Gram Stain   Final    RARE WBC PRESENT, PREDOMINANTLY PMN RARE GRAM POSITIVE COCCI    Culture   Final    MODERATE GROUP A STREP (S.PYOGENES) ISOLATED Beta hemolytic streptococci are predictably susceptible to penicillin and other beta lactams. Susceptibility testing not routinely performed. Performed at [facility], [address] [place], [place] [address]    Report Status PENDING  Incomplete    FURTHER DISCHARGE INSTRUCTIONS:  Get Medicines reviewed and adjusted: Please take all your medications with you for your next visit with your Primary MD  Laboratory/radiological data: Please request your Primary MD to go over all hospital tests and procedure/radiological results at the follow up, please  ask your Primary MD to get all Hospital records sent to his/her office.  In some cases, they will be blood work, cultures and biopsy results pending at the time of your discharge. Please request that your primary care M.D. goes through all the records of your hospital data and follows up on these results.  Also Note the following: If you experience worsening of your admission symptoms, develop shortness of breath, life threatening emergency, suicidal or homicidal thoughts you must seek medical attention immediately by calling 911 or calling your MD immediately  if symptoms less severe.  You must read complete instructions/literature along with all the possible adverse reactions/side effects for all the Medicines you take and that have been prescribed to you. Take any new Medicines after you have completely understood and accpet all the possible adverse reactions/side effects.   Do not drive when taking Pain medications or sleeping medications (Benzodaizepines)  Do not take more than prescribed Pain, Sleep and Anxiety Medications. It is not advisable to combine anxiety,sleep and pain medications without talking with your primary care practitioner  Special Instructions: If you have smoked or chewed Tobacco  in the last 2 yrs please stop smoking, stop any regular Alcohol  and or any Recreational drug use.  Wear Seat belts while driving.  Please note: You were cared for by a hospitalist during your hospital stay. Once you are discharged, your primary care physician will handle any further medical issues. Please note that NO REFILLS for any discharge medications will be authorized once you are discharged, as it is imperative that you return to your primary care physician (or establish a relationship with a primary care physician if you do not have one) for your post hospital discharge needs so that they can reassess your need for medications and monitor your lab values.  Total Time spent coordinating  discharge including counseling, education and face to face time equals greater than 30 minutes.  SignedJeoffrey Massed 02/02/2023 10:35 AM

## 2023-02-02 NOTE — Progress Notes (Signed)
This RN reviewed discharge instructions and answered all questions. Advised patient to pick up medications at home pharmacy & that he has follow-up appt tomorrow morning. Patient verbalized understanding. All belongings with patient before leaving unit. Removed PIV. Escorted patient off unit via wheelchair directly to his car without incident.

## 2023-02-02 NOTE — TOC Transition Note (Addendum)
Transition of Care [facility]) - CM/SW Discharge Note   Patient Details  Name: Randall Parsons MRN: [id number] Date of Birth: 12-02-1980  [facility]) CM/SW Contact:  Lawerance Sabal, RN Phone Number: 02/02/2023, 9:29 AM   Clinical Narrative:     Spoke w patient at bedside to discuss barriers for obtaining medications and DC plan.  He states that last discharge he didn't feel like waiting at [facility] for them to open, or go back once they were open, therefore he did not get his medications.  Reviewed with him where meds were sent, that he has transportation to get them, states "I drive" and that he has medicaid coverage.  Reinforced importance of getting medications and follow up with hand surgeon.          Patient Goals and CMS Choice      Discharge Placement                         Discharge Plan and Services Additional resources added to the After Visit Summary for                                       Social Determinants of Health (SDOH) Interventions SDOH Screenings   Alcohol Screen: Medium Risk (07/14/2019)  Depression (PHQ2-9): Low Risk  (08/03/2021)  Tobacco Use: Medium Risk (02/01/2023)     Readmission Risk Interventions     No data to display

## 2023-02-02 NOTE — Unmapped (Signed)
Formatting of this note is different from the original.  Images from the original note were not included.  Transition of Care [facility]) - CM/SW Discharge Note    Patient Details   Name: Dywane Peruski  MRN: [id number]  Date of Birth: July 25, 1981    [facility]) CM/SW Contact:   Lawerance Sabal, RN  Phone Number:  02/02/2023, 9:29 AM    Clinical Narrative:       Spoke w patient at bedside to discuss barriers for obtaining medications and DC plan.   He states that last discharge he didn't feel like waiting at [facility] for them to open, or go back once they were open, therefore he did not get his medications.   Reviewed with him where meds were sent, that he has transportation to get them, states "I drive" and that he has medicaid coverage.   Reinforced importance of getting medications and follow up with hand surgeon.           Patient Goals and CMS Choice        Discharge Placement                  Discharge Plan and Services  Additional resources added to the After Visit Summary for                                Social Determinants of Health (SDOH) Interventions  SDOH Screenings     Alcohol Screen: Medium Risk (07/14/2019)   Depression (PHQ2-9): Low Risk  (08/03/2021)   Tobacco Use: Medium Risk (02/01/2023)     Readmission Risk Interventions     No data to display             Electronically signed by Lawerance Sabal, RN at 02/02/2023  9:32 AM EDT

## 2023-02-02 NOTE — Discharge Summary (Signed)
Formatting of this note is different from the original.  Images from the original note were not included.      PATIENT DETAILS  Name: Lucas Hicks  Age: 42 y.o.  Sex: male  Date of Birth: [birth year]  MRN: [id number].  Admitting Physician: Clydie Braun, MD  [id number]    Admit Date: 01/31/2023  Discharge date: 02/02/2023    Recommendations for Outpatient Follow-up:   Follow up with PCP in 1-2 weeks  Please obtain CMP/CBC in one week  Please ensure follow-up with orthopedics and psychiatry.    Admitted From:   Home    Disposition:  Home    Discharge Condition:  good    CODE STATUS:    Code Status: Full Code     Diet recommendation:   Diet Order          Diet general            Diet Heart Room service appropriate? Yes; Fluid consistency: Thin; Fluid restriction: 1500 mL Fluid  Diet effective now                        Brief Summary:  Patient is a 42 y.o.  male with history of IVDA, substance induced mood disorder, HTN who presented with abscess involving the right index finger.    Significant events:  4/26>> admit to [facility]-abscess right index finger.    Significant studies:  None    Significant microbiology data:  4/26>> hand culture: Moderate group A strep    Procedures:  4/26>> I&D-Dr. Merlyn Lot    Consults:  Hand surgery  Psychiatry      Brief Hospital Course:  Right index finger soft tissue infection with abscess-s/p I&D on 4/26  IntraOp cultures positive for group A strep  Initially on vancomycin/Rocephin-has been switched to oral antibiotics  Pain controlled with oral narcotics  Discussed with Dr. Merlyn Lot over the phone-okay to discharge-his office will arrange follow-up for hydrotherapy.      Paranoid delusions  Substance abuse mood disorder  Psych consulted-started on Zyprexa-continued on Lamictal.  Follow-up with outpatient psychiatry    IVDA-cocaine use  No signs of withdrawal  As needed benzodiazepines    Chronic HCV  Outpatient follow-up with GI/ID clinic as previously planned-see prior  notes.    BMI:  Estimated body mass index is 24.37 kg/m as calculated from the following:    Height as of 01/30/23: 5\' 9"  (1.753 m).    Weight as of 01/30/23: 74.8 kg.     BMI:  Estimated body mass index is 24.37 kg/m as calculated from the following:    Height as of 01/30/23: 5\' 9"  (1.753 m).    Weight as of 01/30/23: 74.8 kg.     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 in 1-2 weeks    Please get a complete blood count and chemistry panel checked by your Primary MD at your next visit, and again as instructed by your Primary MD.    Get Medicines reviewed and adjusted:  Please take all your medications with you for your next visit with your Primary MD    Laboratory/radiological data:  Please request your Primary MD to go over all hospital tests and procedure/radiological results at the follow up, please ask your Primary MD to get all Hospital records sent to his/her office.    In some cases, they will be blood work, cultures and biopsy results pending at the time of your discharge. Please request that your primary care M.D. follows up on these results.    Also Note the following:  If you experience worsening of your admission symptoms, develop shortness of breath, life threatening emergency, suicidal or homicidal thoughts you must seek medical attention immediately by calling 911 or calling your  MD immediately  if symptoms less severe.    You must read complete instructions/literature along with all the possible adverse reactions/side effects for all the Medicines you take and that have been prescribed to you. Take any new Medicines after you have completely understood and accpet all the possible adverse reactions/side effects.     Do not drive when taking Pain medications or sleeping medications (Benzodaizepines)    Do not take more than prescribed Pain, Sleep and Anxiety Medications. It is not advisable to combine anxiety,sleep and pain medications without talking with your primary care practitioner    Special Instructions: If you have smoked or chewed Tobacco  in the last 2 yrs please stop smoking, stop any regular Alcohol  and or any Recreational drug use.    Wear Seat belts while driving.    Please note:  You were cared for by a hospitalist during your hospital stay. Once you are discharged, your primary care physician will handle any further medical issues. Please note that NO REFILLS for any discharge medications will be authorized once you are discharged, as it is imperative that you return to your primary care physician (or establish a relationship with a primary care physician if you do not have one) for your post hospital discharge needs so that they can reassess your need for medications and monitor your lab values.    Increase activity slowly   Complete by: As directed     Leave dressing on - Keep it clean, dry, and intact until clinic visit   Complete by: As directed          Allergies as of 02/02/2023         Reactions    Amoxicillin Hives, Other (See Comments)    Did it involve swelling of the face/tongue/throat, SOB, or low BP? No  Did it involve sudden or severe rash/hives, skin peeling, or any reaction on the inside of your mouth or nose? Yes  Did you need to seek medical attention at a hospital or doctor's office? Yes  When did it last happen?     42 yrs old  If all above answers are  "NO", may proceed with cephalosporin use.    Fish Allergy Hives    Prednisone Other (See Comments)    irritable           Medication List  STOP taking these medications      acetaminophen 500 MG tablet  Commonly known as: TYLENOL    alfuzosin 10 MG 24 hr tablet  Commonly known as: UROXATRAL          TAKE these medications      albuterol 108 (90 Base) MCG/ACT inhaler  Commonly known as: VENTOLIN HFA  Inhale 2 puffs into the lungs every 6 (six) hours as needed for wheezing or shortness of breath (shortness of breath or wheezing).    cefadroxil 500 MG capsule  Commonly known as: DURICEF  Take 2 capsules (1,000 mg total) by mouth daily for 7 days.    lamoTRIgine 200 MG tablet  Commonly known as: LAMICTAL  Take 1 tablet (200 mg total) by mouth daily.    OLANZapine 10 MG tablet  Commonly known as: ZYPREXA  Take 1 tablet (10 mg total) by mouth at bedtime.    oxyCODONE 5 MG immediate release tablet  Commonly known as: Oxy IR/ROXICODONE  Take 1 tablet (5 mg total) by mouth every 6 (six) hours as needed for moderate pain.    polyethylene glycol 17 g packet  Commonly known as: MIRALAX / GLYCOLAX  Take 17 g by mouth daily.                  Discharge Care Instructions   (From admission, onward)             Start     Ordered    02/02/23 0000  Leave dressing on - Keep it clean, dry, and intact until clinic visit         02/02/23 0845                Follow-up Information       Betha Loa, MD Follow up on 02/03/2023.    Specialty: Orthopedic Surgery  Contact information:  [address]  [place] [address]  [phone]           [facility] Schedule an appointment as soon as possible for a visit in 1 week(s).    Contact information:  [address]  [place] [address]  [phone]                    Allergies   Allergen Reactions    Amoxicillin Hives and Other (See Comments)     Did it involve swelling of the face/tongue/throat, SOB, or low BP? No  Did it involve sudden or severe  rash/hives, skin peeling, or any reaction on the inside of your mouth or nose? Yes  Did you need to seek medical attention at a hospital or doctor's office? Yes  When did it last happen?     42 yrs old  If all above answers are "NO", may proceed with cephalosporin use.     Fish Allergy Hives    Prednisone Other (See Comments)     irritable     Other Procedures/Studies:  CT CHEST ABDOMEN PELVIS W CONTRAST    Result Date: 01/16/2023  CLINICAL DATA:  Sepsis. EXAM: CT CHEST, ABDOMEN, AND PELVIS WITH CONTRAST TECHNIQUE: Multidetector CT imaging of the chest, abdomen and pelvis was performed following the standard protocol during bolus administration of intravenous contrast. RADIATION DOSE REDUCTION: This exam was performed according to the departmental dose-optimization program which includes automated exposure control, adjustment of the mA and/or kV according to patient size and/or use of iterative reconstruction technique. CONTRAST:  75mL OMNIPAQUE IOHEXOL  350 MG/ML SOLN COMPARISON:  January 10, 2023 FINDINGS: CT CHEST FINDINGS Cardiovascular: No significant vascular findings. Normal heart size. No pericardial effusion. Right PICC line terminates at the cavoatrial junction. Mediastinum/Nodes: No enlarged mediastinal, hilar, or axillary lymph nodes. Thyroid gland, trachea, and esophagus demonstrate no significant findings. Lungs/Pleura: Persistent widespread bilateral multifocal airspace consolidation with central and lower lobe predominance. When compared to the prior exam, there is mild improvement in the aeration of the lower lobes. No pleural effusion. Musculoskeletal: No chest wall mass or suspicious bone lesions identified. CT ABDOMEN PELVIS FINDINGS Hepatobiliary: No focal liver abnormality is seen. No gallstones, gallbladder wall thickening, or biliary dilatation. Pancreas: Unremarkable. No pancreatic ductal dilatation or surrounding inflammatory changes. Spleen: Normal in size without focal abnormality.  Adrenals/Urinary Tract: Adrenal glands are unremarkable. Kidneys are without focal lesion, or hydronephrosis. Bilateral few mm nonobstructive nephrolithiasis. The largest calculus in the right kidney measures 3 mm. Bladder is unremarkable. Stomach/Bowel: Stomach is within normal limits. No evidence of bowel wall thickening, distention, or inflammatory changes. Vascular/Lymphatic: No significant vascular findings are present. No enlarged abdominal or pelvic lymph nodes. Reproductive: Prostate is unremarkable. Other: No abdominal wall hernia or abnormality. No abdominopelvic ascites. Musculoskeletal: No acute or significant osseous findings. IMPRESSION: Persistent bilateral multifocal pneumonia with mild improvement in the aeration of the lower lobes. Bilateral nonobstructive nephrolithiasis. Electronically Signed   By: Ted Mcalpine M.D.   On: 01/16/2023 12:49     DG Chest 2 View    Result Date: 01/16/2023  CLINICAL DATA:  Pneumonia EXAM: CHEST - 2 VIEW COMPARISON:  01/15/2023 FINDINGS: Increased asymmetric mixed interstitial and airspace opacities, worse in the right upper lobe but present bilaterally compatible with worsening bilateral pneumonia pattern. Stable heart size and vascularity. No effusion or pneumothorax. Trachea midline. Right PICC line tip SVC RA junction. IMPRESSION: Worsening bilateral multifocal pneumonia pattern, compared with yesterday. Electronically Signed   By: Judie Petit.  Shick M.D.   On: 01/16/2023 09:55     DG Abd Portable 1V    Result Date: 01/16/2023  CLINICAL DATA:  Nausea.  Mid abdominal pain. EXAM: PORTABLE ABDOMEN - 1 VIEW COMPARISON:  01/15/2023 FINDINGS: Similar appearance of mild gaseous distension of the small and large bowel loops without signs to suggest high-grade bowel obstruction. No signs of pneumoperitoneum. Osseous structures are unremarkable. IMPRESSION: Similar appearance of mild gaseous distension of the small and large bowel loops without signs to suggest high-grade bowel  obstruction. Findings may reflect ileus. Electronically Signed   By: Signa Kell M.D.   On: 01/16/2023 06:41     DG Abd Portable 1V    Result Date: 01/15/2023  CLINICAL DATA:  Abdominal pain EXAM: PORTABLE ABDOMEN - 1 VIEW COMPARISON:  None Available. FINDINGS: Mild distention of both small and large bowel loops. No supine evidence of free air. No acute osseous abnormality. IMPRESSION: Mild distention of both small and large bowel loops, which may represent ileus. Electronically Signed   By: Allegra Lai M.D.   On: 01/15/2023 18:22     DG Chest Port 1 View    Result Date: 01/15/2023  CLINICAL DATA:  Cough. EXAM: PORTABLE CHEST 1 VIEW COMPARISON:  Yesterday FINDINGS: Mild improvement in bilateral airspace disease which is more extensive on the right. Right PICC with tip at the upper cavoatrial junction. Stable heart size and mediastinal contours. No effusion or pneumothorax. IMPRESSION: Multifocal pneumonia with mildly improved aeration. Electronically Signed   By: Tiburcio Pea M.D.   On: 01/15/2023 06:19     ECHOCARDIOGRAM  COMPLETE    Result Date: 01/14/2023     ECHOCARDIOGRAM REPORT   Patient Name:   GRANITE GODMAN Date of Exam: 01/14/2023 Medical Rec #:  [id number]    Height:       69.0 in Accession #:    [id number]   Weight:       182.5 lb Date of Birth:  07/09/1981    BSA:          1.987 m Patient Age:    41 years     BP:           123/97 mmHg Patient Gender: M            HR:           92 bpm. Exam Location:  Inpatient Procedure: 2D Echo, Color Doppler, Cardiac Doppler and Intracardiac            Opacification Agent Indications:    NSTEMI I21.4  History:        Patient has no prior history of Echocardiogram examinations.                 NSTEMI, Substance Abuse; Risk Factors:Current Smoker.  Sonographer:    Aron Baba Referring Phys: [id number] Lorin Glass  Sonographer Comments: Suboptimal parasternal window. Image acquisition challenging due to respiratory motion. IMPRESSIONS  1. Left ventricular ejection  fraction, by estimation, is 60 to 65%. The left ventricle has normal function. The left ventricle has no regional wall motion abnormalities. Left ventricular diastolic parameters were normal.  2. Right ventricular systolic function is normal. The right ventricular size is normal.  3. The mitral valve is normal in structure. No evidence of mitral valve regurgitation. No evidence of mitral stenosis.  4. The aortic valve is normal in structure. Aortic valve regurgitation is not visualized. No aortic stenosis is present.  5. The inferior vena cava is normal in size with greater than 50% respiratory variability, suggesting right atrial pressure of 3 mmHg. FINDINGS  Left Ventricle: Left ventricular ejection fraction, by estimation, is 60 to 65%. The left ventricle has normal function. The left ventricle has no regional wall motion abnormalities. Definity contrast agent was given IV to delineate the left ventricular  endocardial borders. The left ventricular internal cavity size was normal in size. There is no left ventricular hypertrophy. Left ventricular diastolic parameters were normal. Right Ventricle: The right ventricular size is normal. No increase in right ventricular wall thickness. Right ventricular systolic function is normal. Left Atrium: Left atrial size was normal in size. Right Atrium: Right atrial size was normal in size. Pericardium: There is no evidence of pericardial effusion. Mitral Valve: The mitral valve is normal in structure. No evidence of mitral valve regurgitation. No evidence of mitral valve stenosis. Tricuspid Valve: The tricuspid valve is normal in structure. Tricuspid valve regurgitation is not demonstrated. No evidence of tricuspid stenosis. Aortic Valve: The aortic valve is normal in structure. Aortic valve regurgitation is not visualized. No aortic stenosis is present. Pulmonic Valve: The pulmonic valve was normal in structure. Pulmonic valve regurgitation is not visualized. No evidence of  pulmonic stenosis. Aorta: The aortic root is normal in size and structure. Venous: The inferior vena cava is normal in size with greater than 50% respiratory variability, suggesting right atrial pressure of 3 mmHg. IAS/Shunts: No atrial level shunt detected by color flow Doppler.  LEFT VENTRICLE PLAX 2D LVIDd:         5.20 cm   Diastology LVIDs:  3.40 cm   LV e' medial:    10.10 cm/s LV PW:         1.00 cm   LV E/e' medial:  8.9 LV IVS:        1.00 cm   LV e' lateral:   15.00 cm/s LVOT diam:     1.90 cm   LV E/e' lateral: 6.0 LV SV:         62 LV SV Index:   31 LVOT Area:     2.84 cm  RIGHT VENTRICLE RV S prime:     13.10 cm/s TAPSE (M-mode): 1.6 cm LEFT ATRIUM           Index        RIGHT ATRIUM           Index LA diam:      3.20 cm 1.61 cm/m   RA Area:     14.90 cm LA Vol (A4C): 27.9 ml 14.04 ml/m  RA Volume:   41.70 ml  20.98 ml/m  AORTIC VALVE LVOT Vmax:   136.00 cm/s LVOT Vmean:  89.600 cm/s LVOT VTI:    0.218 m  AORTA Ao Root diam: 3.20 cm Ao Asc diam:  3.80 cm MITRAL VALVE MV Area (PHT): 3.93 cm    SHUNTS MV Decel Time: 193 msec    Systemic VTI:  0.22 m MV E velocity: 90.10 cm/s  Systemic Diam: 1.90 cm MV A velocity: 59.70 cm/s MV E/A ratio:  1.51 Charlton Haws MD Electronically signed by Charlton Haws MD Signature Date/Time: 01/14/2023/2:53:08 PM    Final      DG CHEST PORT 1 VIEW    Result Date: 01/14/2023  CLINICAL DATA:  Respiratory failure EXAM: PORTABLE CHEST 1 VIEW COMPARISON:  CXR 01/12/23 FINDINGS: Right arm PICC with the tip at the cavoatrial junction. There are persistent bilateral patchy airspace opacities, not significantly changed from prior exam, that remain worrisome for infection. No pleural effusion. No pneumothorax. No radiographically apparent displaced rib fractures. Visualized upper abdomen is unremarkable. IMPRESSION: No significant change in bilateral patchy airspace opacities, which remain worrisome for infection. Improved bilateral pleural effusions. Electronically Signed   By:  Lorenza Cambridge M.D.   On: 01/14/2023 07:53     DG Chest Port 1 View    Result Date: 01/12/2023  CLINICAL DATA:  Pneumonia. EXAM: PORTABLE CHEST 1 VIEW COMPARISON:  Radiograph yesterday. CT 01/10/2023 FINDINGS: Significant patient rotation on the current exam, lung volumes are low. Tip of the right upper extremity PICC at the atrial caval junction. Heterogeneous bilateral lung opacities with equivocal worsening from yesterday. Small bilateral pleural effusions. No pneumothorax. Stable heart size and mediastinal contours allowing for rotation and low lung volumes. IMPRESSION: 1. Equivocal worsening of bilateral lung opacities since yesterday. 2. Small bilateral pleural effusions. 3. Stable right upper extremity PICC. Electronically Signed   By: Narda Rutherford M.D.   On: 01/12/2023 11:33     DG CHEST PORT 1 VIEW    Result Date: 01/11/2023  CLINICAL DATA:  Hypoxia EXAM: PORTABLE CHEST 1 VIEW COMPARISON:  01/11/2023 FINDINGS: Interval placement of a right sided PICC is seen at the cavoatrial junction. Cardiac shadow is enlarged accentuated by the portable technique. Increasing central vascular congestion is noted. Multifocal airspace opacities are noted slightly increased from the prior study. This is likely related to a poor inspiratory effort. Some superimposed edema could not be totally excluded. IMPRESSION: Patchy airspace opacities bilaterally somewhat increased from the prior exam likely related to a poor  inspiratory effort. New right-sided PICC is seen in satisfactory position. Electronically Signed   By: Alcide Clever M.D.   On: 01/11/2023 21:03     DG Chest Port 1 View    Result Date: 01/11/2023  CLINICAL DATA:  42 year old male with possible aspiration pneumonia. EXAM: PORTABLE CHEST 1 VIEW COMPARISON:  Chest x-ray 01/10/2023. FINDINGS: An endotracheal tube is in place with tip 5.2 cm above the carina. A nasogastric tube is seen extending into the stomach, however, the tip of the nasogastric tube extends below the  lower margin of the image. Patchy ill-defined airspace consolidation again noted throughout the mid to lower lungs bilaterally (right greater than left), with slightly improved aeration compared with yesterday's examination. No pleural effusions. No pneumothorax. No evidence of pulmonary edema. Heart size is normal. Upper mediastinal contours are within normal limits. IMPRESSION: 1. Support apparatus, as above. 2. Multilobar bilateral pneumonia redemonstrated with slight improved aeration compared to yesterday's examination. Electronically Signed   By: Trudie Reed M.D.   On: 01/11/2023 05:49     [place] EKG SITE RITE    Result Date: 01/10/2023  If Site Rite image not attached, placement could not be confirmed due to current cardiac rhythm.    CT CHEST ABDOMEN PELVIS WO CONTRAST    Result Date: 01/10/2023  CLINICAL DATA:  Sepsis, OD/sepsis EXAM: CT CHEST, ABDOMEN AND PELVIS WITHOUT CONTRAST TECHNIQUE: Multidetector CT imaging of the chest, abdomen and pelvis was performed following the standard protocol without IV contrast. RADIATION DOSE REDUCTION: This exam was performed according to the departmental dose-optimization program which includes automated exposure control, adjustment of the mA and/or kV according to patient size and/or use of iterative reconstruction technique. COMPARISON:  Chest radiograph performed earlier on the same date. FINDINGS: CT CHEST FINDINGS Cardiovascular: No significant vascular findings. Normal heart size. No pericardial effusion. Mediastinum/Nodes: No enlarged mediastinal, hilar, or axillary lymph nodes. Thyroid gland, trachea, and esophagus demonstrate no significant findings. Endotracheal tube and feeding tube coursing below the diaphragm in the body of the stomach. Lungs/Pleura: Bilateral multifocal lung opacities prominent in the dependent portion of the bilateral lower lobes with air bronchograms consistent with multifocal pneumonia. No pleural effusion or pneumothorax. Musculoskeletal:  No chest wall mass or suspicious bone lesions identified. CT ABDOMEN PELVIS FINDINGS Hepatobiliary: No focal liver abnormality is seen. No gallstones, gallbladder wall thickening, or biliary dilatation. Pancreas: Unremarkable. No pancreatic ductal dilatation or surrounding inflammatory changes. Spleen: Normal in size without focal abnormality. Adrenals/Urinary Tract: Adrenal glands are unremarkable. Punctate nonobstructing calculus in the upper pole of the left kidney. There is also 4 mm nonobstructing calculus in the interpolar region of the right kidney. No hydronephrosis or ureteral calculus. Foley's catheter in place, urinary bladder is otherwise unremarkable. Stomach/Bowel: Stomach is within normal limits. NG tube with distal tip terminating the body of the stomach. Small bowel loops are normal in caliber. Appendix not identified, however no inflammatory changes in the pericecal region. No evidence of bowel wall thickening, distention, or inflammatory changes. Vascular/Lymphatic: No significant vascular findings are present. No enlarged abdominal or pelvic lymph nodes. Reproductive: Prostate is unremarkable. Other: No abdominal wall hernia or abnormality. No abdominopelvic ascites. Musculoskeletal: No acute or significant osseous findings. IMPRESSION: 1. Bilateral multifocal lung opacities prominent in the dependent portion of the bilateral lower lobes with air bronchograms consistent with multifocal pneumonia. Follow-up examination to resolution is recommended. 2. Endotracheal tube, feeding tube and Foley's catheter are in appropriate position. 3. No evidence of bowel obstruction. No evidence of colitis or  diverticulitis. 4. Bilateral nonobstructing renal calculi. No hydronephrosis or ureteral calculus. Electronically Signed   By: Larose Hires D.O.   On: 01/10/2023 15:16     CT Head Wo Contrast    Result Date: 01/10/2023  CLINICAL DATA:  Provided history: Mental status change, unknown cause. EXAM: CT HEAD  WITHOUT CONTRAST TECHNIQUE: Contiguous axial images were obtained from the base of the skull through the vertex without intravenous contrast. RADIATION DOSE REDUCTION: This exam was performed according to the departmental dose-optimization program which includes automated exposure control, adjustment of the mA and/or kV according to patient size and/or use of iterative reconstruction technique. COMPARISON:  No pertinent prior exams available for comparison. FINDINGS: Brain: No age advanced or lobar predominant parenchymal atrophy. 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 There is no acute intracranial hemorrhage. No demarcated cortical infarct. No extra-axial fluid collection. No evidence of an intracranial mass. No midline shift. Vascular: No hyperdense vessel. Atherosclerotic calcifications. Skull: No fracture or aggressive osseous lesion. Sinuses/Orbits: No mass or acute finding within the imaged orbits. No significant paranasal sinus disease. Other: Partially imaged life-support tubes. Secretions within the nasopharynx. IMPRESSION: 1.  No evidence of an acute intracranial abnormality. 2. Chronic right basal ganglia lacunar infarct. Electronically Signed   By: Jackey Loge D.O.   On: 01/10/2023 14:59     DG Chest Port 1 View    Result Date: 01/10/2023  CLINICAL DATA:  Intubation EXAM: PORTABLE CHEST 1 VIEW COMPARISON:  Chest radiograph 07/02/2021 FINDINGS: Endotracheal tube terminates approximately 4 cm above the carina. Enteric tube courses below diaphragm with the tip projecting over the location of the stomach and the side hole at the level of the GE junction. No pleural effusion. No pneumothorax. Normal cardiac and mediastinal contours. There are prominent bilateral perihilar airspace opacities, which could represent pulmonary edema or atypical infection. No radiographically apparent displaced rib fractures. IMPRESSION: 1. Prominent bilateral perihilar airspace opacities, which could represent  pulmonary edema or atypical infection. If the patient is immunocompromised, fungal infection is also a differential consideration. 2. Endotracheal tube terminates approximately 4 cm above the carina. 3. Enteric tube side hole at the level of the GE junction, recommend advancement by 5 cm. Electronically Signed   By: Lorenza Cambridge M.D.   On: 01/10/2023 12:04      TODAY-DAY OF DISCHARGE:    Subjective:     Artin Mceuen today has no headache,no chest abdominal pain,no new weakness tingling or numbness, feels much better wants to go home today.    Objective:     Blood pressure 106/78, pulse 65, temperature 98 F (36.7 C), temperature source Oral, resp. rate 19, SpO2 97 %.    Intake/Output Summary (Last 24 hours) at 02/02/2023 1035  Last data filed at 02/01/2023 2200  Gross per 24 hour   Intake 960 ml   Output --   Net 960 ml     There were no vitals filed for this visit.    Exam:  Awake Alert, Oriented *3, No new F.N deficits, Normal affect  NC.AT,PERRAL  Supple Neck,No JVD, No cervical lymphadenopathy appriciated.   Symmetrical Chest wall movement, Good air movement bilaterally, CTAB  RRR,No Gallops,Rubs or new Murmurs, No Parasternal Heave  +ve B.Sounds, Abd Soft, Non tender, No organomegaly appriciated, No rebound -guarding or rigidity.  No Cyanosis, Clubbing or edema, No new Rash or bruise    PERTINENT RADIOLOGIC STUDIES:  No results found.    PERTINENT LAB RESULTS:  CBC:  Recent Labs  02/01/23  0259 02/02/23  0240   WBC 5.5 6.1   HGB 12.0* 10.8*   HCT 37.3* 33.5*   PLT 359 314     CMET  CMP     Component Value Date/Time    NA 135 02/01/2023 0259    K 3.7 02/01/2023 0259    CL 105 02/01/2023 0259    CO2 23 02/01/2023 0259    GLUCOSE 174 (H) 02/01/2023 0259    BUN 9 02/01/2023 0259    CREATININE 0.87 02/01/2023 0259    CALCIUM 8.5 (L) 02/01/2023 0259    PROT 6.1 (L) 01/18/2023 0419    ALBUMIN 2.4 (L) 01/18/2023 0419    AST 40 01/18/2023 0419    ALT 121 (H) 01/18/2023 0419    ALKPHOS 82 01/18/2023 0419    BILITOT  0.8 01/18/2023 0419    GFRNONAA >60 02/01/2023 0259    GFRAA >60 02/12/2020 2120     GFR  Estimated Creatinine Clearance: 108.4 mL/min (by C-G formula based on SCr of 0.87 mg/dL).  No results for input(s): "LIPASE", "AMYLASE" in the last 72 hours.  No results for input(s): "CKTOTAL", "CKMB", "CKMBINDEX", "TROPONINI" in the last 72 hours.  Invalid input(s): "POCBNP"  No results for input(s): "DDIMER" in the last 72 hours.  No results for input(s): "HGBA1C" in the last 72 hours.  No results for input(s): "CHOL", "HDL", "LDLCALC", "TRIG", "CHOLHDL", "LDLDIRECT" in the last 72 hours.  No results for input(s): "TSH", "T4TOTAL", "T3FREE", "THYROIDAB" in the last 72 hours.    Invalid input(s): "FREET3"  No results for input(s): "VITAMINB12", "FOLATE", "FERRITIN", "TIBC", "IRON", "RETICCTPCT" in the last 72 hours.  Coags:  No results for input(s): "INR" in the last 72 hours.    Invalid input(s): "PT"  Microbiology:  Recent Results (from the past 240 hour(s))   Aerobic/Anaerobic Culture w Gram Stain (surgical/deep wound)     Status: None (Preliminary result)    Collection Time: 01/31/23  8:46 PM    Specimen: Abscess   Result Value Ref Range Status    Specimen Description ABSCESS  Final    Special Requests INDEX FINGER  Final    Gram Stain   Final     RARE WBC PRESENT, PREDOMINANTLY PMN  RARE GRAM POSITIVE COCCI     Culture   Final     MODERATE GROUP A STREP (S.PYOGENES) ISOLATED  Beta hemolytic streptococci are predictably susceptible to penicillin and other beta lactams. Susceptibility testing not routinely performed.  Performed at [facility], [address], [place],  [address]     Report Status PENDING  Incomplete     FURTHER DISCHARGE INSTRUCTIONS:    Get Medicines reviewed and adjusted:  Please take all your medications with you for your next visit with your Primary MD    Laboratory/radiological data:  Please request your Primary MD to go over all hospital tests and procedure/radiological results at the  follow up, please ask your Primary MD to get all Hospital records sent to his/her office.    In some cases, they will be blood work, cultures and biopsy results pending at the time of your discharge. Please request that your primary care M.D. goes through all the records of your hospital data and follows up on these results.    Also Note the following:  If you experience worsening of your admission symptoms, develop shortness of breath, life threatening emergency, suicidal or homicidal thoughts you must seek medical attention immediately by calling 911 or calling your  MD immediately  if symptoms less severe.    You must read complete instructions/literature along with all the possible adverse reactions/side effects for all the Medicines you take and that have been prescribed to you. Take any new Medicines after you have completely understood and accpet all the possible adverse reactions/side effects.     Do not drive when taking Pain medications or sleeping medications (Benzodaizepines)    Do not take more than prescribed Pain, Sleep and Anxiety Medications. It is not advisable to combine anxiety,sleep and pain medications without talking with your primary care practitioner    Special Instructions: If you have smoked or chewed Tobacco  in the last 2 yrs please stop smoking, stop any regular Alcohol  and or any Recreational drug use.    Wear Seat belts while driving.    Please note:  You were cared for by a hospitalist during your hospital stay. Once you are discharged, your primary care physician will handle any further medical issues. Please note that NO REFILLS for any discharge medications will be authorized once you are discharged, as it is imperative that you return to your primary care physician (or establish a relationship with a primary care physician if you do not have one) for your post hospital discharge needs so that they can reassess your need for medications and monitor your lab values.    Total Time  spent coordinating discharge including counseling, education and face to face time equals greater than 30 minutes.    SignedJeoffrey Massed  02/02/2023  10:35 AM      Electronically signed by Maretta Bees, MD at 02/02/2023 10:35 AM EDT

## 2023-02-02 NOTE — Progress Notes (Signed)
Formatting of this note might be different from the original.  This RN reviewed discharge instructions and answered all questions. Advised patient to pick up medications at home pharmacy & that he has follow-up appt tomorrow morning. Patient verbalized understanding. All belongings with patient before leaving unit. Removed PIV. Escorted patient off unit via wheelchair directly to his car without incident.   Electronically signed by Lisa Roca, RN at 02/02/2023 10:33 AM EDT

## 2023-02-03 ENCOUNTER — Other Ambulatory Visit: Payer: Self-pay | Admitting: Nurse Practitioner

## 2023-02-03 MED ORDER — OLANZAPINE 10 MG PO TBDP: 10.0000 mg | ORAL_TABLET | Freq: Every day | ORAL | 1 refills | Status: DC

## 2023-02-03 NOTE — Progress Notes (Signed)
Medicaid only covers Olanzapine ODT or disintegrating formulation.

## 2023-02-03 NOTE — Progress Notes (Signed)
Formatting of this note might be different from the original.  Medicaid only covers Olanzapine ODT or disintegrating formulation.  Electronically signed by Russella Dar, NP at 02/03/2023 10:50 AM EDT

## 2023-02-04 DIAGNOSIS — F29 Unspecified psychosis not due to a substance or known physiological condition: Secondary | ICD-10-CM

## 2023-02-04 DIAGNOSIS — F15159 Other stimulant abuse with stimulant-induced psychotic disorder, unspecified: Principal | ICD-10-CM

## 2023-02-05 ENCOUNTER — Inpatient Hospital Stay
Admit: 2023-02-05 | Discharge: 2023-02-15 | Disposition: A | Discharge status: Other Acute Inpatient Hospital | Payer: PRIVATE HEALTH INSURANCE | Source: Other Acute Inpatient Hospital | Attending: Psychiatry | Admitting: Psychiatry

## 2023-02-05 LAB — AEROBIC/ANAEROBIC CULTURE W GRAM STAIN (SURGICAL/DEEP WOUND)

## 2023-02-05 MED ORDER — DIPHENHYDRAMINE HCL 50 MG/ML IJ SOLN
50 MG/ML | INTRAMUSCULAR | Status: DC | PRN
Start: 2023-02-05 — End: 2023-02-15

## 2023-02-05 MED ORDER — TRAZODONE HCL 50 MG PO TABS
50 MG | Freq: Every evening | ORAL | Status: DC | PRN
Start: 2023-02-05 — End: 2023-02-15

## 2023-02-05 MED ORDER — HALOPERIDOL 5 MG PO TABS
5 MG | ORAL | Status: DC | PRN
Start: 2023-02-05 — End: 2023-02-15

## 2023-02-05 MED ORDER — HYDROXYZINE HCL 50 MG PO TABS
50 MG | Freq: Three times a day (TID) | ORAL | Status: DC | PRN
Start: 2023-02-05 — End: 2023-02-15
  Administered 2023-02-05 – 2023-02-15 (×4): 50 mg via ORAL

## 2023-02-05 MED ORDER — ACETAMINOPHEN 325 MG PO TABS
325 MG | ORAL | Status: DC | PRN
Start: 2023-02-05 — End: 2023-02-15
  Administered 2023-02-05 – 2023-02-06 (×3): 650 mg via ORAL

## 2023-02-05 MED ORDER — CEPHALEXIN 250 MG PO CAPS
250 MG | Freq: Four times a day (QID) | ORAL | Status: DC
Start: 2023-02-05 — End: 2023-02-13
  Administered 2023-02-05 – 2023-02-13 (×21): 500 mg via ORAL

## 2023-02-05 MED ORDER — HALOPERIDOL LACTATE 5 MG/ML IJ SOLN
5 MG/ML | INTRAMUSCULAR | Status: DC | PRN
Start: 2023-02-05 — End: 2023-02-15

## 2023-02-05 MED ORDER — ALUM & MAG HYDROXIDE-SIMETH 200-200-20 MG/5ML PO SUSP
200-200-205 MG/5ML | Freq: Four times a day (QID) | ORAL | Status: DC | PRN
Start: 2023-02-05 — End: 2023-02-15

## 2023-02-05 MED ORDER — POLYETHYLENE GLYCOL 3350 17 G PO PACK
17 g | Freq: Every day | ORAL | Status: DC | PRN
Start: 2023-02-05 — End: 2023-02-15

## 2023-02-05 MED FILL — ACETAMINOPHEN 325 MG PO TABS: 325 MG | ORAL | Qty: 2

## 2023-02-05 MED FILL — CEPHALEXIN 250 MG PO CAPS: 250 MG | ORAL | Qty: 2

## 2023-02-05 MED FILL — HYDROXYZINE HCL 50 MG PO TABS: 50 MG | ORAL | Qty: 1

## 2023-02-05 NOTE — Group Note (Signed)
Group Therapy Note    Date: 02/05/2023    Group Start Time: 1345  Group End Time: 1430  Group Topic: Process Group - Inpatient    [facility]    Kyomi Hector        Group Therapy Note    Attendees: 5/5    Writer facilitated an inpatient processing group. Writer had patients engage in an expressive arts activity in which patients were asked to write a postcard to someone they wanted to express something to with a choice to send or not send. Writer had patients discuss discharge plans, express feelings, etc. Writer held the space as patients processed. Writer concluded session with a grounding exercise and encouraging patients to provide input and feedback regarding the group.         Pt invited and encouraged to attend groups but chose not to attend.     Signature:  Serena Croissant

## 2023-02-05 NOTE — Behavioral Health Treatment Team (Signed)
BEHAVIORAL HEALTH GROUP NOTE FOR NON ATTENDEES        DATE: 02/05/2023      GROUP START: 1000    GROUP END: 1045          GROUP TYPE: Community Meeting        ATTENDANCE: Not appropriate for group due to sympton severity          SIGNATURE:  Inez Pilgrim

## 2023-02-05 NOTE — H&P (Signed)
INITIAL PSYCHIATRIC EVALUATION            IDENTIFICATION:    Patient Name  Lucas Hicks   Date of Birth [birth year]   CSN [id number]   Medical Record Number  [id number]      Age  42 y.o.   PCP No primary care provider on file.   Admit date:  02/04/2023    Room Number  105/01  @ [facility]   Date of Service  02/05/2023            HISTORY         REASON FOR HOSPITALIZATION:  CC: Per chart review patient admitted under TDO for psychosis.   HISTORY OF PRESENT ILLNESS:    The patient, Lucas Hicks, is a 42 y.o.  White (non-Hispanic) male with a past psychiatric history significant for major depressive disorder, generalized anxiety disorder, stimulant/cocaine use disorder, opioid use disorder and alcohol use disorder admitted to [facility] under TDO for bizarre behavior/psychosis.  Patient is drowsy and was not able to participate in the interview.  Most of the information is per chart review.    Per chart review patient was brought to [facility] by EMS after he was found in the middle of the street taking his clothes off.  Patient's drug screen is also positive for cocaine and benzos.   ALLERGIES:   Allergies   Allergen Reactions    Amoxicillin       MEDICATIONS PRIOR TO ADMISSION:   Prior to Admission medications    Not on File      Past psychiatric history:  Per chart review patient was diagnosed with major depressive disorder, generalized anxiety disorder, stimulant use disorder, opioid use disorder and alcohol use disorder.  Unable to obtain details related to past psychiatric hospitalizations or suicide attempts.  Patient was on Adderall, Valium, Lamictal and Wellbutrin in the past.    Substance use history:  Per chart review patient is a former smoker.  Patient with a history of alcohol dependence.  Patient's drug screen is positive for cocaine and benzos.  Patient with history of opioid and cocaine dependence.     PAST MEDICAL HISTORY:   Past Medical  History:   Diagnosis Date    Abscess     Right  index finger, per records pt undergone   incision & drainage  and was discharged home on 28th with prescription for Ceftin, Per [facility] ED rn verball report pton Ceftin 500mg  BID    Asthma     CHF (congestive heart failure) (HCC)     Chronic depression     Scar     surgical scar on Right knee area    Schizophrenia (HCC)     per prescreen pt has hx of schizophrenia diagnosis    Tattoos     tattoos distributed on upper back, Right nec, left head behind ear, left forarm, Right forarm, Right arm circular tattoe   No past surgical history on file.   SOCIAL HISTORY:   Unable to obtain     FAMILY HISTORY: History reviewed.   Family History   Family history unknown: Yes       REVIEW OF SYSTEMS:   Pertinent items are noted in the History of Present Illness.            MENTAL STATUS EXAM & VITALS     MENTAL STATUS EXAM (MSE):      Patient is drowsy and not able  to participate in the interview.         VITALS:     Patient Vitals for the past 24 hrs:   Temp Pulse Resp BP   02/05/23 0005 97.8 F (36.6 C) 79 16 131/81     Wt Readings from Last 3 Encounters:   02/05/23 72.5 kg (159 lb 13.3 oz)     Temp Readings from Last 3 Encounters:   02/05/23 97.8 F (36.6 C) (Temporal)     BP Readings from Last 3 Encounters:   02/05/23 131/81     Pulse Readings from Last 3 Encounters:   02/05/23 79            DATA     LABORATORY DATA:  Labs Reviewed - No data to display  No results found for any previous visit.                   MEDICATIONS     CURRENT MEDICATIONS:   Current Facility-Administered Medications   Medication Dose Route Frequency Provider Last Rate Last Admin    acetaminophen (TYLENOL) tablet 650 mg  650 mg Oral Q4H PRN Arman Loy R, MD   650 mg at 02/05/23 0243    polyethylene glycol (GLYCOLAX) packet 17 g  17 g Oral Daily PRN Shatarra Wehling, Cherlyn Labella R, MD        hydrOXYzine HCl (ATARAX) tablet 50 mg  50 mg Oral TID PRN Andrew Au R, MD   50 mg at 02/05/23 0243     haloperidol (HALDOL) tablet 5 mg  5 mg Oral Q4H PRN Adda Stokes R, MD        Or    haloperidol lactate (HALDOL) injection 5 mg  5 mg IntraMUSCular Q4H PRN Rayyan Burley R, MD        diphenhydrAMINE (BENADRYL) injection 50 mg  50 mg IntraMUSCular Q4H PRN Vaishnavi Dalby R, MD        traZODone (DESYREL) tablet 50 mg  50 mg Oral Nightly PRN Ansleigh Safer R, MD        aluminum & magnesium hydroxide-simethicone (MAALOX) 200-200-20 MG/5ML suspension 30 mL  30 mL Oral Q6H PRN Makaria Poarch, Cyndi Lennert, MD                     ASSESSMENT & PLAN        The patient, Lucas Hicks, is a 42 y.o.  male who presents at this time for treatment of the following diagnoses:  Based on the information available patient's diagnosis is most likely     principal Problem:    Psychosis, unspecified psychosis type (HCC)  Major depressive disorder by history  Generalized anxiety disorder by history  Stimulant/cocaine use disorder severity unspecified  History of opiate use disorder  Rule out sedative-hypnotic use disorder      Patient admitted under a TDO for disorganized behaviors.  Likely secondary to substance abuse.      Plan:   Reassess the patient once patient is more alert and cooperative  Suicide precautions  Restarted on home medications  Monitor for withdrawal symptoms  Obtain collateral information  Medicine consult  Pending commitment hearing              ESTIMATED LENGTH OF STAY:    5 to 7 days       STRENGTHS:  Able to ambulate    Weakness:  ?  Substance abuse  SIGNED:    Phoebe Sharps MD Psychiatry

## 2023-02-05 NOTE — Care Coordination-Inpatient (Signed)
02/05/23 1323   ITP   Date of Plan 02/05/23   Date of Next Review 02/12/23   Primary Diagnosis Code Psychosis, unspecified   Barriers to Treatment Client resistance;Psychiatric symptom (comment);Need for psychoeducation   Strengths Incorporated in Plan Acknowledging need for assistance;Community supports;Family supports;Natural supports;Postive outlook;Seeking interactions   Plan of Care   Long Term Goal (LTG) Stated in patient/guardian terms On PSA   Short Term Goal 1   Short Term Goal 1 Client will maintain compliance with medication regime   Baseline Functioning Unknown   Target TBD   Objectives Client will participate in individual therapy;Client will participate in group therapy   Intervention 1 Monitor medications   Frequency Daily   Measured by Behavioral data;Self report;Staff observation   Staff Responsible [facility] staff   STG Goal 1 Status: Patient Appears to be  Treatment plan goal is unmet at this time   Short Term Goal 2   Short Term Goal 2 Client will learn and demonstrate improved self management skills   Baseline Functioning Unknown   Target TBD   Objectives Client will participate in individual therapy;Client will participate in group therapy   Intervention 1 Acknowledge client strengths   Frequency Daily   Measured by Behavioral data;Self report;Staff observation   Staff Responsible [facility] staff;Clinical staff   Intervention 2 Group therapy   Frequency Daily   Measured by Behavioral data;Self report;Staff observation   Staff Responsible [facility] staff;Clinical staff   Intervention 3 Referral to community services   Frequency Weekly   Measured by Behavioral data;Self report;Staff observation   Staff Responsible [facility] staff;Clinical staff   STG Goal 2 Status: Patient Appears to be  Treatment plan goal is unmet at this time   Crisis/Safety/Discharge Plan   Crisis/Safety Plan Standard program interventions and protocol   Discharge Plan TBD

## 2023-02-05 NOTE — Behavioral Health Treatment Team (Signed)
Dr. Bernadette Hoit notified of patient's home medications:  Lamictal 200mg  once a day and zyprexa 10mg  daily.  Patient is a poor historian and unable to reliably identify when he last took his home medications.  Pt is not oriented to date or place.  Pt is able to report a history of mood disorder.  Currently denies all substance abuse despite UDS positive for benzos and cocaine.  Due to patient's state of confusion, Dr. Bernadette Hoit will review the medications in the morning.

## 2023-02-05 NOTE — Behavioral Health Treatment Team (Signed)
BH ADMISSION NOTE      TDO arrived at  0000  02/05/2023    Pt. Arrived on the unit at approx: 0000   02/05/2023 , escorted by [employer] and  two Financial risk analyst  via Via wheelchair.      From [facility] ED [place]      Pt. Awake. Pt arrived on wheel chair dressed in  blue  paper scrubs form transferring Hospital , pt came with wrap dressing  and volar splin on Right index finger ( per records pt was seen at  [facility] and admitted on Friday for an abscess to Right hand,  Per records  and per ED Rnverbal reported pt was undergone incision and drainage  and was  discharged home  om the 28th with prescription for Ceftin, Per [facility] ED Rn verbal report pt on Ceftin 500mg  po BID and he will give him his  Night time dose before leaving to our facility,).     Upon admission pt  was alert and oriented x 3  but sleepy, he denies SI.HI  denies A/V hallucinations, denies alcohol/drug and Tobacco use. Pt walked with steady gait to his room by 0015 and there changed to our unit scrubs as he was sleepy after that he laid in bed and slept by 0045.      Admission Type:   Admission Type: Involuntary    Reason for admission:   Reason for Admission: Pt said"  I went to clinic treated and relased" per [facility] records : pt was brought to [facility] by EMS after being found in the middle of the street taking  his clothes off. ERT petioned for an ECO due to capacity concerns".      Addictive Behavior:   Addictive Behavior  In the Past 3 Months, Have You Felt or Has Someone Told You That You Have a Problem With  : Other (comment) (unknown)      Medical Problems:   Past Medical History:   Diagnosis Date    Abscess     Right  index finger, per records pt undergone   incision & drainage  and was discharged home on 28th with prescription for Ceftin, Per [facility] ED rn verball report pton Ceftin 500mg  BID    Asthma     CHF (congestive heart failure) (HCC)     Chronic depression     Scar     surgical scar on Right  knee area    Schizophrenia (HCC)     per prescreen pt has hx of schizophrenia diagnosis    Tattoos     tattoos distributed on upper back, Right nec, left head behind ear, left forarm, Right forarm, Right arm circular tattoe         Psych History:  yes, hx chronic depression, Hx schizophrenia      Patient is not, a smoker.   If so, Nicotine patch ordered? No     Patient does not drink Alcohol.      Patient denies the  use Recreational substances or Street Drugs. But  per records has HX of drug abuse and  his UDS was positive for cocaine and Benzo     Status EXAM:  Mental Status and Behavioral Exam  Normal: No  Level of Assistance: Independent/Self  Facial Expression: Avoids Gaze (sleepy)  Affect: Blunt  Level of Consciousness: Alert  Frequency of Checks: 4 times per hour, close  Mood:Normal: No  Mood: Empty  Motor Activity:Normal:  Yes  Eye Contact: Poor (pt was sleepy)  Observed Behavior: Friendly, Cooperative, Other (comment) (sleepy)  Sexual Misconduct History: Current - no  Preception: Orient to person, Orient to place, Orient to time  Attention:Normal: No  Attention: Others (comment) (pt is sleepy)  Thought Processes: Unremarkable  Thought Content:Normal: Yes  Depression Symptoms: No problems reported or observed.  Anxiety Symptoms: No problems reported or observed.  Mania Symptoms: No problems reported or observed.  Hallucinations: Other (comment) (pt denies)  Delusions: No  Memory:Normal: Yes  Memory: Other (comment) (pt sleepy)  Insight and Judgment: No  Insight and Judgment: Poor judgment, Poor insight    Pt admitted with followings belongings:  Dental Appliances: None  Vision - Corrective Lenses: None  Hearing Aid: None  Jewelry: None  Body Piercings Removed: No  Clothing: Belt, Pants, Shirt, Slippers, Undergarments (1 brown belt, blue jeans 1, blue Tshirt 1,  gary slipper 1 , navy blue boxer 1)  Other Valuables: Wallet, Keys, Other (Comment) (one brown wallet with social sec card, faded photocopy of  [place]  driving license,  Towing vehicle receipt paper , one small key)      Valuables placed in safe in security envelope, number:  [id number]. Patient belongings, locked in locker on unit. Valuables left with Patient at bedside None.  Patient's did not have home medications.       Patient oriented to surroundings and program expectations and copy of patient rights given.         Received admission packet:  yes.     Consents reviewed, signed during Registration Process. . Patient verbalized understanding:  Yes.       Patient oriented on TDO Process ( if Involuntary) yes.        Patient checked for contraband none found.        Skin Assessment Completed.   Patient changed into Youth worker.     Patient oriented to Unit, Peers, Staff, and [employer].   Handbook explained, signed for and Copy given to patient with Personal ID Code.      Covid vaccine: pt reported bing vaccinated with at least one dose.                  Medical Consult for hopitalist : Dr Hazle Quant ordered, to be in formed day time.     Joslyn Hy, RN

## 2023-02-05 NOTE — Behavioral Health Treatment Team (Signed)
B: Pt is alert and oriented to self. Pt is confused, poor historian, unable to reliably provide information regarding his medication, hospitalization, or personal history. Pt is cooperative with assessments, but unable to answer most questions.  For example Pt denied all substance use, then reported a history of substance abuse and rehabilitation, then reported use "more recent than not" but could not say if it had been days, weeks, months or years.  Pt reports feeling "good".  Pt was unable to identify why they are here, or even what happened yesterday.  Pt identifies they're doing same since their admission because he was admitted today.  Pt is not able to identify personal coping alternatives.  No s/s of distress noted.  No complaints of pain.   I: Assess Pt mood.  Document presence of depressive/anxiety symptoms.  Assess for Audio/visual hallucinations.  Establish trust.  Educate Pt on medications and disease processes.  Monitor ADLs, food/fluid consumption and sleep.  Encouarge group participation and socialization.  Educate Pt on medications, coping alternatives, disease processes, and community resources.  Safety checks.  R: Pt mood is stable. Pt rates depression 0/10, identifies triggers as none.  Pt rates anxiety at 0/10, identifies triggers as none.  Pt denies SI.  Pt denies HI.  Pt denies audio/visual hallucinations, s/s are observed by staff.  Pt referred to "Gunnar Fusi" multiple times during his wound change, when asked who Gunnar Fusi was, he became irritated "you don't know who Gunnar Fusi is? I guess I'd forget my best friends name too".   Pt is not attending groups and is not interacting socailly with staff/peers.  Pt is eating some meals, and is maintaining their personal hygiene.  Pt reports sleeping well.  Pt is compliant wiith medications.  P:  Encourage patient to be open with staff.

## 2023-02-05 NOTE — Progress Notes (Signed)
Dsg changed to right index finger no drainage skin pink irrigated with sterile saline pat with betadine applies zero foam dsg and  sterile guaze wrap in kling.

## 2023-02-05 NOTE — Behavioral Health Treatment Team (Signed)
TREATMENT TEAM COMPLETED     Attending meeting was as follows:  Patient, Lucas Hicks, SW, Writer, and Dr. Bernadette Hoit.       Meeting was conducted via Surveyor, mining Team consists of the following:     Pt. Cognitive status:   Sleeping, declined appointment    Pt. Attending Groups: No    Pt. Participating in groups:  No    Pt. Homicidal / Suicidal: declined to participate    Pt. Behaviors:  Withdrawn    Pt. Compliant with Medications:  no prescribed scheduled medications at this time          New Medication orders:  No      Discharge Plan:  Home

## 2023-02-05 NOTE — Plan of Care (Signed)
Problem: Pain  Goal: Verbalizes/displays adequate comfort level or baseline comfort level  Outcome: Progressing     Problem: Skin/Tissue Integrity  Goal: Absence of new skin breakdown  Description: 1.  Monitor for areas of redness and/or skin breakdown  2.  Assess vascular access sites hourly  3.  Every 4-6 hours minimum:  Change oxygen saturation probe site  4.  Every 4-6 hours:  If on nasal continuous positive airway pressure, respiratory therapy assess nares and determine need for appliance change or resting period.  Outcome: Progressing     Problem: Psychosis  Goal: Will report no hallucinations or delusions  Description: INTERVENTIONS:  1. Administer medication as  ordered  2. Assist with reality testing to support increasing orientation  3. Assess if patient's hallucinations or delusions are encouraging self harm or harm to others and intervene as appropriate  Outcome: Progressing     Problem: Sleep Disturbance  Goal: Will exhibit normal sleeping pattern  Description: INTERVENTIONS:  1. Administer medication as ordered  2. Decrease environmental stimuli, including noise, as appropriate  3. Discourage social isolation and naps during the day  Outcome: Progressing

## 2023-02-05 NOTE — Consults (Addendum)
Hospitalist Consult Note             Chief Complaints:   Psychosis      Subjective:     Lucas Hicks is a 42 y.o. male followed by No primary care provider on file. and  has a past medical history of right index finger abscess, Asthma, CHF (congestive heart failure) (HCC), Chronic depression, Scar, Schizophrenia (HCC), and Tattoos, cocaine induced psychotic disorder, cocaine abuse, history of hepatitis C, general anxiety disorder  Patient awake and alert but not oriented and unable to give any history as most of the questions he answers by shaking his head that he does not know.  On chart review it is noted that on 4/5 he was found unresponsive, apneic with pinpoint pupils in hotel room was given Narcan and soon after noted that he became combative and unable to be redirected so was intubated for airway protection.  Was treated on chest x-ray showing bilateral patchy airspace disease.  During that time was noted to be in acute renal failure with BUN/creatinine of 37/3.86 and marked elevated LFTs with AST of 2316 and ALT of 2154 but repeat labs prior to arrival here shows resolution of acute renal failure with also normal noted normal LFTs.  Urine drug screen continues to show positive for cocaine and benzodiazepines.  He was admitted recently to [facility] for right finger erythema/abscess and I&D performed on 4/26 by Dr. Merlyn Lot and culture noted as group A strep so was discharged on cefadroxil 500mg  oral 2 tabs daily x 7 days.  During this hospitalization was also noted to have paranoid delusions felt most likely to be substance abuse related and psychiatry consultation at that time recommended outpatient follow-up and noted decreasing Lamictal to 100mg  and starting Zyprexa 10 mg at night but discharge med list notes Lamictal dose at 200 mg along with Zyprexa 10 mg but unsure what patient has been taking since recent discharge.  Discharge med list also notes oxycodone 5 mg IR to take every 6  hours as needed for moderate pain.  Patient following discharge presents to [facility] emergency room with once again reports of altered mental state and the patient was removing all his clothes and was acting bizarre.  History from chart review of patient's presentation at [facility] emergency room noted that patient stated he was taken off Lamictal and then noted that he was unable to sleep for days and feels like his bipolar is out of control. On recent discharge, patient discharge meds not lamictal at 200mg  and zyprexa of 10mg  nightly.   While in our BHU patient awake alert and flat affect very soft-spoken but unable to give any details of what may have happened.  He is unable to give further details about his finger infection and abscess.  UDS continues to be positive for cocaine and benzos but rest of lab studies were within normal limits.  After further evaluation patient was referred for admission to our behavioral health unit for further evaluation and treatment of his underlying schizophrenia and issues with substance abuse paranoia.       Past Medical History:   Diagnosis Date    Abscess     Right  index finger, per records pt undergone   incision & drainage  and was discharged home on 28th with prescription for Ceftin, Per [facility] ED rn verball report pton Ceftin 500mg  BID    Asthma     CHF (congestive heart failure) (HCC)     Chronic  depression     Scar     surgical scar on Right knee area    Schizophrenia (HCC)     per prescreen pt has hx of schizophrenia diagnosis    Tattoos     tattoos distributed on upper back, Right nec, left head behind ear, left forarm, Right forarm, Right arm circular tattoe      No past surgical history on file.  Family History   Family history unknown: Yes      Social History     Tobacco Use    Smoking status: Never    Smokeless tobacco: Never    Tobacco comments:     Pt non smoker   Substance Use Topics    Alcohol use: Never       Prior to Admission medications    Medication  Sig Start Date End Date Taking? Authorizing Provider   OLANZapine zydis (ZYPREXA) 10 MG disintegrating tablet Take 1 tablet by mouth nightly   Yes [provider]   lamoTRIgine (LAMICTAL) 200 MG tablet Take 1 tablet by mouth at bedtime   Yes [provider]   alfuzosin (UROXATRAL) 10 MG extended release tablet Take 1 tablet by mouth daily   Yes [provider]   albuterol sulfate HFA (VENTOLIN HFA) 108 (90 Base) MCG/ACT inhaler Inhale 2 puffs into the lungs every 6 hours as needed for Wheezing   Yes [provider]     Allergies   Allergen Reactions    Amoxicillin         Review of Systems:  Review of Systems   Constitutional: Negative.    HENT: Negative.     Eyes: Negative.    Respiratory: Negative.     Cardiovascular: Negative.    Gastrointestinal: Negative.    Endocrine: Negative.    Genitourinary: Negative.    Musculoskeletal: Negative.    Skin: Negative.    Allergic/Immunologic: Negative.    Neurological: Negative.    Hematological: Negative.    Psychiatric/Behavioral: Negative.     All other systems reviewed and are negative.         Objective:     Vitals:  BP 131/81   Pulse 79   Temp 97.8 F (36.6 C) (Temporal)   Resp 16   Ht 1.75 m (5' 8.9")   Wt 72.5 kg (159 lb 13.3 oz)   BMI 23.67 kg/m     Physical Exam:  General: Alert, cooperative, no distress. Flat affect.,   Head:  Normocephalic, without obvious abnormality, atraumatic.  Eyes:  Conjunctivae/corneas clear. Pupils equal, round, reactive to light. Extraocular movements intact.  Lungs:  Clear to auscultation bilaterally, no wheezes, crackles  Chest wall: No tenderness or deformity.  Heart:  Regular rate and rhythm, S1, S2 normal, no murmur, click, rub, or gallop.  Abdomen:   Soft, non-tender. Bowel sounds normal. No masses. No organomegaly.  Back:  No spine tenderness to palpation  Extremities: Extremities normal, atraumatic, no cyanosis or edema.  Pulses: Symmetric all extremities.  Skin: Skin color, texture,  turgor normal.   Lymph nodes: Cervical nodes normal.  Neurologic: CNII-XII intact. Normal strength, sensation, and reflexes throughout.right index finger splint in place       Labs:  No results found for this or any previous visit (from the past 24 hour(s)).    Imaging:  No results found.     Assessment & Plan:     Psychosis  -Management as per primary psychiatric team but on chart review it was noted recent  decrease of Lamictal 100 mg and Zyprexa started at 10 mg at night as noted on recent discharge on 4/25 from [facility]  -Continue management as per primary psychiatric team    Altered mental state  -Multifactorial most likely from acute psychosis and continued cocaine use as     Right index finger cellulitis/abscess  -Status post I&D on 4/26 done by Dr. Merlyn Lot  -Continue daily wound care with Betadine and irrigated with sterile saline and sterile Xerofoam guaze and Kling wrap with continued splint  -Wound culture positive for group A strep sensitive to cephalosporins  -Continue with equivalent Cedroxil 1000mg  oral daily x 7 days and will convert to keflex 500mg  oral q6hrs x 7 days   - will need follow up appt with Dr. Betha Loa orthopedic surgeon immediately after discharge from [facility], initial follow up was noted to be on 02/03/23     Chronic cocaine abuse  -Monitor closely for withdrawals  -Recent echo done on 01/14/23 shows a preserved EF of 60 to 65% with left ventricular has normal function no regional wall motion abnormalities and no diastolic dysfunction noted    Chronic hepatitis C  -Current liver functions are in normal range unlike in recent admission on April 5 had liver functions in the 2000 range              Thank you for allowing [place] to help care for your patient please call with any questions or concerns    55 minutes evaluating and cordinating patient's consult to behavioral health unit for medical and neurological evaluation requested by Dr. Bernadette Hoit MD        Electronically signed by Darlyn Chamber, MD on 02/05/2023 at 3:37 PM

## 2023-02-05 NOTE — Progress Notes (Signed)
Pt at 0243 given po prn Tylenol 650mg  for Right index pain 10/10" pt said it is infected", also given  Atarax 50mg  for anxiety, as staff reported pt was saying " people harassing him" when I clarified that he said he hears people talking about him, afte the took medication and drank half water cup he laid in bed to sleep, prn given helpful, pt sleeping by 0300 remained sleeping as of this time.  No violent no self harming behavior noticed or reported.

## 2023-02-05 NOTE — Behavioral Health Treatment Team (Signed)
B:   Patient alert and oriented x 1.   Pt. States depression is 0/10.  Pt. States Anxiety is 0/10.  Pt. admits to Hallucinations.  Pt. has Delusions.  Pt. denies SI.  Pt. denies HI.   Pt. cooperative with Assessment.  Pt.'s behavior Cooperative.   Pt is talking to himself and giving himself cues on how to behave. Saying that he is safe here and that he needs to sleep. Says that the antibiotics are making him sick although he has no symptoms of such. Pt states that people are talking to him and and are being to loud. Reminded him at he is in the hospital and he is safe. Pt states "oh I have to remember that".Says he is tired but cannot lay down.    I:    If patient is disoriented, reorient pt.  Build trust with patient, by therapeutic listening and Groups.  Encourage pt. To attend and Participate in Groups.  Provide Medications as ordered and needed.  Encourage pt. To be up for all meals and snacks, and consume all of each. Encourage pt. To interact with staff and peers in a positive manner.  Encourage pt. To keep good hygiene.  Q 15 minute safety checks.    R:   Pt. did attend and did not Participate in Group.  Pt. Is Compliant with Medications   Yes.   Pt. is getting up for meals and snacks.  Pt. Consumes 50% of Meals.  Pt. is not, interacting with Peers.   Pt.'s hygiene is Poor.  Pt. does not, have any safety issues.    P:   Pt. Will develop and continue to utilize positive Coping skills.  Pt. Will continue to comply with Plan of Care toward Discharge.  Pt. Will continue to stay safe on the unit.

## 2023-02-05 NOTE — Group Note (Signed)
Group Therapy Note    Date: 02/05/2023    Group Start Time: 1430  Group End Time: 1515  Group Topic: Psychoeducation    [facility]    Kimani Hovis        Group Therapy Note    Attendees: 5/6    Writer facilitated an inpatient psych-ed group. Writer provided a handout regarding grief and various myths associated with the concept. Writer encouraged patients to share. Writer held the space as patients processed. Writer concluded session with a grounding exercise and encouraging patients to provide input and feedback regarding the group.        Pt invited and encouraged to attend groups but chose not to attend.     Signature:  Serena Croissant

## 2023-02-06 ENCOUNTER — Inpatient Hospital Stay: Admit: 2023-02-06 | Payer: PRIVATE HEALTH INSURANCE

## 2023-02-06 LAB — CBC WITH AUTO DIFFERENTIAL
Basophils %: 0 % (ref 0–1)
Basophils Absolute: 0 10*3/uL (ref 0.0–0.1)
Eosinophils %: 1 % (ref 0–7)
Eosinophils Absolute: 0.1 10*3/uL (ref 0.0–0.4)
Hematocrit: 39.1 % (ref 36.6–50.3)
Hemoglobin: 12.8 g/dL (ref 12.1–17.0)
Immature Granulocytes %: 0 % (ref 0–0.5)
Immature Granulocytes Absolute: 0 10*3/uL (ref 0.00–0.04)
Lymphocytes %: 38 % (ref 12–49)
Lymphocytes Absolute: 1.9 10*3/uL (ref 0.8–3.5)
MCH: 29.5 PG (ref 26.0–34.0)
MCHC: 32.7 g/dL (ref 30.0–36.5)
MCV: 90.1 FL (ref 80.0–99.0)
MPV: 9 FL (ref 8.9–12.9)
Monocytes %: 12 % (ref 5–13)
Monocytes Absolute: 0.6 10*3/uL (ref 0.0–1.0)
Neutrophils %: 49 % (ref 32–75)
Neutrophils Absolute: 2.4 10*3/uL (ref 1.8–8.0)
Nucleated RBCs: 0 PER 100 WBC
Platelets: 306 10*3/uL (ref 150–400)
RBC: 4.34 M/uL (ref 4.10–5.70)
RDW: 14.2 % (ref 11.5–14.5)
WBC: 5 10*3/uL (ref 4.1–11.1)
nRBC: 0 10*3/uL (ref 0.00–0.01)

## 2023-02-06 LAB — COMPREHENSIVE METABOLIC PANEL
ALT: 183 U/L — ABNORMAL HIGH (ref 12–78)
AST: 79 U/L — ABNORMAL HIGH (ref 15–37)
Albumin/Globulin Ratio: 0.8 — ABNORMAL LOW (ref 1.1–2.2)
Albumin: 3.2 g/dL — ABNORMAL LOW (ref 3.5–5.0)
Alk Phosphatase: 80 U/L (ref 45–117)
Anion Gap: 6 mmol/L (ref 5–15)
BUN/Creatinine Ratio: 13 (ref 12–20)
BUN: 12 mg/dL (ref 6–20)
CO2: 31 mmol/L (ref 21–32)
Calcium: 9.3 mg/dL (ref 8.5–10.1)
Chloride: 102 mmol/L (ref 97–108)
Creatinine: 0.9 mg/dL (ref 0.70–1.30)
Est, Glom Filt Rate: >90 mL/min/{1.73_m2} (ref >60)
Globulin: 4.1 g/dL — ABNORMAL HIGH (ref 2.0–4.0)
Glucose: 95 mg/dL (ref 65–100)
Potassium: 4.6 mmol/L (ref 3.5–5.1)
Sodium: 139 mmol/L (ref 136–145)
Total Bilirubin: 0.4 mg/dL (ref 0.2–1.0)
Total Protein: 7.3 g/dL (ref 6.4–8.2)

## 2023-02-06 LAB — AMMONIA: Ammonia: <10 umol/L (ref <32)

## 2023-02-06 LAB — MYOGLOBIN, BLOOD: Myoglobin: 58 ng/mL (ref 16–116)

## 2023-02-06 LAB — LACTIC ACID: Lactic Acid, Plasma: 1.2 mmol/L (ref 0.4–2.0)

## 2023-02-06 LAB — TROPONIN: Troponin, High Sensitivity: 8 ng/L (ref 0–76)

## 2023-02-06 LAB — TSH: TSH, 3rd Generation: 0.85 u[IU]/mL (ref 0.36–3.74)

## 2023-02-06 LAB — CK: Total CK: 100 U/L (ref 39–308)

## 2023-02-06 LAB — MAGNESIUM: Magnesium: 1.9 mg/dL (ref 1.6–2.4)

## 2023-02-06 MED FILL — CEPHALEXIN 250 MG PO CAPS: 250 MG | ORAL | Qty: 2

## 2023-02-06 MED FILL — ACETAMINOPHEN 325 MG PO TABS: 325 MG | ORAL | Qty: 2

## 2023-02-06 NOTE — Behavioral Health Treatment Team (Signed)
Attempted to call patient's wife at [phone], but it is disconnected.  No other numbers are available.

## 2023-02-06 NOTE — Behavioral Health Treatment Team (Signed)
PSYCHOSOCIAL ASSESSMENT  :Patient identifying info:   Lucas Hicks is a 42 y.o., male admitted 02/04/2023 11:53 PM   Pt currently lacking capacity and has minimal engagement at this time. Information gathered from previous notes. Pt had commitment hearing today. Pt was committed involuntarily for up to 30 days.    Presenting problem and precipitating factors: Pt admitted under a TDO due to psychosis. Prescreen reports that pt was found in the middle of the street with his clothes off. It was reported that pt had not slept in days. Reports on prescreen indicate that pt was on lamictal and had stopped. Hx of diagnosis of schizophrenia. Pt's labs were positive for cocaine.     Mental status assessment: Unable to assess. Pt disoriented x3. Pt is able to answer some prompts but minimal engagement.     Strengths/Recreation/Coping Skills: Unknown at this time    Collateral information: None at this time    Current psychiatric /substance abuse providers and contact info: Unknown at this time     Previous psychiatric/substance abuse providers and response to treatment: Unknown    Family history of mental illness or substance abuse: Unknown     Substance abuse history:    Social History     Tobacco Use    Smoking status: Never    Smokeless tobacco: Never    Tobacco comments:     Pt non smoker   Substance Use Topics    Alcohol use: Never       History of biomedical complications associated with substance abuse: Unknown    Patient's current acceptance of treatment or motivation for change: Pt unable to participate in treatment at this time.    Family constellation: Unknown at this time    Is significant other involved? Unknown    Describe support system: Unknown     Describe living arrangements and home environment: Unknown at this time    GUARDIAN/POA: No    Guardian Name: N/A    Guardian Contact: N/A    Health issues:     Trauma history: Unknown    Legal issues: Unknown    History of military service: Unknown    Financial  status: Unknown    Religious/cultural factors: Not voiced at this time    Education/work history: Unknown at this time    Have you been licensed as a Clinical cytogeneticist (current or expired): No    Describe coping skills: Unknown at this time.     Kweku Stankey  02/06/2023

## 2023-02-06 NOTE — Behavioral Health Treatment Team (Signed)
BEHAVIORAL HEALTH GROUP NOTE FOR NON ATTENDEES        DATE: 02/06/2023      GROUP START: 1000     GROUP END: 1045          GROUP TYPE: Community Meeting        ATTENDANCE: Not appropriate for group due to sympton severity          SIGNATURE:  Inez Pilgrim

## 2023-02-06 NOTE — Progress Notes (Signed)
Psychiatric Progress Note      Patient: Lucas Hicks MRN: [id number]  SSN: [id number]    Date of Birth: 08/13/1981  Age: 42 y.o.  Sex: male      Admit Date: 02/04/2023       Subjective:     Lucas Hicks patient continues to be drowsy.  Did not participate in the interview.  Did not eat his breakfast this morning.  Patient had some lunch yesterday.  Sleeping in his room most of the time.    Objective:     Vitals:    02/05/23 0005 02/05/23 1530   BP: 131/81 131/77   Pulse: 79 77   Resp: 16 16   Temp: 97.8 F (36.6 C) 97.7 F (36.5 C)   TempSrc: Temporal Temporal   SpO2:  99%   Weight: 72.5 kg (159 lb 13.3 oz)    Height: 1.75 m (5' 8.9")         Mental Status Exam:     Patient drowsy and not able to participate in the interview    MEDICATIONS:  Current Facility-Administered Medications   Medication Dose Route Frequency    acetaminophen (TYLENOL) tablet 650 mg  650 mg Oral Q4H PRN    polyethylene glycol (GLYCOLAX) packet 17 g  17 g Oral Daily PRN    hydrOXYzine HCl (ATARAX) tablet 50 mg  50 mg Oral TID PRN    haloperidol (HALDOL) tablet 5 mg  5 mg Oral Q4H PRN    Or    haloperidol lactate (HALDOL) injection 5 mg  5 mg IntraMUSCular Q4H PRN    diphenhydrAMINE (BENADRYL) injection 50 mg  50 mg IntraMUSCular Q4H PRN    traZODone (DESYREL) tablet 50 mg  50 mg Oral Nightly PRN    aluminum & magnesium hydroxide-simethicone (MAALOX) 200-200-20 MG/5ML suspension 30 mL  30 mL Oral Q6H PRN    cephALEXin (KEFLEX) capsule 500 mg  500 mg Oral 4 times per day        DISCUSSION:  Discussed risk and benefits of medication, provided an opportunity to answer any questions, reviewed discharge goals.    Lab/Data Review:  No results found for this or any previous visit (from the past 24 hour(s)).        Assessment:     Principal Problem:    Psychosis, unspecified psychosis type (HCC)  Resolved Problems:    * No resolved hospital problems. *    Major depressive disorder by history  Generalized anxiety disorder by  history  Stimulant/cocaine use disorder severity unspecified  History of opiate use disorder  Rule out sedative-hypnotic use disorder        Patient admitted under a TDO for disorganized behaviors.  Likely secondary to substance abuse.    Plan:   Monitor oral intake.  Check BMP and CBC.   Monitor for withdrawal symptoms  Obtain collateral information  Pending commitment hearing    Signed By: Lajuana Carry MD Psychiatry     Feb 06, 2023

## 2023-02-06 NOTE — Progress Notes (Signed)
Pt has sat on side of the bed or stood in the corner of his room all night. Has slept in this position. Staff has encouraged pt to get into bed but pt states "I can't do it" Voided in floor X 1 states he has a "weak bladder". Continues to talk to himself and guarded and suspicious of staff. Safe on unit will continue to monitor.

## 2023-02-06 NOTE — Plan of Care (Signed)
See care plan

## 2023-02-06 NOTE — Progress Notes (Signed)
Pt refused vital signs and 0600 dose of Keflex.

## 2023-02-06 NOTE — Behavioral Health Treatment Team (Addendum)
B:   Patient alert and oriented x 1.   Pt. States depression is will not say.  Pt. States Anxiety is Says "I have some".  Pt. does Hallucinations.  Pt. does Delusions.  Pt. denies SI.  Pt. denies HI.   Pt. cooperative with Assessment.  Pt.'s behavior Withdrawn and Other Sleepy*.   Poor historian and is unable to carry on a meaningful conversation.    I:    If patient is disoriented, reorient pt.  Build trust with patient, by therapeutic listening and Groups.  Encourage pt. To attend and Participate in Groups.  Provide Medications as ordered and needed.  Encourage pt. To be up for all meals and snacks, and consume all of each. Encourage pt. To interact with staff and peers in a positive manner.  Encourage pt. To keep good hygiene.  Q 15 minute safety checks.    R:   Pt. did not attend and Participate in Group.  Pt. Is Compliant with Medications   Yes.   Pt. is not getting up for meals and snacks.  Pt. Consumes 25% of Meals.  Pt. is not, interacting with Peers.   Pt.'s hygiene is Poor.  Pt. does not, have any safety issues.    P:   Pt. Will develop and continue to utilize positive Coping skills.  Pt. Will continue to comply with Plan of Care toward Discharge.  Pt. Will continue to stay safe on the unit.

## 2023-02-06 NOTE — Group Note (Signed)
Group Therapy Note    Date: 02/06/2023    Group Start Time: 1455  Group End Time: 1540  Group Topic: Psychoeducation    [facility]    Kaymarie Wynn        Group Therapy Note    Attendees: 5/6    Writer facilitated an inpatient psych-ed group. Writer provided a handout regarding coping skills and grounding exercises. Writer encouraged patients to share. Writer concluded session with a grounding exercise and encouraging patients to provide input and feedback regarding the group.        Pt symptoms too acute in nature to attend group.         Signature:  Serena Croissant

## 2023-02-06 NOTE — Group Note (Signed)
Group Therapy Note    Date: 02/06/2023    Group Start Time: 1410  Group End Time: 1455  Group Topic: Process Group - Inpatient    [facility]    Tynesia Harral        Group Therapy Note    Attendees: 5/6    Writer facilitated an inpatient processing group. Writer had patients engage in an expressive arts activity which involved paint as a way for patients to express themselves. Writer held the space as patients processed. Writer encouraged patients to discuss discharge plans, etc. Writer concluded session with a grounding exercise and encouraging patients to provide input and feedback regarding the group.        Pt unable to attend group due to symptom acuity.       Signature:  Serena Croissant

## 2023-02-06 NOTE — Progress Notes (Signed)
Dsg changed to right index finger irrigated with sterile saline pat with bedatine applied zero form dsg wrap in kling

## 2023-02-06 NOTE — Behavioral Health Treatment Team (Signed)
TREATMENT TEAM COMPLETED    Attending meeting was as follows:  Patient, Lucas Hicks, SW, Writer, and Dr. Bernadette Hoit.  Meeting was conducted via telehealth.      Daily Treatment Team consists of the following:     Pt. Cognitive status:  Awake, confused    Pt. Attending Groups: no    Pt. Participating in groups:  no    Pt. Homicidal / Suicidal: delusions    Pt. Behaviors:  Guarded/Suspicious and Withdrawn    Pt. Compliant with Medications:  Yes - occasionally          New Medication orders:  No      Discharge Plan:  Unknown at this time due to patient's altered mental status.      Patient has no idea how he came to be here.  He is A&O to person only.  He woke up at around 1150 am.  On assessment, patient could use his left arm/hand minimally only and not on command.  Patient was trying to feed himself lunch with his right hand and chewed off his dressing.

## 2023-02-06 NOTE — Progress Notes (Signed)
Nursing contacted me about patient continued lethargy. It was  noted that he may be having visual deficits. On my evaluation was resting comfortable but is incontinent of urine.  Basic labs obtained and liver functions mildly elevated with AST of 79 and an ALT of 183 and had similar levels when evaluated at [facility] emergency room with noted ALT at 122 and AST of 98.  BMP was within normal limits albumin slightly low at 3.2 and glucose was 95.  Had a slightly hypotensive blood pressure this morning but on recheck is back into normal range.  CT of the head was performed and no noted acute intracranial abnormality with no loss of gray-white differentiation to suggest late acute or early subacute infarction the ventricles are and Tonna Boehringer are appropriate in size and configuration for age.  The patient's presentation of altered mental state hallucinations and also ER notes from [facility] emergency room noted that patient has not slept in several days which patient had noted it was secondary to him being off Lamictal.  Patient's wife Cubby Quillan [phone] was mentioned and ER notes of wanting him to have psychiatric evaluation.  At this time continue to monitor with neurochecks and monitor for any withdrawal symptoms and avoid sedating medications and will reevaluate for any improvement in a.m. tomorrow. Currently resting well and would allow him to get his sleep for now.   Would like UA and UC obtained to evaluate for possible UTI but UA done at [facility] ED was negative for infection.

## 2023-02-06 NOTE — Behavioral Health Treatment Team (Signed)
BEHAVIORAL HEALTH GROUP NOTE FOR NON ATTENDEES        DATE: 02/06/2023      GROUP START: 2000    GROUP END: 2045          GROUP TYPE: Wrap-Up        ATTENDANCE: Not appropriate for group due to sympton severity          SIGNATURE:  Shelby Dubin, CNA       Pt. Is not stable to attend group delusional and very unstable

## 2023-02-07 LAB — URINALYSIS WITH REFLEX TO CULTURE
Blood, Urine: NEGATIVE
Glucose, Ur: NEGATIVE mg/dL
Ketones, Urine: 40 mg/dL — AB
Leukocyte Esterase, Urine: NEGATIVE
Nitrite, Urine: NEGATIVE
Specific Gravity, UA: >1.03 — ABNORMAL HIGH (ref 1.003–1.030)
Urobilinogen, Urine: 0.2 EU/dL (ref 0.2–1.0)
pH, Urine: 6 (ref 5.0–8.0)

## 2023-02-07 LAB — LIPID PANEL
Chol/HDL Ratio: 3.8 (ref 0.0–5.0)
Cholesterol, Total: 143 mg/dL (ref <200)
HDL: 38 mg/dL
LDL Cholesterol: 88.4 mg/dL (ref 0–100)
Triglycerides: 83 mg/dL (ref <150)
VLDL Cholesterol Calculated: 16.6 mg/dL

## 2023-02-07 LAB — EKG 12-LEAD
Atrial Rate: 73 {beats}/min
P Axis: -3 degrees
P-R Interval: 142 ms
Q-T Interval: 417 ms
QRS Duration: 97 ms
QTc Calculation (Bazett): 460 ms
R Axis: 83 degrees
T Axis: 61 degrees
Ventricular Rate: 73 {beats}/min

## 2023-02-07 LAB — HEMOGLOBIN A1C
Estimated Avg Glucose: 108 mg/dL
Hemoglobin A1C: 5.4 % (ref 4.0–5.6)

## 2023-02-07 LAB — URINE DRUG SCREEN
Amphetamine, Urine: NEGATIVE
Barbiturates, Urine: NEGATIVE
Benzodiazepines, Urine: POSITIVE — AB
Cocaine, Urine: POSITIVE — AB
MDMA, Urine: NEGATIVE
Methadone, Urine: NEGATIVE
Opiates, Urine: NEGATIVE
Phencyclidine, Urine: NEGATIVE
THC, TH-Cannabinol, Urine: NEGATIVE

## 2023-02-07 LAB — VITAMIN B12 & FOLATE
Folate: 23.2 ng/mL — ABNORMAL HIGH (ref 5.0–21.0)
Vitamin B-12: 1387 pg/mL — ABNORMAL HIGH (ref 193–986)

## 2023-02-07 LAB — BILIRUBIN, CONFIRMATORY
Bilirubin Confirmation, UA: NEGATIVE
Specific Gravity, UA: >1.03 — ABNORMAL HIGH (ref 1.003–1.030)

## 2023-02-07 MED FILL — CEPHALEXIN 250 MG PO CAPS: 250 MG | ORAL | Qty: 2

## 2023-02-07 NOTE — Progress Notes (Signed)
Urine specimen obtained and sent to lab for UA and UDS.

## 2023-02-07 NOTE — Behavioral Health Treatment Team (Signed)
Pt did not come to group he was in his room just standing.

## 2023-02-07 NOTE — Behavioral Health Treatment Team (Signed)
B:   Patient alert and oriented x 1.   Pt. States depression is no answer.  Pt. States Anxiety is no answer.  Pt. has exhibited Hallucinations, by responding to internal stimuli noted in room.   Pt. does not display Delusions at this time, not speaking  Pt. denies SI.  Pt. denies HI.   Pt. uncooperative with Assessment, not answering questions at this time.  Pt.'s behavior Guarded/Suspicious, Agitated, Disorganized, Withdrawn, and Resistive.       I:    If patient is disoriented, reorient pt.  Build trust with patient, by therapeutic listening and Groups.  Encourage pt. To attend and Participate in Groups.  Provide Medications as ordered and needed.  Encourage pt. To be up for all meals and snacks, and consume all of each. Encourage pt. To interact with staff and peers in a positive manner.  Encourage pt. To keep good hygiene.  Q 15 minute safety checks.    R:   Pt. did not attend and Participate in Group.  Pt. Is Compliant with Medications   Refusing Psychiatric Medications and Refusing Medical Medications.   Pt. is not getting up for meals and snacks.  Pt. Consumes 0% of Meals.  Pt. is not, interacting with Peers.   Pt.'s hygiene is Fair.  Pt. does not, have any safety issues.    P:   Pt. Will develop and continue to utilize positive Coping skills.  Pt. Will continue to comply with Plan of Care toward Discharge.  Pt. Will continue to stay safe on the unit.

## 2023-02-07 NOTE — Behavioral Health Treatment Team (Signed)
Pt. Remains requiring, partial assistance with adl's, and prompts for meals and continues to refuse medications.  Pt. Speaking with tech and staff throughout the day, minimal amounts.  Pt. Sitting on bed, has not laid down.  Pt. Continues to wear briefs for incontinence.  Pt. Has been offered toileting assistance every 2 hours throughout the day, pt. Refuses to go to the bathroom.  Brief checked each time.  Pt. Continues to exhibit thought blocking.  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 Unable to recall phone number at this time.  Upon attempting the phone number that is in the chart, remains saying disconnected.

## 2023-02-07 NOTE — Behavioral Health Treatment Team (Signed)
LPN's assessment reviewed

## 2023-02-07 NOTE — Behavioral Health Treatment Team (Signed)
Staff in with patient, assisting pt. In ADL's, washing pt. Clean brief and clean clothes.  Pt. Did not help much.  Pt. Refused a shower, however, did let staff assist him in washing up and sitting in chair at his desk.  Pt. Remains refusing to get into bed, stands for a while, then sits on the floor, staff attempted to redirect pt. To get into bed or sit on the chair.  Pt. Refused.  Pt. Safe, safety rounds continue.

## 2023-02-07 NOTE — Progress Notes (Signed)
Refused vital signs and AM Keflex.

## 2023-02-07 NOTE — Behavioral Health Treatment Team (Addendum)
Pt. Sitting on floor at end of bed with no socks on , urine noted on pants and on floor under pt.  Pt. Appears thought blocking.  Pt. Alert, oriented to person.  Fair eye contact.  No socks on feet.   Feet dirty , no open areas noted.    Pt. Bandage intact to right index finder, no drainage noted on bandage.  Pt. Does not follow commands about standing up, or coming to desk in his room to try to eat.  Writer told pt. He had coffee, juice, eggs, and bacon for breakfast, Pt. States, " juice and coffee sound great".  Pt. Was makilng purposeful "Jerking"  movements, Clinical research associate asked pt. If he was okay.   Pt. Looked at Clinical research associate and did not answer.   Pt. Asked writer if he felt he was going through withdrawal.    Pt. Stated, " Please do not blame things on that".  Writer stated, okay.  Pt. Was making Several attempts were made with verbal prompts to get pt. Up, Clinical research associate offered to assist pt. In standing to go to the desk to eat, pt. Verbally aggressive, states, " No, not right now", pt. Offered pt. Juice took it to him where he was sitting, pt. Stated, " No".  Writer mentioned to pt. About getting him cleaned up and pt. States, "NO".  Pt. Remains sitting at this time on the floor.

## 2023-02-07 NOTE — Progress Notes (Signed)
Pt has been awake a good part of the night. Sitting in the floor against the wall talking softly to himself. Redirected to get into bed but became agitated and cussed at staff. Refused Keflex and stated he did not need it. Safe on unit will continue to monitor.

## 2023-02-07 NOTE — Behavioral Health Treatment Team (Signed)
TREATMENT TEAM COMPLETED     Attending meeting was as follows:  Patient,  Writer, and Dr. Bernadette Hoit.       Meeting was conducted via in person      Daily Treatment Team consists of the following:     Pt. Cognitive status:  Confused    Pt. Attending Groups: No    Pt. Participating in groups:  No    Pt. Homicidal / Suicidal: normal    Pt. Behaviors:  Guarded/Suspicious and Resistive    Pt. Compliant with Medications:  No          New Medication orders:  No      Discharge Plan:  unknown, at this time.

## 2023-02-07 NOTE — Progress Notes (Signed)
Psychiatric Progress Note      Patient: [name] MRN: [id number]  SSN: [id number]    Date of Birth: 07-08-1981  Age: 42 y.o.  Sex: male      Admit Date: 02/04/2023       Subjective:     Lucas Hicks patient is sitting on the floor next to his bed.  [name] to self.  Patient did not talk to the Clinical research associate.  Continues to have poor oral intake.  Patient also had urinary incontinence.    Objective:     Vitals:    02/05/23 1530 02/06/23 1015 02/06/23 1339 02/06/23 1548   BP: 131/77 (!) 98/59 127/77 116/66   Pulse: 77 72 78 63   Resp: 16 16 16 16    Temp: 97.7 F (36.5 C) 98.6 F (37 C) 98.7 F (37.1 C) 97.8 F (36.6 C)   TempSrc: Temporal Temporal Temporal Temporal   SpO2: 99% 97% 97% 99%   Weight:       Height:            Mental Status Exam:     Patient is alert and oriented to self.  Patient did not talk to Clinical research associate.  Patient with poor personal hygiene.  Patient also appears anxious.    MEDICATIONS:  Current Facility-Administered Medications   Medication Dose Route Frequency    acetaminophen (TYLENOL) tablet 650 mg  650 mg Oral Q4H PRN    polyethylene glycol (GLYCOLAX) packet 17 g  17 g Oral Daily PRN    hydrOXYzine HCl (ATARAX) tablet 50 mg  50 mg Oral TID PRN    haloperidol (HALDOL) tablet 5 mg  5 mg Oral Q4H PRN    Or    haloperidol lactate (HALDOL) injection 5 mg  5 mg IntraMUSCular Q4H PRN    diphenhydrAMINE (BENADRYL) injection 50 mg  50 mg IntraMUSCular Q4H PRN    traZODone (DESYREL) tablet 50 mg  50 mg Oral Nightly PRN    aluminum & magnesium hydroxide-simethicone (MAALOX) 200-200-20 MG/5ML suspension 30 mL  30 mL Oral Q6H PRN    cephALEXin (KEFLEX) capsule 500 mg  500 mg Oral 4 times per day        DISCUSSION:  Discussed risk and benefits of medication, provided an opportunity to answer any questions, reviewed discharge goals.    Lab/Data Review:  Recent Results (from the past 24 hour(s))   CBC with Auto Differential    Collection Time: 02/06/23 11:40 AM   Result Value Ref Range    WBC  5.0 4.1 - 11.1 K/uL    RBC 4.34 4.10 - 5.70 M/uL    Hemoglobin 12.8 12.1 - 17.0 g/dL    Hematocrit 78.2 95.6 - 50.3 %    MCV 90.1 80.0 - 99.0 FL    MCH 29.5 26.0 - 34.0 PG    MCHC 32.7 30.0 - 36.5 g/dL    RDW 21.3 08.6 - 57.8 %    Platelets 306 150 - 400 K/uL    MPV 9.0 8.9 - 12.9 FL    Nucleated RBCs 0.0 0.0 PER 100 WBC    nRBC 0.00 0.00 - 0.01 K/uL    Neutrophils % 49 32 - 75 %    Lymphocytes % 38 12 - 49 %    Monocytes % 12 5 - 13 %    Eosinophils % 1 0 - 7 %    Basophils % 0 0 - 1 %    Immature Granulocytes % 0 0 - 0.5 %  Neutrophils Absolute 2.4 1.8 - 8.0 K/UL    Lymphocytes Absolute 1.9 0.8 - 3.5 K/UL    Monocytes Absolute 0.6 0.0 - 1.0 K/UL    Eosinophils Absolute 0.1 0.0 - 0.4 K/UL    Basophils Absolute 0.0 0.0 - 0.1 K/UL    Immature Granulocytes Absolute 0.0 0.00 - 0.04 K/UL    Differential Type AUTOMATED     Ammonia    Collection Time: 02/06/23 11:40 AM   Result Value Ref Range    Ammonia <10 <32 umol/L   Comprehensive Metabolic Panel    Collection Time: 02/06/23 11:40 AM   Result Value Ref Range    Sodium 139 136 - 145 mmol/L    Potassium 4.6 3.5 - 5.1 mmol/L    Chloride 102 97 - 108 mmol/L    CO2 31 21 - 32 mmol/L    Anion Gap 6 5 - 15 mmol/L    Glucose 95 65 - 100 mg/dL    BUN 12 6 - 20 mg/dL    Creatinine 1.61 0.96 - 1.30 mg/dL    Bun/Cre Ratio 13 12 - 20      Est, Glom Filt Rate >90 >60 ml/min/1.66m2    Calcium 9.3 8.5 - 10.1 mg/dL    Total Bilirubin 0.4 0.2 - 1.0 mg/dL    AST 79 (H) 15 - 37 U/L    ALT 183 (H) 12 - 78 U/L    Alk Phosphatase 80 45 - 117 U/L    Total Protein 7.3 6.4 - 8.2 g/dL    Albumin 3.2 (L) 3.5 - 5.0 g/dL    Globulin 4.1 (H) 2.0 - 4.0 g/dL    Albumin/Globulin Ratio 0.8 (L) 1.1 - 2.2     CK    Collection Time: 02/06/23 11:40 AM   Result Value Ref Range    Total CK 100 39 - 308 U/L   Myoglobin, Blood    Collection Time: 02/06/23 11:40 AM   Result Value Ref Range    Myoglobin 58 16 - 116 ng/mL   Lactic Acid    Collection Time: 02/06/23 11:40 AM   Result Value Ref Range    Lactic  Acid, Plasma 1.2 0.4 - 2.0 mmol/L   Hemoglobin A1C    Collection Time: 02/06/23 11:40 AM   Result Value Ref Range    Hemoglobin A1C 5.4 4.0 - 5.6 %    Estimated Avg Glucose 108 mg/dL   Lipid Panel    Collection Time: 02/06/23 11:40 AM   Result Value Ref Range    LIPID PANEL        Cholesterol, Total 143 <200 mg/dL    Triglycerides 83 <045 mg/dL    HDL 38 mg/dL    LDL Cholesterol 40.9 0 - 100 mg/dL    VLDL Cholesterol Calculated 16.6 mg/dL    Chol/HDL Ratio 3.8 0.0 - 5.0     Magnesium    Collection Time: 02/06/23 11:40 AM   Result Value Ref Range    Magnesium 1.9 1.6 - 2.4 mg/dL   TSH    Collection Time: 02/06/23 11:40 AM   Result Value Ref Range    TSH, 3rd Generation 0.85 0.36 - 3.74 uIU/mL   Troponin    Collection Time: 02/06/23 11:40 AM   Result Value Ref Range    Troponin, High Sensitivity 8 0 - 76 ng/L   EKG 12 Lead    Collection Time: 02/06/23  3:19 PM   Result Value Ref Range    Ventricular Rate 73 BPM  Atrial Rate 73 BPM    P-R Interval 142 ms    QRS Duration 97 ms    Q-T Interval 417 ms    QTc Calculation (Bazett) 460 ms    P Axis -3 degrees    R Axis 83 degrees    T Axis 61 degrees    Diagnosis       Sinus rhythm  Probable left ventricular hypertrophy  ST elev, probable normal early repol pattern  Baseline wander in lead(s) I,II,aVR,aVL,aVF,V1,V2,V3,V4,V5,V6     Urinalysis with Reflex to Culture    Collection Time: 02/07/23  6:25 AM    Specimen: Urine   Result Value Ref Range    Color, UA Yellow/Straw      Appearance Clear Clear      Specific Gravity, UA >1.030 (H) 1.003 - 1.030    pH, Urine 6.0 5.0 - 8.0      Protein, UA Trace (A) Negative mg/dL    Glucose, Ur Negative Negative mg/dL    Ketones, Urine 40 (A) Negative mg/dL    Blood, Urine Negative Negative      Urobilinogen, Urine 0.2 0.2 - 1.0 EU/dL    Nitrite, Urine Negative Negative      Leukocyte Esterase, Urine Negative Negative      WBC, UA 0-4 0 - 5 /hpf    RBC, UA 0-5 0 - 3 /hpf    BACTERIA, URINE 1+ (A) Negative /hpf    Urine Culture if  Indicated Culture not indicated by UA result Culture not indicated by UA result      Calcium Oxalate 3+ (A) Negative   Bilirubin, Confirmatory    Collection Time: 02/07/23  6:25 AM   Result Value Ref Range    Bilirubin Confirmation, UA Negative      Specific Gravity, UA >1.030 (H) 1.003 - 1.030           Assessment:     Principal Problem:    Psychosis, unspecified psychosis type (HCC)  Active Problems:    Urine frequency  Resolved Problems:    * No resolved hospital problems. *    Major depressive disorder by history  Generalized anxiety disorder by history  Stimulant/cocaine use disorder severity unspecified  History of opiate use disorder  Rule out sedative-hypnotic use disorder  Rule out delirium        Patient admitted under a TDO for disorganized behaviors.  Likely secondary to substance abuse.    Plan:   Monitor oral intake.  Daily CBC and CMP.  Monitor for withdrawal symptoms  Obtain collateral information      Signed By: Lajuana Carry MD Psychiatry     Feb 07, 2023

## 2023-02-07 NOTE — Behavioral Health Treatment Team (Signed)
Per Dr. Bernadette Hoit, due to pt. Delerium, and confusion, to block pt's room.

## 2023-02-08 LAB — COMPREHENSIVE METABOLIC PANEL
ALT: 149 U/L — ABNORMAL HIGH (ref 12–78)
AST: 50 U/L — ABNORMAL HIGH (ref 15–37)
Albumin/Globulin Ratio: 0.8 — ABNORMAL LOW (ref 1.1–2.2)
Albumin: 3.3 g/dL — ABNORMAL LOW (ref 3.5–5.0)
Alk Phosphatase: 75 U/L (ref 45–117)
Anion Gap: 8 mmol/L (ref 5–15)
BUN/Creatinine Ratio: 13 (ref 12–20)
BUN: 14 mg/dL (ref 6–20)
CO2: 29 mmol/L (ref 21–32)
Calcium: 9.3 mg/dL (ref 8.5–10.1)
Chloride: 103 mmol/L (ref 97–108)
Creatinine: 1.05 mg/dL (ref 0.70–1.30)
Est, Glom Filt Rate: 90 mL/min/{1.73_m2} (ref >60)
Globulin: 4 g/dL (ref 2.0–4.0)
Glucose: 106 mg/dL — ABNORMAL HIGH (ref 65–100)
Potassium: 4.9 mmol/L (ref 3.5–5.1)
Sodium: 140 mmol/L (ref 136–145)
Total Bilirubin: 0.5 mg/dL (ref 0.2–1.0)
Total Protein: 7.3 g/dL (ref 6.4–8.2)

## 2023-02-08 LAB — CBC
Hematocrit: 38.7 % (ref 36.6–50.3)
Hemoglobin: 12.9 g/dL (ref 12.1–17.0)
MCH: 30.1 PG (ref 26.0–34.0)
MCHC: 33.3 g/dL (ref 30.0–36.5)
MCV: 90.4 FL (ref 80.0–99.0)
MPV: 9.2 FL (ref 8.9–12.9)
Nucleated RBCs: 0 PER 100 WBC
Platelets: 295 10*3/uL (ref 150–400)
RBC: 4.28 M/uL (ref 4.10–5.70)
RDW: 14.6 % — ABNORMAL HIGH (ref 11.5–14.5)
WBC: 4.9 10*3/uL (ref 4.1–11.1)
nRBC: 0 10*3/uL (ref 0.00–0.01)

## 2023-02-08 MED ORDER — OLANZAPINE 10 MG PO TBDP
10 MG | Freq: Every evening | ORAL | Status: AC
Start: 2023-02-08 — End: 2023-02-12
  Administered 2023-02-09 – 2023-02-12 (×4): 10 mg via ORAL

## 2023-02-08 MED FILL — CEPHALEXIN 250 MG PO CAPS: 250 MG | ORAL | Qty: 2

## 2023-02-08 NOTE — Behavioral Health Treatment Team (Signed)
Writer called wifes phone number again per medical override of Dr. Bernadette Hoit, to obtain information about pt. For his care.   Writer was able to connect with wife, Lucas Hicks.    Writer explained who she was, where she was calling from and that the Dr. Ivan Anchors the call on the override, the pt. Has not signed a release as of this time.     Pt. And wife have known each other for 5 years and married for 4 years.  Pt's wife has never known anyone using street drugs or how they  act.  Wife states when she met him he had been clean for 10 months, and she worked hard to help him build his credit and get him "out of the hole".   Wife states she helped him obtain a $10,000 personal loan to build his credit, that patient has gone through in a week and a half.  She regrets now as she realized he has used it all on drugs.  Pt. Has not been working a job, and is not on Disability.   Wife states she has 5 kids at home she home schools and she told him if he relapses and uses, he is not allowed to be at the house. Wife states the only medical issues pt. Has is Asthma, told while he was in prison he has CHF, no bladder issues, musculoskeletal issues noted a metal rod in his right leg due to a former Fibula fx. , as well as carpal tunnel,  no diabetes, no liver issues,Per wife. Wife states he was in prison for 8 months on drug possession, and just got released on March 26th of this year and relapsed within a couple of days.Wife states pt. Was on Lamictal while in Prison.  She states he used to be on Wellbutrin, however, the prison will not give it to him due to the addiction of it.  Wife states she told pt. She will not have him using drugs with her kids in the house.  She asked him to get help, she states he went to the [facility] for 3 hours, left and relapsed again on Crack cocaine, and suboxone. ending up on her front lawn.  She told him he needed to leave.       Wife states she got the call he had intentionally overdosed on April  5th , was unresponsive, given Narcan and taken To [facility] hospital. Wife states she was told it was a good possibility the Cocaine he obtained and used was laced with Fentanyl, they could not test for it.  Wife states he was intubated and discharged on April 13th, 2024.  Wife states she knows he did not pick up his prescription of Lamictal when he left the hospital at that time, so his last dose of Lamictal was April 13th, in the hospital. Wife states upon discharge, he called her,  he was out of gas, and told her he was driving to her place.  Wife states a couple hours went by and he called her again, confused stating he was in [place], [place] and he would see her shortly.  Wife states the next call she received, pt. Was in [place], [place] ER.     Wife states she is not sure he relapsed, as she does not feel there was enough time, however, they did tell her he was positive for Cocaine again.   Wife states they told her he was really confused.  She spoke with him once, and  wife states he was not himself, he sounded like he was "slow", not making a lot of sense when speaking to her, still insisting he was in [place].   The hospital told her, he was just "tired".  Wife states the hospital told her he was going to [place] , [place] , she has been calling trying to find him.  Writer gave pt's wife the Direct number to the unit, explaining again, if she calls in to check on pt. , that the pt. Has not signed a release of information for [place] to talk to her yet, this call was per the DR. And she might not be able to get information quite yet.       Wife states at one point, pt. Had been in [facility] [place] [facility] for help, and did not stay but a day.    Wife verbalized understanding.

## 2023-02-08 NOTE — Behavioral Health Treatment Team (Signed)
Pt slept overnight  ,remained Sleeping at this time , No violent no self harming behavior noticed or reported

## 2023-02-08 NOTE — Behavioral Health Treatment Team (Signed)
Pt. Resting in bed self positioning.  Writer asked pt. If he would like to eat lunch and staff attempted to assist pt. To sitting position on side of bed.  Pt. Grunted, held his legs back.  Writer asked pt. If he would like to sit up and pt. States "no".  Writer let pt. Rest as requested comfortable in bed.  Writer asked pt. If writer could prop his pillows up, to elevate his head to try to eat, pt. Stated, "yes".  Writer elevated hob with pillows.  Writer obtained a tray table for his lunch, prepared his lunch for him to eat.  Writer verbally prompt patient x 3 that his food was in front of him to take a french fry or bite of his hamburger.  Pt. Continued to stare, made grunting noise, and shook his head no.  Writer asked pt. If he wanted to eat, pt. States, "yes".  Writer after washing her hands and putting on gloves broke a french fry in a bite size piece, verbally told patient, what it was and put it to pt's lips.  Upon french fry touching his lips, pt. Opened mouth for writer to put food in.  Pt. Then proceeded to chew with his eyes open.  Pt. Continued to chew his food for approx. 2 minutes prior to swallowing.  Did not appear pt. Had trouble swallowing.  Writer placed another bite size piece of fry up to pt's lips with verbal prompt of what the food was, pt. Opened his mouth and continued the same process, lengthwise as the first time.  Writer asked pt. If he would like a drink, he stated, "yes', Clinical research associate with verbal prompts told pt. It was a straw up to his lips, pt. Put it in his teeth and bit down.  Writer explained to pt. To put straw to his lips only and suck in.  Pt. Continued biting on straw x 3, then shook his head no and said "no". Loudly.   Writer verbally prompted with a bite size of cheeseburger, pt. Put opened his mouth, chewing food with same process as before.   Pt. Continued this process eating, consuming 4 bites of cheeseburger and 3 french fries.   Pt. Product/process development scientist he was tired.  Writer asked  pt. If he was hurting.  Pt. Hesitated, and shook head no, denied nausea, denied headache.  Pt. Noted to have goose bumps, upon writer asking pt. If he was  cold, pt. Stated, "chills and cold".  Writer made sure pt. Was finished swallowing his food, offered a drink again, which pt. Refused, covered pt. Up with blankets.  Pt. Resting on left side at current time.

## 2023-02-08 NOTE — Behavioral Health Treatment Team (Signed)
Dressing change to right index finger irrigated with sterile saline, pat with betadine and applied zero form dressing wrap and kling.

## 2023-02-08 NOTE — Plan of Care (Signed)
Problem: Discharge Planning  Goal: Discharge to home or other facility with appropriate resources  02/07/2023 1312 by Hormell, Suellen, LPN  Outcome: Progressing     Problem: Pain  Goal: Verbalizes/displays adequate comfort level or baseline comfort level  02/07/2023 1312 by Hormell, Suellen, LPN  Outcome: Progressing     Problem: Risk for Elopement  Goal: Patient will not exit the unit/facility without proper excort  02/07/2023 1312 by Hormell, Suellen, LPN  Outcome: Progressing     Problem: Safety - Adult  Goal: Free from fall injury  02/08/2023 0009 by Estanislado Pandy, RN  Outcome: Progressing  02/07/2023 1312 by Riki Rusk, LPN  Outcome: Progressing     Problem: Chronic Conditions and Co-morbidities  Goal: Patient's chronic conditions and co-morbidity symptoms are monitored and maintained or improved  02/08/2023 0009 by Estanislado Pandy, RN  Outcome: Progressing  02/07/2023 1312 by Riki Rusk, LPN  Outcome: Progressing     Problem: Skin/Tissue Integrity  Goal: Absence of new skin breakdown  Description: 1.  Monitor for areas of redness and/or skin breakdown  2.  Assess vascular access sites hourly  3.  Every 4-6 hours minimum:  Change oxygen saturation probe site  4.  Every 4-6 hours:  If on nasal continuous positive airway pressure, respiratory therapy assess nares and determine need for appliance change or resting period.  02/07/2023 1312 by Hormell, Suellen, LPN  Outcome: Progressing     Problem: Depression  Goal: Will be euthymic at discharge  Description: INTERVENTIONS:  1. Administer medication as ordered  2. Provide emotional support via 1:1 interaction with staff  3. Encourage involvement in milieu/groups/activities  4. Monitor for social isolation  02/07/2023 1312 by Riki Rusk, LPN  Outcome: Progressing     Problem: Psychosis  Goal: Will report no hallucinations or delusions  Description: INTERVENTIONS:  1. Administer medication as  ordered  2. Assist with reality testing to support increasing  orientation  3. Assess if patient's hallucinations or delusions are encouraging self harm or harm to others and intervene as appropriate  02/08/2023 0009 by Estanislado Pandy, RN  Outcome: Progressing  02/07/2023 1312 by Riki Rusk, LPN  Outcome: Progressing     Problem: Behavior  Goal: Pt/Family maintain appropriate behavior and adhere to behavioral management agreement, if implemented  Description: INTERVENTIONS:  1. Assess patient/family's coping skills and  non-compliant behavior (including use of illegal substances)  2. Notify security of behavior or suspected illegal substances which indicate the need for search of the family and/or belongings  3. Encourage verbalization of thoughts and concerns in a socially appropriate manner  4. Utilize positive, consistent limit setting strategies supporting safety of patient, staff and others  5. Encourage participation in the decision making process about the behavioral management agreement  6. If a visitor's behavior poses a threat to safety call refer to organization policy.  7. Initiate consult with Social Worker, Psychosocial CNS, Spiritual Care as appropriate  02/07/2023 1312 by Riki Rusk, LPN  Outcome: Progressing     Problem: Drug Abuse/Detox  Goal: Will have no detox symptoms and will verbalize plan for changing drug-related behavior  Description: INTERVENTIONS:  1. Administer medication as ordered  2. Monitor physical status  3. Provide emotional support with 1:1 interaction with staff  4. Encourage  recovery focused treatment   02/07/2023 1312 by Hormell, Suellen, LPN  Outcome: Progressing     Problem: Sleep Disturbance  Goal: Will exhibit normal sleeping pattern  Description: INTERVENTIONS:  1. Administer medication as ordered  2. Decrease environmental  stimuli, including noise, as appropriate  3. Discourage social isolation and naps during the day  02/08/2023 0009 by Estanislado Pandy, RN  Outcome: Progressing  02/07/2023 1312 by Riki Rusk,  LPN  Outcome: Progressing     Problem: Involuntary Admit  Goal: Will cooperate with staff recommendations and doctor's orders and will demonstrate appropriate behavior  Description: INTERVENTIONS:  1. Treat underlying conditions and offer medication as ordered  2. Educate regarding involuntary admission procedures and rules  3. Contain excessive/inappropriate behavior per unit and hospital policies  02/08/2023 0009 by Estanislado Pandy, RN  Outcome: Progressing  02/07/2023 1312 by Riki Rusk, LPN  Outcome: Progressing     Problem: Self Care Deficit  Goal: Return ADL status to a safe level of function  Description: INTERVENTIONS:  1. Administer medication as ordered  2. Assess ADL deficits and provide assistive devices as needed  3. Obtain PT/OT consults as needed  4. Assist and instruct patient to increase activity and self care as tolerated  02/08/2023 0009 by Estanislado Pandy, RN  Outcome: Progressing  02/07/2023 1312 by Riki Rusk, LPN  Outcome: Progressing

## 2023-02-08 NOTE — Behavioral Health Treatment Team (Signed)
It was noted when Writer was getting pt's VS, pt. Resisted by pulling against me et tightening up on his arm. Assured pt. That I was only trying to get his VS  et was not going to hurt him. Pt. Eventually relaxed his arm et let me get his VS.

## 2023-02-08 NOTE — Behavioral Health Treatment Team (Signed)
Pt. Had been sitting on the edge of the bed resting his head in his hands at approx. 1610, writer noticed during rounds, pt. Had his eyes closed and leaning a bit to the side.  Pt. Alert to person, opened his eyes.  Writer asked pt.if he was tired, pt. Stated, " yes, very".  Writer asked pt. If he would like to lay down in the bed, he stated,yes. Writer offered to assist pt. To the bathroom to toilet, pt. Refused.  Writer and 1 staff member assisted pt in laying position.   Pt.'s lower extremities are rigid, has difficulty moving them at times on his own.  Upper extremities  a little more flexible, however, has involuntary jerks once in a while.  Pt. Making better eye contact. Calm upon speaking. Pt. Denies issues at the time.  At current time patient remains lying in bed resting.

## 2023-02-08 NOTE — Behavioral Health Treatment Team (Signed)
Pt did not come to group.

## 2023-02-08 NOTE — Behavioral Health Treatment Team (Signed)
Writer called [facility] to request Nurses notes of ER visit while there prior to transfer to our facility.notes we faxed, content of notes states pt. Was found taking clothes off in the middle of the street in [place], confused, muttering about no Lamictal.   Pt. Had dressing on right index finger indicitive of surgical wounds/lesions from recent surgery.  Pt. Told them he was homeless, had not slept in days and thought he was still in [place].  According to their records, pt. Was talking in sentences to them, and was oriented x 4, no ambulation issues.

## 2023-02-08 NOTE — Progress Notes (Incomplete)
Psychiatric Progress Note      Patient: Lucas Hicks MRN: [id number]  SSN: [id number]    Date of Birth: 10/06/1981  Age: 42 y.o.  Sex: male      Admit Date: 02/04/2023       Subjective:     Lucas Hicks patient was standing in his room.  Appears to be anxious with some hand gestures.  Not able to participate in the interview.  Patient requiring assistance with ADLs.  No aggressive behaviors.  Patient refusing medications sometimes.    Objective:     Vitals:    02/05/23 1530 02/06/23 1015 02/06/23 1339 02/06/23 1548   BP: 131/77 (!) 98/59 127/77 116/66   Pulse: 77 72 78 63   Resp: 16 16 16 16    Temp: 97.7 F (36.5 C) 98.6 F (37 C) 98.7 F (37.1 C) 97.8 F (36.6 C)   TempSrc: Temporal Temporal Temporal Temporal   SpO2: 99% 97% 97% 99%   Weight:       Height:            Mental Status Exam:     Patient is alert and oriented to self.  Patient did not talk to Clinical research associate.  Patient with poor personal hygiene.  Patient also appears anxious.    MEDICATIONS:  Current Facility-Administered Medications   Medication Dose Route Frequency    acetaminophen (TYLENOL) tablet 650 mg  650 mg Oral Q4H PRN    polyethylene glycol (GLYCOLAX) packet 17 g  17 g Oral Daily PRN    hydrOXYzine HCl (ATARAX) tablet 50 mg  50 mg Oral TID PRN    haloperidol (HALDOL) tablet 5 mg  5 mg Oral Q4H PRN    Or    haloperidol lactate (HALDOL) injection 5 mg  5 mg IntraMUSCular Q4H PRN    diphenhydrAMINE (BENADRYL) injection 50 mg  50 mg IntraMUSCular Q4H PRN    traZODone (DESYREL) tablet 50 mg  50 mg Oral Nightly PRN    aluminum & magnesium hydroxide-simethicone (MAALOX) 200-200-20 MG/5ML suspension 30 mL  30 mL Oral Q6H PRN    cephALEXin (KEFLEX) capsule 500 mg  500 mg Oral 4 times per day        DISCUSSION:  Discussed risk and benefits of medication, provided an opportunity to answer any questions, reviewed discharge goals.    Lab/Data Review:  Recent Results (from the past 24 hour(s))   CBC with Auto Differential    Collection Time: 02/06/23 11:40  AM   Result Value Ref Range    WBC 5.0 4.1 - 11.1 K/uL    RBC 4.34 4.10 - 5.70 M/uL    Hemoglobin 12.8 12.1 - 17.0 g/dL    Hematocrit 56.3 87.5 - 50.3 %    MCV 90.1 80.0 - 99.0 FL    MCH 29.5 26.0 - 34.0 PG    MCHC 32.7 30.0 - 36.5 g/dL    RDW 64.3 32.9 - 51.8 %    Platelets 306 150 - 400 K/uL    MPV 9.0 8.9 - 12.9 FL    Nucleated RBCs 0.0 0.0 PER 100 WBC    nRBC 0.00 0.00 - 0.01 K/uL    Neutrophils % 49 32 - 75 %    Lymphocytes % 38 12 - 49 %    Monocytes % 12 5 - 13 %    Eosinophils % 1 0 - 7 %    Basophils % 0 0 - 1 %    Immature Granulocytes % 0 0 - 0.5 %  Neutrophils Absolute 2.4 1.8 - 8.0 K/UL    Lymphocytes Absolute 1.9 0.8 - 3.5 K/UL    Monocytes Absolute 0.6 0.0 - 1.0 K/UL    Eosinophils Absolute 0.1 0.0 - 0.4 K/UL    Basophils Absolute 0.0 0.0 - 0.1 K/UL    Immature Granulocytes Absolute 0.0 0.00 - 0.04 K/UL    Differential Type AUTOMATED     Ammonia    Collection Time: 02/06/23 11:40 AM   Result Value Ref Range    Ammonia <10 <32 umol/L   Comprehensive Metabolic Panel    Collection Time: 02/06/23 11:40 AM   Result Value Ref Range    Sodium 139 136 - 145 mmol/L    Potassium 4.6 3.5 - 5.1 mmol/L    Chloride 102 97 - 108 mmol/L    CO2 31 21 - 32 mmol/L    Anion Gap 6 5 - 15 mmol/L    Glucose 95 65 - 100 mg/dL    BUN 12 6 - 20 mg/dL    Creatinine 0.98 1.19 - 1.30 mg/dL    Bun/Cre Ratio 13 12 - 20      Est, Glom Filt Rate >90 >60 ml/min/1.27m2    Calcium 9.3 8.5 - 10.1 mg/dL    Total Bilirubin 0.4 0.2 - 1.0 mg/dL    AST 79 (H) 15 - 37 U/L    ALT 183 (H) 12 - 78 U/L    Alk Phosphatase 80 45 - 117 U/L    Total Protein 7.3 6.4 - 8.2 g/dL    Albumin 3.2 (L) 3.5 - 5.0 g/dL    Globulin 4.1 (H) 2.0 - 4.0 g/dL    Albumin/Globulin Ratio 0.8 (L) 1.1 - 2.2     CK    Collection Time: 02/06/23 11:40 AM   Result Value Ref Range    Total CK 100 39 - 308 U/L   Myoglobin, Blood    Collection Time: 02/06/23 11:40 AM   Result Value Ref Range    Myoglobin 58 16 - 116 ng/mL   Lactic Acid    Collection Time: 02/06/23 11:40 AM    Result Value Ref Range    Lactic Acid, Plasma 1.2 0.4 - 2.0 mmol/L   Hemoglobin A1C    Collection Time: 02/06/23 11:40 AM   Result Value Ref Range    Hemoglobin A1C 5.4 4.0 - 5.6 %    Estimated Avg Glucose 108 mg/dL   Lipid Panel    Collection Time: 02/06/23 11:40 AM   Result Value Ref Range    LIPID PANEL        Cholesterol, Total 143 <200 mg/dL    Triglycerides 83 <147 mg/dL    HDL 38 mg/dL    LDL Cholesterol 82.9 0 - 100 mg/dL    VLDL Cholesterol Calculated 16.6 mg/dL    Chol/HDL Ratio 3.8 0.0 - 5.0     Magnesium    Collection Time: 02/06/23 11:40 AM   Result Value Ref Range    Magnesium 1.9 1.6 - 2.4 mg/dL   TSH    Collection Time: 02/06/23 11:40 AM   Result Value Ref Range    TSH, 3rd Generation 0.85 0.36 - 3.74 uIU/mL   Troponin    Collection Time: 02/06/23 11:40 AM   Result Value Ref Range    Troponin, High Sensitivity 8 0 - 76 ng/L   EKG 12 Lead    Collection Time: 02/06/23  3:19 PM   Result Value Ref Range    Ventricular Rate 73 BPM  Atrial Rate 73 BPM    P-R Interval 142 ms    QRS Duration 97 ms    Q-T Interval 417 ms    QTc Calculation (Bazett) 460 ms    P Axis -3 degrees    R Axis 83 degrees    T Axis 61 degrees    Diagnosis       Sinus rhythm  Probable left ventricular hypertrophy  ST elev, probable normal early repol pattern  Baseline wander in lead(s) I,II,aVR,aVL,aVF,V1,V2,V3,V4,V5,V6     Urinalysis with Reflex to Culture    Collection Time: 02/07/23  6:25 AM    Specimen: Urine   Result Value Ref Range    Color, UA Yellow/Straw      Appearance Clear Clear      Specific Gravity, UA >1.030 (H) 1.003 - 1.030    pH, Urine 6.0 5.0 - 8.0      Protein, UA Trace (A) Negative mg/dL    Glucose, Ur Negative Negative mg/dL    Ketones, Urine 40 (A) Negative mg/dL    Blood, Urine Negative Negative      Urobilinogen, Urine 0.2 0.2 - 1.0 EU/dL    Nitrite, Urine Negative Negative      Leukocyte Esterase, Urine Negative Negative      WBC, UA 0-4 0 - 5 /hpf    RBC, UA 0-5 0 - 3 /hpf    BACTERIA, URINE 1+ (A)  Negative /hpf    Urine Culture if Indicated Culture not indicated by UA result Culture not indicated by UA result      Calcium Oxalate 3+ (A) Negative   Bilirubin, Confirmatory    Collection Time: 02/07/23  6:25 AM   Result Value Ref Range    Bilirubin Confirmation, UA Negative      Specific Gravity, UA >1.030 (H) 1.003 - 1.030           Assessment:     Principal Problem:    Psychosis, unspecified psychosis type (HCC)  Active Problems:    Urine frequency  Resolved Problems:    * No resolved hospital problems. *    Major depressive disorder by history  Generalized anxiety disorder by history  Stimulant/cocaine use disorder severity unspecified  History of opiate use disorder  Rule out sedative-hypnotic use disorder  Rule out delirium        Patient admitted under a TDO for disorganized behaviors.  Likely secondary to substance abuse.    Plan:   Encouraged to take medications.  If patient continues to refuse antibiotics will reconsult medicine if patient requires IV antibiotics.  Monitor oral intake.  Daily CBC and CMP.  Monitor for withdrawal symptoms  Obtain collateral information      Signed By: Lajuana Carry MD Psychiatry     Feb 07, 2023

## 2023-02-08 NOTE — Behavioral Health Treatment Team (Addendum)
Pt received sitting at edge of bed looking downward, and sometimes will lean back ward while sitting at edge of bed.  Drssing intact on his Right index finger.     At 1955 upon assessment pt alert to person and able to tell his birthday, but remained sitting at edge of bed and refused to eat dinner which was at his table in his room, and pt will have thought block and sometime will  with one word saying" Ok" refused to walk to bathroom to use toilet, pt wearing depends.  At 2010 With help of another staff member  Larena Sox who was present in his room with total assistance by me and jackie we reposition pt in bed to lay in bed, and he laid in supine position, then by 2035 he  was noticed standing near his bed holding the sheet in his hands, I went and escorted the pt  to his dinner tray at table that took at least more than 5 minutes as he was rigid and  need frequent redirections and  prompts to sit in chair but he remained standing at table area and wont eat his dinner.    At 2130 pt escorted form where he was standing at table to bathroom to encouraged him use toilet but he stood in front of toilet and didn't sit on toilet, with total assistance form me, I removed his depend and he was  incontinent of urine, and hygiene provided total assistance, Pt noticed to have irritate rd area on his scrotum, but he didn't allow e full assessment of the area.  are cleaned and new depend applied then escorted back to sit in bed,, .    With a lot of prompts and encouragement  and with total assistance pt drank 6 oz grape juice( ) and ate 2 pieces of french fries from his dinner tray, he refused vanilla pudding and  refused his HS sandwich and  the  cheese burger sandwich form dinner try, he drank about 4 sips of water.   Then with total assistance I repositioned him in bed and he laid  in left side position in bed, by 2235.    Pt Refused Kelfex 500mg  midnight dose when offered.    Pt start sleeping by 2255, and remained  sleeping as of this time, pt breathing spontaneously and changing position in bed by himself.

## 2023-02-08 NOTE — Behavioral Health Treatment Team (Signed)
B:   Patient alert and oriented x 1.   Pt. States depression is 0.  Pt. States Anxiety is 0.  Pt. denies Hallucinations.  Pt. denies Delusions.  Pt. denies SI.  Pt. denies HI.   Pt. cooperative with Assessment.  Pt.'s behavior Withdrawn.   Pt is total care, writer went into pt room to feed him. He ate 50% of meal and drank 150 ml of juice. Pt was responding to questions, he knew his wife's name was Leia Alf, that they had been married for 4 years and he did not live in [place]. Pt is still not responding to open ended questions. Pt is having a hard time following commands. He is unable to feed himself. No c/o pain or anything else, will continue to monitor.     I:    If patient is disoriented, reorient pt.  Build trust with patient, by therapeutic listening and Groups.  Encourage pt. To attend and Participate in Groups.  Provide Medications as ordered and needed.  Encourage pt. To be up for all meals and snacks, and consume all of each. Encourage pt. To interact with staff and peers in a positive manner.  Encourage pt. To keep good hygiene.  Q 15 minute safety checks.    R:   Pt. did not attend and Participate in Group.  Pt. Is Compliant with Medications   Refusing Psychiatric Medications and Refusing Medical Medications.   Pt. is getting up for meals and snacks.  Pt. Consumes 50% of Meals.  Pt. Is not, interacting with Peers.   Pt.'s hygiene is Poor.  Pt. does not, have any safety issues.    P:   Pt. Will develop effective communication skills to communicate with staff and peers by the end of the week.  Pt. Will continue to comply with Plan of Care toward Discharge.  Pt. Will continue to stay safe on the unit.

## 2023-02-08 NOTE — Behavioral Health Treatment Team (Signed)
Writer was reviewing chart, and reconciled outside allergies.   In doing so, a warning came up as a cross reactivity with keflex (cephalexin).   Writer called Hospitalist Dr. Hazle Quant, to make him aware.   Dr. Hazle Quant stated, there is a less than 1 -2% cross reactivity in the penicillin class for keflex.  Stated to continue keflex, even though pt. Refusing at this time.      In discussing pt's symptoms today, Hospitalist discussed past medications pt.. was given in his last hospital stay, if pt. 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 Writer and staff will discuss hospitalist concerns with Dr. Bernadette Hoit for team care for pt. Care.

## 2023-02-08 NOTE — Behavioral Health Treatment Team (Signed)
Writer in to patients room to check on patient.  Pt. Alert to name being called.  Writer asked pt. If he would like to sit up on the edge of the bed.  Pt. Shook head yes.  Upon writer attempting to assist pt to sitting position with verbal prompts, pt. With right arm opposite side of where writer was standing, postured at [employer].  Writer backed away, and asked patient, why he was upset.  Pt. Aggressively said, " I"m not being mean", while having flat affect,, staring at the ceiling.  Writer asked pt. If he was hungry and would like some cold juice or pudding.  Pt. Did not answer.  2nd staff member came into room, we encouraged pt. To sit at edge of bed to try to get some juice and pudding into him so he can feel better.  With assist of 2 staff, pt. Attempted to move to slide his core over on the bed , not able to do so, 2 staff assisted pt. To sitting position.  Pt. Was unsteady at first, leaning to the left as if he was going to lay back down, unable to catch himself, staff assisted with sitting position.  Pt. Sat with legs partial flexed unable to straighten them out. Upon asking if he was comfortable, he said, "yes", and was able to steady himself.   With staff assist, pt. Held styrofoam cup with grape juice, however, staff had to physically assist cup to patient's mouth, to drink.   Pt. Did drink 2 cupfuls of grape juice.  Staff attempted to get pt. To hold pudding cup to eat it, pt. Did not hold it.  Pt. Did consume 2 pudding cups with staff feeding patient, verbal prompts with touch to lips.      Pt. Was incontinent of urine, pt. Unable to assist in cleansing and changing his clothes.  Staff total assist, cleansed  pt. Changed clothing and bed linens.  Pt. Remains rigid, verbal prompts to move to side of bed and sit down, and staff assist of 2 to reposition to laying position in bed.  Pt. Shivering, stating, "chills cold". Pt. Covered with blankets.  Writer asked pt. If he was comfortable, pt. States, "yes".

## 2023-02-09 MED FILL — CEPHALEXIN 250 MG PO CAPS: 250 MG | ORAL | Qty: 2

## 2023-02-09 MED FILL — OLANZAPINE 10 MG PO TBDP: 10 MG | ORAL | Qty: 1

## 2023-02-09 NOTE — Behavioral Health Treatment Team (Signed)
Pt received standing near his bed,   Pt was incontinent of urine at 1930 and hygiene provided with help of two staff total assistant.    Pt  is confused  and oriented to person only was bale to tell his  first name and when asked how he was doing he said" fantastic my brother".  Dressing intact on his Right index finger.     by 2015 pt noticed sitting at his bed side table.  Mostly looking down word and at times rocking, lower limbs rigidity wile standing and shuffling gait     At 2130 pt with mutiple prompts and assistant of stff sat in bed, pt wearing brief and was dry, Ptoffred HS snack and  he ate 4 pieces of cookies and drank 4 oz ( ) of grape juice with total assistant , Pt accepted HS medication Olanzapine 10mg  , I prompted him to take  medication and he said yes" ", I placed medication in his mouth then he drank Juice,, with total assistance pt helped to lay in bed again by by [id number]  also  pt accepted Keflex 50mg  Midnight dose by 2300 drank 4 oz of water and 2 spoon of apple sauce.     Pt start sleeping by 2315, and remained sleeping as of this time, pt breathing spontaneously and changing position in bed by himself.

## 2023-02-09 NOTE — Plan of Care (Signed)
Problem: Discharge Planning  Goal: Discharge to home or other facility with appropriate resources  Outcome: Progressing     Problem: Pain  Goal: Verbalizes/displays adequate comfort level or baseline comfort level  02/08/2023 1830 by Royann Shivers, RN  Outcome: Progressing     Problem: Risk for Elopement  Goal: Patient will not exit the unit/facility without proper excort  02/08/2023 1830 by Royann Shivers, RN  Outcome: Progressing     Problem: Safety - Adult  Goal: Free from fall injury  02/09/2023 0022 by Estanislado Pandy, RN  Outcome: Progressing  02/08/2023 1830 by Royann Shivers, RN  Outcome: Progressing     Problem: Chronic Conditions and Co-morbidities  Goal: Patient's chronic conditions and co-morbidity symptoms are monitored and maintained or improved  Outcome: Progressing     Problem: Skin/Tissue Integrity  Goal: Absence of new skin breakdown  Description: 1.  Monitor for areas of redness and/or skin breakdown  2.  Assess vascular access sites hourly  3.  Every 4-6 hours minimum:  Change oxygen saturation probe site  4.  Every 4-6 hours:  If on nasal continuous positive airway pressure, respiratory therapy assess nares and determine need for appliance change or resting period.  02/09/2023 0022 by Estanislado Pandy, RN  Outcome: Progressing  02/08/2023 1830 by Royann Shivers, RN  Outcome: Progressing     Problem: Sleep Disturbance  Goal: Will exhibit normal sleeping pattern  Description: INTERVENTIONS:  1. Administer medication as ordered  2. Decrease environmental stimuli, including noise, as appropriate  3. Discourage social isolation and naps during the day  Outcome: Progressing     Problem: Self Care Deficit  Goal: Return ADL status to a safe level of function  Description: INTERVENTIONS:  1. Administer medication as ordered  2. Assess ADL deficits and provide assistive devices as needed  3. Obtain PT/OT consults as needed  4. Assist and instruct patient to increase activity and self  care as tolerated  Outcome: Progressing

## 2023-02-09 NOTE — Progress Notes (Signed)
Psychiatric Progress Note      Patient: Lucas Hicks MRN: [id number]  SSN: [id number]    Date of Birth: March 15, 1981  Age: 42 y.o.  Sex: male      Admit Date: 02/04/2023       Subjective:     Lucas Hicks patient sitting on his bed.  Appears to be in mild distress.  Patient also appears to be anxious.  Patient responded to a couple of questions and did not talk to the Clinical research associate after that.  He stated that he is feeling better.  He did not answer any questions after that.  Patient with some improvement with his oral intake and less dependent on ADLs.    Objective:     Vitals:    02/05/23 1530 02/06/23 1015 02/06/23 1339 02/06/23 1548   BP: 131/77 (!) 98/59 127/77 116/66   Pulse: 77 72 78 63   Resp: 16 16 16 16    Temp: 97.7 F (36.5 C) 98.6 F (37 C) 98.7 F (37.1 C) 97.8 F (36.6 C)   TempSrc: Temporal Temporal Temporal Temporal   SpO2: 99% 97% 97% 99%   Weight:       Height:            Mental Status Exam:     Patient is alert and oriented to self.  Patient with poor personal hygiene.  Speech- slow/sparse.  Patient's mood appears to be anxious.  He has constricted affect.  He did not answer questions related to suicidal or homicidal ideations.      MEDICATIONS:  Current Facility-Administered Medications   Medication Dose Route Frequency    acetaminophen (TYLENOL) tablet 650 mg  650 mg Oral Q4H PRN    polyethylene glycol (GLYCOLAX) packet 17 g  17 g Oral Daily PRN    hydrOXYzine HCl (ATARAX) tablet 50 mg  50 mg Oral TID PRN    haloperidol (HALDOL) tablet 5 mg  5 mg Oral Q4H PRN    Or    haloperidol lactate (HALDOL) injection 5 mg  5 mg IntraMUSCular Q4H PRN    diphenhydrAMINE (BENADRYL) injection 50 mg  50 mg IntraMUSCular Q4H PRN    traZODone (DESYREL) tablet 50 mg  50 mg Oral Nightly PRN    aluminum & magnesium hydroxide-simethicone (MAALOX) 200-200-20 MG/5ML suspension 30 mL  30 mL Oral Q6H PRN    cephALEXin (KEFLEX) capsule 500 mg  500 mg Oral 4 times per day        DISCUSSION:  Discussed risk and benefits of  medication, provided an opportunity to answer any questions, reviewed discharge goals.    Lab/Data Review:  Recent Results (from the past 24 hour(s))   CBC with Auto Differential    Collection Time: 02/06/23 11:40 AM   Result Value Ref Range    WBC 5.0 4.1 - 11.1 K/uL    RBC 4.34 4.10 - 5.70 M/uL    Hemoglobin 12.8 12.1 - 17.0 g/dL    Hematocrit 78.2 95.6 - 50.3 %    MCV 90.1 80.0 - 99.0 FL    MCH 29.5 26.0 - 34.0 PG    MCHC 32.7 30.0 - 36.5 g/dL    RDW 21.3 08.6 - 57.8 %    Platelets 306 150 - 400 K/uL    MPV 9.0 8.9 - 12.9 FL    Nucleated RBCs 0.0 0.0 PER 100 WBC    nRBC 0.00 0.00 - 0.01 K/uL    Neutrophils % 49 32 - 75 %    Lymphocytes %  38 12 - 49 %    Monocytes % 12 5 - 13 %    Eosinophils % 1 0 - 7 %    Basophils % 0 0 - 1 %    Immature Granulocytes % 0 0 - 0.5 %    Neutrophils Absolute 2.4 1.8 - 8.0 K/UL    Lymphocytes Absolute 1.9 0.8 - 3.5 K/UL    Monocytes Absolute 0.6 0.0 - 1.0 K/UL    Eosinophils Absolute 0.1 0.0 - 0.4 K/UL    Basophils Absolute 0.0 0.0 - 0.1 K/UL    Immature Granulocytes Absolute 0.0 0.00 - 0.04 K/UL    Differential Type AUTOMATED     Ammonia    Collection Time: 02/06/23 11:40 AM   Result Value Ref Range    Ammonia <10 <32 umol/L   Comprehensive Metabolic Panel    Collection Time: 02/06/23 11:40 AM   Result Value Ref Range    Sodium 139 136 - 145 mmol/L    Potassium 4.6 3.5 - 5.1 mmol/L    Chloride 102 97 - 108 mmol/L    CO2 31 21 - 32 mmol/L    Anion Gap 6 5 - 15 mmol/L    Glucose 95 65 - 100 mg/dL    BUN 12 6 - 20 mg/dL    Creatinine 8.46 9.62 - 1.30 mg/dL    Bun/Cre Ratio 13 12 - 20      Est, Glom Filt Rate >90 >60 ml/min/1.48m2    Calcium 9.3 8.5 - 10.1 mg/dL    Total Bilirubin 0.4 0.2 - 1.0 mg/dL    AST 79 (H) 15 - 37 U/L    ALT 183 (H) 12 - 78 U/L    Alk Phosphatase 80 45 - 117 U/L    Total Protein 7.3 6.4 - 8.2 g/dL    Albumin 3.2 (L) 3.5 - 5.0 g/dL    Globulin 4.1 (H) 2.0 - 4.0 g/dL    Albumin/Globulin Ratio 0.8 (L) 1.1 - 2.2     CK    Collection Time: 02/06/23 11:40 AM   Result  Value Ref Range    Total CK 100 39 - 308 U/L   Myoglobin, Blood    Collection Time: 02/06/23 11:40 AM   Result Value Ref Range    Myoglobin 58 16 - 116 ng/mL   Lactic Acid    Collection Time: 02/06/23 11:40 AM   Result Value Ref Range    Lactic Acid, Plasma 1.2 0.4 - 2.0 mmol/L   Hemoglobin A1C    Collection Time: 02/06/23 11:40 AM   Result Value Ref Range    Hemoglobin A1C 5.4 4.0 - 5.6 %    Estimated Avg Glucose 108 mg/dL   Lipid Panel    Collection Time: 02/06/23 11:40 AM   Result Value Ref Range    LIPID PANEL        Cholesterol, Total 143 <200 mg/dL    Triglycerides 83 <952 mg/dL    HDL 38 mg/dL    LDL Cholesterol 84.1 0 - 100 mg/dL    VLDL Cholesterol Calculated 16.6 mg/dL    Chol/HDL Ratio 3.8 0.0 - 5.0     Magnesium    Collection Time: 02/06/23 11:40 AM   Result Value Ref Range    Magnesium 1.9 1.6 - 2.4 mg/dL   TSH    Collection Time: 02/06/23 11:40 AM   Result Value Ref Range    TSH, 3rd Generation 0.85 0.36 - 3.74 uIU/mL   Troponin  Collection Time: 02/06/23 11:40 AM   Result Value Ref Range    Troponin, High Sensitivity 8 0 - 76 ng/L   EKG 12 Lead    Collection Time: 02/06/23  3:19 PM   Result Value Ref Range    Ventricular Rate 73 BPM    Atrial Rate 73 BPM    P-R Interval 142 ms    QRS Duration 97 ms    Q-T Interval 417 ms    QTc Calculation (Bazett) 460 ms    P Axis -3 degrees    R Axis 83 degrees    T Axis 61 degrees    Diagnosis       Sinus rhythm  Probable left ventricular hypertrophy  ST elev, probable normal early repol pattern  Baseline wander in lead(s) I,II,aVR,aVL,aVF,V1,V2,V3,V4,V5,V6     Urinalysis with Reflex to Culture    Collection Time: 02/07/23  6:25 AM    Specimen: Urine   Result Value Ref Range    Color, UA Yellow/Straw      Appearance Clear Clear      Specific Gravity, UA >1.030 (H) 1.003 - 1.030    pH, Urine 6.0 5.0 - 8.0      Protein, UA Trace (A) Negative mg/dL    Glucose, Ur Negative Negative mg/dL    Ketones, Urine 40 (A) Negative mg/dL    Blood, Urine Negative Negative       Urobilinogen, Urine 0.2 0.2 - 1.0 EU/dL    Nitrite, Urine Negative Negative      Leukocyte Esterase, Urine Negative Negative      WBC, UA 0-4 0 - 5 /hpf    RBC, UA 0-5 0 - 3 /hpf    BACTERIA, URINE 1+ (A) Negative /hpf    Urine Culture if Indicated Culture not indicated by UA result Culture not indicated by UA result      Calcium Oxalate 3+ (A) Negative   Bilirubin, Confirmatory    Collection Time: 02/07/23  6:25 AM   Result Value Ref Range    Bilirubin Confirmation, UA Negative      Specific Gravity, UA >1.030 (H) 1.003 - 1.030           Assessment:     Principal Problem:    Psychosis, unspecified psychosis type (HCC)  Active Problems:    Urine frequency  Resolved Problems:    * No resolved hospital problems. *    Major depressive disorder by history  Generalized anxiety disorder by history  Stimulant/cocaine use disorder severity unspecified  History of opiate use disorder  Rule out sedative-hypnotic use disorder  Rule out delirium        Patient admitted under a TDO for disorganized behaviors.  Likely secondary to substance abuse.    Plan:   Continue current medications.  Patient was encouraged to take his medications   monitor oral intake.  Daily CBC and CMP.  Monitor for withdrawal symptoms        Signed By: Lajuana Carry MD Psychiatry     Feb 07, 2023

## 2023-02-09 NOTE — Behavioral Health Treatment Team (Signed)
B:   Patient alert and oriented x 1.   Pt. States depression is 0.  Pt. States Anxiety is 0.  Pt. denies Hallucinations.  Pt. denies Delusions.  Pt. denies SI.  Pt. denies HI.   Pt. cooperative with Assessment.  Pt.'s behavior Withdrawn.   Pt has been less rigid and moving more freely and independent. Pt is starting to eat with partial assist, writer gave pt his food and he brought the food up to his mouth to eat. Pt is more clear and able to communicate appropriately with staff.     I:    If patient is disoriented, reorient pt.  Build trust with patient, by therapeutic listening and Groups.  Encourage pt. To attend and Participate in Groups.  Provide Medications as ordered and needed.  Encourage pt. To be up for all meals and snacks, and consume all of each. Encourage pt. To interact with staff and peers in a positive manner.  Encourage pt. To keep good hygiene.  Q 15 minute safety checks.    R:   Pt. did not attend and Participate in Group.  Pt. Is Compliant with Medications   Yes.   Pt. is getting up for meals and snacks.  Pt. Consumes 50% of Meals.  Pt. is not, interacting with Peers.   Pt.'s hygiene is Poor.  Pt. does not, have any safety issues.    P:   Pt. Will continue to rest and take his medications for the next two days.  Pt. Will continue to comply with Plan of Care toward Discharge.  Pt. Will continue to stay safe on the unit.

## 2023-02-09 NOTE — Behavioral Health Treatment Team (Signed)
Pt slept overnight  ,remained Sleeping at this time , changing position in bed by himself,  No violent no self harming behavior noticed or reported

## 2023-02-10 MED FILL — OLANZAPINE 10 MG PO TBDP: 10 MG | ORAL | Qty: 1

## 2023-02-10 MED FILL — CEPHALEXIN 250 MG PO CAPS: 250 MG | ORAL | Qty: 2

## 2023-02-10 NOTE — Behavioral Health Treatment Team (Signed)
Behavioral Health Treatment Team Note     Patient goal(s) for today: Pt not able to verbalize goal.  Treatment team focus/goals: medication management, safe discharge planning, attend groups and activities daily    Progress note: Pt observed seated in bed. Pt alert and oriented to staff. Pt is able to respond to some questions and was able to make a statement today "Feeling better every day." Pt making some improvements on ADLS. Inpatient level of care is needed in order to stabilize pt further on medications.     LOS:  6  Expected LOS: TDO up to 30 days    Insurance info/prescription coverage:  Medicaid  Date of last family contact:  Staff spoke to pt's wife over the weekend (see note by [employer] on 05/04)  Family requesting physician contact today:  No  Discharge plan:  TBD, recommended possible inpatient rehab  Guns in the home:  No- will need to confirm with wife prior to discharge   Outpatient provider(s):  Unknown, none at this time    Participating treatment team members: [name], Serena Croissant, [facility]

## 2023-02-10 NOTE — Behavioral Health Treatment Team (Signed)
BEHAVIORAL HEALTH GROUP NOTE FOR NON ATTENDEES        DATE: 02/10/2023      GROUP START: 1000     GROUP END: 1045          GROUP TYPE: Community Meeting        ATTENDANCE: Refused to participate          SIGNATURE:  Shacarra Choe

## 2023-02-10 NOTE — Group Note (Signed)
Group Therapy Note    Date: 02/10/2023    Group Start Time: 1345  Group End Time: 1430  Group Topic: Process Group - Inpatient    [facility]    Lucas Hicks        Group Therapy Note    Attendees: 3/5    Writer facilitated an inpatient processing group. Writer had patients engage in an expressive arts warm up activity in which patients were asked to discuss "mountains and valleys" as a Publishing rights manager of highs and lows in life and how to manage them. Writer encouraged patients to discuss discharge plans, express feelings, etc. Writer held the space as patients processed. Writer concluded session with a grounding exercise and encouraging patients to provide input and feedback regarding the group.        Pt not able to attend group due to symptom acuity.           Signature:  Serena Croissant

## 2023-02-10 NOTE — Behavioral Health Treatment Team (Signed)
Pt received standing near his bed.    Pt was incontinent of urine at 1930 and hygiene provided with total assistant. Helped to lay in bed and notice sleeping after that.     Pt  is confused  and oriented to person only was able to tell what his wife name" Lucas Hicks"  .     Dressing intact on his Right index finger.       pt still has lower limbs rigidity while  standing and shuffling gait.     At 2150  Pt offered HS snack and  he ate two spoons apple sauce with his medication nd drank  of grape juice and one spoon rice crisp cereal,  pieces of cookies and drank 4 oz ( ) of grape juice with total assistant , Pt accepted HS medication Olanzapine 10mg  , also   pt accepted Keflex 50mg  Midnight dose by 2313 drank 120 ml  of water and 2 spoon of apple sauce.  Pt breif is dry     Pt start sleeping by 2330 and remained sleeping as of this time, pt breathing spontaneously and changing position in bed by himself.

## 2023-02-10 NOTE — Group Note (Signed)
Group Therapy Note    Date: 02/10/2023    Group Start Time: 1430  Group End Time: 1515  Group Topic: Psychoeducation    [facility], minerals, Amariana Mirando        Group Therapy Note    Attendees: 3/5    Writer facilitated an inpatient psych-ed group. Writer provided a handout regarding the Power and Control wheel. Therapeutic purpose of the group was to teach patients the different types of abuse and warning signs. Writer held the space as patients processed. Writer concluded session with a grounding exercise and encouraging patients to provide input and feedback regarding the group.        Pt not able to attend group due to symptom acuity.           Signature:  Serena Croissant

## 2023-02-10 NOTE — Behavioral Health Treatment Team (Signed)
TREATMENT TEAM COMPLETED     Attending meeting was as follows:  Patient, Lucas Hicks, SW, Royann Shivers, Charge RN and Dr. Bernadette Hoit.       Meeting was conducted via skype      Daily Treatment Team consists of the following:     Pt. Cognitive status:   Alert and Oriented x1    Pt. Attending Groups: No    Pt. Participating in groups:  No, pt condition is too acute    Pt. Homicidal / Suicidal: normal    Pt. Behaviors:  Withdrawn    Pt. Compliant with Medications:  Yes          New Medication orders:  No      Discharge Plan:  Unknown at this time.     Pt denies SI/HI, A/V hallucinations, and anxiety and depression. Pt states, "Everyday I'm feeling better." Pt has been eating and drinking fluids. Pt continues to be incontinent and confused. Pt is able to respond to voice, but doesn't always answer questions. Pt was repeatedly sitting on the floor, staff helped patient into the bed to prevent any injuries. No c/o pain or any other issues, will continue to monitor.

## 2023-02-10 NOTE — Behavioral Health Treatment Team (Signed)
B:   Patient alert and oriented x 1.   Pt. States depression is 0/10.  Pt. States Anxiety is 0/10.  Pt. denies Hallucinations.  Pt. denies Delusions.  Pt. denies SI.  Pt. denies HI.   Pt. cooperative with Assessment.  Pt.'s behavior Withdrawn.   Pt is able to communicate with staff this evening, says he is not having pain.    I:    If patient is disoriented, reorient pt.  Build trust with patient, by therapeutic listening and Groups.  Encourage pt. To attend and Participate in Groups.  Provide Medications as ordered and needed.  Encourage pt. To be up for all meals and snacks, and consume all of each. Encourage pt. To interact with staff and peers in a positive manner.  Encourage pt. To keep good hygiene.  Q 15 minute safety checks.    R:   Pt. did not attend and Participate in Group.  Pt. Is Compliant with Medications   Yes.   Pt. is getting up for meals and snacks.  Pt. Consumes 50% of Meals.  Pt. is not, interacting with Peers.   Pt.'s hygiene is Poor.  Pt. does not, have any safety issues.    P:   Pt. Will develop and continue to utilize positive Coping skills.  Pt. Will continue to comply with Plan of Care toward Discharge.  Pt. Will continue to stay safe on the unit.

## 2023-02-10 NOTE — Behavioral Health Treatment Team (Signed)
BEHAVIORAL HEALTH GROUP NOTE FOR NON ATTENDEES        DATE: 02/10/2023      GROUP START: 2000    GROUP END: 2045          GROUP TYPE: Wrap-Up        ATTENDANCE: Not appropriate for group due to sympton severity          SIGNATURE:  Shelby Dubin, CNA

## 2023-02-10 NOTE — Group Note (Incomplete)
Group Therapy Note    Date: 02/10/2023    Group Start Time: 1100  Group End Time: 1145  Group Topic: Medication    [facility]    Melida Quitter, LPN        Group Therapy Note    Attendees: 4         Patient's Goal:  ***    Notes:  ***    Status After Intervention:  {Status After Intervention:304201511}    Participation Level: {Participation Level:304201512}    Participation Quality: {MH BHI PARTICIPATION QUALITY:304201513}      Speech:  {ED MH CD_SPEECH:18291}      Thought Process/Content: {Thought Process/Content:304201514}      Affective Functioning: {Affective Functioning:304201515}      Mood: {Mood:210450006}      Level of consciousness:  {Level of consciousness:304201516}      Response to Learning: {MH BHI Responses to Learning:304201521}      Endings: {MH BHI Endings:25257}    Modes of Intervention: {MH BHI Modes of Intervention:304201520}      Discipline Responsible: {MH BHI Multidisciplinary:304000567}      Signature:  Melida Quitter, LPN

## 2023-02-10 NOTE — Plan of Care (Signed)
Problem: Discharge Planning  Goal: Discharge to home or other facility with appropriate resources  Outcome: Progressing     Problem: Pain  Goal: Verbalizes/displays adequate comfort level or baseline comfort level  02/10/2023 0413 by Estanislado Pandy, RN  Outcome: Progressing  02/09/2023 1601 by Royann Shivers, RN  Outcome: Progressing     Problem: Risk for Elopement  Goal: Patient will not exit the unit/facility without proper excort  02/10/2023 0413 by Estanislado Pandy, RN  Outcome: Progressing  02/09/2023 1601 by Royann Shivers, RN  Outcome: Progressing     Problem: Safety - Adult  Goal: Free from fall injury  02/10/2023 0413 by Estanislado Pandy, RN  Outcome: Progressing  02/09/2023 1601 by Royann Shivers, RN  Outcome: Progressing     Problem: Chronic Conditions and Co-morbidities  Goal: Patient's chronic conditions and co-morbidity symptoms are monitored and maintained or improved  02/10/2023 0413 by Estanislado Pandy, RN  Outcome: Progressing  02/09/2023 1601 by Royann Shivers, RN  Outcome: Progressing     Problem: Skin/Tissue Integrity  Goal: Absence of new skin breakdown  Description: 1.  Monitor for areas of redness and/or skin breakdown  2.  Assess vascular access sites hourly  3.  Every 4-6 hours minimum:  Change oxygen saturation probe site  4.  Every 4-6 hours:  If on nasal continuous positive airway pressure, respiratory therapy assess nares and determine need for appliance change or resting period.  02/10/2023 0413 by Estanislado Pandy, RN  Outcome: Progressing  02/09/2023 1601 by Royann Shivers, RN  Outcome: Progressing     Problem: Depression  Goal: Will be euthymic at discharge  Description: INTERVENTIONS:  1. Administer medication as ordered  2. Provide emotional support via 1:1 interaction with staff  3. Encourage involvement in milieu/groups/activities  4. Monitor for social isolation  02/09/2023 1601 by Royann Shivers, RN  Outcome: Progressing     Problem: Sleep  Disturbance  Goal: Will exhibit normal sleeping pattern  Description: INTERVENTIONS:  1. Administer medication as ordered  2. Decrease environmental stimuli, including noise, as appropriate  3. Discourage social isolation and naps during the day  Outcome: Progressing     Problem: Involuntary Admit  Goal: Will cooperate with staff recommendations and doctor's orders and will demonstrate appropriate behavior  Description: INTERVENTIONS:  1. Treat underlying conditions and offer medication as ordered  2. Educate regarding involuntary admission procedures and rules  3. Contain excessive/inappropriate behavior per unit and hospital policies  Outcome: Progressing     Problem: Self Care Deficit  Goal: Return ADL status to a safe level of function  Description: INTERVENTIONS:  1. Administer medication as ordered  2. Assess ADL deficits and provide assistive devices as needed  3. Obtain PT/OT consults as needed  4. Assist and instruct patient to increase activity and self care as tolerated  Outcome: Progressing

## 2023-02-10 NOTE — Behavioral Health Treatment Team (Signed)
Received call for a lady identifying her self as patient wife  " Axavier Partridge' as she identified her self . 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

## 2023-02-10 NOTE — Progress Notes (Signed)
Psychiatric Progress Note      Patient: Lucas Hicks MRN: [id number]  SSN: [id number]    Date of Birth: 1981/01/27  Age: 42 y.o.  Sex: male      Admit Date: 02/04/2023       Subjective:     Lucas Hicks stated that he is feeling better.  Patient was able to participate in interview for some time.  Mostly responding by one-word answers.  He was not able to provide details related to the events that happened prior to coming to the hospital.  He reported feeling anxious sometimes.  He reported some improvement with his appetite.      Objective:     Vitals:    02/05/23 1530 02/06/23 1015 02/06/23 1339 02/06/23 1548   BP: 131/77 (!) 98/59 127/77 116/66   Pulse: 77 72 78 63   Resp: 16 16 16 16    Temp: 97.7 F (36.5 C) 98.6 F (37 C) 98.7 F (37.1 C) 97.8 F (36.6 C)   TempSrc: Temporal Temporal Temporal Temporal   SpO2: 99% 97% 97% 99%   Weight:       Height:            Mental Status Exam:     Patient is alert and oriented to self.  Patient with poor personal hygiene.  Speech- slow/sparse.  Patient's mood appears to be anxious.  He has constricted affect.  He did not answer questions related to suicidal or homicidal ideations.      MEDICATIONS:  Current Facility-Administered Medications   Medication Dose Route Frequency    acetaminophen (TYLENOL) tablet 650 mg  650 mg Oral Q4H PRN    polyethylene glycol (GLYCOLAX) packet 17 g  17 g Oral Daily PRN    hydrOXYzine HCl (ATARAX) tablet 50 mg  50 mg Oral TID PRN    haloperidol (HALDOL) tablet 5 mg  5 mg Oral Q4H PRN    Or    haloperidol lactate (HALDOL) injection 5 mg  5 mg IntraMUSCular Q4H PRN    diphenhydrAMINE (BENADRYL) injection 50 mg  50 mg IntraMUSCular Q4H PRN    traZODone (DESYREL) tablet 50 mg  50 mg Oral Nightly PRN    aluminum & magnesium hydroxide-simethicone (MAALOX) 200-200-20 MG/5ML suspension 30 mL  30 mL Oral Q6H PRN    cephALEXin (KEFLEX) capsule 500 mg  500 mg Oral 4 times per day        DISCUSSION:  Discussed risk and benefits of medication,  provided an opportunity to answer any questions, reviewed discharge goals.    Lab/Data Review:  Recent Results (from the past 24 hour(s))   CBC with Auto Differential    Collection Time: 02/06/23 11:40 AM   Result Value Ref Range    WBC 5.0 4.1 - 11.1 K/uL    RBC 4.34 4.10 - 5.70 M/uL    Hemoglobin 12.8 12.1 - 17.0 g/dL    Hematocrit 78.2 95.6 - 50.3 %    MCV 90.1 80.0 - 99.0 FL    MCH 29.5 26.0 - 34.0 PG    MCHC 32.7 30.0 - 36.5 g/dL    RDW 21.3 08.6 - 57.8 %    Platelets 306 150 - 400 K/uL    MPV 9.0 8.9 - 12.9 FL    Nucleated RBCs 0.0 0.0 PER 100 WBC    nRBC 0.00 0.00 - 0.01 K/uL    Neutrophils % 49 32 - 75 %    Lymphocytes % 38 12 - 49 %  Monocytes % 12 5 - 13 %    Eosinophils % 1 0 - 7 %    Basophils % 0 0 - 1 %    Immature Granulocytes % 0 0 - 0.5 %    Neutrophils Absolute 2.4 1.8 - 8.0 K/UL    Lymphocytes Absolute 1.9 0.8 - 3.5 K/UL    Monocytes Absolute 0.6 0.0 - 1.0 K/UL    Eosinophils Absolute 0.1 0.0 - 0.4 K/UL    Basophils Absolute 0.0 0.0 - 0.1 K/UL    Immature Granulocytes Absolute 0.0 0.00 - 0.04 K/UL    Differential Type AUTOMATED     Ammonia    Collection Time: 02/06/23 11:40 AM   Result Value Ref Range    Ammonia <10 <32 umol/L   Comprehensive Metabolic Panel    Collection Time: 02/06/23 11:40 AM   Result Value Ref Range    Sodium 139 136 - 145 mmol/L    Potassium 4.6 3.5 - 5.1 mmol/L    Chloride 102 97 - 108 mmol/L    CO2 31 21 - 32 mmol/L    Anion Gap 6 5 - 15 mmol/L    Glucose 95 65 - 100 mg/dL    BUN 12 6 - 20 mg/dL    Creatinine 0.98 1.19 - 1.30 mg/dL    Bun/Cre Ratio 13 12 - 20      Est, Glom Filt Rate >90 >60 ml/min/1.31m2    Calcium 9.3 8.5 - 10.1 mg/dL    Total Bilirubin 0.4 0.2 - 1.0 mg/dL    AST 79 (H) 15 - 37 U/L    ALT 183 (H) 12 - 78 U/L    Alk Phosphatase 80 45 - 117 U/L    Total Protein 7.3 6.4 - 8.2 g/dL    Albumin 3.2 (L) 3.5 - 5.0 g/dL    Globulin 4.1 (H) 2.0 - 4.0 g/dL    Albumin/Globulin Ratio 0.8 (L) 1.1 - 2.2     CK    Collection Time: 02/06/23 11:40 AM   Result Value Ref  Range    Total CK 100 39 - 308 U/L   Myoglobin, Blood    Collection Time: 02/06/23 11:40 AM   Result Value Ref Range    Myoglobin 58 16 - 116 ng/mL   Lactic Acid    Collection Time: 02/06/23 11:40 AM   Result Value Ref Range    Lactic Acid, Plasma 1.2 0.4 - 2.0 mmol/L   Hemoglobin A1C    Collection Time: 02/06/23 11:40 AM   Result Value Ref Range    Hemoglobin A1C 5.4 4.0 - 5.6 %    Estimated Avg Glucose 108 mg/dL   Lipid Panel    Collection Time: 02/06/23 11:40 AM   Result Value Ref Range    LIPID PANEL        Cholesterol, Total 143 <200 mg/dL    Triglycerides 83 <147 mg/dL    HDL 38 mg/dL    LDL Cholesterol 82.9 0 - 100 mg/dL    VLDL Cholesterol Calculated 16.6 mg/dL    Chol/HDL Ratio 3.8 0.0 - 5.0     Magnesium    Collection Time: 02/06/23 11:40 AM   Result Value Ref Range    Magnesium 1.9 1.6 - 2.4 mg/dL   TSH    Collection Time: 02/06/23 11:40 AM   Result Value Ref Range    TSH, 3rd Generation 0.85 0.36 - 3.74 uIU/mL   Troponin    Collection Time: 02/06/23 11:40 AM   Result  Value Ref Range    Troponin, High Sensitivity 8 0 - 76 ng/L   EKG 12 Lead    Collection Time: 02/06/23  3:19 PM   Result Value Ref Range    Ventricular Rate 73 BPM    Atrial Rate 73 BPM    P-R Interval 142 ms    QRS Duration 97 ms    Q-T Interval 417 ms    QTc Calculation (Bazett) 460 ms    P Axis -3 degrees    R Axis 83 degrees    T Axis 61 degrees    Diagnosis       Sinus rhythm  Probable left ventricular hypertrophy  ST elev, probable normal early repol pattern  Baseline wander in lead(s) I,II,aVR,aVL,aVF,V1,V2,V3,V4,V5,V6     Urinalysis with Reflex to Culture    Collection Time: 02/07/23  6:25 AM    Specimen: Urine   Result Value Ref Range    Color, UA Yellow/Straw      Appearance Clear Clear      Specific Gravity, UA >1.030 (H) 1.003 - 1.030    pH, Urine 6.0 5.0 - 8.0      Protein, UA Trace (A) Negative mg/dL    Glucose, Ur Negative Negative mg/dL    Ketones, Urine 40 (A) Negative mg/dL    Blood, Urine Negative Negative       Urobilinogen, Urine 0.2 0.2 - 1.0 EU/dL    Nitrite, Urine Negative Negative      Leukocyte Esterase, Urine Negative Negative      WBC, UA 0-4 0 - 5 /hpf    RBC, UA 0-5 0 - 3 /hpf    BACTERIA, URINE 1+ (A) Negative /hpf    Urine Culture if Indicated Culture not indicated by UA result Culture not indicated by UA result      Calcium Oxalate 3+ (A) Negative   Bilirubin, Confirmatory    Collection Time: 02/07/23  6:25 AM   Result Value Ref Range    Bilirubin Confirmation, UA Negative      Specific Gravity, UA >1.030 (H) 1.003 - 1.030           Assessment:     Principal Problem:    Psychosis, unspecified psychosis type (HCC)  Active Problems:    Urine frequency  Resolved Problems:    * No resolved hospital problems. *    Major depressive disorder by history  Generalized anxiety disorder by history  Stimulant/cocaine use disorder severity unspecified  History of opiate use disorder  Rule out sedative-hypnotic use disorder  Rule out delirium        Patient admitted under a TDO for disorganized behaviors.  Likely secondary to substance abuse.    Plan:   Continue current medications.  Patient was encouraged to take his medications   monitor oral intake.  Monitor for withdrawal symptoms  Obtain collateral information  Continue with therapy        Signed By: Lajuana Carry MD Psychiatry     Feb 07, 2023

## 2023-02-11 LAB — COMPREHENSIVE METABOLIC PANEL
ALT: 103 U/L — ABNORMAL HIGH (ref 12–78)
AST: 49 U/L — ABNORMAL HIGH (ref 15–37)
Albumin/Globulin Ratio: 0.8 — ABNORMAL LOW (ref 1.1–2.2)
Albumin: 3.3 g/dL — ABNORMAL LOW (ref 3.5–5.0)
Alk Phosphatase: 78 U/L (ref 45–117)
Anion Gap: 6 mmol/L (ref 5–15)
BUN/Creatinine Ratio: 11 — ABNORMAL LOW (ref 12–20)
BUN: 10 mg/dL (ref 6–20)
CO2: 30 mmol/L (ref 21–32)
Calcium: 9.4 mg/dL (ref 8.5–10.1)
Chloride: 104 mmol/L (ref 97–108)
Creatinine: 0.93 mg/dL (ref 0.70–1.30)
Est, Glom Filt Rate: >90 mL/min/{1.73_m2} (ref >60)
Globulin: 3.9 g/dL (ref 2.0–4.0)
Glucose: 108 mg/dL — ABNORMAL HIGH (ref 65–100)
Potassium: 4.5 mmol/L (ref 3.5–5.1)
Sodium: 140 mmol/L (ref 136–145)
Total Bilirubin: 0.4 mg/dL (ref 0.2–1.0)
Total Protein: 7.2 g/dL (ref 6.4–8.2)

## 2023-02-11 LAB — CBC
Hematocrit: 40.9 % (ref 36.6–50.3)
Hemoglobin: 13.3 g/dL (ref 12.1–17.0)
MCH: 29.4 PG (ref 26.0–34.0)
MCHC: 32.5 g/dL (ref 30.0–36.5)
MCV: 90.5 FL (ref 80.0–99.0)
MPV: 9.6 FL (ref 8.9–12.9)
Nucleated RBCs: 0 PER 100 WBC
Platelets: 254 10*3/uL (ref 150–400)
RBC: 4.52 M/uL (ref 4.10–5.70)
RDW: 14.6 % — ABNORMAL HIGH (ref 11.5–14.5)
WBC: 5.5 10*3/uL (ref 4.1–11.1)
nRBC: 0 10*3/uL (ref 0.00–0.01)

## 2023-02-11 MED FILL — CEPHALEXIN 250 MG PO CAPS: 250 MG | ORAL | Qty: 2

## 2023-02-11 MED FILL — OLANZAPINE 10 MG PO TBDP: 10 MG | ORAL | Qty: 1

## 2023-02-11 NOTE — Behavioral Health Treatment Team (Signed)
Pt's wife called asking how pt. Was doing.  Writer , per Dr. Bernadette Hoit, told pt. His status at this time is about the same, still cognitive deficits,  eating small amounts, taking in small amount of liquids with staff assist.  Remains incontinent.  Wife asked what the dr. Queen Blossom was going on with him, writer explained, he is still evaluating him along with the medical dr.  And it appeared as though for a couple of days that the pt. Was improving, a little each time with the zyprexa, however, today, pt. Is not quite as cognitive.  Visit was mentioned, Wife states she could come next week to visit.  Information passed along to Child psychotherapist and Press photographer.

## 2023-02-11 NOTE — Progress Notes (Signed)
Pt slept well tonight with no complaints voiced and no problems noted on rounds. Safe on unit will continue to monitor.

## 2023-02-11 NOTE — Behavioral Health Treatment Team (Signed)
Behavioral Health Treatment Team Note     Patient goal(s) for today: Not able to verbalize  Treatment team focus/goals: medication management, safe discharge planning, attend groups and activities daily    Progress note: Pt observed sleeping. Pt not responding to verbal prompts. Per collateral, pt has made improvements with oral intake and taking medications. Unable to assess for SI, HI, AVH. Inpatient level of care is needed in order to stabilize pt further on medications.     LOS:  7  Expected LOS: TDO up to 30 days    Insurance info/prescription coverage:  Medicaid  Date of last family contact:  Staff will coordinate with pt's wife for a possible visit prior to discharge.  Family requesting physician contact today:  No  Discharge plan:  TBD- recommended possible inpatient rehab  Guns in the home:  No  Outpatient provider(s):  Unknown at this time    Participating treatment team members: [name], Serena Croissant, [facility]

## 2023-02-11 NOTE — Behavioral Health Treatment Team (Signed)
B:   Patient alert and oriented x 1.   Pt. States depression is 0/10.  Pt. States Anxiety is 0/10.  Pt. denies Hallucinations.  Pt. denies Delusions.  Pt. denies SI.  Pt. denies HI.   Pt. cooperative with Assessment.  Pt.'s behavior Cooperative.   Pt has been resting in bed since the beginning of this shift. Staff on previous shift and helped him to eat and do ADL's at shift change. Pt says he is feeling comfortable.    I:    If patient is disoriented, reorient pt.  Build trust with patient, by therapeutic listening and Groups.  Encourage pt. To attend and Participate in Groups.  Provide Medications as ordered and needed.  Encourage pt. To be up for all meals and snacks, and consume all of each. Encourage pt. To interact with staff and peers in a positive manner.  Encourage pt. To keep good hygiene.  Q 15 minute safety checks.    R:   Pt. did not attend and Participate in Group.  Pt. Is Compliant with Medications   Yes.   Pt. is not getting up for meals and snacks.  Pt. Consumes 75% of Meals.  Pt. is not, interacting with Peers.   Pt.'s hygiene is Fair.  Pt. does not, have any safety issues.    P:   Pt. Will develop and continue to utilize positive Coping skills.  Pt. Will continue to comply with Plan of Care toward Discharge.  Pt. Will continue to stay safe on the unit.

## 2023-02-11 NOTE — Progress Notes (Signed)
Psychiatric Progress Note      Patient: Lucas Hicks MRN: [id number]  SSN: [id number]    Date of Birth: 12-22-1980  Age: 42 y.o.  Sex: male      Admit Date: 02/04/2023       Subjective:     Lucas Hicks   patient sleeping in his room and did not respond to verbal stimuli.  Not able to participate in the interview.  Per collateral information from staff: Patient with some improvement with his oral intake and also taking his medications.  More awake in the afternoon.    Objective:     Vitals:    02/09/23 1930 02/10/23 0600 02/10/23 1615 02/11/23 0615   BP:  113/65 125/69 (!) 149/74   Pulse: 70 63 74 73   Resp:  16 17 20    Temp:  97.8 F (36.6 C) 97.8 F (36.6 C) 97.6 F (36.4 C)   TempSrc:  Temporal Temporal Temporal   SpO2:  99% 98% 98%   Weight:       Height:            Mental Status Exam:   Patient is drowsy and not able to participate in the interview.      MEDICATIONS:  Current Facility-Administered Medications   Medication Dose Route Frequency    OLANZapine zydis (ZYPREXA) disintegrating tablet 10 mg  10 mg Oral Nightly    acetaminophen (TYLENOL) tablet 650 mg  650 mg Oral Q4H PRN    polyethylene glycol (GLYCOLAX) packet 17 g  17 g Oral Daily PRN    hydrOXYzine HCl (ATARAX) tablet 50 mg  50 mg Oral TID PRN    haloperidol (HALDOL) tablet 5 mg  5 mg Oral Q4H PRN    Or    haloperidol lactate (HALDOL) injection 5 mg  5 mg IntraMUSCular Q4H PRN    diphenhydrAMINE (BENADRYL) injection 50 mg  50 mg IntraMUSCular Q4H PRN    traZODone (DESYREL) tablet 50 mg  50 mg Oral Nightly PRN    aluminum & magnesium hydroxide-simethicone (MAALOX) 200-200-20 MG/5ML suspension 30 mL  30 mL Oral Q6H PRN    cephALEXin (KEFLEX) capsule 500 mg  500 mg Oral 4 times per day        DISCUSSION:  Discussed risk and benefits of medication, provided an opportunity to answer any questions, reviewed discharge goals.    Lab/Data Review:  No results found for this or any previous visit (from the past 24 hour(s)).          Assessment:      Principal Problem:    Psychosis, unspecified psychosis type (HCC)  Active Problems:    Urine frequency  Resolved Problems:    * No resolved hospital problems. *    Major depressive disorder by history  Generalized anxiety disorder by history  Stimulant/cocaine use disorder severity unspecified  History of opiate use disorder  Rule out sedative-hypnotic use disorder  Rule out delirium        Patient admitted under a TDO for disorganized behaviors.  Likely secondary to substance abuse.    Plan:   Continue current medications.  monitor oral intake.  Obtain collateral information  Continue with therapy        Signed By: Lajuana Carry MD Psychiatry     Feb 11, 2023

## 2023-02-11 NOTE — Group Note (Signed)
Group Therapy Note    Date: 02/11/2023    Group Start Time: 1330  Group End Time: 1415  Group Topic: Process Group - Inpatient    [facility]    Janaisa Birkland        Group Therapy Note    Attendees: 3/5    Writer facilitated an inpatient processing group. Writer had patients choose a significant date to them and a quote associated with it. Writer encouraged patient to share their feelings regarding the quote, discuss discharge plans, process any feelings they may be having, etc. Writer held the space as patients processed. Writer concluded session with a grounding exercise and encouraging patients to provide input and feedback regarding the group.        Pt symptoms too acute in nature to attend.         Signature:  Serena Croissant

## 2023-02-11 NOTE — Behavioral Health Treatment Team (Signed)
Pt has been eating less, speaking less, and wanting to sleep more often. VS were checked and were WNL. Dr. Bernadette Hoit notified, labs ordered, waiting for results.

## 2023-02-11 NOTE — Care Coordination-Inpatient (Signed)
02/11/23 1208   Short Term Goal 1   STG Goal 1 Status: Patient Appears to be  Partially meeting treatment plan goal   Short Term Goal 2   STG Goal 2 Status: Patient Appears to be  Partially meeting treatment plan goal   Crisis/Safety/Discharge Plan   Discharge Plan TBD

## 2023-02-11 NOTE — Group Note (Signed)
Group Therapy Note    Date: 02/11/2023    Group Start Time: 1415  Group End Time: 1500  Group Topic: Psychoeducation    [facility], minerals, Maximino Cozzolino        Group Therapy Note    Attendees: 3/5    Writer facilitated an inpatient psych-ed group. Writer provided education regarding long term and short term goals and had patients engage in a vision board activity. Writer held the space as patients processed. Writer concluded session with a grounding exercise and encouraging patients to provide input and feedback regarding the group.        Pt not able to attend due to symptom acuity.         Signature:  Serena Croissant

## 2023-02-11 NOTE — Behavioral Health Treatment Team (Signed)
Pt. Had  trouble waking up, and when was partially awake, pt. Refused.    Lab will attempt to redraw this afternoon.

## 2023-02-11 NOTE — Behavioral Health Treatment Team (Signed)
BEHAVIORAL HEALTH GROUP NOTE FOR NON ATTENDEES        DATE: 02/11/2023      GROUP START: 2000    GROUP END: 2045          GROUP TYPE: Wrap-Up        ATTENDANCE: Not appropriate for group due to sympton severity          SIGNATURE:  Shelby Dubin, CNA

## 2023-02-11 NOTE — Behavioral Health Treatment Team (Signed)
TREATMENT TEAM COMPLETED     Attending meeting was as follows:  Patient, Lucas Hicks, SW, Writer, and [place]  .       Meeting was conducted via skype      Daily Treatment Team consists of the following:     It was discussed about bringing his wife in to visit when pt. Is cognitive enough to understand, that it may help him draw out of his confusion, disorientation. Writer will speak with wife today.    Pt. Cognitive status:   sleepy, would not talk to the dr.    Pt. Attending Groups: No, unable to at this time due to illness    Pt. Participating in groups:  No, uknable to due to illness    Pt. Homicidal / Suicidal: denies    Pt. Behaviors:  Other pt. Cognitive deficit, cooperative at this time with prompts from staff.*    Pt. Compliant with Medications:  Yes          New Medication orders:  No      Discharge Plan:  Home

## 2023-02-12 LAB — COMPREHENSIVE METABOLIC PANEL
ALT: 105 U/L — ABNORMAL HIGH (ref 12–78)
AST: 44 U/L — ABNORMAL HIGH (ref 15–37)
Albumin/Globulin Ratio: 0.8 — ABNORMAL LOW (ref 1.1–2.2)
Albumin: 3.3 g/dL — ABNORMAL LOW (ref 3.5–5.0)
Alk Phosphatase: 76 U/L (ref 45–117)
Anion Gap: 6 mmol/L (ref 5–15)
BUN/Creatinine Ratio: 11 — ABNORMAL LOW (ref 12–20)
BUN: 10 mg/dL (ref 6–20)
CO2: 29 mmol/L (ref 21–32)
Calcium: 9.6 mg/dL (ref 8.5–10.1)
Chloride: 102 mmol/L (ref 97–108)
Creatinine: 0.91 mg/dL (ref 0.70–1.30)
Est, Glom Filt Rate: >90 mL/min/{1.73_m2} (ref >60)
Globulin: 4.1 g/dL — ABNORMAL HIGH (ref 2.0–4.0)
Glucose: 114 mg/dL — ABNORMAL HIGH (ref 65–100)
Potassium: 4.8 mmol/L (ref 3.5–5.1)
Sodium: 137 mmol/L (ref 136–145)
Total Bilirubin: 0.4 mg/dL (ref 0.2–1.0)
Total Protein: 7.4 g/dL (ref 6.4–8.2)

## 2023-02-12 LAB — CBC
Hematocrit: 42.8 % (ref 36.6–50.3)
Hemoglobin: 14 g/dL (ref 12.1–17.0)
MCH: 29.7 PG (ref 26.0–34.0)
MCHC: 32.7 g/dL (ref 30.0–36.5)
MCV: 90.9 FL (ref 80.0–99.0)
MPV: 9.4 FL (ref 8.9–12.9)
Nucleated RBCs: 0 PER 100 WBC
Platelets: 249 10*3/uL (ref 150–400)
RBC: 4.71 M/uL (ref 4.10–5.70)
RDW: 14.6 % — ABNORMAL HIGH (ref 11.5–14.5)
WBC: 5.4 10*3/uL (ref 4.1–11.1)
nRBC: 0 10*3/uL (ref 0.00–0.01)

## 2023-02-12 MED ORDER — OLANZAPINE 15 MG PO TBDP
15 | Freq: Every evening | ORAL | Status: DC
Start: 2023-02-12 — End: 2023-02-14
  Administered 2023-02-13 – 2023-02-14 (×2): 15 mg via ORAL

## 2023-02-12 MED FILL — CEPHALEXIN 250 MG PO CAPS: 250 MG | ORAL | Qty: 2

## 2023-02-12 MED FILL — OLANZAPINE 10 MG PO TBDP: 10 MG | ORAL | Qty: 1

## 2023-02-12 MED FILL — HYDROXYZINE HCL 50 MG PO TABS: 50 MG | ORAL | Qty: 1

## 2023-02-12 NOTE — Behavioral Health Treatment Team (Signed)
Dr. Bernadette Hoit notified of patient's current status, agrees with medical bed d/t fall risk and impaired mobility.  No changes to medications.

## 2023-02-12 NOTE — Group Note (Signed)
Group Therapy Note    Date: 02/12/2023    Group Start Time: 1330  Group End Time: 1415  Group Topic: Process Group - Inpatient    [facility]    Tessa Seaberry        Group Therapy Note    Attendees: 3/4    Writer facilitated an inpatient processing group. Writer encouraged patients to express any feelings they may be having, discuss discharge plans, etc. Writer provided an expressive arts/relaxation activity. Writer held the space as patients processed. Writer concluded session with a grounding exercise and encouraging patients to provide input and feedback regarding the group.        Pt not able to attend due to symptom acuity.       Signature:  Serena Croissant

## 2023-02-12 NOTE — Behavioral Health Treatment Team (Signed)
TREATMENT TEAM COMPLETED     Attending meeting was as follows:  Patient, Lucas Hicks, SW, Writer, and Dr. Bernadette Hoit.       Meeting was conducted via Skype      Daily Treatment Team consists of the following:     Pt. Cognitive status:  Awake    Pt. Attending Groups: No    Pt. Participating in groups:  No    Pt. Homicidal / Suicidal: Unable to assess, Pt is not participating in care    Pt. Behaviors:  Pleasant and Other unable to follow complex comands, or respond to most verbal questioning*    Pt. Compliant with Medications:  Yes          New Medication orders:  No      Discharge Plan:  Home

## 2023-02-12 NOTE — Progress Notes (Signed)
Pt rested well tonight, cooperative when taking meds and able to engage in conversation. Safe on unit will continue to monitor.

## 2023-02-12 NOTE — Progress Notes (Signed)
Report received from Lauren CNA. Pt sleeping in bed at this time with no complaints.Bed in lowest position. Continuous monitoring in progress.

## 2023-02-12 NOTE — Behavioral Health Treatment Team (Signed)
BEHAVIORAL HEALTH GROUP NOTE FOR NON ATTENDEES        DATE: 02/12/2023      GROUP START: 0930     GROUP END: 10:15          GROUP TYPE: Community Meeting        ATTENDANCE: Not appropriate for group due to sympton severity          SIGNATURE:  Inez Pilgrim

## 2023-02-12 NOTE — Behavioral Health Treatment Team (Signed)
Behavioral Health Treatment Team Note     Patient goal(s) for today: Unable to assess  Treatment team focus/goals: medication management, safe discharge planning, attend groups and activities daily    Progress note: Pt observed lying in bed. Pt responding minimally to verbal prompts. Pt continues to require assistance with ADLS. Unable to assess SI, HI, AVH. Pt only oriented to self at this time. Inpatient level of care is needed in order to stabilize pt further on medications.    LOS:  8  Expected LOS: TDO up to 30 days     Insurance info/prescription coverage:  Medicaid  Date of last family contact:  Staff spoke with pt's wife yesterday for update  Family requesting physician contact today:  No  Discharge plan:  TBD- recommended possible inpatient rehab  Guns in the home:  No  Outpatient provider(s):  Unknown at this time    Participating treatment team members: Mims Malta, Serena Croissant, [facility]

## 2023-02-12 NOTE — Group Note (Signed)
Group Therapy Note    Date: 02/12/2023    Group Start Time: 1415  Group End Time: 1500  Group Topic: Psychoeducation    [facility]    Lucas Hicks        Group Therapy Note    Attendees: 3/4    Writer facilitated an inpatient psych-ed group. Writer provided a handout regarding resiliency and how to become more psychologically flexible. Writer reviewed aspects such as acceptance, values clarification, etc. Writer encouraged patients to process. Writer concluded session with a grounding exercise and encouraging patients to provide input and feedback regarding the group.        Pt symptoms too acute to attend group.       Signature:  Serena Croissant

## 2023-02-12 NOTE — Behavioral Health Treatment Team (Signed)
B: Pt is alert and oriented to self only. Pt is not cooperative with assessments. He is only able to verbally respond to very specific simple questions for example he is able to state his last name but not his full name. He is able to look at the speaker when told to, but is not able to follow the nurses hand with his eyes.  Pt reports feeling "exhausted".  Pt is not able to identify why they are in the hospital.  He did agree that he does not understand staff conversation with him and many questions/commands that are given to him.    No s/s of physical distress noted. Pt spontaneously is moving all limbs, but cannot follow most instructions.  No complaints of pain.   I: Assess Pt mood.  Document presence of depressive/anxiety symptoms.  Assess for Audio/visual hallucinations.  Establish trust.  Educate Pt on medications and disease processes.  Monitor ADLs, food/fluid consumption and sleep.  Encouarge group participation and socialization.  Educate Pt on medications, coping alternatives, disease processes, and community resources.  Safety checks.  R: Pt mood is stable, calm, euthymic. Pt rates depression 0/10, identifies triggers as none.  Pt rates anxiety at 0/10, but acknowledged he did not understand what "anxiety" meant.  Pt denies SI.  Pt denies HI.  Pt denies audio/visual hallucinations, s/s are not observed by staff.  Pt has required assistance with all ADLs today, including assistance eating.  Pt was incontinent of urine x2, and required staff to change him. Pt is not attending groups and is not interacting appropriately with staff/peers, no aggression or agitation noted.  Pt is eating all meals with assistance, and is not maintaining their personal hygiene.  Pt reports sleeping well.  Pt is compliant wiith medications, however had difficulty swallowing his antibiotics.  P: Encourage cooperation and communication with staff.

## 2023-02-12 NOTE — Progress Notes (Signed)
Psychiatric Progress Note      Patient: Lucas Hicks MRN: [id number]  SSN: [id number]    Date of Birth: Jan 25, 1981  Age: 42 y.o.  Sex: male      Admit Date: 02/04/2023       Subjective:     Lucas Hicks no significant change with his presentation since yesterday.  Mostly sleeping in his room.  Patient is alert but only responding minimally to verbal stimuli.  Requiring assistance with ADLs.  Patient with some improved oral intake and also taking his medications.    Objective:     Vitals:    02/10/23 1615 02/11/23 0615 02/11/23 1600 02/12/23 0615   BP: 125/69 (!) 149/74 125/65 (!) 148/88   Pulse: 74 73 77 65   Resp: 17 20 19 18    Temp: 97.8 F (36.6 C) 97.6 F (36.4 C) 97.8 F (36.6 C) 98 F (36.7 C)   TempSrc: Temporal Temporal Temporal Temporal   SpO2: 98% 98% 99% 98%   Weight:       Height:            Mental Status Exam:   Patient is alert and only oriented to self.      MEDICATIONS:  Current Facility-Administered Medications   Medication Dose Route Frequency    OLANZapine zydis (ZYPREXA) disintegrating tablet 10 mg  10 mg Oral Nightly    acetaminophen (TYLENOL) tablet 650 mg  650 mg Oral Q4H PRN    polyethylene glycol (GLYCOLAX) packet 17 g  17 g Oral Daily PRN    hydrOXYzine HCl (ATARAX) tablet 50 mg  50 mg Oral TID PRN    haloperidol (HALDOL) tablet 5 mg  5 mg Oral Q4H PRN    Or    haloperidol lactate (HALDOL) injection 5 mg  5 mg IntraMUSCular Q4H PRN    diphenhydrAMINE (BENADRYL) injection 50 mg  50 mg IntraMUSCular Q4H PRN    traZODone (DESYREL) tablet 50 mg  50 mg Oral Nightly PRN    aluminum & magnesium hydroxide-simethicone (MAALOX) 200-200-20 MG/5ML suspension 30 mL  30 mL Oral Q6H PRN    cephALEXin (KEFLEX) capsule 500 mg  500 mg Oral 4 times per day        DISCUSSION:  Discussed risk and benefits of medication, provided an opportunity to answer any questions, reviewed discharge goals.    Lab/Data Review:  Recent Results (from the past 24 hour(s))   CBC    Collection Time: 02/12/23  7:25 AM    Result Value Ref Range    WBC 5.4 4.1 - 11.1 K/uL    RBC 4.71 4.10 - 5.70 M/uL    Hemoglobin 14.0 12.1 - 17.0 g/dL    Hematocrit 46.9 62.9 - 50.3 %    MCV 90.9 80.0 - 99.0 FL    MCH 29.7 26.0 - 34.0 PG    MCHC 32.7 30.0 - 36.5 g/dL    RDW 52.8 (H) 41.3 - 14.5 %    Platelets 249 150 - 400 K/uL    MPV 9.4 8.9 - 12.9 FL    Nucleated RBCs 0.0 0.0 PER 100 WBC    nRBC 0.00 0.00 - 0.01 K/uL   Comprehensive Metabolic Panel    Collection Time: 02/12/23  7:25 AM   Result Value Ref Range    Sodium 137 136 - 145 mmol/L    Potassium 4.8 3.5 - 5.1 mmol/L    Chloride 102 97 - 108 mmol/L    CO2 29 21 - 32 mmol/L  Anion Gap 6 5 - 15 mmol/L    Glucose 114 (H) 65 - 100 mg/dL    BUN 10 6 - 20 mg/dL    Creatinine 1.61 0.96 - 1.30 mg/dL    Bun/Cre Ratio 11 (L) 12 - 20      Est, Glom Filt Rate >90 >60 ml/min/1.26m2    Calcium 9.6 8.5 - 10.1 mg/dL    Total Bilirubin 0.4 0.2 - 1.0 mg/dL    AST 44 (H) 15 - 37 U/L    ALT 105 (H) 12 - 78 U/L    Alk Phosphatase 76 45 - 117 U/L    Total Protein 7.4 6.4 - 8.2 g/dL    Albumin 3.3 (L) 3.5 - 5.0 g/dL    Globulin 4.1 (H) 2.0 - 4.0 g/dL    Albumin/Globulin Ratio 0.8 (L) 1.1 - 2.2               Assessment:     Principal Problem:    Psychosis, unspecified psychosis type (HCC)  Active Problems:    Urine frequency  Resolved Problems:    * No resolved hospital problems. *    Major depressive disorder by history  Generalized anxiety disorder by history  Stimulant/cocaine use disorder severity unspecified  History of opiate use disorder  Rule out sedative-hypnotic use disorder  Rule out delirium        Patient admitted under a TDO for disorganized behaviors.  Likely secondary to substance abuse.    Plan:   Increase olanzapine to 15 mg p.o. bedtime  monitor oral intake.  Obtain collateral information  Continue with therapy        Signed By: Lajuana Carry MD Psychiatry     Feb 12, 2023

## 2023-02-13 LAB — COMPREHENSIVE METABOLIC PANEL
ALT: 92 U/L — ABNORMAL HIGH (ref 12–78)
AST: 37 U/L (ref 15–37)
Albumin/Globulin Ratio: 0.8 — ABNORMAL LOW (ref 1.1–2.2)
Albumin: 3.2 g/dL — ABNORMAL LOW (ref 3.5–5.0)
Alk Phosphatase: 73 U/L (ref 45–117)
Anion Gap: 7 mmol/L (ref 5–15)
BUN/Creatinine Ratio: 14 (ref 12–20)
BUN: 14 mg/dL (ref 6–20)
CO2: 29 mmol/L (ref 21–32)
Calcium: 9.5 mg/dL (ref 8.5–10.1)
Chloride: 104 mmol/L (ref 97–108)
Creatinine: 1 mg/dL (ref 0.70–1.30)
Est, Glom Filt Rate: >90 mL/min/{1.73_m2} (ref >60)
Globulin: 3.8 g/dL (ref 2.0–4.0)
Glucose: 116 mg/dL — ABNORMAL HIGH (ref 65–100)
Potassium: 4.2 mmol/L (ref 3.5–5.1)
Sodium: 140 mmol/L (ref 136–145)
Total Bilirubin: 0.3 mg/dL (ref 0.2–1.0)
Total Protein: 7 g/dL (ref 6.4–8.2)

## 2023-02-13 LAB — CBC
Hematocrit: 40.3 % (ref 36.6–50.3)
Hemoglobin: 13.2 g/dL (ref 12.1–17.0)
MCH: 29.4 PG (ref 26.0–34.0)
MCHC: 32.8 g/dL (ref 30.0–36.5)
MCV: 89.8 FL (ref 80.0–99.0)
MPV: 9.6 FL (ref 8.9–12.9)
Nucleated RBCs: 0 PER 100 WBC
Platelets: 256 10*3/uL (ref 150–400)
RBC: 4.49 M/uL (ref 4.10–5.70)
RDW: 14.5 % (ref 11.5–14.5)
WBC: 5.1 10*3/uL (ref 4.1–11.1)
nRBC: 0 10*3/uL (ref 0.00–0.01)

## 2023-02-13 MED ORDER — CEPHALEXIN 250 MG/5ML PO SUSR
250 | Freq: Four times a day (QID) | ORAL | Status: DC
Start: 2023-02-13 — End: 2023-02-15
  Administered 2023-02-14 – 2023-02-15 (×7): 500 mg via ORAL

## 2023-02-13 MED ORDER — CEPHALEXIN 250 MG/5ML PO SUSR
250 | Freq: Four times a day (QID) | ORAL | Status: DC
Start: 2023-02-13 — End: 2023-02-13

## 2023-02-13 MED FILL — CEPHALEXIN 250 MG PO CAPS: 250 MG | ORAL | Qty: 2

## 2023-02-13 MED FILL — OLANZAPINE 15 MG PO TBDP: 15 MG | ORAL | Qty: 1

## 2023-02-13 NOTE — Progress Notes (Signed)
Psychiatric Progress Note      Patient: Clemente Handley MRN: [id number]  SSN: [id number]    Date of Birth: 27-Feb-1981  Age: 42 y.o.  Sex: male      Admit Date: 02/04/2023       Subjective:     Brayan Kennard patient continues to be drowsy during the daytime.  Patient did not respond to verbal stimuli.  Per collateral information from staff patient with some improved oral intake and also taking his medications.    Objective:     Vitals:    02/11/23 1600 02/12/23 0615 02/12/23 1509 02/13/23 0639   BP: 125/65 (!) 148/88 112/70 112/70   Pulse: 77 65 66 76   Resp: 19 18 16 16    Temp: 97.8 F (36.6 C) 98 F (36.7 C) 98 F (36.7 C) 97.8 F (36.6 C)   TempSrc: Temporal Temporal Temporal Temporal   SpO2: 99% 98% 98% 98%   Weight:       Height:            Mental Status Exam:   Patient is drowsy and was not able to participate in the interview.    MEDICATIONS:  Current Facility-Administered Medications   Medication Dose Route Frequency    OLANZapine zydis (ZYPREXA) disintegrating tablet 15 mg  15 mg Oral Nightly    acetaminophen (TYLENOL) tablet 650 mg  650 mg Oral Q4H PRN    polyethylene glycol (GLYCOLAX) packet 17 g  17 g Oral Daily PRN    hydrOXYzine HCl (ATARAX) tablet 50 mg  50 mg Oral TID PRN    haloperidol (HALDOL) tablet 5 mg  5 mg Oral Q4H PRN    Or    haloperidol lactate (HALDOL) injection 5 mg  5 mg IntraMUSCular Q4H PRN    diphenhydrAMINE (BENADRYL) injection 50 mg  50 mg IntraMUSCular Q4H PRN    traZODone (DESYREL) tablet 50 mg  50 mg Oral Nightly PRN    aluminum & magnesium hydroxide-simethicone (MAALOX) 200-200-20 MG/5ML suspension 30 mL  30 mL Oral Q6H PRN    cephALEXin (KEFLEX) capsule 500 mg  500 mg Oral 4 times per day        DISCUSSION:  Discussed risk and benefits of medication, provided an opportunity to answer any questions, reviewed discharge goals.    Lab/Data Review:  Recent Results (from the past 24 hour(s))   CBC    Collection Time: 02/13/23  7:20 AM   Result Value Ref Range    WBC 5.1 4.1 -  11.1 K/uL    RBC 4.49 4.10 - 5.70 M/uL    Hemoglobin 13.2 12.1 - 17.0 g/dL    Hematocrit 13.0 86.5 - 50.3 %    MCV 89.8 80.0 - 99.0 FL    MCH 29.4 26.0 - 34.0 PG    MCHC 32.8 30.0 - 36.5 g/dL    RDW 78.4 69.6 - 29.5 %    Platelets 256 150 - 400 K/uL    MPV 9.6 8.9 - 12.9 FL    Nucleated RBCs 0.0 0.0 PER 100 WBC    nRBC 0.00 0.00 - 0.01 K/uL   Comprehensive Metabolic Panel    Collection Time: 02/13/23  7:20 AM   Result Value Ref Range    Sodium 140 136 - 145 mmol/L    Potassium 4.2 3.5 - 5.1 mmol/L    Chloride 104 97 - 108 mmol/L    CO2 29 21 - 32 mmol/L    Anion Gap 7 5 - 15 mmol/L  Glucose 116 (H) 65 - 100 mg/dL    BUN 14 6 - 20 mg/dL    Creatinine 1.61 0.96 - 1.30 mg/dL    Bun/Cre Ratio 14 12 - 20      Est, Glom Filt Rate >90 >60 ml/min/1.91m2    Calcium 9.5 8.5 - 10.1 mg/dL    Total Bilirubin 0.3 0.2 - 1.0 mg/dL    AST 37 15 - 37 U/L    ALT 92 (H) 12 - 78 U/L    Alk Phosphatase 73 45 - 117 U/L    Total Protein 7.0 6.4 - 8.2 g/dL    Albumin 3.2 (L) 3.5 - 5.0 g/dL    Globulin 3.8 2.0 - 4.0 g/dL    Albumin/Globulin Ratio 0.8 (L) 1.1 - 2.2               Assessment:     Principal Problem:    Psychosis, unspecified psychosis type (HCC)  Active Problems:    Urine frequency  Resolved Problems:    * No resolved hospital problems. *    Major depressive disorder by history  Generalized anxiety disorder by history  Stimulant/cocaine use disorder severity unspecified  History of opiate use disorder  Rule out sedative-hypnotic use disorder  Rule out delirium        Patient admitted under a TDO for disorganized behaviors.  Likely secondary to substance abuse.    Plan:   Continue on olanzapine 15 mg p.o. bedtime  monitor oral intake.  Reviewed labs-within normal limits  Obtain collateral information  Continue with therapy        Signed By: Lajuana Carry MD Psychiatry     Feb 13, 2023

## 2023-02-13 NOTE — Progress Notes (Signed)
Dsg changed to right index finger clean with bedatine applied zero foam dsg cover with sterile gauge site is pink with no drainage 2 little incision.

## 2023-02-13 NOTE — Plan of Care (Signed)
Problem: Discharge Planning  Goal: Discharge to home or other facility with appropriate resources  02/13/2023 2243 by Estanislado Pandy, RN  Outcome: Progressing  02/13/2023 1222 by Fortino Sic, RN  Outcome: Progressing     Problem: Pain  Goal: Verbalizes/displays adequate comfort level or baseline comfort level  02/13/2023 2243 by Estanislado Pandy, RN  Outcome: Progressing  02/13/2023 1222 by Fortino Sic, RN  Outcome: Progressing     Problem: Risk for Elopement  Goal: Patient will not exit the unit/facility without proper excort  02/13/2023 2243 by Estanislado Pandy, RN  Outcome: Progressing  02/13/2023 1222 by Fortino Sic, RN  Outcome: Progressing     Problem: Safety - Adult  Goal: Free from fall injury  02/13/2023 2243 by Estanislado Pandy, RN  Outcome: Progressing  02/13/2023 1222 by Fortino Sic, RN  Outcome: Progressing     Problem: Chronic Conditions and Co-morbidities  Goal: Patient's chronic conditions and co-morbidity symptoms are monitored and maintained or improved  02/13/2023 2243 by Estanislado Pandy, RN  Outcome: Progressing  02/13/2023 1222 by Fortino Sic, RN  Outcome: Progressing     Problem: Skin/Tissue Integrity  Goal: Absence of new skin breakdown  Description: 1.  Monitor for areas of redness and/or skin breakdown  2.  Assess vascular access sites hourly  3.  Every 4-6 hours minimum:  Change oxygen saturation probe site  4.  Every 4-6 hours:  If on nasal continuous positive airway pressure, respiratory therapy assess nares and determine need for appliance change or resting period.  02/13/2023 2243 by Estanislado Pandy, RN  Outcome: Progressing  02/13/2023 1222 by Fortino Sic, RN  Outcome: Progressing     Problem: Depression  Goal: Will be euthymic at discharge  Description: INTERVENTIONS:  1. Administer medication as ordered  2. Provide emotional support via 1:1 interaction with staff  3. Encourage involvement in milieu/groups/activities  4. Monitor  for social isolation  02/13/2023 1222 by Fortino Sic, RN  Outcome: Progressing     Problem: Psychosis  Goal: Will report no hallucinations or delusions  Description: INTERVENTIONS:  1. Administer medication as  ordered  2. Assist with reality testing to support increasing orientation  3. Assess if patient's hallucinations or delusions are encouraging self harm or harm to others and intervene as appropriate  02/13/2023 2243 by Estanislado Pandy, RN  Outcome: Progressing  02/13/2023 1222 by Fortino Sic, RN  Outcome: Progressing     Problem: Behavior  Goal: Pt/Family maintain appropriate behavior and adhere to behavioral management agreement, if implemented  Description: INTERVENTIONS:  1. Assess patient/family's coping skills and  non-compliant behavior (including use of illegal substances)  2. Notify security of behavior or suspected illegal substances which indicate the need for search of the family and/or belongings  3. Encourage verbalization of thoughts and concerns in a socially appropriate manner  4. Utilize positive, consistent limit setting strategies supporting safety of patient, staff and others  5. Encourage participation in the decision making process about the behavioral management agreement  6. If a visitor's behavior poses a threat to safety call refer to organization policy.  7. Initiate consult with Social Worker, Psychosocial CNS, Spiritual Care as appropriate  02/13/2023 2243 by Estanislado Pandy, RN  Outcome: Progressing  02/13/2023 1222 by Fortino Sic, RN  Outcome: Progressing     Problem: Drug Abuse/Detox  Goal: Will have no detox symptoms and will verbalize plan for changing drug-related behavior  Description: INTERVENTIONS:  1. Administer medication as ordered  2. Monitor physical status  3. Provide emotional support with 1:1 interaction with staff  4. Encourage  recovery focused treatment   02/13/2023 1222 by Fortino Sic, RN  Outcome: Progressing     Problem: Sleep  Disturbance  Goal: Will exhibit normal sleeping pattern  Description: INTERVENTIONS:  1. Administer medication as ordered  2. Decrease environmental stimuli, including noise, as appropriate  3. Discourage social isolation and naps during the day  02/13/2023 2243 by Estanislado Pandy, RN  Outcome: Progressing  02/13/2023 1222 by Fortino Sic, RN  Outcome: Progressing     Problem: Involuntary Admit  Goal: Will cooperate with staff recommendations and doctor's orders and will demonstrate appropriate behavior  Description: INTERVENTIONS:  1. Treat underlying conditions and offer medication as ordered  2. Educate regarding involuntary admission procedures and rules  3. Contain excessive/inappropriate behavior per unit and hospital policies  02/13/2023 2243 by Estanislado Pandy, RN  Outcome: Progressing  02/13/2023 1222 by Fortino Sic, RN  Outcome: Progressing     Problem: Self Care Deficit  Goal: Return ADL status to a safe level of function  Description: INTERVENTIONS:  1. Administer medication as ordered  2. Assess ADL deficits and provide assistive devices as needed  3. Obtain PT/OT consults as needed  4. Assist and instruct patient to increase activity and self care as tolerated  02/13/2023 2243 by Estanislado Pandy, RN  Outcome: Progressing  02/13/2023 1222 by Fortino Sic, RN  Outcome: Progressing     Problem: ABCDS Injury Assessment  Goal: Absence of physical injury  02/13/2023 2243 by Estanislado Pandy, RN  Outcome: Progressing  02/13/2023 1222 by Fortino Sic, RN  Outcome: Progressing

## 2023-02-13 NOTE — Plan of Care (Signed)
Problem: Pain  Goal: Verbalizes/displays adequate comfort level or baseline comfort level  02/13/2023 1222 by Fortino Sic, RN  Outcome: Progressing  02/13/2023 0017 by Estanislado Pandy, RN  Outcome: Progressing     Problem: Safety - Adult  Goal: Free from fall injury  02/13/2023 1222 by Fortino Sic, RN  Outcome: Progressing  02/13/2023 0017 by Estanislado Pandy, RN  Outcome: Progressing     Problem: Chronic Conditions and Co-morbidities  Goal: Patient's chronic conditions and co-morbidity symptoms are monitored and maintained or improved  02/13/2023 1222 by Fortino Sic, RN  Outcome: Progressing  02/13/2023 0017 by Estanislado Pandy, RN  Outcome: Progressing     Problem: Skin/Tissue Integrity  Goal: Absence of new skin breakdown  Description: 1.  Monitor for areas of redness and/or skin breakdown  2.  Assess vascular access sites hourly  3.  Every 4-6 hours minimum:  Change oxygen saturation probe site  4.  Every 4-6 hours:  If on nasal continuous positive airway pressure, respiratory therapy assess nares and determine need for appliance change or resting period.  02/13/2023 1222 by Fortino Sic, RN  Outcome: Progressing  02/13/2023 0017 by Estanislado Pandy, RN  Outcome: Progressing     Problem: Psychosis  Goal: Will report no hallucinations or delusions  Description: INTERVENTIONS:  1. Administer medication as  ordered  2. Assist with reality testing to support increasing orientation  3. Assess if patient's hallucinations or delusions are encouraging self harm or harm to others and intervene as appropriate  02/13/2023 1222 by Fortino Sic, RN  Outcome: Progressing  02/13/2023 0017 by Estanislado Pandy, RN  Outcome: Progressing     Problem: Behavior  Goal: Pt/Family maintain appropriate behavior and adhere to behavioral management agreement, if implemented  Description: INTERVENTIONS:  1. Assess patient/family's coping skills and  non-compliant behavior (including use of illegal  substances)  2. Notify security of behavior or suspected illegal substances which indicate the need for search of the family and/or belongings  3. Encourage verbalization of thoughts and concerns in a socially appropriate manner  4. Utilize positive, consistent limit setting strategies supporting safety of patient, staff and others  5. Encourage participation in the decision making process about the behavioral management agreement  6. If a visitor's behavior poses a threat to safety call refer to organization policy.  7. Initiate consult with Social Worker, Psychosocial CNS, Spiritual Care as appropriate  02/13/2023 1222 by Fortino Sic, RN  Outcome: Progressing  02/13/2023 0017 by Estanislado Pandy, RN  Outcome: Progressing     Problem: Involuntary Admit  Goal: Will cooperate with staff recommendations and doctor's orders and will demonstrate appropriate behavior  Description: INTERVENTIONS:  1. Treat underlying conditions and offer medication as ordered  2. Educate regarding involuntary admission procedures and rules  3. Contain excessive/inappropriate behavior per unit and hospital policies  02/13/2023 1222 by Fortino Sic, RN  Outcome: Progressing  02/13/2023 0017 by Estanislado Pandy, RN  Outcome: Progressing     Problem: Self Care Deficit  Goal: Return ADL status to a safe level of function  Description: INTERVENTIONS:  1. Administer medication as ordered  2. Assess ADL deficits and provide assistive devices as needed  3. Obtain PT/OT consults as needed  4. Assist and instruct patient to increase activity and self care as tolerated  02/13/2023 1222 by Fortino Sic, RN  Outcome: Progressing  02/13/2023 0017 by Estanislado Pandy, RN  Outcome: Progressing

## 2023-02-13 NOTE — Progress Notes (Signed)
Report given to [facility] CNA. Pt resting quietly in bed. No complaints at this time.

## 2023-02-13 NOTE — Behavioral Health Treatment Team (Signed)
Officer Mayford Knife, [employer], called to verify the patient is here at this facility, which is confirmed.

## 2023-02-13 NOTE — Behavioral Health Treatment Team (Signed)
Pt received in  his bed awake with 1;1 staff observation for safety.     Pt alert to person only, no SI/HI verbalization made.      Dressing intact on his Right index finger.      Pt was incontinent of urine at 2130 and hygiene provided with total assistant two staff as he tends to to hold on old pull ups and bed sheet .     At 2140   Pt offered HS snack and  ate half cheese [place] sandwich and drank sips of grape juice, and drank 180 ml of water. Pt accepted HS medication and Keflex medication this night.       Pt start sleeping by 2200 .    No violent no  self harming behavior noticed or reported , remained monitored 1:1

## 2023-02-13 NOTE — Behavioral Health Treatment Team (Signed)
TREATMENT TEAM COMPLETED    Attending meeting was as follows:  Patient, Writer, and Dr. Bernadette Hoit. Meeting was conducted via telehealth.    Daily Treatment Team consists of the following:     Pt. Cognitive status:  Disoriented    Pt. Attending Groups: No - unable to participate due to delirium, not following commands.    Pt. Participating in groups:  No    Pt. Homicidal / Suicidal: unable to assess due to patient not participating in assessment verbally on command.    Pt. Behaviors:  Excessive Activity/Restless, Disorganized, and Cooperative    Pt. Compliant with Medications:  Yes  Takes medications well.        New Medication orders:  No      Discharge Plan:  Unable to plan at this time.

## 2023-02-13 NOTE — Behavioral Health Treatment Team (Signed)
Pt received in  his bed awake with 1;1 staff observation for safety.     Pt alert to person only, no SI/HI verbalization made.       Dressing intact on his Right index finger.      Pt was incontinent of urine at 2100 and hygiene provided with total assistant two staff     Pt transferred to a chair at his bed side to allow for serving his snack easily, pt ate half  [place]  sandwich  and drank 120 ml grape juice with his HS medication , pt was fed by staff total assistance, pt able to chew food and swallowing without difficulty.     At 2302 given po prn Atarax 50mg  for anxious  restles behavior, also took his liquid Keflex 500mg  dose, he drank 120 ml water with it, Pt incontinent of urine, hygiene provided with help of two staff total assistance.     Pt offered HS snack and  ate half cheese [place] sandwich and drank sips of grape juice, and drank 180 ml of water. Pt accepted HS medication and Keflex medication this night.      Pt awake at this time      No violent no  self harming behavior noticed or reported , remained monitored 1:1

## 2023-02-13 NOTE — Plan of Care (Signed)
Problem: Discharge Planning  Goal: Discharge to home or other facility with appropriate resources  Outcome: Progressing     Problem: Pain  Goal: Verbalizes/displays adequate comfort level or baseline comfort level  02/13/2023 0017 by Estanislado Pandy, RN  Outcome: Progressing  02/12/2023 1634 by Idamae Schuller, RN  Outcome: Progressing     Problem: Risk for Elopement  Goal: Patient will not exit the unit/facility without proper excort  02/13/2023 0017 by Estanislado Pandy, RN  Outcome: Progressing  02/12/2023 1634 by Idamae Schuller, RN  Outcome: Progressing     Problem: Safety - Adult  Goal: Free from fall injury  02/13/2023 0017 by Estanislado Pandy, RN  Outcome: Progressing  02/12/2023 1634 by Idamae Schuller, RN  Outcome: Progressing     Problem: Chronic Conditions and Co-morbidities  Goal: Patient's chronic conditions and co-morbidity symptoms are monitored and maintained or improved  02/13/2023 0017 by Estanislado Pandy, RN  Outcome: Progressing  02/12/2023 1634 by Idamae Schuller, RN  Outcome: Progressing     Problem: Skin/Tissue Integrity  Goal: Absence of new skin breakdown  Description: 1.  Monitor for areas of redness and/or skin breakdown  2.  Assess vascular access sites hourly  3.  Every 4-6 hours minimum:  Change oxygen saturation probe site  4.  Every 4-6 hours:  If on nasal continuous positive airway pressure, respiratory therapy assess nares and determine need for appliance change or resting period.  02/13/2023 0017 by Estanislado Pandy, RN  Outcome: Progressing  02/12/2023 1634 by Idamae Schuller, RN  Outcome: Progressing     Problem: Depression  Goal: Will be euthymic at discharge  Description: INTERVENTIONS:  1. Administer medication as ordered  2. Provide emotional support via 1:1 interaction with staff  3. Encourage involvement in milieu/groups/activities  4. Monitor for social isolation  Outcome: Progressing     Problem: Psychosis  Goal: Will report no hallucinations or  delusions  Description: INTERVENTIONS:  1. Administer medication as  ordered  2. Assist with reality testing to support increasing orientation  3. Assess if patient's hallucinations or delusions are encouraging self harm or harm to others and intervene as appropriate  02/13/2023 0017 by Estanislado Pandy, RN  Outcome: Progressing  02/12/2023 1634 by Idamae Schuller, RN  Outcome: Progressing     Problem: Behavior  Goal: Pt/Family maintain appropriate behavior and adhere to behavioral management agreement, if implemented  Description: INTERVENTIONS:  1. Assess patient/family's coping skills and  non-compliant behavior (including use of illegal substances)  2. Notify security of behavior or suspected illegal substances which indicate the need for search of the family and/or belongings  3. Encourage verbalization of thoughts and concerns in a socially appropriate manner  4. Utilize positive, consistent limit setting strategies supporting safety of patient, staff and others  5. Encourage participation in the decision making process about the behavioral management agreement  6. If a visitor's behavior poses a threat to safety call refer to organization policy.  7. Initiate consult with Child psychotherapist, Psychosocial CNS, Spiritual Care as appropriate  Outcome: Progressing     Problem: Sleep Disturbance  Goal: Will exhibit normal sleeping pattern  Description: INTERVENTIONS:  1. Administer medication as ordered  2. Decrease environmental stimuli, including noise, as appropriate  3. Discourage social isolation and naps during the day  Outcome: Progressing     Problem: Involuntary Admit  Goal: Will cooperate with staff recommendations and doctor's orders and will demonstrate appropriate behavior  Description: INTERVENTIONS:  1. Treat underlying conditions and  offer medication as ordered  2. Educate regarding involuntary admission procedures and rules  3. Contain excessive/inappropriate behavior per unit and hospital  policies  Outcome: Progressing     Problem: Self Care Deficit  Goal: Return ADL status to a safe level of function  Description: INTERVENTIONS:  1. Administer medication as ordered  2. Assess ADL deficits and provide assistive devices as needed  3. Obtain PT/OT consults as needed  4. Assist and instruct patient to increase activity and self care as tolerated  Outcome: Progressing     Problem: ABCDS Injury Assessment  Goal: Absence of physical injury  Outcome: Progressing     Problem: Discharge Planning  Goal: Discharge to home or other facility with appropriate resources  Outcome: Progressing     Problem: Pain  Goal: Verbalizes/displays adequate comfort level or baseline comfort level  02/13/2023 0017 by Estanislado Pandy, RN  Outcome: Progressing  02/12/2023 1634 by Idamae Schuller, RN  Outcome: Progressing     Problem: Risk for Elopement  Goal: Patient will not exit the unit/facility without proper excort  02/13/2023 0017 by Estanislado Pandy, RN  Outcome: Progressing  02/12/2023 1634 by Idamae Schuller, RN  Outcome: Progressing     Problem: Safety - Adult  Goal: Free from fall injury  02/13/2023 0017 by Estanislado Pandy, RN  Outcome: Progressing  02/12/2023 1634 by Idamae Schuller, RN  Outcome: Progressing     Problem: Chronic Conditions and Co-morbidities  Goal: Patient's chronic conditions and co-morbidity symptoms are monitored and maintained or improved  02/13/2023 0017 by Estanislado Pandy, RN  Outcome: Progressing  02/12/2023 1634 by Idamae Schuller, RN  Outcome: Progressing     Problem: Skin/Tissue Integrity  Goal: Absence of new skin breakdown  Description: 1.  Monitor for areas of redness and/or skin breakdown  2.  Assess vascular access sites hourly  3.  Every 4-6 hours minimum:  Change oxygen saturation probe site  4.  Every 4-6 hours:  If on nasal continuous positive airway pressure, respiratory therapy assess nares and determine need for appliance change or resting period.  02/13/2023 0017  by Estanislado Pandy, RN  Outcome: Progressing  02/12/2023 1634 by Idamae Schuller, RN  Outcome: Progressing     Problem: Depression  Goal: Will be euthymic at discharge  Description: INTERVENTIONS:  1. Administer medication as ordered  2. Provide emotional support via 1:1 interaction with staff  3. Encourage involvement in milieu/groups/activities  4. Monitor for social isolation  Outcome: Progressing     Problem: Psychosis  Goal: Will report no hallucinations or delusions  Description: INTERVENTIONS:  1. Administer medication as  ordered  2. Assist with reality testing to support increasing orientation  3. Assess if patient's hallucinations or delusions are encouraging self harm or harm to others and intervene as appropriate  02/13/2023 0017 by Estanislado Pandy, RN  Outcome: Progressing  02/12/2023 1634 by Idamae Schuller, RN  Outcome: Progressing     Problem: Behavior  Goal: Pt/Family maintain appropriate behavior and adhere to behavioral management agreement, if implemented  Description: INTERVENTIONS:  1. Assess patient/family's coping skills and  non-compliant behavior (including use of illegal substances)  2. Notify security of behavior or suspected illegal substances which indicate the need for search of the family and/or belongings  3. Encourage verbalization of thoughts and concerns in a socially appropriate manner  4. Utilize positive, consistent limit setting strategies supporting safety of patient, staff and others  5. Encourage participation in the decision making process about the  behavioral management agreement  6. If a visitor's behavior poses a threat to safety call refer to organization policy.  7. Initiate consult with Child psychotherapist, Psychosocial CNS, Spiritual Care as appropriate  Outcome: Progressing     Problem: Sleep Disturbance  Goal: Will exhibit normal sleeping pattern  Description: INTERVENTIONS:  1. Administer medication as ordered  2. Decrease environmental stimuli, including  noise, as appropriate  3. Discourage social isolation and naps during the day  Outcome: Progressing     Problem: Involuntary Admit  Goal: Will cooperate with staff recommendations and doctor's orders and will demonstrate appropriate behavior  Description: INTERVENTIONS:  1. Treat underlying conditions and offer medication as ordered  2. Educate regarding involuntary admission procedures and rules  3. Contain excessive/inappropriate behavior per unit and hospital policies  Outcome: Progressing     Problem: Self Care Deficit  Goal: Return ADL status to a safe level of function  Description: INTERVENTIONS:  1. Administer medication as ordered  2. Assess ADL deficits and provide assistive devices as needed  3. Obtain PT/OT consults as needed  4. Assist and instruct patient to increase activity and self care as tolerated  Outcome: Progressing     Problem: ABCDS Injury Assessment  Goal: Absence of physical injury  Outcome: Progressing

## 2023-02-14 ENCOUNTER — Inpatient Hospital Stay: Admit: 2023-02-14 | Payer: PRIVATE HEALTH INSURANCE

## 2023-02-14 LAB — CBC
Hematocrit: 40.6 % (ref 36.6–50.3)
Hemoglobin: 13.2 g/dL (ref 12.1–17.0)
MCH: 29.3 PG (ref 26.0–34.0)
MCHC: 32.5 g/dL (ref 30.0–36.5)
MCV: 90 FL (ref 80.0–99.0)
MPV: 9.7 FL (ref 8.9–12.9)
Nucleated RBCs: 0 PER 100 WBC
Platelets: 259 10*3/uL (ref 150–400)
RBC: 4.51 M/uL (ref 4.10–5.70)
RDW: 14.4 % (ref 11.5–14.5)
WBC: 4.3 10*3/uL (ref 4.1–11.1)
nRBC: 0 10*3/uL (ref 0.00–0.01)

## 2023-02-14 LAB — COMPREHENSIVE METABOLIC PANEL
ALT: 89 U/L — ABNORMAL HIGH (ref 12–78)
AST: 41 U/L — ABNORMAL HIGH (ref 15–37)
Albumin/Globulin Ratio: 0.8 — ABNORMAL LOW (ref 1.1–2.2)
Albumin: 3.2 g/dL — ABNORMAL LOW (ref 3.5–5.0)
Alk Phosphatase: 72 U/L (ref 45–117)
Anion Gap: 9 mmol/L (ref 5–15)
BUN/Creatinine Ratio: 17 (ref 12–20)
BUN: 17 mg/dL (ref 6–20)
CO2: 27 mmol/L (ref 21–32)
Calcium: 9.4 mg/dL (ref 8.5–10.1)
Chloride: 104 mmol/L (ref 97–108)
Creatinine: 1 mg/dL (ref 0.70–1.30)
Est, Glom Filt Rate: >90 mL/min/{1.73_m2} (ref >60)
Globulin: 3.8 g/dL (ref 2.0–4.0)
Glucose: 102 mg/dL — ABNORMAL HIGH (ref 65–100)
Potassium: 4.1 mmol/L (ref 3.5–5.1)
Sodium: 140 mmol/L (ref 136–145)
Total Bilirubin: 0.3 mg/dL (ref 0.2–1.0)
Total Protein: 7 g/dL (ref 6.4–8.2)

## 2023-02-14 LAB — MAGNESIUM: Magnesium: 2.1 mg/dL (ref 1.6–2.4)

## 2023-02-14 LAB — PHOSPHORUS: Phosphorus: 4.6 mg/dL (ref 2.6–4.7)

## 2023-02-14 MED FILL — CEPHALEXIN 250 MG/5ML PO SUSR: 250 MG/5ML | ORAL | Qty: 10

## 2023-02-14 MED FILL — OLANZAPINE 15 MG PO TBDP: 15 MG | ORAL | Qty: 1

## 2023-02-14 MED FILL — HYDROXYZINE HCL 50 MG PO TABS: 50 MG | ORAL | Qty: 1

## 2023-02-14 NOTE — Behavioral Health Treatment Team (Signed)
BEHAVIORAL HEALTH GROUP NOTE FOR NON ATTENDEES        DATE: 02/14/2023      GROUP START: 1100    GROUP END: 1140          GROUP TYPE: nursing education-medications        ATTENDANCE: Not appropriate for group due to sympton severity and Excused by staff          SIGNATURE:  Idamae Schuller, RN

## 2023-02-14 NOTE — Progress Notes (Incomplete)
Psychiatric Progress Note      Patient: Lucas Hicks MRN: [id number]  SSN: [id number]    Date of Birth: 05/04/1981  Age: 42 y.o.  Sex: male      Admit Date: 02/04/2023       Subjective:     Lucas Hicks patient is drowsy this morning.  Not able to participate in the interview.  Not responding to verbal stimuli but responding to some tactile stimuli.  Patient with some oral intake with assistance.  Continues to be incontinent.  Continues to be dependent on ADLs.    Objective:     Vitals:    02/13/23 1445 02/14/23 0609 02/14/23 1412 02/14/23 1645   BP: 116/67 124/66  122/64   Pulse: 68 71  70   Resp: 16 17  16    Temp: 98.2 F (36.8 C) 98 F (36.7 C)  98 F (36.7 C)   TempSrc: Temporal Temporal  Temporal   SpO2: 95% 98%  98%   Weight:       Height:   1.75 m (5' 8.9")         Mental Status Exam:   Patient is drowsy and was not able to participate in the interview.    MEDICATIONS:  No current facility-administered medications for this encounter.     No current outpatient medications on file.        DISCUSSION:  Discussed risk and benefits of medication, provided an opportunity to answer any questions, reviewed discharge goals.    Lab/Data Review:  No results found for this or any previous visit (from the past 24 hour(s)).            Assessment:     Principal Problem:    Psychosis, unspecified psychosis type (HCC)  Active Problems:    Urine frequency  Resolved Problems:    * No resolved hospital problems. *    Major depressive disorder by history  Generalized anxiety disorder by history  Stimulant/cocaine use disorder severity unspecified  History of opiate use disorder  Rule out sedative-hypnotic use disorder  Rule out delirium        Patient admitted under a TDO for disorganized behaviors.  Likely secondary to substance abuse.    Plan:   Will hold on olanzapine for tonight.  monitor oral intake.  Reviewed labs-within normal limits  Obtain collateral information  Continue with therapy        Signed By: Lajuana Carry MD Psychiatry     Feb 15, 2023

## 2023-02-14 NOTE — Consults (Signed)
Comprehensive Nutrition Assessment    Type and Reason for Visit:  Consult    Nutrition Recommendations/Plan:   Continue Regular diet- Finger Foods  Continue snacks TID  Continue 1:1 fdg assist  Add Ensure Enlive TID (1050kcal, 60gpro)  Consider SLP consult; add MVI, folic acid, vit D  Monitor/document wt, BM, PO+ONS% in I/O     Malnutrition Assessment:  Malnutrition Status:  Mild malnutrition (02/14/23 1411)    Context:  Social/Environmental Circumstances     Findings of the 6 clinical characteristics of malnutrition:  Energy Intake:  50% or less estimated energy requirements for 1 month or longer (50% or less during LOS)  Weight Loss:  Unable to assess     Body Fat Loss:  Unable to assess     Muscle Mass Loss:  Unable to assess    Fluid Accumulation:  No significant fluid accumulation    Grip Strength:  Not Performed    Nutrition Assessment:    Admit for cocaine induced psychosis. Consulted for ONS. UDS +Benzo and cocaine. Per nsg note, wife reported recent d/c (4/13) from [facility] where he was intubated d/t unresponsive 2/2 intentional overdose. S/p I+D (4/28) to R finger for abscess. Per RN and I/O, poor PO intake during admit, LOS 10 days. Avg PO intake 25-50%, obtained via chart review; RN reported pt now requires 1:1 fdg assist 2/2 medical decline. Unable to speak w/ pt d/t AMS; has sitter in place. No wt hx in EMR; no updated wt collect during admit. Labs: BG Alb 3.2, ALT, AST 41, BG 102. Meds: Keflex.    Nutrition Related Findings:    NFPE deferred d/t working remotely. RN reported pt may need SLP consult d/t medical decline. No N/V/D/C. Last BM 5/10. No edema. Wound Type: Surgical Incision       Current Nutrition Intake & Therapies:    Average Meal Intake: 26-50%, 51-75%  Average Supplements Intake: None Ordered  ADULT ORAL NUTRITION SUPPLEMENT; AM Snack, PM Snack, HS Snack; Standard 4 oz Oral Supplement  ADULT DIET; Regular; Finger Food, Safety Tray; Safety Tray (Disposables No  Utensils)    Anthropometric Measures:  Height: 175 cm (5' 8.9")  Ideal Body Weight (IBW): 159 lbs (72 kg)       Current Body Weight: 72.5 kg (159 lb 13.3 oz), 100.5 % IBW. Weight Source: Stated  Current BMI (kg/m2): 23.7        Weight Adjustment For: No Adjustment                 BMI Categories: Normal Weight (BMI 18.5-24.9)    Estimated Daily Nutrient Needs:  Energy Requirements Based On: Kcal/kg  Weight Used for Energy Requirements: Current  Energy (kcal/day): 1813-2175kcals/day (25-30kcal/kg)  Weight Used for Protein Requirements: Current  Protein (g/day): 87-102g/day (1.2-1.4g/kg, 2/2 substance abuse)  Method Used for Fluid Requirements: ml/Kg  Fluid (ml/day): 18109ml/day (3ml/kcal, CHF)    Nutrition Diagnosis:   Inadequate protein-energy intake, In context of social or environmental circumstances related to cognitive or neurological impairment (2/2 cocaine induced psychosis) as evidenced by intake 0-25%, intake 26-50%, intake 51-75%, lab values    Nutrition Interventions:   Food and/or Nutrient Delivery: Continue Current Diet, Snacks (Comment), Start Oral Nutrition Supplement  Nutrition Education/Counseling: No recommendation at this time  Coordination of Nutrition Care: Continue to monitor while inpatient, Speech Therapy, Feeding Assistance/Environment Change, Coordination of Care  Plan of Care discussed with: RN    Goals:     Goals: by next RD assessment, PO intake 50%  or greater       Nutrition Monitoring and Evaluation:   Behavioral-Environmental Outcomes: None Identified  Food/Nutrient Intake Outcomes: Diet Advancement/Tolerance, Food and Nutrient Intake, Supplement Intake  Physical Signs/Symptoms Outcomes: Weight, Meal Time Behavior, Chewing or Swallowing, Biochemical Data    Discharge Planning:    Too soon to determine     Roland Earl  Contact: ext [phone] or via [employer]

## 2023-02-14 NOTE — Behavioral Health Treatment Team (Signed)
TREATMENT TEAM COMPLETED    Attending meeting was as follows:  Patient,  Writer, and Dr. Bernadette Hoit.      Daily Treatment Team consists of the following:     Pt. Cognitive status:   Confused, Oriented to self only, does not follow commands, can only stay alert 10 seconds at a time.  Of note, patient is having apneic episodes.  Sitter at bedside as pt is on 1:1.    Pt. Attending Groups: No - unable to participate    Pt. Participating in groups:  No - unable to participate    Pt. Homicidal / Suicidal: delusions and Minimally responsive Rass -2, -3.    Pt. Behaviors:  Other Not agitated, but when awake he is restless for about 10 minutes, then goes back to sleep.*    Pt. Compliant with Medications:  Yes    New Medication orders:  No      Discharge Plan:  unknown at this time

## 2023-02-14 NOTE — Behavioral Health Treatment Team (Signed)
BEHAVIORAL HEALTH GROUP NOTE FOR NON ATTENDEES        DATE: 02/14/2023      GROUP START: 2000    GROUP END: 2045          GROUP TYPE: Wrap-Up        ATTENDANCE: Not appropriate for group due to sympton severity          SIGNATURE:  Shelby Dubin, CNA    Pt. Is a 1.1

## 2023-02-14 NOTE — Plan of Care (Signed)
Problem: Discharge Planning  Goal: Discharge to home or other facility with appropriate resources  Outcome: Progressing     Problem: Pain  Goal: Verbalizes/displays adequate comfort level or baseline comfort level  Outcome: Progressing     Problem: Risk for Elopement  Goal: Patient will not exit the unit/facility without proper excort  Outcome: Progressing     Problem: Safety - Adult  Goal: Free from fall injury  Outcome: Progressing     Problem: Chronic Conditions and Co-morbidities  Goal: Patient's chronic conditions and co-morbidity symptoms are monitored and maintained or improved  Outcome: Progressing     Problem: Skin/Tissue Integrity  Goal: Absence of new skin breakdown  Description: 1.  Monitor for areas of redness and/or skin breakdown  2.  Assess vascular access sites hourly  3.  Every 4-6 hours minimum:  Change oxygen saturation probe site  4.  Every 4-6 hours:  If on nasal continuous positive airway pressure, respiratory therapy assess nares and determine need for appliance change or resting period.  Outcome: Progressing     Problem: Depression  Goal: Will be euthymic at discharge  Description: INTERVENTIONS:  1. Administer medication as ordered  2. Provide emotional support via 1:1 interaction with staff  3. Encourage involvement in milieu/groups/activities  4. Monitor for social isolation  Outcome: Progressing     Problem: Psychosis  Goal: Will report no hallucinations or delusions  Description: INTERVENTIONS:  1. Administer medication as  ordered  2. Assist with reality testing to support increasing orientation  3. Assess if patient's hallucinations or delusions are encouraging self harm or harm to others and intervene as appropriate  Outcome: Progressing     Problem: Behavior  Goal: Pt/Family maintain appropriate behavior and adhere to behavioral management agreement, if implemented  Description: INTERVENTIONS:  1. Assess patient/family's coping skills and  non-compliant behavior (including use of  illegal substances)  2. Notify security of behavior or suspected illegal substances which indicate the need for search of the family and/or belongings  3. Encourage verbalization of thoughts and concerns in a socially appropriate manner  4. Utilize positive, consistent limit setting strategies supporting safety of patient, staff and others  5. Encourage participation in the decision making process about the behavioral management agreement  6. If a visitor's behavior poses a threat to safety call refer to organization policy.  7. Initiate consult with Child psychotherapist, Psychosocial CNS, Spiritual Care as appropriate  Outcome: Progressing     Problem: Drug Abuse/Detox  Goal: Will have no detox symptoms and will verbalize plan for changing drug-related behavior  Description: INTERVENTIONS:  1. Administer medication as ordered  2. Monitor physical status  3. Provide emotional support with 1:1 interaction with staff  4. Encourage  recovery focused treatment   Outcome: Progressing     Problem: Sleep Disturbance  Goal: Will exhibit normal sleeping pattern  Description: INTERVENTIONS:  1. Administer medication as ordered  2. Decrease environmental stimuli, including noise, as appropriate  3. Discourage social isolation and naps during the day  Outcome: Progressing     Problem: Involuntary Admit  Goal: Will cooperate with staff recommendations and doctor's orders and will demonstrate appropriate behavior  Description: INTERVENTIONS:  1. Treat underlying conditions and offer medication as ordered  2. Educate regarding involuntary admission procedures and rules  3. Contain excessive/inappropriate behavior per unit and hospital policies  Outcome: Progressing     Problem: Self Care Deficit  Goal: Return ADL status to a safe level of function  Description: INTERVENTIONS:  1. Administer  medication as ordered  2. Assess ADL deficits and provide assistive devices as needed  3. Obtain PT/OT consults as needed  4. Assist and instruct  patient to increase activity and self care as tolerated  Outcome: Progressing     Problem: ABCDS Injury Assessment  Goal: Absence of physical injury  Outcome: Progressing

## 2023-02-14 NOTE — Behavioral Health Treatment Team (Signed)
Received patient in the bed with sitter at bedside breathing shallow with apneic episodes up to 10 seconds in duration.  RASS -3, movement but no eye contact to vocal stimuli.  When patient is awakened, he moves restlessly for about 5-10 seconds and falls asleep.  VS - BP 107/70, HR 84.  PERRLA.  Trachea midline.  Lungs clear but diminished BLL.  Heart RRR, no murmur auscultated.  Abdomen soft, flat, nontender to light palpation.  Patient remains incontinent of bowel and bladder, brief intact.  Scrotal erythema, but skin is warm, dry and intact minus the right index finger surgical I&D wound.  Wound is wrapped with dressing and dressing is clean, dry, and intact.  Patient when awake occasionally bites the finger.  Radial pulses +2 and equal, DP pulses +1 and equal.  Extremities warm and move well.

## 2023-02-14 NOTE — Behavioral Health Treatment Team (Signed)
Pt lying in bed after being fed, currently incontinent of bowel and bladder, with nonpurposeful restless movements of all extremities.  Appears to more or less be picking at his blanket and sheet.  At some point patient has pulled the dressing off of his right index finger, which has since been replaced.

## 2023-02-14 NOTE — Behavioral Health Treatment Team (Signed)
RASS at this time is 0.  Patient is still eating his lunch with assistance by sitter at bedside.

## 2023-02-14 NOTE — Plan of Care (Signed)
Problem: Pain  Goal: Verbalizes/displays adequate comfort level or baseline comfort level  02/14/2023 0909 by Fortino Sic, RN  Outcome: Progressing  02/13/2023 2243 by Estanislado Pandy, RN  Outcome: Progressing     Problem: Safety - Adult  Goal: Free from fall injury  02/14/2023 0909 by Fortino Sic, RN  Outcome: Progressing  02/13/2023 2243 by Estanislado Pandy, RN  Outcome: Progressing     Problem: Chronic Conditions and Co-morbidities  Goal: Patient's chronic conditions and co-morbidity symptoms are monitored and maintained or improved  02/14/2023 0909 by Fortino Sic, RN  Outcome: Progressing  02/13/2023 2243 by Estanislado Pandy, RN  Outcome: Progressing     Problem: Skin/Tissue Integrity  Goal: Absence of new skin breakdown  Description: 1.  Monitor for areas of redness and/or skin breakdown  2.  Assess vascular access sites hourly  3.  Every 4-6 hours minimum:  Change oxygen saturation probe site  4.  Every 4-6 hours:  If on nasal continuous positive airway pressure, respiratory therapy assess nares and determine need for appliance change or resting period.  02/14/2023 0909 by Fortino Sic, RN  Outcome: Progressing  02/13/2023 2243 by Estanislado Pandy, RN  Outcome: Progressing     Problem: Psychosis  Goal: Will report no hallucinations or delusions  Description: INTERVENTIONS:  1. Administer medication as  ordered  2. Assist with reality testing to support increasing orientation  3. Assess if patient's hallucinations or delusions are encouraging self harm or harm to others and intervene as appropriate  02/14/2023 0909 by Fortino Sic, RN  Outcome: Progressing  02/13/2023 2243 by Estanislado Pandy, RN  Outcome: Progressing     Problem: Self Care Deficit  Goal: Return ADL status to a safe level of function  Description: INTERVENTIONS:  1. Administer medication as ordered  2. Assess ADL deficits and provide assistive devices as needed  3. Obtain PT/OT consults as  needed  4. Assist and instruct patient to increase activity and self care as tolerated  02/14/2023 0909 by Fortino Sic, RN  Outcome: Progressing  02/13/2023 2243 by Estanislado Pandy, RN  Outcome: Progressing     Problem: ABCDS Injury Assessment  Goal: Absence of physical injury  02/14/2023 0909 by Fortino Sic, RN  Outcome: Progressing  02/13/2023 2243 by Estanislado Pandy, RN  Outcome: Progressing

## 2023-02-14 NOTE — Behavioral Health Treatment Team (Incomplete)
B:   Patient alert and oriented x 1.   Pt. States depression is ***.  Pt. States Anxiety is ***.  Pt. {Actions; denies/admits to:5300} Hallucinations.  Pt. {Actions; denies/admits to:5300} Delusions.  Pt. {Actions; denies/admits to:5300} SI.  Pt. {Actions; denies/admits to:5300} HI.   Pt. {COOPERATIVE/UNCOOPERATIVE:36823} with Assessment.  Pt.'s behavior { BEHAVIOR:19866}.       I:    If patient is disoriented, reorient pt.  Build trust with patient, by therapeutic listening and Groups.  Encourage pt. To attend and Participate in Groups.  Provide Medications as ordered and needed.  Encourage pt. To be up for all meals and snacks, and consume all of each. Encourage pt. To interact with staff and peers in a positive manner.  Encourage pt. To keep good hygiene.  Q 15 minute safety checks.    R:   Pt. {Desc; did/not:14019} attend and Participate in Group.  Pt. Is Compliant with Medications   {BH Medication Compliant:41293}.   Pt. {ACTIONS; IS/NOT:19962} getting up for meals and snacks.  Pt. Consumes {Numbers; 25-100%:10057} of Meals.  Pt. {XB:14782}, interacting with Peers.   Pt.'s hygiene is {Good/Fair/Poor:41158}.  Pt. {DOES/DOES NFA:21308}, have any safety issues.    P:   Pt. Will develop and continue to utilize positive Coping skills.  Pt. Will continue to comply with Plan of Care toward Discharge.  Pt. Will continue to stay safe on the unit.

## 2023-02-15 ENCOUNTER — Inpatient Hospital Stay
Admit: 2023-02-15 | Discharge: 2023-02-23 | Disposition: A | Discharge status: Hospice | Payer: MEDICAID | Attending: Internal Medicine | Admitting: Internal Medicine

## 2023-02-15 DIAGNOSIS — R4182 Altered mental status, unspecified: Secondary | ICD-10-CM

## 2023-02-15 DIAGNOSIS — G928 Other toxic encephalopathy: Secondary | ICD-10-CM

## 2023-02-15 MED ORDER — OLANZAPINE 10 MG PO TBDP
10 | Freq: Every evening | ORAL | Status: DC
Start: 2023-02-15 — End: 2023-02-15

## 2023-02-15 MED ORDER — OLANZAPINE 10 MG PO TBDP
10 | ORAL | Status: AC
Start: 2023-02-15 — End: 2023-02-14
  Administered 2023-02-15: 03:00:00 10 via ORAL

## 2023-02-15 MED FILL — CEPHALEXIN 250 MG/5ML PO SUSR: 250 MG/5ML | ORAL | Qty: 10

## 2023-02-15 MED FILL — OLANZAPINE 10 MG PO TBDP: 10 MG | ORAL | Qty: 1

## 2023-02-15 MED FILL — HYDROXYZINE HCL 50 MG PO TABS: 50 MG | ORAL | Qty: 1

## 2023-02-15 NOTE — Behavioral Health Treatment Team (Signed)
Notified Dr. Tonie Griffith, Tele-Neuro, who saw the patient yesterday, that there is no note visible in the EMR.  Gave him the MRN, and he will alert Operations that the note is in but we can't see it on our end.

## 2023-02-15 NOTE — Plan of Care (Signed)
Problem: Discharge Planning  Goal: Discharge to home or other facility with appropriate resources  02/15/2023 2113 by Tanna Furry, RN  Outcome: Progressing  02/15/2023 1933 by Joseph Art, RN  Outcome: Progressing     Problem: Pain  Goal: Verbalizes/displays adequate comfort level or baseline comfort level  02/15/2023 2113 by Tanna Furry, RN  Outcome: Progressing  Flowsheets (Taken 02/15/2023 2045)  Verbalizes/displays adequate comfort level or baseline comfort level: Encourage patient to monitor pain and request assistance  02/15/2023 1933 by Joseph Art, RN  Outcome: Progressing     Problem: Skin/Tissue Integrity  Goal: Absence of new skin breakdown  Description: 1.  Monitor for areas of redness and/or skin breakdown  2.  Assess vascular access sites hourly  3.  Every 4-6 hours minimum:  Change oxygen saturation probe site  4.  Every 4-6 hours:  If on nasal continuous positive airway pressure, respiratory therapy assess nares and determine need for appliance change or resting period.  02/15/2023 2113 by Tanna Furry, RN  Outcome: Progressing  02/15/2023 1933 by Joseph Art, RN  Outcome: Progressing     Problem: Safety - Adult  Goal: Free from fall injury  02/15/2023 2113 by Tanna Furry, RN  Outcome: Progressing  02/15/2023 1933 by Joseph Art, RN  Outcome: Progressing

## 2023-02-15 NOTE — Behavioral Health Treatment Team (Signed)
Per Dr. Bernadette Hoit, initiated transfer request.  Called transfer center [phone] and spoke with Byrd Hesselbach.  Pt's wife prefers [facility] in [place] or [facility] in [place].  Provided pt information and Dr. Virgia Land contact information.  Faxed facesheet to Casimiro Needle at [place].  Waiting for call back.

## 2023-02-15 NOTE — Behavioral Health Treatment Team (Signed)
TREATMENT TEAM COMPLETED    Attending meeting was as follows:  Patient, Writer, and Dr. Bernadette Hoit.  Meeting was conducted via telehealth.        Daily Treatment Team consists of the following:     Pt. Cognitive status:   Sleeping, but arousable to vocal stimuli.  RASS -1.    Pt. Attending Groups: No    Pt. Participating in groups:  unable to participate in group sessions at this time due to altered mental status, disorientation.    Pt. Homicidal / Suicidal: unable to assess    Pt. Behaviors:  Cooperative but unable to follow commands consistently.    Pt. Compliant with Medications:  Yes    New Medication orders:  No    Discharge Plan:    Tele-Neuro physician saw patient via telehealth yesterday and recommended EEG and MRI.  Per Dr. Bernadette Hoit, start process to transfer patient to an acute facility with Neurology services.  Pt's wife requested facility in [place] such as [facility] in [place], [place], [facility], or [facility], [place].

## 2023-02-15 NOTE — Discharge Summary (Incomplete)
PSYCHIATRIC DISCHARGE SUMMARY         IDENTIFICATION:    Patient Name  Montray Boccuzzi   Date of Birth Nov 23, 1980   CSN [id number]   Medical Record Number  [id number]      Age  42 y.o.   PCP No primary care provider on file.   Admit date:  02/04/2023    Discharge date: 02/15/2023   Room Number  105/01  @ [facility]   Date of Service  02/15/2023            TYPE OF DISCHARGE: REGULAR               CONDITION AT DISCHARGE: {condition:30010686}       PROVISIONAL & DISCHARGE DIAGNOSES:    Admission Diagnoses:   Depression [F32.A]  Psychosis, unspecified psychosis type (HCC) [F29]    Discharge Diagnoses:             CC & HISTORY OF PRESENT ILLNESS:  The patient, Gordon Trisler, is a 42 y.o.  White (non-Hispanic) male with a past psychiatric history significant for major depressive disorder, generalized anxiety disorder, stimulant/cocaine use disorder, opioid use disorder and alcohol use disorder admitted to [facility] under TDO for bizarre behavior/psychosis.  Patient is drowsy and was not able to participate in the interview.  Most of the information is per chart review.     Per chart review patient was brought to [facility] by EMS after he was found in the middle of the street taking his clothes off.  Patient's drug screen is also positive for cocaine and benzos.       HOSPITALIZATION COURSE:    Ezrah Karney was admitted to the inpatient psychiatric unit [facility] for acute psychiatric stabilization in regards to symptomatology as described in the HPI above. Patient admitted under a TDO for disorganized behaviors.  Patient was alert and oriented at the time of admission.  From day to patient with waxing and waning of mental status.  Patient was dependent on ADLs.  He is incontinent.  Patient also required assistance with feeding.  He had a head CT at the time of admission which was within normal limits.  He was continued on his  antibiotics and the medication olanzapine.  He was not restarted on Lamictal as he was not taking it prior to coming to the hospital based on the information provided by his wife.  Also based on the collateral information from patient's wife he is high functioning at baseline and had relapse on drugs recently.  Patient was also placed on one-to-one observation due to concern for safety as he was placed on a hospital bed.  On 02/14/2023 consulted neuro as patient did not have any improvement with his mental status.  Recommended for MRI of brain and EEG.  Patient was transferred to [facility] for further management.          LABS AND IMAGAING:    Labs Reviewed   COMPREHENSIVE METABOLIC PANEL - Abnormal; Notable for the following components:       Result Value    AST 79 (*)     ALT 183 (*)     Albumin 3.2 (*)     Globulin 4.1 (*)     Albumin/Globulin Ratio 0.8 (*)     All other components within normal limits   URINALYSIS WITH REFLEX TO CULTURE - Abnormal; Notable for the following components:    Specific Gravity,  UA >1.030 (*)     Protein, UA Trace (*)     Ketones, Urine 40 (*)     BACTERIA, URINE 1+ (*)     Calcium Oxalate 3+ (*)     All other components within normal limits   URINE DRUG SCREEN - Abnormal; Notable for the following components:    Benzodiazepines, Urine Positive (*)     Cocaine, Urine Positive (*)     All other components within normal limits   VITAMIN B12 & FOLATE - Abnormal; Notable for the following components:    Vitamin B-12 1387 (*)     Folate 23.2 (*)     All other components within normal limits   BILIRUBIN, CONFIRMATORY - Abnormal; Notable for the following components:    Specific Gravity, UA >1.030 (*)     All other components within normal limits   CBC - Abnormal; Notable for the following components:    RDW 14.6 (*)     All other components within normal limits   COMPREHENSIVE METABOLIC PANEL - Abnormal; Notable for the following components:    Glucose 106 (*)     AST 50 (*)      ALT 149 (*)     Albumin 3.3 (*)     Albumin/Globulin Ratio 0.8 (*)     All other components within normal limits   CBC - Abnormal; Notable for the following components:    RDW 14.6 (*)     All other components within normal limits   COMPREHENSIVE METABOLIC PANEL - Abnormal; Notable for the following components:    Glucose 108 (*)     Bun/Cre Ratio 11 (*)     AST 49 (*)     ALT 103 (*)     Albumin 3.3 (*)     Albumin/Globulin Ratio 0.8 (*)     All other components within normal limits   CBC - Abnormal; Notable for the following components:    RDW 14.6 (*)     All other components within normal limits   COMPREHENSIVE METABOLIC PANEL - Abnormal; Notable for the following components:    Glucose 114 (*)     Bun/Cre Ratio 11 (*)     AST 44 (*)     ALT 105 (*)     Albumin 3.3 (*)     Globulin 4.1 (*)     Albumin/Globulin Ratio 0.8 (*)     All other components within normal limits   COMPREHENSIVE METABOLIC PANEL - Abnormal; Notable for the following components:    Glucose 116 (*)     ALT 92 (*)     Albumin 3.2 (*)     Albumin/Globulin Ratio 0.8 (*)     All other components within normal limits   COMPREHENSIVE METABOLIC PANEL - Abnormal; Notable for the following components:    Glucose 102 (*)     AST 41 (*)     ALT 89 (*)     Albumin 3.2 (*)     Albumin/Globulin Ratio 0.8 (*)     All other components within normal limits   CBC WITH AUTO DIFFERENTIAL   AMMONIA   CK   MYOGLOBIN, BLOOD   LACTIC ACID   HEMOGLOBIN A1C   LIPID PANEL   MAGNESIUM   TSH   TROPONIN   CBC   CBC   MAGNESIUM   PHOSPHORUS     No results found for: "PHEN", "PHENO", "PHENY", "PTN", "VALAC", "VALP", "CARB2"  No results displayed because visit has over  200 results.        XR CHEST PORTABLE    Result Date: 02/14/2023  EXAM: XR CHEST PORTABLE INDICATION: apneic episodes COMPARISON: None FINDINGS: A portable AP radiograph of the chest was obtained at 1036 hours. . The lungs are clear. The cardiac and mediastinal contours and pulmonary vascularity are normal.   The bones and soft tissues are grossly within normal limits.     No acute cardiopulmonary process.    CT HEAD WO CONTRAST    Result Date: 02/06/2023  HEAD CT WITHOUT CONTRAST: 02/06/2023 1:58 PM INDICATION: Altered mental status, worsening sedation, COMPARISON: None. PROCEDURE: Axial images of the head were obtained without contrast. Coronal and sagittal reformats were performed. CT dose reduction was achieved through use of a standardized protocol tailored for this examination and automatic exposure control for dose modulation.  FINDINGS: No loss of gray-white differentiation to suggest late acute or early subacute infarction. The ventricles and sulci are appropriate in size and configuration for age. No mass effect or intracranial hemorrhage.     No acute intracranial abnormality.                  DISPOSITION:    Acute care facility               FOLLOW-UP CARE:    Activity as tolerated  Regular diet             PROGNOSIS:   Guarded ---- based on nature of patient's pathology/ies and treatment compliance issues.  Prognosis is greatly dependent upon patient's ability follow discharge recommendations, take medications as described, and follow up with scheduled appointments.             DISCHARGE MEDICATIONS:     Informed consent given for the use of following psychotropic medications:     Medication List        CONTINUE taking these medications      alfuzosin 10 MG extended release tablet  Commonly known as: UROXATRAL     OLANZapine zydis 10 MG disintegrating tablet  Commonly known as: ZYPREXA     Ventolin HFA 108 (90 Base) MCG/ACT inhaler  Generic drug: albuterol sulfate HFA            STOP taking these medications      lamoTRIgine 200 MG tablet  Commonly known as: LAMICTAL                     A coordinated, multidisplinary treatment team meeting was conducted with Floyce Stakes at [facility]. This team consists of the nurse and social worker.         Signed:  Lajuana Carry MD  Psychiatry

## 2023-02-15 NOTE — Behavioral Health Treatment Team (Incomplete)
B:   Currently, patient alert and oriented to person, restless RASS +1, unable to follow commands consistently.  When asked if he has 5 kids, pt nods yes.  When asked if he has 3 wives, pt nods no.  When asked if he is married to [place], he nods yes.  All appropriate answers.  Pt. States depression is ***.  Pt. States Anxiety is ***.  Pt. {Actions; denies/admits to:5300} Hallucinations.  Pt. {Actions; denies/admits to:5300} Delusions.  Pt. {Actions; denies/admits to:5300} SI.  Pt. {Actions; denies/admits to:5300} HI.   Pt. {COOPERATIVE/UNCOOPERATIVE:36823} with Assessment.  Pt.'s behavior { BEHAVIOR:19866}.       I:    If patient is disoriented, reorient pt.  Build trust with patient, by therapeutic listening and Groups.  Encourage pt. To attend and Participate in Groups.  Provide Medications as ordered and needed.  Encourage pt. To be up for all meals and snacks, and consume all of each. Encourage pt. To interact with staff and peers in a positive manner.  Encourage pt. To keep good hygiene.  Q 15 minute safety checks.    R:   Pt. {Desc; did/not:14019} attend and Participate in Group.  Pt. Is Compliant with Medications   {BH Medication Compliant:41293}.   Pt. {ACTIONS; IS/NOT:19962} getting up for meals and snacks.  Pt. Consumes {Numbers; 25-100%:10057} of Meals.  Pt. {ZO:10960}, interacting with Peers.   Pt.'s hygiene is {Good/Fair/Poor:41158}.  Pt. {DOES/DOES AVW:09811}, have any safety issues.    P:   Pt. Will develop and continue to utilize positive Coping skills.  Pt. Will continue to comply with Plan of Care toward Discharge.  Pt. Will continue to stay safe on the unit.

## 2023-02-15 NOTE — Behavioral Health Treatment Team (Signed)
Received call from pt's wife stating she would like to visit at 10 am Monday.  States she will get a hotel room locally and will stay the previous night.  She would like the patient transferred to a facility in [place]; she is made aware that writer does not know if that is possible due to the patient is under TDO.  TDO explanation was provided to pt's wife, and she verbalized understanding.  Waiting for a call from Dr. Bernadette Hoit.

## 2023-02-15 NOTE — Behavioral Health Treatment Team (Signed)
Per [facility], patient cannot be accepted in [place].  Next option is to check [facility] facilities nearby for an available Intermediate Neuro bed.    Called back. Per Byrd Hesselbach, no beds available at [facility].

## 2023-02-15 NOTE — Progress Notes (Addendum)
Received pt from [facility]. Pt restless in bed. Minimal eye contact. Will respond to name and some yes or no questions. Unable to assess orientation due to limited participation. Unable to assess neurological status.    Called wife Leia Alf, stated that pt choice medication crack cocaine. Waymon Amato takes oxys, fentanyl, benzos, if is Rx benzos he will take all the pills at one time. Has been taking drugs since he was 10. States that in early April pt overdosed first week of April and was intubated. After ICU stay, he showed desire to want to get clean, went to a "halfway house" and ended up leaving in 3 hours and went back on the streets. Stated he has very bad psychosis since then, stated that he has been hallucinating telling her "they're coming to get me. Keep talking with me on the phone, The cops are going to get me."

## 2023-02-15 NOTE — Behavioral Health Treatment Team (Signed)
Received patient lying in bed restless and attempting to sit up in bed.  Repositioned patient in chair position in the bed, and patient went to sleep almost immediately.      Received call from pt's wife, Anastasia Dehoog, asking for an update and requesting a call from attending physician, Dr. Bernadette Hoit.  Leia Alf would like to talk with the patient when he is more awake this afternoon.      Also asked about transferring him to a medical floor or another facility with neurology and MRI capability.  Will notify Dr. Bernadette Hoit about these requests and have recommended that [name] call back after 3 to possibly talk to the patient.

## 2023-02-15 NOTE — Progress Notes (Signed)
Psychiatric Progress Note      Patient: Lucas Hicks MRN: [id number]  SSN: [id number]    Date of Birth: 1981-08-05  Age: 42 y.o.  Sex: male      Admit Date: 02/04/2023       Subjective:     Lucas Hicks patient continues to be drowsy.  Not responding to verbal stimuli.  Responding to tactile stimuli but not able to participate in the interview.  Mostly drowsy in the daytime and alert in the evening time.  Patient with poor sleep last night.  Even when he was alert he was not able to participate in the interview.  Patient is also anxious and restless sometimes.  Continues to have urinary incontinence.  Continues to require assistance with ADLs.  Some improvement with oral intake.    Objective:     Vitals:    02/13/23 1445 02/14/23 0609 02/14/23 1412 02/14/23 1645   BP: 116/67 124/66  122/64   Pulse: 68 71  70   Resp: 16 17  16    Temp: 98.2 F (36.8 C) 98 F (36.7 C)  98 F (36.7 C)   TempSrc: Temporal Temporal  Temporal   SpO2: 95% 98%  98%   Weight:       Height:   1.75 m (5' 8.9")         Mental Status Exam:   Patient is drowsy and was not able to participate in the interview.    MEDICATIONS:  No current facility-administered medications for this encounter.     No current outpatient medications on file.        DISCUSSION:  Discussed risk and benefits of medication, provided an opportunity to answer any questions, reviewed discharge goals.    Lab/Data Review:  No results found for this or any previous visit (from the past 24 hour(s)).            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  Patient was alert and oriented at the time of admission.  From day to patient with waxing and waning of mental status.  Patient was dependent on ADLs.  He is incontinent.  Patient also required assistance with feeding.  He had a head CT at the time of admission which was within normal limits.  He was continued on his antibiotics and the medication olanzapine.  He was not restarted on Lamictal as he was not taking it prior to coming to the hospital based on the information provided by his wife.  Also based on the collateral information from patient's wife he is high functioning at baseline and had relapse on drugs recently.  Patient was also placed on one-to-one observation due to concern for safety as he was placed on a hospital bed.  On 02/14/2023 consulted neuro as patient did not have any improvement with his mental status.  Recommended for MRI of brain and EEG.  Patient was transferred to [facility] for  further management.    Plan:     Consulted neurology-thank you for recommendations.  Plan for MRI and EEG.  Plan for transfer to [facility] for further management of acute encephalopathy.      Signed By: Lajuana Carry MD Psychiatry     Feb 15, 2023

## 2023-02-15 NOTE — Plan of Care (Signed)
Problem: Discharge Planning  Goal: Discharge to home or other facility with appropriate resources  02/15/2023 1319 by Fortino Sic, RN  Outcome: Progressing  02/15/2023 0009 by Cranford Mon, RN  Outcome: Progressing     Problem: Pain  Goal: Verbalizes/displays adequate comfort level or baseline comfort level  02/15/2023 1319 by Fortino Sic, RN  Outcome: Progressing  02/15/2023 0009 by Cranford Mon, RN  Outcome: Progressing     Problem: Safety - Adult  Goal: Free from fall injury  02/15/2023 1319 by Fortino Sic, RN  Outcome: Progressing  02/15/2023 0009 by Cranford Mon, RN  Outcome: Progressing     Problem: Chronic Conditions and Co-morbidities  Goal: Patient's chronic conditions and co-morbidity symptoms are monitored and maintained or improved  02/15/2023 1319 by Fortino Sic, RN  Outcome: Progressing  02/15/2023 0009 by Cranford Mon, RN  Outcome: Progressing     Problem: Skin/Tissue Integrity  Goal: Absence of new skin breakdown  Description: 1.  Monitor for areas of redness and/or skin breakdown  2.  Assess vascular access sites hourly  3.  Every 4-6 hours minimum:  Change oxygen saturation probe site  4.  Every 4-6 hours:  If on nasal continuous positive airway pressure, respiratory therapy assess nares and determine need for appliance change or resting period.  02/15/2023 1319 by Fortino Sic, RN  Outcome: Progressing  02/15/2023 0009 by Cranford Mon, RN  Outcome: Progressing     Problem: Depression  Goal: Will be euthymic at discharge  Description: INTERVENTIONS:  1. Administer medication as ordered  2. Provide emotional support via 1:1 interaction with staff  3. Encourage involvement in milieu/groups/activities  4. Monitor for social isolation  02/15/2023 1319 by Fortino Sic, RN  Outcome: Progressing  02/15/2023 0009 by Cranford Mon, RN  Outcome: Progressing     Problem: Psychosis  Goal: Will report no hallucinations or delusions  Description: INTERVENTIONS:  1.  Administer medication as  ordered  2. Assist with reality testing to support increasing orientation  3. Assess if patient's hallucinations or delusions are encouraging self harm or harm to others and intervene as appropriate  02/15/2023 1319 by Fortino Sic, RN  Outcome: Progressing  02/15/2023 0009 by Cranford Mon, RN  Outcome: Progressing

## 2023-02-15 NOTE — Plan of Care (Signed)
Problem: Discharge Planning  Goal: Discharge to home or other facility with appropriate resources  Outcome: Progressing     Problem: Pain  Goal: Verbalizes/displays adequate comfort level or baseline comfort level  Outcome: Progressing     Problem: Skin/Tissue Integrity  Goal: Absence of new skin breakdown  Description: 1.  Monitor for areas of redness and/or skin breakdown  2.  Assess vascular access sites hourly  3.  Every 4-6 hours minimum:  Change oxygen saturation probe site  4.  Every 4-6 hours:  If on nasal continuous positive airway pressure, respiratory therapy assess nares and determine need for appliance change or resting period.  Outcome: Progressing     Problem: Safety - Adult  Goal: Free from fall injury  Outcome: Progressing

## 2023-02-16 DIAGNOSIS — R4182 Altered mental status, unspecified: Secondary | ICD-10-CM

## 2023-02-16 LAB — COMPREHENSIVE METABOLIC PANEL W/ REFLEX TO MG FOR LOW K
ALT: 99 U/L — ABNORMAL HIGH (ref 12–78)
AST: 47 U/L — ABNORMAL HIGH (ref 15–37)
Albumin/Globulin Ratio: 0.8 — ABNORMAL LOW (ref 1.1–2.2)
Albumin: 3.6 g/dL (ref 3.5–5.0)
Alk Phosphatase: 84 U/L (ref 45–117)
Anion Gap: 4 mmol/L — ABNORMAL LOW (ref 5–15)
BUN/Creatinine Ratio: 22 — ABNORMAL HIGH (ref 12–20)
BUN: 19 MG/DL (ref 6–20)
CO2: 29 mmol/L (ref 21–32)
Calcium: 10 MG/DL (ref 8.5–10.1)
Chloride: 105 mmol/L (ref 97–108)
Creatinine: 0.86 MG/DL (ref 0.70–1.30)
Est, Glom Filt Rate: >90 mL/min/{1.73_m2} (ref >60)
Globulin: 4.3 g/dL — ABNORMAL HIGH (ref 2.0–4.0)
Glucose: 118 mg/dL — ABNORMAL HIGH (ref 65–100)
Potassium: 4.8 mmol/L (ref 3.5–5.1)
Sodium: 138 mmol/L (ref 136–145)
Total Bilirubin: 0.4 MG/DL (ref 0.2–1.0)
Total Protein: 7.9 g/dL (ref 6.4–8.2)

## 2023-02-16 LAB — CBC WITH AUTO DIFFERENTIAL
Basophils %: 0 % (ref 0–1)
Basophils Absolute: 0 10*3/uL (ref 0.0–0.1)
Eosinophils %: 0 % (ref 0–7)
Eosinophils Absolute: 0 10*3/uL (ref 0.0–0.4)
Hematocrit: 43.3 % (ref 36.6–50.3)
Hemoglobin: 13.7 g/dL (ref 12.1–17.0)
Immature Granulocytes %: 0 % (ref 0.0–0.5)
Immature Granulocytes Absolute: 0 10*3/uL (ref 0.00–0.04)
Lymphocytes %: 11 % — ABNORMAL LOW (ref 12–49)
Lymphocytes Absolute: 0.8 10*3/uL (ref 0.8–3.5)
MCH: 28.6 PG (ref 26.0–34.0)
MCHC: 31.6 g/dL (ref 30.0–36.5)
MCV: 90.4 FL (ref 80.0–99.0)
MPV: 9.9 FL (ref 8.9–12.9)
Monocytes %: 5 % (ref 5–13)
Monocytes Absolute: 0.4 10*3/uL (ref 0.0–1.0)
Neutrophils %: 84 % — ABNORMAL HIGH (ref 32–75)
Neutrophils Absolute: 6.5 10*3/uL (ref 1.8–8.0)
Nucleated RBCs: 0 PER 100 WBC
Platelets: 264 10*3/uL (ref 150–400)
RBC: 4.79 M/uL (ref 4.10–5.70)
RDW: 13.8 % (ref 11.5–14.5)
WBC: 7.8 10*3/uL (ref 4.1–11.1)
nRBC: 0 10*3/uL (ref 0.00–0.01)

## 2023-02-16 LAB — VITAMIN B12 & FOLATE
Folate: 16.7 ng/mL (ref 5.0–21.0)
Vitamin B-12: 1235 pg/mL — ABNORMAL HIGH (ref 193–986)

## 2023-02-16 LAB — C-REACTIVE PROTEIN: CRP: <0.29 mg/dL (ref 0.00–0.30)

## 2023-02-16 LAB — TSH: TSH, 3rd Generation: 0.67 u[IU]/mL (ref 0.36–3.74)

## 2023-02-16 LAB — CK: Total CK: 117 U/L (ref 39–308)

## 2023-02-16 LAB — SEDIMENTATION RATE: Sed Rate, Automated: 18 mm/hr — ABNORMAL HIGH (ref 0–15)

## 2023-02-16 LAB — HIV 1/2 AG/AB, 4TH GENERATION,W RFLX CONFIRM: HIV 1/2 Interp: NONREACTIVE

## 2023-02-16 MED ORDER — NORMAL SALINE FLUSH 0.9 % IV SOLN
0.9 | Freq: Two times a day (BID) | INTRAVENOUS | Status: DC
Start: 2023-02-16 — End: 2023-02-23
  Administered 2023-02-17 – 2023-02-23 (×9): 10 mL via INTRAVENOUS

## 2023-02-16 MED ORDER — POLYETHYLENE GLYCOL 3350 17 G PO PACK
17 | Freq: Every day | ORAL | Status: DC | PRN
Start: 2023-02-16 — End: 2023-02-23

## 2023-02-16 MED ORDER — POTASSIUM BICARB-CITRIC ACID 20 MEQ PO TBEF
20 MEQ | ORAL | Status: DC | PRN
Start: 2023-02-16 — End: 2023-02-23

## 2023-02-16 MED ORDER — POTASSIUM CHLORIDE ER 10 MEQ PO TBCR
10 | ORAL | Status: DC | PRN
Start: 2023-02-16 — End: 2023-02-23

## 2023-02-16 MED ORDER — ONDANSETRON 4 MG PO TBDP
4 | Freq: Three times a day (TID) | ORAL | Status: DC | PRN
Start: 2023-02-16 — End: 2023-02-23

## 2023-02-16 MED ORDER — BUPROPION HCL ER (XL) 150 MG PO TB24
150 | Freq: Every day | ORAL | Status: DC
Start: 2023-02-16 — End: 2023-02-23
  Administered 2023-02-16 – 2023-02-23 (×7): 150 mg via ORAL

## 2023-02-16 MED ORDER — NORMAL SALINE FLUSH 0.9 % IV SOLN
0.9 | INTRAVENOUS | Status: DC | PRN
Start: 2023-02-16 — End: 2023-02-23

## 2023-02-16 MED ORDER — HYDROXYZINE HCL 25 MG PO TABS
25 | Freq: Three times a day (TID) | ORAL | Status: DC | PRN
Start: 2023-02-16 — End: 2023-02-23

## 2023-02-16 MED ORDER — ONDANSETRON HCL 4 MG/2ML IJ SOLN
4 | Freq: Four times a day (QID) | INTRAMUSCULAR | Status: DC | PRN
Start: 2023-02-16 — End: 2023-02-23

## 2023-02-16 MED ORDER — ENOXAPARIN SODIUM 40 MG/0.4ML IJ SOSY
40 | Freq: Every day | INTRAMUSCULAR | Status: DC
Start: 2023-02-16 — End: 2023-02-23
  Administered 2023-02-16 – 2023-02-23 (×7): 40 mg via SUBCUTANEOUS

## 2023-02-16 MED ORDER — POTASSIUM CHLORIDE 10 MEQ/100ML IV SOLN
10 | INTRAVENOUS | Status: DC | PRN
Start: 2023-02-16 — End: 2023-02-23

## 2023-02-16 MED ORDER — ACETAMINOPHEN 325 MG PO TABS
325 | Freq: Four times a day (QID) | ORAL | Status: DC | PRN
Start: 2023-02-16 — End: 2023-02-23
  Administered 2023-02-18 – 2023-02-19 (×2): 650 mg via ORAL

## 2023-02-16 MED ORDER — MULTIPLE VITAMINS PO TABS
Freq: Every day | ORAL | Status: DC
Start: 2023-02-16 — End: 2023-02-23
  Administered 2023-02-16 – 2023-02-23 (×6): 1 via ORAL

## 2023-02-16 MED ORDER — MAGNESIUM SULFATE 2000 MG/50 ML IVPB PREMIX
2 | INTRAVENOUS | Status: DC | PRN
Start: 2023-02-16 — End: 2023-02-23

## 2023-02-16 MED ORDER — ACETAMINOPHEN 650 MG RE SUPP
650 | Freq: Four times a day (QID) | RECTAL | Status: DC | PRN
Start: 2023-02-16 — End: 2023-02-23

## 2023-02-16 MED ORDER — SODIUM CHLORIDE 0.9 % IV SOLN
0.9 | INTRAVENOUS | Status: DC | PRN
Start: 2023-02-16 — End: 2023-02-23

## 2023-02-16 MED ORDER — LACTATED RINGERS IV BOLUS
Freq: Once | INTRAVENOUS | Status: AC
Start: 2023-02-16 — End: 2023-02-16
  Administered 2023-02-16: 06:00:00 1000 mL via INTRAVENOUS

## 2023-02-16 MED FILL — THERA PO TABS: ORAL | Qty: 1

## 2023-02-16 MED FILL — NORMAL SALINE FLUSH 0.9 % IV SOLN: 0.9 % | INTRAVENOUS | Qty: 40

## 2023-02-16 MED FILL — BUPROPION HCL ER (XL) 150 MG PO TB24: 150 MG | ORAL | Qty: 1

## 2023-02-16 MED FILL — LACTATED RINGERS IV SOLN: INTRAVENOUS | Qty: 1000

## 2023-02-16 MED FILL — ENOXAPARIN SODIUM 40 MG/0.4ML IJ SOSY: 40 MG/0.4ML | INTRAMUSCULAR | Qty: 0.4

## 2023-02-16 NOTE — Plan of Care (Signed)
Problem: Discharge Planning  Goal: Discharge to home or other facility with appropriate resources  02/16/2023 2208 by Frederich Chick, RN  Outcome: Progressing     Problem: Pain  Goal: Verbalizes/displays adequate comfort level or baseline comfort level  02/16/2023 2208 by Frederich Chick, RN  Outcome: Progressing     Problem: Skin/Tissue Integrity  Goal: Absence of new skin breakdown  Description: 1.  Monitor for areas of redness and/or skin breakdown  2.  Assess vascular access sites hourly  3.  Every 4-6 hours minimum:  Change oxygen saturation probe site  4.  Every 4-6 hours:  If on nasal continuous positive airway pressure, respiratory therapy assess nares and determine need for appliance change or resting period.  02/16/2023 2208 by Frederich Chick, RN  Outcome: Progressing     Problem: Safety - Adult  Goal: Free from fall injury  02/16/2023 2208 by Frederich Chick, RN  Outcome: Progressing     Problem: Chronic Conditions and Co-morbidities  Goal: Patient's chronic conditions and co-morbidity symptoms are monitored and maintained or improved  02/16/2023 2208 by Frederich Chick, RN  Outcome: Progressing

## 2023-02-16 NOTE — Plan of Care (Signed)
Problem: Discharge Planning  Goal: Discharge to home or other facility with appropriate resources  Outcome: Progressing     Problem: Pain  Goal: Verbalizes/displays adequate comfort level or baseline comfort level  Outcome: Progressing     Problem: Skin/Tissue Integrity  Goal: Absence of new skin breakdown  Description: 1.  Monitor for areas of redness and/or skin breakdown  2.  Assess vascular access sites hourly  3.  Every 4-6 hours minimum:  Change oxygen saturation probe site  4.  Every 4-6 hours:  If on nasal continuous positive airway pressure, respiratory therapy assess nares and determine need for appliance change or resting period.  Outcome: Progressing     Problem: Safety - Adult  Goal: Free from fall injury  Outcome: Progressing     Problem: Chronic Conditions and Co-morbidities  Goal: Patient's chronic conditions and co-morbidity symptoms are monitored and maintained or improved  Outcome: Progressing

## 2023-02-16 NOTE — Progress Notes (Signed)
End of Shift Note    Bedside shift change report given to Jessica,RN (oncoming nurse) by Tanna Furry, RN (offgoing nurse).  Report included the following information MAR and Cardiac Rhythm NSR    Shift worked: 7p-7a     Shift summary and any significant changes:     Patient able to answer most answers appropriately. No acute distress noted. Bolus given per order. Bed alarm on. Call bell within reach. Will continue to monitor.       Concerns for physician to address:       Zone phone for oncoming shift:          Activity:     Number times ambulated in hallways past shift: 0  Number of times OOB to chair past shift: 0    Cardiac:   Cardiac Monitoring: Yes           Access:  Current line(s): PIV     Genitourinary:   Urinary status: incontinent    Respiratory:      Chronic home O2 use?: NO  Incentive spirometer at bedside: NO       GI:     Current diet:  ADULT DIET; Regular  Passing flatus: YES  Tolerating current diet: NO       Pain Management:   Patient states pain is manageable on current regimen: YES    Skin:     Interventions: PT/OT consult    Patient Safety:  Fall Score:    Interventions: bed/chair alarm       Length of Stay:  Expected LOS: 3  Actual LOS: 1      Tanna Furry, RN

## 2023-02-16 NOTE — Care Coordination-Inpatient (Signed)
Care Management Initial Assessment       RUR: 12%  Readmission? Yes   1st IM letter given? No  1st [employer] letter given: No    CM reviewed chart and noted readmission.  Pt was transferred to [facility] for a medical workup from an Inpatient Behavioral Health Unit.  Pt was TDO'ed on May 1st for psychosis after being found in the middle of the street taking his clothes off, his tox screen positive for cocaine and benzos.  Pt has a history of heroin and crack cocaine use.  Per his wife, he was hospitalized in April and expressed wanting to clean up so he was discharged to a halfway house, but left there after 3 hours.    Currently pt is not following commands or answering questions appropriately. CM will continue to follow for discharge needs.  Possibly Drug/ETOH resources vs return to behavioral health.     02/16/23 1317   Service Assessment   Patient Orientation Unable to Assess   Cognition Alert   History Provided By Significant Other   Primary Caregiver Self   Support Systems Spouse/Significant Other   Patient's Healthcare Decision Maker is: Legal Next of Kin   PCP Verified by CM No   Prior Functional Level Independent in ADLs/IADLs   Current Functional Level Assistance with the following:   Can patient return to prior living arrangement Unknown at present   Ability to make needs known: Poor   Family able to assist with home care needs: Yes   Would you like for me to discuss the discharge plan with any other family members/significant others, and if so, who? Yes

## 2023-02-16 NOTE — Consults (Addendum)
Neurology Consult Note     NAME: Lucas Hicks   DOB:  1981/02/10   MRN:  [id number]   DATE:  02/16/2023    Assessment and Plan:     42 yo M h/o CHF, HTN, polysubstance abuse (heroin and crack cocaine), cocaine induced paranoia and hallucinations, recent schizophrenia diagnosis, prior intentional overdose presenting with AMS. 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 Per chart review, patient noted to have progressive decline in responsiveness and also noted to have decline in functionality including walking. BH notes also mentioned difficulty with extending limbs and "rigidity". CTH 5/2 showed no acute findings. Labs have been so far unrevealing. Transferred to [facility] for neurologic evaluation. Exam notable for minimal verbal output, somnolence, and increased tone in all four extremities. Etiology unclear but notable spasticity in extremities suggestive of organic lesion in the CNS; however, differential still includes catatonia/other psychiatric disorders. Low suspicion for seizures as cause of symptoms.    - MRI brain, C, T spine w/wo  - Routine EEG  - Follow up nutritional labs  - Psychiatry consulted; appreciate assistance    Subjective   HPI:  [name] is a 42 y.o. male who presents with AMS (altered mental status). Neurology was consulted for AMS. History obtained per primary team and chart review. Patient was not able to provide any history.    [name] presents due to AMS. In early April, treated for drug overdose and PNA requiring intubation. Discharged 4/13 to halfway house but did not stay and continued using drugs on the street. On 4/25, presented to ED for R finger abscess. Reportedly having paranoia and hallucinations during that admission attributed to cocaine use, prompting  Zyprexa prescription. Discharged 4/28. Admitted to behavioral health on 5/1 after being found in the middle of the street taking off his clothes. Reportedly A&Ox3 and walking with steady gait at that time but noted to be "confused", though same day notes document that he was responding to internal stimuli and A&Ox1 only. Noted to be incontinent of urine. Mild transaminitis, ALT predominant. Mental status continued to deteriorate. Patient noted to have "rigid" extremities on 5/4, unable to sit up or stand on his own. Cognition and appetite seemed to be improving mildly  over next few days then worsened again 5/7, progressively more sleepy, alert but responding minimally to verbal stimuli. Progressively less responsive but still eating, drinking, and taking medications with nursing assistance. Notably on 5/10 pm, patient was reportedly "very talkative" and "using upper torso to dancing with staff." Tele-neuro reportedly evaluated patient on 5/11 and recommended MRI and EEG. Yesterday, patient reportedly "able to answer most answer appropriately". Arrived at [facility] on 5/12 am.     Pertinent workup: CTH on 5/2 that showed no acute findings, CBC, CMP within normal limits, ESR 18, CRP negative.    ROS:  A comprehensive ROS was negative except as noted in the HPI above.    PMH:  Past Medical History:   Diagnosis Date    Abscess     Right  index finger, per records pt undergone   incision & drainage  and was discharged home on 28th with prescription for Ceftin, Per [facility] ED rn verball report pton Ceftin 500mg  BID    Asthma     CHF (congestive heart failure) (HCC)     Chronic depression     Scar     surgical scar on Right knee area    Schizophrenia (HCC)  per prescreen pt has hx of schizophrenia diagnosis    Tattoos     tattoos distributed on upper back, Right nec, left head behind ear, left forarm, Right forarm, Right arm circular tattoe       MEDS:  HOME MEDS:  Prior to Admission Medications   Prescriptions  Last Dose Informant Patient Reported? Taking?   OLANZapine zydis (ZYPREXA) 10 MG disintegrating tablet  Outside Pharmacy/PCP Yes No   Sig: Take 1 tablet by mouth nightly   albuterol sulfate HFA (VENTOLIN HFA) 108 (90 Base) MCG/ACT inhaler   Yes No   Sig: Inhale 2 puffs into the lungs every 6 hours as needed for Wheezing   alfuzosin (UROXATRAL) 10 MG extended release tablet  Outside Pharmacy/PCP Yes No   Sig: Take 1 tablet by mouth daily   lamoTRIgine (LAMICTAL) 200 MG tablet  Outside Pharmacy/PCP Yes No   Sig: Take 1 tablet by mouth at bedtime      Facility-Administered Medications: None         CURRENT MEDS:    Current Facility-Administered Medications:     sodium chloride flush 0.9 % injection 5-40 mL, 5-40 mL, IntraVENous, 2 times per day, Ngwafang, Bleck B, MD    sodium chloride flush 0.9 % injection 5-40 mL, 5-40 mL, IntraVENous, PRN, Ngwafang, Bleck B, MD    0.9 % sodium chloride infusion, , IntraVENous, PRN, Ngwafang, Bleck B, MD    potassium chloride (KLOR-CON) extended release tablet 40 mEq, 40 mEq, Oral, PRN **OR** potassium bicarb-citric acid (EFFER-K) effervescent tablet 40 mEq, 40 mEq, Oral, PRN **OR** potassium chloride 10 mEq/100 mL IVPB (Peripheral Line), 10 mEq, IntraVENous, PRN, Ngwafang, Bleck B, MD    magnesium sulfate 2000 mg in 50 mL IVPB premix, 2,000 mg, IntraVENous, PRN, Ngwafang, Bleck B, MD    enoxaparin (LOVENOX) injection 40 mg, 40 mg, SubCUTAneous, Daily, Ngwafang, Bleck B, MD, 40 mg at 02/16/23 0900    ondansetron (ZOFRAN-ODT) disintegrating tablet 4 mg, 4 mg, Oral, Q8H PRN **OR** ondansetron (ZOFRAN) injection 4 mg, 4 mg, IntraVENous, Q6H PRN, Ngwafang, Bleck B, MD    polyethylene glycol (GLYCOLAX) packet 17 g, 17 g, Oral, Daily PRN, Ngwafang, Bleck B, MD    acetaminophen (TYLENOL) tablet 650 mg, 650 mg, Oral, Q6H PRN **OR** acetaminophen (TYLENOL) suppository 650 mg, 650 mg, Rectal, Q6H PRN, Ngwafang, Bleck B, MD      Allergies   Allergen Reactions    Amoxicillin     Fish Allergy  Hives    Penicillin G Hives    Prednisone Other (See Comments)     irritable         SH:  Social History     Socioeconomic History    Marital status: Single     Spouse name: Not on file    Number of children: Not on file    Years of education: Not on file    Highest education level: Not on file   Occupational History    Not on file   Tobacco Use    Smoking status: Never    Smokeless tobacco: Never    Tobacco comments:     Pt non smoker   Vaping Use    Vaping Use: Unknown   Substance and Sexual Activity    Alcohol use: Never    Drug use: Yes     Types: Cocaine     Comment: pt denies drug use but wa spostive for Benzo and cocaine in his drug screen at ED    Sexual  activity: Yes     Partners: Female   Other Topics Concern    Not on file   Social History Narrative    Not on file     Social Determinants of Health     Financial Resource Strain: Not on file   Food Insecurity: Patient Unable To Answer (02/05/2023)    Hunger Vital Sign     Worried About Running Out of Food in the Last Year: Patient unable to answer     Ran Out of Food in the Last Year: Patient unable to answer   Transportation Needs: Patient Unable To Answer (02/05/2023)    PRAPARE - Therapist, art (Medical): Patient unable to answer     Lack of Transportation (Non-Medical): Patient unable to answer   Physical Activity: Not on file   Stress: Not on file   Social Connections: Not on file   Intimate Partner Violence: Not on file   Housing Stability: Patient Unable To Answer (02/05/2023)    Housing Stability Vital Sign     Unable to Pay for Housing in the Last Year: Patient unable to answer     Number of Places Lived in the Last Year: 0     Unstable Housing in the Last Year: Patient unable to answer           FH:  Family History   Family history unknown: Yes           Objective:     BP 127/85   Pulse 64   Temp 98.2 F (36.8 C) (Oral)   Resp 15   Wt 68.3 kg (150 lb 9.2 oz)   SpO2 99%   BMI 22.30 kg/m   Temp (24hrs), Avg:98.5 F (36.9  C), Min:97.5 F (36.4 C), Max:99.1 F (37.3 C)      Physical Exam:  General: Ill appearing patient in no apparent distress.   Pulmonary: No increased WOB  Cardiac: Regular rate and rhythm  Extremities: No cyanosis or edema    Neurological Exam:  Mental Status: Resting with eyes closed, but eyes open to voice and will briefly regard before falling asleep again if not continuously stimulated, did not respond to orientation questions but shook head no to some simple questions, such as "are you in pain?". Raised R hand to command, otherwise did not follow any commands. When asked if he was able to relax, patient verbalized "yes, I'm able to relax." Otherwise minimal intelligible verbal output, grunts with most MSK stimulation.   Cranial Nerves:   Resisted pupillary reflex testing, closing eyes shut and turning away vigorously. EOM grossly intact. No appreciable significant facial droop.    Motor:  Positioned with arms, legs, and hips flexed. Frequently fidgeting in all extremities. Lifted RUE antigravity to command once, grossly at least 3/5 strength elsewhere, grossly symmetric. Notable spasticity in all four extremities.   Reflexes:   DTRs 2+ RUE, 1+ LUE, 1+ BLE patellars and absent achilles, but limited by positioning. Toes appear to be mute but patient repeatedly withdrawing with testing, limiting evaluation. No clonus.   Sensory:   Grossly intact   Gait:     Cerebellar:  UTA         STUDIES AND REPORTS:  Recent Results (from the past 24 hour(s))   Comprehensive Metabolic Panel w/ Reflex to MG    Collection Time: 02/16/23  2:14 AM   Result Value Ref Range    Sodium 138 136 - 145 mmol/L  Potassium 4.8 3.5 - 5.1 mmol/L    Chloride 105 97 - 108 mmol/L    CO2 29 21 - 32 mmol/L    Anion Gap 4 (L) 5 - 15 mmol/L    Glucose 118 (H) 65 - 100 mg/dL    BUN 19 6 - 20 MG/DL    Creatinine 9.81 1.91 - 1.30 MG/DL    Bun/Cre Ratio 22 (H) 12 - 20      Est, Glom Filt Rate >90 >60 ml/min/1.14m2    Calcium 10.0 8.5 - 10.1 MG/DL     Total Bilirubin 0.4 0.2 - 1.0 MG/DL    ALT 99 (H) 12 - 78 U/L    AST 47 (H) 15 - 37 U/L    Alk Phosphatase 84 45 - 117 U/L    Total Protein 7.9 6.4 - 8.2 g/dL    Albumin 3.6 3.5 - 5.0 g/dL    Globulin 4.3 (H) 2.0 - 4.0 g/dL    Albumin/Globulin Ratio 0.8 (L) 1.1 - 2.2     CBC with Auto Differential    Collection Time: 02/16/23  2:14 AM   Result Value Ref Range    WBC 7.8 4.1 - 11.1 K/uL    RBC 4.79 4.10 - 5.70 M/uL    Hemoglobin 13.7 12.1 - 17.0 g/dL    Hematocrit 47.8 29.5 - 50.3 %    MCV 90.4 80.0 - 99.0 FL    MCH 28.6 26.0 - 34.0 PG    MCHC 31.6 30.0 - 36.5 g/dL    RDW 62.1 30.8 - 65.7 %    Platelets 264 150 - 400 K/uL    MPV 9.9 8.9 - 12.9 FL    Nucleated RBCs 0.0 0 PER 100 WBC    nRBC 0.00 0.00 - 0.01 K/uL    Neutrophils % 84 (H) 32 - 75 %    Lymphocytes % 11 (L) 12 - 49 %    Monocytes % 5 5 - 13 %    Eosinophils % 0 0 - 7 %    Basophils % 0 0 - 1 %    Immature Granulocytes % 0 0.0 - 0.5 %    Neutrophils Absolute 6.5 1.8 - 8.0 K/UL    Lymphocytes Absolute 0.8 0.8 - 3.5 K/UL    Monocytes Absolute 0.4 0.0 - 1.0 K/UL    Eosinophils Absolute 0.0 0.0 - 0.4 K/UL    Basophils Absolute 0.0 0.0 - 0.1 K/UL    Immature Granulocytes Absolute 0.0 0.00 - 0.04 K/UL    Differential Type AUTOMATED     Culture, Blood 1    Collection Time: 02/16/23  2:14 AM    Specimen: Blood   Result Value Ref Range    Special Requests RIGHT  Antecubital        Culture NO GROWTH <24 HRS     Culture, Blood 2    Collection Time: 02/16/23  2:14 AM    Specimen: Blood   Result Value Ref Range    Special Requests LEFT  Antecubital        Culture NO GROWTH <24 HRS     HIV 1/2 Ag/Ab, 4TH Generation,W Rflx Confirm    Collection Time: 02/16/23  2:14 AM   Result Value Ref Range    HIV 1/2 Interp NONREACTIVE NR      HIV 1/2 Result Comment SEE NOTE     Sedimentation Rate    Collection Time: 02/16/23  2:14 AM   Result Value Ref Range    Sed  Rate, Automated 18 (H) 0 - 15 mm/hr   C-Reactive Protein    Collection Time: 02/16/23  2:14 AM   Result Value Ref Range     CRP <0.29 0.00 - 0.30 mg/dL       IMAGING: Personally reviewed.         Signed:Aqueelah Cotrell Robynn Pane, MD

## 2023-02-16 NOTE — Progress Notes (Signed)
[name]                                                                                          Hospitalist Progress Note  Rogue Bussing, MD  Office Phone: [phone]        Date of Service:  02/16/2023  NAME:  Lucas Hicks  DOB:  26-May-1981  MRN:  [id number]       Admission Summary:   42 y.o man with a history of CHF, HTN, polysubstance abuse including heroin and cocaine, ?schizophrenia, who was admitted to an outside behavioral health unit on May 1 for bizarre behavior and paranoia. He was transferred here for medical work-up due to lack of improvement at [facility].       Interval history / Subjective:   He is a poor historian and unable to provide any history.     Assessment & Plan:     AMS/psychosis  -etiology remains unclear  -awaiting MRI brain, added C/T spine per neuro recs  -EEG  -neurology and psychiatry consultation    R index finger cellulitis w/ abscess:  -complete outpt abx    Polysubstance abuse    Chronic HCV          Code status: full  Prophylaxis: sc Lovenox  Care Plan discussed with: rn/neurology    Anticipated Disposition:   Inpatient  Cardiac monitoring: Telemetry  Central Line:            Social Determinants of Health     Tobacco Use: Low Risk  (02/06/2023)    Patient History     Smoking Tobacco Use: Never     Smokeless Tobacco Use: Never     Passive Exposure: Not on file   Alcohol Use: Not At Risk (02/05/2023)    AUDIT-C     Frequency of Alcohol Consumption: Never     Average Number of Drinks: Patient does not drink     Frequency of Binge Drinking: Never   Financial Resource Strain: Not on file   Food Insecurity: Patient Unable To Answer (02/05/2023)    Hunger Vital Sign     Worried About Running Out of Food in the Last Year: Patient unable to answer     Ran Out of Food in the Last Year: Patient unable to answer   Transportation Needs: Patient Unable To Answer (02/05/2023)    PRAPARE - Transportation     Lack of Transportation (Medical): Patient unable to  answer     Lack of Transportation (Non-Medical): Patient unable to answer   Physical Activity: Not on file   Stress: Not on file   Social Connections: Not on file   Intimate Partner Violence: Not on file   Depression: Not at risk (02/05/2023)    PHQ-2     PHQ-2 Score: 0   Housing Stability: Patient Unable To Answer (02/05/2023)    Housing Stability Vital Sign     Unable to Pay for Housing in the Last Year: Patient unable to answer     Number of Places Lived in the Last Year: 0     Unstable Housing  in the Last Year: Patient unable to answer   Interpersonal Safety: Patient Unable To Answer (02/05/2023)    Interpersonal Safety Domain Source: IP Abuse Screening     Physical abuse: Unable to assess     Verbal abuse: Unable to assess     Emotional abuse: Unable to assess      Financial abuse: Unable to assess      Sexual abuse: Unable to assess    Utilities: Patient Unable To Answer (02/05/2023)    [facility] Utilities     Threatened with loss of utilities: Patient unable to answer       Review of Systems:   Review of systems not obtained due to patient factors.       Vital Signs:    Last 24hrs VS reviewed since prior progress note. Most recent are:  Vitals:    02/16/23 1400   BP:    Pulse: 60   Resp:    Temp:    SpO2:          Intake/Output Summary (Last 24 hours) at 02/16/2023 1638  Last data filed at 02/16/2023 0430  Gross per 24 hour   Intake 1002.32 ml   Output --   Net 1002.32 ml        Physical Examination:     I had a face to face encounter with this patient and independently examined them on 02/16/2023 as outlined below:          General : NAD, confused  HEENT: PEERL, EOMI, moist mucus membrane  Neck: supple, no JVD, no meningeal signs  Chest: Clear to auscultation bilaterally   CVS: S1 S2 heard, Capillary refill less than 2 seconds  Abd: soft/ non tender, non distended, BS physiological,   Ext: no clubbing, no cyanosis, no edema  Neuro/Psych: grossly non-focal, spastic extremities, follows some simple commands  Skin: warm      Data Review:    Review and/or order of clinical lab test  Review and/or order of tests in the radiology section of CPT  Review and/or order of tests in the medicine section of CPT      I have personally and independently reviewed all pertinent labs, diagnostic studies, imaging, and have provided independent interpretation of the same.     Labs:     Recent Labs     02/14/23  0726 02/16/23  0214   WBC 4.3 7.8   HGB 13.2 13.7   HCT 40.6 43.3   PLT 259 264     Recent Labs     02/14/23  0726 02/16/23  0214   NA 140 138   K 4.1 4.8   CL 104 105   CO2 27 29   BUN 17 19   MG 2.1  --    PHOS 4.6  --      Recent Labs     02/14/23  0726 02/16/23  0214   ALT 89* 99*   GLOB 3.8 4.3*     No results for input(s): "INR", "APTT" in the last 72 hours.    Invalid input(s): "PTP"   No results for input(s): "TIBC", "FERR" in the last 72 hours.    Invalid input(s): "FE", "PSAT"   No results found for: "RBCF"   No results for input(s): "PH", "PCO2", "PO2" in the last 72 hours.  No results for input(s): "CPK" in the last 72 hours.    Invalid input(s): "CPKMB", "CKNDX", "TROIQ"  Lab Results   Component Value Date/Time  CHOL 143 02/06/2023 11:40 AM    HDL 38 02/06/2023 11:40 AM    LDL 88.4 02/06/2023 11:40 AM     No results found for: "GLUCPOC"  @LABUA @    Notes reviewed from all clinical/nonclinical/nursing services involved in patient's clinical care. Care coordination discussions were held with appropriate clinical/nonclinical/ nursing providers based on care coordination needs.         Patients current active Medications were reviewed, considered, added and adjusted based on the clinical condition today.      Home Medications were reconciled to the best of my ability given all available resources at the time of admission. Route is PO if not otherwise noted.      Admission Status:[id number]:::1}      Medications Reviewed:     Current Facility-Administered Medications   Medication Dose Route Frequency    sodium chloride flush 0.9 %  injection 5-40 mL  5-40 mL IntraVENous 2 times per day    sodium chloride flush 0.9 % injection 5-40 mL  5-40 mL IntraVENous PRN    0.9 % sodium chloride infusion   IntraVENous PRN    potassium chloride (KLOR-CON) extended release tablet 40 mEq  40 mEq Oral PRN    Or    potassium bicarb-citric acid (EFFER-K) effervescent tablet 40 mEq  40 mEq Oral PRN    Or    potassium chloride 10 mEq/100 mL IVPB (Peripheral Line)  10 mEq IntraVENous PRN    magnesium sulfate 2000 mg in 50 mL IVPB premix  2,000 mg IntraVENous PRN    enoxaparin (LOVENOX) injection 40 mg  40 mg SubCUTAneous Daily    ondansetron (ZOFRAN-ODT) disintegrating tablet 4 mg  4 mg Oral Q8H PRN    Or    ondansetron (ZOFRAN) injection 4 mg  4 mg IntraVENous Q6H PRN    polyethylene glycol (GLYCOLAX) packet 17 g  17 g Oral Daily PRN    acetaminophen (TYLENOL) tablet 650 mg  650 mg Oral Q6H PRN    Or    acetaminophen (TYLENOL) suppository 650 mg  650 mg Rectal Q6H PRN    multivitamin 1 tablet  1 tablet Oral Daily    buPROPion (WELLBUTRIN XL) extended release tablet 150 mg  150 mg Oral Daily    hydrOXYzine HCl (ATARAX) tablet 50 mg  50 mg Oral TID PRN     ______________________________________________________________________  EXPECTED LENGTH OF STAY: 3  ACTUAL LENGTH OF STAY:          1                 Rogue Bussing, MD

## 2023-02-16 NOTE — Consults (Signed)
[name]  PSYCHIATRY CONSULT NOTE:    Name: Lucas Hicks  MR#: [id number]  DOB: August 22, 1981  ACCOUNT#: [id number]  ADMIT DATE: 02/15/2023    REASON FOR CONSULT: AMS/psychosis    Information obtained from: Chart, Wife, RN    HISTORY OF PRESENTING COMPLAINT:  Lucas Hicks is a 42 y.o. male with PMH of polysubstance abuse, (heroin and cocaine) CHF, hypertension, ?schizophrenia dx questioned,  He is currently seen on neuroscience telemetry while sitting all on his bed .  He is mostly appropriate with me today he is easily distracted but able to be reoriented.  Denies any thoughts of suicide, homicide, he does have some audio or visual hallucinations from time to time did not denies any now presently.  He is unsure if they are command in nature or just sounds at this time cannot describe.  Recently had been admitted to behavioral health unit and decompensated physically brought here for treatment. Denies any new psychiatric symptoms. He has a bizarre affect and sitting is odd fashion across the bed when I entered and easily distracted. Other than these present symptoms, no new psychiatric symptoms reported.     PAST PSYCHIATRIC HISTORY: Schizophrenia?  Major depressive disorder    SUBSTANCE ABUSE HISTORY: Heroin and cocaine addiction    PSYCHOSOCIAL HISTORY: Lives with wife who is supportive    MENTAL STATUS EXAM:   Lucas Hicks is a 42 y.o. White (non-Hispanic) male who appears his stated age. He is largely cooperative with assessment questions. He makes fair eye contact.  No psychomotor agitation/retardation is observed. His speech is slow in rate, low tone, and volume. His self-reported mood is "OK". His affect is blunt. He denies auditory and visual hallucinations. No paranoia or delusions are elicited with assessment but informed by RN he has delusional thinking or fears about police officers. His thought processes are disorganized. He denies suicidal and homicidal ideation. [name] X 3. His  recent and remote memory is impaired Insight and judgment are limited/poor.    DIAGNOSTIC IMPRESSION: Polysubstance abuse disorder, ?  Underlying schizophrenia?. Delirium secondary to either medical or mental/withdrawal underlying origin      ASSESSMENT/PLAN:     Continue with altered mental status/delirium prophylaxis care to include reorientation ensuring he has proper nutrition and hydration and other daily needs.    Make sure all medical acute ailments are addressed and treated.    I am evaluating medications and will likely start on Wellbutrin and then Lamictal which have worked well together previously (Wellbutrin was stopped in prison due to cost)    Hydroxyzine as needed for anxiety    Psychiatry will gladly follow during his stay for any recommended medication management    Thank you for the opportunity to participate in the care of your patient. Please re-consult psychiatry as needed.

## 2023-02-16 NOTE — Progress Notes (Signed)
Physical Therapy:  02/16/23    Order received and acknowledged. OT spoke with RN, pt currently not following commands and not appropriate for PT eval. Will defer for now and continue to follow.     Thank you,  Curly Rim, PT

## 2023-02-16 NOTE — H&P (Addendum)
History & Physical    Primary Care Provider: No primary care provider on file.  Source of Information: Patient AND CHART REVIEW    History of Presenting Illness:   Lucas Hicks is a 42 y.o. male with documented past medical history of CHF, hypertension, polysubstance use including heroin and cocaine substance-induced mood disorder who was transferred to [facility] for neurological evaluation of altered mental status.  Chart review shows patient was recently admitted to the hospital from April 26 table 28 for right index finger infection with abscess formation.  He underwent incision and drainage with intraoperative cultures reportedly showing group A strep.  He was treated with IV antibiotics and subsequently discharged on cefadroxil 500mg  oral 2 tabs daily x 7 days .  He was noted to have delusional tendencies during this admission and was started on Zyprexa 10 mg with reduction of his Lamictal dose to 200 mg.    He returned to emergency room in [place] on May 1 for concerns of bizarre behavior and paranoia.  Chart review shows he reportedly noted that he was completely off Lamictal and had gone several days without any sleep.  He was subsequently admitted to behavioral health unit for management of acute psychosis.  He spent several days in behavioral health unit without much improvement.  Per chart review, decision was made for neurology consultation to transfer him to [facility] for MRI, EEG and further workup.    Further detailed chart review shows that on "4/5 he was found unresponsive, apneic with pinpoint pupils in hotel room was given Narcan and soon after noted that he became combative and unable to be redirected so was intubated for airway protection.  Was treated for pneumonia as chest x-ray showing bilateral patchy airspace disease.  Other complications at that time included acute renal failure with BUN/creatinine of 37/3.86 and marked elevated LFTs with AST of 2316 and ALT of  2154 which later resolved.    Patient is seen and examined at bedside upon transfer.  Somnolent arouses to voice and will only occasionally speak and answer questions.  Unable to provide more detailed history or review of systems.     Review of Systems:  Unobtainable due to patient factors    Past Medical History:   Diagnosis Date    Abscess     Right  index finger, per records pt undergone   incision & drainage  and was discharged home on 28th with prescription for Ceftin, Per [facility] ED rn verball report pton Ceftin 500mg  BID    Asthma     CHF (congestive heart failure) (HCC)     Chronic depression     Scar     surgical scar on Right knee area    Schizophrenia (HCC)     per prescreen pt has hx of schizophrenia diagnosis    Tattoos     tattoos distributed on upper back, Right nec, left head behind ear, left forarm, Right forarm, Right arm circular tattoe      Past Surgical History:   Procedure Laterality Date    APPENDECTOMY      as noted on CT done in 2022     Prior to Admission medications    Medication Sig Start Date End Date Taking? Authorizing Provider   OLANZapine zydis (ZYPREXA) 10 MG disintegrating tablet Take 1 tablet by mouth nightly    [provider]   alfuzosin (UROXATRAL) 10 MG extended release tablet Take 1 tablet by mouth daily  [provider]   albuterol sulfate HFA (VENTOLIN HFA) 108 (90 Base) MCG/ACT inhaler Inhale 2 puffs into the lungs every 6 hours as needed for Wheezing    [provider]     Allergies   Allergen Reactions    Amoxicillin     Fish Allergy Hives    Penicillin G Hives    Prednisone Other (See Comments)     irritable      Family History   Family history unknown: Yes        SOCIAL HISTORY:  Patient resides:  Independently X   Assisted Living    SNF    With family care       Smoking history:   None X   Former    Chronic      Alcohol history:   None X   Social    Chronic      Ambulates:   Independently X   w/cane    w/walker    w/wc    CODE  STATUS:  DNR    Full X   Other      Objective:     Physical Exam:     BP 129/84   Pulse 77   Temp 97.5 F (36.4 C) (Oral)   Resp 20   Wt 68.3 kg (150 lb 9.2 oz)   SpO2 97%   BMI 22.30 kg/m         General:  Alert, cooperative, no distress, appears stated age.   Head:  Normocephalic, without obvious abnormality, atraumatic.   Eyes:  Conjunctivae/corneas clear. PERRL, EOMs intact.   Nose: Nares normal. Septum midline. Mucosa normal.        Neck: Supple, symmetrical, trachea midline.       Lungs:   Clear to auscultation bilaterally.   Chest wall:  No tenderness or deformity.   Heart:  Regular rate and rhythm, S1, S2 normal   Abdomen:   Soft, non-tender. Bowel sounds normal. No masses,  No organomegaly.   Extremities: Extremities normal, atraumatic, no cyanosis or edema.  Right index finger pain Band-Aid   Pulses: 2+ and symmetric all extremities.   Skin: Skin color, texture, turgor normal. No rashes or lesions   Neurologic: CNII-XII grossly intact.        Data Review:     Recent Days:  Recent Labs     02/13/23  0720 02/14/23  0726   WBC 5.1 4.3   HGB 13.2 13.2   HCT 40.3 40.6   PLT 256 259     Recent Labs     02/13/23  0720 02/14/23  0726   NA 140 140   K 4.2 4.1   CL 104 104   CO2 29 27   BUN 14 17   MG  --  2.1   PHOS  --  4.6   ALT 92* 89*     No results for input(s): "PH", "PCO2", "PO2", "HCO3", "FIO2" in the last 72 hours.    24 Hour Results:  No results found for this or any previous visit (from the past 24 hour(s)).      Imaging:     Assessment:     Lucas Hicks is a 42 y.o. male with documented past medical history of, hypertension, polysubstance use including heroin and cocaine substance-induced mood disorder who IS ADMITTED FOR AMS.       Plan:       Psychosis /  Altered mental state  -Likely multifactorial  -Differentials  include catatonia VS substance-induced mood disorder vs cva vs subclinical seizures vs uric acid manage psychosis  -Unimproving symptoms and behavioral health unit hence transfer to  medicine to evaluate for other secondary causes  -Given the patient's history of strep a cellulitis and abscess in his right index finger will obtain ESR, CRP and blood cultures  -Given his high risk behaviors and drug use check HIV, RPR, Lyme to rule out encephalopathy associated with these  -Plan for MRI brain with and without contrast  -Start 24-hour EEG  -Neurology consultation in a.m.  -Psychiatry on consult: defer antipsychotic use to them    Right index finger cellulitis/abscess  -Complete outpatient antibiotics  -Local wound care    Chronic cocaine abuse  -Recent echo done on 01/14/23 shows a preserved EF of 60 to 65% with left ventricular has normal function no regional wall motion abnormalities and no diastolic dysfunction noted  -Continue counseled on cessation     Chronic hepatitis C  -LFTs currently stable  -Outpatient hepatology follow-up           FEN/GI -  ns@100ml /hr  Activity - as tolerated  DVT prophylaxis - Lovenox  GI prophylaxis -  none indicated  Disposition - home    CODE STATUS:   full code       Signed By: Ellsworth Lennox, MD     Feb 16, 2023

## 2023-02-16 NOTE — Progress Notes (Signed)
Occupational Therapy:  02/16/23      Order received and acknowledged. Spoke with RN, pt currently not following commands and not appropriate for OT eval. Will defer for now and continue to follow.     Thank you,  Margot Ables OTD, OTR/L

## 2023-02-16 NOTE — Progress Notes (Addendum)
1610: Aided pt in feeding. Attempted to allow pt to feed self, but pt was unable to coordinate getting food into his mouth. When food was placed in mouth pt was able to swallow and chew with no issues. When giving liquids pt would chew on straw. RN provided cues to drink, and after multiple redirections he was able to sip 8 oz of water with no issues. Pt able to answer some yes and no questions. Pt repeats what RN or television says. Difficult to obtain accurate blood pressure due to pt constantly shifting and tensing all muscles.     1115: Marcelline Mates, wife to complete MRI screening form. Wife reports that Lamictal and Wellbutrin were prescribed to him for anxiety depression. Stated that she thinks they did well for his mood, but he would still have flight of ideas. Stated that his schizophrenia dx was not formally dx until April of this year - during his first admission.     1300: Aided pt with feeding. Was able to coordinate eating 2 bites of bread. Could not drink from straw, would chew on straw even with simple commands.    1323: Pt had a large bowel movement. Was very resistant to assistance to cleaning. Grabbed and pushed RN and Techs arms and hands. Grabbed at scrotum and anus. Attempted to redirect with simple commands. Continued to grab at scrotum. Cleaned pt up once more. Covered with blanket, seemed to help redirect pt. Performed oral care, pt continues to bite on suction toothbrush.

## 2023-02-16 NOTE — Care Coordination-Inpatient (Signed)
02/16/23 1315   Readmission Assessment   Number of Days since last admission? 1-7 days   Previous Disposition Other (comment)  (Pt was transfered from other facility)   Who is being Systems analyst  (wife)   What was the patient's/caregiver's perception as to why they think they needed to return back to the hospital? Other (Comment)  (Pt was transfered from other facility)   Did you visit your Primary Care Physician after you left the hospital, before you returned this time? No  (Pt trasnfered from other facility)   Did you see a specialist, such as Cardiac, Pulmonary, Orthopedic Physician, etc. after you left the hospital? No  (Pt transfered from other facility)   Who advised the patient to return to the hospital? Physician   Does the patient report anything that got in the way of taking their medications? No   In our efforts to provide the best possible care to you and others like you, can you think of anything that we could have done to help you after you left the hospital the first time, so that you might not have needed to return so soon? Other (Comment)  (Pt trasnfered from other facility)

## 2023-02-17 LAB — RPR: RPR: NONREACTIVE

## 2023-02-17 MED ORDER — SODIUM CHLORIDE 0.9 % IV SOLN
0.9 | INTRAVENOUS | Status: DC
Start: 2023-02-17 — End: 2023-02-23
  Administered 2023-02-17 – 2023-02-23 (×7): via INTRAVENOUS

## 2023-02-17 MED FILL — SODIUM CHLORIDE 0.9 % IV SOLN: 0.9 % | INTRAVENOUS | Qty: 1000

## 2023-02-17 MED FILL — BUPROPION HCL ER (XL) 150 MG PO TB24: 150 MG | ORAL | Qty: 1

## 2023-02-17 MED FILL — ENOXAPARIN SODIUM 40 MG/0.4ML IJ SOSY: 40 MG/0.4ML | INTRAMUSCULAR | Qty: 0.4

## 2023-02-17 MED FILL — THERA PO TABS: ORAL | Qty: 1

## 2023-02-17 NOTE — Progress Notes (Signed)
Neurology Consult Note     NAME: Lucas Hicks   DOB:  Nov 28, 1980   MRN:  [id number]   DATE:  02/17/2023    Assessment and Plan:     42 yo M h/o CHF, HTN, polysubstance abuse (heroin and crack cocaine), cocaine induced paranoia and hallucinations, recent schizophrenia diagnosis, prior intentional overdose presenting with AMS. 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 Per chart review, patient noted to have progressive decline in responsiveness and also noted to have decline in functionality including walking. BH notes also mentioned difficulty with extending limbs and "rigidity". CTH 5/2 showed no acute findings. Labs have been so far unrevealing. Transferred to [facility] for neurologic evaluation. Exam notable for minimal verbal output, somnolence, and increased tone in all four extremities. Etiology unclear but notable spasticity in extremities suggestive of organic lesion in the CNS; however, differential still includes catatonia/other psychiatric disorders. Low suspicion for seizures as cause of symptoms.    5/13:  No major changes overnight. Exam still shows increased tone throughout but notably more in LUE compared to RUE, BLE tone possibly mildly increased. MRI still pending. Updated wife at bedside.    - MRI brain, C, T spine w/wo  - Routine EEG, read pending  - Follow up nutritional labs  - Psychiatry consulted; appreciate assistance    Subjective   Interval events:  See assessment.    HPI:  Lucas Hicks is a 42 y.o. male who presents with AMS (altered mental status). Neurology was consulted for AMS. History obtained per primary team and chart review. Patient was not able to provide any history.    Mr. Marine presents due to AMS. In early April, treated for drug overdose and PNA requiring  intubation. Discharged 4/13 to halfway house but did not stay and continued using drugs on the street. On 4/25, presented to ED for R finger abscess. Reportedly having paranoia and hallucinations during that admission attributed to cocaine use, prompting Zyprexa prescription. Discharged 4/28. Admitted to behavioral health on 5/1 after being found in the middle of the street taking off his clothes. Reportedly A&Ox3 and walking with steady gait at that time but noted to be "confused", though same day notes document that he was responding to internal stimuli and A&Ox1 only. Noted to be incontinent of urine. Mild transaminitis, ALT predominant. Mental status continued to deteriorate. Patient noted to have "rigid" extremities on 5/4, unable to sit up or stand on his own. Cognition and appetite seemed to be improving mildly  over next few days then worsened again 5/7, progressively more sleepy, alert but responding minimally to verbal stimuli. Progressively less responsive but still eating, drinking, and taking medications with nursing assistance. Notably on 5/10 pm, patient was reportedly "very talkative" and "using upper torso to dancing with staff." Tele-neuro reportedly evaluated patient on 5/11 and recommended MRI and EEG. Yesterday, patient reportedly "able to answer most answer appropriately". Arrived at [facility] on 5/12 am.     Pertinent workup: CTH on 5/2 that showed no acute findings, CBC, CMP within normal limits, ESR 18, CRP negative.    ROS:  A comprehensive ROS was negative except as noted in the HPI above.    PMH:  Past Medical History:   Diagnosis Date    Abscess     Right  index finger, per records pt undergone   incision & drainage  and was discharged home on 28th with prescription for Ceftin, Per [facility] ED rn  verball report pton Ceftin 500mg  BID    Asthma     CHF (congestive heart failure) (HCC)     Chronic depression     Scar     surgical scar on Right knee area    Schizophrenia (HCC)     per  prescreen pt has hx of schizophrenia diagnosis    Tattoos     tattoos distributed on upper back, Right nec, left head behind ear, left forarm, Right forarm, Right arm circular tattoe       MEDS:  HOME MEDS:  Prior to Admission Medications   Prescriptions Last Dose Informant Patient Reported? Taking?   OLANZapine zydis (ZYPREXA) 10 MG disintegrating tablet  Outside Pharmacy/PCP Yes No   Sig: Take 1 tablet by mouth nightly   albuterol sulfate HFA (VENTOLIN HFA) 108 (90 Base) MCG/ACT inhaler   Yes No   Sig: Inhale 2 puffs into the lungs every 6 hours as needed for Wheezing   alfuzosin (UROXATRAL) 10 MG extended release tablet  Outside Pharmacy/PCP Yes No   Sig: Take 1 tablet by mouth daily   lamoTRIgine (LAMICTAL) 200 MG tablet  Outside Pharmacy/PCP Yes No   Sig: Take 1 tablet by mouth at bedtime      Facility-Administered Medications: None         CURRENT MEDS:    Current Facility-Administered Medications:     sodium chloride flush 0.9 % injection 5-40 mL, 5-40 mL, IntraVENous, 2 times per day, Ngwafang, Bleck B, MD, 10 mL at 02/17/23 1034    sodium chloride flush 0.9 % injection 5-40 mL, 5-40 mL, IntraVENous, PRN, Ngwafang, Bleck B, MD    0.9 % sodium chloride infusion, , IntraVENous, PRN, Ngwafang, Bleck B, MD    potassium chloride (KLOR-CON) extended release tablet 40 mEq, 40 mEq, Oral, PRN **OR** potassium bicarb-citric acid (EFFER-K) effervescent tablet 40 mEq, 40 mEq, Oral, PRN **OR** potassium chloride 10 mEq/100 mL IVPB (Peripheral Line), 10 mEq, IntraVENous, PRN, Ngwafang, Bleck B, MD    magnesium sulfate 2000 mg in 50 mL IVPB premix, 2,000 mg, IntraVENous, PRN, Ngwafang, Bleck B, MD    enoxaparin (LOVENOX) injection 40 mg, 40 mg, SubCUTAneous, Daily, Ngwafang, Bleck B, MD, 40 mg at 02/17/23 1033    ondansetron (ZOFRAN-ODT) disintegrating tablet 4 mg, 4 mg, Oral, Q8H PRN **OR** ondansetron (ZOFRAN) injection 4 mg, 4 mg, IntraVENous, Q6H PRN, Ngwafang, Bleck B, MD    polyethylene glycol (GLYCOLAX) packet 17 g,  17 g, Oral, Daily PRN, Ngwafang, Bleck B, MD    acetaminophen (TYLENOL) tablet 650 mg, 650 mg, Oral, Q6H PRN **OR** acetaminophen (TYLENOL) suppository 650 mg, 650 mg, Rectal, Q6H PRN, Ngwafang, Bleck B, MD    multivitamin 1 tablet, 1 tablet, Oral, Daily, Lattie Corns, MD, 1 tablet at 02/16/23 1620    buPROPion (WELLBUTRIN XL) extended release tablet 150 mg, 150 mg, Oral, Daily, [name], APRN - CNP, 150 mg at 02/16/23 1620    hydrOXYzine HCl (ATARAX) tablet 50 mg, 50 mg, Oral, TID PRN, [name], APRN - CNP      Allergies   Allergen Reactions    Amoxicillin     Fish Allergy Hives    Penicillin G Hives    Prednisone Other (See Comments)     irritable         SH:  Social History     Socioeconomic History    Marital status: Single     Spouse name: Not on file    Number of children: Not on file  Years of education: Not on file    Highest education level: Not on file   Occupational History    Not on file   Tobacco Use    Smoking status: Never    Smokeless tobacco: Never    Tobacco comments:     Pt non smoker   Vaping Use    Vaping Use: Unknown   Substance and Sexual Activity    Alcohol use: Never    Drug use: Yes     Types: Cocaine     Comment: pt denies drug use but wa spostive for Benzo and cocaine in his drug screen at ED    Sexual activity: Yes     Partners: Female   Other Topics Concern    Not on file   Social History Narrative    Not on file     Social Determinants of Health     Financial Resource Strain: Not on file   Food Insecurity: Patient Unable To Answer (02/05/2023)    Hunger Vital Sign     Worried About Running Out of Food in the Last Year: Patient unable to answer     Ran Out of Food in the Last Year: Patient unable to answer   Transportation Needs: Patient Unable To Answer (02/05/2023)    PRAPARE - Transportation     Lack of Transportation (Medical): Patient unable to answer     Lack of Transportation (Non-Medical): Patient unable to answer   Physical Activity: Not on file   Stress: Not on  file   Social Connections: Not on file   Intimate Partner Violence: Not on file   Housing Stability: Patient Unable To Answer (02/05/2023)    Housing Stability Vital Sign     Unable to Pay for Housing in the Last Year: Patient unable to answer     Number of Places Lived in the Last Year: 0     Unstable Housing in the Last Year: Patient unable to answer           FH:  Family History   Family history unknown: Yes           Objective:     BP (!) 129/92   Pulse (!) 105   Temp 98 F (36.7 C) (Axillary)   Resp 24   Wt 68.3 kg (150 lb 9.2 oz)   SpO2 99%   BMI 22.30 kg/m   Temp (24hrs), Avg:98.2 F (36.8 C), Min:97.9 F (36.6 C), Max:98.7 F (37.1 C)      Physical Exam:  General: Ill appearing patient in no apparent distress.   Pulmonary: No increased WOB  Cardiac: Regular rate and rhythm  Extremities: No cyanosis or edema    Neurological Exam:  Mental Status: Resting with eyes closed, but eyes open to voice and will briefly regard before falling asleep again if not continuously stimulated, did not respond to any questions. Did not follow any commands. Otherwise minimal intelligible verbal output, grunts with most MSK stimulation.   Cranial Nerves:   EOM grossly intact but at least mildly dysconjugate. No appreciable significant facial droop.    Motor:  Did not follow commands in any extremities.    Reflexes:   DTRs 2+ RUE, 1+ LUE, 1+ BLE patellars and absent achilles, but limited by positioning. Toes appear to be mute but patient repeatedly withdrawing with testing, limiting evaluation. No clonus.   Sensory:   Grossly intact   Gait:     Cerebellar:  UTA  STUDIES AND REPORTS:  No results found for this or any previous visit (from the past 24 hour(s)).      IMAGING: Personally reviewed.         Signed:Patrina Andreas Robynn Pane, MD

## 2023-02-17 NOTE — Progress Notes (Signed)
rEEG complete.

## 2023-02-17 NOTE — Plan of Care (Signed)
Problem: Discharge Planning  Goal: Discharge to home or other facility with appropriate resources  02/17/2023 2151 by Frederich Chick, RN  Outcome: Progressing     Problem: Pain  Goal: Verbalizes/displays adequate comfort level or baseline comfort level  02/17/2023 2151 by Frederich Chick, RN  Outcome: Progressing     Problem: Skin/Tissue Integrity  Goal: Absence of new skin breakdown  Description: 1.  Monitor for areas of redness and/or skin breakdown  2.  Assess vascular access sites hourly  3.  Every 4-6 hours minimum:  Change oxygen saturation probe site  4.  Every 4-6 hours:  If on nasal continuous positive airway pressure, respiratory therapy assess nares and determine need for appliance change or resting period.  02/17/2023 2151 by Frederich Chick, RN  Outcome: Progressing     Problem: Safety - Adult  Goal: Free from fall injury  02/17/2023 2151 by Frederich Chick, RN  Outcome: Progressing     Problem: Chronic Conditions and Co-morbidities  Goal: Patient's chronic conditions and co-morbidity symptoms are monitored and maintained or improved  02/17/2023 2151 by Frederich Chick, RN  Outcome: Progressing     Problem: Physical Therapy - Adult  Goal: By Discharge: Performs mobility at highest level of function for planned discharge setting.  See evaluation for individualized goals.  Description: FUNCTIONAL STATUS PRIOR TO ADMISSION: Independent without deficits prior to recent hospitalizations (per pt's wife, pt hasn't been up OOB in the last week and a half at OSH)    HOME SUPPORT PRIOR TO ADMISSION:  Pt has been living in/out of hotels and halfway house over the past month; is unable to return home with his wife due to active substance use.      Physical Therapy Goals  Initiated 02/17/2023  1.  Patient will move from supine to sit and sit to supine and roll side to side in bed with minimal assistance within 7 day(s).    2.  Patient will perform sit to stand with minimal assistance within 7 day(s).  3.  Patient will transfer from bed to  chair and chair to bed with minimal assistance using the least restrictive device within 7 day(s).  4.  Patient will ambulate with minimal assistance for 50 feet with the least restrictive device within 7 day(s).     02/17/2023 1401 by Rubye Oaks, PT  Outcome: Progressing     Problem: Occupational Therapy - Adult  Goal: By Discharge: Performs self-care activities at highest level of function for planned discharge setting.  See evaluation for individualized goals.  Description: FUNCTIONAL STATUS PRIOR TO ADMISSION:  Patient was ambulatory using no DME   , ADL Assistance: Independent,  ,  ,  ,  ,  , Homemaking Assistance: Independent, Ambulation Assistance: Independent, Transfer Assistance: Independent, Active Driver: Yes     HOME SUPPORT: Patient was living in and out of hotels/half way house. Pt unable to return home with wife/children due to active substance abuse    Occupational Therapy Goals:  Initiated 02/17/2023  1.  Patient will perform self-feeding with Contact Guard Assist within 7 day(s).  2.  Patient will perform grooming with Moderate Assist within 7 day(s).  3.  Patient will perform bathing with Moderate Assist within 7 day(s).  4.  Patient will perform toilet transfers with Moderate Assist  within 7 day(s).  5.  Patient will perform all aspects of toileting with Moderate Assist within 7 day(s).  6.  Patient will participate in upper extremity therapeutic exercise/activities with Moderate Assist for 5 minutes  within 7 day(s).    7.  Patient will utilize energy conservation techniques during functional activities with verbal cues within 7 day(s).   02/17/2023 1435 by Margot Ables, OT  Outcome: Progressing

## 2023-02-17 NOTE — Procedures (Signed)
PROCEDURE: ROUTINE INPATIENT EEG  NAME:   Lucas Hicks  ACCOUNT NUMBER : [id number]  MRN:   [id number]  DATE OF SERVICE: 02/17/2023     HISTORY/INDICATION: Patient is a 42 year old male with polysubstance abuse admitted with paranoia, hallucinations, with declining mental status and responsiveness.  EEG is performed to evaluate for evidence of underlying seizure activity.    MEDICATIONS:   Current Facility-Administered Medications   Medication Dose Route Frequency Provider Last Rate Last Admin    sodium chloride flush 0.9 % injection 5-40 mL  5-40 mL IntraVENous 2 times per day Ngwafang, Bleck B, MD   10 mL at 02/17/23 1034    sodium chloride flush 0.9 % injection 5-40 mL  5-40 mL IntraVENous PRN Ngwafang, Bleck B, MD        0.9 % sodium chloride infusion   IntraVENous PRN Ngwafang, Bleck B, MD        potassium chloride (KLOR-CON) extended release tablet 40 mEq  40 mEq Oral PRN Ngwafang, Bleck B, MD        Or    potassium bicarb-citric acid (EFFER-K) effervescent tablet 40 mEq  40 mEq Oral PRN Ngwafang, Bleck B, MD        Or    potassium chloride 10 mEq/100 mL IVPB (Peripheral Line)  10 mEq IntraVENous PRN Ngwafang, Bleck B, MD        magnesium sulfate 2000 mg in 50 mL IVPB premix  2,000 mg IntraVENous PRN Ngwafang, Bleck B, MD        enoxaparin (LOVENOX) injection 40 mg  40 mg SubCUTAneous Daily Ngwafang, Bleck B, MD   40 mg at 02/17/23 1033    ondansetron (ZOFRAN-ODT) disintegrating tablet 4 mg  4 mg Oral Q8H PRN Ngwafang, Bleck B, MD        Or    ondansetron (ZOFRAN) injection 4 mg  4 mg IntraVENous Q6H PRN Ngwafang, Bleck B, MD        polyethylene glycol (GLYCOLAX) packet 17 g  17 g Oral Daily PRN Ngwafang, Bleck B, MD        acetaminophen (TYLENOL) tablet 650 mg  650 mg Oral Q6H PRN Ngwafang, Bleck B, MD        Or    acetaminophen (TYLENOL) suppository 650 mg  650 mg Rectal Q6H PRN Ngwafang, Bleck B, MD        multivitamin 1 tablet  1 tablet Oral Daily Lattie Corns, MD   1 tablet at 02/16/23 1620     buPROPion (WELLBUTRIN XL) extended release tablet 150 mg  150 mg Oral Daily [name], APRN - CNP   150 mg at 02/16/23 1620    hydrOXYzine HCl (ATARAX) tablet 50 mg  50 mg Oral TID PRN [name], APRN - CNP           CONDITIONS OF RECORDING: This is a routine 21-channel EEG recording performed in accordance with the international 10-20 system with one channel devoted to limited EKG. This study was done during a poorly responsive state. Photic stimulation was performed as an activating procedure.    DESCRIPTION:   As the record opens, there is high amplitude polymorphic 2-5 Hz activity seen across all head regions.  Photic stimulation did not significantly alter the tracing. No normal sleep architecture is seen.  There are no focal abnormalities, epileptiform discharges, or electrographic seizures seen.     INTERPRETATION: Abnormal due to diffuse generalized delta slowing    CLINICAL CORRELATION: Finding is consistent with a severe encephalopathy which  can have many etiologies including toxic, metabolic, or medication effect.  Clinical correlation advised.    Jodi Mourning, MD

## 2023-02-17 NOTE — Plan of Care (Signed)
Problem: Occupational Therapy - Adult  Goal: By Discharge: Performs self-care activities at highest level of function for planned discharge setting.  See evaluation for individualized goals.  Description: FUNCTIONAL STATUS PRIOR TO ADMISSION:  Patient was ambulatory using no DME   , ADL Assistance: Independent,  ,  ,  ,  ,  , Homemaking Assistance: Independent, Ambulation Assistance: Independent, Transfer Assistance: Independent, Active Driver: Yes     HOME SUPPORT: Patient was living in and out of hotels/half way house. Pt unable to return home with wife/children due to active substance abuse    Occupational Therapy Goals:  Initiated 02/17/2023  1.  Patient will perform self-feeding with Contact Guard Assist within 7 day(s).  2.  Patient will perform grooming with Moderate Assist within 7 day(s).  3.  Patient will perform bathing with Moderate Assist within 7 day(s).  4.  Patient will perform toilet transfers with Moderate Assist  within 7 day(s).  5.  Patient will perform all aspects of toileting with Moderate Assist within 7 day(s).  6.  Patient will participate in upper extremity therapeutic exercise/activities with Moderate Assist for 5 minutes within 7 day(s).    7.  Patient will utilize energy conservation techniques during functional activities with verbal cues within 7 day(s).   Outcome: Progressing   OCCUPATIONAL THERAPY EVALUATION    Patient: Lucas Hicks (42 y.o. male)  Date: 02/17/2023  Primary Diagnosis: AMS (altered mental status) [R41.82]         Precautions: Fall Risk                  ASSESSMENT :  The patient is limited by decreased functional mobility, independence in ADLs, high-level IADLs, ROM, strength, body mechanics, activity tolerance, endurance, safety awareness, cognition, command following, attention/concentration, coordination, balance, vision/visual deficit, posture, fine-motor control s/p admission for psychosis/AMS. Per wife, pt was living in and out of hotels and half way house,  unable to return home 2/2 children being present and active substance abuse. Wife reports prior to admission pt was independent.       Upon arrival pt was received semi-reclined in bed, agreeable to therapy, cleared by RN. Pt was Max x2 for supine<>sit, attempted visual screening EOB, pt demonstrated difficulty with formal testing but noted informally difficulty with tracking to L. Pt demonstrated decreased motor planning, Max x2 for sit<>stand and Max A for posterior hygiene in standing. Pt was Left semi-reclined in bed with all needs met, call bell in reach. Will continue to follow in acute setting.      Functional Outcome Measure:  The patient scored 7/24 on the AMPAC outcome measure        PLAN :  Recommendations and Planned Interventions:   self care training, therapeutic activities, functional mobility training, balance training, therapeutic exercise, endurance activities, patient education, home safety training, and family training/education    Frequency/Duration: OT Plan of Care: 3 times/week    Recommendation for discharge: (in order for the patient to meet his/her long term goals): Therapy up to 5 days/week in Skilled nursing facility     Other factors to consider for discharge: available support system works or is unable to provide adequate supervision and the patient would be alone, patient's current support system is unable to meet their requirements for physical assistance, poor safety awareness, impaired cognition, high risk for falls, not safe to be alone, and concern for safely navigating or managing the home environment    IF patient discharges home will need the following DME: continuing  to assess with progress       SUBJECTIVE:   Patient stated, "hello."  OBJECTIVE DATA SUMMARY:     Past Medical History:   Diagnosis Date    Abscess     Right  index finger, per records pt undergone   incision & drainage  and was discharged home on 28th with prescription for Ceftin, Per [facility] ED rn verball  report pton Ceftin 500mg  BID    Asthma     CHF (congestive heart failure) (HCC)     Chronic depression     Scar     surgical scar on Right knee area    Schizophrenia (HCC)     per prescreen pt has hx of schizophrenia diagnosis    Tattoos     tattoos distributed on upper back, Right nec, left head behind ear, left forarm, Right forarm, Right arm circular tattoe     Past Surgical History:   Procedure Laterality Date    APPENDECTOMY      as noted on CT done in 2022          Expanded or extensive additional review of patient history:   Social/Functional History  Lives With: Spouse (and 3 children (36 y/o, 78 y/o, 71 y/o))  Type of Home: Condo  Home Layout: Multi-level, Able to Live on Main level with bedroom/bathroom  Home Access: Stairs to enter without rails  Entrance Stairs - Number of Steps: 1  Bathroom Shower/Tub: Chief of Staff: None  Has the patient had two or more falls in the past year or any fall with injury in the past year?: No  ADL Assistance: Independent  Homemaking Assistance: Independent  Ambulation Assistance: Independent  Transfer Assistance: Independent  Active Driver: Yes  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)  Additional Comments: recently released from jail 12/31/22; recently living in hotels and in/out of halfway houses; unable to return home with wife due to substance use and young children in home          EXAMINATION OF PERFORMANCE DEFICITS:    Cognitive/Behavioral Status:  Orientation  Overall Orientation Status: Impaired  Orientation Level: Disoriented to place;Disoriented to time;Disoriented to situation  Cognition  Overall Cognitive Status: Exceptions  Arousal/Alertness: Impaired;Inconsistent responses to stimuli  Following Commands: Impaired;Inconsistently follows commands  Attention Span: Unable to maintain attention  Memory: Impaired  Safety Judgement: Decreased awareness of need for  assistance;Decreased awareness of need for safety  Insights: Not aware of deficits  Initiation: Requires cues for all  Sequencing: Requires cues for all      Vision/Perceptual:           Unable to formally assess, appears difficulty tracking to L side       Range of Motion:   AROM: Generally decreased, functional         Strength:  Strength: Generally decreased, functional      Coordination:  Coordination: Generally decreased, functional     Coordination: Grossly decreased, non-functional      Tone & Sensation:   Tone: Normal  Sensation: Intact          Functional Mobility and Transfers for ADLs:    Bed Mobility:     Bed Mobility Training  Bed Mobility Training: Yes  Overall Level of Assistance: Maximum assistance;Assist X2  Interventions: Verbal cues;Safety awareness training;Manual cues  Supine to Sit: Maximum assistance;Assist X2  Sit to Supine: Maximum assistance;Assist X2  Scooting: Total assistance  Transfers:      Art therapist: Yes  Overall Level of Assistance: Assist X2;Maximum assistance  Interventions: Visual cues;Manual cues;Verbal cues  Sit to Stand: Assist X2;Maximum assistance  Stand to Sit: Maximum assistance;Assist X2                     Balance:      Balance  Sitting: Impaired  Sitting - Static: Poor (constant support)  Sitting - Dynamic: Poor (constant support);Unsupported  Standing: Impaired  Standing - Static: Constant support;Poor  Standing - Dynamic: Constant support;Poor      ADL Assessment:          Feeding: Minimal assistance;Moderate assistance       Grooming: Maximum assistance       UE Bathing: Maximum assistance            LE Bathing: Maximum assistance       UE Dressing: Maximum assistance       LE Dressing: Maximum assistance       Toileting: Maximum assistance                         ADL Intervention and task modifications:                  [employer] AM-PACTM "6 Clicks"                                                       Daily Activity Inpatient  Short Form  How much help from another person does the patient currently need... Total; A Lot A Little None   1.  Putting on and taking off regular lower body clothing? [x]   1 []   2 []   3 []   4   2.  Bathing (including washing, rinsing, drying)? [x]   1 []   2 []   3 []   4   3.  Toileting, which includes using toilet, bedpan or urinal? [x]  1 []   2 []   3 []   4   4.  Putting on and taking off regular upper body clothing? [x]   1 []   2 []   3 []   4   5.  Taking care of personal grooming such as brushing teeth? [x]   1 []   2 []   3 []   4   6.  Eating meals? []   1 [x]   2 []   3 []   4    2007, Trustees of [address], under license to [employer]. All rights reserved     Score: 7/24     Interpretation of Tool:  Represents clinically-significant functional categories (i.e. Activities of daily living).    Cutoff score 39.4 (19) correlates to a good likelihood of discharging home versus a facility  Diane U. Frederick Peers, Janeece Riggers, Leslie Andrea, Lupe Carney. Passek, Thornton Dales. Cassandria Anger, AM-PAC "6-Clicks" Functional Assessment Scores Predict Acute Care Hospital Discharge Destination, Physical Therapy, Volume 94, Issue 9, 07 June 2013, Pages [phone], [email or web address]        Activity Tolerance:   Poor and requires frequent rest breaks    After treatment:   Patient left in no apparent distress in bed, Call bell within reach, Bed/ chair alarm activated, Caregiver / family present, and Side rails x3    COMMUNICATION/EDUCATION:  The patient's plan of care was discussed with: physical therapist and registered nurse         Thank you for this referral.  Margot Ables, OT  Minutes: 28    Occupational Therapy Evaluation Charge Determination   History Examination Decision-Making   LOW Complexity : Brief history review  LOW Complexity: 1-3 Performance deficits relating to physical, cognitive, or psychosocial skills that result in activity limitations and/or participation restrictions LOW  Complexity: No comorbidities that affect functional and  no verbal  or physical assist needed to complete eval tasks   Based on the above components, the patient evaluation is determined to be of the following complexity level: Low

## 2023-02-17 NOTE — Other (Addendum)
[facility] Liaison Team Note     LOS:  2     Patient goal(s) for today: take medications as prescribed, make needs known in an appropriate manner.  [facility] Liaison team focus/goals: assess MH needs, provide therapeutic support, brief therapy, and education, as needed.  Assist medical care management team with recommendations for coordination of care.    Psychiatric Consult: psychosis.  Daily consult     Progress note: Pt was transferred to [facility] for a medical workup, from [facility], where he was admitted as a TDO on 02/05/23.  Chart review from 02/05/23, indicates pt was taken to [facility] by EMS after being found in the middle of the street taking his clothes off - an ECO was issued due to capacity concerns: UDS (+) cocaine, benzos. Pt was subsequently admitted to [name].      Liaison met with pt, FTF, on the medical floor.  Pt was sleeping soundly, with wife, Lucas Hicks, at bedside.  Pt could not be awakened.  Lucas Hicks reports pt just finished with PT and is "completely out".  [name] tearfully  reports she and the pt have been married for 5 years - no children together, but she has children from a previous marriage.  She reports whenever pt relapses she does not allow him to come home, because of her young children, therefore, he will usually stay in hotels.  [name] reports pt has spent upwards of $10,000 in 1 month on drugs and hotels.  She reports pt was released from jail on 12/31/22, relapsed, and was transported to the ER on 01/10/23: UDS (+) opiates, benzos, suboxone, cocaine, fentanyl.  Pt discharged home on 01/18/23.  Went to the ER at [facility], for finger pain on 01/30/23 and discharged on 02/02/23.  Relapsed again, taken to [facility] ER, 02/04/23, by police, under ECO, and was admitted to [facility] as a TDO on 02/05/23.  Chart review indicates pt was discharged to a halfway house, sometime between 01/18/23 and 02/02/23, but left after 3 hours.  Lucas Hicks reports pt completed 4 months of a 7  month SUD program in [place], [facility], but was sent to jail for 8 months due to charges brought against him at the program - Lucas Hicks did not specify charges.  Lucas Hicks is observed to be tearful and very concerned for her husband.  Liaison provided [facility] with therapeutic support, encouragement and validation of feelings.  Liaison team will continue to monitor and support, as needed.       Barriers to Discharge: medical clearance     Outpatient provider(s):  none reported  Insurance info/prescription coverage:  Medicaid of [place]    Diagnosis: Per [name], NP, "Polysubstance abuse disorder, ?  Underlying schizophrenia?. Delirium secondary to either medical or mental/withdrawal underlying origin"     Dx From [facility]:   Major depressive disorder by history  Generalized anxiety disorder by history  Stimulant/cocaine use disorder severity unspecified  History of opiate use disorder  Rule out sedative-hypnotic use disorder  Rule out delirium    Plan:  Return to [facility] when medically cleared.  Please refer to most recent psychiatric consult note and medical team for recommendations and disposition.     Follow up Psych Consult placed? Yes   Psychiatrist updated? No      Participating treatment team members: [name], [name], LCSW    [name] ([address]) [name], [name]  [facility] Liaison  Available on [facility]

## 2023-02-17 NOTE — Consults (Signed)
[facility]  PSYCHIATRY CONSULT NOTE:    Name: Lucas Hicks  Lucas#: [id number]  DOB: 11-Jul-1981  ACCOUNT#: [id number]  ADMIT DATE: 02/15/2023    REASON FOR CONSULT: AMS/psychosis    Information obtained from: Chart, Wife, RN    INTERVAL UPDATE: 02/17/2023  Lucas Hicks is seen today while lying in bed. He is mute today not answering my questions but he will nod or shake his head in efforts to answer my questions and does so appropriately. He is mostly staring straight ahead but will occasionally make eye contact with me. His nurse informed me that his wife had jkust left and she went to try and get her for me but was unable to catch up to her. He did have an EEG today and will soon be having an MRI which hopefully data from both/either can add some additional information regarding his present waxy and wane mental presentation. No knowledge if ever dx with substance induced chronic memory loss/dementia. He is only 89 y/o but his wife does also share this deterioration has been going on for "some time". Spoke with her last evening for more than 30 minutes. Have restarted his Wellbutrin which she had said was helpful previously and will await for results from testing today to further assess next steps. Lucas Hicks is able to shake his head and denies and SI, Lucas Hicks, A/VH.       HISTORY OF PRESENTING COMPLAINT:  Lucas Hicks is a 42 y.o. male with PMH of polysubstance abuse, (heroin and cocaine) CHF, hypertension, ?schizophrenia dx questioned,  He is currently seen on neuroscience telemetry while sitting all on his bed .  He is mostly appropriate with me today he is easily distracted but able to be reoriented.  Denies any thoughts of suicide, homicide, he does have some audio or visual hallucinations from time to time did not denies any now presently.  He is unsure if they are command in nature or just sounds at this time cannot describe.  Recently had been admitted to behavioral health unit and decompensated physically brought here  for treatment. Denies any new psychiatric symptoms. He has a bizarre affect and sitting is odd fashion across the bed when I entered and easily distracted. Other than these present symptoms, no new psychiatric symptoms reported.     PAST PSYCHIATRIC HISTORY: Schizophrenia?  Major depressive disorder    SUBSTANCE ABUSE HISTORY: Heroin and cocaine addiction    PSYCHOSOCIAL HISTORY: Lives with wife who is supportive    MENTAL STATUS EXAM:   Lucas Hicks is a 42 y.o. White (non-Hispanic) male who appears his stated age. He is largely cooperative with assessment questions. He makes fair eye contact.  No psychomotor agitation/retardation is observed. His speech is slow in rate, low tone, and volume. His self-reported mood is "OK". His affect is blunt. He denies auditory and visual hallucinations. No paranoia or delusions are elicited with assessment but informed by RN he has delusional thinking or fears about police officers. His thought processes are disorganized. He denies suicidal and homicidal ideation. [name] X 3. His recent and remote memory is impaired Insight and judgment are limited/poor.    DIAGNOSTIC IMPRESSION: Polysubstance abuse disorder, ?  Underlying schizophrenia?. Delirium secondary to either medical or mental/withdrawal underlying origin      ASSESSMENT/PLAN:     UPDATE: 02/17/2023  No changes from previous recommendations on 02/16/2023; will await results from EEG and MRI to assess for meds or psychiatric care based on gained information  if relevant     Continue with altered mental status/delirium prophylaxis care to include reorientation ensuring he has proper nutrition and hydration and other daily needs.    Make sure all medical acute ailments are addressed and treated.    I am evaluating medications and will likely start on Wellbutrin and then Lamictal which have worked well together previously (Wellbutrin was stopped in prison due to cost)    Hydroxyzine as needed for  anxiety    Psychiatry will gladly follow during his stay for any recommended medication management    Thank you for the opportunity to participate in the care of your patient. Please re-consult psychiatry as needed.

## 2023-02-17 NOTE — Plan of Care (Signed)
Problem: Physical Therapy - Adult  Goal: By Discharge: Performs mobility at highest level of function for planned discharge setting.  See evaluation for individualized goals.  Description: FUNCTIONAL STATUS PRIOR TO ADMISSION: Independent without deficits prior to recent hospitalizations (per pt's wife, pt hasn't been up OOB in the last week and a half at OSH)    HOME SUPPORT PRIOR TO ADMISSION:  Pt has been living in/out of hotels and halfway house over the past month; is unable to return home with his wife due to active substance use.      Physical Therapy Goals  Initiated 02/17/2023  1.  Patient will move from supine to sit and sit to supine and roll side to side in bed with minimal assistance within 7 day(s).    2.  Patient will perform sit to stand with minimal assistance within 7 day(s).  3.  Patient will transfer from bed to chair and chair to bed with minimal assistance using the least restrictive device within 7 day(s).  4.  Patient will ambulate with minimal assistance for 50 feet with the least restrictive device within 7 day(s).     Outcome: Progressing   PHYSICAL THERAPY EVALUATION    Patient: Lucas Hicks (42 y.o. male)  Date: 02/17/2023  Primary Diagnosis: AMS (altered mental status) [R41.82]       Precautions: Restrictions/Precautions: Fall Risk                      ASSESSMENT :   Pt is 42 y/o M h/o CHF, HTN, polysubstance abuse (heroin and crack cocaine), cocaine induced paranoia and hallucinations, recent schizophrenia diagnosis, prior intentional overdose presenting 5/12 with AMS and admitted for further work-up. 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      Pt seen for PT evaluation this date with fair tolerance to PT intervention, but currently presents at a significant decline from his functional baseline, most significantly limited by cognitive deficits, motor planning deficits, and coordination  deficits.  Pt currently requiring overall MaxA of 2 for all bed mobility, able to sit supported at EOB with overall MinA, and performed sit<->stand (EOB x2) with MaxA of 2.  Pt stood for 30 seconds x2 with overall MaxA of 2 with retropulsion posteriorly throughout, unable to achieve full trunk/hip extension to stand fully erect in stance.  Ultimately returned to supine in bed as patient unsafe to remain up OOBTC this date due to cognitive deficits.  Pt will benefit from ongoing acute PT intervention in order to further address deficits, maximize functioning, and progress independence.  Anticipate pt will make slow progress with ongoing acute PT intervention until cognitive deficits improve.  Anticipate pt will require ongoing skilled PT intervention in a subacute rehab facility to maximize functioning and independence upon discharge.     DEFICITS/IMPAIRMENTS:   The patient is limited by decreased functional mobility, body mechanics, activity tolerance, endurance, safety awareness, cognition, command following, coordination, balance, proprioception     Functional Outcome Measure:  The patient scored 9 on the AMPAC outcome measure.         PLAN :  Recommendations and Planned Interventions:   bed mobility training, transfer training, gait training, therapeutic exercises, neuromuscular re-education, patient and family training/education, and therapeutic activities    Frequency/Duration: Patient will be followed by physical therapy to address goals, PT Plan of Care: 3 times/week to address goals.      Recommendation for discharge: (in  order for the patient to meet his/her long term goals): Continue to assess pending progress (anticipate SNF unless cognitive status significantly improves)    Other factors to consider for discharge: no support system, poor safety awareness, impaired cognition, high risk for falls, not safe to be alone, and concern for safely navigating or managing the home environment    IF patient  discharges home will need the following DME: continuing to assess with progress         SUBJECTIVE:   Patient stated mostly non-verbal with intermittent incoherent speech; able to state last name and birthday; unable to state first name    OBJECTIVE DATA SUMMARY:     Pt received supine in bed; RN cleared patient for participation in PT session.  OT present.   Past Medical History:   Diagnosis Date    Abscess     Right  index finger, per records pt undergone   incision & drainage  and was discharged home on 28th with prescription for Ceftin, Per [facility] ED rn verball report pton Ceftin 500mg  BID    Asthma     CHF (congestive heart failure) (HCC)     Chronic depression     Scar     surgical scar on Right knee area    Schizophrenia (HCC)     per prescreen pt has hx of schizophrenia diagnosis    Tattoos     tattoos distributed on upper back, Right nec, left head behind ear, left forarm, Right forarm, Right arm circular tattoe     Past Surgical History:   Procedure Laterality Date    APPENDECTOMY      as noted on CT done in 2022       Home Situation:  Social/Functional History  Lives With: Spouse (and 3 children (41 y/o, 56 y/o, 57 y/o))  Type of Home: Condo  Home Layout: Multi-level, Able to Live on Main level with bedroom/bathroom  Home Access: Stairs to enter without rails  Entrance Stairs - Number of Steps: 1  Bathroom Shower/Tub: Chief of Staff: None  Has the patient had two or more falls in the past year or any fall with injury in the past year?: No  ADL Assistance: Independent  Homemaking Assistance: Independent  Ambulation Assistance: Independent  Transfer Assistance: Independent  Active Driver: Yes  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)  Additional Comments: recently released from jail 12/31/22; recently living in hotels and in/out of halfway houses; unable to return home with wife due to substance use and young  children in home    Cognitive/Behavioral Status:  Orientation  Overall Orientation Status: Impaired  Orientation Level: Disoriented to place;Disoriented to time;Disoriented to situation  Cognition  Overall Cognitive Status: Exceptions  Arousal/Alertness: Impaired;Inconsistent responses to stimuli  Following Commands: Impaired;Inconsistently follows commands  Attention Span: Unable to maintain attention  Memory: Impaired  Safety Judgement: Decreased awareness of need for assistance;Decreased awareness of need for safety  Insights: Not aware of deficits  Initiation: Requires cues for all  Sequencing: Requires cues for all                Strength:    Strength: Grossly decreased, non-functional (unable to appropriately assess due to cognitive deficits; grossly weak, but able to stand without knee buckling, but unable to stand fully erect or take steps to fully assess)    Tone & Sensation:   Tone: Abnormal (unable to formally assess--unclear if  resistance to movement vs tone vs spasticity)  Sensation: Impaired (unable to formally assess due to cognitive deficits)    Coordination:  Coordination: Grossly decreased, non-functional    Range Of Motion:  AROM: Generally decreased, functional       Functional Mobility:  Bed Mobility:     Bed Mobility Training  Bed Mobility Training: Yes  Overall Level of Assistance: Maximum assistance;Assist X2  Interventions: Verbal cues;Safety awareness training;Manual cues  Supine to Sit: Maximum assistance;Assist X2  Sit to Supine: Maximum assistance;Assist X2  Scooting: Total assistance  Transfers:     Art therapist: Yes  Overall Level of Assistance: Assist X2;Maximum assistance  Interventions: Visual cues;Manual cues;Verbal cues  Sit to Stand: Assist X2;Maximum assistance  Stand to Sit: Maximum assistance;Assist X2  Balance:               Balance  Sitting: Impaired  Sitting - Static: Poor (constant support)  Sitting - Dynamic: Poor (constant  support);Unsupported  Standing: Impaired  Standing - Static: Constant support;Poor  Standing - Dynamic: Constant support;Poor                                                                                                                                                                                                                                            [employer] AM-PAC      Basic Mobility Inpatient Short Form (6-Clicks) Version 2    How much help is needed turning from your back to your side while in a flat bed without using bedrails?: A Lot  How much help is needed moving from lying on your back to sitting on the side of a flat bed without using bedrails?: A Lot  How much help is needed moving to and from a bed to a chair?: Total  How much help is needed standing up from a chair using your arms?: A Lot  How much help is needed walking in hospital room?: Total  How much help is needed climbing 3-5 steps with a railing?: Total    AM-PAC Inpatient Mobility Raw Score : 9  AM-PAC Inpatient T-Scale Score : 30.55     Cutoff score ?171,2,3 had higher odds of discharging home with home health or need of SNF/IPR.    1. Emelia Loron, Janeece Riggers, Vinoth Fransico Meadow, Lupe Carney Passek, Thornton Dales. Cassandria Anger.  Validity of the AM-PAC "6-Clicks" Inpatient Daily Activity and Basic Mobility Short Forms. Physical Therapy Mar 2014, 94 (3) 379-391; DOI: 10.2522/ptj.20130199  2. Venetia Night. Association of AM-PAC "6-Clicks" Basic Mobility and Daily Activity Scores With Discharge Destination. Phys Ther. 2021 Apr 4;101(4):pzab043. doi: 10.1093/ptj/pzab043. PMID: 16109604.  3. Herbold J, Rajaraman D, Lubertha Basque, Agayby K, [address] S. Activity Measure for Post-Acute Care "6-Clicks" Basic Mobility Scores Predict Discharge Destination After Acute Care Hospitalization in Select Patient Groups: A Retrospective, Observational Study. Arch Rehabil Res Clin Transl. 2022 Jul  16;4(3):100204. doi: 10.1016/j.arrct.[id number]. PMID: 78295621; PMCID: HYQ6578469.  4. Josefina Do, Coster W, Ni P. AM-PAC Short Forms Manual 4.0. Revised 11/2018.                                                                                                                                                                                                                               Pain Rating:  No c/o pain    Activity Tolerance:   Poor    After treatment:   Patient left in no apparent distress in bed, Bed/ chair alarm activated, and Caregiver / family present; Updated activity board.     COMMUNICATION/EDUCATION:   The patient's plan of care was discussed with: occupational therapist and registered nurse    Patient Education  Education Given To: Patient;Family  Education Provided: Role of Therapy;Plan of Care  Education Method: Verbal  Barriers to Learning: Cognition  Education Outcome: Continued education needed    Thank you for this referral.  Rubye Oaks, PT  Minutes: 30      Physical Therapy Evaluation Charge Determination   History Examination Presentation Decision-Making   MEDIUM  Complexity : 1-2 comorbidities / personal factors will impact the outcome/ POC  MEDIUM Complexity : 3 Standardized tests and measures addressin body structure, function, activity limitation and / or participation in recreation  MEDIUM Complexity : Evolving with changing characteristics  AM-PAC  MEDIUM   Based on the above components, the patient evaluation is determined to be of the following complexity level: Medium

## 2023-02-17 NOTE — Plan of Care (Signed)
Problem: Discharge Planning  Goal: Discharge to home or other facility with appropriate resources  02/17/2023 1140 by Cheri Rous, RN  Outcome: Progressing  02/16/2023 2208 by Frederich Chick, RN  Outcome: Progressing     Problem: Pain  Goal: Verbalizes/displays adequate comfort level or baseline comfort level  02/17/2023 1140 by Cheri Rous, RN  Outcome: Progressing  02/16/2023 2208 by Frederich Chick, RN  Outcome: Progressing     Problem: Skin/Tissue Integrity  Goal: Absence of new skin breakdown  Description: 1.  Monitor for areas of redness and/or skin breakdown  2.  Assess vascular access sites hourly  3.  Every 4-6 hours minimum:  Change oxygen saturation probe site  4.  Every 4-6 hours:  If on nasal continuous positive airway pressure, respiratory therapy assess nares and determine need for appliance change or resting period.  02/17/2023 1140 by Cheri Rous, RN  Outcome: Progressing  02/16/2023 2208 by Frederich Chick, RN  Outcome: Progressing     Problem: Safety - Adult  Goal: Free from fall injury  02/17/2023 1140 by Cheri Rous, RN  Outcome: Progressing  02/16/2023 2208 by Frederich Chick, RN  Outcome: Progressing     Problem: Chronic Conditions and Co-morbidities  Goal: Patient's chronic conditions and co-morbidity symptoms are monitored and maintained or improved  02/17/2023 1140 by Cheri Rous, RN  Outcome: Progressing  02/16/2023 2208 by Frederich Chick, RN  Outcome: Progressing

## 2023-02-17 NOTE — Progress Notes (Incomplete)
[name]                                                                                          Hospitalist Progress Note  Rogue Bussing, MD  Office Phone: [phone]        Date of Service:  02/17/2023  NAME:  Lucas Hicks  DOB:  November 28, 1980  MRN:  [id number]       Admission Summary:   42 y.o man with a history of CHF, HTN, polysubstance abuse including heroin and cocaine, ?schizophrenia, who was admitted to an outside behavioral health unit on May 1 for bizarre behavior and paranoia. He was transferred here for medical work-up due to lack of improvement at [facility].       Interval history / Subjective:   He is a poor historian and unable to provide any history. Undergoing EEG     Assessment & Plan:     AMS/psychosis  -etiology remains unclear  -awaiting MRI brain, added C/T spine per neuro recs  -EEG ongoing  -neurology and psychiatry consultation    R index finger cellulitis w/ abscess:  -complete outpt abx    Polysubstance abuse    Chronic HCV          Code status: full  Prophylaxis: sc Lovenox  Care Plan discussed with: rn/cm  Anticipated Disposition:   Inpatient  Cardiac monitoring: Telemetry  Central Line:            Social Determinants of Health     Tobacco Use: Low Risk  (02/06/2023)    Patient History     Smoking Tobacco Use: Never     Smokeless Tobacco Use: Never     Passive Exposure: Not on file   Alcohol Use: Not At Risk (02/05/2023)    AUDIT-C     Frequency of Alcohol Consumption: Never     Average Number of Drinks: Patient does not drink     Frequency of Binge Drinking: Never   Financial Resource Strain: Not on file   Food Insecurity: Patient Unable To Answer (02/05/2023)    Hunger Vital Sign     Worried About Running Out of Food in the Last Year: Patient unable to answer     Ran Out of Food in the Last Year: Patient unable to answer   Transportation Needs: Patient Unable To Answer (02/05/2023)    PRAPARE - Transportation     Lack of Transportation (Medical):  Patient unable to answer     Lack of Transportation (Non-Medical): Patient unable to answer   Physical Activity: Not on file   Stress: Not on file   Social Connections: Not on file   Intimate Partner Violence: Not on file   Depression: Not at risk (02/05/2023)    PHQ-2     PHQ-2 Score: 0   Housing Stability: Patient Unable To Answer (02/05/2023)    Housing Stability Vital Sign     Unable to Pay for Housing in the Last Year: Patient unable to answer     Number of Places Lived in the Last Year: 0     Unstable  Housing in the Last Year: Patient unable to answer   Interpersonal Safety: Patient Unable To Answer (02/05/2023)    Interpersonal Safety Domain Source: IP Abuse Screening     Physical abuse: Unable to assess     Verbal abuse: Unable to assess     Emotional abuse: Unable to assess      Financial abuse: Unable to assess      Sexual abuse: Unable to assess    Utilities: Patient Unable To Answer (02/05/2023)    [facility] Utilities     Threatened with loss of utilities: Patient unable to answer       Review of Systems:   Review of systems not obtained due to patient factors.       Vital Signs:    Last 24hrs VS reviewed since prior progress note. Most recent are:  Vitals:    02/17/23 1400   BP:    Pulse: 65   Resp:    Temp:    SpO2:          Intake/Output Summary (Last 24 hours) at 02/17/2023 1455  Last data filed at 02/17/2023 0208  Gross per 24 hour   Intake --   Output 340 ml   Net -340 ml          Physical Examination:     I had a face to face encounter with this patient and independently examined them on 02/17/2023 as outlined below:          General : NAD, confused  HEENT: anicteric sclerae  Neck: supple, no JVD, no meningeal signs  Chest: Clear to auscultation bilaterally   CVS: S1 S2 heard, Capillary refill less than 2 seconds  Abd: soft/ non tender, non distended, BS physiological,   Ext: no clubbing, no cyanosis, no edema  Neuro/Psych: grossly non-focal, spastic extremities, and tremulous  Skin: warm     Data Review:     Review and/or order of clinical lab test  Review and/or order of tests in the radiology section of CPT  Review and/or order of tests in the medicine section of CPT      I have personally and independently reviewed all pertinent labs, diagnostic studies, imaging, and have provided independent interpretation of the same.     Labs:     Recent Labs     02/16/23  0214   WBC 7.8   HGB 13.7   HCT 43.3   PLT 264       Recent Labs     02/16/23  0214   NA 138   K 4.8   CL 105   CO2 29   BUN 19       Recent Labs     02/16/23  0214   ALT 99*   GLOB 4.3*       No results for input(s): "INR", "APTT" in the last 72 hours.    Invalid input(s): "PTP"   No results for input(s): "TIBC", "FERR" in the last 72 hours.    Invalid input(s): "FE", "PSAT"   No results found for: "RBCF"   No results for input(s): "PH", "PCO2", "PO2" in the last 72 hours.  No results for input(s): "CPK" in the last 72 hours.    Invalid input(s): "CPKMB", "CKNDX", "TROIQ"  Lab Results   Component Value Date/Time    CHOL 143 02/06/2023 11:40 AM    HDL 38 02/06/2023 11:40 AM    LDL 88.4 02/06/2023 11:40 AM     No results found for: "  GLUCPOC"  @LABUA @    Notes reviewed from all clinical/nonclinical/nursing services involved in patient's clinical care. Care coordination discussions were held with appropriate clinical/nonclinical/ nursing providers based on care coordination needs.         Patients current active Medications were reviewed, considered, added and adjusted based on the clinical condition today.      Home Medications were reconciled to the best of my ability given all available resources at the time of admission. Route is PO if not otherwise noted.      Admission Status:[id number]:::1}      Medications Reviewed:     Current Facility-Administered Medications   Medication Dose Route Frequency    sodium chloride flush 0.9 % injection 5-40 mL  5-40 mL IntraVENous 2 times per day    sodium chloride flush 0.9 % injection 5-40 mL  5-40 mL IntraVENous PRN    0.9  % sodium chloride infusion   IntraVENous PRN    potassium chloride (KLOR-CON) extended release tablet 40 mEq  40 mEq Oral PRN    Or    potassium bicarb-citric acid (EFFER-K) effervescent tablet 40 mEq  40 mEq Oral PRN    Or    potassium chloride 10 mEq/100 mL IVPB (Peripheral Line)  10 mEq IntraVENous PRN    magnesium sulfate 2000 mg in 50 mL IVPB premix  2,000 mg IntraVENous PRN    enoxaparin (LOVENOX) injection 40 mg  40 mg SubCUTAneous Daily    ondansetron (ZOFRAN-ODT) disintegrating tablet 4 mg  4 mg Oral Q8H PRN    Or    ondansetron (ZOFRAN) injection 4 mg  4 mg IntraVENous Q6H PRN    polyethylene glycol (GLYCOLAX) packet 17 g  17 g Oral Daily PRN    acetaminophen (TYLENOL) tablet 650 mg  650 mg Oral Q6H PRN    Or    acetaminophen (TYLENOL) suppository 650 mg  650 mg Rectal Q6H PRN    multivitamin 1 tablet  1 tablet Oral Daily    buPROPion (WELLBUTRIN XL) extended release tablet 150 mg  150 mg Oral Daily    hydrOXYzine HCl (ATARAX) tablet 50 mg  50 mg Oral TID PRN     ______________________________________________________________________  EXPECTED LENGTH OF STAY: 6  ACTUAL LENGTH OF STAY:          2                 Rogue Bussing, MD

## 2023-02-18 ENCOUNTER — Inpatient Hospital Stay: Admit: 2023-02-18 | Payer: PRIVATE HEALTH INSURANCE

## 2023-02-18 ENCOUNTER — Inpatient Hospital Stay: Payer: MEDICAID

## 2023-02-18 LAB — ANA COMPREHENSIVE PANEL
Anti-RNP: <0.2 AI (ref 0.0–0.9)
CENTROMERE PROTEIN B ANTIBODY: <0.2 AI (ref 0.0–0.9)
Chromatin Antibody: <0.2 AI (ref 0.0–0.9)
ENA JO-1 Ab: <0.2 AI (ref 0.0–0.9)
ENA SCL-70 Ab: <0.2 AI (ref 0.0–0.9)
ENA SSA (RO) Ab: <0.2 AI (ref 0.0–0.9)
ENA SSB (LA) Ab: <0.2 AI (ref 0.0–0.9)
ENA Smith (SM) Ab: <0.2 AI (ref 0.0–0.9)
dsDNA Ab: <1 IU/mL (ref 0–9)

## 2023-02-18 LAB — LYME DISEASE TOTAL ANTIBODY WITH REFLEX TO IMMUNOASSAY: Lyme Ab: NEGATIVE

## 2023-02-18 LAB — ALDOLASE: Aldolase: 3.7 U/L (ref 3.3–10.3)

## 2023-02-18 LAB — COPPER, SERUM: Copper: 102 ug/dL (ref 69–132)

## 2023-02-18 MED ORDER — NORMAL SALINE FLUSH 0.9 % IV SOLN
0.9 % | Freq: Two times a day (BID) | INTRAVENOUS | Status: AC
Start: 2023-02-18 — End: 2023-02-23
  Administered 2023-02-19 – 2023-02-23 (×7): 10 mL via INTRAVENOUS

## 2023-02-18 MED ORDER — LORAZEPAM 2 MG/ML IJ SOLN
2 | Freq: Once | INTRAMUSCULAR | Status: AC | PRN
Start: 2023-02-18 — End: 2023-02-18
  Administered 2023-02-18: 21:00:00 1 mg via INTRAVENOUS

## 2023-02-18 MED ORDER — GADOTERIDOL 279.3 MG/ML IV SOLN
279.3 MG/ML | Freq: Once | INTRAVENOUS | Status: AC | PRN
Start: 2023-02-18 — End: 2023-02-23

## 2023-02-18 MED FILL — ENOXAPARIN SODIUM 40 MG/0.4ML IJ SOSY: 40 MG/0.4ML | INTRAMUSCULAR | Qty: 0.4

## 2023-02-18 MED FILL — PROHANCE 279.3 MG/ML IV SOLN: 279.3 MG/ML | INTRAVENOUS | Qty: 15

## 2023-02-18 MED FILL — BUPROPION HCL ER (XL) 150 MG PO TB24: 150 MG | ORAL | Qty: 1

## 2023-02-18 MED FILL — THERA PO TABS: ORAL | Qty: 1

## 2023-02-18 MED FILL — LORAZEPAM 2 MG/ML IJ SOLN: 2 MG/ML | INTRAMUSCULAR | Qty: 1

## 2023-02-18 MED FILL — ACETAMINOPHEN 325 MG PO TABS: 325 MG | ORAL | Qty: 2

## 2023-02-18 MED FILL — NORMAL SALINE FLUSH 0.9 % IV SOLN: 0.9 % | INTRAVENOUS | Qty: 40

## 2023-02-18 NOTE — Care Coordination-Inpatient (Signed)
Transition of Care Plan:    TBD - Behavioral Health Unit likely once stable/if Pt improves from clinical standpoint    Transport: BLS    RUR: 13%  Prior Level of Functioning: Independent  Disposition: TBD - BHU?  If SNF or IPR: Date FOC offered:   Date FOC received:   Accepting facility:   Date authorization started with reference number:   Date authorization received and expires:   Follow up appointments: PCP, Psych, Neuro  DME needed: TBD  Transportation at discharge: TBD  Caregiver Contact: Spouse, Jarin Hallquist [phone]  Discharge Caregiver contacted prior to discharge?   Care Conference needed? TBD  Barriers to discharge: Medical, Neuro/MRI pending. Psych folllowing. PT/OT on board - Pt not participating currently with therapy.    Pt contact info (spouse) update on chart. Pt was transferred from [facility], not able to transfer to hospital in [place] as Pt was under TDO in [facility]). Emergency Medical treatment in a different state  is typically covered under Medicaid, however Pt is [place] Medicaid insured and would not be eligible for any VA rehab services.     Pt Max x2 assist with therapy yesterday, likely not able to admit to [facility] unless Pt improves. MRI is pending. CM will follow.    Lucina Mellow) Barbette Merino, M.S.W.

## 2023-02-18 NOTE — Progress Notes (Signed)
[name]                                                                                          Hospitalist Progress Note  Rogue Bussing, MD  Office Phone: [phone]        Date of Service:  02/18/2023  NAME:  Lucas Hicks  DOB:  03-23-1981  MRN:  [id number]       Admission Summary:   42 y.o man with a history of CHF, HTN, polysubstance abuse including heroin and cocaine, ?schizophrenia, who was admitted to an outside behavioral health unit on May 1 for bizarre behavior and paranoia. He was transferred here for medical work-up due to lack of improvement at [facility].       Interval history / Subjective:   He is a poor historian and unable to provide any history. More lethargic today.      Assessment & Plan:     AMS/psychosis  -etiology remains unclear  -awaiting MRI brain, C and T spine w wo contrast  -EEG  without seizure activity but noted severe encephalopathy  -neurology and psychiatry consultation    R index finger cellulitis w/ abscess:  -complete outpt abx    Polysubstance abuse    Chronic HCV          Code status: full  Prophylaxis: sc Lovenox  Care Plan discussed with: rn/cm  Anticipated Disposition:   Inpatient  Cardiac monitoring: Telemetry  Central Line:            Social Determinants of Health     Tobacco Use: Low Risk  (02/06/2023)    Patient History     Smoking Tobacco Use: Never     Smokeless Tobacco Use: Never     Passive Exposure: Not on file   Alcohol Use: Not At Risk (02/05/2023)    AUDIT-C     Frequency of Alcohol Consumption: Never     Average Number of Drinks: Patient does not drink     Frequency of Binge Drinking: Never   Financial Resource Strain: Not on file   Food Insecurity: Patient Unable To Answer (02/05/2023)    Hunger Vital Sign     Worried About Running Out of Food in the Last Year: Patient unable to answer     Ran Out of Food in the Last Year: Patient unable to answer   Transportation Needs: Patient Unable To Answer (02/05/2023)    PRAPARE -  Transportation     Lack of Transportation (Medical): Patient unable to answer     Lack of Transportation (Non-Medical): Patient unable to answer   Physical Activity: Not on file   Stress: Not on file   Social Connections: Not on file   Intimate Partner Violence: Not on file   Depression: Not at risk (02/05/2023)    PHQ-2     PHQ-2 Score: 0   Housing Stability: Patient Unable To Answer (02/05/2023)    Housing Stability Vital Sign     Unable to Pay for Housing in the Last Year: Patient unable to answer     Number of Places Lived  in the Last Year: 0     Unstable Housing in the Last Year: Patient unable to answer   Interpersonal Safety: Patient Unable To Answer (02/05/2023)    Interpersonal Safety Domain Source: IP Abuse Screening     Physical abuse: Unable to assess     Verbal abuse: Unable to assess     Emotional abuse: Unable to assess      Financial abuse: Unable to assess      Sexual abuse: Unable to assess    Utilities: Patient Unable To Answer (02/05/2023)    [facility] Utilities     Threatened with loss of utilities: Patient unable to answer       Review of Systems:   Review of systems not obtained due to patient factors.       Vital Signs:    Last 24hrs VS reviewed since prior progress note. Most recent are:  Vitals:    02/18/23 1200   BP:    Pulse: 92   Resp:    Temp:    SpO2:          Intake/Output Summary (Last 24 hours) at 02/18/2023 1322  Last data filed at 02/18/2023 1308  Gross per 24 hour   Intake --   Output 575 ml   Net -575 ml          Physical Examination:     I had a face to face encounter with this patient and independently examined them on 02/18/2023 as outlined below:          General : NAD, lethargic  HEENT: anicteric sclerae  Neck: supple, no JVD, no meningeal signs  Chest: Clear to auscultation bilaterally   CVS: S1 S2 heard, Capillary refill less than 2 seconds  Abd: soft/ non tender, non distended, BS physiological,   Ext: no clubbing, no cyanosis, no edema  Neuro/Psych: lethargic unable to participate in  exam  Skin: warm     Data Review:    Review and/or order of clinical lab test  Review and/or order of tests in the radiology section of CPT  Review and/or order of tests in the medicine section of CPT      I have personally and independently reviewed all pertinent labs, diagnostic studies, imaging, and have provided independent interpretation of the same.     Labs:     Recent Labs     02/16/23  0214   WBC 7.8   HGB 13.7   HCT 43.3   PLT 264       Recent Labs     02/16/23  0214   NA 138   K 4.8   CL 105   CO2 29   BUN 19       Recent Labs     02/16/23  0214   ALT 99*   GLOB 4.3*       No results for input(s): "INR", "APTT" in the last 72 hours.    Invalid input(s): "PTP"   No results for input(s): "TIBC", "FERR" in the last 72 hours.    Invalid input(s): "FE", "PSAT"   No results found for: "RBCF"   No results for input(s): "PH", "PCO2", "PO2" in the last 72 hours.  No results for input(s): "CPK" in the last 72 hours.    Invalid input(s): "CPKMB", "CKNDX", "TROIQ"  Lab Results   Component Value Date/Time    CHOL 143 02/06/2023 11:40 AM    HDL 38 02/06/2023 11:40 AM    LDL 88.4 02/06/2023  11:40 AM     No results found for: "GLUCPOC"  @LABUA @    Notes reviewed from all clinical/nonclinical/nursing services involved in patient's clinical care. Care coordination discussions were held with appropriate clinical/nonclinical/ nursing providers based on care coordination needs.         Patients current active Medications were reviewed, considered, added and adjusted based on the clinical condition today.      Home Medications were reconciled to the best of my ability given all available resources at the time of admission. Route is PO if not otherwise noted.      Admission Status:[id number]:::1}      Medications Reviewed:     Current Facility-Administered Medications   Medication Dose Route Frequency    LORazepam (ATIVAN) injection 1 mg  1 mg IntraVENous Once PRN    0.9 % sodium chloride infusion   IntraVENous Continuous     sodium chloride flush 0.9 % injection 5-40 mL  5-40 mL IntraVENous 2 times per day    sodium chloride flush 0.9 % injection 5-40 mL  5-40 mL IntraVENous PRN    0.9 % sodium chloride infusion   IntraVENous PRN    potassium chloride (KLOR-CON) extended release tablet 40 mEq  40 mEq Oral PRN    Or    potassium bicarb-citric acid (EFFER-K) effervescent tablet 40 mEq  40 mEq Oral PRN    Or    potassium chloride 10 mEq/100 mL IVPB (Peripheral Line)  10 mEq IntraVENous PRN    magnesium sulfate 2000 mg in 50 mL IVPB premix  2,000 mg IntraVENous PRN    enoxaparin (LOVENOX) injection 40 mg  40 mg SubCUTAneous Daily    ondansetron (ZOFRAN-ODT) disintegrating tablet 4 mg  4 mg Oral Q8H PRN    Or    ondansetron (ZOFRAN) injection 4 mg  4 mg IntraVENous Q6H PRN    polyethylene glycol (GLYCOLAX) packet 17 g  17 g Oral Daily PRN    acetaminophen (TYLENOL) tablet 650 mg  650 mg Oral Q6H PRN    Or    acetaminophen (TYLENOL) suppository 650 mg  650 mg Rectal Q6H PRN    multivitamin 1 tablet  1 tablet Oral Daily    buPROPion (WELLBUTRIN XL) extended release tablet 150 mg  150 mg Oral Daily    hydrOXYzine HCl (ATARAX) tablet 50 mg  50 mg Oral TID PRN     ______________________________________________________________________  EXPECTED LENGTH OF STAY: 6  ACTUAL LENGTH OF STAY:          3                 Rogue Bussing, MD

## 2023-02-18 NOTE — Other (Signed)
[facility] Liaison Team Note     LOS:  3     Patient goal(s) for today: take medications as prescribed, make needs known in an appropriate manner.  [facility] Liaison team focus/goals: assess MH needs, provide therapeutic support, brief therapy, and education, as needed.  Assist medical care management team with recommendations for coordination of care.    Psychiatric Consult: psychosis.  Daily consult      Progress note: Pt was transferred to [facility] for a medical workup, from [facility], where he was admitted as a TDO on 02/05/23.  Chart review from 02/05/23, indicates pt was taken to [facility] by EMS after being found in the middle of the street taking his clothes off - an ECO was issued due to capacity concerns: UDS (+) cocaine, benzos. Pt was subsequently admitted to [facility].       Liaison met with pt, FTF, on the medical floor.  He was received lying in bed, staring down toward his left leg with flat affect, mouth open, and unresponsive.  This writer attempted to engage, several times, by calling pt's name, requesting eye contact, moving to pt's line of sight on left side of bed, however, pt did not respond.  The only movement observed was pt sliding his left foot repeatedly, backward and forward on the mattress.  Pt did not appear to be in any acute distress.  Liaison team will continue to monitor and support, as needed.       Barriers to Discharge: medical clearance      Outpatient provider(s):  none reported  Insurance info/prescription coverage:  Medicaid of [place]     Diagnosis: Per [name], NP, "Polysubstance abuse disorder, ?  Underlying schizophrenia?. Delirium secondary to either medical or mental/withdrawal underlying origin"      Dx From [facility]:   Major depressive disorder by history  Generalized anxiety disorder by history  Stimulant/cocaine use disorder severity unspecified  History of opiate use disorder  Rule out sedative-hypnotic use disorder  Rule out delirium      Plan:  Return to [facility] when medically cleared.  Please refer to most recent psychiatric consult note and medical team for recommendations and disposition.      Follow up Psych Consult placed? Yes   Psychiatrist updated? No      Participating treatment team members: Mckaden Delon, [name], LCSW    [name] ([address]) [place], [place]  [facility] Liaison  Available on [facility]

## 2023-02-18 NOTE — Progress Notes (Addendum)
Pt. Unresponsive but responds to pain. Notified NP of pt's elevated HR and tremors. VS stable besides HR. Rapid response nurse notified to obtain Lactic acid on patient, results WNL.Will continue to monitor pt.      2145: Repeat temp 98.6 AX, HR 104. Tremors have decreased. Will continue to monitor

## 2023-02-18 NOTE — Progress Notes (Signed)
Less responsive/ held food, able to swallow small pill.  Slight cough on drinking water.    Al-Sadoon notified.  Changed diet to easy to chew.        Ativan ordered if needed for MRI, esimated to take 1 hour.  No tele needs per MD>

## 2023-02-18 NOTE — Consults (Signed)
[facility]  PSYCHIATRY CONSULT NOTE:    Name: Lucas Hicks  Lucas#: [id number]  DOB: 1981-07-26  ACCOUNT#: [id number]  ADMIT DATE: 02/15/2023    REASON FOR CONSULT: AMS/psychosis    Information obtained from: Chart, Wife, RN    INTERVAL UPDATE: 02/18/2023  Lucas Hicks is seen today while on his bed on [facility]. His mentation appears to be continuing to decline. He does track some but unable to speak or formulate words. He does appear to weakly attempt to follow command when asked to squeeze my hand with his left and right hand with more. Still awaiting MRI for further data to offer etiology of many possibilities that could be causing the severe encephalopathy as noted from EEG results.     INTERVAL UPDATE: 02/17/2023  Lucas Hicks is seen today while lying in bed. He is mute today not answering my questions but he will nod or shake his head in efforts to answer my questions and does so appropriately. He is mostly staring straight ahead but will occasionally make eye contact with me. His nurse informed me that his wife had jkust left and she went to try and get her for me but was unable to catch up to her. He did have an EEG today and will soon be having an MRI which hopefully data from both/either can add some additional information regarding his present waxy and wane mental presentation. No knowledge if ever dx with substance induced chronic memory loss/dementia. He is only 42 y/o but his wife does also share this deterioration has been going on for "some time". Spoke with her last evening for more than 30 minutes. Have restarted his Wellbutrin which she had said was helpful previously and will await for results from testing today to further assess next steps. Hi is able to shake his head and denies and SI, HI, A/VH.       HISTORY OF PRESENTING COMPLAINT:  Lucas Hicks is a 42 y.o. male with PMH of polysubstance abuse, (heroin and cocaine) CHF, hypertension, ?schizophrenia dx questioned,  He is currently seen on  neuroscience telemetry while sitting all on his bed .  He is mostly appropriate with me today he is easily distracted but able to be reoriented.  Denies any thoughts of suicide, homicide, he does have some audio or visual hallucinations from time to time did not denies any now presently.  He is unsure if they are command in nature or just sounds at this time cannot describe.  Recently had been admitted to behavioral health unit and decompensated physically brought here for treatment. Denies any new psychiatric symptoms. He has a bizarre affect and sitting is odd fashion across the bed when I entered and easily distracted. Other than these present symptoms, no new psychiatric symptoms reported.     PAST PSYCHIATRIC HISTORY: Schizophrenia?  Major depressive disorder    SUBSTANCE ABUSE HISTORY: Heroin and cocaine addiction    PSYCHOSOCIAL HISTORY: Lives with wife who is supportive    MENTAL STATUS EXAM:   Lucas Hicks is a 42 y.o. White (non-Hispanic) male who appears his stated age. He is largely cooperative with assessment questions. He makes fair eye contact.  No psychomotor agitation/retardation is observed. His speech is slow in rate, low tone, and volume. His self-reported mood is "OK". His affect is blunt. He denies auditory and visual hallucinations. No paranoia or delusions are elicited with assessment but informed by RN he has delusional thinking or fears about police officers. His thought processes are  disorganized. He denies suicidal and homicidal ideation. [name] X 3. His recent and remote memory is impaired Insight and judgment are limited/poor.    DIAGNOSTIC IMPRESSION: Polysubstance abuse disorder, ?  Underlying schizophrenia?. Delirium secondary to either medical or mental/withdrawal underlying origin      ASSESSMENT/PLAN:     Update: 02/18/2023  No additional medications or changes presently will await MRI results once obtained for further data to direct treatment care decisions       UPDATE: 02/17/2023  No changes from previous recommendations on 02/16/2023; will await results from EEG and MRI to assess for meds or psychiatric care based on gained information if relevant     Continue with altered mental status/delirium prophylaxis care to include reorientation ensuring he has proper nutrition and hydration and other daily needs.    Make sure all medical acute ailments are addressed and treated.    I am evaluating medications and will likely start on Wellbutrin and then Lamictal which have worked well together previously (Wellbutrin was stopped in prison due to cost)    Hydroxyzine as needed for anxiety    Psychiatry will gladly follow during his stay for any recommended medication management    Thank you for the opportunity to participate in the care of your patient. Please re-consult psychiatry as needed.

## 2023-02-18 NOTE — Progress Notes (Signed)
Rapid Response Team    The Rapid Response RN was requested to assess the patient in Room 657 due to Tachycardia.      Upon Arrival the Primary RN advised that the patient is minimally responsive and tachycardic    On exam the patient has eyes closed and no response to noxious stimulus.  Upper extremities contracted to chest and rigors noted.  Skin warm and flushed with sweating present.  Pt actively squeezes eyes closed when RN attempts to open his eyelids to assess pupils.  VS assessed significant for sinus tachycardia in the 150s.  Order received for POCT Lactic Acid which was completed.  Additional IV access obtained, 20G Left forearm.      POC Lactic Acid   Date Value Ref Range Status   02/18/2023 1.31 0.40 - 2.00 mmol/L Final     Patient discussed with Hospitalist NP and orders received for labs which were obtained and sent for analysis.      The patient was left in the care of their primary nursing team.    Checked in with primary RN prior to leaving. Opportunity for questions and concerns provided.    DI at the time of Rapid Response Consult: 64

## 2023-02-19 LAB — COMPREHENSIVE METABOLIC PANEL W/ REFLEX TO MG FOR LOW K
ALT: 83 U/L — ABNORMAL HIGH (ref 12–78)
AST: 31 U/L (ref 15–37)
Albumin/Globulin Ratio: 0.8 — ABNORMAL LOW (ref 1.1–2.2)
Albumin: 3.1 g/dL — ABNORMAL LOW (ref 3.5–5.0)
Alk Phosphatase: 74 U/L (ref 45–117)
Anion Gap: 8 mmol/L (ref 5–15)
BUN/Creatinine Ratio: 19 (ref 12–20)
BUN: 15 MG/DL (ref 6–20)
CO2: 23 mmol/L (ref 21–32)
Calcium: 9.1 MG/DL (ref 8.5–10.1)
Chloride: 106 mmol/L (ref 97–108)
Creatinine: 0.8 MG/DL (ref 0.70–1.30)
Est, Glom Filt Rate: >90 mL/min/{1.73_m2} (ref >60)
Globulin: 3.7 g/dL (ref 2.0–4.0)
Glucose: 105 mg/dL — ABNORMAL HIGH (ref 65–100)
Potassium: 3.2 mmol/L — ABNORMAL LOW (ref 3.5–5.1)
Sodium: 137 mmol/L (ref 136–145)
Total Bilirubin: 0.4 MG/DL (ref 0.2–1.0)
Total Protein: 6.8 g/dL (ref 6.4–8.2)

## 2023-02-19 LAB — PHOSPHORUS: Phosphorus: 2.9 MG/DL (ref 2.6–4.7)

## 2023-02-19 LAB — POC LACTIC ACID: POC Lactic Acid: 1.31 mmol/L (ref 0.40–2.00)

## 2023-02-19 LAB — CBC WITH AUTO DIFFERENTIAL
Basophils %: 0 % (ref 0–1)
Basophils %: 0 % (ref 0–1)
Basophils Absolute: 0 10*3/uL (ref 0.0–0.1)
Basophils Absolute: 0 10*3/uL (ref 0.0–0.1)
Eosinophils %: 0 % (ref 0–7)
Eosinophils %: 1 % (ref 0–7)
Eosinophils Absolute: 0 10*3/uL (ref 0.0–0.4)
Eosinophils Absolute: 0 10*3/uL (ref 0.0–0.4)
Hematocrit: 36.2 % — ABNORMAL LOW (ref 36.6–50.3)
Hematocrit: 34.2 % — ABNORMAL LOW (ref 36.6–50.3)
Hemoglobin: 12 g/dL — ABNORMAL LOW (ref 12.1–17.0)
Hemoglobin: 11.6 g/dL — ABNORMAL LOW (ref 12.1–17.0)
Immature Granulocytes %: 0 % (ref 0.0–0.5)
Immature Granulocytes %: 0 % (ref 0.0–0.5)
Immature Granulocytes Absolute: 0 10*3/uL (ref 0.00–0.04)
Immature Granulocytes Absolute: 0 10*3/uL (ref 0.00–0.04)
Lymphocytes %: 10 % — ABNORMAL LOW (ref 12–49)
Lymphocytes %: 13 % (ref 12–49)
Lymphocytes Absolute: 1 10*3/uL (ref 0.8–3.5)
Lymphocytes Absolute: 1 10*3/uL (ref 0.8–3.5)
MCH: 29.1 PG (ref 26.0–34.0)
MCH: 29.7 PG (ref 26.0–34.0)
MCHC: 33.1 g/dL (ref 30.0–36.5)
MCHC: 33.9 g/dL (ref 30.0–36.5)
MCV: 87.9 FL (ref 80.0–99.0)
MCV: 87.7 FL (ref 80.0–99.0)
MPV: 10 FL (ref 8.9–12.9)
MPV: 9.7 FL (ref 8.9–12.9)
Monocytes %: 9 % (ref 5–13)
Monocytes %: 12 % (ref 5–13)
Monocytes Absolute: 0.9 10*3/uL (ref 0.0–1.0)
Monocytes Absolute: 1 10*3/uL (ref 0.0–1.0)
Neutrophils %: 81 % — ABNORMAL HIGH (ref 32–75)
Neutrophils %: 74 % (ref 32–75)
Neutrophils Absolute: 7.8 10*3/uL (ref 1.8–8.0)
Neutrophils Absolute: 5.7 10*3/uL (ref 1.8–8.0)
Nucleated RBCs: 0 PER 100 WBC
Nucleated RBCs: 0 PER 100 WBC
Platelets: 218 10*3/uL (ref 150–400)
Platelets: 193 10*3/uL (ref 150–400)
RBC: 4.12 M/uL (ref 4.10–5.70)
RBC: 3.9 M/uL — ABNORMAL LOW (ref 4.10–5.70)
RDW: 13.9 % (ref 11.5–14.5)
RDW: 14.2 % (ref 11.5–14.5)
WBC: 9.7 10*3/uL (ref 4.1–11.1)
WBC: 7.8 10*3/uL (ref 4.1–11.1)
nRBC: 0 10*3/uL (ref 0.00–0.01)
nRBC: 0 10*3/uL (ref 0.00–0.01)

## 2023-02-19 LAB — BASIC METABOLIC PANEL
Anion Gap: 5 mmol/L (ref 5–15)
BUN/Creatinine Ratio: 19 (ref 12–20)
BUN: 14 MG/DL (ref 6–20)
CO2: 23 mmol/L (ref 21–32)
Calcium: 9 MG/DL (ref 8.5–10.1)
Chloride: 110 mmol/L — ABNORMAL HIGH (ref 97–108)
Creatinine: 0.73 MG/DL (ref 0.70–1.30)
Est, Glom Filt Rate: >90 mL/min/{1.73_m2} (ref >60)
Glucose: 94 mg/dL (ref 65–100)
Potassium: 3.5 mmol/L (ref 3.5–5.1)
Sodium: 138 mmol/L (ref 136–145)

## 2023-02-19 LAB — PROCALCITONIN: Procalcitonin: 0.07 ng/mL

## 2023-02-19 LAB — VITAMIN B1, WHOLE BLOOD: Vitamin B1,Whole Blood: 97.6 nmol/L (ref 66.5–200.0)

## 2023-02-19 LAB — AMMONIA: Ammonia: 20 umol/L (ref <32)

## 2023-02-19 LAB — ZINC, WHOLE BLOOD: Zinc: 590 ug/dL (ref 440–860)

## 2023-02-19 LAB — MAGNESIUM: Magnesium: 1.9 mg/dL (ref 1.6–2.4)

## 2023-02-19 MED ORDER — BACLOFEN 10 MG PO TABS
10 | Freq: Three times a day (TID) | ORAL | Status: DC | PRN
Start: 2023-02-19 — End: 2023-02-23

## 2023-02-19 MED FILL — BUPROPION HCL ER (XL) 150 MG PO TB24: 150 MG | ORAL | Qty: 1

## 2023-02-19 MED FILL — THERA PO TABS: ORAL | Qty: 1

## 2023-02-19 MED FILL — ENOXAPARIN SODIUM 40 MG/0.4ML IJ SOSY: 40 MG/0.4ML | INTRAMUSCULAR | Qty: 0.4

## 2023-02-19 MED FILL — ACETAMINOPHEN 325 MG PO TABS: 325 MG | ORAL | Qty: 2

## 2023-02-19 NOTE — Progress Notes (Signed)
Neurology Consult Note     NAME: Lucas Hicks   DOB:  23-Nov-1980   MRN:  [id number]   DATE:  02/19/2023    Assessment and Plan:     42 yo M h/o CHF, HTN, polysubstance abuse (heroin and crack cocaine), cocaine induced paranoia and hallucinations, recent schizophrenia diagnosis, prior intentional overdose presenting with AMS. 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 Per chart review, patient noted to have progressive decline in responsiveness and also noted to have decline in functionality including walking. BH notes also mentioned difficulty with extending limbs and "rigidity". CTH 5/2 showed no acute findings. Labs have been so far unrevealing. Transferred to [facility] for neurologic evaluation. Exam notable for minimal verbal output, somnolence, and increased tone in all four extremities. Etiology unclear but notable spasticity in extremities suggestive of organic lesion in the CNS; however, differential still includes catatonia/other psychiatric disorders. Low suspicion for seizures as cause of symptoms.    5/13:  No major changes overnight. Exam still shows increased tone throughout but notably more in LUE compared to RUE, BLE tone possibly mildly increased. MRI still pending. Updated wife at bedside.    5/15:  MRI brain completed overnight, shows extensive T2/FLAIR hyperintensity of bilateral cerebral white matter with mild restricted diffusion of the same. This is consistent with known history of heroin and cocaine abuse as either drug could result in toxic metabolic leukoencephalopathy. No h/o prolonged hypoxia or cardiac arrest. No other history to suggest other toxic metabolic causes of MRI pattern. Prognosis is poor, expect that patient will never return to baseline and will need extensive  care for the rest of his life; however, vitamin E, vitamin C, and CoQ10 have been used in some cases to aid in recovery. Steroids, IVIG, and PLEX have also been attempted but there is no evidence that these provide any significant benefit. Discussed diagnosis and prognosis with wife via phone. She verbalized understanding. She would like him to be transferred back to [place] if possible. MRI C spine showed no acute changes. MRI T spine not tolerated.    - MRI results as above  - Routine EEG: severe encephalopathy  - Follow up nutritional labs  - Start baclofen 2.5 mg PRN for spasticity, can titrate as needed but may cause drowsiness  - Start multivitamin  - On discharge, start CoQ10 300 mg daily (not on formulary)  - Supportive care    We will sign off at this time.    Subjective   Interval events:  See assessment.    HPI:  Lucas Hicks is a 42 y.o. male who presents with AMS (altered mental status). Neurology was consulted for AMS. History obtained per primary team and chart review. Patient was not able to provide any history.    [facility] presents due to AMS. In early April, treated for drug overdose and PNA requiring intubation. Discharged 4/13 to halfway house but did not stay and continued using drugs on the street. On 4/25, presented to ED for R finger abscess. Reportedly having paranoia and hallucinations during that admission attributed to cocaine use, prompting Zyprexa prescription. Discharged 4/28. Admitted to behavioral health on 5/1 after being found in the middle of the street taking off his clothes. Reportedly A&Ox3 and walking with steady gait at that time but noted to be "confused", though same day notes document that he was responding to internal stimuli and A&Ox1 only. Noted to be incontinent of urine. Mild transaminitis, ALT  predominant. Mental status continued to deteriorate. Patient noted to have "rigid" extremities on 5/4, unable to sit up or stand on his own. Cognition and appetite seemed to be  improving mildly  over next few days then worsened again 5/7, progressively more sleepy, alert but responding minimally to verbal stimuli. Progressively less responsive but still eating, drinking, and taking medications with nursing assistance. Notably on 5/10 pm, patient was reportedly "very talkative" and "using upper torso to dancing with staff." Tele-neuro reportedly evaluated patient on 5/11 and recommended MRI and EEG. Yesterday, patient reportedly "able to answer most answer appropriately". Arrived at [facility] on 5/12 am.     Pertinent workup: CTH on 5/2 that showed no acute findings, CBC, CMP within normal limits, ESR 18, CRP negative.    ROS:  A comprehensive ROS was negative except as noted in the HPI above.    PMH:  Past Medical History:   Diagnosis Date    Abscess     Right  index finger, per records pt undergone   incision & drainage  and was discharged home on 28th with prescription for Ceftin, Per [facility] ED rn verball report pton Ceftin 500mg  BID    Asthma     CHF (congestive heart failure) (HCC)     Chronic depression     Scar     surgical scar on Right knee area    Schizophrenia (HCC)     per prescreen pt has hx of schizophrenia diagnosis    Tattoos     tattoos distributed on upper back, Right nec, left head behind ear, left forarm, Right forarm, Right arm circular tattoe       MEDS:  HOME MEDS:  Prior to Admission Medications   Prescriptions Last Dose Informant Patient Reported? Taking?   OLANZapine zydis (ZYPREXA) 10 MG disintegrating tablet  Outside Pharmacy/PCP Yes No   Sig: Take 1 tablet by mouth nightly   albuterol sulfate HFA (VENTOLIN HFA) 108 (90 Base) MCG/ACT inhaler   Yes No   Sig: Inhale 2 puffs into the lungs every 6 hours as needed for Wheezing   alfuzosin (UROXATRAL) 10 MG extended release tablet  Outside Pharmacy/PCP Yes No   Sig: Take 1 tablet by mouth daily   lamoTRIgine (LAMICTAL) 200 MG tablet  Outside Pharmacy/PCP Yes No   Sig: Take 1 tablet by mouth at bedtime       Facility-Administered Medications: None         CURRENT MEDS:    Current Facility-Administered Medications:     baclofen (LIORESAL) tablet 2.5 mg, 2.5 mg, Oral, TID PRN, Lucas Corns, MD    gadoteridol [facility]) injection 14 mL, 14 mL, IntraVENous, ONCE PRN, Ngwafang, Bleck B, MD    sodium chloride flush 0.9 % injection 5-40 mL, 5-40 mL, IntraVENous, BID, Ngwafang, Bleck B, MD, 10 mL at 02/19/23 0944    0.9 % sodium chloride infusion, , IntraVENous, Continuous, Al-Saadoon, Ovidio Hanger, MD, Last Rate: 100 mL/hr at 02/19/23 0521, New Bag at 02/19/23 0521    sodium chloride flush 0.9 % injection 5-40 mL, 5-40 mL, IntraVENous, 2 times per day, Ngwafang, Bleck B, MD, 10 mL at 02/19/23 0945    sodium chloride flush 0.9 % injection 5-40 mL, 5-40 mL, IntraVENous, PRN, Ngwafang, Bleck B, MD    0.9 % sodium chloride infusion, , IntraVENous, PRN, Ngwafang, Bleck B, MD    potassium chloride (KLOR-CON) extended release tablet 40 mEq, 40 mEq, Oral, PRN **OR** potassium bicarb-citric acid (EFFER-K) effervescent tablet 40 mEq,  40 mEq, Oral, PRN **OR** potassium chloride 10 mEq/100 mL IVPB (Peripheral Line), 10 mEq, IntraVENous, PRN, Ngwafang, Bleck B, MD    magnesium sulfate 2000 mg in 50 mL IVPB premix, 2,000 mg, IntraVENous, PRN, Ngwafang, Bleck B, MD    enoxaparin (LOVENOX) injection 40 mg, 40 mg, SubCUTAneous, Daily, Ngwafang, Bleck B, MD, 40 mg at 02/19/23 0945    ondansetron (ZOFRAN-ODT) disintegrating tablet 4 mg, 4 mg, Oral, Q8H PRN **OR** ondansetron (ZOFRAN) injection 4 mg, 4 mg, IntraVENous, Q6H PRN, Ngwafang, Bleck B, MD    polyethylene glycol (GLYCOLAX) packet 17 g, 17 g, Oral, Daily PRN, Ngwafang, Bleck B, MD    acetaminophen (TYLENOL) tablet 650 mg, 650 mg, Oral, Q6H PRN, 650 mg at 02/18/23 2038 **OR** acetaminophen (TYLENOL) suppository 650 mg, 650 mg, Rectal, Q6H PRN, Ngwafang, Bleck B, MD    multivitamin 1 tablet, 1 tablet, Oral, Daily, Lucas Corns, MD, 1 tablet at 02/19/23 0944    buPROPion (WELLBUTRIN XL)  extended release tablet 150 mg, 150 mg, Oral, Daily, [name], APRN - CNP, 150 mg at 02/19/23 0944    hydrOXYzine HCl (ATARAX) tablet 50 mg, 50 mg, Oral, TID PRN, [name], APRN - CNP      Allergies   Allergen Reactions    Amoxicillin     Fish Allergy Hives    Penicillin G Hives    Prednisone Other (See Comments)     irritable         SH:  Social History     Socioeconomic History    Marital status: Single     Spouse name: Not on file    Number of children: Not on file    Years of education: Not on file    Highest education level: Not on file   Occupational History    Not on file   Tobacco Use    Smoking status: Never    Smokeless tobacco: Never    Tobacco comments:     Pt non smoker   Vaping Use    Vaping Use: Unknown   Substance and Sexual Activity    Alcohol use: Never    Drug use: Yes     Types: Cocaine     Comment: pt denies drug use but wa spostive for Benzo and cocaine in his drug screen at ED    Sexual activity: Yes     Partners: Female   Other Topics Concern    Not on file   Social History Narrative    Not on file     Social Determinants of Health     Financial Resource Strain: Not on file   Food Insecurity: Patient Unable To Answer (02/05/2023)    Hunger Vital Sign     Worried About Running Out of Food in the Last Year: Patient unable to answer     Ran Out of Food in the Last Year: Patient unable to answer   Transportation Needs: Patient Unable To Answer (02/05/2023)    PRAPARE - Transportation     Lack of Transportation (Medical): Patient unable to answer     Lack of Transportation (Non-Medical): Patient unable to answer   Physical Activity: Not on file   Stress: Not on file   Social Connections: Not on file   Intimate Partner Violence: Not on file   Housing Stability: Patient Unable To Answer (02/05/2023)    Housing Stability Vital Sign     Unable to Pay for Housing in the Last Year: Patient unable to answer  Number of Places Lived in the Last Year: 0     Unstable Housing in the Last Year:  Patient unable to answer           FH:  Family History   Family history unknown: Yes           Objective:     BP (!) 146/101   Pulse 93   Temp 98.4 F (36.9 C) (Oral)   Resp 17   Wt 70.3 kg (154 lb 15.7 oz)   SpO2 96%   BMI 23.57 kg/m   Temp (24hrs), Avg:99.2 F (37.3 C), Min:98.2 F (36.8 C), Max:101.1 F (38.4 C)      Physical Exam:  General: Ill appearing patient in no apparent distress.   Pulmonary: No increased WOB  Cardiac: Regular rate and rhythm  Extremities: No cyanosis or edema    Neurological Exam:  Mental Status: Regards, occasionally shakes head no to questions, otherwise no verbal output. Does not follow commands.   Cranial Nerves:   EOM grossly intact but at least mildly dysconjugate. No appreciable significant facial droop.    Motor:  Did not follow commands in any extremities.    Reflexes:   DTRs 2+ RUE, 1+ LUE, 1+ BLE patellars and absent achilles, but limited by positioning. Toes appear to be mute but patient repeatedly withdrawing with testing, limiting evaluation. No clonus.   Sensory:   Grossly intact   Gait:     Cerebellar:  UTA         STUDIES AND REPORTS:  Recent Results (from the past 24 hour(s))   POC Lactic Acid    Collection Time: 02/18/23  8:44 PM   Result Value Ref Range    POC Lactic Acid 1.31 0.40 - 2.00 mmol/L   Comprehensive Metabolic Panel w/ Reflex to MG    Collection Time: 02/18/23  9:08 PM   Result Value Ref Range    Sodium 137 136 - 145 mmol/L    Potassium 3.2 (L) 3.5 - 5.1 mmol/L    Chloride 106 97 - 108 mmol/L    CO2 23 21 - 32 mmol/L    Anion Gap 8 5 - 15 mmol/L    Glucose 105 (H) 65 - 100 mg/dL    BUN 15 6 - 20 MG/DL    Creatinine 1.61 0.96 - 1.30 MG/DL    BUN/Creatinine Ratio 19 12 - 20      Est, Glom Filt Rate >90 >60 ml/min/1.41m2    Calcium 9.1 8.5 - 10.1 MG/DL    Total Bilirubin 0.4 0.2 - 1.0 MG/DL    ALT 83 (H) 12 - 78 U/L    AST 31 15 - 37 U/L    Alk Phosphatase 74 45 - 117 U/L    Total Protein 6.8 6.4 - 8.2 g/dL    Albumin 3.1 (L) 3.5 - 5.0 g/dL     Globulin 3.7 2.0 - 4.0 g/dL    Albumin/Globulin Ratio 0.8 (L) 1.1 - 2.2     CBC with Auto Differential    Collection Time: 02/18/23  9:08 PM   Result Value Ref Range    WBC 9.7 4.1 - 11.1 K/uL    RBC 4.12 4.10 - 5.70 M/uL    Hemoglobin 12.0 (L) 12.1 - 17.0 g/dL    Hematocrit 04.5 (L) 36.6 - 50.3 %    MCV 87.9 80.0 - 99.0 FL    MCH 29.1 26.0 - 34.0 PG    MCHC 33.1 30.0 - 36.5 g/dL  RDW 13.9 11.5 - 14.5 %    Platelets 218 150 - 400 K/uL    MPV 10.0 8.9 - 12.9 FL    Nucleated RBCs 0.0 0 PER 100 WBC    nRBC 0.00 0.00 - 0.01 K/uL    Neutrophils % 81 (H) 32 - 75 %    Lymphocytes % 10 (L) 12 - 49 %    Monocytes % 9 5 - 13 %    Eosinophils % 0 0 - 7 %    Basophils % 0 0 - 1 %    Immature Granulocytes % 0 0.0 - 0.5 %    Neutrophils Absolute 7.8 1.8 - 8.0 K/UL    Lymphocytes Absolute 1.0 0.8 - 3.5 K/UL    Monocytes Absolute 0.9 0.0 - 1.0 K/UL    Eosinophils Absolute 0.0 0.0 - 0.4 K/UL    Basophils Absolute 0.0 0.0 - 0.1 K/UL    Immature Granulocytes Absolute 0.0 0.00 - 0.04 K/UL    Differential Type AUTOMATED     Procalcitonin    Collection Time: 02/18/23  9:08 PM   Result Value Ref Range    Procalcitonin 0.07 ng/mL   Ammonia    Collection Time: 02/18/23  9:08 PM   Result Value Ref Range    Ammonia 20 <32 UMOL/L   Phosphorus    Collection Time: 02/18/23  9:08 PM   Result Value Ref Range    Phosphorus 2.9 2.6 - 4.7 MG/DL   Magnesium    Collection Time: 02/18/23  9:08 PM   Result Value Ref Range    Magnesium 1.9 1.6 - 2.4 mg/dL   CBC with Auto Differential    Collection Time: 02/19/23  4:06 AM   Result Value Ref Range    WBC 7.8 4.1 - 11.1 K/uL    RBC 3.90 (L) 4.10 - 5.70 M/uL    Hemoglobin 11.6 (L) 12.1 - 17.0 g/dL    Hematocrit 91.4 (L) 36.6 - 50.3 %    MCV 87.7 80.0 - 99.0 FL    MCH 29.7 26.0 - 34.0 PG    MCHC 33.9 30.0 - 36.5 g/dL    RDW 78.2 95.6 - 21.3 %    Platelets 193 150 - 400 K/uL    MPV 9.7 8.9 - 12.9 FL    Nucleated RBCs 0.0 0 PER 100 WBC    nRBC 0.00 0.00 - 0.01 K/uL    Neutrophils % 74 32 - 75 %    Lymphocytes  % 13 12 - 49 %    Monocytes % 12 5 - 13 %    Eosinophils % 1 0 - 7 %    Basophils % 0 0 - 1 %    Immature Granulocytes % 0 0.0 - 0.5 %    Neutrophils Absolute 5.7 1.8 - 8.0 K/UL    Lymphocytes Absolute 1.0 0.8 - 3.5 K/UL    Monocytes Absolute 1.0 0.0 - 1.0 K/UL    Eosinophils Absolute 0.0 0.0 - 0.4 K/UL    Basophils Absolute 0.0 0.0 - 0.1 K/UL    Immature Granulocytes Absolute 0.0 0.00 - 0.04 K/UL    Differential Type AUTOMATED     Basic Metabolic Panel    Collection Time: 02/19/23  4:06 AM   Result Value Ref Range    Sodium 138 136 - 145 mmol/L    Potassium 3.5 3.5 - 5.1 mmol/L    Chloride 110 (H) 97 - 108 mmol/L    CO2 23 21 - 32 mmol/L  Anion Gap 5 5 - 15 mmol/L    Glucose 94 65 - 100 mg/dL    BUN 14 6 - 20 MG/DL    Creatinine 1.61 0.96 - 1.30 MG/DL    BUN/Creatinine Ratio 19 12 - 20      Est, Glom Filt Rate >90 >60 ml/min/1.49m2    Calcium 9.0 8.5 - 10.1 MG/DL         IMAGING: Personally reviewed.         Signed:Theopolis Sloop Robynn Pane, MD

## 2023-02-19 NOTE — Plan of Care (Addendum)
Bedside and Verbal shift change report given to [place], RN/ (Zoe, Charity fundraiser) (Cabin crew) by Shanda Bumps, RN (offgoing nurse). Report included the following information SBAR, Kardex, ED Summary, MAR, and Cardiac Rhythm [id number].     Problem: Discharge Planning  Goal: Discharge to home or other facility with appropriate resources  Outcome: Progressing  Flowsheets (Taken 02/19/2023 0000)  Discharge to home or other facility with appropriate resources: Identify barriers to discharge with patient and caregiver     Problem: Pain  Goal: Verbalizes/displays adequate comfort level or baseline comfort level  Outcome: Progressing  Flowsheets (Taken 02/18/2023 2145 by Vidal Schwalbe, RN)  Verbalizes/displays adequate comfort level or baseline comfort level: Encourage patient to monitor pain and request assistance     Problem: Skin/Tissue Integrity  Goal: Absence of new skin breakdown  Description: 1.  Monitor for areas of redness and/or skin breakdown  2.  Assess vascular access sites hourly  3.  Every 4-6 hours minimum:  Change oxygen saturation probe site  4.  Every 4-6 hours:  If on nasal continuous positive airway pressure, respiratory therapy assess nares and determine need for appliance change or resting period.  Outcome: Progressing     Problem: Safety - Adult  Goal: Free from fall injury  Outcome: Progressing     Problem: Chronic Conditions and Co-morbidities  Goal: Patient's chronic conditions and co-morbidity symptoms are monitored and maintained or improved  Outcome: Progressing  Flowsheets (Taken 02/19/2023 0000)  Care Plan - Patient's Chronic Conditions and Co-Morbidity Symptoms are Monitored and Maintained or Improved: Monitor and assess patient's chronic conditions and comorbid symptoms for stability, deterioration, or improvement

## 2023-02-19 NOTE — Progress Notes (Signed)
6045- Wife called, she got an update on the patient.  She is concerned about his nutrient and eating.  She would like the doctor to call her today with an update.  She said she called all day yesterday and kept being put on hold or told someone would call back.  She lives in [place] 3 hrs away.  I will relay this to day nurse.

## 2023-02-19 NOTE — Plan of Care (Signed)
Problem: Discharge Planning  Goal: Discharge to home or other facility with appropriate resources  02/19/2023 1110 by Amparo Bristol, RN  Outcome: Progressing  02/19/2023 0734 by Hilliard Clark, RN  Outcome: Progressing  Flowsheets (Taken 02/19/2023 0000)  Discharge to home or other facility with appropriate resources: Identify barriers to discharge with patient and caregiver     Problem: Pain  Goal: Verbalizes/displays adequate comfort level or baseline comfort level  02/19/2023 1110 by Amparo Bristol, RN  Outcome: Progressing  02/19/2023 0734 by Hilliard Clark, RN  Outcome: Progressing  Flowsheets (Taken 02/18/2023 2145 by Vidal Schwalbe, RN)  Verbalizes/displays adequate comfort level or baseline comfort level: Encourage patient to monitor pain and request assistance     Problem: Skin/Tissue Integrity  Goal: Absence of new skin breakdown  Description: 1.  Monitor for areas of redness and/or skin breakdown  2.  Assess vascular access sites hourly  3.  Every 4-6 hours minimum:  Change oxygen saturation probe site  4.  Every 4-6 hours:  If on nasal continuous positive airway pressure, respiratory therapy assess nares and determine need for appliance change or resting period.  02/19/2023 1110 by Amparo Bristol, RN  Outcome: Progressing  02/19/2023 0734 by Hilliard Clark, RN  Outcome: Progressing     Problem: Safety - Adult  Goal: Free from fall injury  02/19/2023 1110 by Amparo Bristol, RN  Outcome: Progressing  02/19/2023 0734 by Hilliard Clark, RN  Outcome: Progressing     Problem: Chronic Conditions and Co-morbidities  Goal: Patient's chronic conditions and co-morbidity symptoms are monitored and maintained or improved  02/19/2023 1110 by Amparo Bristol, RN  Outcome: Progressing  02/19/2023 0734 by Hilliard Clark, RN  Outcome: Progressing  Flowsheets (Taken 02/19/2023 0000)  Care Plan - Patient's Chronic Conditions and Co-Morbidity Symptoms are Monitored and Maintained or Improved: Monitor and assess  patient's chronic conditions and comorbid symptoms for stability, deterioration, or improvement

## 2023-02-19 NOTE — Care Coordination-Inpatient (Addendum)
Transition of Care Plan:    Hospice - home vs LTC/hospice  - Mass referral for LTC beds in [facility] pending    Transport: Pt spouse willing to come get Pt, plans to bring a friend who is RN    RUR: 14%  Prior Level of Functioning: Independent  Disposition: Hospice  If SNF or IPR: Date FOC offered: 5/15  Date FOC received: 5/15  Accepting facility:   Date authorization started with reference number:   Date authorization received and expires:   Follow up appointments: PCP (Pt sees Dr. Doran Heater), Psych, Neuro  DME needed: TBD  Transportation at discharge: TBD  Caregiver Contact: Spouse, Laverne Seright [phone]  Discharge Caregiver contacted prior to discharge?   Care Conference needed? TBD  Barriers to discharge: Medical, SLP - Pt on diet however may not meet nutritional goals. Psych folllowing. PT/OT on board - Pt not participating currently with therapy. Placement - needs LTC placement in [place]. Pt with hx of substance abuse, psych stay.     CM spoke with Pt's spouse Leia Alf in follow up to Dr. Robynn Pane discussion regarding Pt overall prognosis. Leia Alf is clear she does want Pt to return home to [place] but she is not able to provide total care for Pt as he will require moving forward. She is open to LTC placement, she is also open to potential hospice discussion if Pt will not meet nutritional goals/require a PEG tube. Leia Alf stated if Pt did not have a quality of life he would not want to live that way/would likely not want a feeding tube.     Pt's spouse Leia Alf has a friend who works with [place] Medicaid - CM spoke with this friend, Magazine features editor of placement for [facility] Medicaid LTC bed.   - Pt spouse is to request a "program change" through [facility] DSS - from regular Medicaid to LTC Medicaid  - Once bed is located in [place] in LTC facility, Pt's PCP or facility physician completes a FL2 form, qualifying Pt for LTC bed. No [facility] equivalent needed prior to placement.   - [place] Medicaid does  not have a Level 2 equivalent for placement (given Pt's recent psych stay)    Pt spouse is in agreement for CM to pursue LTC placement and send mass referrals. Mass referral sent in [facility] 60+ facilities in 40 mile radius of Pt home. CM informed Pt spouse placement barriers would include Pt age, substance use hx and recent psych stay. Regarding transport, Pt spouse willing to transport Pt if needed - stated she would lay down all seats for Pt and travel with a friend who is a Armed forces technical officer.     Would recommending Palliative consult for GOC discussion, Dr. Anastasia Fiedler informed.      4:00pm: Pt spouse would like hospice per Dr. Anastasia Fiedler. CM spoke with Pt spouse [name] regarding home hospice vs LTC/hospice. She would like to consider both options. She plans to travel to [facility] this evening to be with Pt.    Referral made to [facility].    Lucina Mellow) Barbette Merino, M.S.W.

## 2023-02-19 NOTE — Plan of Care (Signed)
Problem: Physical Therapy - Adult  Goal: By Discharge: Performs mobility at highest level of function for planned discharge setting.  See evaluation for individualized goals.  Description: FUNCTIONAL STATUS PRIOR TO ADMISSION: Independent without deficits prior to recent hospitalizations (per pt's wife, pt hasn't been up OOB in the last week and a half at OSH)    HOME SUPPORT PRIOR TO ADMISSION:  Pt has been living in/out of hotels and halfway house over the past month; is unable to return home with his wife due to active substance use.      Physical Therapy Goals  Initiated 02/17/2023  1.  Patient will move from supine to sit and sit to supine and roll side to side in bed with minimal assistance within 7 day(s).    2.  Patient will perform sit to stand with minimal assistance within 7 day(s).  3.  Patient will transfer from bed to chair and chair to bed with minimal assistance using the least restrictive device within 7 day(s).  4.  Patient will ambulate with minimal assistance for 50 feet with the least restrictive device within 7 day(s).     Outcome: Not Progressing   PHYSICAL THERAPY TREATMENT    Patient: Lucas Hicks (42 y.o. male)  Date: 02/19/2023  Diagnosis: AMS (altered mental status) [R41.82] AMS (altered mental status)      Precautions: Fall Risk                      ASSESSMENT:  Patient seen for acute PT intervention session and is not progressing towards goals.  Pt with no command following in session, actively resisting all attempts at repositioning, turning, and pericare at bedlevel.  Pt requiring total A of 2 for bed level activity, no attempts to initiate movement or communicate with therapy staff.  Unsafe to trial upright/OOB activity due to pt's active resistance to minimal bed level activity and lack of command following.  Due to cognitive deficits and inability to follow commands or actively participate with PT, PT will sign-off as patient is inappropriate for skilled PT intervention.  Please  reconsult should mental status improve for patient to be able to actively participate in session.          PLAN:  Patient continues to benefit from skilled intervention to address the above impairments.  Continue treatment per established plan of care.    Recommendation for discharge: (in order for the patient to meet his/her long term goals): No skilled physical therapy  LTC facility    IF patient discharges home will need the following DME: none       SUBJECTIVE:   Patient stated,  nonverbal    OBJECTIVE DATA SUMMARY:   Pt received supine in bed.  Incontinent of stool on PT arrival.      Critical Behavior:  Orientation  Overall Orientation Status: Impaired  Orientation Level: Disoriented X4  Cognition  Overall Cognitive Status: Exceptions  Arousal/Alertness: Inconsistent responses to stimuli  Following Commands: Does not follow commands  Attention Span: Unable to maintain attention  Memory: Impaired  Safety Judgement: Impaired  Problem Solving: Impaired  Insights: Not aware of deficits  Initiation: Requires cues for all  Sequencing: Requires cues for all  Cognition Comment: no attempts to verbalize; no tracking, actively resisting all attempts at basic mobility    Functional Mobility Training:  Bed Mobility:  Bed Mobility Training  Bed Mobility Training: Yes  Overall Level of Assistance: Total assistance;Assist X2  Rolling: Assist X2;Total assistance  Pain Rating:  Appears comfortable    Activity Tolerance:   Poor    After treatment:   Patient left in no apparent distress in bed, Call bell within reach, and Bed/ chair alarm activated      COMMUNICATION/EDUCATION:   The patient's plan of care was discussed with: occupational therapist and registered nurse    Patient Education  Education Given To: Patient  Education Provided: Role of Therapy  Education Method: Verbal  Barriers to Learning: Cognition  Education Outcome: Continued education needed      Rubye Oaks, PT  Minutes: 10

## 2023-02-19 NOTE — Plan of Care (Signed)
Problem: SLP Adult - Impaired Swallowing  Goal: By Discharge: Advance to least restrictive diet without signs or symptoms of aspiration for planned discharge setting.  See evaluation for individualized goals.  Note: Evaluation completed 5/15:   Pt will tolerate the least restrictive diet without overt s/s of aspiration or changes in respiratory status within 7 days.     Speech LAnguage Pathology EVALUATION    Patient: Lucas Hicks (42 y.o. male)  Date: 02/19/2023  Primary Diagnosis: AMS (altered mental status) [R41.82]       Precautions: Aspiration Fall Risk                  ASSESSMENT :  Pt is a 42 yo male transferred to [facility] from an inpatient behavioral health unit with hx of polysubstance abuse with cocaine and heroine. Pt had an overdose in April requiring intubation. Pt was TDO on 5/1 for psychosis after being found in the middle of the street taking his clothes off.  He was positive for both cocaine +benzos. Pt now has an easy to chew diet with thin liquids. RN reports decreased command following, chewed his medications this morning and now more awake and alert.    MRI: Extensive confluent diffuse T2/FLAIR hyperintensity throughout the  supratentorial white matter as above with associated mild restricted diffusion.  Findings are favored to represent a toxic/metabolic process, such as that seen  with carbon monoxide poisoning, drugs of abuse such as inhaled heroin, or  metabolic conditions such as thyroid encephalopathy, among others. This  appearance is also typical for delayed post hypoxic leukoencephalopathy if the  patient has a history of subacute/recent prolonged hypoxic state/cardiac arrest.  Much less likely this could represent an atypical demyelinating process or an  atypical inflammatory leukoencephalitis given symmetry.     Pt observed with thin liquids via cup, applesauce and easy to chew meats. Pt responded 'huh' to his name and will shake his head no occasionally with no consistency. He accepted  ice chips without difficulty followed by functional mastication and mild delayed swallow without coughing or choking. Pt with delayed propulsion, but functional, noted in puree and easy to chew with functional oral clearance. X2 throat clears were noted throughout. Pt did demonstrate x1 gag reflex with initial presentation of applesauce that was not repeated. Pt with reflexive bite that improved over time and unable to suck through straw as he was chewing on it. Pt taking thin liquids via spoon and cup after multiple attempts.     Given above, cautiously recommend continuing with an easy to chew diet and thin liquids via cup. He will require 1:1 assistance with feeding using strategies below. Low threshold to downgrade if s/s of aspiration are present. SLP will follow closely.    Patient will benefit from skilled intervention to address the above impairments.     PLAN :  Recommendations and Planned Interventions:  Diet: Easy to chew and thin liquids  -upright for all po intake  -remain upright for at least 30 minutes after po intake  -will need 1:1 assistance with feeding  -small bites and sips via cup (pt has reflective bite)  -low threshold to downgrade if s/s of aspiration are present      Recommend next SLP session: diet tolerance    Acute SLP Services: Yes, patient will be followed by speech-language pathology 3x/week to address goals. Patient's rehabilitation potential is considered to be Guarded.  Discharge Recommendations: Continue to assess pending progress     SUBJECTIVE:   Pt  laying in bed and constantly moving in the bed, very restless.    OBJECTIVE:     Past Medical History:   Diagnosis Date    Abscess     Right  index finger, per records pt undergone   incision & drainage  and was discharged home on 28th with prescription for Ceftin, Per [facility] ED rn verball report pton Ceftin 500mg  BID    Asthma     CHF (congestive heart failure) (HCC)     Chronic depression     Scar     surgical scar on Right  knee area    Schizophrenia (HCC)     per prescreen pt has hx of schizophrenia diagnosis    Tattoos     tattoos distributed on upper back, Right nec, left head behind ear, left forarm, Right forarm, Right arm circular tattoe     Past Surgical History:   Procedure Laterality Date    APPENDECTOMY      as noted on CT done in 2022     Prior Level of Function/Home Situation:   Social/Functional History  Lives With: Spouse (and 3 children (30 y/o, 72 y/o, 35 y/o))  Type of Home: Condo  Home Layout: Multi-level, Able to Live on Main level with bedroom/bathroom  Home Access: Stairs to enter without rails  Entrance Stairs - Number of Steps: 1  Bathroom Shower/Tub: Chief of Staff: None  Has the patient had two or more falls in the past year or any fall with injury in the past year?: No  ADL Assistance: Independent  Homemaking Assistance: Independent  Ambulation Assistance: Independent  Transfer Assistance: Independent  Active Driver: Yes  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 Trials:  PO Trials  Neuromuscular Estim Used: No  Assessment Method(s): Observation;Palpation  Patient Position: upright in bed  Consistency Presented: Easy to chew;Pureed;Thin;Ice Chips  How Presented: SLP-fed/Presented;Cup/sip;Spoon  Bolus Acceptance: Impaired  Bolus Formation/Control: Impaired  Type of Impairment: Delayed  Propulsion: Delayed (# of seconds)  Oral Residue: None  Initiation of Swallow: Delayed (# of seconds)  Laryngeal Elevation: Functional  Aspiration  Signs/Symptoms: Throat clear (throat clear x1)  Effective Modifications: Small sips and bites;Cup/sip    Respiratory Status/Airway:  Room air    Functional Oral Intake Scale (FOIS): 6--Total oral diet with multiple consistencies without special preparation, but with specific food limitations    After treatment:   Patient left in no apparent distress in bed, Call bell left within reach, and Nursing notified    COMMUNICATION/EDUCATION:   The patient's plan of care including recommendations, planned interventions, and recommended diet changes were discussed with: Registered nurse    Patient is unable to participate in goal setting and plan of care    Thank you,  Harl Bowie, SLP  Minutes: 26

## 2023-02-19 NOTE — Progress Notes (Signed)
I have reviewed and agree with Lucas Hicks' charting.

## 2023-02-19 NOTE — Progress Notes (Signed)
[name]                                                                                          Hospitalist Progress Note  Rogue Bussing, MD  Office Phone: [phone]        Date of Service:  02/19/2023  NAME:  Lucas Hicks  DOB:  Jan 16, 1981  MRN:  [id number]       Admission Summary:   42 y.o man with a history of CHF, HTN, polysubstance abuse including heroin and cocaine, ?schizophrenia, who was admitted to an outside behavioral health unit on May 1 for bizarre behavior and paranoia. He was transferred here for medical work-up due to lack of improvement at [facility].       Interval history / Subjective:   He is a poor historian and unable to provide any history.      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 He is currently completely care-dependent, and would need a PEG. She said he wouldn't want that and prefers the comfort-care hospice route. Ideally, go to [facility] with hospice.    R index finger cellulitis w/ abscess:  -completed outpt abx    Polysubstance abuse    Chronic HCV          Code status: full  Prophylaxis: sc Lovenox  Care Plan discussed with: rn/cm  Anticipated Disposition:   Inpatient  Cardiac monitoring: Telemetry  Central Line:            Social Determinants of Health     Tobacco Use: Low Risk  (02/06/2023)    Patient History     Smoking Tobacco Use: Never     Smokeless Tobacco Use: Never     Passive Exposure: Not on file   Alcohol Use: Not At Risk (02/05/2023)    AUDIT-C     Frequency of Alcohol Consumption: Never     Average Number of  Drinks: Patient does not drink     Frequency of Binge Drinking: Never   Financial Resource Strain: Not on file   Food Insecurity: Patient Unable To Answer (02/05/2023)    Hunger Vital Sign     Worried About Running Out of Food in the Last Year: Patient unable to answer     Ran Out of Food in the Last Year: Patient unable to answer   Transportation Needs: Patient Unable To Answer (02/05/2023)    PRAPARE - Transportation     Lack of Transportation (Medical): Patient unable to answer     Lack of Transportation (Non-Medical): Patient unable to answer   Physical Activity: Not on file   Stress: Not on file   Social Connections: Not on file   Intimate Partner  Violence: Not on file   Depression: Not at risk (02/05/2023)    PHQ-2     PHQ-2 Score: 0   Housing Stability: Patient Unable To Answer (02/05/2023)    Housing Stability Vital Sign     Unable to Pay for Housing in the Last Year: Patient unable to answer     Number of Places Lived in the Last Year: 0     Unstable Housing in the Last Year: Patient unable to answer   Interpersonal Safety: Patient Unable To Answer (02/05/2023)    Interpersonal Safety Domain Source: IP Abuse Screening     Physical abuse: Unable to assess     Verbal abuse: Unable to assess     Emotional abuse: Unable to assess      Financial abuse: Unable to assess      Sexual abuse: Unable to assess    Utilities: Patient Unable To Answer (02/05/2023)    [facility] Utilities     Threatened with loss of utilities: Patient unable to answer       Review of Systems:   Review of systems not obtained due to patient factors.       Vital Signs:    Last 24hrs VS reviewed since prior progress note. Most recent are:  Vitals:    02/19/23 1400   BP:    Pulse: 69   Resp:    Temp:    SpO2:          Intake/Output Summary (Last 24 hours) at 02/19/2023 1553  Last data filed at 02/19/2023 0981  Gross per 24 hour   Intake --   Output 1200 ml   Net -1200 ml          Physical Examination:     I had a face to face encounter with this patient and  independently examined them on 02/19/2023 as outlined below:          General : NAD, lethargic  HEENT: anicteric sclerae  Chest: Clear to auscultation bilaterally   CVS: S1 S2 heard, Capillary refill less than 2 seconds  Abd: soft/ non tender, non distended, BS physiological,   Ext: no clubbing, no cyanosis, no edema  Neuro/Psych: lethargic unable to participate in exam spastic extremities  Skin: warm     Data Review:    Review and/or order of clinical lab test  Review and/or order of tests in the radiology section of CPT  Review and/or order of tests in the medicine section of CPT      I have personally and independently reviewed all pertinent labs, diagnostic studies, imaging, and have provided independent interpretation of the same.     Labs:     Recent Labs     02/18/23  2108 02/19/23  0406   WBC 9.7 7.8   HGB 12.0* 11.6*   HCT 36.2* 34.2*   PLT 218 193       Recent Labs     02/18/23  2108 02/19/23  0406   NA 137 138   K 3.2* 3.5   CL 106 110*   CO2 23 23   BUN 15 14   MG 1.9  --    PHOS 2.9  --        Recent Labs     02/18/23  2108   ALT 83*   GLOB 3.7       No results for input(s): "INR", "APTT" in the last 72 hours.    Invalid input(s): "PTP"   No results  for input(s): "TIBC" in the last 72 hours.    Invalid input(s): "FE", "PSAT", "FERR"   No results found for: "RBCF"   No results for input(s): "PH", "PCO2", "PO2" in the last 72 hours.  No results for input(s): "CPK" in the last 72 hours.    Invalid input(s): "CPKMB", "CKNDX", "TROIQ"  Lab Results   Component Value Date/Time    CHOL 143 02/06/2023 11:40 AM    HDL 38 02/06/2023 11:40 AM    LDL 88.4 02/06/2023 11:40 AM     No results found for: "GLUCPOC"  @LABUA @    Notes reviewed from all clinical/nonclinical/nursing services involved in patient's clinical care. Care coordination discussions were held with appropriate clinical/nonclinical/ nursing providers based on care coordination needs.         Patients current active Medications were reviewed, considered,  added and adjusted based on the clinical condition today.      Home Medications were reconciled to the best of my ability given all available resources at the time of admission. Route is PO if not otherwise noted.      Admission Status:[id number]:::1}      Medications Reviewed:     Current Facility-Administered Medications   Medication Dose Route Frequency    baclofen (LIORESAL) tablet 2.5 mg  2.5 mg Oral TID PRN    gadoteridol (PROHANCE) injection 14 mL  14 mL IntraVENous ONCE PRN    sodium chloride flush 0.9 % injection 5-40 mL  5-40 mL IntraVENous BID    0.9 % sodium chloride infusion   IntraVENous Continuous    sodium chloride flush 0.9 % injection 5-40 mL  5-40 mL IntraVENous 2 times per day    sodium chloride flush 0.9 % injection 5-40 mL  5-40 mL IntraVENous PRN    0.9 % sodium chloride infusion   IntraVENous PRN    potassium chloride (KLOR-CON) extended release tablet 40 mEq  40 mEq Oral PRN    Or    potassium bicarb-citric acid (EFFER-K) effervescent tablet 40 mEq  40 mEq Oral PRN    Or    potassium chloride 10 mEq/100 mL IVPB (Peripheral Line)  10 mEq IntraVENous PRN    magnesium sulfate 2000 mg in 50 mL IVPB premix  2,000 mg IntraVENous PRN    enoxaparin (LOVENOX) injection 40 mg  40 mg SubCUTAneous Daily    ondansetron (ZOFRAN-ODT) disintegrating tablet 4 mg  4 mg Oral Q8H PRN    Or    ondansetron (ZOFRAN) injection 4 mg  4 mg IntraVENous Q6H PRN    polyethylene glycol (GLYCOLAX) packet 17 g  17 g Oral Daily PRN    acetaminophen (TYLENOL) tablet 650 mg  650 mg Oral Q6H PRN    Or    acetaminophen (TYLENOL) suppository 650 mg  650 mg Rectal Q6H PRN    multivitamin 1 tablet  1 tablet Oral Daily    buPROPion (WELLBUTRIN XL) extended release tablet 150 mg  150 mg Oral Daily    hydrOXYzine HCl (ATARAX) tablet 50 mg  50 mg Oral TID PRN     ______________________________________________________________________  EXPECTED LENGTH OF STAY: 10  ACTUAL LENGTH OF STAY:          4                 Rogue Bussing, MD

## 2023-02-19 NOTE — Plan of Care (Signed)
Problem: Occupational Therapy - Adult  Goal: By Discharge: Performs self-care activities at highest level of function for planned discharge setting.  See evaluation for individualized goals.  Description: FUNCTIONAL STATUS PRIOR TO ADMISSION:  Patient was ambulatory using no DME   , ADL Assistance: Independent,  ,  ,  ,  ,  , Homemaking Assistance: Independent, Ambulation Assistance: Independent, Transfer Assistance: Independent, Active Driver: Yes     HOME SUPPORT: Patient was living in and out of hotels/half way house. Pt unable to return home with wife/children due to active substance abuse    Occupational Therapy Goals:  Initiated 02/17/2023  1.  Patient will perform self-feeding with Contact Guard Assist within 7 day(s).  2.  Patient will perform grooming with Moderate Assist within 7 day(s).  3.  Patient will perform bathing with Moderate Assist within 7 day(s).  4.  Patient will perform toilet transfers with Moderate Assist  within 7 day(s).  5.  Patient will perform all aspects of toileting with Moderate Assist within 7 day(s).  6.  Patient will participate in upper extremity therapeutic exercise/activities with Moderate Assist for 5 minutes within 7 day(s).    7.  Patient will utilize energy conservation techniques during functional activities with verbal cues within 7 day(s).   Outcome: Not Progressing   OCCUPATIONAL THERAPY TREATMENT  Patient: Lucas Hicks (42 y.o. male)  Date: 02/19/2023  Primary Diagnosis: AMS (altered mental status) [R41.82]       Precautions: Fall Risk                Chart, occupational therapy assessment, plan of care, and goals were reviewed.    ASSESSMENT  Patient continues to benefit from skilled OT services and is not progressing towards goals. Pt received semi-reclined in bed, cleared by RN. Pt was no-verbal, resistive, and not-following basic commands throughout session. Pt was Total Ax2 for rolling in bed for bed level hygiene, pt noted to have increased resistance with bed  level hygiene, with decreased command following at time no able to progress with OT, please reconsult if status of pt changes. Pt was left semi-reclined in with all needs met, call bell in reach. Pt presents to be at new              PLAN :  Patient continues to benefit from skilled intervention to address the above impairments.  Continue treatment per established plan of care to address goals.    Recommend with staff: modified chair position as tolerated       Recommendation for discharge: (in order for the patient to meet his/her long term goals): Therapy up to 5 days/week in Skilled nursing facility    Other factors to consider for discharge: no local support, available support system works or is unable to provide adequate supervision and the patient would be alone, patient's current support system is unable to meet their requirements for physical assistance, poor safety awareness, impaired cognition, high risk for falls, not safe to be alone, and concern for safely navigating or managing the home environment    IF patient discharges home will need the following DME: none       SUBJECTIVE:   Patient stated; pt was non verbal throughout session    OBJECTIVE DATA SUMMARY:   Cognitive/Behavioral Status:          Functional Mobility and Transfers for ADLs:  Bed Mobility:  Bed Mobility Training  Bed Mobility Training: Yes  Overall Level of Assistance: Total assistance;Assist X2  Rolling:  Assist X2;Total assistance         ADL Intervention:             Toileting: .  - Bowel Hygiene : Total Assistance and Bed level            Activity Tolerance:   Poor and requires frequent rest breaks  Please refer to the flowsheet for vital signs taken during this treatment.    After treatment:   Patient left in no apparent distress in bed, Call bell within reach, Caregiver / family present, and Side rails x3    COMMUNICATION/EDUCATION:   The patient's plan of care was discussed with: physical therapist, registered nurse, and case  manager         Thank you for this referral.  Margot Ables, OT  Minutes: 10

## 2023-02-20 ENCOUNTER — Inpatient Hospital Stay: Payer: PRIVATE HEALTH INSURANCE

## 2023-02-20 MED ORDER — MICONAZOLE NITRATE 2 % EX CREA
2 | Freq: Two times a day (BID) | CUTANEOUS | Status: DC
Start: 2023-02-20 — End: 2023-02-23
  Administered 2023-02-21 – 2023-02-23 (×6): via TOPICAL

## 2023-02-20 MED FILL — ENOXAPARIN SODIUM 40 MG/0.4ML IJ SOSY: 40 MG/0.4ML | INTRAMUSCULAR | Qty: 0.4

## 2023-02-20 MED FILL — BUPROPION HCL ER (XL) 150 MG PO TB24: 150 MG | ORAL | Qty: 1

## 2023-02-20 MED FILL — THERA PO TABS: ORAL | Qty: 1

## 2023-02-20 MED FILL — MICONAZOLE NITRATE 2 % EX CREA: 2 % | CUTANEOUS | Qty: 30

## 2023-02-20 NOTE — Plan of Care (Signed)
Problem: SLP Adult - Impaired Swallowing  Goal: By Discharge: Advance to least restrictive diet without signs or symptoms of aspiration for planned discharge setting.  See evaluation for individualized goals.  Outcome: Progressing     Speech LAnguage Pathology TREATMENT    Patient: Lucas Hicks (42 y.o. male)  Date: 02/20/2023  Primary Diagnosis: AMS (altered mental status) [R41.82]       Precautions: Aspiration, Fall Risk                  ASSESSMENT :  Pt sitting upright in bed with wife at the bedside.  They are going to place a PEG tube tomorrow and downgraded his diet to puree, thin liquids. Pt's wife is planning to take him home with hospice after the PEG tube placement. Pt does require max assist to feed with a reflexive bite. Pt accepts the bolus with an open mouth that improves with time for both spoon and cup presentation. Pt with prolonged a/p transit and overall bolus control. Pt without overt s/s of aspiration on any consistency.  Education provided to the wife regarding his diet, preparation, compensatory strategies and swallow precautions with appreciation.  Recommend continuing with po intake despite PEG tube to maintain swallow function.     Patient will benefit from skilled intervention to address the above impairments.     PLAN :  Recommendations and Planned Interventions:  Diet: Puree and thin liquids  -upright for all po intake  -remain upright for at least 30 minutes after po intake  -1:1 assistance with feeding  -only feed when awake, alert and accepting  -medications crushed with puree    Acute SLP Services: Yes, SLP will continue to follow per plan of care.    Discharge Recommendations: No, additional SLP treatment not indicated at discharge     SUBJECTIVE:   Patient stated, "(nonverbal)."    OBJECTIVE:     Past Medical History:   Diagnosis Date    Abscess     Right  index finger, per records pt undergone   incision & drainage  and was discharged home on 28th with prescription for Ceftin,  Per [facility] ED rn verball report pton Ceftin 500mg  BID    Asthma     CHF (congestive heart failure) (HCC)     Chronic depression     Scar     surgical scar on Right knee area    Schizophrenia (HCC)     per prescreen pt has hx of schizophrenia diagnosis    Tattoos     tattoos distributed on upper back, Right nec, left head behind ear, left forarm, Right forarm, Right arm circular tattoe     Past Surgical History:   Procedure Laterality Date    APPENDECTOMY      as noted on CT done in 2022     Prior Level of Function/Home Situation:   Social/Functional History  Lives With: Spouse (and 3 children (66 y/o, 68 y/o, 54 y/o))  Type of Home: Condo  Home Layout: Multi-level, Able to Live on Main level with bedroom/bathroom  Home Access: Stairs to enter without rails  Entrance Stairs - Number of Steps: 1  Bathroom Shower/Tub: Chief of Staff: None  Has the patient had two or more falls in the past year or any fall with injury in the past year?: No  ADL Assistance: Independent  Homemaking Assistance: Independent  Ambulation Assistance: Independent  Transfer Assistance: Independent  Active Driver: Yes  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 Trials:  PO Trials  Neuromuscular Estim Used: No  Assessment Method(s): Observation  Consistency Presented: Pureed;Thin  How Presented: SLP-fed/Presented;Cup/sip;Spoon  Bolus Acceptance: Impaired  Bolus Formation/Control: Impaired  Type of Impairment: Delayed  Propulsion: Delayed (# of seconds)  Oral Residue:  None  Initiation of Swallow: Delayed (# of seconds)  Aspiration Signs/Symptoms: None  Effective Modifications: Small sips and bites     Respiratory Status/Airway:  Room air    After treatment:   Patient left in no apparent distress in bed, Call bell left within reach, Nursing notified, and Caregiver present    COMMUNICATION/EDUCATION:   The patient's plan of care including recommendations, planned interventions, and recommended diet changes were discussed with: Registered nurse    Patient/family have participated as able in goal setting and plan of care    Thank you,  Harl Bowie, SLP  Minutes: 12

## 2023-02-20 NOTE — Wound Image (Signed)
WOCN Note:     New consult for scrotal redness     Lucas Hicks is a 42 y.o. y/o male who presented for AMS (altered mental status) [R41.82]  Admitted on 02/15/2023    Past Medical History:   Diagnosis Date    Abscess     Right  index finger, per records pt undergone   incision & drainage  and was discharged home on 28th with prescription for Ceftin, Per [facility] ED rn verball report pton Ceftin 500mg  BID    Asthma     CHF (congestive heart failure) (HCC)     Chronic depression     Scar     surgical scar on Right knee area    Schizophrenia (HCC)     per prescreen pt has hx of schizophrenia diagnosis    Tattoos     tattoos distributed on upper back, Right nec, left head behind ear, left forarm, Right forarm, Right arm circular tattoe       Lab Results   Component Value Date/Time    WBC 7.8 02/19/2023 04:06 AM    LABA1C 5.4 02/06/2023 11:40 AM    HGB 11.6 (L) 02/19/2023 04:06 AM    HCT 34.2 (L) 02/19/2023 04:06 AM    PLT 193 02/19/2023 04:06 AM        Tobacco Use      Smoking status: Never      Smokeless tobacco: Never      Tobacco comments: Pt non smoker     ADULT DIET; Dysphagia - Pureed  ADULT ORAL NUTRITION SUPPLEMENT; Breakfast, Lunch, Dinner; Educational psychologist Protein Oral Supplement     Assessment:   Seen today in room 657/01   Alert and not communicative. Makes no indications of pain  Mobile and repositions independently but requires assistance as he does not follow commands and fights turning somewhat   Turned onto left side.    Wearing briefs  Surface: gel mattress  Bilateral heels intact and without erythema. Legs area restless   Sacrum intact without erythema    Rash to Scrotum, Bilateral Groin and Gluteal Cleft  Erythema to scrotum and erythema and satellite lesions to groin and gluteal cleft      Wound Recommendations:    Scrotum, bilateral groin, and gluteal cleft: Twice Daily - apply miconazole ointment, do not combine with zinc  paste     PI Prevention:  Turn/reposition approximately every 2 hours  Offload heels with heels hanging off end of pillow at all times while in bed.  Sacral Foam dressing: lift to assess regularly; change as needed. Discontinue if incontinence is frequently soiling dressing.       Discussed assessment and plan with patient's wife, Dr. Lyndel Pleasure and RN, Yvonna Alanis.    Transition of Care: Plan to follow weekly and as needed while admitted to hospital.      Isaac Bliss  MSN, [facility]  [facility]  Office [phone]   Available through Perfect Serve

## 2023-02-20 NOTE — Progress Notes (Addendum)
Called IR to ask about peg order. MD stated he cannot do it today but to make pt npo at midnight, and they will try tomorrow.

## 2023-02-20 NOTE — Progress Notes (Signed)
[name]                                                                                          Hospitalist Progress Note  Lynden Ang, MD  Office Phone: [phone]        Date of Service:  02/20/2023  NAME:  Lucas Hicks  DOB:  12/11/1980  MRN:  [id number]       Admission Summary:   42 y.o man with a history of CHF, HTN, polysubstance abuse including heroin and cocaine, ?schizophrenia, who was admitted to an outside behavioral health unit on May 1 for bizarre behavior and paranoia. He was transferred here for medical work-up due to lack of improvement at [facility].       Interval history / Subjective:   Patient seen and examined.  Unable to provide any history, does not look at me, does not track, does not follow any commands, does not answer any questions.  Wife at the bedside.  She would like to take him home with hospice, but is requesting a feeding tube prior to discharge.  Patient has accepted some liquids, but is not able to chew or swallow any solids.     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 He is currently completely care-dependent, and would need a PEG. She said he wouldn't want that and prefers the comfort-care hospice route. Ideally, go to [facility] with hospice.  -5/16: IR consulted for PEG tube placement;    R index finger cellulitis w/ abscess:  -completed outpt abx    Polysubstance abuse  -Per wife patient was clean for about a year, recently relapsed;     Chronic HCV;          Code status: full  Prophylaxis: sc Lovenox  Care Plan  discussed with: Patient's wife at the bedside, RN, CM;  Anticipated Disposition: Home with hospice after PEG tube placement;   -Patient lives in [place];  Inpatient  Cardiac monitoring: Telemetry  Central Line:            Social Determinants of Health     Tobacco Use: Low Risk  (02/06/2023)    Patient History     Smoking Tobacco Use: Never     Smokeless Tobacco Use: Never     Passive Exposure: Not on file   Alcohol Use: Not At Risk (02/05/2023)    AUDIT-C     Frequency of Alcohol Consumption: Never     Average Number of Drinks: Patient does not drink     Frequency of Binge Drinking: Never   Financial Resource Strain: Not on file   Food Insecurity: Patient Unable To Answer (02/05/2023)    Hunger Vital Sign  Worried About Programme researcher, broadcasting/film/video in the Last Year: Patient unable to answer     Ran Out of Food in the Last Year: Patient unable to answer   Transportation Needs: Patient Unable To Answer (02/05/2023)    PRAPARE - Transportation     Lack of Transportation (Medical): Patient unable to answer     Lack of Transportation (Non-Medical): Patient unable to answer   Physical Activity: Not on file   Stress: Not on file   Social Connections: Not on file   Intimate Partner Violence: Not on file   Depression: Not at risk (02/05/2023)    PHQ-2     PHQ-2 Score: 0   Housing Stability: Patient Unable To Answer (02/05/2023)    Housing Stability Vital Sign     Unable to Pay for Housing in the Last Year: Patient unable to answer     Number of Places Lived in the Last Year: 0     Unstable Housing in the Last Year: Patient unable to answer   Interpersonal Safety: Patient Unable To Answer (02/05/2023)    Interpersonal Safety Domain Source: IP Abuse Screening     Physical abuse: Unable to assess     Verbal abuse: Unable to assess     Emotional abuse: Unable to assess      Financial abuse: Unable to assess      Sexual abuse: Unable to assess    Utilities: Patient Unable To Answer (02/05/2023)    [facility] Utilities     Threatened with loss of  utilities: Patient unable to answer       Review of Systems:   Review of systems not obtained due to patient factors.       Vital Signs:    Last 24hrs VS reviewed since prior progress note. Most recent are:  Vitals:    02/20/23 1200   BP:    Pulse: 84   Resp:    Temp:    SpO2:        No intake or output data in the 24 hours ending 02/20/23 1231       Physical Examination:     I had a face to face encounter with this patient and independently examined them on 02/20/2023 as outlined below:          General : NAD, lethargic  HEENT: anicteric sclerae  Chest: Clear to auscultation bilaterally   CVS: S1 S2 heard, Capillary refill less than 2 seconds  Abd: soft/ non tender, non distended, BS physiological,   Ext: no clubbing, no cyanosis, no edema  Neuro/Psych: lethargic unable to participate in exam spastic extremities  Skin: warm     Data Review:    Review and/or order of clinical lab test  Review and/or order of tests in the radiology section of CPT  Review and/or order of tests in the medicine section of CPT      I have personally and independently reviewed all pertinent labs, diagnostic studies, imaging, and have provided independent interpretation of the same.     Labs:     Recent Labs     02/18/23  2108 02/19/23  0406   WBC 9.7 7.8   HGB 12.0* 11.6*   HCT 36.2* 34.2*   PLT 218 193       Recent Labs     02/18/23  2108 02/19/23  0406   NA 137 138   K 3.2* 3.5   CL 106 110*   CO2 23 23   BUN  15 14   MG 1.9  --    PHOS 2.9  --        Recent Labs     02/18/23  2108   ALT 83*   GLOB 3.7       No results for input(s): "INR", "APTT" in the last 72 hours.    Invalid input(s): "PTP"   No results for input(s): "TIBC" in the last 72 hours.    Invalid input(s): "FE", "PSAT", "FERR"   No results found for: "RBCF"   No results for input(s): "PH", "PCO2", "PO2" in the last 72 hours.  No results for input(s): "CPK" in the last 72 hours.    Invalid input(s): "CPKMB", "CKNDX", "TROIQ"  Lab Results   Component Value Date/Time    CHOL  143 02/06/2023 11:40 AM    HDL 38 02/06/2023 11:40 AM    LDL 88.4 02/06/2023 11:40 AM     No results found for: "GLUCPOC"  @LABUA @    Notes reviewed from all clinical/nonclinical/nursing services involved in patient's clinical care. Care coordination discussions were held with appropriate clinical/nonclinical/ nursing providers based on care coordination needs.         Patients current active Medications were reviewed, considered, added and adjusted based on the clinical condition today.      Home Medications were reconciled to the best of my ability given all available resources at the time of admission. Route is PO if not otherwise noted.      Admission Status:[id number]:::1}      Medications Reviewed:     Current Facility-Administered Medications   Medication Dose Route Frequency    miconazole (MICOTIN) 2 % cream   Topical BID    baclofen (LIORESAL) tablet 2.5 mg  2.5 mg Oral TID PRN    gadoteridol (PROHANCE) injection 14 mL  14 mL IntraVENous ONCE PRN    sodium chloride flush 0.9 % injection 5-40 mL  5-40 mL IntraVENous BID    0.9 % sodium chloride infusion   IntraVENous Continuous    sodium chloride flush 0.9 % injection 5-40 mL  5-40 mL IntraVENous 2 times per day    sodium chloride flush 0.9 % injection 5-40 mL  5-40 mL IntraVENous PRN    0.9 % sodium chloride infusion   IntraVENous PRN    potassium chloride (KLOR-CON) extended release tablet 40 mEq  40 mEq Oral PRN    Or    potassium bicarb-citric acid (EFFER-K) effervescent tablet 40 mEq  40 mEq Oral PRN    Or    potassium chloride 10 mEq/100 mL IVPB (Peripheral Line)  10 mEq IntraVENous PRN    magnesium sulfate 2000 mg in 50 mL IVPB premix  2,000 mg IntraVENous PRN    enoxaparin (LOVENOX) injection 40 mg  40 mg SubCUTAneous Daily    ondansetron (ZOFRAN-ODT) disintegrating tablet 4 mg  4 mg Oral Q8H PRN    Or    ondansetron (ZOFRAN) injection 4 mg  4 mg IntraVENous Q6H PRN    polyethylene glycol (GLYCOLAX) packet 17 g  17 g Oral Daily PRN    acetaminophen  (TYLENOL) tablet 650 mg  650 mg Oral Q6H PRN    Or    acetaminophen (TYLENOL) suppository 650 mg  650 mg Rectal Q6H PRN    multivitamin 1 tablet  1 tablet Oral Daily    buPROPion (WELLBUTRIN XL) extended release tablet 150 mg  150 mg Oral Daily    hydrOXYzine HCl (ATARAX) tablet 50 mg  50 mg Oral TID  PRN     ______________________________________________________________________  EXPECTED LENGTH OF STAY: 10  ACTUAL LENGTH OF STAY:          5                 Lynden Ang, MD

## 2023-02-20 NOTE — Consults (Addendum)
Comprehensive Nutrition Assessment    Type and Reason for Visit: Initial, Consult    Nutrition Recommendations/Plan:   Agree with checking B vitamin levels. Please add 100 mg thiamine daily.  Once G-tube placed, initiate feedings slowly:  -Start Jevity 1.5 @ 25 ml/hr + 100 ml free water Q 4 hrs x 24 hrs  -If lytes stable after 24 hours, advance feeds by 10 ml Q 8 hrs. If lytes drop, please replete prior to advancing feedings.  -Goal: Jevity 1.5 @ 55 ml/hr + 100 ml free water Q 4 hrs    3. PO when pt alert/accepting.    4. Suspect wife may want to pursue bolus feedings in the home (less equipment/supplies):  Jevity 1.5 - 240 ml bolus x 5 feedings/day, flush with 75 ml before and after feedings.       Malnutrition Assessment:  Malnutrition Status:  At risk for malnutrition (02/20/23 1435)    Context:  Social/Environmental Circumstances     Findings of the 6 clinical characteristics of malnutrition:  Energy Intake:  50% or less estimated energy requirements for 1 month or longer  Weight Loss:  No significant weight loss     Body Fat Loss:  Unable to assess     Muscle Mass Loss:  Unable to assess    Fluid Accumulation:  No significant fluid accumulation    Grip Strength:  Not Performed       Nutrition Assessment:    Past Medical History:   Diagnosis Date    Abscess     Right  index finger, per records pt undergone   incision & drainage  and was discharged home on 28th with prescription for Ceftin, Per [facility] ED rn verball report pton Ceftin 500mg  BID    Asthma     CHF (congestive heart failure) (HCC)     Chronic depression     Scar     surgical scar on Right knee area    Schizophrenia (HCC)     per prescreen pt has hx of schizophrenia diagnosis    Tattoos     tattoos distributed on upper back, Right nec, left head behind ear, left forarm, Right forarm, Right arm circular tattoe   Pt admitted from outside facility for neurology consultation - severe encephalopathy 2/2 polysubstance abuse. Poor  prognosis.    Nutrition consult received for G-tube placement. Wife has elected to take pt home with hospice services, but would like a PEG placed first. Given pt AMS, he will be unable to maintain any sort of consistent nutrition/hydration intake. SLP completed bedside assessment yesterday, cleared for Easy to Chew diet with 1:1 assistance, with recommendations for diet texture downgrade if s/s of aspiration present. G-tube likely to be placed in IR tomorrow.    Given pt hx, do suspect he meets criteria for refeeding syndrome and feedings should be advanced relatively slowly to monitor any electrolyte shifts.    Continuous feedings as recommended above to provide 1815 kcals, 77 g protein, with 1520 ml free fluid, and 1810 ml free fluid (based on 22 hr schedule)  Bolus feedings to provide 1200 ml, 1800 kcals, 76 g protein, with 1660 ml free fluid, and 1950 ml total volume.    Nutritionally Significant Medications:  MVI      Estimated Daily Nutrient Needs:  Energy Requirements Based On: Kcal/kg  Weight Used for Energy Requirements: Current  Energy (kcal/day): 1765-1900 (26-28 kcal/kg)  Weight Used for Protein Requirements: Current  Protein (g/day): 65-75 (1-1.1 g/kg)  Method  Used for Fluid Requirements: 1 ml/kcal  Fluid (ml/day): 1900    Nutrition Related Findings:   Edema: None                    Last BM: 02/20/23    Wounds:   Wound Type:  (Recent I&D R finger abscess)      Current Nutrition Therapies:  Diet: Pureed  Supplements: Ensure Plus HP once/day  Meal Intake:   No data found.  Supplement Intake:  No data found.  Nutrition Support: Pending G-tube      Anthropometric Measures:  Height: 172.7 cm (5\' 8" )  Ideal Body Weight (IBW): 154 lbs (70 kg)    Admission Body Weight: 71.2 kg (157 lb)  Current Body Weight: 67.6 kg (149 lb), 96.8 % IBW. Weight Source: Bed Scale  Current BMI (kg/m2): 22.7        Weight Adjustment For: No Adjustment                 BMI Categories: Normal Weight (BMI 18.5-24.9)    Wt Readings  from Last 10 Encounters:   02/20/23 67.8 kg (149 lb 7.6 oz)   02/16/23 68 kg (150 lb)   02/16/23 68 kg (150 lb)   02/05/23 72.5 kg (159 lb 13.3 oz)           Nutrition Diagnosis:   Inadequate oral intake related to cognitive or neurological impairment as evidenced by intake 0-25%    Nutrition Interventions:   Food and/or Nutrient Delivery: Continue Current Diet, Start Tube Feeding  Nutrition Education/Counseling: No recommendation at this time  Coordination of Nutrition Care: Continue to monitor while inpatient  Plan of Care discussed with: IDRs    Goals:     Goals: Tolerate nutrition support at goal rate, by next RD assessment       Nutrition Monitoring and Evaluation:   Behavioral-Environmental Outcomes: None Identified  Food/Nutrient Intake Outcomes: Food and Nutrient Intake, Diet Advancement/Tolerance, Enteral Nutrition Intake/Tolerance  Physical Signs/Symptoms Outcomes: Biochemical Data, Meal Time Behavior, Weight, GI Status    Discharge Planning:    Enteral Nutrition     Dayton Scrape, RD  Available via PerfectServe or ext [phone]

## 2023-02-20 NOTE — Plan of Care (Signed)
Problem: Discharge Planning  Goal: Discharge to home or other facility with appropriate resources  Outcome: Progressing     Problem: Pain  Goal: Verbalizes/displays adequate comfort level or baseline comfort level  Outcome: Progressing     Problem: Skin/Tissue Integrity  Goal: Absence of new skin breakdown  Description: 1.  Monitor for areas of redness and/or skin breakdown  2.  Assess vascular access sites hourly  3.  Every 4-6 hours minimum:  Change oxygen saturation probe site  4.  Every 4-6 hours:  If on nasal continuous positive airway pressure, respiratory therapy assess nares and determine need for appliance change or resting period.  Outcome: Progressing     Problem: Safety - Adult  Goal: Free from fall injury  Outcome: Progressing     Problem: Chronic Conditions and Co-morbidities  Goal: Patient's chronic conditions and co-morbidity symptoms are monitored and maintained or improved  Outcome: Progressing

## 2023-02-20 NOTE — Care Coordination-Inpatient (Addendum)
Transition of Care Plan:    Hospice - home; following PEG placement  - [facility] has accepted and can deliver DME when Pt spouse is home  - Home personal care - arranged through [employer] DSS - Personal care form completed and faxed to [facility] Medicaid [phone]    [facility] liaison, Jenel Lucks [phone]    Transport: Stretcher - H2H quote [phone] (requested from [employer])    RUR: 14%  Prior Level of Functioning: Independent  Disposition: Hospice  If SNF or IPR: Date FOC offered: 5/15  Date FOC received: 5/15  Accepting facility:   Date authorization started with reference number:   Date authorization received and expires:   Follow up appointments: PCP (Pt sees Dr. Doran Heater), Psych, Neuro  DME needed: TBD  Transportation at discharge: TBD  Caregiver Contact: Spouse, Heberto Ferenz [phone]  Discharge Caregiver contacted prior to discharge?   Care Conference needed? TBD  Barriers to discharge: Medical, SLP - Pt on diet however may not meet nutritional goals. Spouse wants PEG placed. Also wants Hospice.    CM met with Pt spouse at bedside to discuss discharge plan. Pt spouse would like to bring Pt home with hospice services. She understands difficult LTC placement. However, she would also like a PEG placed prior to discharge.     CM spoke with [facility], Jenel Lucks [phone] - referral has been sent to [email or web address]. CM informed her PEG to be placed prior to DC, she is checking to see if they can accept.     Pt requires stretcher transport home, Pt not safe to travel by car with spouse. Pt spouse not able to cover cost of transport. CM obtained quote from [facility] for stretcher transport - [phone]. Plan to apply for assistance from [employer] fund.    2:43pm: CM spoke with Roberta with [facility] - they will accept Pt even with new PEG tube. They may not cover feedings which will need to be purchased OOP by Pt's spouse. Pt spouse aware and in  agreement.    [facility] can deliver DME and admit Pt in the home at any time including this weekend if PEG is placed by tomorrow.     Waiting on decision from [employer] regarding transport funding.    3:15pm: CM received call from Pt's spouse - she has contacted [facility] DSS and requested a Medicaid status change for personal care in the home. CM received personal care services attestation form, completed/signed by Dr. Lyndel Pleasure. Form has been faxed to [phone]. Completed form provided to Pt spouse.    Lucina Mellow) Barbette Merino, M.S.W.

## 2023-02-21 ENCOUNTER — Inpatient Hospital Stay: Admit: 2023-02-21 | Payer: MEDICAID | Attending: Family Medicine

## 2023-02-21 LAB — CULTURE, BLOOD 1: Culture: NO GROWTH

## 2023-02-21 LAB — VITAMIN E
Vitamin E Alpha: 9.2 mg/L (ref 7.0–25.1)
Vitamin E Gamma: 0.7 mg/L (ref 0.5–5.5)

## 2023-02-21 LAB — CULTURE, BLOOD 2: Culture: NO GROWTH

## 2023-02-21 LAB — VITAMIN A: VITAMIN A: 44.1 ug/dL (ref 20.1–62.0)

## 2023-02-21 MED ORDER — CEFAZOLIN SODIUM 1 G IJ SOLR
1 | INTRAMUSCULAR | Status: AC | PRN
Start: 2023-02-21 — End: 2023-02-21
  Administered 2023-02-21: 14:00:00 2000 via INTRAVENOUS

## 2023-02-21 MED ORDER — LIDOCAINE HCL 1 % IJ SOLN: 1 % | INTRAMUSCULAR | Status: AC

## 2023-02-21 MED ORDER — CEFAZOLIN SODIUM 1 G IJ SOLR: 1 g | INTRAMUSCULAR | Status: AC

## 2023-02-21 MED ORDER — LIDOCAINE HCL URETHRAL/MUCOSAL 2 % EX PRSY: 2 % | CUTANEOUS | Status: AC

## 2023-02-21 MED ORDER — DEXMEDETOMIDINE HCL 200 MCG/2ML IV SOLN: 200 MCG/2ML | INTRAVENOUS | Status: AC

## 2023-02-21 MED ORDER — FENTANYL CITRATE (PF) 100 MCG/2ML IJ SOLN
100 MCG/2ML | INTRAMUSCULAR | Status: DC | PRN
  Administered 2023-02-21: 14:00:00 50 via INTRAVENOUS

## 2023-02-21 MED ORDER — IOPAMIDOL 76 % IV SOLN
76 | INTRAVENOUS | Status: AC | PRN
Start: 2023-02-21 — End: 2023-02-21
  Administered 2023-02-21: 14:00:00 30

## 2023-02-21 MED ORDER — PROPOFOL 100 MG/10ML IV EMUL
100 MG/10ML | INTRAVENOUS | Status: DC | PRN
  Administered 2023-02-21: 14:00:00 100 via INTRAVENOUS
  Administered 2023-02-21: 14:00:00 50 via INTRAVENOUS

## 2023-02-21 MED ORDER — DEXMEDETOMIDINE HCL 200 MCG/2ML IV SOLN
200 MCG/2ML | INTRAVENOUS | Status: DC | PRN
  Administered 2023-02-21 (×2): 10 via INTRAVENOUS

## 2023-02-21 MED ORDER — KETAMINE HCL 50 MG/5ML IJ SOSY
50 MG/5ML | INTRAMUSCULAR | Status: DC | PRN
  Administered 2023-02-21: 14:00:00 30 via INTRAVENOUS

## 2023-02-21 MED ORDER — LIDOCAINE HCL (PF) 1 % IJ SOLN
1 | INTRAMUSCULAR | Status: AC | PRN
Start: 2023-02-21 — End: 2023-02-21
  Administered 2023-02-21: 14:00:00 8 via INTRADERMAL

## 2023-02-21 MED ORDER — KETAMINE HCL 50 MG/5ML IJ SOSY: 50 MG/5ML | INTRAMUSCULAR | Status: AC

## 2023-02-21 MED ORDER — MIDAZOLAM HCL 5 MG/5ML IJ SOLN: 5 MG/5ML | INTRAMUSCULAR | Status: AC

## 2023-02-21 MED ORDER — IOPAMIDOL 76 % IV SOLN: 76 % | INTRAVENOUS | Status: AC

## 2023-02-21 MED ORDER — FENTANYL CITRATE (PF) 100 MCG/2ML IJ SOLN: 100 MCG/2ML | INTRAMUSCULAR | Status: AC

## 2023-02-21 MED ORDER — MIDAZOLAM HCL 5 MG/5ML IJ SOLN
5 | INTRAMUSCULAR | Status: DC | PRN
Start: 2023-02-21 — End: 2023-02-21
  Administered 2023-02-21: 14:00:00 2 via INTRAVENOUS
  Administered 2023-02-21: 14:00:00 1 via INTRAVENOUS
  Administered 2023-02-21: 14:00:00 2 via INTRAVENOUS

## 2023-02-21 MED FILL — FENTANYL CITRATE (PF) 100 MCG/2ML IJ SOLN: 100 MCG/2ML | INTRAMUSCULAR | Qty: 2

## 2023-02-21 MED FILL — ISOVUE-370 76 % IV SOLN: 76 % | INTRAVENOUS | Qty: 100

## 2023-02-21 MED FILL — CEFAZOLIN SODIUM 1 G IJ SOLR: 1 g | INTRAMUSCULAR | Qty: 2000

## 2023-02-21 MED FILL — GLYDO 2 % EX PRSY: 2 % | CUTANEOUS | Qty: 6

## 2023-02-21 MED FILL — LIDOCAINE HCL 1 % IJ SOLN: 1 % | INTRAMUSCULAR | Qty: 20

## 2023-02-21 MED FILL — MIDAZOLAM HCL 5 MG/5ML IJ SOLN: 5 MG/ML | INTRAMUSCULAR | Qty: 5

## 2023-02-21 MED FILL — THERA PO TABS: ORAL | Qty: 1

## 2023-02-21 MED FILL — KETAMINE HCL 50 MG/5ML IJ SOSY: 50 MG/5ML | INTRAMUSCULAR | Qty: 5

## 2023-02-21 MED FILL — PRECEDEX 200 MCG/2ML IV SOLN: 200 MCG/2ML | INTRAVENOUS | Qty: 2

## 2023-02-21 MED FILL — BUPROPION HCL ER (XL) 150 MG PO TB24: 150 MG | ORAL | Qty: 1

## 2023-02-21 NOTE — Progress Notes (Signed)
Nutrition Note    Pt having G-tube placed in IR today. Pt able to tolerate oral diet, consuming about 25-50% of meal trays at present. Discussed with wife regarding adjusting feedings based on Lucas Hicks's intakes. We discussed bolus feedings in the home for ease of use, less expense and supplies.     Estimated Daily Nutrient Needs:  Energy Requirements Based On: Kcal/kg  Weight Used for Energy Requirements: Current  Energy (kcal/day): 1765-1900 (26-28 kcal/kg)  Weight Used for Protein Requirements: Current  Protein (g/day): 65-75 (1-1.1 g/kg)  Method Used for Fluid Requirements: 1 ml/kcal  Fluid (ml/day): 1900 (64 fluid ounces)      Tube Feeding Recommendations for Home:    If consumes <50% of meal, provide 1 carton (8 oz/240 ml) of Jevity 1.5 or formula equivalent.   Flush with 90 ml free water before and after formula feeding (6 oz/180 ml total)    If able to consume >50% of meal, no formula provided.    If unable to consume any meals or take oral diet throughout normal waking hours, should receive a total of 5 cartons (8 oz/240 ml each) of Jevity 1.5 or equivalent per day. This is 40 oz/1200 ml total formula volume per day.  Tube should be flushed with at least 90 ml free water before and after each feeding. This is 30 oz/900 ml free fluid per day.    5 cartons of Jevity 1.5 or equivalent per day + 90 ml free water flush before and after feedings to provide 1800 kcals, 76 g protein, with 1810 ml free fluid, and 2100 ml total volume.     Additional Recommendations:    Flush tube with at least 30 ml before and after medication administration   If tube is not being used for feedings, please flush with 60 ml water 2-3 times/day for tube maintenance.    Electronically signed by Dayton Scrape, RD on 02/21/23 at 10:03 AM EDT    Contact [phone] (ext [phone]) or via [employer]

## 2023-02-21 NOTE — Anesthesia Post-Procedure Evaluation (Signed)
Post-Anesthesia Evaluation and Assessment    Patient: Lucas Hicks MRN: [id number]  SSN: [id number]    Date of Birth: 05-07-1981  Age: 42 y.o.  Sex: male      I have evaluated the patient and they are stable and ready for discharge from the PACU.     Cardiovascular Function/Vital Signs  Visit Vitals  BP 95/61   Pulse 57   Temp 97.7 F (36.5 C) (Skin)   Resp 14   SpO2 96%       Patient is status post * No anesthesia type entered * anesthesia for * No procedures listed *.    Nausea/Vomiting: None    Postoperative hydration reviewed and adequate.    Pain:  Managed    Neurological Status:   At baseline    Mental Status, Level of Consciousness: Alert and  oriented to person, place, and time    Pulmonary Status:   Adequate oxygenation and airway patent    Complications related to anesthesia: None    Post-anesthesia assessment completed. No concerns    Signed By: Renee Ramus, MD     Feb 21, 2023

## 2023-02-21 NOTE — Progress Notes (Addendum)
0940: pt arrives via bed to angio department accompanied by transport for G tube placement procedure. All assessments completed and consent was reviewed.  Education given was regarding procedure, anesthesia sedation, post-procedure care and  management/follow-up. Opportunity for questions was provided and all questions and concerns were addressed.     1030: pt in recovery    1200: TRANSFER - OUT REPORT:    Verbal report given to bedside RN on Lucas Hicks  being transferred to NSTU for routine progression of patient care       Report consisted of patient's Situation, Background, Assessment and   Recommendations(SBAR).     Information from the following report(s) Nurse Handoff Report and Event Log was reviewed with the receiving nurse.           Lines:   Peripheral IV Left Forearm (Active)   Site Assessment Clean, dry & intact 02/21/23 0800   Line Status Normal saline locked;Flushed 02/21/23 0800   Line Care Connections checked and tightened 02/21/23 0800   Phlebitis Assessment No symptoms 02/21/23 0800   Infiltration Assessment 0 02/21/23 0800   Alcohol Cap Used Yes 02/21/23 0800   Dressing Status Clean, dry & intact 02/21/23 0800   Dressing Type Transparent 02/21/23 0800   Dressing Intervention New 02/18/23 2044        Opportunity for questions and clarification was provided.      Patient transported with:  [employer]

## 2023-02-21 NOTE — Anesthesia Pre-Procedure Evaluation (Signed)
Department of Anesthesiology  Preprocedure Note       Name:  Lucas Hicks   Age:  42 y.o.  DOB:  [birth year]                                          MRN:  [id number]         Date:  02/21/2023      Surgeon: * No surgeons listed *    Procedure: * No procedures listed *    Medications prior to admission:   Prior to Admission medications    Medication Sig Start Date End Date Taking? Authorizing Provider   OLANZapine zydis (ZYPREXA) 10 MG disintegrating tablet Take 1 tablet by mouth nightly    [provider]   alfuzosin (UROXATRAL) 10 MG extended release tablet Take 1 tablet by mouth daily    [provider]   albuterol sulfate HFA (VENTOLIN HFA) 108 (90 Base) MCG/ACT inhaler Inhale 2 puffs into the lungs every 6 hours as needed for Wheezing    [provider]       Current medications:    No current facility-administered medications for this encounter.     No current outpatient medications on file.     Facility-Administered Medications Ordered in Other Encounters   Medication Dose Route Frequency Provider Last Rate Last Admin    miconazole (MICOTIN) 2 % cream   Topical BID Lynden Ang, MD   Given at 02/21/23 0815    baclofen (LIORESAL) tablet 2.5 mg  2.5 mg Oral TID PRN Lattie Corns, MD        gadoteridol [facility]) injection 14 mL  14 mL IntraVENous ONCE PRN Ngwafang, Bleck B, MD        sodium chloride flush 0.9 % injection 5-40 mL  5-40 mL IntraVENous BID Ngwafang, Bleck B, MD   10 mL at 02/20/23 2108    0.9 % sodium chloride infusion   IntraVENous Continuous Al-Saadoon, Ovidio Hanger, MD 100 mL/hr at 02/21/23 0952 NoRateChange at 02/21/23 0952    sodium chloride flush 0.9 % injection 5-40 mL  5-40 mL IntraVENous 2 times per day Ngwafang, Bleck B, MD   10 mL at 02/20/23 2108    sodium chloride flush 0.9 % injection 5-40 mL  5-40 mL IntraVENous PRN Ngwafang, Bleck B, MD        0.9 % sodium chloride infusion   IntraVENous PRN Ngwafang, Bleck B, MD        potassium chloride (KLOR-CON) extended  release tablet 40 mEq  40 mEq Oral PRN Ngwafang, Bleck B, MD        Or    potassium bicarb-citric acid (EFFER-K) effervescent tablet 40 mEq  40 mEq Oral PRN Ngwafang, Bleck B, MD        Or    potassium chloride 10 mEq/100 mL IVPB (Peripheral Line)  10 mEq IntraVENous PRN Ngwafang, Bleck B, MD        magnesium sulfate 2000 mg in 50 mL IVPB premix  2,000 mg IntraVENous PRN Ngwafang, Bleck B, MD        enoxaparin (LOVENOX) injection 40 mg  40 mg SubCUTAneous Daily Ngwafang, Bleck B, MD   40 mg at 02/20/23 0823    ondansetron (ZOFRAN-ODT) disintegrating tablet 4 mg  4 mg Oral Q8H PRN Ngwafang, Bleck B, MD        Or    ondansetron (  ZOFRAN) injection 4 mg  4 mg IntraVENous Q6H PRN Ngwafang, Bleck B, MD        polyethylene glycol (GLYCOLAX) packet 17 g  17 g Oral Daily PRN Ngwafang, Bleck B, MD        acetaminophen (TYLENOL) tablet 650 mg  650 mg Oral Q6H PRN Ngwafang, Bleck B, MD   650 mg at 02/18/23 2038    Or    acetaminophen (TYLENOL) suppository 650 mg  650 mg Rectal Q6H PRN Ngwafang, Bleck B, MD        multivitamin 1 tablet  1 tablet Oral Daily Lattie Corns, MD   1 tablet at 02/21/23 0814    buPROPion (WELLBUTRIN XL) extended release tablet 150 mg  150 mg Oral Daily [name], APRN - CNP   150 mg at 02/21/23 1610    hydrOXYzine HCl (ATARAX) tablet 50 mg  50 mg Oral TID PRN [name], APRN - CNP           Allergies:    Allergies   Allergen Reactions    Amoxicillin     Fish Allergy Hives    Penicillin G Hives    Prednisone Other (See Comments)     irritable       Problem List:    Patient Active Problem List   Diagnosis Code    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    Urine frequency R35.0    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    Cocaine abuse (HCC) F14.10    Opioid abuse (HCC) F11.10    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    Major depression, recurrent (HCC) F33.9    AMS (altered mental status) R41.82       Past Medical History:        Diagnosis Date    Abscess     Right  index  finger, per records pt undergone   incision & drainage  and was discharged home on 28th with prescription for Ceftin, Per [facility] ED rn verball report pton Ceftin 500mg  BID    Asthma     CHF (congestive heart failure) (HCC)     Chronic depression     Scar     surgical scar on Right knee area    Schizophrenia (HCC)     per prescreen pt has hx of schizophrenia diagnosis    Tattoos     tattoos distributed on upper back, Right nec, left head behind ear, left forarm, Right forarm, Right arm circular tattoe       Past Surgical History:        Procedure Laterality Date    APPENDECTOMY      as noted on CT done in 2022       Social History:    Social History     Tobacco Use    Smoking status: Never    Smokeless tobacco: Never    Tobacco comments:     Pt non smoker   Substance Use Topics    Alcohol use: Never                                Counseling given: Not Answered  Tobacco comments: Pt non smoker      Vital Signs (Current):   Vitals:    02/21/23 0943   BP: (!) 153/115   Pulse: 94   Resp: 24   Temp: 97.3 F (36.3 C)   TempSrc: Skin   SpO2:  93%                                              BP Readings from Last 3 Encounters:   02/21/23 121/75   02/21/23 (!) 153/115       NPO Status:                          Time of last solid consumption: 0800                                                 Date of last solid food consumption: 02/21/23    BMI:   Wt Readings from Last 3 Encounters:   02/21/23 69.6 kg (153 lb 7 oz)   02/16/23 68 kg (150 lb)   02/16/23 68 kg (150 lb)     There is no height or weight on file to calculate BMI.    CBC:   Lab Results   Component Value Date/Time    WBC 7.8 02/19/2023 04:06 AM    RBC 3.90 02/19/2023 04:06 AM    HGB 11.6 02/19/2023 04:06 AM    HCT 34.2 02/19/2023 04:06 AM    MCV 87.7 02/19/2023 04:06 AM    RDW 14.2 02/19/2023 04:06 AM    PLT 193 02/19/2023 04:06 AM       CMP:   Lab Results   Component Value Date/Time    NA 138 02/19/2023 04:06 AM    K 3.5 02/19/2023 04:06 AM    CL 110  02/19/2023 04:06 AM    CO2 23 02/19/2023 04:06 AM    BUN 14 02/19/2023 04:06 AM    CREATININE 0.73 02/19/2023 04:06 AM    LABGLOM >90 02/19/2023 04:06 AM    GLUCOSE 94 02/19/2023 04:06 AM    CALCIUM 9.0 02/19/2023 04:06 AM    BILITOT 0.4 02/18/2023 09:08 PM    ALKPHOS 74 02/18/2023 09:08 PM    AST 31 02/18/2023 09:08 PM    ALT 83 02/18/2023 09:08 PM       POC Tests: No results for input(s): "POCGLU", "POCNA", "POCK", "POCCL", "POCBUN", "POCHEMO", "POCHCT" in the last 72 hours.    Coags: No results found for: "PROTIME", "INR", "APTT"    HCG (If Applicable): No results found for: "PREGTESTUR", "PREGSERUM", "HCG", "HCGQUANT"     ABGs: No results found for: "PHART", "PO2ART", "PCO2ART", "HCO3ART", "BEART", "O2SATART"     Type & Screen (If Applicable):  No results found for: "LABABO"    Drug/Infectious Status (If Applicable):  No results found for: "HIV", "HEPCAB"    COVID-19 Screening (If Applicable): No results found for: "COVID19"        Anesthesia Evaluation  Patient summary reviewed and Nursing notes reviewed  Airway: Mallampati: II  TM distance: >3 FB   Neck ROM: full  Mouth opening: > = 3 FB   Dental: normal exam         Pulmonary: breath sounds clear to auscultation  (+)           asthma:                            Cardiovascular:  Exercise  tolerance: good (>4 METS)  (+) CHF:        Rhythm: regular  Rate: normal                    Neuro/Psych:   (+) psychiatric history:             ROS comment: Leukoencephalopathy  GI/Hepatic/Renal: Neg GI/Hepatic/Renal ROS            Endo/Other: Negative Endo/Other ROS                    Abdominal: normal exam            Vascular: negative vascular ROS.         Other Findings:             Anesthesia Plan      MAC     ASA 3       Induction: intravenous.    MIPS: Postoperative opioids intended and Prophylactic antiemetics administered.  Anesthetic plan and risks discussed with patient.    Use of blood products discussed with patient whom consented to blood products.    Plan  discussed with CRNA.                    Renee Ramus, MD   02/21/2023

## 2023-02-21 NOTE — OR Nursing (Signed)
Patient placed on angio table with max assistance. IR Staff and anesthesia present at bedside. Anesthesia to remain at bedside to manage pt airway, medications, VS and pt status.

## 2023-02-21 NOTE — Other (Addendum)
[facility] Liaison Team Note     LOS:  6 days     Patient goal(s) for today: Patient goal(s) for today: take medications as prescribed, make needs known in an appropriate manner  [facility] Liaison team focus/goals: assess needs, provide support and education, coordinate care     Progress note: Liaison attempted 2x to meet f/f with pt in his room on the medical floor at [facility]. Pt is off the floor to have PEG tube placed. Review of record indicates that pt placed in hospice care as of 02/20/2023.      Per hospitalist note on 02/20/2023: "42 y.o man with a history of CHF, HTN, polysubstance abuse including heroin and cocaine, ?schizophrenia, who was admitted to an outside behavioral health unit on May 1 for bizarre behavior and paranoia. He was transferred here for medical work-up due to lack of improvement at [facility]." Also per 02/20/2023 note: "Patient seen and examined. Unable to provide any history, does not look at me, does not track, does not follow any commands, does not answer any questions.  Wife at the bedside.  She would like to take him home with hospice, but is requesting a feeding tube prior to discharge.  Patient has accepted some liquids, but is not able to chew or swallow any solids."    Liaison met with primary nurse. She reports that pt in the process of having a PEG tube placed. She reports that pt has basically been disoriented and unable to communicate with staff. Pt reportedly responds sporadically with nods during interactions with his wife. Wife confirms that Hospice set up for pt at their home. Medicaid paperwork pending for home health care. Wife tells liaison that there are moments when pt can communicate with her by nodding, shaking his head, or laughing appropriately. She tells liaison that pt has been able to say a few words. He reportedly has been moving his arms and legs, but not in any deliberate fashion. Liaison provided supportive counseling, encouragement, and validation of feelings with wife.  Liaison will continue to monitor and to support.    Barriers to Discharge: medical clearance and discharge planning     Outpatient provider(s): none reported  Insurance info/prescription coverage:  Medicaid of [place]     Diagnosis: Per psychiatric consult note on 02/18/2023: "Polysubstance abuse disorder, ? Underlying schizophrenia?. Delirium secondary to either medical or mental/withdrawal underlying origin."    Dx From [facility]:   Major depressive disorder by history  Generalized anxiety disorder by history  Stimulant/cocaine use disorder severity unspecified  History of opiate use disorder  Rule out sedative-hypnotic use disorder  Rule out delirium    Plan: Discharge planning by case management to include home hospice. Please defer to most recent psychiatric consult note for recommendations.  Follow up Psych Consult placed? no.   Psychiatrist updated? no       Participating treatment team members: Adreon Wykes, Lurline Hare, LCSW

## 2023-02-21 NOTE — Care Coordination-Inpatient (Signed)
Transition of Care Plan:    Hospice - home; following PEG placement  - [facility] has accepted and can deliver DME when Pt spouse is home  - Home personal care - arranged through [employer] DSS - Personal care form completed and faxed to [facility] Medicaid [id number]     [facility] liaison, Jenel Lucks [phone]      RUR: 12%  Prior Level of Functioning: independent   Disposition: hospice  Follow up appointments: PCP, Psych, Neuro  DME needed: none   Transportation at discharge: see note below    Caregiver Contact: spouse Jatavis Schellinger [phone]  Discharge Caregiver contacted prior to discharge?   Care Conference needed? no  Barriers to discharge: medical        CM completed [employer] Request form to cover the cost of transportation for patient home. If patient leaves over the weekend, CM to schedule [facility] transport and bill it to the hospital.        [facility] Manager   [phone]

## 2023-02-21 NOTE — Progress Notes (Signed)
[facility]                                                                                          Hospitalist Progress Note  Lynden Ang, MD  Office Phone: [phone]        Date of Service:  02/21/2023  NAME:  [place] [facility]  DOB:  1980-11-12  MRN:  [id number]       Admission Summary:   42 y.o man with a history of CHF, HTN, polysubstance abuse including heroin and cocaine, ?schizophrenia, who was admitted to an outside behavioral health unit on May 1 for bizarre behavior and paranoia. He was transferred here for medical work-up due to lack of improvement at [facility].       Interval history / Subjective:   Patient seen and examined.    PEG to be placed today.    Discussed with wife at the bedside.      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 He is currently completely care-dependent, and would need a PEG. She said he wouldn't want that and prefers the comfort-care hospice route. Ideally, go to [facility] with hospice.  -5/16: IR consulted for PEG tube placement;  -5/17: PEG placed today, TFs to start tomorrow;     R index finger cellulitis w/ abscess:  -completed outpt abx    Polysubstance abuse  -Per wife patient was clean for about a year, recently relapsed;     Chronic HCV;          Code status: full  Prophylaxis: sc Lovenox  Care Plan discussed with: Patient's wife at the bedside, RN, CM;  Anticipated Disposition: Home with hospice after PEG tube placement;   -[place] lives in [place];  -Will plan for discharge on 5/19;     Inpatient  Cardiac  monitoring: Telemetry  Central Line:            Social Determinants of Health     Tobacco Use: Low Risk  (02/06/2023)    Patient History     Smoking Tobacco Use: Never     Smokeless Tobacco Use: Never     Passive Exposure: Not on file   Alcohol Use: Not At Risk (02/05/2023)    AUDIT-C     Frequency of Alcohol Consumption: Never     Average Number of Drinks: Patient does not drink     Frequency of Binge Drinking: Never   Financial Resource Strain: Not on file   Food Insecurity: Patient Unable To Answer (02/05/2023)    Hunger Vital Sign     Worried About Running Out of Food in the Last Year: Patient unable to answer     Ran Out of Food  in the Last Year: Patient unable to answer   Transportation Needs: Patient Unable To Answer (02/05/2023)    PRAPARE - Transportation     Lack of Transportation (Medical): Patient unable to answer     Lack of Transportation (Non-Medical): Patient unable to answer   Physical Activity: Not on file   Stress: Not on file   Social Connections: Not on file   Intimate Partner Violence: Not on file   Depression: Not at risk (02/05/2023)    PHQ-2     PHQ-2 Score: 0   Housing Stability: Patient Unable To Answer (02/05/2023)    Housing Stability Vital Sign     Unable to Pay for Housing in the Last Year: Patient unable to answer     Number of Places Lived in the Last Year: 0     Unstable Housing in the Last Year: Patient unable to answer   Interpersonal Safety: Patient Unable To Answer (02/05/2023)    Interpersonal Safety Domain Source: IP Abuse Screening     Physical abuse: Unable to assess     Verbal abuse: Unable to assess     Emotional abuse: Unable to assess      Financial abuse: Unable to assess      Sexual abuse: Unable to assess    Utilities: Patient Unable To Answer (02/05/2023)    [facility] Utilities     Threatened with loss of utilities: Patient unable to answer       Review of Systems:   Review of systems not obtained due to patient factors.       Vital Signs:    Last 24hrs VS reviewed since prior progress  note. Most recent are:  Vitals:    02/21/23 0549   BP:    Pulse: 89   Resp:    Temp:    SpO2:          Intake/Output Summary (Last 24 hours) at 02/21/2023 1213  Last data filed at 02/21/2023 0221  Gross per 24 hour   Intake 700 ml   Output --   Net 700 ml          Physical Examination:     I had a face to face encounter with this patient and independently examined them on 02/21/2023 as outlined below:          General : NAD, lethargic  HEENT: anicteric sclerae  Chest: Clear to auscultation bilaterally   CVS: S1 S2 heard, Capillary refill less than 2 seconds  Abd: soft/ non tender, non distended, BS physiological,   Ext: no clubbing, no cyanosis, no edema  Neuro/Psych: lethargic unable to participate in exam spastic extremities  Skin: warm     Data Review:    Review and/or order of clinical lab test  Review and/or order of tests in the radiology section of CPT  Review and/or order of tests in the medicine section of CPT      I have personally and independently reviewed all pertinent labs, diagnostic studies, imaging, and have provided independent interpretation of the same.     Labs:     Recent Labs     02/18/23  2108 02/19/23  0406   WBC 9.7 7.8   HGB 12.0* 11.6*   HCT 36.2* 34.2*   PLT 218 193       Recent Labs     02/18/23  2108 02/19/23  0406   NA 137 138   K 3.2* 3.5   CL 106 110*   CO2  23 23   BUN 15 14   MG 1.9  --    PHOS 2.9  --        Recent Labs     02/18/23  2108   ALT 83*   GLOB 3.7       No results for input(s): "INR", "APTT" in the last 72 hours.    Invalid input(s): "PTP"   No results for input(s): "TIBC" in the last 72 hours.    Invalid input(s): "FE", "PSAT", "FERR"   No results found for: "RBCF"   No results for input(s): "PH", "PCO2", "PO2" in the last 72 hours.  No results for input(s): "CPK" in the last 72 hours.    Invalid input(s): "CPKMB", "CKNDX", "TROIQ"  Lab Results   Component Value Date/Time    CHOL 143 02/06/2023 11:40 AM    HDL 38 02/06/2023 11:40 AM    LDL 88.4 02/06/2023 11:40 AM     No  results found for: "GLUCPOC"  @LABUA @    Notes reviewed from all clinical/nonclinical/nursing services involved in patient's clinical care. Care coordination discussions were held with appropriate clinical/nonclinical/ nursing providers based on care coordination needs.         Patients current active Medications were reviewed, considered, added and adjusted based on the clinical condition today.      Home Medications were reconciled to the best of my ability given all available resources at the time of admission. Route is PO if not otherwise noted.      Admission Status:[id number]:::1}      Medications Reviewed:     Current Facility-Administered Medications   Medication Dose Route Frequency    miconazole (MICOTIN) 2 % cream   Topical BID    baclofen (LIORESAL) tablet 2.5 mg  2.5 mg Oral TID PRN    gadoteridol (PROHANCE) injection 14 mL  14 mL IntraVENous ONCE PRN    sodium chloride flush 0.9 % injection 5-40 mL  5-40 mL IntraVENous BID    0.9 % sodium chloride infusion   IntraVENous Continuous    sodium chloride flush 0.9 % injection 5-40 mL  5-40 mL IntraVENous 2 times per day    sodium chloride flush 0.9 % injection 5-40 mL  5-40 mL IntraVENous PRN    0.9 % sodium chloride infusion   IntraVENous PRN    potassium chloride (KLOR-CON) extended release tablet 40 mEq  40 mEq Oral PRN    Or    potassium bicarb-citric acid (EFFER-K) effervescent tablet 40 mEq  40 mEq Oral PRN    Or    potassium chloride 10 mEq/100 mL IVPB (Peripheral Line)  10 mEq IntraVENous PRN    magnesium sulfate 2000 mg in 50 mL IVPB premix  2,000 mg IntraVENous PRN    enoxaparin (LOVENOX) injection 40 mg  40 mg SubCUTAneous Daily    ondansetron (ZOFRAN-ODT) disintegrating tablet 4 mg  4 mg Oral Q8H PRN    Or    ondansetron (ZOFRAN) injection 4 mg  4 mg IntraVENous Q6H PRN    polyethylene glycol (GLYCOLAX) packet 17 g  17 g Oral Daily PRN    acetaminophen (TYLENOL) tablet 650 mg  650 mg Oral Q6H PRN    Or    acetaminophen (TYLENOL) suppository 650 mg   650 mg Rectal Q6H PRN    multivitamin 1 tablet  1 tablet Oral Daily    buPROPion (WELLBUTRIN XL) extended release tablet 150 mg  150 mg Oral Daily    hydrOXYzine HCl (ATARAX) tablet 50 mg  50 mg Oral TID PRN     ______________________________________________________________________  EXPECTED LENGTH OF STAY: 7  ACTUAL LENGTH OF STAY:          6                 Lynden Ang, MD

## 2023-02-21 NOTE — Plan of Care (Signed)
Problem: Discharge Planning  Goal: Discharge to home or other facility with appropriate resources  Outcome: Progressing  Flowsheets (Taken 02/20/2023 2000)  Discharge to home or other facility with appropriate resources: Identify barriers to discharge with patient and caregiver     Problem: Pain  Goal: Verbalizes/displays adequate comfort level or baseline comfort level  Outcome: Progressing     Problem: Skin/Tissue Integrity  Goal: Absence of new skin breakdown  Description: 1.  Monitor for areas of redness and/or skin breakdown  2.  Assess vascular access sites hourly  3.  Every 4-6 hours minimum:  Change oxygen saturation probe site  4.  Every 4-6 hours:  If on nasal continuous positive airway pressure, respiratory therapy assess nares and determine need for appliance change or resting period.  Outcome: Progressing     Problem: Safety - Adult  Goal: Free from fall injury  Outcome: Progressing     Problem: Chronic Conditions and Co-morbidities  Goal: Patient's chronic conditions and co-morbidity symptoms are monitored and maintained or improved  Outcome: Progressing  Flowsheets (Taken 02/20/2023 2000)  Care Plan - Patient's Chronic Conditions and Co-Morbidity Symptoms are Monitored and Maintained or Improved: Monitor and assess patient's chronic conditions and comorbid symptoms for stability, deterioration, or improvement     Problem: SLP Adult - Impaired Swallowing  Goal: By Discharge: Advance to least restrictive diet without signs or symptoms of aspiration for planned discharge setting.  See evaluation for individualized goals.  02/20/2023 1428 by Harl Bowie, SLP  Outcome: Progressing

## 2023-02-21 NOTE — Progress Notes (Signed)
Per IR RN, pt can get meds through peg tube starting at 1400 today. Pt has to wait 24 hours until starting tube feeds. May start on 5/18 at 1030am.

## 2023-02-21 NOTE — Progress Notes (Signed)
Patient is currently off the floor for PEG placement. Then for discharge. On a pureed diet.   Dennie Fetters, SLP

## 2023-02-21 NOTE — Plan of Care (Signed)
Problem: Discharge Planning  Goal: Discharge to home or other facility with appropriate resources  02/21/2023 0919 by Gardiner Ramus, RN  Outcome: Progressing  02/21/2023 0239 by Vergie Living, RN  Outcome: Progressing  Flowsheets (Taken 02/20/2023 2000)  Discharge to home or other facility with appropriate resources: Identify barriers to discharge with patient and caregiver     Problem: Pain  Goal: Verbalizes/displays adequate comfort level or baseline comfort level  02/21/2023 0919 by Gardiner Ramus, RN  Outcome: Progressing  02/21/2023 0239 by Vergie Living, RN  Outcome: Progressing     Problem: Skin/Tissue Integrity  Goal: Absence of new skin breakdown  Description: 1.  Monitor for areas of redness and/or skin breakdown  2.  Assess vascular access sites hourly  3.  Every 4-6 hours minimum:  Change oxygen saturation probe site  4.  Every 4-6 hours:  If on nasal continuous positive airway pressure, respiratory therapy assess nares and determine need for appliance change or resting period.  02/21/2023 0919 by Gardiner Ramus, RN  Outcome: Progressing  02/21/2023 0239 by Vergie Living, RN  Outcome: Progressing     Problem: Safety - Adult  Goal: Free from fall injury  02/21/2023 0919 by Gardiner Ramus, RN  Outcome: Progressing  02/21/2023 0239 by Vergie Living, RN  Outcome: Progressing     Problem: Chronic Conditions and Co-morbidities  Goal: Patient's chronic conditions and co-morbidity symptoms are monitored and maintained or improved  02/21/2023 0919 by Gardiner Ramus, RN  Outcome: Progressing  02/21/2023 0239 by Vergie Living, RN  Outcome: Progressing  Flowsheets (Taken 02/20/2023 2000)  Care Plan - Patient's Chronic Conditions and Co-Morbidity Symptoms are Monitored and Maintained or Improved: Monitor and assess patient's chronic conditions and comorbid symptoms for stability, deterioration, or improvement     Problem: Nutrition Deficit:  Goal: Optimize nutritional status  Outcome: Progressing

## 2023-02-22 MED FILL — ENOXAPARIN SODIUM 40 MG/0.4ML IJ SOSY: 40 MG/0.4ML | INTRAMUSCULAR | Qty: 0.4

## 2023-02-22 MED FILL — THERA PO TABS: ORAL | Qty: 1

## 2023-02-22 MED FILL — BUPROPION HCL ER (XL) 150 MG PO TB24: 150 MG | ORAL | Qty: 1

## 2023-02-22 NOTE — Plan of Care (Signed)
Problem: Discharge Planning  Goal: Discharge to home or other facility with appropriate resources  Outcome: Progressing  Flowsheets (Taken 02/21/2023 2000)  Discharge to home or other facility with appropriate resources: Identify barriers to discharge with patient and caregiver     Problem: Pain  Goal: Verbalizes/displays adequate comfort level or baseline comfort level  Outcome: Progressing     Problem: Skin/Tissue Integrity  Goal: Absence of new skin breakdown  Description: 1.  Monitor for areas of redness and/or skin breakdown  2.  Assess vascular access sites hourly  3.  Every 4-6 hours minimum:  Change oxygen saturation probe site  4.  Every 4-6 hours:  If on nasal continuous positive airway pressure, respiratory therapy assess nares and determine need for appliance change or resting period.  Outcome: Progressing     Problem: Safety - Adult  Goal: Free from fall injury  Outcome: Progressing     Problem: Chronic Conditions and Co-morbidities  Goal: Patient's chronic conditions and co-morbidity symptoms are monitored and maintained or improved  Outcome: Progressing  Flowsheets (Taken 02/21/2023 2000)  Care Plan - Patient's Chronic Conditions and Co-Morbidity Symptoms are Monitored and Maintained or Improved: Monitor and assess patient's chronic conditions and comorbid symptoms for stability, deterioration, or improvement     Problem: Nutrition Deficit:  Goal: Optimize nutritional status  Outcome: Progressing

## 2023-02-22 NOTE — Progress Notes (Signed)
[name]                                                                                          Hospitalist Progress Note  Lynden Ang, MD  Office Phone: [phone]        Date of Service:  02/22/2023  NAME:  [place]  DOB:  12-May-1981  MRN:  [id number]       Admission Summary:   42 y.o man with a history of CHF, HTN, polysubstance abuse including heroin and cocaine, ?schizophrenia, who was admitted to an outside behavioral health unit on May 1 for bizarre behavior and paranoia. He was transferred here for medical work-up due to lack of improvement at [facility].       Interval history / Subjective:   Patient seen and examined.    PEG tube placed 5/17, tube feeds started today, tolerating so far.  Discussed with wife at the bedside.  Fluctuating level of alertness, but smiled at me when I walked into the room, and waved bye when I left, otherwise poorly interactive.  Likely discharge tomorrow.     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 He is currently completely care-dependent, and would need a PEG. She said he wouldn't want that and prefers the comfort-care hospice route. Ideally, go to [facility] with hospice.  -5/16: IR consulted for PEG tube placement;  -5/17: PEG placed today, TFs to start tomorrow;   -5/18: Tube feeds started, tolerating, dietitian consulted; will transition to bolus at discharge;     R index finger cellulitis w/ abscess:  -completed outpt abx    Polysubstance abuse  -Per wife patient was clean for about a year, recently  relapsed;     Chronic HCV;          Code status: full  Prophylaxis: sc Lovenox  Care Plan discussed with: Patient's wife at the bedside, RN, CM;  Anticipated Disposition: Home with hospice after PEG tube placement;   -Patient lives in [place];  -Will plan for discharge on 5/19;     Inpatient  Cardiac monitoring: Telemetry  Central Line:            Social Determinants of Health     Tobacco Use: Low Risk  (02/21/2023)    Patient History     Smoking Tobacco Use: Never     Smokeless Tobacco Use: Never     Passive Exposure: Not on file   Alcohol Use: Not At Risk (02/05/2023)    AUDIT-C     Frequency of Alcohol Consumption: Never     Average Number of Drinks: Patient does not drink     Frequency of Binge Drinking: Never   Financial  Resource Strain: Not on file   Food Insecurity: Patient Unable To Answer (02/05/2023)    Hunger Vital Sign     Worried About Running Out of Food in the Last Year: Patient unable to answer     Ran Out of Food in the Last Year: Patient unable to answer   Transportation Needs: Patient Unable To Answer (02/05/2023)    PRAPARE - Transportation     Lack of Transportation (Medical): Patient unable to answer     Lack of Transportation (Non-Medical): Patient unable to answer   Physical Activity: Not on file   Stress: Not on file   Social Connections: Not on file   Intimate Partner Violence: Not on file   Depression: Not at risk (02/05/2023)    PHQ-2     PHQ-2 Score: 0   Housing Stability: Patient Unable To Answer (02/05/2023)    Housing Stability Vital Sign     Unable to Pay for Housing in the Last Year: Patient unable to answer     Number of Places Lived in the Last Year: 0     Unstable Housing in the Last Year: Patient unable to answer   Interpersonal Safety: Patient Unable To Answer (02/05/2023)    Interpersonal Safety Domain Source: IP Abuse Screening     Physical abuse: Unable to assess     Verbal abuse: Unable to assess     Emotional abuse: Unable to assess      Financial abuse: Unable to assess       Sexual abuse: Unable to assess    Utilities: Patient Unable To Answer (02/05/2023)    [facility]HC Utilities     Threatened with loss of utilities: Patient unable to answer       Review of Systems:   Review of systems not obtained due to patient factors.       Vital Signs:    Last 24hrs VS reviewed since prior progress note. Most recent are:  Vitals:    02/22/23 1800   BP: (!) 150/93   Pulse: 89   Resp: 18   Temp: 98.1 F (36.7 C)   SpO2: 91%         Intake/Output Summary (Last 24 hours) at 02/22/2023 1854  Last data filed at 02/22/2023 1839  Gross per 24 hour   Intake 100 ml   Output 1350 ml   Net -1250 ml          Physical Examination:     I had a face to face encounter with this patient and independently examined them on 02/22/2023 as outlined below:          General : NAD, lethargic  HEENT: anicteric sclerae  Chest: Clear to auscultation bilaterally   CVS: S1 S2 heard, Capillary refill less than 2 seconds  Abd: soft/ non tender, non distended, BS physiological,   Ext: no clubbing, no cyanosis, no edema  Neuro/Psych: lethargic unable to participate in exam spastic extremities  Skin: warm     Data Review:    Review and/or order of clinical lab test  Review and/or order of tests in the radiology section of CPT  Review and/or order of tests in the medicine section of CPT      I have personally and independently reviewed all pertinent labs, diagnostic studies, imaging, and have provided independent interpretation of the same.     Labs:     No results for input(s): "WBC", "HGB", "HCT", "PLT" in the last 72 hours.    No  results for input(s): "NA", "K", "CL", "CO2", "BUN", "GLU", "MG", "PHOS" in the last 72 hours.    Invalid input(s): "CREA", "CA", "URICA"    No results for input(s): "ALT", "TP", "GLOB", "GGT" in the last 72 hours.    Invalid input(s): "SGOT", "GPT", "AP", "TBIL", "TBILI", "ALB", "AML", "AMYP", "LPSE", "HLPSE"    No results for input(s): "INR", "APTT" in the last 72 hours.    Invalid input(s): "PTP"   No results for  input(s): "TIBC" in the last 72 hours.    Invalid input(s): "FE", "PSAT", "FERR"   No results found for: "RBCF"   No results for input(s): "PH", "PCO2", "PO2" in the last 72 hours.  No results for input(s): "CPK" in the last 72 hours.    Invalid input(s): "CPKMB", "CKNDX", "TROIQ"  Lab Results   Component Value Date/Time    CHOL 143 02/06/2023 11:40 AM    HDL 38 02/06/2023 11:40 AM    LDL 88.4 02/06/2023 11:40 AM     No results found for: "GLUCPOC"  @LABUA @    Notes reviewed from all clinical/nonclinical/nursing services involved in patient's clinical care. Care coordination discussions were held with appropriate clinical/nonclinical/ nursing providers based on care coordination needs.         Patients current active Medications were reviewed, considered, added and adjusted based on the clinical condition today.      Home Medications were reconciled to the best of my ability given all available resources at the time of admission. Route is PO if not otherwise noted.      Admission Status:[id number]:::1}      Medications Reviewed:     Current Facility-Administered Medications   Medication Dose Route Frequency    miconazole (MICOTIN) 2 % cream   Topical BID    baclofen (LIORESAL) tablet 2.5 mg  2.5 mg Oral TID PRN    gadoteridol (PROHANCE) injection 14 mL  14 mL IntraVENous ONCE PRN    sodium chloride flush 0.9 % injection 5-40 mL  5-40 mL IntraVENous BID    0.9 % sodium chloride infusion   IntraVENous Continuous    sodium chloride flush 0.9 % injection 5-40 mL  5-40 mL IntraVENous 2 times per day    sodium chloride flush 0.9 % injection 5-40 mL  5-40 mL IntraVENous PRN    0.9 % sodium chloride infusion   IntraVENous PRN    potassium chloride (KLOR-CON) extended release tablet 40 mEq  40 mEq Oral PRN    Or    potassium bicarb-citric acid (EFFER-K) effervescent tablet 40 mEq  40 mEq Oral PRN    Or    potassium chloride 10 mEq/100 mL IVPB (Peripheral Line)  10 mEq IntraVENous PRN    magnesium sulfate 2000 mg in 50 mL IVPB  premix  2,000 mg IntraVENous PRN    enoxaparin (LOVENOX) injection 40 mg  40 mg SubCUTAneous Daily    ondansetron (ZOFRAN-ODT) disintegrating tablet 4 mg  4 mg Oral Q8H PRN    Or    ondansetron (ZOFRAN) injection 4 mg  4 mg IntraVENous Q6H PRN    polyethylene glycol (GLYCOLAX) packet 17 g  17 g Oral Daily PRN    acetaminophen (TYLENOL) tablet 650 mg  650 mg Oral Q6H PRN    Or    acetaminophen (TYLENOL) suppository 650 mg  650 mg Rectal Q6H PRN    multivitamin 1 tablet  1 tablet Oral Daily    buPROPion (WELLBUTRIN XL) extended release tablet 150 mg  150 mg Oral Daily  hydrOXYzine HCl (ATARAX) tablet 50 mg  50 mg Oral TID PRN     ______________________________________________________________________  EXPECTED LENGTH OF STAY: 7  ACTUAL LENGTH OF STAY:          7                 [place], MD

## 2023-02-22 NOTE — Plan of Care (Signed)
Problem: Discharge Planning  Goal: Discharge to home or other facility with appropriate resources  02/22/2023 1459 by Blaine Hamper, RN  Outcome: Progressing  Flowsheets (Taken 02/22/2023 0830)  Discharge to home or other facility with appropriate resources:   Identify barriers to discharge with patient and caregiver   Identify discharge learning needs (meds, wound care, etc)   Arrange for needed discharge resources and transportation as appropriate  02/22/2023 0524 by Vergie Living, RN  Outcome: Progressing  Flowsheets (Taken 02/21/2023 2000)  Discharge to home or other facility with appropriate resources: Identify barriers to discharge with patient and caregiver     Problem: Pain  Goal: Verbalizes/displays adequate comfort level or baseline comfort level  02/22/2023 1459 by Blaine Hamper, RN  Outcome: Progressing  02/22/2023 0524 by Vergie Living, RN  Outcome: Progressing     Problem: Skin/Tissue Integrity  Goal: Absence of new skin breakdown  Description: 1.  Monitor for areas of redness and/or skin breakdown  2.  Assess vascular access sites hourly  3.  Every 4-6 hours minimum:  Change oxygen saturation probe site  4.  Every 4-6 hours:  If on nasal continuous positive airway pressure, respiratory therapy assess nares and determine need for appliance change or resting period.  02/22/2023 1459 by Blaine Hamper, RN  Outcome: Progressing  02/22/2023 0524 by Vergie Living, RN  Outcome: Progressing     Problem: Safety - Adult  Goal: Free from fall injury  02/22/2023 1459 by Blaine Hamper, RN  Outcome: Progressing  02/22/2023 0524 by Vergie Living, RN  Outcome: Progressing     Problem: Chronic Conditions and Co-morbidities  Goal: Patient's chronic conditions and co-morbidity symptoms are monitored and maintained or improved  02/22/2023 1459 by Blaine Hamper, RN  Outcome: Progressing  Flowsheets (Taken 02/22/2023 0830)  Care Plan - Patient's Chronic Conditions and Co-Morbidity Symptoms are  Monitored and Maintained or Improved:   Monitor and assess patient's chronic conditions and comorbid symptoms for stability, deterioration, or improvement   Collaborate with multidisciplinary team to address chronic and comorbid conditions and prevent exacerbation or deterioration   Update acute care plan with appropriate goals if chronic or comorbid symptoms are exacerbated and prevent overall improvement and discharge  02/22/2023 0524 by Vergie Living, RN  Outcome: Progressing  Flowsheets (Taken 02/21/2023 2000)  Care Plan - Patient's Chronic Conditions and Co-Morbidity Symptoms are Monitored and Maintained or Improved: Monitor and assess patient's chronic conditions and comorbid symptoms for stability, deterioration, or improvement     Problem: Nutrition Deficit:  Goal: Optimize nutritional status  02/22/2023 1459 by Blaine Hamper, RN  Outcome: Progressing  02/22/2023 0524 by Vergie Living, RN  Outcome: Progressing

## 2023-02-23 LAB — CBC
Hematocrit: 34.1 % — ABNORMAL LOW (ref 36.6–50.3)
Hemoglobin: 11.4 g/dL — ABNORMAL LOW (ref 12.1–17.0)
MCH: 29.5 PG (ref 26.0–34.0)
MCHC: 33.4 g/dL (ref 30.0–36.5)
MCV: 88.1 FL (ref 80.0–99.0)
MPV: 9.8 FL (ref 8.9–12.9)
Nucleated RBCs: 0 PER 100 WBC
Platelets: 254 10*3/uL (ref 150–400)
RBC: 3.87 M/uL — ABNORMAL LOW (ref 4.10–5.70)
RDW: 13.7 % (ref 11.5–14.5)
WBC: 5.6 10*3/uL (ref 4.1–11.1)
nRBC: 0 10*3/uL (ref 0.00–0.01)

## 2023-02-23 LAB — BASIC METABOLIC PANEL
Anion Gap: 7 mmol/L (ref 5–15)
BUN/Creatinine Ratio: 13 (ref 12–20)
BUN: 8 MG/DL (ref 6–20)
CO2: 26 mmol/L (ref 21–32)
Calcium: 9 MG/DL (ref 8.5–10.1)
Chloride: 107 mmol/L (ref 97–108)
Creatinine: 0.62 MG/DL — ABNORMAL LOW (ref 0.70–1.30)
Est, Glom Filt Rate: >90 mL/min/{1.73_m2} (ref >60)
Glucose: 116 mg/dL — ABNORMAL HIGH (ref 65–100)
Potassium: 3.6 mmol/L (ref 3.5–5.1)
Sodium: 140 mmol/L (ref 136–145)

## 2023-02-23 LAB — PHOSPHORUS: Phosphorus: 3.3 MG/DL (ref 2.6–4.7)

## 2023-02-23 LAB — MAGNESIUM: Magnesium: 2 mg/dL (ref 1.6–2.4)

## 2023-02-23 MED ORDER — COQ10 100 MG PO CAPS
100 | ORAL_CAPSULE | Freq: Every day | ORAL | 0 refills | Status: AC
Start: 2023-02-23 — End: 2023-03-25

## 2023-02-23 MED ORDER — VITAMIN B-1 100 MG PO TABS
100 | ORAL_TABLET | Freq: Every day | ORAL | 0 refills | Status: AC
Start: 2023-02-23 — End: ?

## 2023-02-23 MED ORDER — BUPROPION HCL ER (XL) 150 MG PO TB24
150 | ORAL_TABLET | Freq: Every day | ORAL | 0 refills | Status: AC
Start: 2023-02-23 — End: 2023-03-26

## 2023-02-23 MED ORDER — MULTIPLE VITAMINS PO TABS
Freq: Every day | ORAL | 0 refills | Status: AC
Start: 2023-02-23 — End: ?

## 2023-02-23 MED ORDER — BUPROPION HCL ER (XL) 150 MG PO TB24
150 | ORAL_TABLET | Freq: Every day | ORAL | 0 refills | Status: DC
Start: 2023-02-23 — End: 2023-02-23

## 2023-02-23 MED ORDER — BACLOFEN 5 MG PO TABS
5 | ORAL_TABLET | Freq: Three times a day (TID) | ORAL | 0 refills | Status: AC | PRN
Start: 2023-02-23 — End: 2023-03-25

## 2023-02-23 MED FILL — ENOXAPARIN SODIUM 40 MG/0.4ML IJ SOSY: 40 MG/0.4ML | INTRAMUSCULAR | Qty: 0.4

## 2023-02-23 MED FILL — BUPROPION HCL ER (XL) 150 MG PO TB24: 150 MG | ORAL | Qty: 1

## 2023-02-23 MED FILL — THERA PO TABS: ORAL | Qty: 1

## 2023-02-23 NOTE — Progress Notes (Signed)
Peg tube teaching and bathing gone over with wife. Wife did tube flushing, brief change and cleaning. No further questions at time of discharge.

## 2023-02-23 NOTE — Plan of Care (Signed)
Problem: Discharge Planning  Goal: Discharge to home or other facility with appropriate resources  02/23/2023 1303 by Eliezer Bottom, RN  Outcome: Adequate for Discharge  Flowsheets (Taken 02/23/2023 0800)  Discharge to home or other facility with appropriate resources: Identify barriers to discharge with patient and caregiver  02/23/2023 0049 by Jerral Bonito, RN  Outcome: Progressing     Problem: Discharge Planning  Goal: Discharge to home or other facility with appropriate resources  02/23/2023 1303 by Eliezer Bottom, RN  Outcome: Adequate for Discharge  Flowsheets (Taken 02/23/2023 0800)  Discharge to home or other facility with appropriate resources: Identify barriers to discharge with patient and caregiver  02/23/2023 0049 by Jerral Bonito, RN  Outcome: Progressing     Problem: Pain  Goal: Verbalizes/displays adequate comfort level or baseline comfort level  02/23/2023 1303 by Eliezer Bottom, RN  Outcome: Adequate for Discharge  02/23/2023 0049 by Jerral Bonito, RN  Outcome: Progressing     Problem: Skin/Tissue Integrity  Goal: Absence of new skin breakdown  Description: 1.  Monitor for areas of redness and/or skin breakdown  2.  Assess vascular access sites hourly  3.  Every 4-6 hours minimum:  Change oxygen saturation probe site  4.  Every 4-6 hours:  If on nasal continuous positive airway pressure, respiratory therapy assess nares and determine need for appliance change or resting period.  Outcome: Adequate for Discharge     Problem: Safety - Adult  Goal: Free from fall injury  02/23/2023 1303 by Eliezer Bottom, RN  Outcome: Adequate for Discharge  02/23/2023 0049 by Jerral Bonito, RN  Outcome: Progressing     Problem: Chronic Conditions and Co-morbidities  Goal: Patient's chronic conditions and co-morbidity symptoms are monitored and maintained or improved  Outcome: Adequate for Discharge  Flowsheets (Taken 02/23/2023 0800)  Care Plan - Patient's Chronic Conditions and Co-Morbidity Symptoms are Monitored and  Maintained or Improved: Monitor and assess patient's chronic conditions and comorbid symptoms for stability, deterioration, or improvement     Problem: Nutrition Deficit:  Goal: Optimize nutritional status  Outcome: Adequate for Discharge

## 2023-02-23 NOTE — Discharge Summary (Signed)
Discharge Summary       PATIENT ID: Lucas Hicks  MRN: [id number]   DATE OF BIRTH: January 22, 1981    DATE OF ADMISSION: 02/15/2023  6:18 PM    DATE OF DISCHARGE: 02/23/2023   PRIMARY CARE PROVIDER: No primary care provider on file.     DISCHARGING PROVIDER: Lynden Ang, MD    To contact this individual call [phone] and ask the operator to page.  If unavailable ask to be transferred the Adult Hospitalist Department.    CONSULTATIONS: IP CONSULT TO NEUROLOGY  IP CONSULT TO PSYCHIATRY  IP CONSULT TO PSYCHIATRY  IP CONSULT TO HOSPICE  IP WOUND CARE NURSE CONSULT TO EVAL  IP CONSULT TO DIETITIAN  IP CONSULT TO CASE MANAGEMENT  IP CONSULT TO DIETITIAN  IP CONSULT TO DIETITIAN  IP CONSULT TO DIETITIAN    PROCEDURES/SURGERIES: * No surgery found *     ADMITTING DIAGNOSES & HOSPITAL COURSE:   HPI:  Patient is a 42 yo M 42 y.o man with a PMHx of HTN, and polysubstance misuse disorder including heroin and cocaine, possibly schizophrenia, who was admitted to an outside behavioral health unit on May 1 for bizarre behavior and paranoia.  Due to lack of improvement at [facility], he was transferred to [facility] for medical work-up and evaluation by Neurology.      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 based on MRI findings;  -EEG without seizure activity, severe encephalopathy;  -neurology and psychiatry consulted - signed off  -poor prognosis, started MVN per neuro, rec CoQ10 on discharge, PRN baclofen for spasticity  -Patient's wife has decided to take the patient home with Hospice, but has requested a PEG prior to discharge;  arrangements have been made, with challenges on the way as [place];   -5/16: IR consulted for PEG tube placement;  -5/17: PEG placed today, TFs to start tomorrow;   -5/18: Tube feeds started, tolerating, dietitian consulted; will transition to bolus feeds at discharge;   -continue puree diet;      R index finger  cellulitis w/ abscess:  -completed outpt abx     Polysubstance misuse disorder:  -Per wife patient was clean for about a year, recently relapsed;   -recommend complete abstinence from any illicit substances;   -no signs of acute withdrawal at the time of discharge;     Chronic HCV:  -follow-up with PCP and/or Hepatology;         DISCHARGE DIAGNOSES / PLAN:      DISCHARGE DIAGNOSES   Toxic metabolic leukoencephalopathy;  Dysphagia     Patient was discharged to home in stable condition;           PENDING TEST RESULTS:   At the time of discharge the following test results are still pending: None    FOLLOW UP APPOINTMENTS:    Follow-up Information       Follow up With Specialties Details Why Contact Info    [facility]  Follow up in 44 day(s)  Liaison: Roberta [phone]              FOLLOW UP APPOINTMENTS:   PCP in 1-2 weeks for general medical follow-up and medications refills;   Neurology in 3-4 weeks;     ADDITIONAL CARE RECOMMENDATIONS:   Please take all your medications as prescribed;    DIET: puree diet  Tube feeds:  Jevity 1.5 - 240 ml bolus x 5 feedings/day, flush with 75 ml  before and after each bolus;   Aspiration precautions;  Bowel regimen;    ACTIVITY: activity as tolerated  Fall precautions    WOUND CARE: n/a    EQUIPMENT needed: Per Hospice;      DISCHARGE MEDICATIONS:     Medication List        START taking these medications      Baclofen 5 MG tablet  Commonly known as: LIORESAL  Take 0.5 tablets by mouth 3 times daily as needed (spasticity)     buPROPion 150 MG extended release tablet  Commonly known as: WELLBUTRIN XL  Take 1 tablet by mouth daily  Start taking on: Feb 24, 2023     CoQ10 100 MG Caps  Take 300 mg by mouth daily     multivitamin Tabs tablet  Take 1 tablet by mouth daily  Start taking on: Feb 24, 2023     vitamin B-1 100 MG tablet  Commonly known as: THIAMINE  Take 1 tablet by mouth daily            CONTINUE taking these medications      alfuzosin 10 MG extended release  tablet  Commonly known as: UROXATRAL     Ventolin HFA 108 (90 Base) MCG/ACT inhaler  Generic drug: albuterol sulfate HFA            STOP taking these medications      lamoTRIgine 200 MG tablet  Commonly known as: LAMICTAL     OLANZapine zydis 10 MG disintegrating tablet  Commonly known as: ZYPREXA               Where to Get Your Medications        Information about where to get these medications is not yet available    Ask your nurse or doctor about these medications  Baclofen 5 MG tablet  buPROPion 150 MG extended release tablet  CoQ10 100 MG Caps  multivitamin Tabs tablet  vitamin B-1 100 MG tablet           NOTIFY YOUR PHYSICIAN FOR ANY OF THE FOLLOWING:   Fever over 101 degrees for 24 hours.   Chest pain, shortness of breath, fever, chills, nausea, vomiting, diarrhea, change in mentation, falling, weakness, bleeding. Severe pain or pain not relieved by medications.  Or, any other signs or symptoms that you may have questions about.    DISPOSITION:   x Home With:   OT  PT  HH  RN       Long term SNF/Inpatient Rehab    Independent/assisted living   x Hospice    Other:       PATIENT CONDITION AT DISCHARGE:     Functional status   x Poor     Deconditioned     Independent      Cognition     Lucid     Forgetful     Dementia      Catheters/lines (plus indication)    Foley     PICC    x PEG     None      Code status    x Full code     DNR      PHYSICAL EXAMINATION AT DISCHARGE:  General : NAD, intermittently interactive, delayed responses;   HEENT: anicteric sclerae, EOM grossly intact, does not follow commands consistently;  Chest: Clear to auscultation bilaterally, no rales, rhonchi or wheezing;  CVS: S1 S2 heard, RRR  Abd: soft/ non tender, non distended, BS physiological, PEG tube LUQ, site without erythema, drainage or leakage;   Ext: no clubbing, no cyanosis, no edema  Neuro/Psych: awake, occasionally answers a question with a word or two, follows some commands  intermittently;  Skin: warm       CHRONIC MEDICAL DIAGNOSES:  Principal Problem:    AMS (altered mental status)  Active Problems:    Leukoencephalopathy  Resolved Problems:    * No resolved hospital problems. *        Greater than 31 minutes were spent with the patient on counseling and coordination of care    Signed:   Lynden Ang, MD  02/23/2023  10:15 AM

## 2023-02-23 NOTE — Care Coordination-Inpatient (Addendum)
TOC:    CM completed chart review, previous cm contacted [employer] to provide payment for transport to [facility] home with [facility]. Cm spoke with Cheree Ditto at [facility], [phone]. Transport time scheduled for 1pm, pending respone from [employer], [phone]. IF they are available for him to come today, cm will change transport time to 12:00 p.m. RN aware of this plan. Patient wife at bedside.    Previous cm obtained quote from [facility] for stretcher transport - [phone]. Plan to apply for assistance from [employer].       [facility] can see the patient on Monday. Wife and MD agreeable to discharge today. Hospital to Home arrive transport scheduled for 12pm.     Ella Jubilee, [facility]  Care Management Department  [phone]

## 2023-02-23 NOTE — Plan of Care (Signed)
Problem: Discharge Planning  Goal: Discharge to home or other facility with appropriate resources  02/23/2023 0049 by Jerral Bonito, RN  Outcome: Progressing  02/22/2023 1459 by Blaine Hamper, RN  Outcome: Progressing  Flowsheets (Taken 02/22/2023 0830)  Discharge to home or other facility with appropriate resources:   Identify barriers to discharge with patient and caregiver   Identify discharge learning needs (meds, wound care, etc)   Arrange for needed discharge resources and transportation as appropriate     Problem: Pain  Goal: Verbalizes/displays adequate comfort level or baseline comfort level  02/23/2023 0049 by Jerral Bonito, RN  Outcome: Progressing  02/22/2023 1459 by Blaine Hamper, RN  Outcome: Progressing     Problem: Skin/Tissue Integrity  Goal: Absence of new skin breakdown  Description: 1.  Monitor for areas of redness and/or skin breakdown  2.  Assess vascular access sites hourly  3.  Every 4-6 hours minimum:  Change oxygen saturation probe site  4.  Every 4-6 hours:  If on nasal continuous positive airway pressure, respiratory therapy assess nares and determine need for appliance change or resting period.  02/22/2023 1459 by Blaine Hamper, RN  Outcome: Progressing     Problem: Safety - Adult  Goal: Free from fall injury  02/23/2023 0049 by Jerral Bonito, RN  Outcome: Progressing  02/22/2023 1459 by Blaine Hamper, RN  Outcome: Progressing     Problem: Chronic Conditions and Co-morbidities  Goal: Patient's chronic conditions and co-morbidity symptoms are monitored and maintained or improved  02/22/2023 1459 by Blaine Hamper, RN  Outcome: Progressing  Flowsheets (Taken 02/22/2023 0830)  Care Plan - Patient's Chronic Conditions and Co-Morbidity Symptoms are Monitored and Maintained or Improved:   Monitor and assess patient's chronic conditions and comorbid symptoms for stability, deterioration, or improvement   Collaborate with multidisciplinary team to address chronic and comorbid  conditions and prevent exacerbation or deterioration   Update acute care plan with appropriate goals if chronic or comorbid symptoms are exacerbated and prevent overall improvement and discharge

## 2023-02-23 NOTE — Discharge Instructions (Addendum)
Discharge Instructions       PATIENT ID: Lucas Hicks  MRN: [id number]   DATE OF BIRTH: [birth year]    DATE OF ADMISSION: 02/15/2023   DATE OF DISCHARGE: 02/23/2023    PRIMARY CARE PROVIDER: No primary care provider on file.     ATTENDING PHYSICIAN: Lynden Ang, MD   DISCHARGING PROVIDER: Lynden Ang, MD    To contact this individual call [phone] and ask the operator to page.   If unavailable ask to be transferred the Adult Hospitalist Department.    DISCHARGE DIAGNOSES   Toxic metabolic leukoencephalopathy;  Dysphagia     CONSULTATIONS:   Neurology    PROCEDURES/SURGERIES: * No surgery found *    PENDING TEST RESULTS:   At the time of discharge the following test results are still pending: None    FOLLOW UP APPOINTMENTS:   PCP in 1-2 weeks for general medical follow-up and medications refills;   Neurology in 3-4 weeks;     ADDITIONAL CARE RECOMMENDATIONS:   Please take all your medications as prescribed;    DIET:  puree diet  Tube feeds:  Jevity 1.5 - 240 ml bolus x 5 feedings/day, flush with 75 ml before and after each bolus;   Aspiration precautions;  Bowel regimen;    ACTIVITY: activity as tolerated  Fall precautions    WOUND CARE: n/a    EQUIPMENT needed: Per Hospice;      DISCHARGE MEDICATIONS:   See Medication Reconciliation Form    It is important that you take the medication exactly as they are prescribed.   Keep your medication in the bottles provided by the pharmacist and keep a list of the medication names, dosages, and times to be taken in your wallet.   Do not take other medications without consulting your doctor.       NOTIFY YOUR PHYSICIAN FOR ANY OF THE FOLLOWING:   Fever over 101 degrees for 24 hours.   Chest pain, shortness of breath, fever, chills, nausea, vomiting, diarrhea, change in mentation, falling, weakness, bleeding. Severe pain or pain not relieved by medications.  Or, any other signs or symptoms that you may have questions about.      DISPOSITION:   x Home With:   OT  PT   HH  RN       SNF/Inpatient Rehab/LTAC    Independent/assisted living   x Hospice    Other:     CDMP Checked:   Yes IK     PROBLEM LIST Updated:  Yes IK       Signed:   Lynden Ang, MD  02/23/2023  9:56 AM

## 2023-04-02 ENCOUNTER — Emergency Department (HOSPITAL_COMMUNITY)
Admission: EM | Admit: 2023-04-02 | Discharge: 2023-04-02 | Disposition: A | Discharge status: Home / Self Care | Payer: Medicaid Other | Attending: Emergency Medicine | Admitting: Emergency Medicine

## 2023-04-02 ENCOUNTER — Encounter (HOSPITAL_COMMUNITY): Payer: Self-pay

## 2023-04-02 DIAGNOSIS — I110 Hypertensive heart disease with heart failure: Secondary | ICD-10-CM | POA: Insufficient documentation

## 2023-04-02 DIAGNOSIS — J45909 Unspecified asthma, uncomplicated: Secondary | ICD-10-CM | POA: Diagnosis not present

## 2023-04-02 DIAGNOSIS — Z4659 Encounter for fitting and adjustment of other gastrointestinal appliance and device: Secondary | ICD-10-CM | POA: Diagnosis not present

## 2023-04-02 DIAGNOSIS — Z8673 Personal history of transient ischemic attack (TIA), and cerebral infarction without residual deficits: Secondary | ICD-10-CM | POA: Diagnosis not present

## 2023-04-02 DIAGNOSIS — T85848A Pain due to other internal prosthetic devices, implants and grafts, initial encounter: Secondary | ICD-10-CM | POA: Diagnosis present

## 2023-04-02 DIAGNOSIS — I509 Heart failure, unspecified: Secondary | ICD-10-CM | POA: Insufficient documentation

## 2023-04-02 DIAGNOSIS — Z431 Encounter for attention to gastrostomy: Secondary | ICD-10-CM

## 2023-04-02 DIAGNOSIS — Z79899 Other long term (current) drug therapy: Secondary | ICD-10-CM | POA: Insufficient documentation

## 2023-04-02 LAB — CBC WITH DIFFERENTIAL/PLATELET
Abs Immature Granulocytes: 0.02 10*3/uL (ref 0.00–0.07)
Basophils Absolute: 0 10*3/uL (ref 0.0–0.1)
Basophils Relative: 1 %
Eosinophils Absolute: 0.1 10*3/uL (ref 0.0–0.5)
Eosinophils Relative: 2 %
HCT: 43 % (ref 39.0–52.0)
Hemoglobin: 13.6 g/dL (ref 13.0–17.0)
Immature Granulocytes: 0 %
Lymphocytes Relative: 30 %
Lymphs Abs: 1.7 10*3/uL (ref 0.7–4.0)
MCH: 28.6 pg (ref 26.0–34.0)
MCHC: 31.6 g/dL (ref 30.0–36.0)
MCV: 90.3 fL (ref 80.0–100.0)
Monocytes Absolute: 0.5 10*3/uL (ref 0.1–1.0)
Monocytes Relative: 8 %
Neutro Abs: 3.4 10*3/uL (ref 1.7–7.7)
Neutrophils Relative %: 59 %
Platelets: 248 10*3/uL (ref 150–400)
RBC: 4.76 MIL/uL (ref 4.22–5.81)
RDW: 14.8 % (ref 11.5–15.5)
WBC: 5.7 10*3/uL (ref 4.0–10.5)
nRBC: 0 % (ref 0.0–0.2)

## 2023-04-02 LAB — COMPREHENSIVE METABOLIC PANEL
ALT: 25 U/L (ref 0–44)
AST: 25 U/L (ref 15–41)
Albumin: 3.5 g/dL (ref 3.5–5.0)
Alkaline Phosphatase: 60 U/L (ref 38–126)
Anion gap: 11 (ref 5–15)
BUN: 11 mg/dL (ref 6–20)
CO2: 27 mmol/L (ref 22–32)
Calcium: 9.9 mg/dL (ref 8.9–10.3)
Chloride: 103 mmol/L (ref 98–111)
Creatinine, Ser: 0.79 mg/dL (ref 0.61–1.24)
GFR, Estimated: >60 mL/min (ref >60)
Glucose, Bld: 97 mg/dL (ref 70–99)
Potassium: 4.2 mmol/L (ref 3.5–5.1)
Sodium: 141 mmol/L (ref 135–145)
Total Bilirubin: 0.6 mg/dL (ref 0.3–1.2)
Total Protein: 6.6 g/dL (ref 6.5–8.1)

## 2023-04-02 LAB — LIPASE, BLOOD: Lipase: 33 U/L (ref 11–51)

## 2023-04-02 NOTE — ED Provider Notes (Signed)
[place] EMERGENCY DEPARTMENT AT [facility] Provider Note   CSN: [id number] Arrival date & time: 04/02/23  0820     History  No chief complaint on file.   Randall Parsons is a 42 y.o. male.  HPI Patient presents with pain at his PEG tube site.  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  Has PEG tube in place.  Reportedly was told he was going to be a vegetable.  Patient is on hospice however patient is actually improving rapidly.  Patient's mother has power of attorney.  States that hospice at the tube can come out but they cannot do it.  Was told to follow-up with GI.  Tube has been in for close to 2 months.  Patient eats and the tube was only flushed.   Past Medical History:  Diagnosis Date   Anxiety    Asthma    CHF (congestive heart failure) (HCC)    Depression    Exposure to hepatitis B    Exposure to hepatitis C    Hepatitis C    History of kidney stones    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, ending around 2017   Hypertension    IV drug abuse (HCC)    Myocardial infarction [facility])    Renal disorder    kidney stones   Stroke [facility])     Home Medications Prior to Admission medications   Medication Sig Start Date End Date Taking? Authorizing Provider  albuterol (VENTOLIN HFA) 108 (90 Base) MCG/ACT inhaler Inhale 2 puffs into the lungs every 6 (six) hours as needed for wheezing or shortness of breath (shortness of breath or wheezing). 02/02/23   Ghimire, Werner Lean, MD  lamoTRIgine (LAMICTAL) 200 MG tablet Take 1 tablet (200 mg total) by mouth daily. 02/02/23   Ghimire, Werner Lean, MD  OLANZapine zydis (ZYPREXA) 10 MG disintegrating tablet Take 1 tablet (10 mg total) by mouth at bedtime. 02/03/23   Russella Dar, NP  oxyCODONE (OXY IR/ROXICODONE) 5 MG immediate release tablet Take 1 tablet (5 mg total) by mouth every 6 (six) hours as needed for moderate pain. 02/02/23   Ghimire, Werner Lean, MD  polyethylene  glycol (MIRALAX / GLYCOLAX) 17 g packet Take 17 g by mouth daily. 02/02/23   Ghimire, Werner Lean, MD  pantoprazole (PROTONIX) 40 MG tablet Take 1 tablet (40 mg total) by mouth daily. 07/17/19 08/03/19  Clapacs, Jackquline Denmark, MD      Allergies    Amoxicillin, Fish allergy, and Prednisone    Review of Systems   Review of Systems  Physical Exam Updated Vital Signs BP 101/65   Pulse 64   Temp 98 F (36.7 C) (Oral)   Resp 16   SpO2 100%  Physical Exam Vitals and nursing note reviewed.  HENT:     Head: Normocephalic.  Abdominal:     Comments: PEG tube left upper abdomen.  Mild erythema.  No real tenderness either at the site or diffusely.  Neurological:     Mental Status: He is alert. Mental status is at baseline.     Comments: Post head injury.  Some memory issues.  Some contractions.     ED Results / Procedures / Treatments   Labs (all labs ordered are listed, but only abnormal results are displayed) Labs Reviewed  CBC WITH DIFFERENTIAL/PLATELET  COMPREHENSIVE METABOLIC PANEL  LIPASE, BLOOD    EKG None  Radiology No results  found.  Procedures Procedures    Medications Ordered in ED Medications - No data to display  ED Course/ Medical Decision Making/ A&P                             Medical Decision Making Amount and/or Complexity of Data Reviewed Labs: ordered.  Patient with abdominal pain at PEG tube site.  Will get basic blood work.  Patient's PEG tube was not used and mother asks about removal and GI.  Will get basic blood work.  Doubt severe infection at the site.  Reviewed admission note from 2 months ago, although was prior to the anoxic injury.  Blood work reassuring.  PEG tube removed without difficulty.  Dressings given.  Outpatient follow-up with GI and hospice.  Discharge home.         Final Clinical Impression(s) / ED Diagnoses Final diagnoses:  Pain around PEG tube site, initial encounter  PEG (percutaneous endoscopic gastrostomy)  adjustment/replacement/removal [facility])    Rx / DC Orders ED Discharge Orders     None         Benjiman Core, MD 04/02/23 1626

## 2023-04-02 NOTE — ED Triage Notes (Signed)
Pt BIB EMS from home with c/o of pain at feeding tube site for the past week. Pt reports it is tender to touch  114/70 97% room air HR 64

## 2023-04-02 NOTE — ED Notes (Signed)
Paperwork provided to [employer]. Pt DC

## 2023-04-02 NOTE — ED Notes (Signed)
Got patient into a gown on the monitor patient is resting with call bell in reach 

## 2023-04-02 NOTE — ED Notes (Signed)
Wife, Leia Alf, left to prepare the house for patient's return.  She requested that the sheet, blanket, chucks and pillow return home with the patient.  Please also call her when patient is picked up by [facility] at [phone].

## 2023-04-21 ENCOUNTER — Ambulatory Visit: Payer: MEDICAID | Admitting: Physical Therapy

## 2023-04-21 ENCOUNTER — Other Ambulatory Visit: Payer: Self-pay

## 2023-04-21 ENCOUNTER — Ambulatory Visit: Payer: MEDICAID | Attending: Family Medicine | Admitting: Occupational Therapy

## 2023-04-21 ENCOUNTER — Encounter: Payer: Self-pay | Admitting: Physical Therapy

## 2023-04-21 DIAGNOSIS — R2681 Unsteadiness on feet: Secondary | ICD-10-CM

## 2023-04-21 DIAGNOSIS — R4189 Other symptoms and signs involving cognitive functions and awareness: Secondary | ICD-10-CM | POA: Diagnosis present

## 2023-04-21 DIAGNOSIS — R278 Other lack of coordination: Secondary | ICD-10-CM

## 2023-04-21 DIAGNOSIS — G929 Unspecified toxic encephalopathy: Secondary | ICD-10-CM | POA: Diagnosis present

## 2023-04-21 DIAGNOSIS — R41841 Cognitive communication deficit: Secondary | ICD-10-CM | POA: Insufficient documentation

## 2023-04-21 DIAGNOSIS — R2689 Other abnormalities of gait and mobility: Secondary | ICD-10-CM

## 2023-04-21 DIAGNOSIS — M6281 Muscle weakness (generalized): Secondary | ICD-10-CM | POA: Insufficient documentation

## 2023-04-21 DIAGNOSIS — R531 Weakness: Secondary | ICD-10-CM | POA: Diagnosis present

## 2023-04-21 DIAGNOSIS — R262 Difficulty in walking, not elsewhere classified: Secondary | ICD-10-CM | POA: Insufficient documentation

## 2023-04-21 NOTE — Therapy (Addendum)
OUTPATIENT OCCUPATIONAL THERAPY NEURO EVALUATION  Patient Name: Randall Parsons MRN: [id number] DOB:09-08-1981, 42 y.o., male Today's Date: 04/22/2023  PCP: Dr. Cheri Kearns REFERRING PROVIDER: Hillery Aldo  END OF SESSION:  OT End of Session - 04/21/23 1122     Visit Number 1    Number of Visits 24    Date for OT Re-Evaluation 07/14/23    OT Start Time 0930    OT Stop Time 1015    OT Time Calculation (min) 45 min    Activity Tolerance Patient tolerated treatment well    Behavior During Therapy [facility] for tasks assessed/performed             Past Medical History:  Diagnosis Date   Anxiety    Asthma    CHF (congestive heart failure) (HCC)    Depression    Exposure to hepatitis B    Exposure to hepatitis C    Hepatitis C    History of kidney stones    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, ending around 2017   Hypertension    IV drug abuse (HCC)    Myocardial infarction (HCC)    Renal disorder    kidney stones   Stroke [facility])    Past Surgical History:  Procedure Laterality Date   APPENDECTOMY     EYE SURGERY     secondary to dog bite   I & D EXTREMITY Right 01/31/2023   Procedure: IRRIGATION AND DEBRIDEMENT INDEX FINGER;  Surgeon: Betha Loa, MD;  Location: [facility] OR;  Service: Orthopedics;  Laterality: Right;   TIBIA FRACTURE SURGERY Right    Patient Active Problem List   Diagnosis Date Noted   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/01/2023   Abscess of finger 01/31/2023   Hypokalemia 01/31/2023   Paranoid delusion (HCC) 01/31/2023   History of hepatitis C 01/31/2023   Toxic encephalopathy 01/10/2023   MDD (major depressive disorder), recurrent episode, severe (HCC) 12/22/2021   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) 12/18/2021   Left wrist pain 08/27/2021   Bronchospasm 02/18/2021   COVID-19 vaccine dose declined 12/20/2020   Hepatitis C test positive 12/20/2020   Prolactin  increased 12/20/2020   Advance care planning 12/20/2020   GAD (generalized anxiety disorder) 04/04/2020   Attention deficit hyperactivity disorder, combined type 04/04/2020   Opioid type dependence, continuous (HCC) 04/04/2020   Major depressive disorder 07/14/2019   Alcohol abuse 07/14/2019   Cocaine abuse (HCC) 07/12/2019   Low back pain 11/28/2017   NICM (nonischemic cardiomyopathy) (HCC) 11/28/2017   Renal stone 11/28/2017   History of seizure 03/14/2017   IVDU (intravenous drug user) 03/14/2017   Other specified abnormal immunological findings in serum 12/21/2015    ONSET DATE: 01/11/2023  REFERRING DIAG: Toxic encephalopathy  THERAPY DIAG:  Muscle weakness (generalized)  Other lack of coordination  Impaired cognition  Rationale for Evaluation and Treatment: Rehabilitation  SUBJECTIVE:   SUBJECTIVE STATEMENT: Pt. reports that he is doing well today. Pt. Reports that he has difficulties completing and thinking through certain tasks such as making a bowl of cereal.  Pt accompanied by: significant other  PERTINENT HISTORY: Pt. Is a 42 y.o. male who was admitted to the hospital on 01/10/2023 due to being found at a hotel unresponsive and apneic with pinpoint pupil. Pt. Was intubated for airway protection. Pt. Was admitted with acute hypoxic respiratory failure secondary to aspiration pneumonia, pulmonary edema, NSTEMI, and acute toxic encephalopathy cause by  recreational drug overdose. Pt. has a history of polysubstance abuse, Hep C, IVDA, depression, ETOH, substance induced mood disorder.   PRECAUTIONS: No drinking with a straw due to swallowing issues  WEIGHT BEARING RESTRICTIONS: No  PAIN:  Are you having pain? Pt. Reports he is experiencing 5/10 pain today in his neck and lower back.   FALLS: Has patient fallen in last 6 months? No  LIVING ENVIRONMENT: Lives with: lives with their family Lives in: House/apartment Stairs: Yes: Internal: 15 steps; on left going up  however Pt. Reports he does not go upstairs very often because his bedroom is on main level, 1 external step to get into the house with no rails Has following equipment at home: None  PLOF: Independent  PATIENT GOALS: Completing tasks such as making a bowl of cereal, balance, making the bed, using the phone  OBJECTIVE:   HAND DOMINANCE: Right  ADLs: Eating: Increased time, Has to think through the tasks when completing them now Grooming: Able to use razor safely however requires increased time to think through UB Dressing: Able to put shirt on however sometimes experiences difficulties when putting his arms through the holes LB Dressing: Able to put pants on however sometimes puts pants on backwards and requires him to switch them to proper position.  Toileting: Able to sit down on toilet and clean self however, struggles getting up by your self at times, however wife has not helped in a week with tolieting and reports he has done well. Bathing: Independent, sometimes have difficulties remembering what they washed  Tub Shower transfers: Independent Equipment:  None  IADLs: Shopping: Wife mostly does grocery shopping, Pt. Will sometime goes with spouse Light housekeeping: Difficulty making up the bed Meal Prep: Wife does most of the cooking, pt. Able to prepare light meal Community mobility: Towards end of day Medication management: Not taking any medicine currently Financial management: Wife does Handwriting: 100% legible  MOBILITY STATUS: Independent  POSTURE COMMENTS:  No Significant postural limitations Sitting balance:  WFL  ACTIVITY TOLERANCE: Activity tolerance: WFL  FUNCTIONAL OUTCOME MEASURES: FOTO: 63 TR:63  UPPER EXTREMITY ROM:    Active ROM Right WFL Left WFL  Shoulder flexion    Shoulder abduction    Shoulder adduction    Shoulder extension    Shoulder internal rotation    Shoulder external rotation    Elbow flexion    Elbow extension    Wrist  flexion    Wrist extension    Wrist ulnar deviation    Wrist radial deviation    Wrist pronation    Wrist supination    (Blank rows = not tested)  UPPER EXTREMITY MMT:     MMT Right eval Left eval  Shoulder flexion 4+/5 4/5  Shoulder abduction 4+/5 4/5  Shoulder adduction    Shoulder extension    Shoulder internal rotation    Shoulder external rotation    Middle trapezius    Lower trapezius    Elbow flexion 5/5 4+/5  Elbow extension 5/5 4+/5  Wrist flexion 5/5 4/5  Wrist extension 5/5 4/5  Wrist ulnar deviation    Wrist radial deviation    Wrist pronation    Wrist supination    (Blank rows = not tested)  HAND FUNCTION: Grip strength: Right: 50 lbs; Left: 36 lbs, Lateral pinch: Right: 15 lbs, Left: 13 lbs, and 3 point pinch: Right: 15 lbs, Left: 10 lbs  COORDINATION: 9 Hole Peg test: Right: 28 sec; Left: 32 sec  SENSATION:To be assessed  further in functional contexts   EDEMA: None  MUSCLE TONE: None  COGNITION: Overall cognitive status: Impaired  VISION: Subjective report: Pt. Reports vision has gotten worse since the brain damage and unable to read his bible sometimes in church. Pt. Was advised to go to eye doctor due to visual changes present. Baseline vision: Wears glasses for reading only Visual history: brain injury  VISION ASSESSMENT: To be further assessed in functional context  Patient has difficulty with following activities due to following visual impairments: Reading his bible at church  PERCEPTION: To be assessed further in functional contexts  PRAXIS: WFL  TODAY'S TREATMENT:                                                                                                                              DATE: 04/21/2023  Measurements were obtained and goals were established  PATIENT EDUCATION: Education details: POC, OT services, Goals Person educated: Patient and Spouse Education method: Explanation, Demonstration, and Verbal cues Education  comprehension: verbalized understanding  HOME EXERCISE PROGRAM: Will continue ongoing assessment of HEP needs and provide as indicated.    GOALS: Goals reviewed with patient? Yes  SHORT TERM GOALS: Target date: 07/03/2023  Pt. Will improve FOTO score by 2 points to reflect improved perceived function performance in specific ADLS/IADLs.  Baseline: Eval: FOTO: 63 TR: 63 Goal status: INITIAL   LONG TERM GOALS: Target date: 07/14/2023  Pt. Will improve L shoulder strength by 1 mm grade to be able to sustain UE elevation while completing overhead reaching tasks.  Baseline: EVAL: L: Shoulder flexion: 4/5, Shoulder abduction: 4/5 Goal status: INITIAL  2.  Pt will increase L hand grip strength by 5# to be able to securely hold objects, open containers, and jars. Baseline: Eval: Grip strength: Right: 50 lbs; Left: 36 lbs Goal status: INITIAL  3.  Pt. Will independently make up his bed following the proper sequence of  steps needed to complete the task 100% of the time.  Baseline: Eval: Pt. requires increased time, and assist to sequence through the steps of bed making tasks. Goal status: INITIAL  4.  Pt. Will improve [facility] skills by 2 seconds of speed to independently manipulate small objects during ADL/IADL tasks.  Baseline: Eval: 9 Hole Peg test: Right: 28 sec; Left: 32 sec Goal status: INITIAL  5. Pt. will independently identify, or demonstrate compensatory strategies for donning UE, and LE clothing in the correct direction. Baseline: Eval: Pt. Puts clothing on backwards, inside out, on a different body part, or out of sequence. Goal status: INITIAL  ASSESSMENT:  CLINICAL IMPRESSION:  Patient is a 42 y.o. male who was seen today for occupational therapy evaluation for toxic encephalopathy. Pt. presents with decreased LUE strength, grip strength, coordination, and impaired cognition impacting his ability to engage in daily tasks including: performing home management tasks, reaching  overhead, opening containers, manipulating small objects, and sequencing during ADLs, and IADL tasks. Pt. FOTO score was  63. Pt. would benefit from skilled OT services to improve sequencing skills, [facility] skills, and LUE strength to increase independence in ADL/IADL tasks.   PERFORMANCE DEFICITS: in functional skills including ADLs, IADLs, coordination, strength, pain, Fine motor control, Gross motor control, balance, and UE functional use, cognitive skills including sequencing, and psychosocial skills including coping strategies, environmental adaptation, and routines and behaviors.   IMPAIRMENTS: are limiting patient from ADLs, IADLs, and work.   CO-MORBIDITIES: may have co-morbidities  that affects occupational performance. Patient will benefit from skilled OT to address above impairments and improve overall function.  MODIFICATION OR ASSISTANCE TO COMPLETE EVALUATION: No modification of tasks or assist necessary to complete an evaluation.  OT OCCUPATIONAL PROFILE AND HISTORY: Detailed assessment: Review of records and additional review of physical, cognitive, psychosocial history related to current functional performance.  CLINICAL DECISION MAKING: Moderate - several treatment options, min-mod task modification necessary  REHAB POTENTIAL: Good  EVALUATION COMPLEXITY: Moderate    PLAN:  OT FREQUENCY: 2x/week  OT DURATION: 12 weeks  PLANNED INTERVENTIONS: self care/ADL training, therapeutic exercise, therapeutic activity, neuromuscular re-education, patient/family education, and cognitive remediation/compensation  RECOMMENDED OTHER SERVICES: ST, PT  CONSULTED AND AGREED WITH PLAN OF CARE: Patient and family member/caregiver  PLAN FOR NEXT SESSION: Initiate treatment  Herma Carson, OTS  This entire session was performed under the direct supervision and direction of a licensed therapist. I have personally read, edited, and approve of the note as written.   Olegario Messier, MS,  OTR/L 04/22/2023, 10:28 AM

## 2023-04-21 NOTE — Therapy (Unsigned)
OUTPATIENT PHYSICAL THERAPY NEURO EVALUATION   Patient Name: Randall Parsons MRN: [id number] DOB:October 15, 1980, 41 y.o., male Today's Date: 04/21/2023   PCP: Alease Medina, MD  REFERRING PROVIDER: Alease Medina, MD   END OF SESSION:  PT End of Session - 04/21/23 1019     Visit Number 1    Number of Visits 24    Date for PT Re-Evaluation 07/14/23    PT Start Time 1020    PT Stop Time 1103    PT Time Calculation (min) 43 min    Equipment Utilized During Treatment Gait belt    Activity Tolerance Patient tolerated treatment well             Past Medical History:  Diagnosis Date   Anxiety    Asthma    CHF (congestive heart failure) (HCC)    Depression    Exposure to hepatitis B    Exposure to hepatitis C    Hepatitis C    History of kidney stones    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, ending around 2017   Hypertension    IV drug abuse (HCC)    Myocardial infarction (HCC)    Renal disorder    kidney stones   Stroke [facility])    Past Surgical History:  Procedure Laterality Date   APPENDECTOMY     EYE SURGERY     secondary to dog bite   I & D EXTREMITY Right 01/31/2023   Procedure: IRRIGATION AND DEBRIDEMENT INDEX FINGER;  Surgeon: Betha Loa, MD;  Location: [facility] OR;  Service: Orthopedics;  Laterality: Right;   TIBIA FRACTURE SURGERY Right    Patient Active Problem List   Diagnosis Date Noted   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/01/2023   Abscess of finger 01/31/2023   Hypokalemia 01/31/2023   Paranoid delusion (HCC) 01/31/2023   History of hepatitis C 01/31/2023   Toxic encephalopathy 01/10/2023   MDD (major depressive disorder), recurrent episode, severe (HCC) 12/22/2021   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) 12/18/2021   Left wrist pain 08/27/2021   Bronchospasm 02/18/2021   COVID-19 vaccine dose declined 12/20/2020   Hepatitis C test positive 12/20/2020   Prolactin  increased 12/20/2020   Advance care planning 12/20/2020   GAD (generalized anxiety disorder) 04/04/2020   Attention deficit hyperactivity disorder, combined type 04/04/2020   Opioid type dependence, continuous (HCC) 04/04/2020   Major depressive disorder 07/14/2019   Alcohol abuse 07/14/2019   Cocaine abuse (HCC) 07/12/2019   Low back pain 11/28/2017   NICM (nonischemic cardiomyopathy) (HCC) 11/28/2017   Renal stone 11/28/2017   History of seizure 03/14/2017   IVDU (intravenous drug user) 03/14/2017   Other specified abnormal immunological findings in serum 12/21/2015    ONSET DATE: 01/10/2023  REFERRING DIAG: G92.9 (ICD-10-CM) - Unspecified toxic encephalopathy   THERAPY DIAG:  Balance disorder  Unsteadiness on feet  Weakness generalized  Rationale for Evaluation and Treatment: Rehabilitation  SUBJECTIVE:  SUBJECTIVE STATEMENT: Pt come to PT to "work on Body" and try to "get back to normal". States that he had an overdose sever months ago, and just wants to get back to moving like he did before the incident   Pt accompanied by: significant other "[name]"   PERTINENT HISTORY: Pt admitted to hospital after He was found at a hotel on 4/5 by staff unresponsive, apneic with pinpoint pupils. He was given Narcan in the field, after this became combative and staff was unable to redirect him. On arrival to the ER he remained combative, his jaw was clenched, he was hypoxic, and ultimately intubated for airway protection. Admitted with Acute hypoxic respiratory failure secondary to aspiration pneumonia, pulmonary edema, NSTEMI, in the setting of acute toxic encephalopathy caused by recreational drug overdose, history of IVDA in the past. At hospital d/c on 4/13 on room air, will be discharged on rescue inhaler  3 more days of oral Levaquin with outpatient PCP follow-up.  Pt has has 2 subsequent ED visits for finger Abscess 01/2023 and Peg tub removal on 6/26 due to pain at peg site. From ED visit "PEG tube removed without difficulty. Dressings given. Outpatient follow-up with GI and hospice. Discharge home."  Per PT referral, pt has continued to have BLE with intermittent difficulty walking as well as getting up from toilet.   PAIN:  Are you having pain? Yes: NPRS scale: 5/10 Pain location: Neck  Pain description: stiffness  Aggravating factors: sleeping  Relieving factors: Chiropractor   PRECAUTIONS: Fall  RED FLAGS: None   WEIGHT BEARING RESTRICTIONS: No  FALLS: Has patient fallen in last 6 months? No  LIVING ENVIRONMENT: Lives with: lives with their family and lives with their spouse Lives in: House/apartment Stairs: Yes: Internal: flight  steps; on left going up and External: 1 steps; none Has following equipment at home:  has walker but does not use it  and None  PLOF: Independent and Independent with basic ADLs  PATIENT GOALS: get back to "normal" get back to work if able. Improve balance    OBJECTIVE:   DIAGNOSTIC FINDINGS:  EXAM: CT HEAD WITHOUT CONTRAST IMPRESSION: 1.  No evidence of an acute intracranial abnormality. 2. Chronic right basal ganglia lacunar infarct.    COGNITION: Overall cognitive status: Impaired confusion -    SENSATION: WFL  COORDINATION: Ankle to knee - mild dysmetria on the LLE Finger to nose - undershooting to the LUE    EDEMA:  WFL  MUSCLE TONE: WFL  MUSCLE LENGTH: WFL  DTRs:  Patella 2+ = Normal  POSTURE: rounded shoulders, forward head, and increased lumbar lordosis  LOWER EXTREMITY MMT      MMT  Right Eval Left Eval  Hip flexion 5 4+  Hip extension 5 4+  Hip abduction 5 5  Hip adduction 5 5  Hip internal rotation    Hip external rotation    Knee flexion 5 5  Knee extension 5 4+  Ankle dorsiflexion 5 4+  Ankle  plantarflexion    Ankle inversion    Ankle eversion     (Blank rows = not tested)  LOWER EXTREMITY MMT:    Grossly WFL  BED MOBILITY:  Sit to supine Complete Independence Supine to sit Complete Independence  TRANSFERS: Assistive device utilized: None  Sit to stand: Complete Independence Stand to sit: Complete Independence Chair to chair: Complete Independence Floor:  to be assessed   RAMP:  Level of Assistance: Complete Independence Assistive device utilized: None    CURB:  Level of Assistance: SBA Assistive device utilized: None Curb Comments: pt reports feeling more comfortable with rails   STAIRS: Level of Assistance: Modified independence Stair Negotiation Technique: Alternating Pattern  with Bilateral Rails Number of Stairs: 4  Height of Stairs: 6  Comments: pt reports feeling more confident with rails   GAIT: Gait pattern: ataxic and lateral hip instability Distance walked: 117ft Assistive device utilized: None Level of assistance: Modified independence Comments: mild L hip weakness and ataxia on the LLE   FUNCTIONAL TESTS:  5 times sit to stand: 9.39 Timed up and go (TUG): 12sec 6 minute walk test: TBD 10 meter walk test: normal 0.32m/s fast:o.45m/s  Berg Balance Scale: 49 Dynamic Gait Index: TBD  PATIENT SURVEYS:  FOTO 61  TODAY'S TREATMENT:                                                                                                                              DATE:   PT EVALUATION    [facility] PT Assessment - 04/21/23 0001       Berg Balance Test   Sit to Stand Able to stand without using hands and stabilize independently    Standing Unsupported Able to stand safely 2 minutes    Sitting with Back Unsupported but Feet Supported on Floor or Stool Able to sit safely and securely 2 minutes    Stand to Sit Sits safely with minimal use of hands    Transfers Able to transfer safely, minor use of hands    Standing Unsupported with Eyes Closed Able  to stand 10 seconds safely    Standing Unsupported with Feet Together Able to place feet together independently and stand for 1 minute with supervision    From Standing, Reach Forward with Outstretched Arm Can reach forward >12 cm safely (5")    From Standing Position, Pick up Object from Floor Able to pick up shoe safely and easily    From Standing Position, Turn to Look Behind Over each Shoulder Turn sideways only but maintains balance    Turn 360 Degrees Able to turn 360 degrees safely one side only in 4 seconds or less    Standing Unsupported, Alternately Place Feet on Step/Stool Able to stand independently and safely and complete 8 steps in 20 seconds    Standing Unsupported, One Foot in Front Able to plae foot ahead of the other independently and hold 30 seconds    Standing on One Leg Able to lift leg independently and hold 5-10 seconds    Total Score 49      Dynamic Gait Index   Level Surface Normal    Change in Gait Speed Mild Impairment    Gait with Horizontal Head Turns Mild Impairment    Gait with Vertical Head Turns Mild Impairment    Gait and Pivot Turn Normal    Step Over Obstacle Mild Impairment    Step Around Obstacles Mild Impairment    Steps Mild  Impairment    Total Score 18                PATIENT EDUCATION: Education details: POC, Clinic Orientation. PT evaluation  Person educated: Patient and Spouse Education method: Explanation and Demonstration Education comprehension: verbalized understanding  HOME EXERCISE PROGRAM: To be given at next apooint   GOALS: Goals reviewed with patient? Yes   SHORT TERM GOALS: Target date: 05/27/2023    Patient will be independent in home exercise program to improve strength/mobility for better functional independence with ADLs. Baseline: to be given at next session  Goal status: INITIAL   LONG TERM GOALS: Target date: 07/14/2023    Patient will increase FOTO score to equal to or greater than 65    to demonstrate  statistically significant improvement in mobility and quality of life.  Baseline: 61 Goal status: INITIAL  2. Pt will increase 6 min walk test by >281ft to demonstrate reduced fall risk and improved access to community.   Baseline: to be completed at next visit  Goal status : Initial    3.  Patient will increase Berg Balance score by > 6 points to demonstrate decreased fall risk during functional activities Baseline: 49 Goal status: INITIAL  4.  Patient will increase 10 meter walk test to >1.71m/s as to improve gait speed for better community ambulation and to reduce fall risk. Baseline: 0.47m/s Goal status: INITIAL  5.  Patient will reduce timed up and go to <11 seconds to reduce fall risk and demonstrate improved transfer/gait ability. Baseline: 12sec Goal status: INITIAL  6.  Patient will increase dynamic gait index score to >19/24 as to demonstrate reduced fall risk and improved dynamic gait balance for better safety with community/home ambulation.   Baseline: 18 Goal status: INITIAL   ASSESSMENT:  CLINICAL IMPRESSION: Patient is a 42 y.o. Male who was seen today for physical therapy evaluation and treatment for balance and coordination deficits following anoxic brain injury and encephalopathy from overdose. Pt demonstrates mild LLE weakness, as well as dysmetria and ataxia on the LLE. Balance deficits demonstrated with Berg 49/56 and DGI of 18. Pt will benefit from skilled PT to address strength, balance and coordination deficits to improve overall QoL and allow return to PLOF.  Pt also demonstrates cognitive deficits that limit safe functional mobility and would benefit from SLP evaluation.     OBJECTIVE IMPAIRMENTS: Abnormal gait, cardiopulmonary status limiting activity, decreased balance, decreased cognition, decreased coordination, decreased endurance, decreased knowledge of condition, decreased knowledge of use of DME, decreased mobility, decreased strength, decreased safety  awareness, impaired vision/preception, improper body mechanics, and postural dysfunction.   ACTIVITY LIMITATIONS: carrying, lifting, standing, squatting, stairs, and locomotion level  PARTICIPATION LIMITATIONS: cleaning, laundry, medication management, personal finances, driving, shopping, community activity, occupation, and yard work  PERSONAL FACTORS: Age, Behavior pattern, Education, and Fitness are also affecting patient's functional outcome.   REHAB POTENTIAL: Excellent  CLINICAL DECISION MAKING: Stable/uncomplicated  EVALUATION COMPLEXITY: Moderate  PLAN:  PT FREQUENCY: 1-2x/week  PT DURATION: 12 weeks  PLANNED INTERVENTIONS: Therapeutic exercises, Therapeutic activity, Neuromuscular re-education, Balance training, Gait training, Patient/Family education, Self Care, Joint mobilization, Stair training, Vestibular training, DME instructions, Cognitive remediation, Electrical stimulation, Wheelchair mobility training, Cryotherapy, Moist heat, scar mobilization, Splintting, Taping, Biofeedback, Manual therapy, and Re-evaluation  PLAN FOR NEXT SESSION:   6 minute walk test.  Balance HEP.  Visual assessment.    Golden Pop, PT 04/21/2023, 10:25 AM

## 2023-04-22 NOTE — Addendum Note (Signed)
Addended by: Avon Gully on: 04/22/2023 11:04 AM   Modules accepted: Orders

## 2023-04-23 ENCOUNTER — Ambulatory Visit: Payer: MEDICAID | Admitting: Physical Therapy

## 2023-04-25 ENCOUNTER — Ambulatory Visit: Payer: MEDICAID | Admitting: Speech Pathology

## 2023-04-25 DIAGNOSIS — R41841 Cognitive communication deficit: Secondary | ICD-10-CM

## 2023-04-25 DIAGNOSIS — G929 Unspecified toxic encephalopathy: Secondary | ICD-10-CM

## 2023-04-25 DIAGNOSIS — M6281 Muscle weakness (generalized): Secondary | ICD-10-CM | POA: Diagnosis not present

## 2023-04-25 NOTE — Therapy (Signed)
OUTPATIENT SPEECH LANGUAGE PATHOLOGY  COGNITION EVALUATION   Patient Name: Randall Parsons MRN: [id number] DOB:1981-01-24, 42 y.o., male Today's Date: 04/25/2023  PCP: [facility] REFERRING PROVIDER: Cheri Kearns, MD   End of Session - 04/25/23 0813     Visit Number 1    Number of Visits 25    Date for SLP Re-Evaluation 07/18/23    Authorization Type [employer]    Progress Note Due on Visit 10    SLP Start Time 0800    SLP Stop Time  0845    SLP Time Calculation (min) 45 min    Activity Tolerance Patient tolerated treatment well             Past Medical History:  Diagnosis Date   Anxiety    Asthma    CHF (congestive heart failure) (HCC)    Depression    Exposure to hepatitis B    Exposure to hepatitis C    Hepatitis C    History of kidney stones    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, ending around 2017   Hypertension    IV drug abuse (HCC)    Myocardial infarction (HCC)    Renal disorder    kidney stones   Stroke [facility])    Past Surgical History:  Procedure Laterality Date   APPENDECTOMY     EYE SURGERY     secondary to dog bite   I & D EXTREMITY Right 01/31/2023   Procedure: IRRIGATION AND DEBRIDEMENT INDEX FINGER;  Surgeon: Betha Loa, MD;  Location: [facility] OR;  Service: Orthopedics;  Laterality: Right;   TIBIA FRACTURE SURGERY Right    Patient Active Problem List   Diagnosis Date Noted   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/01/2023   Abscess of finger 01/31/2023   Hypokalemia 01/31/2023   Paranoid delusion (HCC) 01/31/2023   History of hepatitis C 01/31/2023   Toxic encephalopathy 01/10/2023   MDD (major depressive disorder), recurrent episode, severe (HCC) 12/22/2021   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) 12/18/2021   Left wrist pain 08/27/2021   Bronchospasm 02/18/2021   COVID-19 vaccine dose declined 12/20/2020   Hepatitis C test positive  12/20/2020   Prolactin increased 12/20/2020   Advance care planning 12/20/2020   GAD (generalized anxiety disorder) 04/04/2020   Attention deficit hyperactivity disorder, combined type 04/04/2020   Opioid type dependence, continuous (HCC) 04/04/2020   Major depressive disorder 07/14/2019   Alcohol abuse 07/14/2019   Cocaine abuse (HCC) 07/12/2019   Low back pain 11/28/2017   NICM (nonischemic cardiomyopathy) (HCC) 11/28/2017   Renal stone 11/28/2017   History of seizure 03/14/2017   IVDU (intravenous drug user) 03/14/2017   Other specified abnormal immunological findings in serum 12/21/2015    ONSET DATE: 01/10/2023; date of referral 04/24/2023   REFERRING DIAG: G92.9 (ICD-10-CM) - Unspecified toxic encephalopathy   THERAPY DIAG:  Cognitive communication deficit  Unspecified toxic encephalopathy  Rationale for Evaluation and Treatment Rehabilitation  SUBJECTIVE:   SUBJECTIVE STATEMENT: Pt pleasant, accompanied by his wife Pt accompanied by: significant other  PERTINENT HISTORY: Pt is a right handed 42 year old male with past medical history of polysubstance abuse, Hep C, IVDA, depression, ETOH, substance induced mood disorder who has history of hypoxic respiratory failure  and toxic encephalopathy cause by multiple drug overdoses. After discharging from [facility] on 02/02/2023 he was hospitalized in [place] and in a coma for multiple days (per his  wife's report).  DIAGNOSTIC FINDINGS:  Head CT 01/10/2023 1.  No evidence of an acute intracranial abnormality. 2. Chronic right basal ganglia lacunar infarct.  PAIN:  Are you having pain? No   FALLS: Has patient fallen in last 6 months?  No  LIVING ENVIRONMENT: Lives with: lives with their family Lives in: House/apartment  PLOF:  Level of assistance: Needed assistance with ADLs, Needed assistance with IADLS Employment: Other: not employed at this time   PATIENT GOALS   to get his ability to perform tasks  back  OBJECTIVE:   COGNITIVE COMMUNICATION Overall cognitive status: Impaired Areas of impairment:  Oriented x 4 Attention: Impaired: Selective Memory: Impaired: Immediate Working Short term Designer, jewellery Awareness: Impaired: Emergent and Anticipatory Executive function: Impaired: Comment: all impaired by lower level deficits Behavior: Within functional limits Impaired Functional Impairments: pt is reliant on his wife - per their report, he has started attempting tasks such as making raman noodles etc, he frequently forgets items or leaves them in   AUDITORY COMPREHENSION  Overall auditory comprehension: Appears intact; impaired d/t cognitive deficits in attention and memory  READING COMPREHENSION: Not tested during this session  EXPRESSION: verbal  VERBAL EXPRESSION:   Overall verbal expression: Appears intact Level of generative/spontaneous verbalization: conversation Automatic speech: name: intact and social response: intact  Repetition: Appears intact Pragmatics: Impaired: abnormal effect and some symptoms of depression present Interfering components: attention  WRITTEN EXPRESSION: Dominant hand: right Written expression: Appears intact  ORAL MOTOR EXAMINATION Facial : WFL Lingual: WFL Velum: WFL Mandible: WFL Cough: WFL Voice: WFL  MOTOR SPEECH: Overall motor speech: Appears intact Level of impairment: Conversation Respiration: diaphragmatic/abdominal breathing Phonation: normal Resonance: WFL Articulation: Appears intact Intelligibility: Intelligible Motor planning: Appears intact  STANDARDIZED ASSESSMENTS:   Cognitive Linguistic Quick Test: AGE - 18 - 69   The Cognitive Linguistic Quick Test (CLQT) was administered to assess the relative status of five cognitive domains: attention, memory, language, executive functioning, and visuospatial skills. Scores from 10 tasks were used to estimate severity ratings (standardized  for age groups 18-69 years and 70-89 years) for each domain, a clock drawing task, as well as an overall composite severity rating of cognition.       Task Score Criterion Cut Scores  Personal Facts 8/8 8  Symbol Cancellation 11/12 11  Confrontation Naming 10/10 10  Clock Drawing  5/13 12  Story Retelling 7/10 6  Symbol Trails 1/10 9  Generative Naming 3/9 5               To be finished in next session  PATIENT REPORTED OUTCOME MEASURES (PROM): To be completed over the next 3 sessions   TODAY'S TREATMENT:  N/A   PATIENT EDUCATION: Education details: results of this assessment Person educated: Patient and Spouse Education method: Explanation Education comprehension: needs further education   HOME EXERCISE PROGRAM:   N/A   GOALS:  Goals reviewed with patient? Yes  SHORT TERM GOALS: Target date: 10 sessions  Pt will complete cognitive communication assessment for determination of ST goals and POC.  Baseline: Goal status: INITIAL    LONG TERM GOALS: Target date: 07/18/2023  With Moderate A, patient will use strategies to improve memory for important information with 90% acc.(ie., white board, daily planner/calendar, Apps on phone).   Baseline:  Goal status: INITIAL   ASSESSMENT:  CLINICAL IMPRESSION: Patient is a 42 y.o. old right handed male who was seen today for a cognitive communication evaluation d/t presumed anoxic brain injury. Will  need to finish standardized testing over the next several sessions but suspect that pt presents with severe to profound cognitive impairment.   OBJECTIVE IMPAIRMENTS include attention, memory, awareness, and executive functioning. These impairments are limiting patient from return to work, managing medications, managing appointments, managing finances, household responsibilities, ADLs/IADLs, and effectively communicating at home and in community. Factors affecting potential to achieve goals and functional outcome are ability  to learn/carryover information, co-morbidities, medical prognosis, previous level of function, severity of impairments, and family/community support. Patient will benefit from skilled SLP services to address above impairments and improve overall function.  REHAB POTENTIAL: Good  PLAN: SLP FREQUENCY: 1-2x/week  SLP DURATION: 12 weeks  PLANNED INTERVENTIONS: Environmental controls, Cueing hierachy, Cognitive reorganization, Internal/external aids, Functional tasks, SLP instruction and feedback, Compensatory strategies, and Patient/family education   Cyncere Sontag B. Dreama Saa, M.S., CCC-SLP, Tree surgeon Certified Brain Injury Specialist [facility]  [facility] Office [phone] Ascom [phone] Fax [phone]

## 2023-04-29 ENCOUNTER — Ambulatory Visit: Payer: MEDICAID

## 2023-04-29 ENCOUNTER — Encounter: Payer: MEDICAID | Admitting: Occupational Therapy

## 2023-04-30 ENCOUNTER — Ambulatory Visit: Payer: MEDICAID | Admitting: Speech Pathology

## 2023-04-30 ENCOUNTER — Ambulatory Visit: Payer: MEDICAID

## 2023-04-30 ENCOUNTER — Ambulatory Visit: Payer: MEDICAID | Admitting: Occupational Therapy

## 2023-04-30 DIAGNOSIS — M6281 Muscle weakness (generalized): Secondary | ICD-10-CM

## 2023-04-30 DIAGNOSIS — R2689 Other abnormalities of gait and mobility: Secondary | ICD-10-CM

## 2023-04-30 DIAGNOSIS — R4189 Other symptoms and signs involving cognitive functions and awareness: Secondary | ICD-10-CM

## 2023-04-30 DIAGNOSIS — R278 Other lack of coordination: Secondary | ICD-10-CM

## 2023-04-30 DIAGNOSIS — R2681 Unsteadiness on feet: Secondary | ICD-10-CM

## 2023-04-30 DIAGNOSIS — R262 Difficulty in walking, not elsewhere classified: Secondary | ICD-10-CM

## 2023-04-30 DIAGNOSIS — R41841 Cognitive communication deficit: Secondary | ICD-10-CM

## 2023-04-30 NOTE — Therapy (Signed)
OUTPATIENT PHYSICAL THERAPY NEURO TREATMENT   Patient Name: Randall Parsons MRN: [id number] DOB:04-Apr-1981, 42 y.o., male Today's Date: 04/30/2023   PCP: Alease Medina, MD  REFERRING PROVIDER: Alease Medina, MD   END OF SESSION:  PT End of Session - 04/30/23 0847     Visit Number 2    Number of Visits 24    Date for PT Re-Evaluation 07/14/23    PT Start Time 1015    PT Stop Time 1100    PT Time Calculation (min) 45 min    Equipment Utilized During Treatment Gait belt    Activity Tolerance Patient tolerated treatment well    Behavior During Therapy WFL for tasks assessed/performed             Past Medical History:  Diagnosis Date   Anxiety    Asthma    CHF (congestive heart failure) (HCC)    Depression    Exposure to hepatitis B    Exposure to hepatitis C    Hepatitis C    History of kidney stones    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, ending around 2017   Hypertension    IV drug abuse (HCC)    Myocardial infarction (HCC)    Renal disorder    kidney stones   Stroke [facility])    Past Surgical History:  Procedure Laterality Date   APPENDECTOMY     EYE SURGERY     secondary to dog bite   I & D EXTREMITY Right 01/31/2023   Procedure: IRRIGATION AND DEBRIDEMENT INDEX FINGER;  Surgeon: Betha Loa, MD;  Location: [facility] OR;  Service: Orthopedics;  Laterality: Right;   TIBIA FRACTURE SURGERY Right    Patient Active Problem List   Diagnosis Date Noted   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/01/2023   Abscess of finger 01/31/2023   Hypokalemia 01/31/2023   Paranoid delusion (HCC) 01/31/2023   History of hepatitis C 01/31/2023   Toxic encephalopathy 01/10/2023   MDD (major depressive disorder), recurrent episode, severe (HCC) 12/22/2021   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) 12/18/2021   Left wrist pain 08/27/2021   Bronchospasm 02/18/2021   COVID-19 vaccine dose declined  12/20/2020   Hepatitis C test positive 12/20/2020   Prolactin increased 12/20/2020   Advance care planning 12/20/2020   GAD (generalized anxiety disorder) 04/04/2020   Attention deficit hyperactivity disorder, combined type 04/04/2020   Opioid type dependence, continuous (HCC) 04/04/2020   Major depressive disorder 07/14/2019   Alcohol abuse 07/14/2019   Cocaine abuse (HCC) 07/12/2019   Low back pain 11/28/2017   NICM (nonischemic cardiomyopathy) (HCC) 11/28/2017   Renal stone 11/28/2017   History of seizure 03/14/2017   IVDU (intravenous drug user) 03/14/2017   Other specified abnormal immunological findings in serum 12/21/2015    ONSET DATE: 01/10/2023  REFERRING DIAG: G92.9 (ICD-10-CM) - Unspecified toxic encephalopathy   THERAPY DIAG:  Balance disorder  Unsteadiness on feet  Muscle weakness (generalized)  Other lack of coordination  Difficulty in walking, not elsewhere classified  Impaired cognition  Rationale for Evaluation and Treatment: Rehabilitation  SUBJECTIVE:  SUBJECTIVE STATEMENT: Patient reports feeling okay- having some cervical pain- going 3x/week to chiropractor.   Pt accompanied by: significant other "[name]"   PERTINENT HISTORY: Pt admitted to hospital after He was found at a hotel on 4/5 by staff unresponsive, apneic with pinpoint pupils. He was given Narcan in the field, after this became combative and staff was unable to redirect him. On arrival to the ER he remained combative, his jaw was clenched, he was hypoxic, and ultimately intubated for airway protection. Admitted with Acute hypoxic respiratory failure secondary to aspiration pneumonia, pulmonary edema, NSTEMI, in the setting of acute toxic encephalopathy caused by recreational drug overdose, history of IVDA in  the past. At hospital d/c on 4/13 on room air, will be discharged on rescue inhaler 3 more days of oral Levaquin with outpatient PCP follow-up.  Pt has has 2 subsequent ED visits for finger Abscess 01/2023 and Peg tub removal on 6/26 due to pain at peg site. From ED visit "PEG tube removed without difficulty. Dressings given. Outpatient follow-up with GI and hospice. Discharge home."  Per PT referral, pt has continued to have BLE with intermittent difficulty walking as well as getting up from toilet.   PAIN:  Are you having pain? Yes: NPRS scale: 8/10 Pain location: Neck  Pain description: stiffness  Aggravating factors: sleeping  Relieving factors: Chiropractor   PRECAUTIONS: Fall  RED FLAGS: None   WEIGHT BEARING RESTRICTIONS: No  FALLS: Has patient fallen in last 6 months? No  LIVING ENVIRONMENT: Lives with: lives with their family and lives with their spouse Lives in: House/apartment Stairs: Yes: Internal: flight  steps; on left going up and External: 1 steps; none Has following equipment at home:  has walker but does not use it  and None  PLOF: Independent and Independent with basic ADLs  PATIENT GOALS: get back to "normal" get back to work if able. Improve balance    OBJECTIVE:   DIAGNOSTIC FINDINGS:  EXAM: CT HEAD WITHOUT CONTRAST IMPRESSION: 1.  No evidence of an acute intracranial abnormality. 2. Chronic right basal ganglia lacunar infarct.    COGNITION: Overall cognitive status: Impaired confusion -    SENSATION: WFL  COORDINATION: Ankle to knee - mild dysmetria on the LLE Finger to nose - undershooting to the LUE    EDEMA:  WFL  MUSCLE TONE: WFL  MUSCLE LENGTH: WFL  DTRs:  Patella 2+ = Normal  POSTURE: rounded shoulders, forward head, and increased lumbar lordosis  LOWER EXTREMITY MMT      MMT  Right Eval Left Eval  Hip flexion 5 4+  Hip extension 5 4+  Hip abduction 5 5  Hip adduction 5 5  Hip internal rotation    Hip external  rotation    Knee flexion 5 5  Knee extension 5 4+  Ankle dorsiflexion 5 4+  Ankle plantarflexion    Ankle inversion    Ankle eversion     (Blank rows = not tested)  LOWER EXTREMITY MMT:    Grossly WFL  BED MOBILITY:  Sit to supine Complete Independence Supine to sit Complete Independence  TRANSFERS: Assistive device utilized: None  Sit to stand: Complete Independence Stand to sit: Complete Independence Chair to chair: Complete Independence Floor:  to be assessed   RAMP:  Level of Assistance: Complete Independence Assistive device utilized: None    CURB:  Level of Assistance: SBA Assistive device utilized: None Curb Comments: pt reports feeling more comfortable with rails   STAIRS: Level of Assistance: Modified independence  Stair Negotiation Technique: Alternating Pattern  with Bilateral Rails Number of Stairs: 4  Height of Stairs: 6  Comments: pt reports feeling more confident with rails   GAIT: Gait pattern: ataxic and lateral hip instability Distance walked: 137ft Assistive device utilized: None Level of assistance: Modified independence Comments: mild L hip weakness and ataxia on the LLE   FUNCTIONAL TESTS:  5 times sit to stand: 9.39 Timed up and go (TUG): 12sec 6 minute walk test: TBD 10 meter walk test: normal 0.62m/s fast:o.58m/s  Berg Balance Scale: 49 Dynamic Gait Index: TBD  PATIENT SURVEYS:  FOTO 61  TODAY'S TREATMENT:                                                                                                                              DATE: 04/30/23   6 minute walk test= 890 feet without an AD Balance HEP. - Will defer to next session Visual assessment- has eye appointment scheduled for next week.   Instructed patient in LE strengthening HEP as described below:  Sit to stand x 10 reps without UE Support Step up onto 1st step without UE support x 12 reps each LE Sidestepping left to right and back at support bar with RTB around  ankles- x 4.        PATIENT EDUCATION: Education details: POC, Clinic Orientation. PT evaluation  Person educated: Patient and Spouse Education method: Explanation and Demonstration Education comprehension: verbalized understanding  HOME EXERCISE PROGRAM:  Access Code: [email or web address] URL: [email or web address] Date: 04/30/2023 Prepared by: Maureen Ralphs  Exercises - Sit to Stand with Arms Crossed  - 1 x daily - 3 x weekly - 3 sets - 10 reps - Forward Step Up with Counter Support  - 1 x daily - 3 x weekly - 3 sets - 10 reps - Side Stepping with Resistance at Feet  - 1 x daily - 3 x weekly - 3 sets - 10 reps   GOALS: Goals reviewed with patient? Yes   SHORT TERM GOALS: Target date: 05/27/2023    Patient will be independent in home exercise program to improve strength/mobility for better functional independence with ADLs. Baseline: to be given at next session  Goal status: INITIAL   LONG TERM GOALS: Target date: 07/14/2023    Patient will increase FOTO score to equal to or greater than 65    to demonstrate statistically significant improvement in mobility and quality of life.  Baseline: 61 Goal status: INITIAL  2. Pt will increase 6 min walk test by >275ft to demonstrate reduced fall risk and improved access to community.   Baseline: to be completed at next visit  Goal status : Initial    3.  Patient will increase Berg Balance score by > 6 points to demonstrate decreased fall risk during functional activities Baseline: 49 Goal status: INITIAL  4.  Patient will increase 10 meter walk test to >1.74m/s as to improve gait speed for better community ambulation  and to reduce fall risk. Baseline: 0.33m/s Goal status: INITIAL  5.  Patient will reduce timed up and go to <11 seconds to reduce fall risk and demonstrate improved transfer/gait ability. Baseline: 12sec Goal status: INITIAL  6.  Patient will increase dynamic gait index score to >19/24 as to  demonstrate reduced fall risk and improved dynamic gait balance for better safety with community/home ambulation.   Baseline: 18 Goal status: INITIAL   ASSESSMENT:  CLINICAL IMPRESSION: Patient presents with decreased functional endurance and gait impairment- as seen by decreased distance with 6 min walk test. He responded well overall to adding some LE strengthening to HEP without any issues. He had no LOB with walking or exercises but did present with fatigue as limiting factor.  Pt will benefit from skilled PT to address strength, balance and coordination deficits to improve overall QoL and allow return to PLOF.     OBJECTIVE IMPAIRMENTS: Abnormal gait, cardiopulmonary status limiting activity, decreased balance, decreased cognition, decreased coordination, decreased endurance, decreased knowledge of condition, decreased knowledge of use of DME, decreased mobility, decreased strength, decreased safety awareness, impaired vision/preception, improper body mechanics, and postural dysfunction.   ACTIVITY LIMITATIONS: carrying, lifting, standing, squatting, stairs, and locomotion level  PARTICIPATION LIMITATIONS: cleaning, laundry, medication management, personal finances, driving, shopping, community activity, occupation, and yard work  PERSONAL FACTORS: Age, Behavior pattern, Education, and Fitness are also affecting patient's functional outcome.   REHAB POTENTIAL: Excellent  CLINICAL DECISION MAKING: Stable/uncomplicated  EVALUATION COMPLEXITY: Moderate  PLAN:  PT FREQUENCY: 1-2x/week  PT DURATION: 12 weeks  PLANNED INTERVENTIONS: Therapeutic exercises, Therapeutic activity, Neuromuscular re-education, Balance training, Gait training, Patient/Family education, Self Care, Joint mobilization, Stair training, Vestibular training, DME instructions, Cognitive remediation, Electrical stimulation, Wheelchair mobility training, Cryotherapy, Moist heat, scar mobilization, Splintting, Taping,  Biofeedback, Manual therapy, and Re-evaluation  PLAN FOR NEXT SESSION:     Lenda Kelp, PT 04/30/2023, 12:42 PM

## 2023-04-30 NOTE — Therapy (Signed)
OUTPATIENT SPEECH LANGUAGE PATHOLOGY  COGNITION TREATMENT   Patient Name: Randall Parsons MRN: [id number] DOB:[birth year], 42 y.o., male Today's Date: 04/30/2023  PCP: [facility] REFERRING PROVIDER: Cheri Kearns, MD   End of Session - 04/30/23 0849     Visit Number 2    Number of Visits 25    Date for SLP Re-Evaluation 07/18/23    Authorization Type [employer]    Progress Note Due on Visit 10    SLP Start Time 0845    SLP Stop Time  0930    SLP Time Calculation (min) 45 min    Activity Tolerance Patient tolerated treatment well             Past Medical History:  Diagnosis Date   Anxiety    Asthma    CHF (congestive heart failure) (HCC)    Depression    Exposure to hepatitis B    Exposure to hepatitis C    Hepatitis C    History of kidney stones    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, ending around 2017   Hypertension    IV drug abuse (HCC)    Myocardial infarction (HCC)    Renal disorder    kidney stones   Stroke [facility])    Past Surgical History:  Procedure Laterality Date   APPENDECTOMY     EYE SURGERY     secondary to dog bite   I & D EXTREMITY Right 01/31/2023   Procedure: IRRIGATION AND DEBRIDEMENT INDEX FINGER;  Surgeon: Betha Loa, MD;  Location: [facility] OR;  Service: Orthopedics;  Laterality: Right;   TIBIA FRACTURE SURGERY Right    Patient Active Problem List   Diagnosis Date Noted   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/01/2023   Abscess of finger 01/31/2023   Hypokalemia 01/31/2023   Paranoid delusion (HCC) 01/31/2023   History of hepatitis C 01/31/2023   Toxic encephalopathy 01/10/2023   MDD (major depressive disorder), recurrent episode, severe (HCC) 12/22/2021   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) 12/18/2021   Left wrist pain 08/27/2021   Bronchospasm 02/18/2021   COVID-19 vaccine dose declined 12/20/2020   Hepatitis C test positive  12/20/2020   Prolactin increased 12/20/2020   Advance care planning 12/20/2020   GAD (generalized anxiety disorder) 04/04/2020   Attention deficit hyperactivity disorder, combined type 04/04/2020   Opioid type dependence, continuous (HCC) 04/04/2020   Major depressive disorder 07/14/2019   Alcohol abuse 07/14/2019   Cocaine abuse (HCC) 07/12/2019   Low back pain 11/28/2017   NICM (nonischemic cardiomyopathy) (HCC) 11/28/2017   Renal stone 11/28/2017   History of seizure 03/14/2017   IVDU (intravenous drug user) 03/14/2017   Other specified abnormal immunological findings in serum 12/21/2015    ONSET DATE: 01/10/2023; date of referral 04/24/2023   REFERRING DIAG: G92.9 (ICD-10-CM) - Unspecified toxic encephalopathy   THERAPY DIAG:  Cognitive communication deficit  Impaired cognition  Rationale for Evaluation and Treatment Rehabilitation  SUBJECTIVE:   PERTINENT HISTORY: Pt is a right handed 42 year old male with past medical history of polysubstance abuse, Hep C, IVDA, depression, ETOH, substance induced mood disorder who has history of hypoxic respiratory failure  and toxic encephalopathy cause by multiple drug overdoses. After discharging from [facility] on 02/02/2023 he was hospitalized in [place] and in a coma for multiple days (per his wife's report).  DIAGNOSTIC FINDINGS:  Head CT 01/10/2023 1.  No evidence of an  acute intracranial abnormality. 2. Chronic right basal ganglia lacunar infarct.  PAIN:  Are you having pain? No   FALLS: Has patient fallen in last 6 months?  No  LIVING ENVIRONMENT: Lives with: lives with their family Lives in: House/apartment  PLOF:  Level of assistance: Needed assistance with ADLs, Needed assistance with IADLS Employment: Other: not employed at this time   PATIENT GOALS   to get his ability to perform tasks back  SUBJECTIVE STATEMENT: Pt observed to be anxious, physical shaking, reports anxious state Pt accompanied by:  significant other  OBJECTIVE:   TODAY'S TREATMENT:  Skilled treatment session focused on pt's cognitive communication goals. SLP facilitated session by providing the following interventions:  Pt arrived to session with questions regarding anxiety. He reports being at the chiropractor's office early this morning and being very "jumpy" by the alarm intending completion of his treatment. He and his wife further report increased anxiety when riding with her such as grabbing onto car and pressing foot into floorboard. Pt observed with increased anxiety during today's session such as physical movements. Education provided on recommendation to seek psychiatric evaluation for potential assistance with anxiety to promote functional progress towards cognitive goals. Information shared for [facility] in [facility] as well as information for potential neurology follow up d/t anoxic brain injury.   STANDARDIZED ASSESSMENTS:  Pt was too anxious to complete standardized assessment during today's treatment session.   PATIENT REPORTED OUTCOME MEASURES (PROM):  The Neuro-QOLT Item Bank v2.0-Cognition Function-Short Form is an eight-item test designed to measure difficulties with cognitive functioning (e.g., memory, attention and decision making or in the application of such abilities to everyday tasks (e.g., planning, organizing, calculating, remembering and learning). Source: Duwaine Maxin, J-S, et al. (2012). Neuro-QOL: brief measures of health-related quality of life for clinical research in neurology. Neurology, 78(23), [phone].   In the past 7 days...  I had to read something several times to understand it. 1- Very Often (several times a day)  My thinking was slow. 2- Often (once a day)  I had to work really hard to pay attention or I would make a mistake. 1- Very Often (several times a day)  I had trouble concentrating. 1- Very Often (several times a day)   How much DIFFICULTY do you currently have...   Reading and following complex instructions (e.g., directions for a new medication)? 1- Cannot do  Planning for and keeping appointments that are not part of your weekly routine (e.g., a therapy or doctor appointment, or a social gathering with friends and family)? 1- Cannot do  Managing your time to do most of your daily activities? 1- Cannot do  Learning new tasks or instructions? 3- Somewhat   T-SCORE: 24; 2.5 SD below mean of 50   SLP further facilitated session by providing Moderate A to complete 2 household organizational tasks taken from [facility] 5: Chapter 3 - Organization p73.   PATIENT EDUCATION: Education details:  Person educated: Patient and Spouse Education method: Explanation Education comprehension: needs further education   HOME EXERCISE PROGRAM:   N/A   GOALS:  Goals reviewed with patient? Yes  SHORT TERM GOALS: Target date: 10 sessions  Pt will complete cognitive communication assessment for determination of ST goals and POC.  Baseline: Goal status: INITIAL    LONG TERM GOALS: Target date: 07/18/2023  With Moderate A, patient will use strategies to improve memory for important information with 90% acc.(ie., white board, daily planner/calendar, Apps on phone).   Baseline:  Goal  status: INITIAL   ASSESSMENT:  CLINICAL IMPRESSION: Patient is a 42 y.o. old right handed male who was seen today for a cognitive communication therapy session d/t presumed anoxic brain injury.   OBJECTIVE IMPAIRMENTS include attention, memory, awareness, and executive functioning. These impairments are limiting patient from return to work, managing medications, managing appointments, managing finances, household responsibilities, ADLs/IADLs, and effectively communicating at home and in community. Factors affecting potential to achieve goals and functional outcome are ability to learn/carryover information, co-morbidities, medical prognosis, previous level of function, severity of  impairments, and family/community support. Patient will benefit from skilled SLP services to address above impairments and improve overall function.  REHAB POTENTIAL: Good  PLAN: SLP FREQUENCY: 1-2x/week  SLP DURATION: 12 weeks  PLANNED INTERVENTIONS: Environmental controls, Cueing hierachy, Cognitive reorganization, Internal/external aids, Functional tasks, SLP instruction and feedback, Compensatory strategies, and Patient/family education   Abby Stines B. Dreama Saa, M.S., CCC-SLP, Tree surgeon Certified Brain Injury Specialist [facility]  [facility] Office [phone] Ascom [phone] Fax [phone]

## 2023-04-30 NOTE — Therapy (Addendum)
OUTPATIENT OCCUPATIONAL THERAPY NEURO TREATMENT NOTE  Patient Name: Randall Parsons MRN: [id number] DOB:[birth year], 42 y.o., male Today's Date: 04/30/2023  PCP: Dr. Cheri Kearns REFERRING PROVIDER: Hillery Aldo  END OF SESSION:  OT End of Session - 04/30/23 1024     Visit Number 2    Number of Visits 24    Date for OT Re-Evaluation 07/14/23    OT Start Time 0935    OT Stop Time 1015    OT Time Calculation (min) 40 min    Activity Tolerance Patient tolerated treatment well    Behavior During Therapy [facility] for tasks assessed/performed             Past Medical History:  Diagnosis Date   Anxiety    Asthma    CHF (congestive heart failure) (HCC)    Depression    Exposure to hepatitis B    Exposure to hepatitis C    Hepatitis C    History of kidney stones    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, ending around 2017   Hypertension    IV drug abuse (HCC)    Myocardial infarction (HCC)    Renal disorder    kidney stones   Stroke [facility])    Past Surgical History:  Procedure Laterality Date   APPENDECTOMY     EYE SURGERY     secondary to dog bite   I & D EXTREMITY Right 01/31/2023   Procedure: IRRIGATION AND DEBRIDEMENT INDEX FINGER;  Surgeon: Betha Loa, MD;  Location: [facility] OR;  Service: Orthopedics;  Laterality: Right;   TIBIA FRACTURE SURGERY Right    Patient Active Problem List   Diagnosis Date Noted   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/01/2023   Abscess of finger 01/31/2023   Hypokalemia 01/31/2023   Paranoid delusion (HCC) 01/31/2023   History of hepatitis C 01/31/2023   Toxic encephalopathy 01/10/2023   MDD (major depressive disorder), recurrent episode, severe (HCC) 12/22/2021   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) 12/18/2021   Left wrist pain 08/27/2021   Bronchospasm 02/18/2021   COVID-19 vaccine dose declined 12/20/2020   Hepatitis C test positive 12/20/2020   Prolactin  increased 12/20/2020   Advance care planning 12/20/2020   GAD (generalized anxiety disorder) 04/04/2020   Attention deficit hyperactivity disorder, combined type 04/04/2020   Opioid type dependence, continuous (HCC) 04/04/2020   Major depressive disorder 07/14/2019   Alcohol abuse 07/14/2019   Cocaine abuse (HCC) 07/12/2019   Low back pain 11/28/2017   NICM (nonischemic cardiomyopathy) (HCC) 11/28/2017   Renal stone 11/28/2017   History of seizure 03/14/2017   IVDU (intravenous drug user) 03/14/2017   Other specified abnormal immunological findings in serum 12/21/2015    ONSET DATE: 01/11/2023  REFERRING DIAG: Toxic encephalopathy  THERAPY DIAG:  Muscle weakness (generalized)  Other lack of coordination  Rationale for Evaluation and Treatment: Rehabilitation  SUBJECTIVE:   SUBJECTIVE STATEMENT: Pt. reports that he is doing well today. Pt. Reports that he has been going to the Chiropractor to help with neck stiffness and pain he has been having.  Pt accompanied by: significant other  PERTINENT HISTORY: Pt. Is a 42 y.o. male who was admitted to the hospital on 01/10/2023 due to being found at a hotel unresponsive and apneic with pinpoint pupil. Pt. Was intubated for airway protection. Pt. Was admitted with acute hypoxic respiratory failure secondary to aspiration pneumonia, pulmonary edema, NSTEMI, and acute toxic encephalopathy cause by  recreational drug overdose. Pt. has a history of polysubstance abuse, Hep C, IVDA, depression, ETOH, substance induced mood disorder.   PRECAUTIONS: No drinking with a straw due to swallowing issues  WEIGHT BEARING RESTRICTIONS: No  PAIN:  Are you having pain? Pt. Reports he is experiencing intermittent 5/10 pain today in his neck and lower back.   FALLS: Has patient fallen in last 6 months? No  LIVING ENVIRONMENT: Lives with: lives with their family Lives in: House/apartment Stairs: Yes: Internal: 15 steps; on left going up however Pt.  Reports he does not go upstairs very often because his bedroom is on main level, 1 external step to get into the house with no rails Has following equipment at home: None  PLOF: Independent  PATIENT GOALS: Completing tasks such as making a bowl of cereal, balance, making the bed, using the phone  OBJECTIVE:   HAND DOMINANCE: Right  ADLs: Eating: Increased time, Has to think through the tasks when completing them now Grooming: Able to use razor safely however requires increased time to think through UB Dressing: Able to put shirt on however sometimes experiences difficulties when putting his arms through the holes LB Dressing: Able to put pants on however sometimes puts pants on backwards and requires him to switch them to proper position.  Toileting: Able to sit down on toilet and clean self however, struggles getting up by your self at times, however wife has not helped in a week with tolieting and reports he has done well. Bathing: Independent, sometimes have difficulties remembering what they washed  Tub Shower transfers: Independent Equipment:  None  IADLs: Shopping: Wife mostly does grocery shopping, Pt. Will sometime goes with spouse Light housekeeping: Difficulty making up the bed Meal Prep: Wife does most of the cooking, pt. Able to prepare light meal Community mobility: Towards end of day Medication management: Not taking any medicine currently Financial management: Wife does Handwriting: 100% legible  MOBILITY STATUS: Independent  POSTURE COMMENTS:  No Significant postural limitations Sitting balance:  WFL  ACTIVITY TOLERANCE: Activity tolerance: WFL  FUNCTIONAL OUTCOME MEASURES: FOTO: 63 TR:63  UPPER EXTREMITY ROM:    Active ROM Right WFL Left WFL  Shoulder flexion    Shoulder abduction    Shoulder adduction    Shoulder extension    Shoulder internal rotation    Shoulder external rotation    Elbow flexion    Elbow extension    Wrist flexion    Wrist  extension    Wrist ulnar deviation    Wrist radial deviation    Wrist pronation    Wrist supination    (Blank rows = not tested)  UPPER EXTREMITY MMT:     MMT Right eval Left eval  Shoulder flexion 4+/5 4/5  Shoulder abduction 4+/5 4/5  Shoulder adduction    Shoulder extension    Shoulder internal rotation    Shoulder external rotation    Middle trapezius    Lower trapezius    Elbow flexion 5/5 4+/5  Elbow extension 5/5 4+/5  Wrist flexion 5/5 4/5  Wrist extension 5/5 4/5  Wrist ulnar deviation    Wrist radial deviation    Wrist pronation    Wrist supination    (Blank rows = not tested)  HAND FUNCTION: Grip strength: Right: 50 lbs; Left: 36 lbs, Lateral pinch: Right: 15 lbs, Left: 13 lbs, and 3 point pinch: Right: 15 lbs, Left: 10 lbs  COORDINATION: 9 Hole Peg test: Right: 28 sec; Left: 32 sec  SENSATION:To be  assessed further in functional contexts   EDEMA: None  MUSCLE TONE: None  COGNITION: Overall cognitive status: Impaired  VISION: Subjective report: Pt. Reports vision has gotten worse since the brain damage and unable to read his bible sometimes in church. Pt. Was advised to go to eye doctor due to visual changes present. Baseline vision: Wears glasses for reading only Visual history: brain injury  VISION ASSESSMENT: To be further assessed in functional context  Patient has difficulty with following activities due to following visual impairments: Reading his bible at church  PERCEPTION: To be assessed further in functional contexts  PRAXIS: [facility]  TODAY'S TREATMENT:                                                                                                                              DATE: 04/30/2023  Therapeutic Exercise:   Pt. Worked on BUE strengthening and reciprocal motion using the SCIFit set to 2.5 workload level for 8 minutes. Pt. Worked on L hand grip strength using the hand grip strengthener to remove the jumbo pegs from the peg  board. Pt. Worked on grasping jumbo pegs and storing as many as he could within his hand before placing them onto the peg board. Hand grip strengthener was set to 17.9# x 1 trial and adjusted to 23.4# x 2 trials. Pt. Worked on green theraputty exercises for L hand strengthening. Exercises include: gross gripping, gross digit extension, lateral and 3 point pinch strengthening, digit abduction, thumb opposition. Pt. Was provided with a visual handout HEP through [employer].    PATIENT EDUCATION: Education details: Theraputty exercises for L hand strength Person educated: Patient and Spouse Education method: Explanation, Demonstration, and Verbal cues Education comprehension: verbalized understanding  HOME EXERCISE PROGRAM: Theraputty exercises for L hand strengthing with green resistive theraputty   GOALS: Goals reviewed with patient? Yes  SHORT TERM GOALS: Target date: 07/03/2023  Pt. Will improve FOTO score by 2 points to reflect improved perceived function performance in specific ADLS/IADLs.  Baseline: Eval: FOTO: 63 TR: 63 Goal status: INITIAL   LONG TERM GOALS: Target date: 07/14/2023  Pt. Will improve L shoulder strength by 1 mm grade to be able to sustain UE elevation while completing overhead reaching tasks.  Baseline: EVAL: L: Shoulder flexion: 4/5, Shoulder abduction: 4/5 Goal status: INITIAL  2.  Pt will increase L hand grip strength by 5# to be able to securely hold objects, open containers, and jars. Baseline: Eval: Grip strength: Right: 50 lbs; Left: 36 lbs Goal status: INITIAL  3.  Pt. Will independently make up his bed following the proper sequence of  steps needed to complete the task 100% of the time.  Baseline: Eval: Pt. requires increased time, and assist to sequence through the steps of bed making tasks. Goal status: INITIAL  4.  Pt. Will improve L [facility] skills by 2 seconds of speed to independently manipulate small objects during ADL/IADL tasks.  Baseline: Eval: 9  Hole Peg  test: Right: 28 sec; Left: 32 sec Goal status: INITIAL  5. Pt. will independently identify, or demonstrate compensatory strategies for donning UE, and LE clothing in the correct direction. Baseline: Eval: Pt. Puts clothing on backwards, inside out, on a different body part, or out of sequence. Goal status: INITIAL  ASSESSMENT:  CLINICAL IMPRESSION:  Pt. Tolerated all therapeutic exercise well today. Pt. was able to complete 8 minutes at 2.5 workload level on the SCIFit bike however required increased verbal cues for hand placement on handles and when changing into reverse direction. Pt. Was able to use the hand grip strengthener set to 17.9# for trial one and it was adjusted to 23.4# for the remainder two trials to increase difficulty. Pt. Required increased verbal cues for proper hand placement on the hand grip strengthener and dropped a total of 2 pegs. Pt. experienced difficulties with processing picking up multiple pegs at a time and storing them within his hand and required increased verbal cues. Pt. Was receptive of education handout given on theraputty exercises for L hand strengthening. Pt. Verbalized understanding and demonstrated how to do each exercise. Pt. Reported that he will start completing the theraputty exercises at home. Pt. would benefit from skilled OT services to improve sequencing skills, [facility] skills, and LUE strength to increase independence in ADL/IADL tasks.   PERFORMANCE DEFICITS: in functional skills including ADLs, IADLs, coordination, strength, pain, Fine motor control, Gross motor control, balance, and UE functional use, cognitive skills including sequencing, and psychosocial skills including coping strategies, environmental adaptation, and routines and behaviors.   IMPAIRMENTS: are limiting patient from ADLs, IADLs, and work.   CO-MORBIDITIES: may have co-morbidities  that affects occupational performance. Patient will benefit from skilled OT to address above  impairments and improve overall function.  MODIFICATION OR ASSISTANCE TO COMPLETE EVALUATION: No modification of tasks or assist necessary to complete an evaluation.  OT OCCUPATIONAL PROFILE AND HISTORY: Detailed assessment: Review of records and additional review of physical, cognitive, psychosocial history related to current functional performance.  CLINICAL DECISION MAKING: Moderate - several treatment options, min-mod task modification necessary  REHAB POTENTIAL: Good  EVALUATION COMPLEXITY: Moderate    PLAN:  OT FREQUENCY: 2x/week  OT DURATION: 12 weeks  PLANNED INTERVENTIONS: self care/ADL training, therapeutic exercise, therapeutic activity, neuromuscular re-education, patient/family education, and cognitive remediation/compensation  RECOMMENDED OTHER SERVICES: ST, PT  CONSULTED AND AGREED WITH PLAN OF CARE: Patient and family member/caregiver  PLAN FOR NEXT SESSION: Initiate treatment  Herma Carson, OTS  This entire session was performed under the direct supervision and direction of a licensed therapist. I have personally read, edited, and approve of the note as written.   Olegario Messier, MS, OTR/L 04/30/2023, 10:26 AM

## 2023-05-02 ENCOUNTER — Ambulatory Visit: Payer: MEDICAID

## 2023-05-02 ENCOUNTER — Ambulatory Visit: Payer: MEDICAID | Admitting: Speech Pathology

## 2023-05-02 DIAGNOSIS — G929 Unspecified toxic encephalopathy: Secondary | ICD-10-CM

## 2023-05-02 DIAGNOSIS — R278 Other lack of coordination: Secondary | ICD-10-CM

## 2023-05-02 DIAGNOSIS — R2689 Other abnormalities of gait and mobility: Secondary | ICD-10-CM

## 2023-05-02 DIAGNOSIS — M6281 Muscle weakness (generalized): Secondary | ICD-10-CM

## 2023-05-02 DIAGNOSIS — R41841 Cognitive communication deficit: Secondary | ICD-10-CM

## 2023-05-02 NOTE — Therapy (Signed)
OUTPATIENT OCCUPATIONAL THERAPY NEURO TREATMENT NOTE  Patient Name: Randall Parsons MRN: [id number] DOB:31-May-1981, 42 y.o., male Today's Date: 05/02/2023  PCP: Dr. Cheri Kearns REFERRING PROVIDER: Hillery Aldo  END OF SESSION:  OT End of Session - 05/02/23 1107     Visit Number 3    Number of Visits 24    Date for OT Re-Evaluation 07/14/23    OT Start Time 0800    OT Stop Time 0845    OT Time Calculation (min) 45 min    Activity Tolerance Patient tolerated treatment well    Behavior During Therapy [facility] for tasks assessed/performed            Past Medical History:  Diagnosis Date   Anxiety    Asthma    CHF (congestive heart failure) (HCC)    Depression    Exposure to hepatitis B    Exposure to hepatitis C    Hepatitis C    History of kidney stones    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, ending around 2017   Hypertension    IV drug abuse (HCC)    Myocardial infarction (HCC)    Renal disorder    kidney stones   Stroke [facility])    Past Surgical History:  Procedure Laterality Date   APPENDECTOMY     EYE SURGERY     secondary to dog bite   I & D EXTREMITY Right 01/31/2023   Procedure: IRRIGATION AND DEBRIDEMENT INDEX FINGER;  Surgeon: Betha Loa, MD;  Location: [facility] OR;  Service: Orthopedics;  Laterality: Right;   TIBIA FRACTURE SURGERY Right    Patient Active Problem List   Diagnosis Date Noted   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/01/2023   Abscess of finger 01/31/2023   Hypokalemia 01/31/2023   Paranoid delusion (HCC) 01/31/2023   History of hepatitis C 01/31/2023   Toxic encephalopathy 01/10/2023   MDD (major depressive disorder), recurrent episode, severe (HCC) 12/22/2021   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) 12/18/2021   Left wrist pain 08/27/2021   Bronchospasm 02/18/2021   COVID-19 vaccine dose declined 12/20/2020   Hepatitis C test positive 12/20/2020   Prolactin  increased 12/20/2020   Advance care planning 12/20/2020   GAD (generalized anxiety disorder) 04/04/2020   Attention deficit hyperactivity disorder, combined type 04/04/2020   Opioid type dependence, continuous (HCC) 04/04/2020   Major depressive disorder 07/14/2019   Alcohol abuse 07/14/2019   Cocaine abuse (HCC) 07/12/2019   Low back pain 11/28/2017   NICM (nonischemic cardiomyopathy) (HCC) 11/28/2017   Renal stone 11/28/2017   History of seizure 03/14/2017   IVDU (intravenous drug user) 03/14/2017   Other specified abnormal immunological findings in serum 12/21/2015    ONSET DATE: 01/11/2023  REFERRING DIAG: Toxic encephalopathy  THERAPY DIAG:  Muscle weakness (generalized)  Other lack of coordination  Unspecified toxic encephalopathy  Rationale for Evaluation and Treatment: Rehabilitation  SUBJECTIVE:   SUBJECTIVE STATEMENT: Pt and spouse report pt to be doing better with dressing, but still making some errors.  Pt admitted he'd not been turning on the lights and will start to dress with the lights on to help with clothing orientation.  Pt accompanied by: significant other  PERTINENT HISTORY: Pt. Is a 42 y.o. male who was admitted to the hospital on 01/10/2023 due to being found at a hotel unresponsive and apneic with pinpoint pupil. Pt. Was intubated for airway protection. Pt. Was admitted with acute hypoxic respiratory  failure secondary to aspiration pneumonia, pulmonary edema, NSTEMI, and acute toxic encephalopathy cause by recreational drug overdose. Pt. has a history of polysubstance abuse, Hep C, IVDA, depression, ETOH, substance induced mood disorder.   PRECAUTIONS: No drinking with a straw due to swallowing issues  WEIGHT BEARING RESTRICTIONS: No  PAIN:  Are you having pain? No pain reported this date.  FALLS: Has patient fallen in last 6 months? No  LIVING ENVIRONMENT: Lives with: lives with their family Lives in: House/apartment Stairs: Yes: Internal: 15  steps; on left going up however Pt. Reports he does not go upstairs very often because his bedroom is on main level, 1 external step to get into the house with no rails Has following equipment at home: None  PLOF: Independent  PATIENT GOALS: Completing tasks such as making a bowl of cereal, balance, making the bed, using the phone  OBJECTIVE:   HAND DOMINANCE: Right  ADLs: Eating: Increased time, Has to think through the tasks when completing them now Grooming: Able to use razor safely however requires increased time to think through UB Dressing: Able to put shirt on however sometimes experiences difficulties when putting his arms through the holes LB Dressing: Able to put pants on however sometimes puts pants on backwards and requires him to switch them to proper position.  Toileting: Able to sit down on toilet and clean self however, struggles getting up by your self at times, however wife has not helped in a week with tolieting and reports he has done well. Bathing: Independent, sometimes have difficulties remembering what they washed  Tub Shower transfers: Independent Equipment:  None  IADLs: Shopping: Wife mostly does grocery shopping, Pt. Will sometime goes with spouse Light housekeeping: Difficulty making up the bed Meal Prep: Wife does most of the cooking, pt. Able to prepare light meal Community mobility: Towards end of day Medication management: Not taking any medicine currently Financial management: Wife does Handwriting: 100% legible  MOBILITY STATUS: Independent  POSTURE COMMENTS:  No Significant postural limitations Sitting balance:  WFL  ACTIVITY TOLERANCE: Activity tolerance: WFL  FUNCTIONAL OUTCOME MEASURES: FOTO: 63 TR:63  UPPER EXTREMITY ROM:    Active ROM Right WFL Left WFL  Shoulder flexion    Shoulder abduction    Shoulder adduction    Shoulder extension    Shoulder internal rotation    Shoulder external rotation    Elbow flexion    Elbow  extension    Wrist flexion    Wrist extension    Wrist ulnar deviation    Wrist radial deviation    Wrist pronation    Wrist supination    (Blank rows = not tested)  UPPER EXTREMITY MMT:     MMT Right eval Left eval  Shoulder flexion 4+/5 4/5  Shoulder abduction 4+/5 4/5  Shoulder adduction    Shoulder extension    Shoulder internal rotation    Shoulder external rotation    Middle trapezius    Lower trapezius    Elbow flexion 5/5 4+/5  Elbow extension 5/5 4+/5  Wrist flexion 5/5 4/5  Wrist extension 5/5 4/5  Wrist ulnar deviation    Wrist radial deviation    Wrist pronation    Wrist supination    (Blank rows = not tested)  HAND FUNCTION: Grip strength: Right: 50 lbs; Left: 36 lbs, Lateral pinch: Right: 15 lbs, Left: 13 lbs, and 3 point pinch: Right: 15 lbs, Left: 10 lbs  COORDINATION: 9 Hole Peg test: Right: 28 sec; Left: 32 sec  SENSATION:To be assessed further in functional contexts  EDEMA: None  MUSCLE TONE: None  COGNITION: Overall cognitive status: Impaired  VISION: Subjective report: Pt. Reports vision has gotten worse since the brain damage and unable to read his bible sometimes in church. Pt. Was advised to go to eye doctor due to visual changes present. Baseline vision: Wears glasses for reading only Visual history: brain injury  VISION ASSESSMENT: To be further assessed in functional context  Patient has difficulty with following activities due to following visual impairments: Reading his bible at church  PERCEPTION: To be assessed further in functional contexts  PRAXIS: WFL  TODAY'S TREATMENT:                                                                                                                              DATE: 05/02/2023 Self Care: Reviewed dressing strategies to help with clothing orientation: dressing with lights on, in front of mirror for visual cues, limit distractions, look for tags/labels/print on clothing to help with  orientation.  Therapeutic Activity: ADL 4 card sequencing scenarios (10 total scenarios):  Extra time and min vc.  Reviewed benefits of taking mental rest breaks as needed to increase attention to task and problem solving skills. Prompted pt to move cards with L hand to promote increased attention to L side, as spouse reported pt to be improving, he still tends to use the L hand less as compared to prior to hospitalization.  PATIENT EDUCATION: Education details: compensatory dressing strategies  Person educated: Patient and Spouse Education method: explanation, vc Education comprehension: verbalized understanding  HOME EXERCISE PROGRAM: Theraputty exercises for L hand strengthing with green resistive theraputty  GOALS: Goals reviewed with patient? Yes  SHORT TERM GOALS: Target date: 07/03/2023  Pt. Will improve FOTO score by 2 points to reflect improved perceived function performance in specific ADLS/IADLs.  Baseline: Eval: FOTO: 63 TR: 63 Goal status: INITIAL   LONG TERM GOALS: Target date: 07/14/2023  Pt. Will improve L shoulder strength by 1 mm grade to be able to sustain UE elevation while completing overhead reaching tasks.  Baseline: EVAL: L: Shoulder flexion: 4/5, Shoulder abduction: 4/5 Goal status: INITIAL  2.  Pt will increase L hand grip strength by 5# to be able to securely hold objects, open containers, and jars. Baseline: Eval: Grip strength: Right: 50 lbs; Left: 36 lbs Goal status: INITIAL  3.  Pt. Will independently make up his bed following the proper sequence of  steps needed to complete the task 100% of the time.  Baseline: Eval: Pt. requires increased time, and assist to sequence through the steps of bed making tasks. Goal status: INITIAL  4.  Pt. Will improve [facility] skills by 2 seconds of speed to independently manipulate small objects during ADL/IADL tasks.  Baseline: Eval: 9 Hole Peg test: Right: 28 sec; Left: 32 sec Goal status: INITIAL  5. Pt. will  independently identify, or demonstrate compensatory strategies for donning UE, and  LE clothing in the correct direction. Baseline: Eval: Pt. Puts clothing on backwards, inside out, on a different body part, or out of sequence. Goal status: INITIAL  ASSESSMENT:  CLINICAL IMPRESSION: Pt and spouse report pt to be doing better with dressing, but still making some errors.  Pt admitted he'd not been turning on the lights and will start to dress with the lights on to help with clothing orientation.  Reviewed additional compensatory strategies as noted in tx section; pt receptive to all strategies.  Additional focus on ADL sequencing this date.  Able to complete nine 4 card ADL sequences correctly with extra time.  Pt made several errors but self corrected when prompted to verbalize the actions on the card to "talk through it," with pt noting this helped him with his problem solving.  1 of 10 cards required 1 vc to correct the sequence.  Pt reports he has started to use his theraputty at home.  Pt will continue to benefit from skilled OT services to improve sequencing skills, [facility] skills, and LUE strength to increase independence in ADL/IADL tasks.   PERFORMANCE DEFICITS: in functional skills including ADLs, IADLs, coordination, strength, pain, Fine motor control, Gross motor control, balance, and UE functional use, cognitive skills including sequencing, and psychosocial skills including coping strategies, environmental adaptation, and routines and behaviors.   IMPAIRMENTS: are limiting patient from ADLs, IADLs, and work.   CO-MORBIDITIES: may have co-morbidities  that affects occupational performance. Patient will benefit from skilled OT to address above impairments and improve overall function.  MODIFICATION OR ASSISTANCE TO COMPLETE EVALUATION: No modification of tasks or assist necessary to complete an evaluation.  OT OCCUPATIONAL PROFILE AND HISTORY: Detailed assessment: Review of records and  additional review of physical, cognitive, psychosocial history related to current functional performance.  CLINICAL DECISION MAKING: Moderate - several treatment options, min-mod task modification necessary  REHAB POTENTIAL: Good  EVALUATION COMPLEXITY: Moderate    PLAN:  OT FREQUENCY: 2x/week  OT DURATION: 12 weeks  PLANNED INTERVENTIONS: self care/ADL training, therapeutic exercise, therapeutic activity, neuromuscular re-education, patient/family education, and cognitive remediation/compensation  RECOMMENDED OTHER SERVICES: ST, PT  CONSULTED AND AGREED WITH PLAN OF CARE: Patient and family member/caregiver  PLAN FOR NEXT SESSION: Initiate treatment  Danelle Earthly, MS, OTR/L

## 2023-05-02 NOTE — Therapy (Addendum)
OUTPATIENT PHYSICAL THERAPY NEURO TREATMENT   Patient Name: Randall Parsons MRN: [id number] DOB:1980-11-29, 42 y.o., male Today's Date: 05/02/2023   PCP: Alease Medina, MD  REFERRING PROVIDER: Alease Medina, MD   END OF SESSION:  PT End of Session - 05/02/23 0943     Visit Number 3    Number of Visits 24    Date for PT Re-Evaluation 07/14/23    PT Start Time 0945    PT Stop Time 1025    PT Time Calculation (min) 40 min    Equipment Utilized During Treatment Gait belt    Activity Tolerance Patient tolerated treatment well    Behavior During Therapy WFL for tasks assessed/performed             Past Medical History:  Diagnosis Date   Anxiety    Asthma    CHF (congestive heart failure) (HCC)    Depression    Exposure to hepatitis B    Exposure to hepatitis C    Hepatitis C    History of kidney stones    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, ending around 2017   Hypertension    IV drug abuse (HCC)    Myocardial infarction (HCC)    Renal disorder    kidney stones   Stroke [facility])    Past Surgical History:  Procedure Laterality Date   APPENDECTOMY     EYE SURGERY     secondary to dog bite   I & D EXTREMITY Right 01/31/2023   Procedure: IRRIGATION AND DEBRIDEMENT INDEX FINGER;  Surgeon: Betha Loa, MD;  Location: [facility] OR;  Service: Orthopedics;  Laterality: Right;   TIBIA FRACTURE SURGERY Right    Patient Active Problem List   Diagnosis Date Noted   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/01/2023   Abscess of finger 01/31/2023   Hypokalemia 01/31/2023   Paranoid delusion (HCC) 01/31/2023   History of hepatitis C 01/31/2023   Toxic encephalopathy 01/10/2023   MDD (major depressive disorder), recurrent episode, severe (HCC) 12/22/2021   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) 12/18/2021   Left wrist pain 08/27/2021   Bronchospasm 02/18/2021   COVID-19 vaccine dose declined  12/20/2020   Hepatitis C test positive 12/20/2020   Prolactin increased 12/20/2020   Advance care planning 12/20/2020   GAD (generalized anxiety disorder) 04/04/2020   Attention deficit hyperactivity disorder, combined type 04/04/2020   Opioid type dependence, continuous (HCC) 04/04/2020   Major depressive disorder 07/14/2019   Alcohol abuse 07/14/2019   Cocaine abuse (HCC) 07/12/2019   Low back pain 11/28/2017   NICM (nonischemic cardiomyopathy) (HCC) 11/28/2017   Renal stone 11/28/2017   History of seizure 03/14/2017   IVDU (intravenous drug user) 03/14/2017   Other specified abnormal immunological findings in serum 12/21/2015    ONSET DATE: 01/10/2023  REFERRING DIAG: G92.9 (ICD-10-CM) - Unspecified toxic encephalopathy   THERAPY DIAG:  Muscle weakness (generalized)  Other lack of coordination  Balance disorder  Rationale for Evaluation and Treatment: Rehabilitation  SUBJECTIVE:  SUBJECTIVE STATEMENT: Doing well, has been working on HEP at home along with other exercises. Pt anxious to return to resistance training at gym.   Pt accompanied by: significant other "[name]"   PERTINENT HISTORY: Pt admitted to hospital after He was found at a hotel on 4/5 by staff unresponsive, apneic with pinpoint pupils. He was given Narcan in the field, after this became combative and staff was unable to redirect him. On arrival to the ER he remained combative, his jaw was clenched, he was hypoxic, and ultimately intubated for airway protection. Admitted with Acute hypoxic respiratory failure secondary to aspiration pneumonia, pulmonary edema, NSTEMI, in the setting of acute toxic encephalopathy caused by recreational drug overdose, history of IVDA in the past. At hospital d/c on 4/13 on room air, will be  discharged on rescue inhaler 3 more days of oral Levaquin with outpatient PCP follow-up.  Pt has has 2 subsequent ED visits for finger Abscess 01/2023 and Peg tub removal on 6/26 due to pain at peg site. From ED visit "PEG tube removed without difficulty. Dressings given. Outpatient follow-up with GI and hospice. Discharge home."  Per PT referral, pt has continued to have BLE with intermittent difficulty walking as well as getting up from toilet.   PAIN:  Are you having pain? Yes: NPRS scale: 8/10 Pain location: Neck  Pain description: stiffness  Aggravating factors: sleeping  Relieving factors: Chiropractor   PRECAUTIONS: Fall  RED FLAGS: None   WEIGHT BEARING RESTRICTIONS: No  FALLS: Has patient fallen in last 6 months? No   PATIENT GOALS: get back to "normal" get back to work if able. Improve balance    OBJECTIVE:    TODAY'S TREATMENT:                                                                                                                              DATE: 05/02/23   -standing to prone ot standing x2 -quadruped alternate nose taps x16 -10lb AW applied to Left ankle for entire session -1537ft gym to garden, no natural surfaces due to wetness post rain (2 standing rest breaks)  -25lb in box, lift from floor, carry to happi's door and back x2 -normal stance airex eyes closed x30sec -airex pad stance weighted ball toss/catch (8lb ball)  -standing cable row 27.5lb x15 -cable resisted chop 12.5lb 1x10 bilat    PATIENT EDUCATION: Education details: POC, Clinic Orientation. PT evaluation  Person educated: Patient and Spouse Education method: Explanation and Demonstration Education comprehension: verbalized understanding  HOME EXERCISE PROGRAM:  Access Code: [email or web address] URL: [email or web address] Date: 04/30/2023 Prepared by: Maureen Ralphs  Exercises - Sit to Stand with Arms Crossed  - 1 x daily - 3 x weekly - 3 sets - 10 reps - Forward Step Up  with Counter Support  - 1 x daily - 3 x weekly - 3 sets - 10 reps - Side Stepping with Resistance at Feet  - 1 x daily - 3 x  weekly - 3 sets - 10 reps   GOALS: Goals reviewed with patient? Yes   SHORT TERM GOALS: Target date: 05/27/2023    Patient will be independent in home exercise program to improve strength/mobility for better functional independence with ADLs. Baseline: to be given at next session  Goal status: INITIAL   LONG TERM GOALS: Target date: 07/14/2023    Patient will increase FOTO score to equal to or greater than 65    to demonstrate statistically significant improvement in mobility and quality of life.  Baseline: 61 Goal status: INITIAL  2. Pt will increase 6 min walk test by >269ft to demonstrate reduced fall risk and improved access to community.   Baseline: to be completed at next visit  Goal status : Initial    3.  Patient will increase Berg Balance score by > 6 points to demonstrate decreased fall risk during functional activities Baseline: 49 Goal status: INITIAL  4.  Patient will increase 10 meter walk test to >1.40m/s as to improve gait speed for better community ambulation and to reduce fall risk. Baseline: 0.6m/s Goal status: INITIAL  5.  Patient will reduce timed up and go to <11 seconds to reduce fall risk and demonstrate improved transfer/gait ability. Baseline: 12sec Goal status: INITIAL  6.  Patient will increase dynamic gait index score to >19/24 as to demonstrate reduced fall risk and improved dynamic gait balance for better safety with community/home ambulation.   Baseline: 18 Goal status: INITIAL   ASSESSMENT:  CLINICAL IMPRESSION: Continued to advance high level, fully body activities today. Pt has fatigue but able to recover with standing rest intervals <2 minutes. Pt encouraged to return to gym this week for best ultimate outcomes, encouraged return to safe/easy gym activity, plan to discuss more difficult ones. Pt will benefit  from skilled PT to address strength, balance and coordination deficits to improve overall QoL and allow return to PLOF.     OBJECTIVE IMPAIRMENTS: Abnormal gait, cardiopulmonary status limiting activity, decreased balance, decreased cognition, decreased coordination, decreased endurance, decreased knowledge of condition, decreased knowledge of use of DME, decreased mobility, decreased strength, decreased safety awareness, impaired vision/preception, improper body mechanics, and postural dysfunction.   ACTIVITY LIMITATIONS: carrying, lifting, standing, squatting, stairs, and locomotion level  PARTICIPATION LIMITATIONS: cleaning, laundry, medication management, personal finances, driving, shopping, community activity, occupation, and yard work  PERSONAL FACTORS: Age, Behavior pattern, Education, and Fitness are also affecting patient's functional outcome.   REHAB POTENTIAL: Excellent  CLINICAL DECISION MAKING: Stable/uncomplicated  EVALUATION COMPLEXITY: Moderate  PLAN:  PT FREQUENCY: 1-2x/week  PT DURATION: 12 weeks  PLANNED INTERVENTIONS: Therapeutic exercises, Therapeutic activity, Neuromuscular re-education, Balance training, Gait training, Patient/Family education, Self Care, Joint mobilization, Stair training, Vestibular training, DME instructions, Cognitive remediation, Electrical stimulation, Wheelchair mobility training, Cryotherapy, Moist heat, scar mobilization, Splintting, Taping, Biofeedback, Manual therapy, and Re-evaluation  PLAN FOR NEXT SESSION:  Conitnued to promote return to gym    Evo Aderman C, PT 05/02/2023, 10:13 AM  10:41 AM, 05/02/23 Rosamaria Lints, PT, DPT Physical Therapist - [facility]  Outpatient Physical Therapy- Main Campus [phone]

## 2023-05-02 NOTE — Therapy (Signed)
OUTPATIENT SPEECH LANGUAGE PATHOLOGY  COGNITION TREATMENT   Patient Name: Randall Parsons MRN: [id number] DOB:09-04-1981, 42 y.o., male Today's Date: 05/02/2023  PCP: [facility] REFERRING PROVIDER: Cheri Kearns, MD   End of Session - 05/02/23 [phone]     Visit Number 3    Number of Visits 25    Date for SLP Re-Evaluation 07/18/23    Authorization Type [employer]    Progress Note Due on Visit 10    SLP Start Time 0845    SLP Stop Time  0930    SLP Time Calculation (min) 45 min    Activity Tolerance Patient tolerated treatment well             Past Medical History:  Diagnosis Date   Anxiety    Asthma    CHF (congestive heart failure) (HCC)    Depression    Exposure to hepatitis B    Exposure to hepatitis C    Hepatitis C    History of kidney stones    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, ending around 2017   Hypertension    IV drug abuse (HCC)    Myocardial infarction (HCC)    Renal disorder    kidney stones   Stroke [facility])    Past Surgical History:  Procedure Laterality Date   APPENDECTOMY     EYE SURGERY     secondary to dog bite   I & D EXTREMITY Right 01/31/2023   Procedure: IRRIGATION AND DEBRIDEMENT INDEX FINGER;  Surgeon: Betha Loa, MD;  Location: [facility] OR;  Service: Orthopedics;  Laterality: Right;   TIBIA FRACTURE SURGERY Right    Patient Active Problem List   Diagnosis Date Noted   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/01/2023   Abscess of finger 01/31/2023   Hypokalemia 01/31/2023   Paranoid delusion (HCC) 01/31/2023   History of hepatitis C 01/31/2023   Toxic encephalopathy 01/10/2023   MDD (major depressive disorder), recurrent episode, severe (HCC) 12/22/2021   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) 12/18/2021   Left wrist pain 08/27/2021   Bronchospasm 02/18/2021   COVID-19 vaccine dose declined 12/20/2020   Hepatitis C test positive  12/20/2020   Prolactin increased 12/20/2020   Advance care planning 12/20/2020   GAD (generalized anxiety disorder) 04/04/2020   Attention deficit hyperactivity disorder, combined type 04/04/2020   Opioid type dependence, continuous (HCC) 04/04/2020   Major depressive disorder 07/14/2019   Alcohol abuse 07/14/2019   Cocaine abuse (HCC) 07/12/2019   Low back pain 11/28/2017   NICM (nonischemic cardiomyopathy) (HCC) 11/28/2017   Renal stone 11/28/2017   History of seizure 03/14/2017   IVDU (intravenous drug user) 03/14/2017   Other specified abnormal immunological findings in serum 12/21/2015    ONSET DATE: 01/10/2023; date of referral 04/24/2023   REFERRING DIAG: G92.9 (ICD-10-CM) - Unspecified toxic encephalopathy   THERAPY DIAG:  Cognitive communication deficit  Unspecified toxic encephalopathy  Rationale for Evaluation and Treatment Rehabilitation  SUBJECTIVE:   PERTINENT HISTORY: Pt is a right handed 42 year old male with past medical history of polysubstance abuse, Hep C, IVDA, depression, ETOH, substance induced mood disorder who has history of hypoxic respiratory failure  and toxic encephalopathy cause by multiple drug overdoses. After discharging from [facility] on 02/02/2023 he was hospitalized in [place] and in a coma for multiple days (per his wife's report).  DIAGNOSTIC FINDINGS:  Head CT 01/10/2023 1.  No evidence of  an acute intracranial abnormality. 2. Chronic right basal ganglia lacunar infarct.  PAIN:  Are you having pain? No   FALLS: Has patient fallen in last 6 months?  No  LIVING ENVIRONMENT: Lives with: lives with their family Lives in: House/apartment  PLOF:  Level of assistance: Needed assistance with ADLs, Needed assistance with IADLS Employment: Other: not employed at this time   PATIENT GOALS   to get his ability to perform tasks back  SUBJECTIVE STATEMENT: Pt observed to be anxious, physical shaking, reports anxious state Pt  accompanied by: significant other  OBJECTIVE:   TODAY'S TREATMENT:  Skilled treatment session focused on pt's cognitive communication goals. SLP facilitated session by providing the following interventions:  SLP facilitated session by completed pt's standardized assessment.   STANDARDIZED ASSESSMENTS:    Cognitive Linguistic Quick Test: AGE - 18 - 69   The Cognitive Linguistic Quick Test (CLQT) was administered to assess the relative status of five cognitive domains: attention, memory, language, executive functioning, and visuospatial skills. Scores from 10 tasks were used to estimate severity ratings (standardized for age groups 18-69 years and 70-89 years) for each domain, a clock drawing task, as well as an overall composite severity rating of cognition.       Task Score Criterion Cut Scores  Personal Facts 8/8 8  Symbol Cancellation 11/12 11  Confrontation Naming 10/10 10  Clock Drawing  5/13 12  Story Retelling 7/10 6  Symbol Trails 1/10 9  Generative Naming 3/9 5  Design Memory 6/6 5  Mazes  4/8 7  Design Generation 4/13 6    Cognitive Domain Composite Score Severity Rating  Attention 145/215 Mild  Memory 161/185 WNL  Executive Function 9/40 Severe  Language 28/37 Mild  Visuospatial Skills 61/105 Mild  Clock Drawing  5/13 Severe  Composite Severity Rating  Mild           PATIENT EDUCATION: Education details: results of the completed assessment Person educated: Patient and Spouse Education method: Explanation Education comprehension: needs further education   HOME EXERCISE PROGRAM:   N/A   GOALS:  Goals reviewed with patient? Yes  SHORT TERM GOALS: Target date: 10 sessions  Pt will complete cognitive communication assessment for determination of ST goals and POC.  Baseline: Goal status: INITIAL    LONG TERM GOALS: Target date: 07/18/2023  With Moderate A, patient will use strategies to improve memory for important information with 90%  acc.(ie., white board, daily planner/calendar, Apps on phone).   Baseline:  Goal status: INITIAL   ASSESSMENT:  CLINICAL IMPRESSION: Patient is a 42 y.o. old right handed male who was seen today for a cognitive communication therapy session d/t presumed anoxic brain injury. Pt presents with behaviors consistent with Rancho Level VI (Confused, appropriate: Moderate Assistance). Pt reports difficulty sequencing basic tasks such as cooking pre-made waffles and applying toppings. He and his wife also report new learning abilities when using his cell phone to enter information to support memory.   OBJECTIVE IMPAIRMENTS include attention, memory, awareness, and executive functioning. These impairments are limiting patient from return to work, managing medications, managing appointments, managing finances, household responsibilities, ADLs/IADLs, and effectively communicating at home and in community. Factors affecting potential to achieve goals and functional outcome are ability to learn/carryover information, co-morbidities, medical prognosis, previous level of function, severity of impairments, and family/community support. Patient will benefit from skilled SLP services to address above impairments and improve overall function.  REHAB POTENTIAL: Good  PLAN: SLP FREQUENCY: 1-2x/week  SLP DURATION: 12  weeks  PLANNED INTERVENTIONS: Environmental controls, Cueing hierachy, Cognitive reorganization, Internal/external aids, Functional tasks, SLP instruction and feedback, Compensatory strategies, and Patient/family education   Anni Hocevar B. Dreama Saa, M.S., CCC-SLP, Tree surgeon Certified Brain Injury Specialist [facility]  [facility] Office [phone] Ascom [phone] Fax [phone]

## 2023-05-06 ENCOUNTER — Ambulatory Visit: Payer: MEDICAID | Admitting: Physical Therapy

## 2023-05-06 ENCOUNTER — Ambulatory Visit: Payer: MEDICAID

## 2023-05-06 ENCOUNTER — Ambulatory Visit: Payer: MEDICAID | Admitting: Speech Pathology

## 2023-05-06 DIAGNOSIS — R2681 Unsteadiness on feet: Secondary | ICD-10-CM

## 2023-05-06 DIAGNOSIS — M6281 Muscle weakness (generalized): Secondary | ICD-10-CM

## 2023-05-06 DIAGNOSIS — R41841 Cognitive communication deficit: Secondary | ICD-10-CM

## 2023-05-06 DIAGNOSIS — R278 Other lack of coordination: Secondary | ICD-10-CM

## 2023-05-06 DIAGNOSIS — R262 Difficulty in walking, not elsewhere classified: Secondary | ICD-10-CM

## 2023-05-06 DIAGNOSIS — G929 Unspecified toxic encephalopathy: Secondary | ICD-10-CM

## 2023-05-06 DIAGNOSIS — R4189 Other symptoms and signs involving cognitive functions and awareness: Secondary | ICD-10-CM

## 2023-05-06 DIAGNOSIS — R2689 Other abnormalities of gait and mobility: Secondary | ICD-10-CM

## 2023-05-06 NOTE — Therapy (Signed)
OUTPATIENT PHYSICAL THERAPY NEURO TREATMENT   Patient Name: Randall Parsons MRN: [id number] DOB:1981/04/11, 42 y.o., male Today's Date: 05/06/2023   PCP: Alease Medina, MD  REFERRING PROVIDER: Alease Medina, MD   END OF SESSION:    Past Medical History:  Diagnosis Date   Anxiety    Asthma    CHF (congestive heart failure) (HCC)    Depression    Exposure to hepatitis B    Exposure to hepatitis C    Hepatitis C    History of kidney stones    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, ending around 2017   Hypertension    IV drug abuse (HCC)    Myocardial infarction (HCC)    Renal disorder    kidney stones   Stroke [facility])    Past Surgical History:  Procedure Laterality Date   APPENDECTOMY     EYE SURGERY     secondary to dog bite   I & D EXTREMITY Right 01/31/2023   Procedure: IRRIGATION AND DEBRIDEMENT INDEX FINGER;  Surgeon: Betha Loa, MD;  Location: [facility] OR;  Service: Orthopedics;  Laterality: Right;   TIBIA FRACTURE SURGERY Right    Patient Active Problem List   Diagnosis Date Noted   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/01/2023   Abscess of finger 01/31/2023   Hypokalemia 01/31/2023   Paranoid delusion (HCC) 01/31/2023   History of hepatitis C 01/31/2023   Toxic encephalopathy 01/10/2023   MDD (major depressive disorder), recurrent episode, severe (HCC) 12/22/2021   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) 12/18/2021   Left wrist pain 08/27/2021   Bronchospasm 02/18/2021   COVID-19 vaccine dose declined 12/20/2020   Hepatitis C test positive 12/20/2020   Prolactin increased 12/20/2020   Advance care planning 12/20/2020   GAD (generalized anxiety disorder) 04/04/2020   Attention deficit hyperactivity disorder, combined type 04/04/2020   Opioid type dependence, continuous (HCC) 04/04/2020   Major depressive disorder 07/14/2019   Alcohol abuse 07/14/2019   Cocaine abuse (HCC)  07/12/2019   Low back pain 11/28/2017   NICM (nonischemic cardiomyopathy) (HCC) 11/28/2017   Renal stone 11/28/2017   History of seizure 03/14/2017   IVDU (intravenous drug user) 03/14/2017   Other specified abnormal immunological findings in serum 12/21/2015    ONSET DATE: 01/10/2023  REFERRING DIAG: G92.9 (ICD-10-CM) - Unspecified toxic encephalopathy   THERAPY DIAG:  Balance disorder  Unsteadiness on feet  Difficulty in walking, not elsewhere classified  Muscle weakness (generalized)  Rationale for Evaluation and Treatment: Rehabilitation  SUBJECTIVE:  SUBJECTIVE STATEMENT: Pt reports he is putting in paperwork for the [employer] so he can get a membership at a lower price. This is keeping him from the gym at this time. Pt report he was very sore and is still recovering from previous visit.   Pt accompanied by: significant other "[name]"   PERTINENT HISTORY: Pt admitted to hospital after He was found at a hotel on 4/5 by staff unresponsive, apneic with pinpoint pupils. He was given Narcan in the field, after this became combative and staff was unable to redirect him. On arrival to the ER he remained combative, his jaw was clenched, he was hypoxic, and ultimately intubated for airway protection. Admitted with Acute hypoxic respiratory failure secondary to aspiration pneumonia, pulmonary edema, NSTEMI, in the setting of acute toxic encephalopathy caused by recreational drug overdose, history of IVDA in the past. At hospital d/c on 4/13 on room air, will be discharged on rescue inhaler 3 more days of oral Levaquin with outpatient PCP follow-up.  Pt has has 2 subsequent ED visits for finger Abscess 01/2023 and Peg tub removal on 6/26 due to pain at peg site. From ED visit "PEG tube removed without difficulty.  Dressings given. Outpatient follow-up with GI and hospice. Discharge home."  Per PT referral, pt has continued to have BLE with intermittent difficulty walking as well as getting up from toilet.   PAIN:  Are you having pain? Yes: NPRS scale: 8/10 Pain location: Neck  Pain description: stiffness  Aggravating factors: sleeping  Relieving factors: Chiropractor   PRECAUTIONS: Fall  RED FLAGS: None   WEIGHT BEARING RESTRICTIONS: No  FALLS: Has patient fallen in last 6 months? No   PATIENT GOALS: get back to "normal" get back to work if able. Improve balance    OBJECTIVE:    TODAY'S TREATMENT:                                                                                                                              DATE: 05/06/23    NMR Ambulate across stable and unstable surface outside. Negotiating changing surfaces from grass to sidewalk, across brick with turns and obstacles in pathway without LOB. X 15 min of ambulation   TE Leg press 3 x 10 with 70#  - pt rates high difficulty   NMR  1 LE airex and 1 LE on 4 inch step  2 x 30 sec ea LE, cues for correct completion, added head turns on second set, no overt OB  Airex SLS and foot taps ant, lat and post x 5 ea way  Airex SLS with foot taps on command for dual task    PATIENT EDUCATION: Education details: POC, Clinic Orientation. PT evaluation  Person educated: Patient and Spouse Education method: Explanation and Demonstration Education comprehension: verbalized understanding  HOME EXERCISE PROGRAM:  Access Code: [email or web address] URL: [email or web address] Date: 04/30/2023 Prepared by: Maureen Ralphs  Exercises - Sit to Stand with Arms Crossed  -  1 x daily - 3 x weekly - 3 sets - 10 reps - Forward Step Up with Counter Support  - 1 x daily - 3 x weekly - 3 sets - 10 reps - Side Stepping with Resistance at Feet  - 1 x daily - 3 x weekly - 3 sets - 10 reps   GOALS: Goals reviewed with patient?  Yes   SHORT TERM GOALS: Target date: 05/27/2023    Patient will be independent in home exercise program to improve strength/mobility for better functional independence with ADLs. Baseline: to be given at next session  Goal status: INITIAL   LONG TERM GOALS: Target date: 07/14/2023    Patient will increase FOTO score to equal to or greater than 65    to demonstrate statistically significant improvement in mobility and quality of life.  Baseline: 61 Goal status: INITIAL  2. Pt will increase 6 min walk test by >2109ft to demonstrate reduced fall risk and improved access to community.   Baseline: to be completed at next visit  Goal status : Initial    3.  Patient will increase Berg Balance score by > 6 points to demonstrate decreased fall risk during functional activities Baseline: 49 Goal status: INITIAL  4.  Patient will increase 10 meter walk test to >1.61m/s as to improve gait speed for better community ambulation and to reduce fall risk. Baseline: 0.53m/s Goal status: INITIAL  5.  Patient will reduce timed up and go to <11 seconds to reduce fall risk and demonstrate improved transfer/gait ability. Baseline: 12sec Goal status: INITIAL  6.  Patient will increase dynamic gait index score to >19/24 as to demonstrate reduced fall risk and improved dynamic gait balance for better safety with community/home ambulation.   Baseline: 18 Goal status: INITIAL   ASSESSMENT:  CLINICAL IMPRESSION:  Continued with current plan of care as laid out in evaluation and recent prior sessions. Pt remains motivated to advance progress toward goals in order to maximize independence and safety at home. Pt requires high level assistance and cuing for completion of exercises in order to provide adequate level of stimulation and perturbation. Author allows pt as much opportunity as possible to perform independent righting strategies, only stepping in when pt is unable to prevent falling to floor. Pt has  most challenge with dual task balance tasks. He also shows significant weakness compared to his reported baseline. Will continue with higher level strengthening depending on pt response ( pt was very sore and tired for multiple days following last visit). Pt continues to demonstrate progress toward goals AEB progression of some interventions this date either in volume or intensity.    OBJECTIVE IMPAIRMENTS: Abnormal gait, cardiopulmonary status limiting activity, decreased balance, decreased cognition, decreased coordination, decreased endurance, decreased knowledge of condition, decreased knowledge of use of DME, decreased mobility, decreased strength, decreased safety awareness, impaired vision/preception, improper body mechanics, and postural dysfunction.   ACTIVITY LIMITATIONS: carrying, lifting, standing, squatting, stairs, and locomotion level  PARTICIPATION LIMITATIONS: cleaning, laundry, medication management, personal finances, driving, shopping, community activity, occupation, and yard work  PERSONAL FACTORS: Age, Behavior pattern, Education, and Fitness are also affecting patient's functional outcome.   REHAB POTENTIAL: Excellent  CLINICAL DECISION MAKING: Stable/uncomplicated  EVALUATION COMPLEXITY: Moderate  PLAN:  PT FREQUENCY: 1-2x/week  PT DURATION: 12 weeks  PLANNED INTERVENTIONS: Therapeutic exercises, Therapeutic activity, Neuromuscular re-education, Balance training, Gait training, Patient/Family education, Self Care, Joint mobilization, Stair training, Vestibular training, DME instructions, Cognitive remediation, Electrical stimulation, Wheelchair mobility training, Cryotherapy, Moist heat,  scar mobilization, Splintting, Taping, Biofeedback, Manual therapy, and Re-evaluation  PLAN FOR NEXT SESSION:  Conitnued to promote return to gym    Norman Herrlich, PT 05/06/2023, 11:19 AM  11:19 AM, 05/06/23 Rosamaria Lints, PT, DPT Physical Therapist - [facility]  Outpatient Physical Therapy- Main Campus [phone]

## 2023-05-06 NOTE — Therapy (Signed)
OUTPATIENT OCCUPATIONAL THERAPY NEURO TREATMENT NOTE  Patient Name: Randall Parsons MRN: [id number] DOB:Jun 16, 1981, 42 y.o., male Today's Date: 05/06/2023  PCP: Dr. Cheri Parsons REFERRING PROVIDER: Hillery Parsons  END OF SESSION:  OT End of Session - 05/06/23 1132     Visit Number 4    Number of Visits 24    Date for OT Re-Evaluation 07/14/23    OT Start Time 1145    OT Stop Time 1230    OT Time Calculation (min) 45 min    Activity Tolerance Patient tolerated treatment well    Behavior During Therapy [facility] for tasks assessed/performed            Past Medical History:  Diagnosis Date   Anxiety    Asthma    CHF (congestive heart failure) (HCC)    Depression    Exposure to hepatitis B    Exposure to hepatitis C    Hepatitis C    History of kidney stones    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, ending around 2017   Hypertension    IV drug abuse (HCC)    Myocardial infarction (HCC)    Renal disorder    kidney stones   Stroke [facility])    Past Surgical History:  Procedure Laterality Date   APPENDECTOMY     EYE SURGERY     secondary to dog bite   I & D EXTREMITY Right 01/31/2023   Procedure: IRRIGATION AND DEBRIDEMENT INDEX FINGER;  Surgeon: Betha Loa, MD;  Location: [facility] OR;  Service: Orthopedics;  Laterality: Right;   TIBIA FRACTURE SURGERY Right    Patient Active Problem List   Diagnosis Date Noted   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/01/2023   Abscess of finger 01/31/2023   Hypokalemia 01/31/2023   Paranoid delusion (HCC) 01/31/2023   History of hepatitis C 01/31/2023   Toxic encephalopathy 01/10/2023   MDD (major depressive disorder), recurrent episode, severe (HCC) 12/22/2021   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) 12/18/2021   Left wrist pain 08/27/2021   Bronchospasm 02/18/2021   COVID-19 vaccine dose declined 12/20/2020   Hepatitis C test positive 12/20/2020   Prolactin  increased 12/20/2020   Advance care planning 12/20/2020   GAD (generalized anxiety disorder) 04/04/2020   Attention deficit hyperactivity disorder, combined type 04/04/2020   Opioid type dependence, continuous (HCC) 04/04/2020   Major depressive disorder 07/14/2019   Alcohol abuse 07/14/2019   Cocaine abuse (HCC) 07/12/2019   Low back pain 11/28/2017   NICM (nonischemic cardiomyopathy) (HCC) 11/28/2017   Renal stone 11/28/2017   History of seizure 03/14/2017   IVDU (intravenous drug user) 03/14/2017   Other specified abnormal immunological findings in serum 12/21/2015    ONSET DATE: 01/11/2023  REFERRING DIAG: Toxic encephalopathy  THERAPY DIAG:  Other lack of coordination  Muscle weakness (generalized)  Unspecified toxic encephalopathy  Impaired cognition  Rationale for Evaluation and Treatment: Rehabilitation  SUBJECTIVE:   SUBJECTIVE STATEMENT: Pt reports going to his step-sons [employer] Parsons competition this weekend.   Pt accompanied by: significant other  PERTINENT HISTORY: Pt. Is a 42 y.o. male who was admitted to the hospital on 01/10/2023 due to being found at a hotel unresponsive and apneic with pinpoint pupil. Pt. Was intubated for airway protection. Pt. Was admitted with acute hypoxic respiratory failure secondary to aspiration pneumonia, pulmonary edema, NSTEMI, and acute toxic encephalopathy cause by recreational drug overdose. Pt. has a history of polysubstance abuse, Hep C,  IVDA, depression, ETOH, substance induced mood disorder.   PRECAUTIONS: No drinking with a straw due to swallowing issues  WEIGHT BEARING RESTRICTIONS: No  PAIN:  Are you having pain? No pain reported this date.  FALLS: Has patient fallen in last 6 months? No  LIVING ENVIRONMENT: Lives with: lives with their family Lives in: House/apartment Stairs: Yes: Internal: 15 steps; on left going up however Pt. Reports he does not go upstairs very often because his bedroom is on main level, 1  external step to get into the house with no rails Has following equipment at home: None  PLOF: Independent  PATIENT GOALS: Completing tasks such as making a bowl of cereal, balance, making the bed, using the phone  OBJECTIVE:   HAND DOMINANCE: Right  ADLs: Eating: Increased time, Has to think through the tasks when completing them now Grooming: Able to use razor safely however requires increased time to think through UB Dressing: Able to put shirt on however sometimes experiences difficulties when putting his arms through the holes LB Dressing: Able to put pants on however sometimes puts pants on backwards and requires him to switch them to proper position.  Toileting: Able to sit down on toilet and clean self however, struggles getting up by your self at times, however wife has not helped in a week with tolieting and reports he has done well. Bathing: Independent, sometimes have difficulties remembering what they washed  Tub Shower transfers: Independent Equipment:  None  IADLs: Shopping: Wife mostly does grocery shopping, Pt. Will sometime goes with spouse Light housekeeping: Difficulty making up the bed Meal Prep: Wife does most of the cooking, pt. Able to prepare light meal Community mobility: Towards end of day Medication management: Not taking any medicine currently Financial management: Wife does Handwriting: 100% legible  MOBILITY STATUS: Independent  POSTURE COMMENTS:  No Significant postural limitations Sitting balance:  WFL  ACTIVITY TOLERANCE: Activity tolerance: WFL  FUNCTIONAL OUTCOME MEASURES: FOTO: 63 TR:63  UPPER EXTREMITY ROM:    Active ROM Right WFL Left WFL  Shoulder flexion    Shoulder abduction    Shoulder adduction    Shoulder extension    Shoulder internal rotation    Shoulder external rotation    Elbow flexion    Elbow extension    Wrist flexion    Wrist extension    Wrist ulnar deviation    Wrist radial deviation    Wrist pronation     Wrist supination    (Blank rows = not tested)  UPPER EXTREMITY MMT:     MMT Right eval Left eval  Shoulder flexion 4+/5 4/5  Shoulder abduction 4+/5 4/5  Shoulder adduction    Shoulder extension    Shoulder internal rotation    Shoulder external rotation    Middle trapezius    Lower trapezius    Elbow flexion 5/5 4+/5  Elbow extension 5/5 4+/5  Wrist flexion 5/5 4/5  Wrist extension 5/5 4/5  Wrist ulnar deviation    Wrist radial deviation    Wrist pronation    Wrist supination    (Blank rows = not tested)  HAND FUNCTION: Grip strength: Right: 50 lbs; Left: 36 lbs, Lateral pinch: Right: 15 lbs, Left: 13 lbs, and 3 point pinch: Right: 15 lbs, Left: 10 lbs  COORDINATION: 9 Hole Peg test: Right: 28 sec; Left: 32 sec  SENSATION:To be assessed further in functional contexts  EDEMA: None  MUSCLE TONE: None  COGNITION: Overall cognitive status: Impaired  VISION: Subjective report: Pt.  Reports vision has gotten worse since the brain damage and unable to read his bible sometimes in church. Pt. Was advised to go to eye doctor due to visual changes present. Baseline vision: Wears glasses for reading only Visual history: brain injury  VISION ASSESSMENT: To be further assessed in functional context  Patient has difficulty with following activities due to following visual impairments: Reading his bible at church  PERCEPTION: To be assessed further in functional contexts  PRAXIS: WFL  TODAY'S TREATMENT:                                                                                                                              DATE: 05/06/2023  Therapeutic Exercise: 5# dowel for seated BUE exercises 2 set x 10 reps each: overhead press, chest press, and circular patterns.    Therapeutic Activity: Pt completed the Trail Making Test (TMT), one of the most widely used for cognitive impairment, measures several functions including cognitive flexibility, alternating  attention, sequencing, visual search, and motor speed. Pt scored 25/25 correct on Trial Making Test Part A in 91 sec and 12/24 correct on Trail Making Test Part B in 3 minutes. These scores demonstrate the pt is experiencing cognitive impairment. Pt frequently removed pen from paper to locate numbers despite cueing. Significant difficulty with Part B alternating from numbers to letters.    Completed visual closure activity with MOD cues to correctly mirror half complete image. Pt completes larger outline with good ability to mirror however unable to replicate details or recognize errors without cues. Completed visual scanning tasks with good frustration tolerance. Discussed strategies for completing visual scanning tasks including taking rest breaks, adjusting paper position, using additional paper to block lines, and double checking his work.   PATIENT EDUCATION: Education details: compensatory dressing strategies  Person educated: Patient and Spouse Education method: explanation, vc Education comprehension: verbalized understanding  HOME EXERCISE PROGRAM: Theraputty exercises for L hand strengthing with green resistive theraputty  GOALS: Goals reviewed with patient? Yes  SHORT TERM GOALS: Target date: 07/03/2023  Pt. Will improve FOTO score by 2 points to reflect improved perceived function performance in specific ADLS/IADLs.  Baseline: Eval: FOTO: 63 TR: 63 Goal status: INITIAL   LONG TERM GOALS: Target date: 07/14/2023  Pt. Will improve L shoulder strength by 1 mm grade to be able to sustain UE elevation while completing overhead reaching tasks.  Baseline: EVAL: L: Shoulder flexion: 4/5, Shoulder abduction: 4/5 Goal status: INITIAL  2.  Pt will increase L hand grip strength by 5# to be able to securely hold objects, open containers, and jars. Baseline: Eval: Grip strength: Right: 50 lbs; Left: 36 lbs Goal status: INITIAL  3.  Pt. Will independently make up his bed following the  proper sequence of  steps needed to complete the task 100% of the time.  Baseline: Eval: Pt. requires increased time, and assist to sequence through the steps of bed making tasks. Goal status: INITIAL  4.  Pt. Will improve [facility] skills by 2 seconds of speed to independently manipulate small objects during ADL/IADL tasks.  Baseline: Eval: 9 Hole Peg test: Right: 28 sec; Left: 32 sec Goal status: INITIAL  5. Pt. will independently identify, or demonstrate compensatory strategies for donning UE, and LE clothing in the correct direction. Baseline: Eval: Pt. Puts clothing on backwards, inside out, on a different body part, or out of sequence. Goal status: INITIAL  ASSESSMENT:  CLINICAL IMPRESSION: Pt reports he has been making his bed but continues to have difficulty sequencing meal preparation tasks. Pt reports he is easily frustrated by word searches and other difficult cognitive tasks. Good tolerance of 5# dowel exercises. Completed trail making test and visual scanning/closure activities this session with good frustration tolerance. Significantly greater errors on part B vs part A of trail making however unable to complete both in provided time frame. Discussed strategies for completing visual scanning tasks including adjusting paper position, using additional paper to block lines, and double checking his work. Pt will continue to benefit from skilled OT services to improve sequencing skills, [facility] skills, and LUE strength to increase independence in ADL/IADL tasks.   PERFORMANCE DEFICITS: in functional skills including ADLs, IADLs, coordination, strength, pain, Fine motor control, Gross motor control, balance, and UE functional use, cognitive skills including sequencing, and psychosocial skills including coping strategies, environmental adaptation, and routines and behaviors.   IMPAIRMENTS: are limiting patient from ADLs, IADLs, and work.   CO-MORBIDITIES: may have co-morbidities  that affects  occupational performance. Patient will benefit from skilled OT to address above impairments and improve overall function.  MODIFICATION OR ASSISTANCE TO COMPLETE EVALUATION: No modification of tasks or assist necessary to complete an evaluation.  OT OCCUPATIONAL PROFILE AND HISTORY: Detailed assessment: Review of records and additional review of physical, cognitive, psychosocial history related to current functional performance.  CLINICAL DECISION MAKING: Moderate - several treatment options, min-mod task modification necessary  REHAB POTENTIAL: Good  EVALUATION COMPLEXITY: Moderate    PLAN:  OT FREQUENCY: 2x/week  OT DURATION: 12 weeks  PLANNED INTERVENTIONS: self care/ADL training, therapeutic exercise, therapeutic activity, neuromuscular re-education, patient/family education, and cognitive remediation/compensation  RECOMMENDED OTHER SERVICES: ST, PT  CONSULTED AND AGREED WITH PLAN OF CARE: Patient and family member/caregiver  PLAN FOR NEXT SESSION: Initiate treatment   Kathie Dike, M.S. OTR/L  05/06/23, 11:33 AM  ascom [phone]

## 2023-05-06 NOTE — Therapy (Signed)
OUTPATIENT SPEECH LANGUAGE PATHOLOGY  COGNITION TREATMENT   Patient Name: Randall Parsons MRN: [id number] DOB:[birth year], 42 y.o., male Today's Date: 05/06/2023  PCP: [facility] REFERRING PROVIDER: Cheri Kearns, MD   End of Session - 05/06/23 1237     Visit Number 4    Number of Visits 25    Date for SLP Re-Evaluation 07/18/23    Authorization Type [employer]    Progress Note Due on Visit 10    SLP Start Time 1230    SLP Stop Time  1315    SLP Time Calculation (min) 45 min    Activity Tolerance Patient tolerated treatment well             Past Medical History:  Diagnosis Date   Anxiety    Asthma    CHF (congestive heart failure) (HCC)    Depression    Exposure to hepatitis B    Exposure to hepatitis C    Hepatitis C    History of kidney stones    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, ending around 2017   Hypertension    IV drug abuse (HCC)    Myocardial infarction (HCC)    Renal disorder    kidney stones   Stroke [facility])    Past Surgical History:  Procedure Laterality Date   APPENDECTOMY     EYE SURGERY     secondary to dog bite   I & D EXTREMITY Right 01/31/2023   Procedure: IRRIGATION AND DEBRIDEMENT INDEX FINGER;  Surgeon: Betha Loa, MD;  Location: [facility] OR;  Service: Orthopedics;  Laterality: Right;   TIBIA FRACTURE SURGERY Right    Patient Active Problem List   Diagnosis Date Noted   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/01/2023   Abscess of finger 01/31/2023   Hypokalemia 01/31/2023   Paranoid delusion (HCC) 01/31/2023   History of hepatitis C 01/31/2023   Toxic encephalopathy 01/10/2023   MDD (major depressive disorder), recurrent episode, severe (HCC) 12/22/2021   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) 12/18/2021   Left wrist pain 08/27/2021   Bronchospasm 02/18/2021   COVID-19 vaccine dose declined 12/20/2020   Hepatitis C test positive  12/20/2020   Prolactin increased 12/20/2020   Advance care planning 12/20/2020   GAD (generalized anxiety disorder) 04/04/2020   Attention deficit hyperactivity disorder, combined type 04/04/2020   Opioid type dependence, continuous (HCC) 04/04/2020   Major depressive disorder 07/14/2019   Alcohol abuse 07/14/2019   Cocaine abuse (HCC) 07/12/2019   Low back pain 11/28/2017   NICM (nonischemic cardiomyopathy) (HCC) 11/28/2017   Renal stone 11/28/2017   History of seizure 03/14/2017   IVDU (intravenous drug user) 03/14/2017   Other specified abnormal immunological findings in serum 12/21/2015    ONSET DATE: 01/10/2023; date of referral 04/24/2023   REFERRING DIAG: G92.9 (ICD-10-CM) - Unspecified toxic encephalopathy   THERAPY DIAG:  Cognitive communication deficit  Unspecified toxic encephalopathy  Rationale for Evaluation and Treatment Rehabilitation  SUBJECTIVE:   PERTINENT HISTORY: Pt is a right handed 42 year old male with past medical history of polysubstance abuse, Hep C, IVDA, depression, ETOH, substance induced mood disorder who has history of hypoxic respiratory failure  and toxic encephalopathy cause by multiple drug overdoses. After discharging from [facility] on 02/02/2023 he was hospitalized in [place] and in a coma for multiple days (per his wife's report).  DIAGNOSTIC FINDINGS:  Head CT 01/10/2023 1.  No evidence of  an acute intracranial abnormality. 2. Chronic right basal ganglia lacunar infarct.  PAIN:  Are you having pain? No   FALLS: Has patient fallen in last 6 months?  No  LIVING ENVIRONMENT: Lives with: lives with their family Lives in: House/apartment  PLOF:  Level of assistance: Needed assistance with ADLs, Needed assistance with IADLS Employment: Other: not employed at this time   PATIENT GOALS   to get his ability to perform tasks back  SUBJECTIVE STATEMENT: Pt seen following OT/PT today, pt alone today, providing update on visit to  dentist Pt accompanied by: alone  OBJECTIVE:   TODAY'S TREATMENT:  Skilled treatment session focused on pt's cognitive communication goals. SLP facilitated session by providing the following interventions:  Pt arrived to session with various reasons as to why he hadn't completed his homework. Extensive education provided on need to complete HEP to promote cognitive functioning. Pt voiced understanding.   SLP further facilitated session by providing sequence tasks for basic household tasks ([employer] %: Chapter 3 - Organization [id number]). Pt benefited from initial prompting to read steps out loud before placing them in order. Pt was Mod I for use of strategy with moderate assistance for problem solving to achieve 75% accuracy.   Pt benefits from redirection to task instead of talking about fatigue experienced when doing "the simplest things."  PATIENT EDUCATION: Education details: see above Person educated: Patient and Spouse Education method: Explanation Education comprehension: needs further education   HOME EXERCISE PROGRAM:   Complete previous homework (mazes)   GOALS:  Goals reviewed with patient? Yes  SHORT TERM GOALS: Target date: 10 sessions  Pt will complete cognitive communication assessment for determination of ST goals and POC.  Baseline: Goal status: INITIAL; MET  2.   With Rare Min A, patient will use external aid for functional recall of completed/upcoming activities 80% accuracy.     Baseline:  Goal status: INITIAL  3.  Pt will complete HEP in 5 out of 7 opportunities.  Baseline:  Goal status: INITIAL  4.  With minimal assistance, pt with maintain selective attention to semi-complex problem solving tasks for 15 minutes.  Baseline:  Goal status: INITIAL    LONG TERM GOALS: Target date: 07/18/2023  With Moderate A, patient will use strategies to improve memory for important information with 90% acc.(ie., white board, daily planner/calendar, Apps on phone).    Baseline:  Goal status: INITIAL  2.  With Rare Min A, patient will a) recall and b) demonstrate use of at least 3 attention strategies (I.e. environmental modifications, self monitoring, self coaching, frequent breaks).   Baseline:  Goal status: INITIAL  3.  Pt will report improved cognitive communication via PROM by 5 points at last ST session    Baseline:  Goal status: INITIAL     ASSESSMENT:  CLINICAL IMPRESSION: Patient is a 42 y.o. old right handed male who was seen today for a cognitive communication therapy session d/t presumed anoxic brain injury. Pt presents with behaviors consistent with [place] Level VI (Confused, appropriate: Moderate Assistance). Pt benefited from moderate assistance throughout activities. See the above treatment note for details.   OBJECTIVE IMPAIRMENTS include attention, memory, awareness, and executive functioning. These impairments are limiting patient from return to work, managing medications, managing appointments, managing finances, household responsibilities, ADLs/IADLs, and effectively communicating at home and in community. Factors affecting potential to achieve goals and functional outcome are ability to learn/carryover information, co-morbidities, medical prognosis, previous level of function, severity of impairments, and family/community support. Patient will benefit  from skilled SLP services to address above impairments and improve overall function.  REHAB POTENTIAL: Good  PLAN: SLP FREQUENCY: 1-2x/week  SLP DURATION: 12 weeks  PLANNED INTERVENTIONS: Environmental controls, Cueing hierachy, Cognitive reorganization, Internal/external aids, Functional tasks, SLP instruction and feedback, Compensatory strategies, and Patient/family education   Aven Christen B. Dreama Saa, M.S., CCC-SLP, Tree surgeon Certified Brain Injury Specialist [facility]  [facility] Office  [phone] Ascom [phone] Fax [phone]

## 2023-05-08 ENCOUNTER — Ambulatory Visit: Payer: MEDICAID | Attending: Family Medicine

## 2023-05-08 ENCOUNTER — Ambulatory Visit: Payer: MEDICAID | Admitting: Occupational Therapy

## 2023-05-08 ENCOUNTER — Ambulatory Visit: Payer: MEDICAID | Admitting: Speech Pathology

## 2023-05-08 DIAGNOSIS — R4189 Other symptoms and signs involving cognitive functions and awareness: Secondary | ICD-10-CM

## 2023-05-08 DIAGNOSIS — R531 Weakness: Secondary | ICD-10-CM | POA: Diagnosis present

## 2023-05-08 DIAGNOSIS — R41841 Cognitive communication deficit: Secondary | ICD-10-CM

## 2023-05-08 DIAGNOSIS — R262 Difficulty in walking, not elsewhere classified: Secondary | ICD-10-CM | POA: Insufficient documentation

## 2023-05-08 DIAGNOSIS — G929 Unspecified toxic encephalopathy: Secondary | ICD-10-CM

## 2023-05-08 DIAGNOSIS — R278 Other lack of coordination: Secondary | ICD-10-CM | POA: Diagnosis present

## 2023-05-08 DIAGNOSIS — M6281 Muscle weakness (generalized): Secondary | ICD-10-CM

## 2023-05-08 DIAGNOSIS — R2681 Unsteadiness on feet: Secondary | ICD-10-CM | POA: Diagnosis present

## 2023-05-08 DIAGNOSIS — R2689 Other abnormalities of gait and mobility: Secondary | ICD-10-CM | POA: Insufficient documentation

## 2023-05-08 NOTE — Therapy (Signed)
OUTPATIENT SPEECH LANGUAGE PATHOLOGY  COGNITION TREATMENT   Patient Name: Randall Parsons MRN: [id number] DOB:1981/02/27, 42 y.o., male Today's Date: 05/08/2023  PCP: [facility] REFERRING PROVIDER: Cheri Kearns, MD   End of Session - 05/08/23 0914     Visit Number 5    Number of Visits 25    Date for SLP Re-Evaluation 07/18/23    Authorization Type [employer]    Progress Note Due on Visit 10    SLP Start Time 0930    SLP Stop Time  1015    SLP Time Calculation (min) 45 min    Activity Tolerance Patient tolerated treatment well             Past Medical History:  Diagnosis Date   Anxiety    Asthma    CHF (congestive heart failure) (HCC)    Depression    Exposure to hepatitis B    Exposure to hepatitis C    Hepatitis C    History of kidney stones    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, ending around 2017   Hypertension    IV drug abuse (HCC)    Myocardial infarction (HCC)    Renal disorder    kidney stones   Stroke [facility])    Past Surgical History:  Procedure Laterality Date   APPENDECTOMY     EYE SURGERY     secondary to dog bite   I & D EXTREMITY Right 01/31/2023   Procedure: IRRIGATION AND DEBRIDEMENT INDEX FINGER;  Surgeon: Betha Loa, MD;  Location: [facility] OR;  Service: Orthopedics;  Laterality: Right;   TIBIA FRACTURE SURGERY Right    Patient Active Problem List   Diagnosis Date Noted   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/01/2023   Abscess of finger 01/31/2023   Hypokalemia 01/31/2023   Paranoid delusion (HCC) 01/31/2023   History of hepatitis C 01/31/2023   Toxic encephalopathy 01/10/2023   MDD (major depressive disorder), recurrent episode, severe (HCC) 12/22/2021   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) 12/18/2021   Left wrist pain 08/27/2021   Bronchospasm 02/18/2021   COVID-19 vaccine dose declined 12/20/2020   Hepatitis C test positive  12/20/2020   Prolactin increased 12/20/2020   Advance care planning 12/20/2020   GAD (generalized anxiety disorder) 04/04/2020   Attention deficit hyperactivity disorder, combined type 04/04/2020   Opioid type dependence, continuous (HCC) 04/04/2020   Major depressive disorder 07/14/2019   Alcohol abuse 07/14/2019   Cocaine abuse (HCC) 07/12/2019   Low back pain 11/28/2017   NICM (nonischemic cardiomyopathy) (HCC) 11/28/2017   Renal stone 11/28/2017   History of seizure 03/14/2017   IVDU (intravenous drug user) 03/14/2017   Other specified abnormal immunological findings in serum 12/21/2015    ONSET DATE: 01/10/2023; date of referral 04/24/2023   REFERRING DIAG: G92.9 (ICD-10-CM) - Unspecified toxic encephalopathy   THERAPY DIAG:  Cognitive communication deficit  Impaired cognition  Unspecified toxic encephalopathy  Rationale for Evaluation and Treatment Rehabilitation  SUBJECTIVE:   PERTINENT HISTORY: Pt is a right handed 42 year old male with past medical history of polysubstance abuse, Hep C, IVDA, depression, ETOH, substance induced mood disorder who has history of hypoxic respiratory failure  and toxic encephalopathy cause by multiple drug overdoses. After discharging from [facility] on 02/02/2023 he was hospitalized in [place] and in a coma for multiple days (per his wife's report).  DIAGNOSTIC FINDINGS:  Head CT 01/10/2023 1.  No evidence of an acute intracranial abnormality. 2. Chronic right basal ganglia lacunar infarct.  PAIN:  Are you having pain? No   FALLS: Has patient fallen in last 6 months?  No  LIVING ENVIRONMENT: Lives with: lives with their family Lives in: House/apartment  PLOF:  Level of assistance: Needed assistance with ADLs, Needed assistance with IADLS Employment: Other: not employed at this time   PATIENT GOALS   to get his ability to perform tasks back  SUBJECTIVE STATEMENT: Pt brought in his homework, majority completed Pt  accompanied by: alone  OBJECTIVE:   TODAY'S TREATMENT:  Skilled treatment session focused on pt's cognitive communication goals. SLP facilitated session by providing the following interventions:  Pt arrived to session with majority of homework completed. Pt indicated that he would enjoy completing the word search  Moderate faded to rare Min A for scanning row by row Moderate faded to rare Min A for impulsive quickness Moderate A for selective A to task in a mildly distracting environment - at one point pt began talking about a conversation that was happening outside of pt's office Muchh improved accuracy and efficiency when implementing theses strategies   PATIENT EDUCATION: Education details: see above Person educated: Patient and Spouse Education method: Explanation Education comprehension: needs further education   HOME EXERCISE PROGRAM:   Complete previous homework (mazes)   GOALS:  Goals reviewed with patient? Yes  SHORT TERM GOALS: Target date: 10 sessions  Pt will complete cognitive communication assessment for determination of ST goals and POC.  Baseline: Goal status: INITIAL; MET  2.   With Rare Min A, patient will use external aid for functional recall of completed/upcoming activities 80% accuracy.     Baseline:  Goal status: INITIAL  3.  Pt will complete HEP in 5 out of 7 opportunities.  Baseline:  Goal status: INITIAL  4.  With minimal assistance, pt with maintain selective attention to semi-complex problem solving tasks for 15 minutes.  Baseline:  Goal status: INITIAL    LONG TERM GOALS: Target date: 07/18/2023  With Moderate A, patient will use strategies to improve memory for important information with 90% acc.(ie., white board, daily planner/calendar, Apps on phone).   Baseline:  Goal status: INITIAL  2.  With Rare Min A, patient will a) recall and b) demonstrate use of at least 3 attention strategies (I.e. environmental modifications, self  monitoring, self coaching, frequent breaks).   Baseline:  Goal status: INITIAL  3.  Pt will report improved cognitive communication via PROM by 5 points at last ST session    Baseline:  Goal status: INITIAL     ASSESSMENT:  CLINICAL IMPRESSION: Patient is a 42 y.o. old right handed male who was seen today for a cognitive communication therapy session d/t presumed anoxic brain injury. Pt presents with behaviors consistent with Rancho Level VI (Confused, appropriate: Moderate Assistance). Pt continues to be very eager, responds well to therapy activities. See the above treatment note for details.   OBJECTIVE IMPAIRMENTS include attention, memory, awareness, and executive functioning. These impairments are limiting patient from return to work, managing medications, managing appointments, managing finances, household responsibilities, ADLs/IADLs, and effectively communicating at home and in community. Factors affecting potential to achieve goals and functional outcome are ability to learn/carryover information, co-morbidities, medical prognosis, previous level of function, severity of impairments, and family/community support. Patient will benefit from skilled SLP services to address above impairments and improve overall function.  REHAB POTENTIAL: Good  PLAN: SLP FREQUENCY: 1-2x/week  SLP DURATION:  12 weeks  PLANNED INTERVENTIONS: Environmental controls, Cueing hierachy, Cognitive reorganization, Internal/external aids, Functional tasks, SLP instruction and feedback, Compensatory strategies, and Patient/family education   Shaheim Mahar B. Dreama Saa, M.S., CCC-SLP, Tree surgeon Certified Brain Injury Specialist [facility]  [facility] Office [phone] Ascom [phone] Fax [phone]

## 2023-05-08 NOTE — Therapy (Signed)
OUTPATIENT PHYSICAL THERAPY NEURO TREATMENT   Patient Name: Randall Parsons MRN: [id number] DOB:04/26/1981, 42 y.o., male Today's Date: 05/08/2023   PCP: Alease Medina, MD  REFERRING PROVIDER: Alease Medina, MD   END OF SESSION:  PT End of Session - 05/08/23 1010     Visit Number 5    Number of Visits 24    Date for PT Re-Evaluation 07/14/23    Progress Note Due on Visit 10    PT Start Time 1015    PT Stop Time 1055    PT Time Calculation (min) 40 min    Equipment Utilized During Treatment Gait belt    Activity Tolerance Patient tolerated treatment well    Behavior During Therapy WFL for tasks assessed/performed              Past Medical History:  Diagnosis Date   Anxiety    Asthma    CHF (congestive heart failure) (HCC)    Depression    Exposure to hepatitis B    Exposure to hepatitis C    Hepatitis C    History of kidney stones    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, ending around 2017   Hypertension    IV drug abuse (HCC)    Myocardial infarction (HCC)    Renal disorder    kidney stones   Stroke [facility])    Past Surgical History:  Procedure Laterality Date   APPENDECTOMY     EYE SURGERY     secondary to dog bite   I & D EXTREMITY Right 01/31/2023   Procedure: IRRIGATION AND DEBRIDEMENT INDEX FINGER;  Surgeon: Betha Loa, MD;  Location: [facility] OR;  Service: Orthopedics;  Laterality: Right;   TIBIA FRACTURE SURGERY Right    Patient Active Problem List   Diagnosis Date Noted   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/01/2023   Abscess of finger 01/31/2023   Hypokalemia 01/31/2023   Paranoid delusion (HCC) 01/31/2023   History of hepatitis C 01/31/2023   Toxic encephalopathy 01/10/2023   MDD (major depressive disorder), recurrent episode, severe (HCC) 12/22/2021   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) 12/18/2021   Left wrist pain 08/27/2021   Bronchospasm 02/18/2021    COVID-19 vaccine dose declined 12/20/2020   Hepatitis C test positive 12/20/2020   Prolactin increased 12/20/2020   Advance care planning 12/20/2020   GAD (generalized anxiety disorder) 04/04/2020   Attention deficit hyperactivity disorder, combined type 04/04/2020   Opioid type dependence, continuous (HCC) 04/04/2020   Major depressive disorder 07/14/2019   Alcohol abuse 07/14/2019   Cocaine abuse (HCC) 07/12/2019   Low back pain 11/28/2017   NICM (nonischemic cardiomyopathy) (HCC) 11/28/2017   Renal stone 11/28/2017   History of seizure 03/14/2017   IVDU (intravenous drug user) 03/14/2017   Other specified abnormal immunological findings in serum 12/21/2015    ONSET DATE: 01/10/2023  REFERRING DIAG: G92.9 (ICD-10-CM) - Unspecified toxic encephalopathy   THERAPY DIAG:  Other lack of coordination  Muscle weakness (generalized)  Unspecified toxic encephalopathy  Impaired cognition  Rationale for Evaluation and Treatment: Rehabilitation  SUBJECTIVE:  SUBJECTIVE STATEMENT: Patient reports doing okay without any new issues- States enjoyed the resistive walking last visit.     Pt accompanied by: significant other "[name]"   PERTINENT HISTORY: Pt admitted to hospital after He was found at a hotel on 4/5 by staff unresponsive, apneic with pinpoint pupils. He was given Narcan in the field, after this became combative and staff was unable to redirect him. On arrival to the ER he remained combative, his jaw was clenched, he was hypoxic, and ultimately intubated for airway protection. Admitted with Acute hypoxic respiratory failure secondary to aspiration pneumonia, pulmonary edema, NSTEMI, in the setting of acute toxic encephalopathy caused by recreational drug overdose, history of IVDA in the past. At  hospital d/c on 4/13 on room air, will be discharged on rescue inhaler 3 more days of oral Levaquin with outpatient PCP follow-up.  Pt has has 2 subsequent ED visits for finger Abscess 01/2023 and Peg tub removal on 6/26 due to pain at peg site. From ED visit "PEG tube removed without difficulty. Dressings given. Outpatient follow-up with GI and hospice. Discharge home."  Per PT referral, pt has continued to have BLE with intermittent difficulty walking as well as getting up from toilet.   PAIN:  Are you having pain? Yes: NPRS scale: 8/10 Pain location: Neck  Pain description: stiffness  Aggravating factors: sleeping  Relieving factors: Chiropractor   PRECAUTIONS: Fall  RED FLAGS: None   WEIGHT BEARING RESTRICTIONS: No  FALLS: Has patient fallen in last 6 months? No   PATIENT GOALS: get back to "normal" get back to work if able. Improve balance    OBJECTIVE:    TODAY'S TREATMENT:                                                                                                                              DATE: 05/08/23    NMR Ambulate in hospital with 4# AW- including PT leading way for 1/2 trip then patient led way back with 100% accuracy. Patient ambulated approx 15 min total time.   TE Nustep L1 for warm up then L3 x 1 min; L5 x 1 min then back down to L3  x 1 min and L1 x 1 min- VC to keep SPM > 50 and continuous monitoring for cardiovascular response. Total distance = 0.18 mi  Step up with 4# AW onto 6" block 2 sets  x 15 reps alt LE ( VC for alternating)   Side stepping  with GTB in // bars left to right then back x6.        PATIENT EDUCATION: Education details: POC, Clinic Orientation. PT evaluation  Person educated: Patient and Spouse Education method: Explanation and Demonstration Education comprehension: verbalized understanding  HOME EXERCISE PROGRAM:  Access Code: [email or web address] URL: [email or web address] Date: 04/30/2023 Prepared by: Maureen Ralphs  Exercises - Sit to Stand with Arms Crossed  - 1 x daily - 3 x weekly - 3 sets - 10  reps - Forward Step Up with Counter Support  - 1 x daily - 3 x weekly - 3 sets - 10 reps - Side Stepping with Resistance at Feet  - 1 x daily - 3 x weekly - 3 sets - 10 reps   GOALS: Goals reviewed with patient? Yes   SHORT TERM GOALS: Target date: 05/27/2023    Patient will be independent in home exercise program to improve strength/mobility for better functional independence with ADLs. Baseline: to be given at next session  Goal status: INITIAL   LONG TERM GOALS: Target date: 07/14/2023    Patient will increase FOTO score to equal to or greater than 65    to demonstrate statistically significant improvement in mobility and quality of life.  Baseline: 61 Goal status: INITIAL  2. Pt will increase 6 min walk test by >250ft to demonstrate reduced fall risk and improved access to community.   Baseline: to be completed at next visit  Goal status : Initial    3.  Patient will increase Berg Balance score by > 6 points to demonstrate decreased fall risk during functional activities Baseline: 49 Goal status: INITIAL  4.  Patient will increase 10 meter walk test to >1.72m/s as to improve gait speed for better community ambulation and to reduce fall risk. Baseline: 0.47m/s Goal status: INITIAL  5.  Patient will reduce timed up and go to <11 seconds to reduce fall risk and demonstrate improved transfer/gait ability. Baseline: 12sec Goal status: INITIAL  6.  Patient will increase dynamic gait index score to >19/24 as to demonstrate reduced fall risk and improved dynamic gait balance for better safety with community/home ambulation.   Baseline: 18 Goal status: INITIAL   ASSESSMENT:  CLINICAL IMPRESSION: Patient performed well today and demo good overall cognitive recall with direction during walking. He was limited slightly by fatigue using resistive ankle weight with ambulation yet no  LOB today. He was also fatigued with Nustep and will benefit from interval workout to assist in developed muscular endurance. Pt will benefit from skilled PT to address strength, balance and coordination deficits to improve overall QoL and allow return to PLOF.    OBJECTIVE IMPAIRMENTS: Abnormal gait, cardiopulmonary status limiting activity, decreased balance, decreased cognition, decreased coordination, decreased endurance, decreased knowledge of condition, decreased knowledge of use of DME, decreased mobility, decreased strength, decreased safety awareness, impaired vision/preception, improper body mechanics, and postural dysfunction.   ACTIVITY LIMITATIONS: carrying, lifting, standing, squatting, stairs, and locomotion level  PARTICIPATION LIMITATIONS: cleaning, laundry, medication management, personal finances, driving, shopping, community activity, occupation, and yard work  PERSONAL FACTORS: Age, Behavior pattern, Education, and Fitness are also affecting patient's functional outcome.   REHAB POTENTIAL: Excellent  CLINICAL DECISION MAKING: Stable/uncomplicated  EVALUATION COMPLEXITY: Moderate  PLAN:  PT FREQUENCY: 1-2x/week  PT DURATION: 12 weeks  PLANNED INTERVENTIONS: Therapeutic exercises, Therapeutic activity, Neuromuscular re-education, Balance training, Gait training, Patient/Family education, Self Care, Joint mobilization, Stair training, Vestibular training, DME instructions, Cognitive remediation, Electrical stimulation, Wheelchair mobility training, Cryotherapy, Moist heat, scar mobilization, Splintting, Taping, Biofeedback, Manual therapy, and Re-evaluation  PLAN FOR NEXT SESSION:  Conitnued to promote return to gym    Lenda Kelp, PT 05/08/2023, 5:31 PM  5:31 PM, 05/08/23 Physical Therapist - [facility]  Outpatient Physical Therapy- Main Campus [phone]

## 2023-05-08 NOTE — Therapy (Addendum)
OUTPATIENT OCCUPATIONAL THERAPY NEURO TREATMENT NOTE  Patient Name: Randall Parsons MRN: [id number] DOB:10/05/1981, 42 y.o., male Today's Date: 05/08/2023  PCP: Dr. Cheri Kearns REFERRING PROVIDER: Hillery Aldo  END OF SESSION:  OT End of Session - 05/08/23 1040     Visit Number 5    Number of Visits 24    Date for OT Re-Evaluation 07/14/23    OT Start Time 0911    OT Stop Time 0930    OT Time Calculation (min) 19 min    Activity Tolerance Patient tolerated treatment well    Behavior During Therapy [facility] for tasks assessed/performed            Past Medical History:  Diagnosis Date   Anxiety    Asthma    CHF (congestive heart failure) (HCC)    Depression    Exposure to hepatitis B    Exposure to hepatitis C    Hepatitis C    History of kidney stones    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, ending around 2017   Hypertension    IV drug abuse (HCC)    Myocardial infarction (HCC)    Renal disorder    kidney stones   Stroke 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/01/2023   Abscess of finger 01/31/2023   Hypokalemia 01/31/2023   Paranoid delusion (HCC) 01/31/2023   History of hepatitis C 01/31/2023   Toxic encephalopathy 01/10/2023   MDD (major depressive disorder), recurrent episode, severe (HCC) 12/22/2021   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) 12/18/2021   Left wrist pain 08/27/2021   Bronchospasm 02/18/2021   COVID-19 vaccine dose declined 12/20/2020   Hepatitis C test positive 12/20/2020   Prolactin  increased 12/20/2020   Advance care planning 12/20/2020   GAD (generalized anxiety disorder) 04/04/2020   Attention deficit hyperactivity disorder, combined type 04/04/2020   Opioid type dependence, continuous (HCC) 04/04/2020   Major depressive disorder 07/14/2019   Alcohol abuse 07/14/2019   Cocaine abuse (HCC) 07/12/2019   Low back pain 11/28/2017   NICM (nonischemic cardiomyopathy) (HCC) 11/28/2017   Renal stone 11/28/2017   History of seizure 03/14/2017   IVDU (intravenous drug user) 03/14/2017   Other specified abnormal immunological findings in serum 12/21/2015    ONSET DATE: 01/11/2023  REFERRING DIAG: Toxic encephalopathy  THERAPY DIAG:  Muscle weakness (generalized)  Other lack of coordination  Unspecified toxic encephalopathy  Impaired cognition  Rationale for Evaluation and Treatment: Rehabilitation  SUBJECTIVE:   SUBJECTIVE STATEMENT: Pt reports that he is doing well today. Pt. Reports he was running late today due to transportation issues. Pt. Reports he has been going to the chiropractor and it seems to be helping him.  Pt accompanied by: significant other  PERTINENT HISTORY: Pt. Is a 42 y.o. male who was admitted to the hospital on 01/10/2023 due to being found at a hotel unresponsive and apneic with pinpoint pupil. Pt. Was intubated for airway protection. Pt. Was admitted with acute hypoxic respiratory failure secondary  to aspiration pneumonia, pulmonary edema, NSTEMI, and acute toxic encephalopathy cause by recreational drug overdose. Pt. has a history of polysubstance abuse, Hep C, IVDA, depression, ETOH, substance induced mood disorder.   PRECAUTIONS: No drinking with a straw due to swallowing issues  WEIGHT BEARING RESTRICTIONS: No  PAIN:  Are you having pain? 7/10 neck pain   FALLS: Has patient fallen in last 6 months? No  LIVING ENVIRONMENT: Lives with: lives with their family Lives in: House/apartment Stairs: Yes: Internal: 15 steps; on left  going up however Pt. Reports he does not go upstairs very often because his bedroom is on main level, 1 external step to get into the house with no rails Has following equipment at home: None  PLOF: Independent  PATIENT GOALS: Completing tasks such as making a bowl of cereal, balance, making the bed, using the phone  OBJECTIVE:   HAND DOMINANCE: Right  ADLs: Eating: Increased time, Has to think through the tasks when completing them now Grooming: Able to use razor safely however requires increased time to think through UB Dressing: Able to put shirt on however sometimes experiences difficulties when putting his arms through the holes LB Dressing: Able to put pants on however sometimes puts pants on backwards and requires him to switch them to proper position.  Toileting: Able to sit down on toilet and clean self however, struggles getting up by your self at times, however wife has not helped in a week with tolieting and reports he has done well. Bathing: Independent, sometimes have difficulties remembering what they washed  Tub Shower transfers: Independent Equipment:  None  IADLs: Shopping: Wife mostly does grocery shopping, Pt. Will sometime goes with spouse Light housekeeping: Difficulty making up the bed Meal Prep: Wife does most of the cooking, pt. Able to prepare light meal Community mobility: Towards end of day Medication management: Not taking any medicine currently Financial management: Wife does Handwriting: 100% legible  MOBILITY STATUS: Independent  POSTURE COMMENTS:  No Significant postural limitations Sitting balance:  WFL  ACTIVITY TOLERANCE: Activity tolerance: WFL  FUNCTIONAL OUTCOME MEASURES: FOTO: 63 TR:63  UPPER EXTREMITY ROM:    Active ROM Right WFL Left WFL  Shoulder flexion    Shoulder abduction    Shoulder adduction    Shoulder extension    Shoulder internal rotation    Shoulder external rotation    Elbow flexion    Elbow extension     Wrist flexion    Wrist extension    Wrist ulnar deviation    Wrist radial deviation    Wrist pronation    Wrist supination    (Blank rows = not tested)  UPPER EXTREMITY MMT:     MMT Right eval Left eval  Shoulder flexion 4+/5 4/5  Shoulder abduction 4+/5 4/5  Shoulder adduction    Shoulder extension    Shoulder internal rotation    Shoulder external rotation    Middle trapezius    Lower trapezius    Elbow flexion 5/5 4+/5  Elbow extension 5/5 4+/5  Wrist flexion 5/5 4/5  Wrist extension 5/5 4/5  Wrist ulnar deviation    Wrist radial deviation    Wrist pronation    Wrist supination    (Blank rows = not tested)  HAND FUNCTION: Grip strength: Right: 50 lbs; Left: 36 lbs, Lateral pinch: Right: 15 lbs, Left: 13 lbs, and 3 point pinch: Right: 15 lbs, Left: 10 lbs  COORDINATION: 9 Hole Peg test: Right: 28 sec; Left: 32 sec  SENSATION:To be  assessed further in functional contexts  EDEMA: None  MUSCLE TONE: None  COGNITION: Overall cognitive status: Impaired  VISION: Subjective report: Pt. Reports vision has gotten worse since the brain damage and unable to read his bible sometimes in church. Pt. Was advised to go to eye doctor due to visual changes present. Baseline vision: Wears glasses for reading only Visual history: brain injury  VISION ASSESSMENT: To be further assessed in functional context  Patient has difficulty with following activities due to following visual impairments: Reading his bible at church  PERCEPTION: To be assessed further in functional contexts  PRAXIS: [facility]  TODAY'S TREATMENT:                                                                                                                              DATE: 05/08/2023  Therapeutic Exercise:    Pt. Worked on BUE strengthening and reciprocal motion using the SCIFit set to 3.3 workload level for 8 minutes. Pt. Worked on L hand grip strength using the hand grip strengthener to remove the jumbo  pegs from the peg board. Pt. Worked on grasping jumbo pegs and storing as many as he could within his hand before placing them onto the peg board. Hand grip strengthener was set to 23.4# x 1 trial.  PATIENT EDUCATION: Education details: compensatory dressing strategies  Person educated: Patient and Spouse Education method: explanation, vc Education comprehension: verbalized understanding  HOME EXERCISE PROGRAM: Theraputty exercises for L hand strengthing with green resistive theraputty  GOALS: Goals reviewed with patient? Yes  SHORT TERM GOALS: Target date: 07/03/2023  Pt. Will improve FOTO score by 2 points to reflect improved perceived function performance in specific ADLS/IADLs.  Baseline: Eval: FOTO: 63 TR: 63 Goal status: INITIAL   LONG TERM GOALS: Target date: 07/14/2023  Pt. Will improve L shoulder strength by 1 mm grade to be able to sustain UE elevation while completing overhead reaching tasks.  Baseline: EVAL: L: Shoulder flexion: 4/5, Shoulder abduction: 4/5 Goal status: INITIAL  2.  Pt will increase L hand grip strength by 5# to be able to securely hold objects, open containers, and jars. Baseline: Eval: Grip strength: Right: 50 lbs; Left: 36 lbs Goal status: INITIAL  3.  Pt. Will independently make up his bed following the proper sequence of  steps needed to complete the task 100% of the time.  Baseline: Eval: Pt. requires increased time, and assist to sequence through the steps of bed making tasks. Goal status: INITIAL  4.  Pt. Will improve L [facility] skills by 2 seconds of speed to independently manipulate small objects during ADL/IADL tasks.  Baseline: Eval: 9 Hole Peg test: Right: 28 sec; Left: 32 sec Goal status: INITIAL  5. Pt. will independently identify, or demonstrate compensatory strategies for donning UE, and LE clothing in the correct direction. Baseline: Eval: Pt. Puts clothing on backwards, inside out, on a different body part, or out of sequence. Goal  status: INITIAL  ASSESSMENT:  CLINICAL IMPRESSION:  Pt. Was seen for a short OT session, as he was running late to therapy due to transportation issues. No appointment times were available to reschedule for a later time this morning. Pt. reports still wanting  to participate in therapy for the remaining time available. Pt. Reports he has been doing using theraputty for hand strengthening exercises at home. Pt. tolerated all therapeutic exercise well this date. Pt. Was able to increase from 2.5 the last time to level 3.3 resistance on the SCIFit. Pt. Reports that he feels like his LUE is getting stronger. Pt. was able to use the hand grip strengthener set to 23.4# to remove jumbo pegs from peg board. Pt. Required increased verbal cues to ensure he was picking up one peg at a time and storing as many as he could within his hand before discarding them onto the peg board. Pt will continue to benefit from skilled OT services to improve sequencing skills, [facility] skills, and LUE strength to increase independence in ADL/IADL tasks.   PERFORMANCE DEFICITS: in functional skills including ADLs, IADLs, coordination, strength, pain, Fine motor control, Gross motor control, balance, and UE functional use, cognitive skills including sequencing, and psychosocial skills including coping strategies, environmental adaptation, and routines and behaviors.   IMPAIRMENTS: are limiting patient from ADLs, IADLs, and work.   CO-MORBIDITIES: may have co-morbidities  that affects occupational performance. Patient will benefit from skilled OT to address above impairments and improve overall function.  MODIFICATION OR ASSISTANCE TO COMPLETE EVALUATION: No modification of tasks or assist necessary to complete an evaluation.  OT OCCUPATIONAL PROFILE AND HISTORY: Detailed assessment: Review of records and additional review of physical, cognitive, psychosocial history related to current functional performance.  CLINICAL DECISION  MAKING: Moderate - several treatment options, min-mod task modification necessary  REHAB POTENTIAL: Good  EVALUATION COMPLEXITY: Moderate    PLAN:  OT FREQUENCY: 2x/week  OT DURATION: 12 weeks  PLANNED INTERVENTIONS: self care/ADL training, therapeutic exercise, therapeutic activity, neuromuscular re-education, patient/family education, and cognitive remediation/compensation  RECOMMENDED OTHER SERVICES: ST, PT  CONSULTED AND AGREED WITH PLAN OF CARE: Patient and family member/caregiver  PLAN FOR NEXT SESSION: Initiate treatment  Herma Carson, OTS 10:52 AM, 05/08/2023  This entire session was performed under the direct supervision and direction of a licensed therapist. I have personally read, edited, and approve of the note as written.   Olegario Messier, MS, OTR/L  05/08/2023

## 2023-05-12 ENCOUNTER — Ambulatory Visit: Payer: MEDICAID | Admitting: Occupational Therapy

## 2023-05-12 ENCOUNTER — Ambulatory Visit: Payer: MEDICAID | Admitting: Speech Pathology

## 2023-05-12 ENCOUNTER — Ambulatory Visit: Payer: MEDICAID

## 2023-05-12 ENCOUNTER — Encounter: Payer: Self-pay | Admitting: Occupational Therapy

## 2023-05-12 DIAGNOSIS — R2689 Other abnormalities of gait and mobility: Secondary | ICD-10-CM

## 2023-05-12 DIAGNOSIS — R2681 Unsteadiness on feet: Secondary | ICD-10-CM

## 2023-05-12 DIAGNOSIS — R262 Difficulty in walking, not elsewhere classified: Secondary | ICD-10-CM

## 2023-05-12 DIAGNOSIS — R531 Weakness: Secondary | ICD-10-CM

## 2023-05-12 DIAGNOSIS — R278 Other lack of coordination: Secondary | ICD-10-CM

## 2023-05-12 DIAGNOSIS — R4189 Other symptoms and signs involving cognitive functions and awareness: Secondary | ICD-10-CM

## 2023-05-12 DIAGNOSIS — R41841 Cognitive communication deficit: Secondary | ICD-10-CM

## 2023-05-12 DIAGNOSIS — G929 Unspecified toxic encephalopathy: Secondary | ICD-10-CM

## 2023-05-12 DIAGNOSIS — M6281 Muscle weakness (generalized): Secondary | ICD-10-CM
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
## 2023-05-12 NOTE — Therapy (Signed)
OUTPATIENT SPEECH LANGUAGE PATHOLOGY  COGNITION TREATMENT   Patient Name: Randall Parsons MRN: [id number] DOB:Dec 24, 1980, 42 y.o., male Today's Date: 05/12/2023  PCP: [facility] REFERRING PROVIDER: Cheri Kearns, MD   End of Session - 05/12/23 1108     Visit Number 6    Number of Visits 25    Date for SLP Re-Evaluation 07/18/23    Authorization Type [employer]    Progress Note Due on Visit 10    SLP Start Time 0930    SLP Stop Time  1020    SLP Time Calculation (min) 50 min    Activity Tolerance Patient tolerated treatment well             Past Medical History:  Diagnosis Date   Anxiety    Asthma    CHF (congestive heart failure) (HCC)    Depression    Exposure to hepatitis B    Exposure to hepatitis C    Hepatitis C    History of kidney stones    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, ending around 2017   Hypertension    IV drug abuse (HCC)    Myocardial infarction (HCC)    Renal disorder    kidney stones   Stroke [facility])    Past Surgical History:  Procedure Laterality Date   APPENDECTOMY     EYE SURGERY     secondary to dog bite   I & D EXTREMITY Right 01/31/2023   Procedure: IRRIGATION AND DEBRIDEMENT INDEX FINGER;  Surgeon: Betha Loa, MD;  Location: [facility] OR;  Service: Orthopedics;  Laterality: Right;   TIBIA FRACTURE SURGERY Right    Patient Active Problem List   Diagnosis Date Noted   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/01/2023   Abscess of finger 01/31/2023   Hypokalemia 01/31/2023   Paranoid delusion (HCC) 01/31/2023   History of hepatitis C 01/31/2023   Toxic encephalopathy 01/10/2023   MDD (major depressive disorder), recurrent episode, severe (HCC) 12/22/2021   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) 12/18/2021   Left wrist pain 08/27/2021   Bronchospasm 02/18/2021   COVID-19 vaccine dose declined 12/20/2020   Hepatitis C test positive  12/20/2020   Prolactin increased 12/20/2020   Advance care planning 12/20/2020   GAD (generalized anxiety disorder) 04/04/2020   Attention deficit hyperactivity disorder, combined type 04/04/2020   Opioid type dependence, continuous (HCC) 04/04/2020   Major depressive disorder 07/14/2019   Alcohol abuse 07/14/2019   Cocaine abuse (HCC) 07/12/2019   Low back pain 11/28/2017   NICM (nonischemic cardiomyopathy) (HCC) 11/28/2017   Renal stone 11/28/2017   History of seizure 03/14/2017   IVDU (intravenous drug user) 03/14/2017   Other specified abnormal immunological findings in serum 12/21/2015    ONSET DATE: 01/10/2023; date of referral 04/24/2023   REFERRING DIAG: G92.9 (ICD-10-CM) - Unspecified toxic encephalopathy   THERAPY DIAG:  Cognitive communication deficit  Unspecified toxic encephalopathy  Rationale for Evaluation and Treatment Rehabilitation  SUBJECTIVE:   PERTINENT HISTORY: Pt is a right handed 42 year old male with past medical history of polysubstance abuse, Hep C, IVDA, depression, ETOH, substance induced mood disorder who has history of hypoxic respiratory failure  and toxic encephalopathy cause by multiple drug overdoses. After discharging from [facility] on 02/02/2023 he was hospitalized in [place] and in a coma for multiple days (per his wife's report).  DIAGNOSTIC FINDINGS:  Head CT 01/10/2023 1.  No evidence of  an acute intracranial abnormality. 2. Chronic right basal ganglia lacunar infarct.  PAIN:  Are you having pain? No   FALLS: Has patient fallen in last 6 months?  No  LIVING ENVIRONMENT: Lives with: lives with their family Lives in: House/apartment  PLOF:  Level of assistance: Needed assistance with ADLs, Needed assistance with IADLS Employment: Other: not employed at this time   PATIENT GOALS   to get his ability to perform tasks back  SUBJECTIVE STATEMENT: Pt brought in his homework, majority completed Pt accompanied by:  alone  OBJECTIVE:   TODAY'S TREATMENT:  Skilled treatment session focused on pt's cognitive communication goals. SLP facilitated session by providing the following interventions:  Pt arrived to session with majority of homework completed. Pt indicated that he would enjoy completing remainder of the word search and a sequencing task (WALC 5, p. 75, 19-24)  Moderate faded to rare Min A for scanning row by row Moderate faded to rare Min A for impulsive quickness Initial cueing to read items aloud and double check errors Moderate A for selective A to task in a mildly distracting environment - pt easily distracted by own thoughts and environmental stimuli  PATIENT EDUCATION: Education details: see above Person educated: Patient and Spouse Education method: Explanation Education comprehension: needs further education   HOME EXERCISE PROGRAM:   Complete additional wordsearch   GOALS:  Goals reviewed with patient? Yes  SHORT TERM GOALS: Target date: 10 sessions  Pt will complete cognitive communication assessment for determination of ST goals and POC.  Baseline: Goal status: INITIAL; MET  2.   With Rare Min A, patient will use external aid for functional recall of completed/upcoming activities 80% accuracy.     Baseline:  Goal status: INITIAL  3.  Pt will complete HEP in 5 out of 7 opportunities.  Baseline:  Goal status: INITIAL  4.  With minimal assistance, pt with maintain selective attention to semi-complex problem solving tasks for 15 minutes.  Baseline:  Goal status: INITIAL    LONG TERM GOALS: Target date: 07/18/2023  With Moderate A, patient will use strategies to improve memory for important information with 90% acc.(ie., white board, daily planner/calendar, Apps on phone).   Baseline:  Goal status: INITIAL  2.  With Rare Min A, patient will a) recall and b) demonstrate use of at least 3 attention strategies (I.e. environmental modifications, self monitoring,  self coaching, frequent breaks).   Baseline:  Goal status: INITIAL  3.  Pt will report improved cognitive communication via PROM by 5 points at last ST session    Baseline:  Goal status: INITIAL     ASSESSMENT:  CLINICAL IMPRESSION: Patient is a 42 y.o. old right handed male who was seen today for a cognitive communication therapy session d/t presumed anoxic brain injury. Pt presents with behaviors consistent with [place] Level VI (Confused, appropriate: Moderate Assistance). Pt continues to be very eager, responds well to therapy activities. See the above treatment note for details.   OBJECTIVE IMPAIRMENTS include attention, memory, awareness, and executive functioning. These impairments are limiting patient from return to work, managing medications, managing appointments, managing finances, household responsibilities, ADLs/IADLs, and effectively communicating at home and in community. Factors affecting potential to achieve goals and functional outcome are ability to learn/carryover information, co-morbidities, medical prognosis, previous level of function, severity of impairments, and family/community support. Patient will benefit from skilled SLP services to address above impairments and improve overall function.  REHAB POTENTIAL: Good  PLAN: SLP FREQUENCY: 1-2x/week  SLP DURATION:  12 weeks  PLANNED INTERVENTIONS: Environmental controls, Economist, Cognitive reorganization, Internal/external aids, Functional tasks, SLP instruction and feedback, Compensatory strategies, and Patient/family education   Clyde Canterbury, M.S., CCC-SLP Speech-Language Pathologist [facility] [phone] (ASCOM)

## 2023-05-12 NOTE — Therapy (Signed)
OUTPATIENT PHYSICAL THERAPY NEURO TREATMENT   Patient Name: Randall Parsons MRN: [id number] DOB:11-07-1980, 42 y.o., male Today's Date: 05/12/2023   PCP: Alease Medina, MD  REFERRING PROVIDER: Alease Medina, MD   END OF SESSION:  PT End of Session - 05/12/23 7829     Visit Number 6    Number of Visits 24    Date for PT Re-Evaluation 07/14/23    Progress Note Due on Visit 10    PT Start Time 0759    PT Stop Time 0843    PT Time Calculation (min) 44 min    Equipment Utilized During Treatment Gait belt    Activity Tolerance Patient tolerated treatment well    Behavior During Therapy WFL for tasks assessed/performed              Past Medical History:  Diagnosis Date   Anxiety    Asthma    CHF (congestive heart failure) (HCC)    Depression    Exposure to hepatitis B    Exposure to hepatitis C    Hepatitis C    History of kidney stones    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, ending around 2017   Hypertension    IV drug abuse (HCC)    Myocardial infarction (HCC)    Renal disorder    kidney stones   Stroke [facility])    Past Surgical History:  Procedure Laterality Date   APPENDECTOMY     EYE SURGERY     secondary to dog bite   I & D EXTREMITY Right 01/31/2023   Procedure: IRRIGATION AND DEBRIDEMENT INDEX FINGER;  Surgeon: Betha Loa, MD;  Location: [facility] OR;  Service: Orthopedics;  Laterality: Right;   TIBIA FRACTURE SURGERY Right    Patient Active Problem List   Diagnosis Date Noted   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/01/2023   Abscess of finger 01/31/2023   Hypokalemia 01/31/2023   Paranoid delusion (HCC) 01/31/2023   History of hepatitis C 01/31/2023   Toxic encephalopathy 01/10/2023   MDD (major depressive disorder), recurrent episode, severe (HCC) 12/22/2021   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) 12/18/2021   Left wrist pain 08/27/2021   Bronchospasm 02/18/2021    COVID-19 vaccine dose declined 12/20/2020   Hepatitis C test positive 12/20/2020   Prolactin increased 12/20/2020   Advance care planning 12/20/2020   GAD (generalized anxiety disorder) 04/04/2020   Attention deficit hyperactivity disorder, combined type 04/04/2020   Opioid type dependence, continuous (HCC) 04/04/2020   Major depressive disorder 07/14/2019   Alcohol abuse 07/14/2019   Cocaine abuse (HCC) 07/12/2019   Low back pain 11/28/2017   NICM (nonischemic cardiomyopathy) (HCC) 11/28/2017   Renal stone 11/28/2017   History of seizure 03/14/2017   IVDU (intravenous drug user) 03/14/2017   Other specified abnormal immunological findings in serum 12/21/2015    ONSET DATE: 01/10/2023  REFERRING DIAG: G92.9 (ICD-10-CM) - Unspecified toxic encephalopathy   THERAPY DIAG:  Balance disorder  Unsteadiness on feet  Difficulty in walking, not elsewhere classified  Weakness generalized  Rationale for Evaluation and Treatment: Rehabilitation  SUBJECTIVE:  SUBJECTIVE STATEMENT: Patient reports being a little sore after last session, which is "to be expected." No reports of falls since last session.    Pt accompanied by: significant other "Randall Parsons"   PERTINENT HISTORY: Pt admitted to hospital after He was found at a hotel on 4/5 by staff unresponsive, apneic with pinpoint pupils. He was given Narcan in the field, after this became combative and staff was unable to redirect him. On arrival to the ER he remained combative, his jaw was clenched, he was hypoxic, and ultimately intubated for airway protection. Admitted with Acute hypoxic respiratory failure secondary to aspiration pneumonia, pulmonary edema, NSTEMI, in the setting of acute toxic encephalopathy caused by recreational drug overdose, history of  IVDA in the past. At hospital d/c on 4/13 on room air, will be discharged on rescue inhaler 3 more days of oral Levaquin with outpatient PCP follow-up.  Pt has has 2 subsequent ED visits for finger Abscess 01/2023 and Peg tub removal on 6/26 due to pain at peg site. From ED visit "PEG tube removed without difficulty. Dressings given. Outpatient follow-up with GI and hospice. Discharge home."  Per PT referral, pt has continued to have BLE with intermittent difficulty walking as well as getting up from toilet.   PAIN:  Are you having pain? Yes: NPRS scale: 8/10 Pain location: Neck  Pain description: stiffness  Aggravating factors: sleeping  Relieving factors: Chiropractor   PRECAUTIONS: Fall  RED FLAGS: None   WEIGHT BEARING RESTRICTIONS: No  FALLS: Has patient fallen in last 6 months? No   PATIENT GOALS: get back to "normal" get back to work if able. Improve balance    OBJECTIVE:    TODAY'S TREATMENT:                                                                                                                              DATE: 05/12/23   Unless otherwise specified, gait belt and CGA applied for all exercises to ensure patient safety.  NMR Ambulate 300' with 4# AW HR 77- pt reports easy Red light/Green light 2 x 80' with 4# AW (verbal cues for safe stop); for sudden initiation/termination of ambulation- pt reports easy Ambulate with dual task: toss rainbow ball up with head follow 2 x 80' (VC for follow with head)- pt report easy Ambulate with dual task: rainbow ball toss and cognitive task 2 x 80' (movement smoother 2nd time); additional dual task of naming animals in alphabetical order Semi tandem stance on Airex pad and 4 in step with dual task: word game on whiteboard hold 2 min each side (VC for finding letters and balance cues)   TE Resisted ambulation sidesteps 17.5 lbs (VC for staying lower to maintain control and staying level, focus on eccentric control back) x 3 each  direction 15 lb kettle ball squats in front of chair (VC for positioning and form) 2 x 10 150' ambulation in between sets for active recovery  PATIENT EDUCATION: Education details: POC, Clinic Orientation. PT evaluation  Person educated: Patient and Spouse Education method: Explanation and Demonstration Education comprehension: verbalized understanding  HOME EXERCISE PROGRAM:  Access Code: [email or web address] URL: [email or web address] Date: 04/30/2023 Prepared by: Maureen Ralphs  Exercises - Sit to Stand with Arms Crossed  - 1 x daily - 3 x weekly - 3 sets - 10 reps - Forward Step Up with Counter Support  - 1 x daily - 3 x weekly - 3 sets - 10 reps - Side Stepping with Resistance at Feet  - 1 x daily - 3 x weekly - 3 sets - 10 reps   GOALS: Goals reviewed with patient? Yes   SHORT TERM GOALS: Target date: 05/27/2023    Patient will be independent in home exercise program to improve strength/mobility for better functional independence with ADLs. Baseline: to be given at next session  Goal status: INITIAL   LONG TERM GOALS: Target date: 07/14/2023    Patient will increase FOTO score to equal to or greater than 65    to demonstrate statistically significant improvement in mobility and quality of life.  Baseline: 61 Goal status: INITIAL  2. Pt will increase 6 min walk test by >286ft to demonstrate reduced fall risk and improved access to community.   Baseline: to be completed at next visit  Goal status : Initial    3.  Patient will increase Berg Balance score by > 6 points to demonstrate decreased fall risk during functional activities Baseline: 49 Goal status: INITIAL  4.  Patient will increase 10 meter walk test to >1.55m/s as to improve gait speed for better community ambulation and to reduce fall risk. Baseline: 0.26m/s Goal status: INITIAL  5.  Patient will reduce timed up and go to <11 seconds to reduce fall risk and demonstrate improved  transfer/gait ability. Baseline: 12sec Goal status: INITIAL  6.  Patient will increase dynamic gait index score to >19/24 as to demonstrate reduced fall risk and improved dynamic gait balance for better safety with community/home ambulation.   Baseline: 18 Goal status: INITIAL   ASSESSMENT:  CLINICAL IMPRESSION: Pt presents motivated to improve strength and ambulation. Ambulation with AW performed as warm up and to establish HR baseline. Pt completed ambulation with motor and cognitive dual task with mild decrease in speed but increased smoothness of gait during the second trial. Ambulation performed as active recovery between kettle bell squat sets to decrease HR while encouraging blood flow. Semi tandem stance with dual task reported to be challenging by patient, with the cognitive task being more challenging than the motor task. VC provided throughout to maintain safe form for all exercises. Pt will benefit from skilled PT to address strength, balance and coordination deficits to improve overall QoL and allow return to PLOF.    OBJECTIVE IMPAIRMENTS: Abnormal gait, cardiopulmonary status limiting activity, decreased balance, decreased cognition, decreased coordination, decreased endurance, decreased knowledge of condition, decreased knowledge of use of DME, decreased mobility, decreased strength, decreased safety awareness, impaired vision/preception, improper body mechanics, and postural dysfunction.   ACTIVITY LIMITATIONS: carrying, lifting, standing, squatting, stairs, and locomotion level  PARTICIPATION LIMITATIONS: cleaning, laundry, medication management, personal finances, driving, shopping, community activity, occupation, and yard work  PERSONAL FACTORS: Age, Behavior pattern, Education, and Fitness are also affecting patient's functional outcome.   REHAB POTENTIAL: Excellent  CLINICAL DECISION MAKING: Stable/uncomplicated  EVALUATION COMPLEXITY: Moderate  PLAN:  PT  FREQUENCY: 1-2x/week  PT DURATION: 12 weeks  PLANNED INTERVENTIONS: Therapeutic exercises, Therapeutic activity, Neuromuscular re-education,  Balance training, Gait training, Patient/Family education, Self Care, Joint mobilization, Stair training, Vestibular training, DME instructions, Cognitive remediation, Electrical stimulation, Wheelchair mobility training, Cryotherapy, Moist heat, scar mobilization, Splintting, Taping, Biofeedback, Manual therapy, and Re-evaluation  PLAN FOR NEXT SESSION:  Conitnued to promote return to gym    Precious Bard, PT 05/12/2023, 9:10 AM  9:10 AM, 05/12/23 Physical Therapist - [facility]  Outpatient Physical Therapy- Main Campus [phone]

## 2023-05-13 ENCOUNTER — Ambulatory Visit: Payer: MEDICAID

## 2023-05-15 ENCOUNTER — Ambulatory Visit: Payer: MEDICAID

## 2023-05-15 ENCOUNTER — Ambulatory Visit: Payer: MEDICAID | Admitting: Speech Pathology

## 2023-05-15 ENCOUNTER — Ambulatory Visit: Payer: MEDICAID | Admitting: Physical Therapy

## 2023-05-19 ENCOUNTER — Ambulatory Visit: Payer: MEDICAID | Admitting: Occupational Therapy

## 2023-05-19 ENCOUNTER — Ambulatory Visit: Payer: MEDICAID | Admitting: Speech Pathology

## 2023-05-19 ENCOUNTER — Encounter: Payer: Self-pay | Admitting: Occupational Therapy

## 2023-05-19 ENCOUNTER — Ambulatory Visit: Payer: MEDICAID

## 2023-05-19 DIAGNOSIS — R278 Other lack of coordination: Secondary | ICD-10-CM

## 2023-05-19 DIAGNOSIS — R41841 Cognitive communication deficit: Secondary | ICD-10-CM

## 2023-05-19 DIAGNOSIS — M6281 Muscle weakness (generalized): Secondary | ICD-10-CM

## 2023-05-19 DIAGNOSIS — R2689 Other abnormalities of gait and mobility: Secondary | ICD-10-CM

## 2023-05-19 DIAGNOSIS — R2681 Unsteadiness on feet: Secondary | ICD-10-CM

## 2023-05-19 DIAGNOSIS — G929 Unspecified toxic encephalopathy: Secondary | ICD-10-CM

## 2023-05-19 DIAGNOSIS — R262 Difficulty in walking, not elsewhere classified: Secondary | ICD-10-CM

## 2023-05-19 DIAGNOSIS — R531 Weakness: Secondary | ICD-10-CM

## 2023-05-19 DIAGNOSIS — R4189 Other symptoms and signs involving cognitive functions and awareness: Secondary | ICD-10-CM

## 2023-05-19 NOTE — Therapy (Unsigned)
OUTPATIENT SPEECH LANGUAGE PATHOLOGY  COGNITION TREATMENT   Patient Name: Randall Parsons MRN: [id number] DOB:1981/02/23, 42 y.o., male Today's Date: 05/19/2023  PCP: [facility] REFERRING PROVIDER: Cheri Kearns, MD   End of Session - 05/19/23 1529     Visit Number 7    Number of Visits 25    Date for SLP Re-Evaluation 07/18/23    Authorization Type [employer]    Authorization Time Period 04/30/2023 thru 07/18/2023    Authorization - Visit Number 7    Authorization - Number of Visits 24    Progress Note Due on Visit 10    SLP Start Time 1100    SLP Stop Time  1145    SLP Time Calculation (min) 45 min    Activity Tolerance Patient tolerated treatment well             Past Medical History:  Diagnosis Date   Anxiety    Asthma    CHF (congestive heart failure) (HCC)    Depression    Exposure to hepatitis B    Exposure to hepatitis C    Hepatitis C    History of kidney stones    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, ending around 2017   Hypertension    IV drug abuse (HCC)    Myocardial infarction (HCC)    Renal disorder    kidney stones   Stroke [facility])    Past Surgical History:  Procedure Laterality Date   APPENDECTOMY     EYE SURGERY     secondary to dog bite   I & D EXTREMITY Right 01/31/2023   Procedure: IRRIGATION AND DEBRIDEMENT INDEX FINGER;  Surgeon: Betha Loa, MD;  Location: [facility] OR;  Service: Orthopedics;  Laterality: Right;   TIBIA FRACTURE SURGERY Right    Patient Active Problem List   Diagnosis Date Noted   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/01/2023   Abscess of finger 01/31/2023   Hypokalemia 01/31/2023   Paranoid delusion (HCC) 01/31/2023   History of hepatitis C 01/31/2023   Toxic encephalopathy 01/10/2023   MDD (major depressive disorder), recurrent episode, severe (HCC) 12/22/2021   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) 12/18/2021    Left wrist pain 08/27/2021   Bronchospasm 02/18/2021   COVID-19 vaccine dose declined 12/20/2020   Hepatitis C test positive 12/20/2020   Prolactin increased 12/20/2020   Advance care planning 12/20/2020   GAD (generalized anxiety disorder) 04/04/2020   Attention deficit hyperactivity disorder, combined type 04/04/2020   Opioid type dependence, continuous (HCC) 04/04/2020   Major depressive disorder 07/14/2019   Alcohol abuse 07/14/2019   Cocaine abuse (HCC) 07/12/2019   Low back pain 11/28/2017   NICM (nonischemic cardiomyopathy) (HCC) 11/28/2017   Renal stone 11/28/2017   History of seizure 03/14/2017   IVDU (intravenous drug user) 03/14/2017   Other specified abnormal immunological findings in serum 12/21/2015    ONSET DATE: 01/10/2023; date of referral 04/24/2023   REFERRING DIAG: G92.9 (ICD-10-CM) - Unspecified toxic encephalopathy   THERAPY DIAG:  Cognitive communication deficit  Unspecified toxic encephalopathy  Impaired cognition  Rationale for Evaluation and Treatment Rehabilitation  SUBJECTIVE:   PERTINENT HISTORY: Pt is a right handed 42 year old male with past medical history of polysubstance abuse, Hep C, IVDA, depression, ETOH, substance induced mood disorder who has history of hypoxic respiratory failure  and toxic encephalopathy cause by multiple drug overdoses. After discharging from [facility] on  02/02/2023 he was hospitalized in [place] and in a coma for multiple days (per his wife's report).  DIAGNOSTIC FINDINGS:  Head CT 01/10/2023 1.  No evidence of an acute intracranial abnormality. 2. Chronic right basal ganglia lacunar infarct.  PAIN:  Are you having pain? No   FALLS: Has patient fallen in last 6 months?  No  LIVING ENVIRONMENT: Lives with: lives with their family Lives in: House/apartment  PLOF:  Level of assistance: Needed assistance with ADLs, Needed assistance with IADLS Employment: Other: not employed at this time   PATIENT  GOALS   to get his ability to perform tasks back  SUBJECTIVE STATEMENT: Pt brought in his homework, majority completed Pt accompanied by: alone  OBJECTIVE:   TODAY'S TREATMENT:  Skilled treatment session focused on pt's cognitive communication goals. SLP facilitated session by providing the following interventions:  Pt arrived to session with majority of homework completed. Pt indicated that he would enjoy completing remainder of the word search and a sequencing task (WALC 5, p. 75, 19-24)  Moderate faded to rare Min A for scanning row by row Moderate faded to rare Min A for impulsive quickness Initial cueing to read items aloud and double check errors Moderate A for selective A to task in a mildly distracting environment - pt easily distracted by own thoughts and environmental stimuli  PATIENT EDUCATION: Education details: see above Person educated: Patient and Spouse Education method: Explanation Education comprehension: needs further education   HOME EXERCISE PROGRAM:   Complete additional wordsearch   GOALS:  Goals reviewed with patient? Yes  SHORT TERM GOALS: Target date: 10 sessions  Pt will complete cognitive communication assessment for determination of ST goals and POC.  Baseline: Goal status: INITIAL; MET  2.   With Rare Min A, patient will use external aid for functional recall of completed/upcoming activities 80% accuracy.     Baseline:  Goal status: INITIAL  3.  Pt will complete HEP in 5 out of 7 opportunities.  Baseline:  Goal status: INITIAL  4.  With minimal assistance, pt with maintain selective attention to semi-complex problem solving tasks for 15 minutes.  Baseline:  Goal status: INITIAL    LONG TERM GOALS: Target date: 07/18/2023  With Moderate A, patient will use strategies to improve memory for important information with 90% acc.(ie., white board, daily planner/calendar, Apps on phone).   Baseline:  Goal status: INITIAL  2.  With Rare  Min A, patient will a) recall and b) demonstrate use of at least 3 attention strategies (I.e. environmental modifications, self monitoring, self coaching, frequent breaks).   Baseline:  Goal status: INITIAL  3.  Pt will report improved cognitive communication via PROM by 5 points at last ST session    Baseline:  Goal status: INITIAL     ASSESSMENT:  CLINICAL IMPRESSION: Patient is a 42 y.o. old right handed male who was seen today for a cognitive communication therapy session d/t presumed anoxic brain injury. Pt presents with behaviors consistent with Rancho Level VI (Confused, appropriate: Moderate Assistance). Pt continues to be very eager, responds well to therapy activities. See the above treatment note for details.   OBJECTIVE IMPAIRMENTS include attention, memory, awareness, and executive functioning. These impairments are limiting patient from return to work, managing medications, managing appointments, managing finances, household responsibilities, ADLs/IADLs, and effectively communicating at home and in community. Factors affecting potential to achieve goals and functional outcome are ability to learn/carryover information, co-morbidities, medical prognosis, previous level of function, severity of impairments, and  family/community support. Patient will benefit from skilled SLP services to address above impairments and improve overall function.  REHAB POTENTIAL: Good  PLAN: SLP FREQUENCY: 1-2x/week  SLP DURATION: 12 weeks  PLANNED INTERVENTIONS: Environmental controls, Cueing hierachy, Cognitive reorganization, Internal/external aids, Functional tasks, SLP instruction and feedback, Compensatory strategies, and Patient/family education   B. Dreama Saa, M.S., CCC-SLP, Tree surgeon Certified Brain Injury Specialist [facility]  [facility] Rehabilitation Services Office [phone] Ascom [phone] Fax [phone]

## 2023-05-19 NOTE — Therapy (Signed)
OUTPATIENT PHYSICAL THERAPY NEURO TREATMENT   Patient Name: Randall Parsons MRN: [id number] DOB:December 18, 1980, 42 y.o., male Today's Date: 05/19/2023   PCP: Alease Medina, MD  REFERRING PROVIDER: Alease Medina, MD   END OF SESSION:  PT End of Session - 05/19/23 0939     Visit Number 7    Number of Visits 24    Date for PT Re-Evaluation 07/14/23    Progress Note Due on Visit 10    PT Start Time 0930    PT Stop Time 1014    PT Time Calculation (min) 44 min    Equipment Utilized During Treatment Gait belt    Activity Tolerance Patient tolerated treatment well    Behavior During Therapy WFL for tasks assessed/performed               Past Medical History:  Diagnosis Date   Anxiety    Asthma    CHF (congestive heart failure) (HCC)    Depression    Exposure to hepatitis B    Exposure to hepatitis C    Hepatitis C    History of kidney stones    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, ending around 2017   Hypertension    IV drug abuse (HCC)    Myocardial infarction (HCC)    Renal disorder    kidney stones   Stroke [facility])    Past Surgical History:  Procedure Laterality Date   APPENDECTOMY     EYE SURGERY     secondary to dog bite   I & D EXTREMITY Right 01/31/2023   Procedure: IRRIGATION AND DEBRIDEMENT INDEX FINGER;  Surgeon: Betha Loa, MD;  Location: [facility] OR;  Service: Orthopedics;  Laterality: Right;   TIBIA FRACTURE SURGERY Right    Patient Active Problem List   Diagnosis Date Noted   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/01/2023   Abscess of finger 01/31/2023   Hypokalemia 01/31/2023   Paranoid delusion (HCC) 01/31/2023   History of hepatitis C 01/31/2023   Toxic encephalopathy 01/10/2023   MDD (major depressive disorder), recurrent episode, severe (HCC) 12/22/2021   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) 12/18/2021   Left wrist pain 08/27/2021   Bronchospasm 02/18/2021    COVID-19 vaccine dose declined 12/20/2020   Hepatitis C test positive 12/20/2020   Prolactin increased 12/20/2020   Advance care planning 12/20/2020   GAD (generalized anxiety disorder) 04/04/2020   Attention deficit hyperactivity disorder, combined type 04/04/2020   Opioid type dependence, continuous (HCC) 04/04/2020   Major depressive disorder 07/14/2019   Alcohol abuse 07/14/2019   Cocaine abuse (HCC) 07/12/2019   Low back pain 11/28/2017   NICM (nonischemic cardiomyopathy) (HCC) 11/28/2017   Renal stone 11/28/2017   History of seizure 03/14/2017   IVDU (intravenous drug user) 03/14/2017   Other specified abnormal immunological findings in serum 12/21/2015    ONSET DATE: 01/10/2023  REFERRING DIAG: G92.9 (ICD-10-CM) - Unspecified toxic encephalopathy   THERAPY DIAG:  Other lack of coordination  Muscle weakness (generalized)  Balance disorder  Unsteadiness on feet  Difficulty in walking, not elsewhere classified  Weakness generalized  Rationale for Evaluation and Treatment: Rehabilitation  SUBJECTIVE:  SUBJECTIVE STATEMENT: Patient reports his back is really bothering him today- rates 8/10.     Pt accompanied by: significant other "[name]"   PERTINENT HISTORY: Pt admitted to hospital after He was found at a hotel on 4/5 by staff unresponsive, apneic with pinpoint pupils. He was given Narcan in the field, after this became combative and staff was unable to redirect him. On arrival to the ER he remained combative, his jaw was clenched, he was hypoxic, and ultimately intubated for airway protection. Admitted with Acute hypoxic respiratory failure secondary to aspiration pneumonia, pulmonary edema, NSTEMI, in the setting of acute toxic encephalopathy caused by recreational drug overdose,  history of IVDA in the past. At hospital d/c on 4/13 on room air, will be discharged on rescue inhaler 3 more days of oral Levaquin with outpatient PCP follow-up.  Pt has has 2 subsequent ED visits for finger Abscess 01/2023 and Peg tub removal on 6/26 due to pain at peg site. From ED visit "PEG tube removed without difficulty. Dressings given. Outpatient follow-up with GI and hospice. Discharge home."  Per PT referral, pt has continued to have BLE with intermittent difficulty walking as well as getting up from toilet.   PAIN:  Are you having pain? Yes: NPRS scale: 5/10 Pain location: Neck  Pain description: stiffness  Aggravating factors: sleeping  Relieving factors: Chiropractor   Pain location #2 Yes: NPRS scale: 8/10 Pain location: Low bak Pain description: stiffness  Aggravating factors: sleeping  Relieving factors: ICE  PRECAUTIONS: Fall  RED FLAGS: None   WEIGHT BEARING RESTRICTIONS: No  FALLS: Has patient fallen in last 6 months? No   PATIENT GOALS: get back to "normal" get back to work if able. Improve balance    OBJECTIVE:    TODAY'S TREATMENT:                                                                                                                              DATE: 05/19/23   Unless otherwise specified, gait belt and CGA applied for all exercises to ensure patient safety.  NMR:  Step up with high knee march- x 20 reps each LE Tandem gait in // bars (walking on 2x4 board)  down and back x 8 Step over/around 4 cones- focusing on SLS and opp LE hip rotation- Difficulty with coordination of activity.     TE  Nustep- L3 x 5 min (LE only) - Moist heat to low back while in seated position and moist heat left on back of chair during rest breaks from LE strengthening  Sit to stand 2 sets of 10 reps without UE support- VC for technique Side step- squat in // bars x 12 reps each LE (VC for coordination)  Lunge squat walk- Alt LE x  length of // bars and back x  3 Resisted side stepping in // bars with BTB - down and back x 4.       PATIENT EDUCATION: Education  details: POC, Clinic Orientation. PT evaluation  Person educated: Patient and Spouse Education method: Explanation and Demonstration Education comprehension: verbalized understanding  HOME EXERCISE PROGRAM:  Access Code: [email or web address] URL: [email or web address] Date: 04/30/2023 Prepared by: Maureen Ralphs  Exercises - Sit to Stand with Arms Crossed  - 1 x daily - 3 x weekly - 3 sets - 10 reps - Forward Step Up with Counter Support  - 1 x daily - 3 x weekly - 3 sets - 10 reps - Side Stepping with Resistance at Feet  - 1 x daily - 3 x weekly - 3 sets - 10 reps   GOALS: Goals reviewed with patient? Yes   SHORT TERM GOALS: Target date: 05/27/2023    Patient will be independent in home exercise program to improve strength/mobility for better functional independence with ADLs. Baseline: to be given at next session  Goal status: INITIAL   LONG TERM GOALS: Target date: 07/14/2023    Patient will increase FOTO score to equal to or greater than 65    to demonstrate statistically significant improvement in mobility and quality of life.  Baseline: 61 Goal status: INITIAL  2. Pt will increase 6 min walk test by >244ft to demonstrate reduced fall risk and improved access to community.   Baseline: to be completed at next visit  Goal status : Initial    3.  Patient will increase Berg Balance score by > 6 points to demonstrate decreased fall risk during functional activities Baseline: 49 Goal status: INITIAL  4.  Patient will increase 10 meter walk test to >1.34m/s as to improve gait speed for better community ambulation and to reduce fall risk. Baseline: 0.9m/s Goal status: INITIAL  5.  Patient will reduce timed up and go to <11 seconds to reduce fall risk and demonstrate improved transfer/gait ability. Baseline: 12sec Goal status: INITIAL  6.  Patient will  increase dynamic gait index score to >19/24 as to demonstrate reduced fall risk and improved dynamic gait balance for better safety with community/home ambulation.   Baseline: 18 Goal status: INITIAL   ASSESSMENT:  CLINICAL IMPRESSION: Patient presented with good motivation for today's session. He performed well with focus on LE strengthening - able to perform all therex/NMR without complaint of increased back pain. He was fatigued yet able to complete all activities today. Pt will benefit from skilled PT to address strength, balance and coordination deficits to improve overall QoL and allow return to PLOF.    OBJECTIVE IMPAIRMENTS: Abnormal gait, cardiopulmonary status limiting activity, decreased balance, decreased cognition, decreased coordination, decreased endurance, decreased knowledge of condition, decreased knowledge of use of DME, decreased mobility, decreased strength, decreased safety awareness, impaired vision/preception, improper body mechanics, and postural dysfunction.   ACTIVITY LIMITATIONS: carrying, lifting, standing, squatting, stairs, and locomotion level  PARTICIPATION LIMITATIONS: cleaning, laundry, medication management, personal finances, driving, shopping, community activity, occupation, and yard work  PERSONAL FACTORS: Age, Behavior pattern, Education, and Fitness are also affecting patient's functional outcome.   REHAB POTENTIAL: Excellent  CLINICAL DECISION MAKING: Stable/uncomplicated  EVALUATION COMPLEXITY: Moderate  PLAN:  PT FREQUENCY: 1-2x/week  PT DURATION: 12 weeks  PLANNED INTERVENTIONS: Therapeutic exercises, Therapeutic activity, Neuromuscular re-education, Balance training, Gait training, Patient/Family education, Self Care, Joint mobilization, Stair training, Vestibular training, DME instructions, Cognitive remediation, Electrical stimulation, Wheelchair mobility training, Cryotherapy, Moist heat, scar mobilization, Splintting, Taping,  Biofeedback, Manual therapy, and Re-evaluation  PLAN FOR NEXT SESSION:  Conitnued to promote return to gym    Lenda Kelp, PT 05/19/2023, 2:32 PM  2:32 PM, 05/19/23 Physical Therapist - [facility]  Outpatient Physical Therapy- Main Campus [phone]

## 2023-05-19 NOTE — Therapy (Addendum)
OUTPATIENT OCCUPATIONAL THERAPY NEURO TREATMENT NOTE  Patient Name: Randall Parsons MRN: [id number] DOB:December 25, 1980, 42 y.o., male Today's Date: 05/19/2023  PCP: Dr. Cheri Kearns REFERRING PROVIDER: Hillery Aldo  END OF SESSION:  OT End of Session - 05/19/23 1008     Visit Number 7    Number of Visits 24    Date for OT Re-Evaluation 07/14/23    OT Start Time 1015    OT Stop Time 1100    OT Time Calculation (min) 45 min    Activity Tolerance Patient tolerated treatment well    Behavior During Therapy WFL for tasks assessed/performed            Past Medical History:  Diagnosis Date   Anxiety    Asthma    CHF (congestive heart failure) (HCC)    Depression    Exposure to hepatitis B    Exposure to hepatitis C    Hepatitis C    History of kidney stones    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, ending around 2017   Hypertension    IV drug abuse (HCC)    Myocardial infarction (HCC)    Renal disorder    kidney stones   Stroke [facility])    Past Surgical History:  Procedure Laterality Date   APPENDECTOMY     EYE SURGERY     secondary to dog bite   I & D EXTREMITY Right 01/31/2023   Procedure: IRRIGATION AND DEBRIDEMENT INDEX FINGER;  Surgeon: Betha Loa, MD;  Location: [facility] OR;  Service: Orthopedics;  Laterality: Right;   TIBIA FRACTURE SURGERY Right    Patient Active Problem List   Diagnosis Date Noted   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/01/2023   Abscess of finger 01/31/2023   Hypokalemia 01/31/2023   Paranoid delusion (HCC) 01/31/2023   History of hepatitis C 01/31/2023   Toxic encephalopathy 01/10/2023   MDD (major depressive disorder), recurrent episode, severe (HCC) 12/22/2021   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) 12/18/2021   Left wrist pain 08/27/2021   Bronchospasm 02/18/2021   COVID-19 vaccine dose declined 12/20/2020   Hepatitis C test positive 12/20/2020   Prolactin  increased 12/20/2020   Advance care planning 12/20/2020   GAD (generalized anxiety disorder) 04/04/2020   Attention deficit hyperactivity disorder, combined type 04/04/2020   Opioid type dependence, continuous (HCC) 04/04/2020   Major depressive disorder 07/14/2019   Alcohol abuse 07/14/2019   Cocaine abuse (HCC) 07/12/2019   Low back pain 11/28/2017   NICM (nonischemic cardiomyopathy) (HCC) 11/28/2017   Renal stone 11/28/2017   History of seizure 03/14/2017   IVDU (intravenous drug user) 03/14/2017   Other specified abnormal immunological findings in serum 12/21/2015    ONSET DATE: 01/11/2023  REFERRING DIAG: Toxic encephalopathy  THERAPY DIAG:  Muscle weakness (generalized)  Other lack of coordination  Cognitive communication deficit  Rationale for Evaluation and Treatment: Rehabilitation  SUBJECTIVE:   SUBJECTIVE STATEMENT: Pt reports 9/10 back muscle spasms.   Pt accompanied by: significant other  PERTINENT HISTORY: Pt. Is a 41 y.o. male who was admitted to the hospital on 01/10/2023 due to being found at a hotel unresponsive and apneic with pinpoint pupil. Pt. Was intubated for airway protection. Pt. Was admitted with acute hypoxic respiratory failure secondary to aspiration pneumonia, pulmonary edema, NSTEMI, and acute toxic encephalopathy cause by recreational drug overdose. Pt. has a history of polysubstance abuse, Hep C, IVDA, depression, ETOH, substance induced mood disorder.  PRECAUTIONS: No drinking with a straw due to swallowing issues  WEIGHT BEARING RESTRICTIONS: No  PAIN:  Are you having pain? 9/10 back pain   FALLS: Has patient fallen in last 6 months? No  LIVING ENVIRONMENT: Lives with: lives with their family Lives in: House/apartment Stairs: Yes: Internal: 15 steps; on left going up however Pt. Reports he does not go upstairs very often because his bedroom is on main level, 1 external step to get into the house with no rails Has following equipment  at home: None  PLOF: Independent  PATIENT GOALS: Completing tasks such as making a bowl of cereal, balance, making the bed, using the phone  OBJECTIVE:   HAND DOMINANCE: Right  ADLs: Eating: Increased time, Has to think through the tasks when completing them now Grooming: Able to use razor safely however requires increased time to think through UB Dressing: Able to put shirt on however sometimes experiences difficulties when putting his arms through the holes LB Dressing: Able to put pants on however sometimes puts pants on backwards and requires him to switch them to proper position.  Toileting: Able to sit down on toilet and clean self however, struggles getting up by your self at times, however wife has not helped in a week with tolieting and reports he has done well. Bathing: Independent, sometimes have difficulties remembering what they washed  Tub Shower transfers: Independent Equipment:  None  IADLs: Shopping: Wife mostly does grocery shopping, Pt. Will sometime goes with spouse Light housekeeping: Difficulty making up the bed Meal Prep: Wife does most of the cooking, pt. Able to prepare light meal Community mobility: Towards end of day Medication management: Not taking any medicine currently Financial management: Wife does Handwriting: 100% legible  MOBILITY STATUS: Independent  POSTURE COMMENTS:  No Significant postural limitations Sitting balance:  WFL  ACTIVITY TOLERANCE: Activity tolerance: WFL  FUNCTIONAL OUTCOME MEASURES: FOTO: 63 TR:63  UPPER EXTREMITY ROM: WFL  UPPER EXTREMITY MMT:     MMT Right eval Left eval  Shoulder flexion 4+/5 4/5  Shoulder abduction 4+/5 4/5  Shoulder adduction    Shoulder extension    Shoulder internal rotation    Shoulder external rotation    Middle trapezius    Lower trapezius    Elbow flexion 5/5 4+/5  Elbow extension 5/5 4+/5  Wrist flexion 5/5 4/5  Wrist extension 5/5 4/5  Wrist ulnar deviation    Wrist radial  deviation    Wrist pronation    Wrist supination    (Blank rows = not tested)  HAND FUNCTION: Grip strength: Right: 50 lbs; Left: 36 lbs, Lateral pinch: Right: 15 lbs, Left: 13 lbs, and 3 point pinch: Right: 15 lbs, Left: 10 lbs  COORDINATION: 9 Hole Peg test: Right: 28 sec; Left: 32 sec  SENSATION:To be assessed further in functional contexts  EDEMA: None  MUSCLE TONE: None  COGNITION: Overall cognitive status: Impaired  VISION: Subjective report: Pt. Reports vision has gotten worse since the brain damage and unable to read his bible sometimes in church. Pt. Was advised to go to eye doctor due to visual changes present. Baseline vision: Wears glasses for reading only Visual history: brain injury  VISION ASSESSMENT: To be further assessed in functional context  Patient has difficulty with following activities due to following visual impairments: Reading his bible at church  PERCEPTION: To be assessed further in functional contexts  PRAXIS: WFL  TODAY'S TREATMENT:  DATE: 05/19/2023  Therapeutic Exercise:   Pt worked on L hand grip strength using the hand grip strengthener to place and remove small pegs from the 100 peg board. Hand grip strengthener was set to 28.9# x 1 trial. MOD cues to place pegs following design pattern #8.  Therapeutic Activity: Pt completed Pill Box Test with a failing score. The Pill Box test assesses a pt's ability to accurately follow common medication bottle instructions to fill a 1 week pill box. It assesses a pt's ability to plan, self-monitor, volition, and executive function. Pt demonstrated deficits in processing instructions and self-monitoring. Pt read instructions "1 tablet by mouth 2 times a day at breakfast and dinner" and placed 2 beads in morning boxes. Reviewed instructions with patient stating understanding and  repeated - pt again placed 2 beads in morning boxes. Required step by step cues.     PATIENT EDUCATION: Education details: compensatory dressing strategies  Person educated: Patient and Spouse Education method: explanation, vc Education comprehension: verbalized understanding  HOME EXERCISE PROGRAM: Theraputty exercises for L hand strengthing with green resistive theraputty  GOALS: Goals reviewed with patient? Yes  SHORT TERM GOALS: Target date: 07/03/2023  Pt. Will improve FOTO score by 2 points to reflect improved perceived function performance in specific ADLS/IADLs.  Baseline: Eval: FOTO: 63 TR: 63 Goal status: INITIAL   LONG TERM GOALS: Target date: 07/14/2023  Pt. Will improve L shoulder strength by 1 mm grade to be able to sustain UE elevation while completing overhead reaching tasks.  Baseline: EVAL: L: Shoulder flexion: 4/5, Shoulder abduction: 4/5 Goal status: INITIAL  2.  Pt will increase L hand grip strength by 5# to be able to securely hold objects, open containers, and jars. Baseline: Eval: Grip strength: Right: 50 lbs; Left: 36 lbs Goal status: INITIAL  3.  Pt. Will independently make up his bed following the proper sequence of  steps needed to complete the task 100% of the time.  Baseline: Eval: Pt. requires increased time, and assist to sequence through the steps of bed making tasks. Goal status: INITIAL  4.  Pt. Will improve L [facility] skills by 2 seconds of speed to independently manipulate small objects during ADL/IADL tasks.  Baseline: Eval: 9 Hole Peg test: Right: 28 sec; Left: 32 sec Goal status: INITIAL  5. Pt. will independently identify, or demonstrate compensatory strategies for donning UE, and LE clothing in the correct direction. Baseline: Eval: Pt. Puts clothing on backwards, inside out, on a different body part, or out of sequence. Goal status: INITIAL  ASSESSMENT:  CLINICAL IMPRESSION:  Pt. tolerated therapeutic exercise well this date, hand  gripper at 28.9# to remove short pegs from 100 peg board, required rest breaks. MOD cues to place pegs following #8 design pattern. Completed pill box test with failing score. Requires step by step instructions to complete multi step instructions. No cues to complete simple 1 step pill bottle instructions. Plan to review theraputty exercises next session. Pt demonstrates difficulty self-monitoring and identifying errors. Pt will continue to benefit from skilled OT services to improve sequencing skills, [facility] skills, and LUE strength to increase independence in ADL/IADL tasks.   PERFORMANCE DEFICITS: in functional skills including ADLs, IADLs, coordination, strength, pain, Fine motor control, Gross motor control, balance, and UE functional use, cognitive skills including sequencing, and psychosocial skills including coping strategies, environmental adaptation, and routines and behaviors.   IMPAIRMENTS: are limiting patient from ADLs, IADLs, and work.   CO-MORBIDITIES: may have co-morbidities  that affects occupational performance.  Patient will benefit from skilled OT to address above impairments and improve overall function.  MODIFICATION OR ASSISTANCE TO COMPLETE EVALUATION: No modification of tasks or assist necessary to complete an evaluation.  OT OCCUPATIONAL PROFILE AND HISTORY: Detailed assessment: Review of records and additional review of physical, cognitive, psychosocial history related to current functional performance.  CLINICAL DECISION MAKING: Moderate - several treatment options, min-mod task modification necessary  REHAB POTENTIAL: Good  EVALUATION COMPLEXITY: Moderate    PLAN:  OT FREQUENCY: 2x/week  OT DURATION: 12 weeks  PLANNED INTERVENTIONS: self care/ADL training, therapeutic exercise, therapeutic activity, neuromuscular re-education, patient/family education, and cognitive remediation/compensation  RECOMMENDED OTHER SERVICES: ST, PT  CONSULTED AND AGREED WITH PLAN OF  CARE: Patient and family member/caregiver  PLAN FOR NEXT SESSION: Initiate treatment   Kathie Dike, M.S. OTR/L  05/19/23, 10:09 AM  ascom [phone]

## 2023-05-20 ENCOUNTER — Encounter: Payer: MEDICAID | Admitting: Occupational Therapy

## 2023-05-20 ENCOUNTER — Ambulatory Visit: Payer: MEDICAID

## 2023-05-20 ENCOUNTER — Other Ambulatory Visit: Payer: Self-pay | Admitting: Nurse Practitioner

## 2023-05-20 NOTE — Telephone Encounter (Signed)
Contacted pt via mychart.

## 2023-05-22 ENCOUNTER — Ambulatory Visit: Payer: MEDICAID

## 2023-05-22 ENCOUNTER — Ambulatory Visit: Payer: MEDICAID | Admitting: Speech Pathology

## 2023-05-22 ENCOUNTER — Ambulatory Visit: Payer: MEDICAID | Admitting: Physical Therapy

## 2023-05-25 ENCOUNTER — Emergency Department
Admission: EM | Admit: 2023-05-25 | Discharge: 2023-05-25 | Disposition: A | Discharge status: Home / Self Care | Payer: MEDICAID | Attending: Emergency Medicine | Admitting: Emergency Medicine

## 2023-05-25 ENCOUNTER — Emergency Department: Payer: MEDICAID

## 2023-05-25 ENCOUNTER — Other Ambulatory Visit: Payer: Self-pay

## 2023-05-25 DIAGNOSIS — R109 Unspecified abdominal pain: Secondary | ICD-10-CM | POA: Insufficient documentation

## 2023-05-25 DIAGNOSIS — R10A0 Flank pain, unspecified side: Secondary | ICD-10-CM

## 2023-05-25 LAB — COMPREHENSIVE METABOLIC PANEL
ALT: 18 U/L (ref 0–44)
AST: 17 U/L (ref 15–41)
Albumin: 3.8 g/dL (ref 3.5–5.0)
Alkaline Phosphatase: 65 U/L (ref 38–126)
Anion gap: 8 (ref 5–15)
BUN: 13 mg/dL (ref 6–20)
CO2: 27 mmol/L (ref 22–32)
Calcium: 9 mg/dL (ref 8.9–10.3)
Chloride: 104 mmol/L (ref 98–111)
Creatinine, Ser: 0.85 mg/dL (ref 0.61–1.24)
GFR, Estimated: >60 mL/min (ref >60)
Glucose, Bld: 107 mg/dL — ABNORMAL HIGH (ref 70–99)
Potassium: 4.1 mmol/L (ref 3.5–5.1)
Sodium: 139 mmol/L (ref 135–145)
Total Bilirubin: 0.6 mg/dL (ref 0.3–1.2)
Total Protein: 6.7 g/dL (ref 6.5–8.1)

## 2023-05-25 LAB — CBC
HCT: 41.3 % (ref 39.0–52.0)
Hemoglobin: 13.2 g/dL (ref 13.0–17.0)
MCH: 29.9 pg (ref 26.0–34.0)
MCHC: 32 g/dL (ref 30.0–36.0)
MCV: 93.4 fL (ref 80.0–100.0)
Platelets: 274 10*3/uL (ref 150–400)
RBC: 4.42 MIL/uL (ref 4.22–5.81)
RDW: 14.6 % (ref 11.5–15.5)
WBC: 5.2 10*3/uL (ref 4.0–10.5)
nRBC: 0 % (ref 0.0–0.2)

## 2023-05-25 LAB — URINALYSIS, ROUTINE W REFLEX MICROSCOPIC
Bacteria, UA: NONE SEEN
Bilirubin Urine: NEGATIVE
Glucose, UA: NEGATIVE mg/dL
Hgb urine dipstick: NEGATIVE
Ketones, ur: NEGATIVE mg/dL
Nitrite: NEGATIVE
Protein, ur: NEGATIVE mg/dL
Specific Gravity, Urine: 1.017 (ref 1.005–1.030)
Squamous Epithelial / HPF: NONE SEEN /HPF (ref 0–5)
pH: 5 (ref 5.0–8.0)

## 2023-05-25 LAB — LIPASE, BLOOD: Lipase: 42 U/L (ref 11–51)

## 2023-05-25 MED ORDER — CYCLOBENZAPRINE HCL 5 MG PO TABS: 5.0000 mg | ORAL_TABLET | Freq: Three times a day (TID) | ORAL | 0 refills | Status: DC | PRN

## 2023-05-25 NOTE — ED Provider Notes (Signed)
[facility] Provider Note    Event Date/Time   First MD Initiated Contact with Patient 05/25/23 2042     (approximate)  History   Chief Complaint: Flank Pain (X 2 days)  HPI  Antwion Haydu is a 42 y.o. male with a past medical history of anxiety, CHF, hypertension, kidney stones, presents to the emergency department for left flank pain.  According to the patient for the past 2 days he has been experiencing pain in his left flank radiating towards the scrotum.  Patient denies any dysuria or hematuria.  No fever.  No nausea vomiting or diarrhea.  Patient states he has had multiple stones before to which this feels similar so he came to the emergency department for evaluation.  Physical Exam   Triage Vital Signs: ED Triage Vitals  Encounter Vitals Group     BP 05/25/23 1916 118/87     Systolic BP Percentile --      Diastolic BP Percentile --      Pulse Rate 05/25/23 1916 78     Resp 05/25/23 1916 16     Temp 05/25/23 1916 98.4 F (36.9 C)     Temp Source 05/25/23 1916 Oral     SpO2 05/25/23 1916 98 %     Weight 05/25/23 1917 175 lb (79.4 kg)     Height 05/25/23 1917 5\' 9"  (1.753 m)     Head Circumference --      Peak Flow --      Pain Score 05/25/23 1916 9     Pain Loc --      Pain Education --      Exclude from Growth Chart --     Most recent vital signs: Vitals:   05/25/23 1916  BP: 118/87  Pulse: 78  Resp: 16  Temp: 98.4 F (36.9 C)  SpO2: 98%    General: Awake, no distress.  CV:  Good peripheral perfusion.  Regular rate and rhythm  Resp:  Normal effort.  Equal breath sounds bilaterally.  Abd:  No distention.  Soft, nontender.  No rebound or guarding.  ED Results / Procedures / Treatments   RADIOLOGY  I have reviewed and interpreted CT images.  I do not see any obvious kidney stone on my evaluation. Radiology is read the CT is bilateral nonobstructing calyceal stones no ureteral stone.   MEDICATIONS ORDERED IN  ED: Medications - No data to display   IMPRESSION / MDM / ASSESSMENT AND PLAN / ED COURSE  I reviewed the triage vital signs and the nursing notes.  Patient's presentation is most consistent with acute presentation with potential threat to life or bodily function.  Patient presents emergency department for left flank pain.  Patient states that is ongoing for 2 days somewhat intermittent.  No vomiting no diarrhea no fever no hematuria or dysuria.  Patient states he is also had muscle spasms to this area as well and he is not sure if this is more muscular or another kidney stone so he came to the emergency department for evaluation.  Overall reassuring physical exam, benign abdomen.  Lab work shows a normal urinalysis, blood work and CT are pending.  Vital signs are reassuring including afebrile.  Will obtain a CT scan to evaluate for possible ureterolithiasis, differential would also include musculoskeletal pain, intestinal pain, constipation.  Patient's workup is overall reassuring CT scan shows bilateral renal stones but no ureteral stones.  Patient has a reassuring CBC normal chemistry including normal LFTs  normal lipase normal urinalysis no red cells white cells or bacteria.  Given the patient's reassuring workup reassuring physical exam and reassuring vitals we will discharge patient with PCP follow-up.  FINAL CLINICAL IMPRESSION(S) / ED DIAGNOSES   Flank pain   Note:  This document was prepared using Dragon voice recognition software and may include unintentional dictation errors.   Minna Antis, MD 05/25/23 2208

## 2023-05-25 NOTE — ED Triage Notes (Signed)
Pt to ed from home via [facility] for kidney stones. Pt has HX of same. Pt is caox4, in no acute distress and ambulatory in triage. Pt denies any burning during urination. Pt denies any fevers or chills.

## 2023-05-27 ENCOUNTER — Ambulatory Visit: Payer: MEDICAID | Admitting: Speech Pathology

## 2023-05-27 ENCOUNTER — Ambulatory Visit: Payer: MEDICAID

## 2023-05-27 ENCOUNTER — Ambulatory Visit: Payer: MEDICAID | Admitting: Occupational Therapy

## 2023-05-27 DIAGNOSIS — M6281 Muscle weakness (generalized): Secondary | ICD-10-CM

## 2023-05-27 DIAGNOSIS — R278 Other lack of coordination: Secondary | ICD-10-CM | POA: Diagnosis not present

## 2023-05-27 DIAGNOSIS — R262 Difficulty in walking, not elsewhere classified: Secondary | ICD-10-CM

## 2023-05-27 DIAGNOSIS — R4189 Other symptoms and signs involving cognitive functions and awareness: Secondary | ICD-10-CM

## 2023-05-27 DIAGNOSIS — R531 Weakness: Secondary | ICD-10-CM

## 2023-05-27 DIAGNOSIS — R41841 Cognitive communication deficit: Secondary | ICD-10-CM

## 2023-05-27 DIAGNOSIS — R2681 Unsteadiness on feet: Secondary | ICD-10-CM

## 2023-05-27 DIAGNOSIS — R2689 Other abnormalities of gait and mobility: Secondary | ICD-10-CM

## 2023-05-27 NOTE — Therapy (Addendum)
OUTPATIENT OCCUPATIONAL THERAPY NEURO TREATMENT NOTE  Patient Name: Randall Parsons MRN: [id number] DOB:03/30/1981, 42 y.o., male Today's Date: 05/27/2023  PCP: Dr. Cheri Kearns REFERRING PROVIDER: Hillery Aldo  END OF SESSION:  OT End of Session - 05/27/23 0940     Visit Number 8   Number of Visits 24    Date for OT Re-Evaluation 07/14/23    OT Start Time 935   OT Stop Time 1015   OT Time Calculation (min) 40 min    Activity Tolerance Patient tolerated treatment well    Behavior During Therapy [facility] for tasks assessed/performed            Past Medical History:  Diagnosis Date   Anxiety    Asthma    CHF (congestive heart failure) (HCC)    Depression    Exposure to hepatitis B    Exposure to hepatitis C    Hepatitis C    History of kidney stones    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, ending around 2017   Hypertension    IV drug abuse (HCC)    Myocardial infarction (HCC)    Renal disorder    kidney stones   Stroke [facility])    Past Surgical History:  Procedure Laterality Date   APPENDECTOMY     EYE SURGERY     secondary to dog bite   I & D EXTREMITY Right 01/31/2023   Procedure: IRRIGATION AND DEBRIDEMENT INDEX FINGER;  Surgeon: Betha Loa, MD;  Location: [facility] OR;  Service: Orthopedics;  Laterality: Right;   TIBIA FRACTURE SURGERY Right    Patient Active Problem List   Diagnosis Date Noted   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/01/2023   Abscess of finger 01/31/2023   Hypokalemia 01/31/2023   Paranoid delusion (HCC) 01/31/2023   History of hepatitis C 01/31/2023   Toxic encephalopathy 01/10/2023   MDD (major depressive disorder), recurrent episode, severe (HCC) 12/22/2021   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) 12/18/2021   Left wrist pain 08/27/2021   Bronchospasm 02/18/2021   COVID-19 vaccine dose declined 12/20/2020   Hepatitis C test positive 12/20/2020   Prolactin  increased 12/20/2020   Advance care planning 12/20/2020   GAD (generalized anxiety disorder) 04/04/2020   Attention deficit hyperactivity disorder, combined type 04/04/2020   Opioid type dependence, continuous (HCC) 04/04/2020   Major depressive disorder 07/14/2019   Alcohol abuse 07/14/2019   Cocaine abuse (HCC) 07/12/2019   Low back pain 11/28/2017   NICM (nonischemic cardiomyopathy) (HCC) 11/28/2017   Renal stone 11/28/2017   History of seizure 03/14/2017   IVDU (intravenous drug user) 03/14/2017   Other specified abnormal immunological findings in serum 12/21/2015    ONSET DATE: 01/11/2023  REFERRING DIAG: Toxic encephalopathy  THERAPY DIAG:  Muscle weakness (generalized)  Cognitive communication deficit  Rationale for Evaluation and Treatment: Rehabilitation  SUBJECTIVE:   SUBJECTIVE STATEMENT: Pt reports  having kidney stones.  Pt accompanied by: significant other  PERTINENT HISTORY: Pt. Is a 42 y.o. male who was admitted to the hospital on 01/10/2023 due to being found at a hotel unresponsive and apneic with pinpoint pupil. Pt. Was intubated for airway protection. Pt. Was admitted with acute hypoxic respiratory failure secondary to aspiration pneumonia, pulmonary edema, NSTEMI, and acute toxic encephalopathy cause by recreational drug overdose. Pt. has a history of polysubstance abuse, Hep C, IVDA, depression, ETOH, substance induced mood disorder.   PRECAUTIONS: No drinking with a straw due  to swallowing issues  WEIGHT BEARING RESTRICTIONS: No  PAIN:  Are you having pain? 9/10 back pain   FALLS: Has patient fallen in last 6 months? No  LIVING ENVIRONMENT: Lives with: lives with their family Lives in: House/apartment Stairs: Yes: Internal: 15 steps; on left going up however Pt. Reports he does not go upstairs very often because his bedroom is on main level, 1 external step to get into the house with no rails Has following equipment at home: None  PLOF:  Independent  PATIENT GOALS: Completing tasks such as making a bowl of cereal, balance, making the bed, using the phone  OBJECTIVE:   HAND DOMINANCE: Right  ADLs: Eating: Increased time, Has to think through the tasks when completing them now Grooming: Able to use razor safely however requires increased time to think through UB Dressing: Able to put shirt on however sometimes experiences difficulties when putting his arms through the holes LB Dressing: Able to put pants on however sometimes puts pants on backwards and requires him to switch them to proper position.  Toileting: Able to sit down on toilet and clean self however, struggles getting up by your self at times, however wife has not helped in a week with tolieting and reports he has done well. Bathing: Independent, sometimes have difficulties remembering what they washed  Tub Shower transfers: Independent Equipment:  None  IADLs: Shopping: Wife mostly does grocery shopping, Pt. Will sometime goes with spouse Light housekeeping: Difficulty making up the bed Meal Prep: Wife does most of the cooking, pt. Able to prepare light meal Community mobility: Towards end of day Medication management: Not taking any medicine currently Financial management: Wife does Handwriting: 100% legible  MOBILITY STATUS: Independent  POSTURE COMMENTS:  No Significant postural limitations Sitting balance:  WFL  ACTIVITY TOLERANCE: Activity tolerance: WFL  FUNCTIONAL OUTCOME MEASURES: FOTO: 63 TR:63  UPPER EXTREMITY ROM: WFL  UPPER EXTREMITY MMT:     MMT Right eval Left eval  Shoulder flexion 4+/5 4/5  Shoulder abduction 4+/5 4/5  Shoulder adduction    Shoulder extension    Shoulder internal rotation    Shoulder external rotation    Middle trapezius    Lower trapezius    Elbow flexion 5/5 4+/5  Elbow extension 5/5 4+/5  Wrist flexion 5/5 4/5  Wrist extension 5/5 4/5  Wrist ulnar deviation    Wrist radial deviation    Wrist  pronation    Wrist supination    (Blank rows = not tested)  HAND FUNCTION: Grip strength: Right: 50 lbs; Left: 36 lbs, Lateral pinch: Right: 15 lbs, Left: 13 lbs, and 3 point pinch: Right: 15 lbs, Left: 10 lbs  COORDINATION: 9 Hole Peg test: Right: 28 sec; Left: 32 sec  SENSATION:To be assessed further in functional contexts  EDEMA: None  MUSCLE TONE: None  COGNITION: Overall cognitive status: Impaired  VISION: Subjective report: Pt. Reports vision has gotten worse since the brain damage and unable to read his bible sometimes in church. Pt. Was advised to go to eye doctor due to visual changes present. Baseline vision: Wears glasses for reading only Visual history: brain injury  VISION ASSESSMENT: To be further assessed in functional context  Patient has difficulty with following activities due to following visual impairments: Reading his bible at church  PERCEPTION: To be assessed further in functional contexts  PRAXIS: WFL  TODAY'S TREATMENT:  DATE: 05/27/2023  Therapeutic Exercise:   Pt worked on L hand grip strength using the hand grip strengthener  remove small pegs from the 100 peg board. Hand grip strengthener was set to 23.4# x 1 trial. MOD cues to place pegs following design pattern.  Therapeutic Activity:  Pt. worked on problem solving skills placing 1 &1/2" pegs onto a pegboard following jumbo design patterns.    PATIENT EDUCATION: Education details: compensatory dressing strategies  Person educated: Patient and Spouse Education method: explanation, vc Education comprehension: verbalized understanding  HOME EXERCISE PROGRAM: Theraputty exercises for L hand strengthing with green resistive theraputty  GOALS: Goals reviewed with patient? Yes  SHORT TERM GOALS: Target date: 07/03/2023  Pt. Will improve FOTO score by 2 points to  reflect improved perceived function performance in specific ADLS/IADLs.  Baseline: Eval: FOTO: 63 TR: 63 Goal status: INITIAL   LONG TERM GOALS: Target date: 07/14/2023  Pt. Will improve L shoulder strength by 1 mm grade to be able to sustain UE elevation while completing overhead reaching tasks.  Baseline: EVAL: L: Shoulder flexion: 4/5, Shoulder abduction: 4/5 Goal status: INITIAL  2.  Pt will increase L hand grip strength by 5# to be able to securely hold objects, open containers, and jars. Baseline: Eval: Grip strength: Right: 50 lbs; Left: 36 lbs Goal status: INITIAL  3.  Pt. Will independently make up his bed following the proper sequence of  steps needed to complete the task 100% of the time.  Baseline: Eval: Pt. requires increased time, and assist to sequence through the steps of bed making tasks. Goal status: INITIAL  4.  Pt. Will improve L [facility] skills by 2 seconds of speed to independently manipulate small objects during ADL/IADL tasks.  Baseline: Eval: 9 Hole Peg test: Right: 28 sec; Left: 32 sec Goal status: INITIAL  5. Pt. will independently identify, or demonstrate compensatory strategies for donning UE, and LE clothing in the correct direction. Baseline: Eval: Pt. Puts clothing on backwards, inside out, on a different body part, or out of sequence. Goal status: INITIAL  ASSESSMENT:  CLINICAL IMPRESSION:  Pt. tolerated therapeutic exercise well this date using the hand gripper at 23.4# to remove 1 &1/2" pegs from the [employer], required rest breaks. Pt. required decreased cues to place pegs onto the pegboard following the design patterns. Pt. Misplaced 2 pegs, and omitted 4 pegs in one row. Pt demonstrates difficulty self-monitoring and identifying errors. Pt will continue to benefit from skilled OT services to improve sequencing skills, [facility] skills, and LUE strength to increase independence in ADL/IADL tasks.   PERFORMANCE DEFICITS: in functional  skills including ADLs, IADLs, coordination, strength, pain, Fine motor control, Gross motor control, balance, and UE functional use, cognitive skills including sequencing, and psychosocial skills including coping strategies, environmental adaptation, and routines and behaviors.   IMPAIRMENTS: are limiting patient from ADLs, IADLs, and work.   CO-MORBIDITIES: may have co-morbidities  that affects occupational performance. Patient will benefit from skilled OT to address above impairments and improve overall function.  MODIFICATION OR ASSISTANCE TO COMPLETE EVALUATION: No modification of tasks or assist necessary to complete an evaluation.  OT OCCUPATIONAL PROFILE AND HISTORY: Detailed assessment: Review of records and additional review of physical, cognitive, psychosocial history related to current functional performance.  CLINICAL DECISION MAKING: Moderate - several treatment options, min-mod task modification necessary  REHAB POTENTIAL: Good  EVALUATION COMPLEXITY: Moderate    PLAN:  OT FREQUENCY: 2x/week  OT DURATION: 12 weeks  PLANNED INTERVENTIONS: self  care/ADL training, therapeutic exercise, therapeutic activity, neuromuscular re-education, patient/family education, and cognitive remediation/compensation  RECOMMENDED OTHER SERVICES: ST, PT  CONSULTED AND AGREED WITH PLAN OF CARE: Patient and family member/caregiver  PLAN FOR NEXT SESSION: Initiate treatment  Olegario Messier, MS, OTR/L  05/27/23, 9:41 PM

## 2023-05-27 NOTE — Therapy (Signed)
OUTPATIENT SPEECH LANGUAGE PATHOLOGY  COGNITION TREATMENT   Patient Name: Randall Parsons MRN: [id number] DOB:17-May-1981, 42 y.o., male Today's Date: 05/27/2023  PCP: [facility] REFERRING PROVIDER: Cheri Kearns, MD   End of Session - 05/27/23 1104     Visit Number 8    Date for SLP Re-Evaluation 07/18/23    Authorization Type [employer]    Authorization Time Period 04/30/2023 thru 07/18/2023    Authorization - Visit Number 8    Authorization - Number of Visits 24    Progress Note Due on Visit 10    SLP Start Time 1100    SLP Stop Time  1145    SLP Time Calculation (min) 45 min    Activity Tolerance Patient tolerated treatment well             Past Medical History:  Diagnosis Date   Anxiety    Asthma    CHF (congestive heart failure) (HCC)    Depression    Exposure to hepatitis B    Exposure to hepatitis C    Hepatitis C    History of kidney stones    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, ending around 2017   Hypertension    IV drug abuse (HCC)    Myocardial infarction (HCC)    Renal disorder    kidney stones   Stroke [facility])    Past Surgical History:  Procedure Laterality Date   APPENDECTOMY     EYE SURGERY     secondary to dog bite   I & D EXTREMITY Right 01/31/2023   Procedure: IRRIGATION AND DEBRIDEMENT INDEX FINGER;  Surgeon: Betha Loa, MD;  Location: [facility] OR;  Service: Orthopedics;  Laterality: Right;   TIBIA FRACTURE SURGERY Right    Patient Active Problem List   Diagnosis Date Noted   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/01/2023   Abscess of finger 01/31/2023   Hypokalemia 01/31/2023   Paranoid delusion (HCC) 01/31/2023   History of hepatitis C 01/31/2023   Toxic encephalopathy 01/10/2023   MDD (major depressive disorder), recurrent episode, severe (HCC) 12/22/2021   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) 12/18/2021   Left wrist pain  08/27/2021   Bronchospasm 02/18/2021   COVID-19 vaccine dose declined 12/20/2020   Hepatitis C test positive 12/20/2020   Prolactin increased 12/20/2020   Advance care planning 12/20/2020   GAD (generalized anxiety disorder) 04/04/2020   Attention deficit hyperactivity disorder, combined type 04/04/2020   Opioid type dependence, continuous (HCC) 04/04/2020   Major depressive disorder 07/14/2019   Alcohol abuse 07/14/2019   Cocaine abuse (HCC) 07/12/2019   Low back pain 11/28/2017   NICM (nonischemic cardiomyopathy) (HCC) 11/28/2017   Renal stone 11/28/2017   History of seizure 03/14/2017   IVDU (intravenous drug user) 03/14/2017   Other specified abnormal immunological findings in serum 12/21/2015    ONSET DATE: 01/10/2023; date of referral 04/24/2023   REFERRING DIAG: G92.9 (ICD-10-CM) - Unspecified toxic encephalopathy   THERAPY DIAG:  Cognitive communication deficit  Impaired cognition  Rationale for Evaluation and Treatment Rehabilitation  SUBJECTIVE:   PERTINENT HISTORY: Pt is a right handed 42 year old male with past medical history of polysubstance abuse, Hep C, IVDA, depression, ETOH, substance induced mood disorder who has history of hypoxic respiratory failure  and toxic encephalopathy cause by multiple drug overdoses. After discharging from [facility] on 02/02/2023 he was hospitalized in [place] and in a coma for  multiple days (per his wife's report).  DIAGNOSTIC FINDINGS:  Head CT 01/10/2023 1.  No evidence of an acute intracranial abnormality. 2. Chronic right basal ganglia lacunar infarct.  PAIN:  Are you having pain? No   FALLS: Has patient fallen in last 6 months?  No  LIVING ENVIRONMENT: Lives with: lives with their family Lives in: House/apartment  PLOF:  Level of assistance: Needed assistance with ADLs, Needed assistance with IADLS Employment: Other: not employed at this time   PATIENT GOALS   to get his ability to perform tasks  back  SUBJECTIVE STATEMENT: Pt brought in his homework, majority completed Pt accompanied by: alone  OBJECTIVE:   TODAY'S TREATMENT:  Skilled treatment session focused on pt's cognitive communication goals. SLP facilitated session by providing the following interventions:  Pt brought his memory book to session. He had written down information thru week that SLP had created as a template. With maximal assistance, pt able to write down Monday and Tuesday but required total support d/t confusion regarding what day of the week pt was on today. Pt also reports calling [facility] and reports that they no longer take Medicaid. SLP further facilitated support by calling [facility] and confirming that they currently take Medicaid. Information written down for pt in his memory book.   SLP further facilitated session by having pt create template for remaining days of the week. He required moderate cues to refer to his calendar before entering information into his notebook. He continued to benefit from cues for re-orientation to date that he was on in his book   PATIENT EDUCATION: Education details: see above Person educated: Patient and Spouse Education method: Explanation Education comprehension: needs further education   HOME EXERCISE PROGRAM:   Write down information    GOALS:  Goals reviewed with patient? Yes  SHORT TERM GOALS: Target date: 10 sessions  Pt will complete cognitive communication assessment for determination of ST goals and POC.  Baseline: Goal status: INITIAL; MET  2.   With Rare Min A, patient will use external aid for functional recall of completed/upcoming activities 80% accuracy.     Baseline:  Goal status: INITIAL  3.  Pt will complete HEP in 5 out of 7 opportunities.  Baseline:  Goal status: INITIAL  4.  With minimal assistance, pt with maintain selective attention to semi-complex problem solving tasks for 15 minutes.  Baseline:  Goal status: INITIAL    LONG TERM  GOALS: Target date: 07/18/2023  With Moderate A, patient will use strategies to improve memory for important information with 90% acc.(ie., white board, daily planner/calendar, Apps on phone).   Baseline:  Goal status: INITIAL  2.  With Rare Min A, patient will a) recall and b) demonstrate use of at least 3 attention strategies (I.e. environmental modifications, self monitoring, self coaching, frequent breaks).   Baseline:  Goal status: INITIAL  3.  Pt will report improved cognitive communication via PROM by 5 points at last ST session    Baseline:  Goal status: INITIAL     ASSESSMENT:  CLINICAL IMPRESSION: Patient is a 42 y.o. old right handed male who was seen today for a cognitive communication therapy session d/t presumed anoxic brain injury. Pt presents with behaviors consistent with [place] Level VI (Confused, appropriate: Moderate Assistance). Pt continues to be very eager, responds well to therapy activities but continues to be reliant on SLP cues d/t severity of cognitive impairment. See the above treatment note for details.   OBJECTIVE IMPAIRMENTS include attention, memory, awareness,  and executive functioning. These impairments are limiting patient from return to work, managing medications, managing appointments, managing finances, household responsibilities, ADLs/IADLs, and effectively communicating at home and in community. Factors affecting potential to achieve goals and functional outcome are ability to learn/carryover information, co-morbidities, medical prognosis, previous level of function, severity of impairments, and family/community support. Patient will benefit from skilled SLP services to address above impairments and improve overall function.  REHAB POTENTIAL: Good  PLAN: SLP FREQUENCY: 1-2x/week  SLP DURATION: 12 weeks  PLANNED INTERVENTIONS: Environmental controls, Cueing hierachy, Cognitive reorganization, Internal/external aids, Functional tasks, SLP  instruction and feedback, Compensatory strategies, and Patient/family education  Keri Veale B. Dreama Saa, M.S., CCC-SLP, Tree surgeon Certified Brain Injury Specialist [facility]  [facility] Office [phone] Ascom [phone] Fax [phone]

## 2023-05-27 NOTE — Therapy (Signed)
OUTPATIENT PHYSICAL THERAPY NEURO TREATMENT   Patient Name: Randall Parsons MRN: [id number] DOB:06-19-1981, 42 y.o., male Today's Date: 05/27/2023   PCP: Alease Medina, MD  REFERRING PROVIDER: Alease Medina, MD   END OF SESSION:  PT End of Session - 05/27/23 1005     Visit Number 8    Number of Visits 24    Date for PT Re-Evaluation 07/14/23    Progress Note Due on Visit 10    PT Start Time 1015    PT Stop Time 1047    PT Time Calculation (min) 32 min    Equipment Utilized During Treatment Gait belt    Activity Tolerance Patient tolerated treatment well    Behavior During Therapy WFL for tasks assessed/performed               Past Medical History:  Diagnosis Date   Anxiety    Asthma    CHF (congestive heart failure) (HCC)    Depression    Exposure to hepatitis B    Exposure to hepatitis C    Hepatitis C    History of kidney stones    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, ending around 2017   Hypertension    IV drug abuse (HCC)    Myocardial infarction (HCC)    Renal disorder    kidney stones   Stroke [facility])    Past Surgical History:  Procedure Laterality Date   APPENDECTOMY     EYE SURGERY     secondary to dog bite   I & D EXTREMITY Right 01/31/2023   Procedure: IRRIGATION AND DEBRIDEMENT INDEX FINGER;  Surgeon: Betha Loa, MD;  Location: [facility] OR;  Service: Orthopedics;  Laterality: Right;   TIBIA FRACTURE SURGERY Right    Patient Active Problem List   Diagnosis Date Noted   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/01/2023   Abscess of finger 01/31/2023   Hypokalemia 01/31/2023   Paranoid delusion (HCC) 01/31/2023   History of hepatitis C 01/31/2023   Toxic encephalopathy 01/10/2023   MDD (major depressive disorder), recurrent episode, severe (HCC) 12/22/2021   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) 12/18/2021   Left wrist pain 08/27/2021   Bronchospasm 02/18/2021    COVID-19 vaccine dose declined 12/20/2020   Hepatitis C test positive 12/20/2020   Prolactin increased 12/20/2020   Advance care planning 12/20/2020   GAD (generalized anxiety disorder) 04/04/2020   Attention deficit hyperactivity disorder, combined type 04/04/2020   Opioid type dependence, continuous (HCC) 04/04/2020   Major depressive disorder 07/14/2019   Alcohol abuse 07/14/2019   Cocaine abuse (HCC) 07/12/2019   Low back pain 11/28/2017   NICM (nonischemic cardiomyopathy) (HCC) 11/28/2017   Renal stone 11/28/2017   History of seizure 03/14/2017   IVDU (intravenous drug user) 03/14/2017   Other specified abnormal immunological findings in serum 12/21/2015    ONSET DATE: 01/10/2023  REFERRING DIAG: G92.9 (ICD-10-CM) - Unspecified toxic encephalopathy   THERAPY DIAG:  Muscle weakness (generalized)  Other lack of coordination  Balance disorder  Unsteadiness on feet  Difficulty in walking, not elsewhere classified  Weakness generalized  Rationale for Evaluation and Treatment: Rehabilitation  SUBJECTIVE:  SUBJECTIVE STATEMENT: Patient reports he was struggling with Kidney stones- went to ED a couple of days ago. Reports feeling okay so far today.     Pt accompanied by: significant other "Randall Parsons"   PERTINENT HISTORY: Pt admitted to hospital after He was found at a hotel on 4/5 by staff unresponsive, apneic with pinpoint pupils. He was given Narcan in the field, after this became combative and staff was unable to redirect him. On arrival to the ER he remained combative, his jaw was clenched, he was hypoxic, and ultimately intubated for airway protection. Admitted with Acute hypoxic respiratory failure secondary to aspiration pneumonia, pulmonary edema, NSTEMI, in the setting of acute toxic  encephalopathy caused by recreational drug overdose, history of IVDA in the past. At hospital d/c on 4/13 on room air, will be discharged on rescue inhaler 3 more days of oral Levaquin with outpatient PCP follow-up.  Pt has has 2 subsequent ED visits for finger Abscess 01/2023 and Peg tub removal on 6/26 due to pain at peg site. From ED visit "PEG tube removed without difficulty. Dressings given. Outpatient follow-up with GI and hospice. Discharge home."  Per PT referral, pt has continued to have BLE with intermittent difficulty walking as well as getting up from toilet.   PAIN:  Are you having pain? Yes: NPRS scale: 5/10 Pain location: Neck  Pain description: stiffness  Aggravating factors: sleeping  Relieving factors: Chiropractor   Pain location #2 Yes: NPRS scale: 8/10 Pain location: Low bak Pain description: stiffness  Aggravating factors: sleeping  Relieving factors: ICE  PRECAUTIONS: Fall  RED FLAGS: None   WEIGHT BEARING RESTRICTIONS: No  FALLS: Has patient fallen in last 6 months? No   PATIENT GOALS: get back to "normal" get back to work if able. Improve balance    OBJECTIVE:    TODAY'S TREATMENT:                                                                                                                              DATE: 05/27/23   Unless otherwise specified, gait belt and CGA applied for all exercises to ensure patient safety.   TE   Wellzone exercises for LE strengthening:  Knee ext- L2 x 12 reps; L3 x 12 reps; L4 x 12 reps (patient reported as tiring)   Knee flex- L2 x 12 reps; L3 x 12 reps; L4 x 12 reps (I could feel the burn on that one)  Leg Press- L3 x 12 reps (patient reports as easy); L4 x 12; L5 x 12; L6 x 12 reps  Calf press- L3 x 12 reps; L4 x 12 reps; L5 x 12 reps -patient reports - "Man, I am feeling that."       PATIENT EDUCATION: Education details: POC, Clinic Orientation. PT evaluation  Person educated: Patient and  Spouse Education method: Explanation and Demonstration Education comprehension: verbalized understanding  HOME EXERCISE PROGRAM:  Access Code: [email or web address] URL: [email or web address] Date: 04/30/2023 Prepared  by: Maureen Ralphs  Exercises - Sit to Stand with Arms Crossed  - 1 x daily - 3 x weekly - 3 sets - 10 reps - Forward Step Up with Counter Support  - 1 x daily - 3 x weekly - 3 sets - 10 reps - Side Stepping with Resistance at Feet  - 1 x daily - 3 x weekly - 3 sets - 10 reps   GOALS: Goals reviewed with patient? Yes   SHORT TERM GOALS: Target date: 05/27/2023    Patient will be independent in home exercise program to improve strength/mobility for better functional independence with ADLs. Baseline: to be given at next session  Goal status: INITIAL   LONG TERM GOALS: Target date: 07/14/2023    Patient will increase FOTO score to equal to or greater than 65    to demonstrate statistically significant improvement in mobility and quality of life.  Baseline: 61 Goal status: INITIAL  2. Pt will increase 6 min walk test by >250ft to demonstrate reduced fall risk and improved access to community.   Baseline: to be completed at next visit  Goal status : Initial    3.  Patient will increase Berg Balance score by > 6 points to demonstrate decreased fall risk during functional activities Baseline: 49 Goal status: INITIAL  4.  Patient will increase 10 meter walk test to >1.96m/s as to improve gait speed for better community ambulation and to reduce fall risk. Baseline: 0.57m/s Goal status: INITIAL  5.  Patient will reduce timed up and go to <11 seconds to reduce fall risk and demonstrate improved transfer/gait ability. Baseline: 12sec Goal status: INITIAL  6.  Patient will increase dynamic gait index score to >19/24 as to demonstrate reduced fall risk and improved dynamic gait balance for better safety with community/home ambulation.   Baseline: 18 Goal  status: INITIAL   ASSESSMENT:  CLINICAL IMPRESSION: Patient presented with good motivation for today's session. He was introduced to resistive gym based exercises in [place] fitness room today. He responded well to all instruction- able to get on/off equipment safely and utilize weights today without any report of low back pain. Weight was light today to minimize risk of flaring up kidney issues. He reported feeling the weights but that it felt good to be working out again as this was one of his primary goals.  Pt will benefit from skilled PT to address strength, balance and coordination deficits to improve overall QoL and allow return to PLOF.    OBJECTIVE IMPAIRMENTS: Abnormal gait, cardiopulmonary status limiting activity, decreased balance, decreased cognition, decreased coordination, decreased endurance, decreased knowledge of condition, decreased knowledge of use of DME, decreased mobility, decreased strength, decreased safety awareness, impaired vision/preception, improper body mechanics, and postural dysfunction.   ACTIVITY LIMITATIONS: carrying, lifting, standing, squatting, stairs, and locomotion level  PARTICIPATION LIMITATIONS: cleaning, laundry, medication management, personal finances, driving, shopping, community activity, occupation, and yard work  PERSONAL FACTORS: Age, Behavior pattern, Education, and Fitness are also affecting patient's functional outcome.   REHAB POTENTIAL: Excellent  CLINICAL DECISION MAKING: Stable/uncomplicated  EVALUATION COMPLEXITY: Moderate  PLAN:  PT FREQUENCY: 1-2x/week  PT DURATION: 12 weeks  PLANNED INTERVENTIONS: Therapeutic exercises, Therapeutic activity, Neuromuscular re-education, Balance training, Gait training, Patient/Family education, Self Care, Joint mobilization, Stair training, Vestibular training, DME instructions, Cognitive remediation, Electrical stimulation, Wheelchair mobility training, Cryotherapy, Moist heat, scar  mobilization, Splintting, Taping, Biofeedback, Manual therapy, and Re-evaluation  PLAN FOR NEXT SESSION:  Conitnued to promote return to gym  Lenda Kelp, PT 05/27/2023, 10:53 AM  10:53 AM, 05/27/23 Physical Therapist - [facility]  Outpatient Physical Therapy- Main Campus [phone]

## 2023-05-29 ENCOUNTER — Ambulatory Visit: Payer: MEDICAID

## 2023-05-29 ENCOUNTER — Ambulatory Visit: Payer: MEDICAID | Admitting: Speech Pathology

## 2023-05-29 ENCOUNTER — Ambulatory Visit: Payer: MEDICAID | Admitting: Occupational Therapy

## 2023-05-29 DIAGNOSIS — R2689 Other abnormalities of gait and mobility: Secondary | ICD-10-CM

## 2023-05-29 DIAGNOSIS — R278 Other lack of coordination: Secondary | ICD-10-CM | POA: Diagnosis not present

## 2023-05-29 DIAGNOSIS — M6281 Muscle weakness (generalized): Secondary | ICD-10-CM

## 2023-05-29 DIAGNOSIS — R41841 Cognitive communication deficit: Secondary | ICD-10-CM

## 2023-05-29 DIAGNOSIS — R2681 Unsteadiness on feet: Secondary | ICD-10-CM

## 2023-05-29 DIAGNOSIS — G929 Unspecified toxic encephalopathy: Secondary | ICD-10-CM

## 2023-05-29 DIAGNOSIS — R4189 Other symptoms and signs involving cognitive functions and awareness: Secondary | ICD-10-CM

## 2023-05-29 DIAGNOSIS — R262 Difficulty in walking, not elsewhere classified: Secondary | ICD-10-CM

## 2023-05-29 NOTE — Therapy (Signed)
OUTPATIENT PHYSICAL THERAPY NEURO TREATMENT   Patient Name: Randall Parsons MRN: [id number] DOB:Aug 17, 1981, 42 y.o., male Today's Date: 05/29/2023   PCP: Alease Medina, MD  REFERRING PROVIDER: Alease Medina, MD   END OF SESSION:  PT End of Session - 05/29/23 0933     Visit Number 9    Number of Visits 24    Date for PT Re-Evaluation 07/14/23    Progress Note Due on Visit 10    PT Start Time 0930    PT Stop Time 1010    PT Time Calculation (min) 40 min    Equipment Utilized During Treatment Gait belt    Activity Tolerance Patient tolerated treatment well    Behavior During Therapy WFL for tasks assessed/performed               Past Medical History:  Diagnosis Date   Anxiety    Asthma    CHF (congestive heart failure) (HCC)    Depression    Exposure to hepatitis B    Exposure to hepatitis C    Hepatitis C    History of kidney stones    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, ending around 2017   Hypertension    IV drug abuse (HCC)    Myocardial infarction (HCC)    Renal disorder    kidney stones   Stroke [facility])    Past Surgical History:  Procedure Laterality Date   APPENDECTOMY     EYE SURGERY     secondary to dog bite   I & D EXTREMITY Right 01/31/2023   Procedure: IRRIGATION AND DEBRIDEMENT INDEX FINGER;  Surgeon: Betha Loa, MD;  Location: [facility] OR;  Service: Orthopedics;  Laterality: Right;   TIBIA FRACTURE SURGERY Right    Patient Active Problem List   Diagnosis Date Noted   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/01/2023   Abscess of finger 01/31/2023   Hypokalemia 01/31/2023   Paranoid delusion (HCC) 01/31/2023   History of hepatitis C 01/31/2023   Toxic encephalopathy 01/10/2023   MDD (major depressive disorder), recurrent episode, severe (HCC) 12/22/2021   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) 12/18/2021   Left wrist pain 08/27/2021   Bronchospasm 02/18/2021    COVID-19 vaccine dose declined 12/20/2020   Hepatitis C test positive 12/20/2020   Prolactin increased 12/20/2020   Advance care planning 12/20/2020   GAD (generalized anxiety disorder) 04/04/2020   Attention deficit hyperactivity disorder, combined type 04/04/2020   Opioid type dependence, continuous (HCC) 04/04/2020   Major depressive disorder 07/14/2019   Alcohol abuse 07/14/2019   Cocaine abuse (HCC) 07/12/2019   Low back pain 11/28/2017   NICM (nonischemic cardiomyopathy) (HCC) 11/28/2017   Renal stone 11/28/2017   History of seizure 03/14/2017   IVDU (intravenous drug user) 03/14/2017   Other specified abnormal immunological findings in serum 12/21/2015    ONSET DATE: 01/10/2023  REFERRING DIAG: G92.9 (ICD-10-CM) - Unspecified toxic encephalopathy   THERAPY DIAG:  Muscle weakness (generalized)  Other lack of coordination  Balance disorder  Unsteadiness on feet  Difficulty in walking, not elsewhere classified  Rationale for Evaluation and Treatment: Rehabilitation  SUBJECTIVE:  SUBJECTIVE STATEMENT: Patient reports feeling much better and not too sore from last visit   Pt accompanied by: significant other "[name]"   PERTINENT HISTORY: Pt admitted to hospital after He was found at a hotel on 4/5 by staff unresponsive, apneic with pinpoint pupils. He was given Narcan in the field, after this became combative and staff was unable to redirect him. On arrival to the ER he remained combative, his jaw was clenched, he was hypoxic, and ultimately intubated for airway protection. Admitted with Acute hypoxic respiratory failure secondary to aspiration pneumonia, pulmonary edema, NSTEMI, in the setting of acute toxic encephalopathy caused by recreational drug overdose, history of IVDA in the  past. At hospital d/c on 4/13 on room air, will be discharged on rescue inhaler 3 more days of oral Levaquin with outpatient PCP follow-up.  Pt has has 2 subsequent ED visits for finger Abscess 01/2023 and Peg tub removal on 6/26 due to pain at peg site. From ED visit "PEG tube removed without difficulty. Dressings given. Outpatient follow-up with GI and hospice. Discharge home."  Per PT referral, pt has continued to have BLE with intermittent difficulty walking as well as getting up from toilet.   PAIN:  Are you having pain? Yes: NPRS scale: 5/10 Pain location: Neck  Pain description: stiffness  Aggravating factors: sleeping  Relieving factors: Chiropractor   Pain location #2 Yes: NPRS scale: 8/10 Pain location: Low bak Pain description: stiffness  Aggravating factors: sleeping  Relieving factors: ICE  PRECAUTIONS: Fall  RED FLAGS: None   WEIGHT BEARING RESTRICTIONS: No  FALLS: Has patient fallen in last 6 months? No   PATIENT GOALS: get back to "normal" get back to work if able. Improve balance    OBJECTIVE:    TODAY'S TREATMENT:                                                                                                                              DATE: 05/29/23   Unless otherwise specified, gait belt and CGA applied for all exercises to ensure patient safety.   TE   Reviewed Wellzone exercises for LE strengthening:  Knee ext- L3 x 12 reps; L4 x 10 reps; L5 x 8 reps  L6 x 8 reps; L7 x 6 reps Knee flex- L3 x 12 reps; L4 x 12 reps; L5 x 10 reps; L6 x 8 reps   Leg Press- L4 x 12 reps; L5 x 10;  L6 x 12; L7 x 12 reps  Calf press- L5 x 12 reps; L7 x 10 reps; L9 x 8 reps   Chest press- L4 x 12 reps (neutral grip); L4 x 10 reps (horizontal grip)       PATIENT EDUCATION: Education details: POC, Clinic Orientation. PT evaluation  Person educated: Patient and Spouse Education method: Explanation and Demonstration Education comprehension: verbalized  understanding  HOME EXERCISE PROGRAM:  Access Code: [email or web address] URL: [email or web address] Date: 04/30/2023 Prepared by: Maureen Ralphs  Exercises -  Sit to Stand with Arms Crossed  - 1 x daily - 3 x weekly - 3 sets - 10 reps - Forward Step Up with Counter Support  - 1 x daily - 3 x weekly - 3 sets - 10 reps - Side Stepping with Resistance at Feet  - 1 x daily - 3 x weekly - 3 sets - 10 reps   GOALS: Goals reviewed with patient? Yes   SHORT TERM GOALS: Target date: 05/27/2023    Patient will be independent in home exercise program to improve strength/mobility for better functional independence with ADLs. Baseline: to be given at next session  Goal status: INITIAL   LONG TERM GOALS: Target date: 07/14/2023    Patient will increase FOTO score to equal to or greater than 65    to demonstrate statistically significant improvement in mobility and quality of life.  Baseline: 61 Goal status: INITIAL  2. Pt will increase 6 min walk test by >269ft to demonstrate reduced fall risk and improved access to community.   Baseline: to be completed at next visit  Goal status : Initial    3.  Patient will increase Berg Balance score by > 6 points to demonstrate decreased fall risk during functional activities Baseline: 49 Goal status: INITIAL  4.  Patient will increase 10 meter walk test to >1.52m/s as to improve gait speed for better community ambulation and to reduce fall risk. Baseline: 0.52m/s Goal status: INITIAL  5.  Patient will reduce timed up and go to <11 seconds to reduce fall risk and demonstrate improved transfer/gait ability. Baseline: 12sec Goal status: INITIAL  6.  Patient will increase dynamic gait index score to >19/24 as to demonstrate reduced fall risk and improved dynamic gait balance for better safety with community/home ambulation.   Baseline: 18 Goal status: INITIAL   ASSESSMENT:  CLINICAL IMPRESSION: Treatment again focused on reviewing  wellzone gym equipment exercises including lower body. He performed very well and added pyramid style to focus on hypertrophy. He was able to follow all safety cues and no issues with increasing resistance except fatigue.  Pt will benefit from skilled PT to address strength, balance and coordination deficits to improve overall QoL and allow return to PLOF.    OBJECTIVE IMPAIRMENTS: Abnormal gait, cardiopulmonary status limiting activity, decreased balance, decreased cognition, decreased coordination, decreased endurance, decreased knowledge of condition, decreased knowledge of use of DME, decreased mobility, decreased strength, decreased safety awareness, impaired vision/preception, improper body mechanics, and postural dysfunction.   ACTIVITY LIMITATIONS: carrying, lifting, standing, squatting, stairs, and locomotion level  PARTICIPATION LIMITATIONS: cleaning, laundry, medication management, personal finances, driving, shopping, community activity, occupation, and yard work  PERSONAL FACTORS: Age, Behavior pattern, Education, and Fitness are also affecting patient's functional outcome.   REHAB POTENTIAL: Excellent  CLINICAL DECISION MAKING: Stable/uncomplicated  EVALUATION COMPLEXITY: Moderate  PLAN:  PT FREQUENCY: 1-2x/week  PT DURATION: 12 weeks  PLANNED INTERVENTIONS: Therapeutic exercises, Therapeutic activity, Neuromuscular re-education, Balance training, Gait training, Patient/Family education, Self Care, Joint mobilization, Stair training, Vestibular training, DME instructions, Cognitive remediation, Electrical stimulation, Wheelchair mobility training, Cryotherapy, Moist heat, scar mobilization, Splintting, Taping, Biofeedback, Manual therapy, and Re-evaluation  PLAN FOR NEXT SESSION:  Conitnued to promote return to gym    Lenda Kelp, PT 05/29/2023, 11:20 AM  11:20 AM, 05/29/23 Physical Therapist - [facility]  Outpatient Physical  Therapy- Main Campus [phone]

## 2023-05-29 NOTE — Therapy (Signed)
OUTPATIENT SPEECH LANGUAGE PATHOLOGY  COGNITION TREATMENT   Patient Name: Teresa Cianciola MRN: [id number] DOB:[birth year], 42 y.o., male Today's Date: 05/29/2023  PCP: [facility] REFERRING PROVIDER: Cheri Kearns, MD   End of Session - 05/29/23 0806     Visit Number 9    Number of Visits 25    Date for SLP Re-Evaluation 07/18/23    Authorization Type [employer]    Authorization Time Period 04/30/2023 thru 07/18/2023    Authorization - Visit Number 9    Authorization - Number of Visits 24    Progress Note Due on Visit 10    SLP Start Time 0800    SLP Stop Time  0845    SLP Time Calculation (min) 45 min    Activity Tolerance Patient tolerated treatment well             Past Medical History:  Diagnosis Date   Anxiety    Asthma    CHF (congestive heart failure) (HCC)    Depression    Exposure to hepatitis B    Exposure to hepatitis C    Hepatitis C    History of kidney stones    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, ending around 2017   Hypertension    IV drug abuse (HCC)    Myocardial infarction (HCC)    Renal disorder    kidney stones   Stroke [facility])    Past Surgical History:  Procedure Laterality Date   APPENDECTOMY     EYE SURGERY     secondary to dog bite   I & D EXTREMITY Right 01/31/2023   Procedure: IRRIGATION AND DEBRIDEMENT INDEX FINGER;  Surgeon: Betha Loa, MD;  Location: [facility] OR;  Service: Orthopedics;  Laterality: Right;   TIBIA FRACTURE SURGERY Right    Patient Active Problem List   Diagnosis Date Noted   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/01/2023   Abscess of finger 01/31/2023   Hypokalemia 01/31/2023   Paranoid delusion (HCC) 01/31/2023   History of hepatitis C 01/31/2023   Toxic encephalopathy 01/10/2023   MDD (major depressive disorder), recurrent episode, severe (HCC) 12/22/2021   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) 12/18/2021    Left wrist pain 08/27/2021   Bronchospasm 02/18/2021   COVID-19 vaccine dose declined 12/20/2020   Hepatitis C test positive 12/20/2020   Prolactin increased 12/20/2020   Advance care planning 12/20/2020   GAD (generalized anxiety disorder) 04/04/2020   Attention deficit hyperactivity disorder, combined type 04/04/2020   Opioid type dependence, continuous (HCC) 04/04/2020   Major depressive disorder 07/14/2019   Alcohol abuse 07/14/2019   Cocaine abuse (HCC) 07/12/2019   Low back pain 11/28/2017   NICM (nonischemic cardiomyopathy) (HCC) 11/28/2017   Renal stone 11/28/2017   History of seizure 03/14/2017   IVDU (intravenous drug user) 03/14/2017   Other specified abnormal immunological findings in serum 12/21/2015    ONSET DATE: 01/10/2023; date of referral 04/24/2023   REFERRING DIAG: G92.9 (ICD-10-CM) - Unspecified toxic encephalopathy   THERAPY DIAG:  Cognitive communication deficit  Unspecified toxic encephalopathy  Impaired cognition  Rationale for Evaluation and Treatment Rehabilitation  SUBJECTIVE:   PERTINENT HISTORY: Pt is a right handed 42 year old male with past medical history of polysubstance abuse, Hep C, IVDA, depression, ETOH, substance induced mood disorder who has history of hypoxic respiratory failure  and toxic encephalopathy cause by multiple drug overdoses. After discharging from [facility] on  02/02/2023 he was hospitalized in [place] and in a coma for multiple days (per his wife's report).  DIAGNOSTIC FINDINGS:  Head CT 01/10/2023 1.  No evidence of an acute intracranial abnormality. 2. Chronic right basal ganglia lacunar infarct.  PAIN:  Are you having pain? No   FALLS: Has patient fallen in last 6 months?  No  LIVING ENVIRONMENT: Lives with: lives with their family Lives in: House/apartment  PLOF:  Level of assistance: Needed assistance with ADLs, Needed assistance with IADLS Employment: Other: not employed at this time   PATIENT  GOALS   to get his ability to perform tasks back  SUBJECTIVE STATEMENT: Brought in his memory book, has been completing it Pt accompanied by: alone  OBJECTIVE:   TODAY'S TREATMENT:  Skilled treatment session focused on pt's cognitive communication goals. SLP facilitated session by providing the following interventions:  To target restorative memory SLP utilized Constant Therapy Clinician: Remember pictures in order (N-BACK) - Level 1 - Pt began verbally stating each picture to improve his recall of picture immediately prior to improve his accuracy from 75% to 97%  Pt using his memory book to recall information and has made appt with [facility] for continued mental health support.   Attempted to download some apps targeting cognition but unsuccessful during session    PATIENT EDUCATION: Education details: see above Person educated: Patient and Spouse Education method: Explanation Education comprehension: needs further education   HOME EXERCISE PROGRAM:   Continue using memory book  to write down information   Download memory/concentration games onto his phone      GOALS:  Goals reviewed with patient? Yes  SHORT TERM GOALS: Target date: 10 sessions  Pt will complete cognitive communication assessment for determination of ST goals and POC.  Baseline: Goal status: INITIAL; MET  2.   With Rare Min A, patient will use external aid for functional recall of completed/upcoming activities 80% accuracy.     Baseline:  Goal status: INITIAL  3.  Pt will complete HEP in 5 out of 7 opportunities.  Baseline:  Goal status: INITIAL  4.  With minimal assistance, pt with maintain selective attention to semi-complex problem solving tasks for 15 minutes.  Baseline:  Goal status: INITIAL    LONG TERM GOALS: Target date: 07/18/2023  With Moderate A, patient will use strategies to improve memory for important information with 90% acc.(ie., white board, daily planner/calendar, Apps on  phone).   Baseline:  Goal status: INITIAL  2.  With Rare Min A, patient will a) recall and b) demonstrate use of at least 3 attention strategies (I.e. environmental modifications, self monitoring, self coaching, frequent breaks).   Baseline:  Goal status: INITIAL  3.  Pt will report improved cognitive communication via PROM by 5 points at last ST session    Baseline:  Goal status: INITIAL     ASSESSMENT:  CLINICAL IMPRESSION: Patient is a 42 y.o. old right handed male who was seen today for a cognitive communication therapy session d/t presumed anoxic brain injury. Pt presents with behaviors consistent with Rancho Level VI (Confused, appropriate: Moderate Assistance).Pt performed well with N-Back activities during today's session and he ocntinues ot keep his memory book updated and independently See the above treatment note for details.   OBJECTIVE IMPAIRMENTS include attention, memory, awareness, and executive functioning. These impairments are limiting patient from return to work, managing medications, managing appointments, managing finances, household responsibilities, ADLs/IADLs, and effectively communicating at home and in community. Factors affecting potential to achieve goals  and functional outcome are ability to learn/carryover information, co-morbidities, medical prognosis, previous level of function, severity of impairments, and family/community support. Patient will benefit from skilled SLP services to address above impairments and improve overall function.  REHAB POTENTIAL: Good  PLAN: SLP FREQUENCY: 1-2x/week  SLP DURATION: 12 weeks  PLANNED INTERVENTIONS: Environmental controls, Cueing hierachy, Cognitive reorganization, Internal/external aids, Functional tasks, SLP instruction and feedback, Compensatory strategies, and Patient/family education  Hanson Medeiros B. Dreama Saa, M.S., CCC-SLP, Tree surgeon Certified Brain Injury Specialist [facility]   [facility] Rehabilitation Services Office [phone] Ascom [phone] Fax [phone]

## 2023-05-29 NOTE — Therapy (Signed)
OUTPATIENT OCCUPATIONAL THERAPY NEURO TREATMENT NOTE  Patient Name: Randall Parsons MRN: [id number] DOB:1981/07/04, 42 y.o., male Today's Date: 05/29/2023  PCP: Dr. Cheri Kearns REFERRING PROVIDER: Hillery Aldo  END OF SESSION:   OT End of Session - 05/29/23 0946     Visit Number 9    Number of Visits 24    Date for OT Re-Evaluation 07/14/23    OT Start Time 0845    OT Stop Time 0930    OT Time Calculation (min) 45 min    Activity Tolerance Patient tolerated treatment well    Behavior During Therapy [facility] for tasks assessed/performed                Past Medical History:  Diagnosis Date   Anxiety    Asthma    CHF (congestive heart failure) (HCC)    Depression    Exposure to hepatitis B    Exposure to hepatitis C    Hepatitis C    History of kidney stones    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, ending around 2017   Hypertension    IV drug abuse (HCC)    Myocardial infarction (HCC)    Renal disorder    kidney stones   Stroke [facility])    Past Surgical History:  Procedure Laterality Date   APPENDECTOMY     EYE SURGERY     secondary to dog bite   I & D EXTREMITY Right 01/31/2023   Procedure: IRRIGATION AND DEBRIDEMENT INDEX FINGER;  Surgeon: Betha Loa, MD;  Location: [facility] OR;  Service: Orthopedics;  Laterality: Right;   TIBIA FRACTURE SURGERY Right    Patient Active Problem List   Diagnosis Date Noted   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/01/2023   Abscess of finger 01/31/2023   Hypokalemia 01/31/2023   Paranoid delusion (HCC) 01/31/2023   History of hepatitis C 01/31/2023   Toxic encephalopathy 01/10/2023   MDD (major depressive disorder), recurrent episode, severe (HCC) 12/22/2021   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) 12/18/2021   Left wrist pain 08/27/2021   Bronchospasm 02/18/2021   COVID-19 vaccine dose declined 12/20/2020   Hepatitis C test positive 12/20/2020    Prolactin increased 12/20/2020   Advance care planning 12/20/2020   GAD (generalized anxiety disorder) 04/04/2020   Attention deficit hyperactivity disorder, combined type 04/04/2020   Opioid type dependence, continuous (HCC) 04/04/2020   Major depressive disorder 07/14/2019   Alcohol abuse 07/14/2019   Cocaine abuse (HCC) 07/12/2019   Low back pain 11/28/2017   NICM (nonischemic cardiomyopathy) (HCC) 11/28/2017   Renal stone 11/28/2017   History of seizure 03/14/2017   IVDU (intravenous drug user) 03/14/2017   Other specified abnormal immunological findings in serum 12/21/2015    ONSET DATE: 01/11/2023  REFERRING DIAG: Toxic encephalopathy  THERAPY DIAG:  Muscle weakness (generalized)  Rationale for Evaluation and Treatment: Rehabilitation  SUBJECTIVE:   SUBJECTIVE STATEMENT:  Pt. Reports doing well today  Pt accompanied by: significant other  PERTINENT HISTORY: Pt. Is a 42 y.o. male who was admitted to the hospital on 01/10/2023 due to being found at a hotel unresponsive and apneic with pinpoint pupil. Pt. Was intubated for airway protection. Pt. Was admitted with acute hypoxic respiratory failure secondary to aspiration pneumonia, pulmonary edema, NSTEMI, and acute toxic encephalopathy cause by recreational drug overdose. Pt. has a history of polysubstance abuse, Hep C, IVDA, depression, ETOH, substance induced mood disorder.   PRECAUTIONS: No drinking  with a straw due to swallowing issues  WEIGHT BEARING RESTRICTIONS: No  PAIN:  Are you having pain? No  pain reports  FALLS: Has patient fallen in last 6 months? No  LIVING ENVIRONMENT: Lives with: lives with their family Lives in: House/apartment Stairs: Yes: Internal: 15 steps; on left going up however Pt. Reports he does not go upstairs very often because his bedroom is on main level, 1 external step to get into the house with no rails Has following equipment at home: None  PLOF: Independent  PATIENT GOALS:  Completing tasks such as making a bowl of cereal, balance, making the bed, using the phone  OBJECTIVE:   HAND DOMINANCE: Right  ADLs: Eating: Increased time, Has to think through the tasks when completing them now Grooming: Able to use razor safely however requires increased time to think through UB Dressing: Able to put shirt on however sometimes experiences difficulties when putting his arms through the holes LB Dressing: Able to put pants on however sometimes puts pants on backwards and requires him to switch them to proper position.  Toileting: Able to sit down on toilet and clean self however, struggles getting up by your self at times, however wife has not helped in a week with tolieting and reports he has done well. Bathing: Independent, sometimes have difficulties remembering what they washed  Tub Shower transfers: Independent Equipment:  None  IADLs: Shopping: Wife mostly does grocery shopping, Pt. Will sometime goes with spouse Light housekeeping: Difficulty making up the bed Meal Prep: Wife does most of the cooking, pt. Able to prepare light meal Community mobility: Towards end of day Medication management: Not taking any medicine currently Financial management: Wife does Handwriting: 100% legible  MOBILITY STATUS: Independent  POSTURE COMMENTS:  No Significant postural limitations Sitting balance:  WFL  ACTIVITY TOLERANCE: Activity tolerance: WFL  FUNCTIONAL OUTCOME MEASURES: FOTO: 63 TR:63  UPPER EXTREMITY ROM: WFL  UPPER EXTREMITY MMT:     MMT Right eval Left eval  Shoulder flexion 4+/5 4/5  Shoulder abduction 4+/5 4/5  Shoulder adduction    Shoulder extension    Shoulder internal rotation    Shoulder external rotation    Middle trapezius    Lower trapezius    Elbow flexion 5/5 4+/5  Elbow extension 5/5 4+/5  Wrist flexion 5/5 4/5  Wrist extension 5/5 4/5  Wrist ulnar deviation    Wrist radial deviation    Wrist pronation    Wrist supination     (Blank rows = not tested)  HAND FUNCTION: Grip strength: Right: 50 lbs; Left: 36 lbs, Lateral pinch: Right: 15 lbs, Left: 13 lbs, and 3 point pinch: Right: 15 lbs, Left: 10 lbs  COORDINATION: 9 Hole Peg test: Right: 28 sec; Left: 32 sec  SENSATION:To be assessed further in functional contexts  EDEMA: None  MUSCLE TONE: None  COGNITION: Overall cognitive status: Impaired  VISION: Subjective report: Pt. Reports vision has gotten worse since the brain damage and unable to read his bible sometimes in church. Pt. Was advised to go to eye doctor due to visual changes present. Baseline vision: Wears glasses for reading only Visual history: brain injury  VISION ASSESSMENT: To be further assessed in functional context  Patient has difficulty with following activities due to following visual impairments: Reading his bible at church  PERCEPTION: To be assessed further in functional contexts  PRAXIS: WFL  TODAY'S TREATMENT:  DATE: 05/29/2023  Therapeutic Exercise:    Pt. Worked on the [employer] for 8 min. With constant monitoring of the BUEs. Pt. Worked on changing, and alternating forward reverse position every 2 min. Rest breaks were required.    Therapeutic Activity:  Pt. Worked on sorting cards by suit, with progressively increasing the complexity of the task by adding speed to further challenge the Pt. Pt. Worked on memory tasks using large print cards. Pt. worked on Associate Professor cards by [employer]. Pt. Worked on memory tasks identifying, and locating 5-6 objects from memory. Memory tasks were performed as component skills needed in preparation for completing ADL, and IADL functioning.    PATIENT EDUCATION: Education details: compensatory dressing strategies  Person educated: Patient and Spouse Education method: explanation, vc Education  comprehension: verbalized understanding  HOME EXERCISE PROGRAM: Theraputty exercises for L hand strengthing with green resistive theraputty  GOALS: Goals reviewed with patient? Yes  SHORT TERM GOALS: Target date: 07/03/2023  Pt. Will improve FOTO score by 2 points to reflect improved perceived function performance in specific ADLS/IADLs.  Baseline: Eval: FOTO: 63 TR: 63 Goal status: INITIAL   LONG TERM GOALS: Target date: 07/14/2023  Pt. Will improve L shoulder strength by 1 mm grade to be able to sustain UE elevation while completing overhead reaching tasks.  Baseline: EVAL: L: Shoulder flexion: 4/5, Shoulder abduction: 4/5 Goal status: INITIAL  2.  Pt will increase L hand grip strength by 5# to be able to securely hold objects, open containers, and jars. Baseline: Eval: Grip strength: Right: 50 lbs; Left: 36 lbs Goal status: INITIAL  3.  Pt. Will independently make up his bed following the proper sequence of  steps needed to complete the task 100% of the time.  Baseline: Eval: Pt. requires increased time, and assist to sequence through the steps of bed making tasks. Goal status: INITIAL  4.  Pt. Will improve L [facility] skills by 2 seconds of speed to independently manipulate small objects during ADL/IADL tasks.  Baseline: Eval: 9 Hole Peg test: Right: 28 sec; Left: 32 sec Goal status: INITIAL  5. Pt. will independently identify, or demonstrate compensatory strategies for donning UE, and LE clothing in the correct direction. Baseline: Eval: Pt. Puts clothing on backwards, inside out, on a different body part, or out of sequence. Goal status: INITIAL  ASSESSMENT:  CLINICAL IMPRESSION:  Pt. tolerated the SCIFit on workload 4.0. for 8 min. Pt. was able to sort cards by suit efficiently with a speed component added for additional challenge. Pt. required increased time to match card pair by number, and color form memory with 40% accuracy. Pt. was able to accurately, and consistently  identify, and locate 5/6 objects form memory. Pt. continues to benefit from skilled OT services to improve memory skills,  sequencing skills, [facility] skills, and LUE strength to increase independence in ADL/IADL tasks.   PERFORMANCE DEFICITS: in functional skills including ADLs, IADLs, coordination, strength, pain, Fine motor control, Gross motor control, balance, and UE functional use, cognitive skills including sequencing, and psychosocial skills including coping strategies, environmental adaptation, and routines and behaviors.   IMPAIRMENTS: are limiting patient from ADLs, IADLs, and work.   CO-MORBIDITIES: may have co-morbidities  that affects occupational performance. Patient will benefit from skilled OT to address above impairments and improve overall function.  MODIFICATION OR ASSISTANCE TO COMPLETE EVALUATION: No modification of tasks or assist necessary to complete an evaluation.  OT OCCUPATIONAL PROFILE AND HISTORY: Detailed assessment: Review of records and additional review of  physical, cognitive, psychosocial history related to current functional performance.  CLINICAL DECISION MAKING: Moderate - several treatment options, min-mod task modification necessary  REHAB POTENTIAL: Good  EVALUATION COMPLEXITY: Moderate    PLAN:  OT FREQUENCY: 2x/week  OT DURATION: 12 weeks  PLANNED INTERVENTIONS: self care/ADL training, therapeutic exercise, therapeutic activity, neuromuscular re-education, patient/family education, and cognitive remediation/compensation  RECOMMENDED OTHER SERVICES: ST, PT  CONSULTED AND AGREED WITH PLAN OF CARE: Patient and family member/caregiver  PLAN FOR NEXT SESSION: Initiate treatment  Olegario Messier, MS, OTR/L  05/29/23, 9:57 AM

## 2023-06-03 ENCOUNTER — Ambulatory Visit: Payer: MEDICAID | Admitting: Speech Pathology

## 2023-06-03 ENCOUNTER — Ambulatory Visit: Payer: MEDICAID

## 2023-06-03 ENCOUNTER — Telehealth: Payer: Self-pay | Admitting: Speech Pathology

## 2023-06-03 NOTE — Telephone Encounter (Signed)
Attempted to contact pt as he was a "no show/no call" for ST/OT/PT today. Unable to leave voicemail as his voicemail was not set-up.   Attempted x 2 with no response.   Leylah Tarnow B. Dreama Saa, M.S., CCC-SLP, Tree surgeon Certified Brain Injury Specialist [facility]  [facility] Rehabilitation Services Office [phone] Ascom [phone] Fax [phone]

## 2023-06-05 ENCOUNTER — Ambulatory Visit: Payer: MEDICAID | Admitting: Occupational Therapy

## 2023-06-05 ENCOUNTER — Ambulatory Visit: Payer: MEDICAID

## 2023-06-05 ENCOUNTER — Ambulatory Visit: Payer: MEDICAID | Admitting: Speech Pathology

## 2023-06-05 DIAGNOSIS — G929 Unspecified toxic encephalopathy: Secondary | ICD-10-CM

## 2023-06-05 DIAGNOSIS — R4189 Other symptoms and signs involving cognitive functions and awareness: Secondary | ICD-10-CM

## 2023-06-05 DIAGNOSIS — R278 Other lack of coordination: Secondary | ICD-10-CM | POA: Diagnosis not present

## 2023-06-05 DIAGNOSIS — M6281 Muscle weakness (generalized): Secondary | ICD-10-CM

## 2023-06-05 DIAGNOSIS — R41841 Cognitive communication deficit: Secondary | ICD-10-CM

## 2023-06-05 NOTE — Therapy (Signed)
OUTPATIENT SPEECH LANGUAGE PATHOLOGY  COGNITION TREATMENT 10th VISIT PROGRESS NOTE   Patient Name: Randall Parsons MRN: [id number] DOB:Jun 28, 1981, 42 y.o., male Today's Date: 06/05/2023  PCP: [facility] REFERRING PROVIDER: Cheri Kearns, MD  Speech Therapy Progress Note  Dates of Reporting Period: 04/25/2023 to 06/05/2023  Objective: Patient has been seen for 10 speech therapy sessions this reporting period targeting his mild to moderate cognitive communication impairments. Pt has eagerly attended sessions and as such he has made great progress towards goals by meeting 3 of 4 STGs and 2 of 3 LTGs.  See skilled intervention, clinical impressions, and goals below for details.    End of Session - 06/05/23 0828     Visit Number 10    Number of Visits 25    Date for SLP Re-Evaluation 07/18/23    Authorization Type [employer]    Authorization Time Period 04/30/2023 thru 07/18/2023    Authorization - Visit Number 10    Authorization - Number of Visits 24    Progress Note Due on Visit 10    SLP Start Time 0800    SLP Stop Time  0845    SLP Time Calculation (min) 45 min    Activity Tolerance Patient tolerated treatment well             Past Medical History:  Diagnosis Date   Anxiety    Asthma    CHF (congestive heart failure) (HCC)    Depression    Exposure to hepatitis B    Exposure to hepatitis C    Hepatitis C    History of kidney stones    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, ending around 2017   Hypertension    IV drug abuse (HCC)    Myocardial infarction (HCC)    Renal disorder    kidney stones   Stroke [facility])    Past Surgical History:  Procedure Laterality Date   APPENDECTOMY     EYE SURGERY     secondary to dog bite   I & D EXTREMITY Right 01/31/2023   Procedure: IRRIGATION AND DEBRIDEMENT INDEX FINGER;  Surgeon: Betha Loa, MD;  Location: [facility] OR;  Service: Orthopedics;  Laterality: Right;   TIBIA FRACTURE  SURGERY Right    Patient Active Problem List   Diagnosis Date Noted   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/01/2023   Abscess of finger 01/31/2023   Hypokalemia 01/31/2023   Paranoid delusion (HCC) 01/31/2023   History of hepatitis C 01/31/2023   Toxic encephalopathy 01/10/2023   MDD (major depressive disorder), recurrent episode, severe (HCC) 12/22/2021   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) 12/18/2021   Left wrist pain 08/27/2021   Bronchospasm 02/18/2021   COVID-19 vaccine dose declined 12/20/2020   Hepatitis C test positive 12/20/2020   Prolactin increased 12/20/2020   Advance care planning 12/20/2020   GAD (generalized anxiety disorder) 04/04/2020   Attention deficit hyperactivity disorder, combined type 04/04/2020   Opioid type dependence, continuous (HCC) 04/04/2020   Major depressive disorder 07/14/2019   Alcohol abuse 07/14/2019   Cocaine abuse (HCC) 07/12/2019   Low back pain 11/28/2017   NICM (nonischemic cardiomyopathy) (HCC) 11/28/2017   Renal stone 11/28/2017   History of seizure 03/14/2017   IVDU (intravenous drug user) 03/14/2017   Other specified abnormal immunological findings in serum 12/21/2015    ONSET DATE: 01/10/2023; date of referral 04/24/2023   REFERRING DIAG: G92.9 (ICD-10-CM) - Unspecified  toxic encephalopathy   THERAPY DIAG:  Cognitive communication deficit  Unspecified toxic encephalopathy  Impaired cognition  Rationale for Evaluation and Treatment Rehabilitation  SUBJECTIVE:   PERTINENT HISTORY: Pt is a right handed 42 year old male with past medical history of polysubstance abuse, Hep C, IVDA, depression, ETOH, substance induced mood disorder who has history of hypoxic respiratory failure  and toxic encephalopathy cause by multiple drug overdoses. After discharging from [facility] on 02/02/2023 he was hospitalized in [place] and in a coma for multiple days (per his wife's  report).  DIAGNOSTIC FINDINGS:  Head CT 01/10/2023 1.  No evidence of an acute intracranial abnormality. 2. Chronic right basal ganglia lacunar infarct.  PAIN:  Are you having pain? No   FALLS: Has patient fallen in last 6 months?  No  LIVING ENVIRONMENT: Lives with: lives with their family Lives in: House/apartment  PLOF:  Level of assistance: Needed assistance with ADLs, Needed assistance with IADLS Employment: Other: not employed at this time   PATIENT GOALS   to get his ability to perform tasks back  SUBJECTIVE STATEMENT: Brought in his memory book, has been completing it Pt accompanied by: alone  OBJECTIVE:   TODAY'S TREATMENT:  Skilled treatment session focused on pt's cognitive communication goals. SLP facilitated session by providing the following interventions:  Pt arrives to today's session reporting that he has moved out and is living independently in a rented room and has successfully secured a job. In addition, he has re-arranged his therapy schedule to accommodate.  Pt brought in his memory notebook and stated he was having difficulty because he had gotten confused. SLP assisted pt in organization of the remaining part of week and next week. SLP also provided instruction in folding down completed pages for easy access to the next day.   SLP further facilitated session by using the Constant Therapy Clinician App to target attention and working memory: Remember spoken word order (N-Back) Level 1 - 95%  Activities provided in a moderately distracting environment with pt missing 1 answer in app, to which he stated "I should have shut the door." Pt with much improved awareness of attention strategies.     PATIENT EDUCATION: Education details: see above Person educated: Patient and Spouse Education method: Explanation Education comprehension: needs further education   HOME EXERCISE PROGRAM:   Continue using memory book  to write down information    Download memory/concentration games onto his phone      GOALS:  Goals reviewed with patient? Yes  SHORT TERM GOALS: Target date: 10 sessions  UPDATED 06/05/2023 Pt will complete cognitive communication assessment for determination of ST goals and POC.  Baseline: Goal status: INITIAL; MET  2.   With Rare Min A, patient will use external aid for functional recall of completed/upcoming activities 80% accuracy.     Baseline:  Goal status: INITIAL: MET; UPDATED; Pt will be Mod I for [place] of external memory aid for functional recall of completed/upcoming activities with 90% accuracy.   3.  Pt will complete HEP in 5 out of 7 opportunities.  Baseline:  Goal status: INITIAL: ongoing progress made  4.  With minimal assistance, pt with maintain selective attention to semi-complex problem solving tasks for 15 minutes.  Baseline:  Goal status: INITIAL: MET; UPDATED; With minimal assistance pt will strategies for selective attention to complex problem solving tasks for 20 minutes in a moderately distracting environment.   5. With minimal assistance, pt will complete semi-complex problem solving tasks with >80% accuracy.  Baseline:  Goals Status: INITIAL    LONG TERM GOALS: Target date: 07/18/2023  UPDATED: 06/05/2023 With Moderate A, patient will use strategies to improve memory for important information with 90% acc.(ie., white board, daily planner/calendar, Apps on phone).   Baseline:  Goal status: INITIAL: MET; UPDATED: With Mod I, pt will use strategies to improve information with 90% acc. (I.e., white board, daily planner/calendar, Apps on phone).   2.  With Rare Min A, patient will a) recall and b) demonstrate use of at least 3 attention strategies (I.e. environmental modifications, self monitoring, self coaching, frequent breaks).   Baseline:  Goal status: INITIAL: MET: UPDATED: Pt will be Mod I for a) recall and b) demonstrate use of at least 3 attention strategies (I.e.,  environment modifications, self-monitoring, self coaching, frequent breaks).   3.  Pt will report improved cognitive communication via PROM by 5 points at last ST session    Baseline:  Goal status: INITIAL: progress made     ASSESSMENT:  CLINICAL IMPRESSION: Patient is a 42 y.o. old right handed male who was seen today for a cognitive communication therapy session d/t presumed anoxic brain injury.  Pt with great functional progress which is also conveyed by the achieved goals above. As a result, he now has behaviors consistent with [place] Level VIII (Purposeful and Appropriate). See treatment note for more details of today's session.    OBJECTIVE IMPAIRMENTS include attention, memory, awareness, and executive functioning. These impairments are limiting patient from return to work, managing medications, managing appointments, managing finances, household responsibilities, ADLs/IADLs, and effectively communicating at home and in community. Factors affecting potential to achieve goals and functional outcome are ability to learn/carryover information, co-morbidities, medical prognosis, previous level of function, severity of impairments, and family/community support. Patient will benefit from skilled SLP services to address above impairments and improve overall function.  REHAB POTENTIAL: Good  PLAN: SLP FREQUENCY: 1-2x/week  SLP DURATION: 12 weeks  PLANNED INTERVENTIONS: Environmental controls, Cueing hierachy, Cognitive reorganization, Internal/external aids, Functional tasks, SLP instruction and feedback, Compensatory strategies, and Patient/family education  Nishanth Mccaughan B. Dreama Saa, M.S., CCC-SLP, Tree surgeon Certified Brain Injury Specialist [facility]  [facility] Rehabilitation Services Office [phone] Ascom [phone] Fax [phone]

## 2023-06-05 NOTE — Therapy (Signed)
OUTPATIENT PHYSICAL THERAPY NEURO TREATMENT/Physical Therapy Progress Note   Dates of reporting period  04/21/2023   to   06/05/2023   Patient Name: Randall Parsons MRN: [id number] DOB:[birth year], 42 y.o., male Today's Date: 06/05/2023   PCP: Alease Medina, MD  REFERRING PROVIDER: Alease Medina, MD   END OF SESSION:  PT End of Session - 06/05/23 0941     Visit Number 10    Number of Visits 24    Date for PT Re-Evaluation 07/14/23    Progress Note Due on Visit 10    PT Start Time 0930    PT Stop Time 1010    PT Time Calculation (min) 40 min    Equipment Utilized During Treatment Gait belt    Activity Tolerance Patient tolerated treatment well    Behavior During Therapy WFL for tasks assessed/performed               Past Medical History:  Diagnosis Date   Anxiety    Asthma    CHF (congestive heart failure) (HCC)    Depression    Exposure to hepatitis B    Exposure to hepatitis C    Hepatitis C    History of kidney stones    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, ending around 2017   Hypertension    IV drug abuse (HCC)    Myocardial infarction (HCC)    Renal disorder    kidney stones   Stroke [facility])    Past Surgical History:  Procedure Laterality Date   APPENDECTOMY     EYE SURGERY     secondary to dog bite   I & D EXTREMITY Right 01/31/2023   Procedure: IRRIGATION AND DEBRIDEMENT INDEX FINGER;  Surgeon: Betha Loa, MD;  Location: [facility] OR;  Service: Orthopedics;  Laterality: Right;   TIBIA FRACTURE SURGERY Right    Patient Active Problem List   Diagnosis Date Noted   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/01/2023   Abscess of finger 01/31/2023   Hypokalemia 01/31/2023   Paranoid delusion (HCC) 01/31/2023   History of hepatitis C 01/31/2023   Toxic encephalopathy 01/10/2023   MDD (major depressive disorder), recurrent episode, severe (HCC) 12/22/2021   Cocaine use with  cocaine-induced mood disorder (HCC) 12/18/2021   Left wrist pain 08/27/2021   Bronchospasm 02/18/2021   COVID-19 vaccine dose declined 12/20/2020   Hepatitis C test positive 12/20/2020   Prolactin increased 12/20/2020   Advance care planning 12/20/2020   GAD (generalized anxiety disorder) 04/04/2020   Attention deficit hyperactivity disorder, combined type 04/04/2020   Opioid type dependence, continuous (HCC) 04/04/2020   Major depressive disorder 07/14/2019   Alcohol abuse 07/14/2019   Cocaine abuse (HCC) 07/12/2019   Low back pain 11/28/2017   NICM (nonischemic cardiomyopathy) (HCC) 11/28/2017   Renal stone 11/28/2017   History of seizure 03/14/2017   IVDU (intravenous drug user) 03/14/2017   Other specified abnormal immunological findings in serum 12/21/2015    ONSET DATE: 01/10/2023  REFERRING DIAG: G92.9 (ICD-10-CM) - Unspecified toxic encephalopathy   THERAPY DIAG:  Muscle weakness (generalized)  Other lack of coordination  Rationale for Evaluation and Treatment: Rehabilitation  SUBJECTIVE:  SUBJECTIVE STATEMENT: Patient reports feeling better- walking better and actually going to try to go to work starting next week part time as able.   Pt accompanied by: significant other "Randall Parsons"   PERTINENT HISTORY: Pt admitted to hospital after He was found at a hotel on 4/5 by staff unresponsive, apneic with pinpoint pupils. He was given Narcan in the field, after this became combative and staff was unable to redirect him. On arrival to the ER he remained combative, his jaw was clenched, he was hypoxic, and ultimately intubated for airway protection. Admitted with Acute hypoxic respiratory failure secondary to aspiration pneumonia, pulmonary edema, NSTEMI, in the setting of acute toxic encephalopathy  caused by recreational drug overdose, history of IVDA in the past. At hospital d/c on 4/13 on room air, will be discharged on rescue inhaler 3 more days of oral Levaquin with outpatient PCP follow-up.  Pt has has 2 subsequent ED visits for finger Abscess 01/2023 and Peg tub removal on 6/26 due to pain at peg site. From ED visit "PEG tube removed without difficulty. Dressings given. Outpatient follow-up with GI and hospice. Discharge home."  Per PT referral, pt has continued to have BLE with intermittent difficulty walking as well as getting up from toilet.   PAIN:  Are you having pain? Yes: NPRS scale: 5/10 Pain location: Neck  Pain description: stiffness  Aggravating factors: sleeping  Relieving factors: Chiropractor   Pain location #2 Yes: NPRS scale: 8/10 Pain location: Low bak Pain description: stiffness  Aggravating factors: sleeping  Relieving factors: ICE  PRECAUTIONS: Fall  RED FLAGS: None   WEIGHT BEARING RESTRICTIONS: No  FALLS: Has patient fallen in last 6 months? No   PATIENT GOALS: get back to "normal" get back to work if able. Improve balance    OBJECTIVE:    TODAY'S TREATMENT:                                                                                                                              DATE: 06/05/23   Unless otherwise specified, gait belt and CGA applied for all exercises to ensure patient safety.   TE   Instructed in [employer] exercises for Back strengthening:  Lat pull down - L3 3 sets x 12 reps Rows- wide grip L3 x 12; neutral grip x 12; wide x 12 more reps  *VC for correct use of equipment and technique.      PATIENT EDUCATION: Education details: POC, Clinic Orientation. PT evaluation  Person educated: Patient and Spouse Education method: Explanation and Demonstration Education comprehension: verbalized understanding  HOME EXERCISE PROGRAM:  Access Code: [email or web address] URL: [email or web address] Date:  04/30/2023 Prepared by: Maureen Ralphs  Exercises - Sit to Stand with Arms Crossed  - 1 x daily - 3 x weekly - 3 sets - 10 reps - Forward Step Up with Counter Support  - 1 x daily - 3 x weekly - 3 sets - 10 reps - Side Stepping with  Resistance at Feet  - 1 x daily - 3 x weekly - 3 sets - 10 reps   GOALS: Goals reviewed with patient? Yes   SHORT TERM GOALS: Target date: 05/27/2023    Patient will be independent in home exercise program to improve strength/mobility for better functional independence with ADLs. Baseline: to be given at next session  Goal status: INITIAL   LONG TERM GOALS: Target date: 07/14/2023    Patient will increase FOTO score to equal to or greater than 65    to demonstrate statistically significant improvement in mobility and quality of life.  Baseline: 61 Goal status: INITIAL  2. Pt will increase 6 min walk test by >269ft to demonstrate reduced fall risk and improved access to community.  Baseline: to be completed at next visit; 04/30/2023=890 feet without AD; 06/05/2023= 1410 feet  Goal status : MET   3.  Patient will increase Berg Balance score by > 6 points to demonstrate decreased fall risk during functional activities Baseline: 49;  Goal status: INITIAL  4.  Patient will increase 10 meter walk test to >1.76m/s as to improve gait speed for better community ambulation and to reduce fall risk. Baseline: 0.45m/s; 06/05/2023= 1.2 m/s Goal status: MET  5.  Patient will reduce timed up and go to <11 seconds to reduce fall risk and demonstrate improved transfer/gait ability. Baseline: 12sec; 06/05/2023= 8.49 sec  Goal status: MET  6.  Patient will increase dynamic gait index score to >19/24 as to demonstrate reduced fall risk and improved dynamic gait balance for better safety with community/home ambulation.   Baseline: 18 Goal status: INITIAL   ASSESSMENT:  CLINICAL IMPRESSION: Treatment consisted of continuing to progress gym based exercises and  patient performed some back exercises- requiring some VC and visual demo for correct technique. Also retested some goals for progress visit. He has vastly improved overall- meeting some of his LTG including 10 MWT, 6 min walk, and TUG. He met all those goals and will reassess his remaining balance goals in next few session. He continues to requires some assistance with gym based exercises and plan to continue to train to make a smooth transition from skilled PT care to self care. Patient's condition has the potential to improve in response to therapy. Maximum improvement is yet to be obtained. The anticipated improvement is attainable and reasonable in a generally predictable time.  Patient reports  Pt will benefit from skilled PT to address strength, balance and coordination deficits to improve overall QoL and allow return to PLOF.    OBJECTIVE IMPAIRMENTS: Abnormal gait, cardiopulmonary status limiting activity, decreased balance, decreased cognition, decreased coordination, decreased endurance, decreased knowledge of condition, decreased knowledge of use of DME, decreased mobility, decreased strength, decreased safety awareness, impaired vision/preception, improper body mechanics, and postural dysfunction.   ACTIVITY LIMITATIONS: carrying, lifting, standing, squatting, stairs, and locomotion level  PARTICIPATION LIMITATIONS: cleaning, laundry, medication management, personal finances, driving, shopping, community activity, occupation, and yard work  PERSONAL FACTORS: Age, Behavior pattern, Education, and Fitness are also affecting patient's functional outcome.   REHAB POTENTIAL: Excellent  CLINICAL DECISION MAKING: Stable/uncomplicated  EVALUATION COMPLEXITY: Moderate  PLAN:  PT FREQUENCY: 1-2x/week  PT DURATION: 12 weeks  PLANNED INTERVENTIONS: Therapeutic exercises, Therapeutic activity, Neuromuscular re-education, Balance training, Gait training, Patient/Family education, Self Care,  Joint mobilization, Stair training, Vestibular training, DME instructions, Cognitive remediation, Electrical stimulation, Wheelchair mobility training, Cryotherapy, Moist heat, scar mobilization, Splintting, Taping, Biofeedback, Manual therapy, and Re-evaluation  PLAN FOR NEXT SESSION:  Conitnued to  promote return to gym    Lenda Kelp, PT 06/05/2023, 10:37 AM  10:37 AM, 06/05/23 Physical Therapist - [name]  Outpatient Physical Therapy- Main Campus [phone]

## 2023-06-05 NOTE — Therapy (Addendum)
Occupational Therapy Progress/Discharge Note  Dates of reporting period  04/21/2023   to   06/05/2023   Patient Name: Randall Parsons MRN: [id number] DOB:1981/08/05, 42 y.o., male Today's Date: 06/05/2023  PCP: Dr. Cheri Kearns REFERRING PROVIDER: Hillery Aldo  END OF SESSION:   OT End of Session - 06/05/23 0857     Visit Number 10    Number of Visits 24    OT Start Time 0847    OT Stop Time 0930    OT Time Calculation (min) 43 min    Activity Tolerance Patient tolerated treatment well    Behavior During Therapy [facility] for tasks assessed/performed                Past Medical History:  Diagnosis Date   Anxiety    Asthma    CHF (congestive heart failure) (HCC)    Depression    Exposure to hepatitis B    Exposure to hepatitis C    Hepatitis C    History of kidney stones    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, ending around 2017   Hypertension    IV drug abuse (HCC)    Myocardial infarction (HCC)    Renal disorder    kidney stones   Stroke 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/01/2023   Abscess of finger 01/31/2023   Hypokalemia 01/31/2023   Paranoid delusion (HCC) 01/31/2023   History of hepatitis C 01/31/2023   Toxic encephalopathy 01/10/2023   MDD (major depressive disorder), recurrent episode, severe (HCC) 12/22/2021   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) 12/18/2021   Left wrist pain 08/27/2021   Bronchospasm 02/18/2021   COVID-19 vaccine dose declined 12/20/2020   Hepatitis C test positive  12/20/2020   Prolactin increased 12/20/2020   Advance care planning 12/20/2020   GAD (generalized anxiety disorder) 04/04/2020   Attention deficit hyperactivity disorder, combined type 04/04/2020   Opioid type dependence, continuous (HCC) 04/04/2020   Major depressive disorder 07/14/2019   Alcohol abuse 07/14/2019   Cocaine abuse (HCC) 07/12/2019   Low back pain 11/28/2017   NICM (nonischemic cardiomyopathy) (HCC) 11/28/2017   Renal stone 11/28/2017   History of seizure 03/14/2017   IVDU (intravenous drug user) 03/14/2017   Other specified abnormal immunological findings in serum 12/21/2015    ONSET DATE: 01/11/2023  REFERRING DIAG: Toxic encephalopathy  THERAPY DIAG:  Muscle weakness (generalized)  Other lack of coordination  Rationale for Evaluation and Treatment: Rehabilitation  SUBJECTIVE:   SUBJECTIVE STATEMENT:  Pt. reports doing well today.  Pt accompanied by: significant other  PERTINENT HISTORY: Pt. is a 42 y.o. male who was admitted to the hospital on 01/10/2023 due to being found at a hotel unresponsive and apneic with pinpoint pupil. Pt. Was intubated for airway protection. Pt. Was admitted with acute hypoxic respiratory failure secondary to aspiration pneumonia, pulmonary edema, NSTEMI, and acute toxic encephalopathy cause by recreational drug overdose. Pt. has a history of polysubstance abuse, Hep C, IVDA, depression, ETOH,  substance induced mood disorder.   PRECAUTIONS: No drinking with a straw due to swallowing issues  WEIGHT BEARING RESTRICTIONS: No  PAIN:  Are you having pain? No  pain reports  FALLS: Has patient fallen in last 6 months? No  LIVING ENVIRONMENT: Lives with: lives with their family Lives in: House/apartment Stairs: Yes: Internal: 15 steps; on left going up however Pt. Reports he does not go upstairs very often because his bedroom is on main level, 1 external step to get into the house with no rails Has following equipment at home:  None  PLOF: Independent  PATIENT GOALS: Completing tasks such as making a bowl of cereal, balance, making the bed, using the phone  OBJECTIVE:   HAND DOMINANCE: Right  ADLs: Eating: Increased time, Has to think through the tasks when completing them now Grooming: Able to use razor safely however requires increased time to think through UB Dressing: Able to put shirt on however sometimes experiences difficulties when putting his arms through the holes LB Dressing: Able to put pants on however sometimes puts pants on backwards and requires him to switch them to proper position.  Toileting: Able to sit down on toilet and clean self however, struggles getting up by your self at times, however wife has not helped in a week with tolieting and reports he has done well. Bathing: Independent, sometimes have difficulties remembering what they washed  Tub Shower transfers: Independent Equipment:  None  IADLs: Shopping: Wife mostly does grocery shopping, Pt. Will sometime goes with spouse Light housekeeping: Difficulty making up the bed Meal Prep: Wife does most of the cooking, pt. Able to prepare light meal Community mobility: Towards end of day Medication management: Not taking any medicine currently Financial management: Wife does Handwriting: 100% legible  MOBILITY STATUS: Independent  POSTURE COMMENTS:  No Significant postural limitations Sitting balance:  WFL  ACTIVITY TOLERANCE: Activity tolerance: WFL  FUNCTIONAL OUTCOME MEASURES: FOTO: 63 TR:63  UPPER EXTREMITY ROM: [facility]  UPPER EXTREMITY MMT:     MMT Right eval Right 06/05/2023 Left eval Right 06/05/2023  Shoulder flexion 4+/5 5/5 4/5 5/5  Shoulder abduction 4+/5 5/5 4/5 5/5  Shoulder adduction      Shoulder extension      Shoulder internal rotation      Shoulder external rotation      Middle trapezius      Lower trapezius      Elbow flexion 5/5 5/5 4+/5 5/5  Elbow extension 5/5 5/5 4+/5 5/5  Wrist flexion 5/5  5/5 4/5 5/5  Wrist extension 5/5 5/5 4/5 5/5  Wrist ulnar deviation      Wrist radial deviation      Wrist pronation      Wrist supination      (Blank rows = not tested)  HAND FUNCTION: Grip strength: Right: 50 lbs; Left: 36 lbs, Lateral pinch: Right: 15 lbs, Left: 13 lbs, and 3 point pinch: Right: 15 lbs, Left: 10 lbs  06/05/2023:: Right: 86 lbs; Left: 76 lbs, Lateral pinch: Right: 25 lbs, Left: 22 lbs, and 3 point pinch: Right: 22 lbs, Left: 17 lbs  COORDINATION: 9 Hole Peg test: Right: 28 sec; Left: 32 sec  06/05/2023: 9 Hole Peg test: Right: 23 sec; Left: 24 sec  SENSATION:To be assessed further in functional contexts  EDEMA: None  MUSCLE TONE: None  COGNITION: Overall cognitive status: Impaired  VISION: Subjective report: Pt. Reports vision has gotten worse since the brain damage and unable to read his bible sometimes in church.  Pt. Was advised to go to eye doctor due to visual changes present. Baseline vision: Wears glasses for reading only Visual history: brain injury  VISION ASSESSMENT: To be further assessed in functional context  Patient has difficulty with following activities due to following visual impairments: Reading his bible at church  PERCEPTION: To be assessed further in functional contexts  PRAXIS: WFL  TODAY'S TREATMENT:                                                                                                                              DATE: 06/05/2023  Measurements were obtained, and goals were reviewed with the Pt.   PATIENT EDUCATION: Education details: compensatory dressing strategies  Person educated: Patient and Spouse Education method: explanation, vc Education comprehension: verbalized understanding  HOME EXERCISE PROGRAM: Theraputty exercises for L hand strengthing with green resistive theraputty  GOALS: Goals reviewed with patient? Yes  SHORT TERM GOALS: Target date: 07/03/2023  Pt. Will improve FOTO score by 2 points to  reflect improved perceived function performance in specific ADLS/IADLs.  Baseline: 06/05/2023: Score 98 Eval: FOTO: 63 TR: 63 Goal status: INITIAL   LONG TERM GOALS: Target date: 07/14/2023  Pt. Will improve L shoulder strength by 1 mm grade to be able to sustain UE elevation while completing overhead reaching tasks.  Baseline: 06/05/2023: BUE strength: 5/5 EVAL: L: Shoulder flexion: 4/5, Shoulder abduction: 4/5 Goal status: Met  2.  Pt will increase L hand grip strength by 5# to be able to securely hold objects, open containers, and jars. Baseline: 06/05/2023: Grip strength: Right: 86 lbs; Left: 76 lbs  Eval: Grip strength: Right: 50 lbs; Left: 36 lbs Goal status: Met  3.  Pt. Will independently make up his bed following the proper sequence of  steps needed to complete the task 100% of the time.  Baseline: 06/05/2023: Independent Eval: Pt. requires increased time, and assist to sequence through the steps of bed making tasks. Goal status: Met  4.  Pt. Will improve L [facility] skills by 2 seconds of speed to independently manipulate small objects during ADL/IADL tasks.  Baseline: 06/05/2023: 9 Hole Peg test: Right: 23 sec; Left: 24 sec. Eval: 9 Hole Peg test: Right: 28 sec; Left: 32 sec Goal status: Met  5. Pt. will independently identify, or demonstrate compensatory strategies for donning UE, and LE clothing in the correct direction. Baseline: 06/05/2023: Pt. Is independent with donning clothing efficiently, and consistently in the correct direction.  Eval: Pt. Puts clothing on backwards, inside out, on a different body part, or out of sequence. Goal status: Met  ASSESSMENT:  CLINICAL IMPRESSION:  Pt. has made excellent progress with ADL, and IADL functioning, BUE strength, grip strength, pinch strength, and FMC skills. Pt. has met goals established at the initial evaluation. FOTO score is 98. Pt. is now appropriate for discharge from OT services. No further OT services are warranted at this time.    PERFORMANCE DEFICITS: in functional skills including  ADLs, IADLs, coordination, strength, pain, Fine motor control, Gross motor control, balance, and UE functional use, cognitive skills including sequencing, and psychosocial skills including coping strategies, environmental adaptation, and routines and behaviors.   IMPAIRMENTS: are limiting patient from ADLs, IADLs, and work.   CO-MORBIDITIES: may have co-morbidities  that affects occupational performance. Patient will benefit from skilled OT to address above impairments and improve overall function.  MODIFICATION OR ASSISTANCE TO COMPLETE EVALUATION: No modification of tasks or assist necessary to complete an evaluation.  OT OCCUPATIONAL PROFILE AND HISTORY: Detailed assessment: Review of records and additional review of physical, cognitive, psychosocial history related to current functional performance.  CLINICAL DECISION MAKING: Moderate - several treatment options, min-mod task modification necessary  REHAB POTENTIAL: Good  EVALUATION COMPLEXITY: Moderate    PLAN:  OT FREQUENCY: 2x/week  OT DURATION: 12 weeks  PLANNED INTERVENTIONS: self care/ADL training, therapeutic exercise, therapeutic activity, neuromuscular re-education, patient/family education, and cognitive remediation/compensation  RECOMMENDED OTHER SERVICES: ST, PT  CONSULTED AND AGREED WITH PLAN OF CARE: Patient and family member/caregiver  PLAN FOR NEXT SESSION: Initiate treatment  Olegario Messier, MS, OTR/L  06/05/23, 8:59 AM

## 2023-06-11 ENCOUNTER — Ambulatory Visit: Payer: MEDICAID

## 2023-06-11 ENCOUNTER — Ambulatory Visit: Payer: MEDICAID | Admitting: Speech Pathology

## 2023-06-12 ENCOUNTER — Encounter: Payer: MEDICAID | Admitting: Occupational Therapy

## 2023-06-12 ENCOUNTER — Ambulatory Visit: Payer: MEDICAID | Attending: Family Medicine

## 2023-06-12 DIAGNOSIS — R2681 Unsteadiness on feet: Secondary | ICD-10-CM | POA: Insufficient documentation

## 2023-06-12 DIAGNOSIS — R278 Other lack of coordination: Secondary | ICD-10-CM | POA: Insufficient documentation

## 2023-06-12 DIAGNOSIS — R2689 Other abnormalities of gait and mobility: Secondary | ICD-10-CM | POA: Insufficient documentation

## 2023-06-12 DIAGNOSIS — M6281 Muscle weakness (generalized): Secondary | ICD-10-CM | POA: Insufficient documentation

## 2023-06-12 DIAGNOSIS — R262 Difficulty in walking, not elsewhere classified: Secondary | ICD-10-CM | POA: Diagnosis present

## 2023-06-12 DIAGNOSIS — R531 Weakness: Secondary | ICD-10-CM | POA: Diagnosis present

## 2023-06-12 NOTE — Therapy (Signed)
OUTPATIENT PHYSICAL THERAPY NEURO TREATMENT/discharge    Patient Name: Randall Parsons MRN: [id number] DOB:1981-04-03, 42 y.o., male Today's Date: 06/12/2023   PCP: Alease Medina, MD  REFERRING PROVIDER: Alease Medina, MD   END OF SESSION:  PT End of Session - 06/12/23 0826     Visit Number 11    Number of Visits 24    Date for PT Re-Evaluation 07/14/23    Authorization Type [employer]    Authorization Time Period 04/21/23-07/14/23    Progress Note Due on Visit 20    PT Start Time 0800    PT Stop Time 0840    PT Time Calculation (min) 40 min               Past Medical History:  Diagnosis Date   Anxiety    Asthma    CHF (congestive heart failure) (HCC)    Depression    Exposure to hepatitis B    Exposure to hepatitis C    Hepatitis C    History of kidney stones    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, ending around 2017   Hypertension    IV drug abuse (HCC)    Myocardial infarction (HCC)    Renal disorder    kidney stones   Stroke [facility])    Past Surgical History:  Procedure Laterality Date   APPENDECTOMY     EYE SURGERY     secondary to dog bite   I & D EXTREMITY Right 01/31/2023   Procedure: IRRIGATION AND DEBRIDEMENT INDEX FINGER;  Surgeon: Betha Loa, MD;  Location: [facility] OR;  Service: Orthopedics;  Laterality: Right;   TIBIA FRACTURE SURGERY Right    Patient Active Problem List   Diagnosis Date Noted   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/01/2023   Abscess of finger 01/31/2023   Hypokalemia 01/31/2023   Paranoid delusion (HCC) 01/31/2023   History of hepatitis C 01/31/2023   Toxic encephalopathy 01/10/2023   MDD (major depressive disorder), recurrent episode, severe (HCC) 12/22/2021   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) 12/18/2021   Left wrist pain 08/27/2021   Bronchospasm 02/18/2021   COVID-19 vaccine dose declined 12/20/2020   Hepatitis C test  positive 12/20/2020   Prolactin increased 12/20/2020   Advance care planning 12/20/2020   GAD (generalized anxiety disorder) 04/04/2020   Attention deficit hyperactivity disorder, combined type 04/04/2020   Opioid type dependence, continuous (HCC) 04/04/2020   Major depressive disorder 07/14/2019   Alcohol abuse 07/14/2019   Cocaine abuse (HCC) 07/12/2019   Low back pain 11/28/2017   NICM (nonischemic cardiomyopathy) (HCC) 11/28/2017   Renal stone 11/28/2017   History of seizure 03/14/2017   IVDU (intravenous drug user) 03/14/2017   Other specified abnormal immunological findings in serum 12/21/2015    ONSET DATE: 01/10/2023  REFERRING DIAG: G92.9 (ICD-10-CM) - Unspecified toxic encephalopathy   THERAPY DIAG:  Muscle weakness (generalized)  Other lack of coordination  Balance disorder  Unsteadiness on feet  Difficulty in walking, not elsewhere classified  Weakness generalized  Rationale for Evaluation and Treatment: Rehabilitation  SUBJECTIVE:  SUBJECTIVE STATEMENT: Patient reports feeling better- walking better and actually going to try to go to work starting next week part time as able.   Pt accompanied by: significant other "[name]"   PERTINENT HISTORY: Pt admitted to hospital after He was found at a hotel on 4/5 by staff unresponsive, apneic with pinpoint pupils. He was given Narcan in the field, after this became combative and staff was unable to redirect him. On arrival to the ER he remained combative, his jaw was clenched, he was hypoxic, and ultimately intubated for airway protection. Admitted with Acute hypoxic respiratory failure secondary to aspiration pneumonia, pulmonary edema, NSTEMI, in the setting of acute toxic encephalopathy caused by recreational drug overdose, history of  IVDA in the past. At hospital d/c on 4/13 on room air, will be discharged on rescue inhaler 3 more days of oral Levaquin with outpatient PCP follow-up.  Pt has has 2 subsequent ED visits for finger Abscess 01/2023 and Peg tub removal on 6/26 due to pain at peg site. From ED visit "PEG tube removed without difficulty. Dressings given. Outpatient follow-up with GI and hospice. Discharge home."  Per PT referral, pt has continued to have BLE with intermittent difficulty walking as well as getting up from toilet.   PAIN:  Are you having pain? Yes: NPRS scale: 5/10 Pain location: Neck  Pain description: stiffness  Aggravating factors: sleeping  Relieving factors: Chiropractor   Pain location #2 Yes: NPRS scale: 8/10 Pain location: Low bak Pain description: stiffness  Aggravating factors: sleeping  Relieving factors: ICE  PRECAUTIONS: Fall  RED FLAGS: None   WEIGHT BEARING RESTRICTIONS: No  FALLS: Has patient fallen in last 6 months? No   PATIENT GOALS: get back to "normal" get back to work if able. Improve balance    OBJECTIVE:  06/05/23  FOTO survey BBT scale 55/56 DGI 24   Lat pull down - L3 x 12 reps Chest Press - L4 x15 reps Lat pull down - L4 x 15 reps Chest Press - L5 x15 reps Lat pull down - L5 x 15 reps Chest Press - L6 x20  Leg Press - L7 x20 Seated Row - L5 x15  Leg Press - L7 x20 Seated Row - L5 x15     PATIENT EDUCATION: Education details: POC, Clinic Orientation. PT evaluation  Person educated: Patient and Spouse Education method: Explanation and Demonstration Education comprehension: verbalized understanding  HOME EXERCISE PROGRAM:  Access Code: [email or web address] URL: [email or web address] Date: 04/30/2023 Prepared by: Maureen Ralphs  Exercises - Sit to Stand with Arms Crossed  - 1 x daily - 3 x weekly - 3 sets - 10 reps - Forward Step Up with Counter Support  - 1 x daily - 3 x weekly - 3 sets - 10 reps - Side Stepping with Resistance  at Feet  - 1 x daily - 3 x weekly - 3 sets - 10 reps   GOALS: Goals reviewed with patient? Yes   SHORT TERM GOALS: Target date: 05/27/2023    Patient will be independent in home exercise program to improve strength/mobility for better functional independence with ADLs. Baseline: to be given at next session  Goal status: INITIAL   LONG TERM GOALS: Target date: 07/14/2023    Patient will increase FOTO score to equal to or greater than 65    to demonstrate statistically significant improvement in mobility and quality of life.  Baseline: 61; 74  Goal status: Met  2. Pt will increase 6 min walk test by >29ft to  demonstrate reduced fall risk and improved access to community.  Baseline: to be completed at next visit; 04/30/2023=890 feet without AD; 06/05/2023= 1410 feet  Goal status : MET   3.  Patient will increase Berg Balance score by > 6 points to demonstrate decreased fall risk during functional activities Baseline: 49;  06/12/23 55  Goal status: Met  4.  Patient will increase 10 meter walk test to >1.49m/s as to improve gait speed for better community ambulation and to reduce fall risk. Baseline: 0.50m/s; 06/05/2023= 1.2 m/s Goal status: MET  5.  Patient will reduce timed up and go to <11 seconds to reduce fall risk and demonstrate improved transfer/gait ability. Baseline: 12sec; 06/05/2023= 8.49 sec  Goal status: MET  6.  Patient will increase dynamic gait index score to >19/24 as to demonstrate reduced fall risk and improved dynamic gait balance for better safety with community/home ambulation.   Baseline: 18; 06/12/23 24  Goal status: INITIAL   ASSESSMENT:  CLINICAL IMPRESSION: Completed remaining tests and measures. Pt has impoved balance AEB near perfect scores on Berg and DGI, would need a more sensitive measures to show remaining deficits. Pt has returned to work now with good tolerance overall, albeit he describes how challenging it is. Pt ready for DC at this time,  encouraged to continue to work on balance deficits on the side.    OBJECTIVE IMPAIRMENTS: Abnormal gait, cardiopulmonary status limiting activity, decreased balance, decreased cognition, decreased coordination, decreased endurance, decreased knowledge of condition, decreased knowledge of use of DME, decreased mobility, decreased strength, decreased safety awareness, impaired vision/preception, improper body mechanics, and postural dysfunction.   ACTIVITY LIMITATIONS: carrying, lifting, standing, squatting, stairs, and locomotion level  PARTICIPATION LIMITATIONS: cleaning, laundry, medication management, personal finances, driving, shopping, community activity, occupation, and yard work  PERSONAL FACTORS: Age, Behavior pattern, Education, and Fitness are also affecting patient's functional outcome.   REHAB POTENTIAL: Excellent  CLINICAL DECISION MAKING: Stable/uncomplicated  EVALUATION COMPLEXITY: Moderate  PLAN:  PT FREQUENCY: 1-2x/week  PT DURATION: 12 weeks  PLANNED INTERVENTIONS: Therapeutic exercises, Therapeutic activity, Neuromuscular re-education, Balance training, Gait training, Patient/Family education, Self Care, Joint mobilization, Stair training, Vestibular training, DME instructions, Cognitive remediation, Electrical stimulation, Wheelchair mobility training, Cryotherapy, Moist heat, scar mobilization, Splintting, Taping, Biofeedback, Manual therapy, and Re-evaluation  PLAN FOR NEXT SESSION:  DC from therapy today     Alysah Carton C, PT 06/12/2023, 8:31 AM  8:31 AM, 06/12/23 Physical Therapist - [facility]  Outpatient Physical Therapy- Main Campus [phone]

## 2023-06-17 ENCOUNTER — Encounter: Payer: MEDICAID | Admitting: Speech Pathology

## 2023-06-17 ENCOUNTER — Ambulatory Visit: Payer: MEDICAID | Admitting: Physical Therapy

## 2023-06-19 ENCOUNTER — Ambulatory Visit: Payer: MEDICAID | Admitting: Speech Pathology

## 2023-06-19 ENCOUNTER — Ambulatory Visit: Payer: MEDICAID

## 2023-06-24 ENCOUNTER — Ambulatory Visit: Payer: MEDICAID | Admitting: Physical Therapy

## 2023-06-24 ENCOUNTER — Ambulatory Visit: Payer: MEDICAID | Admitting: Speech Pathology

## 2023-06-26 ENCOUNTER — Ambulatory Visit: Payer: MEDICAID | Admitting: Speech Pathology

## 2023-06-26 ENCOUNTER — Encounter: Payer: MEDICAID | Admitting: Occupational Therapy

## 2023-06-26 ENCOUNTER — Ambulatory Visit: Payer: MEDICAID

## 2023-07-01 ENCOUNTER — Ambulatory Visit: Payer: MEDICAID | Admitting: Physical Therapy

## 2023-07-01 ENCOUNTER — Ambulatory Visit: Payer: MEDICAID | Admitting: Speech Pathology

## 2023-07-03 ENCOUNTER — Encounter: Payer: MEDICAID | Admitting: Speech Pathology

## 2023-07-03 ENCOUNTER — Ambulatory Visit: Payer: MEDICAID

## 2023-07-08 ENCOUNTER — Ambulatory Visit: Payer: MEDICAID

## 2023-07-08 ENCOUNTER — Encounter: Payer: MEDICAID | Admitting: Speech Pathology

## 2023-07-10 ENCOUNTER — Ambulatory Visit: Payer: MEDICAID

## 2023-07-10 ENCOUNTER — Encounter: Payer: MEDICAID | Admitting: Speech Pathology

## 2023-07-11 ENCOUNTER — Ambulatory Visit (INDEPENDENT_AMBULATORY_CARE_PROVIDER_SITE_OTHER): Payer: MEDICAID | Admitting: Family Medicine

## 2023-07-11 ENCOUNTER — Encounter: Payer: Self-pay | Admitting: Family Medicine

## 2023-07-11 VITALS — BP 130/78 | HR 73 | Temp 98.2°F | Ht 69.0 in | Wt 174.0 lb

## 2023-07-11 DIAGNOSIS — M791 Myalgia, unspecified site: Secondary | ICD-10-CM | POA: Diagnosis not present

## 2023-07-11 MED ORDER — CYCLOBENZAPRINE HCL 5 MG PO TABS: 5.0000 mg | ORAL_TABLET | Freq: Three times a day (TID) | ORAL | 1 refills | Status: DC | PRN

## 2023-07-11 NOTE — Patient Instructions (Signed)
Let me know if you are willing to see psychiatry.  Thanks for your effort.  Flexeril if needed.  Sedation caution.  Take care.  Glad to see you.
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
## 2023-07-12 ENCOUNTER — Encounter: Payer: Self-pay | Admitting: Family Medicine

## 2023-07-13 DIAGNOSIS — M791 Myalgia, unspecified site: Secondary | ICD-10-CM | POA: Insufficient documentation

## 2023-07-13 NOTE — Assessment & Plan Note (Signed)
Can use Flexeril sedation caution.  At this point okay for outpatient follow-up.  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  No suicidal or homicidal intent.  Routine cautions given to patient.  I asked him to update me as needed.

## 2023-07-14 ENCOUNTER — Ambulatory Visit: Payer: MEDICAID | Admitting: Family Medicine

## 2023-07-15 ENCOUNTER — Ambulatory Visit: Payer: MEDICAID

## 2023-07-15 ENCOUNTER — Encounter: Payer: MEDICAID | Admitting: Occupational Therapy

## 2023-07-15 ENCOUNTER — Encounter: Payer: MEDICAID | Admitting: Speech Pathology

## 2023-07-15 NOTE — Telephone Encounter (Signed)
Patient called in to follow up on this message. He stated that he would to go ahead and get the referral sent in to a therapist. He stated that he is leaving out Thursday to go help in [place] but should be back by Sunday. He is looking for someone who excepts medicaid, somewhere close by, that will allow virtual visit and have some appointments after 5 due to work. Thank you!

## 2023-07-16 ENCOUNTER — Other Ambulatory Visit: Payer: Self-pay | Admitting: Family Medicine

## 2023-07-16 DIAGNOSIS — F39 Unspecified mood [affective] disorder: Secondary | ICD-10-CM

## 2023-07-17 ENCOUNTER — Ambulatory Visit: Payer: MEDICAID

## 2023-07-17 ENCOUNTER — Encounter: Payer: MEDICAID | Admitting: Speech Pathology

## 2023-07-20 ENCOUNTER — Telehealth: Payer: Self-pay | Admitting: Family Medicine

## 2023-07-20 NOTE — Telephone Encounter (Signed)
Please triage patient about his symptoms in terms of his back pain, especially the location of his back pain.  We may be able to set up x-rays here.  Thanks.

## 2023-07-21 ENCOUNTER — Ambulatory Visit: Payer: MEDICAID | Admitting: Family Medicine

## 2023-07-21 NOTE — Telephone Encounter (Signed)
Patient is rescheduled for 4pm tomorrow and will see then.  Thanks.

## 2023-07-21 NOTE — Telephone Encounter (Signed)
I spoke with pt; pt said the neck pain is better since seeing chiropractor; pt said still having lower back pain all the way across lower back and if pt lifts something the pain shoots to the lt side of lower back. Pain level now is 9. Pt does not have UTI symptoms and pt has hx of kidney stones but pt is sure not kidney stone pain. Pt said Flexeril helps back pain slightly and ASA does not help pain at all. Pt last seen 07/11/23. Pt scheduled appt with Dr Para March today at 2 pm and pt will be at [facility] at 1:45. [facility]. Sending note to Dr Para March and Para March pool.UC and ED precautions given and pt voiced understanding.Marland Kitchen

## 2023-07-22 ENCOUNTER — Encounter: Payer: Self-pay | Admitting: Family Medicine

## 2023-07-22 ENCOUNTER — Encounter: Payer: Self-pay | Admitting: *Deleted

## 2023-07-22 ENCOUNTER — Ambulatory Visit (INDEPENDENT_AMBULATORY_CARE_PROVIDER_SITE_OTHER): Payer: MEDICAID | Admitting: Family Medicine

## 2023-07-22 ENCOUNTER — Ambulatory Visit: Payer: MEDICAID

## 2023-07-22 ENCOUNTER — Ambulatory Visit (INDEPENDENT_AMBULATORY_CARE_PROVIDER_SITE_OTHER)
Admission: RE | Admit: 2023-07-22 | Discharge: 2023-07-22 | Disposition: A | Discharge status: Home / Self Care | Payer: MEDICAID | Source: Ambulatory Visit | Attending: Family Medicine | Admitting: Family Medicine

## 2023-07-22 ENCOUNTER — Encounter: Payer: MEDICAID | Admitting: Speech Pathology

## 2023-07-22 VITALS — BP 132/68 | HR 86 | Temp 97.8°F | Ht 69.0 in | Wt 176.2 lb

## 2023-07-22 DIAGNOSIS — M549 Dorsalgia, unspecified: Secondary | ICD-10-CM | POA: Diagnosis not present

## 2023-07-22 DIAGNOSIS — M545 Low back pain, unspecified: Secondary | ICD-10-CM | POA: Diagnosis not present

## 2023-07-22 MED ORDER — OMEPRAZOLE 20 MG PO CPDR: 20.0000 mg | DELAYED_RELEASE_CAPSULE | Freq: Every day | ORAL | 3 refills | Status: DC

## 2023-07-22 MED ORDER — CYCLOBENZAPRINE HCL 10 MG PO TABS: 10.0000 mg | ORAL_TABLET | Freq: Three times a day (TID) | ORAL | 1 refills | Status: DC | PRN

## 2023-07-22 MED ORDER — MELOXICAM 15 MG PO TABS: 15.0000 mg | ORAL_TABLET | Freq: Every day | ORAL | 0 refills | Status: DC

## 2023-07-22 NOTE — Patient Instructions (Addendum)
Xrays on the way out.  Try meloxicam with prilosec and flexeril if needed.  Take care.  Glad to see you.

## 2023-07-22 NOTE — Progress Notes (Unsigned)
Back from [place] 2 days ago.  Lower back pain, more than in the C spine area.  Shocking pain when present.  No blood seen in urine.  No burning with urination. More pain with lifting.  Has been doing sheet rock work. Still with pain with talking flexeril 5mg  per dose.  No radicular pain.  He had prev eval/tx at chiropractor.  Pain twisting. No focal weakness.   Meds, vitals, and allergies reviewed.   ROS: Per HPI unless specifically indicated in ROS section   Nad but uncomfortable.  Neck supple, no LA Rrr Ctab Abd soft not ttp Pain with twisting torso L and R. SLR neg B.  Back ttp in lower back, midline and laterally Skin w/o rash.

## 2023-07-23 ENCOUNTER — Other Ambulatory Visit: Payer: Self-pay | Admitting: Family Medicine

## 2023-07-23 DIAGNOSIS — M549 Dorsalgia, unspecified: Secondary | ICD-10-CM

## 2023-07-23 NOTE — Assessment & Plan Note (Signed)
Can try changing to meloxicam, take with food and PPI, inc flexeril to 10mg  per dose.  See notes on imaging.

## 2023-07-24 ENCOUNTER — Encounter: Payer: Self-pay | Admitting: *Deleted

## 2023-07-24 ENCOUNTER — Ambulatory Visit: Payer: MEDICAID

## 2023-07-24 ENCOUNTER — Encounter: Payer: MEDICAID | Admitting: Speech Pathology

## 2023-07-29 ENCOUNTER — Encounter: Payer: MEDICAID | Admitting: Speech Pathology

## 2023-07-29 ENCOUNTER — Ambulatory Visit: Payer: MEDICAID

## 2023-07-30 ENCOUNTER — Ambulatory Visit: Payer: MEDICAID | Admitting: Nurse Practitioner

## 2023-07-31 ENCOUNTER — Ambulatory Visit: Payer: MEDICAID

## 2023-07-31 ENCOUNTER — Encounter: Payer: MEDICAID | Admitting: Speech Pathology

## 2023-08-04 ENCOUNTER — Telehealth: Payer: Self-pay

## 2023-08-04 NOTE — Telephone Encounter (Signed)
I spoke with pt and he will come by office to sign record release. Nothing further needed at this time.

## 2023-08-14 ENCOUNTER — Other Ambulatory Visit: Payer: Self-pay | Admitting: Family Medicine

## 2023-08-14 NOTE — Telephone Encounter (Signed)
Prescription Request  08/14/2023  LOV: 07/22/2023  What is the name of the medication or equipment? cyclobenzaprine (FLEXERIL) 10 MG tablet   Have you contacted your pharmacy to request a refill? Yes   Which pharmacy would you like this sent to?  [id number] Judithann Sheen, [place] - [address] [place] [address] Phone: [phone] Fax: [phone]    Patient notified that their request is being sent to the clinical staff for review and that they should receive a response within 2 business days.   Please advise at [facility] [phone]

## 2023-08-15 NOTE — Telephone Encounter (Signed)
Patient called in and stated that he was looking at the wrong pill bottle. He stated that he has about 6 days left of the medication. He stated sorry for the misunderstanding.

## 2023-08-15 NOTE — Telephone Encounter (Signed)
Called and spoke with pt. The prescription that was sent in for 30 tablets has not lasted the entire month.  Pt is taking the medication TID every day due to the muscle spasms he is having.

## 2023-08-17 MED ORDER — CYCLOBENZAPRINE HCL 10 MG PO TABS: 10.0000 mg | ORAL_TABLET | Freq: Three times a day (TID) | ORAL | 0 refills | Status: DC | PRN

## 2023-08-17 NOTE — Telephone Encounter (Signed)
I sent the refill to get when needed.  Thanks.

## 2023-09-12 ENCOUNTER — Other Ambulatory Visit: Payer: Self-pay

## 2023-09-12 ENCOUNTER — Emergency Department
Admission: EM | Admit: 2023-09-12 | Discharge: 2023-09-12 | Disposition: A | Discharge status: Home / Self Care | Payer: MEDICAID | Attending: Student in an Organized Health Care Education/Training Program | Admitting: Student in an Organized Health Care Education/Training Program

## 2023-09-12 DIAGNOSIS — I110 Hypertensive heart disease with heart failure: Secondary | ICD-10-CM | POA: Diagnosis not present

## 2023-09-12 DIAGNOSIS — K0889 Other specified disorders of teeth and supporting structures: Secondary | ICD-10-CM | POA: Diagnosis present

## 2023-09-12 DIAGNOSIS — I509 Heart failure, unspecified: Secondary | ICD-10-CM | POA: Diagnosis not present

## 2023-09-12 MED ORDER — CLINDAMYCIN HCL 150 MG PO CAPS: 300.0000 mg | ORAL_CAPSULE | Freq: Three times a day (TID) | ORAL | 0 refills | Status: DC

## 2023-09-12 MED ORDER — OXYCODONE-ACETAMINOPHEN 5-325 MG PO TABS: 1.0000 | ORAL_TABLET | ORAL | 0 refills | Status: DC | PRN

## 2023-09-12 NOTE — Discharge Instructions (Signed)
OPTIONS FOR DENTAL FOLLOW UP CARE ° °[place] Department of Health and Human Services - Local Safety Net Dental Clinics °http://www.ncdhhs.gov/dph/oralhealth/services/safetynetclinics.htm °  °[facility] (336-562-3123) ° °[facility] [place] (919-933-9087) ° °[facility] (919-663-1744 ext [phone]) ° °[facility] ([phone]) ° °[facility] (919-968-2025) °This clinic caters to the indigent population and is on a lottery system. °Location: °[facility], [address], [place] °Clinic Hours: °Wednesdays from 6pm - 9pm, patients seen by a lottery system. °For dates, call or go to www.med.[facility].edu/[facility]/patients/Dental-[facility] °Services: °Cleanings, fillings and simple extractions. °Payment Options: °DENTAL WORK IS FREE OF CHARGE. Bring proof of income or support. °Best way to get seen: °Arrive at 5:15 pm - this is a lottery, NOT first come/first serve, so arriving earlier will not increase your chances of being seen. °  °  °[facility] °919-537-3737 °Select option 1 for emergencies °  °Location: °[facility], [address], [place] °Clinic Hours: °No walk-ins accepted - call the day before to schedule an appointment. °Check in times are 9:30 am and 1:30 pm. °Services: °Simple extractions, temporary fillings, pulpectomy/pulp debridement, uncomplicated abscess drainage. °Payment Options: °PAYMENT IS DUE AT THE TIME OF SERVICE.  Fee is usually $100-200, additional surgical procedures (e.g. abscess drainage) may be extra. °Cash, checks, Visa/MasterCard accepted.  Can file Medicaid if patient is covered for dental - patient should call case worker to check. °No discount for [facility] patients. °Best way to get seen: °MUST call the day before and get onto the schedule. Can usually be seen the next 1-2 days. No walk-ins accepted. °  °  °[facility] °[phone] °   °Location: °[facility], [address], [place] °Clinic Hours: °M, W, Th, F 8am or 1:30pm, Tues 9a or 1:30 - first come/first served. °Services: °Simple extractions, temporary fillings, uncomplicated abscess drainage.  You do not need to be an [place] resident. °Payment Options: °PAYMENT IS DUE AT THE TIME OF SERVICE. °Dental insurance, otherwise sliding scale - bring proof of income or support. °Depending on income and treatment needed, cost is usually $50-200. °Best way to get seen: °Arrive early as it is first come/first served. °  °  °[facility] °919-542-1641 °  °Location: °[address] °Clinic Hours: °Mon-Thu 8a-5p °Services: °Most basic dental services including extractions and fillings. °Payment Options: °PAYMENT IS DUE AT THE TIME OF SERVICE. °Sliding scale, up to 50% off - bring proof if income or support. °Medicaid with dental option accepted. °Best way to get seen: °Call to schedule an appointment, can usually be seen within 2 weeks OR they will try to see walk-ins - show up at 8a or 2p (you may have to wait). °  °  °[facility] °919-245-2435 °[place] RESIDENTS ONLY °  °Location: °[facility], [address], [place], [place] [address] °Clinic Hours: By appointment only. °Monday - Thursday 8am-5pm, Friday 8am-12pm °Services: Cleanings, fillings, extractions. °Payment Options: °PAYMENT IS DUE AT THE TIME OF SERVICE. °Cash, Visa or MasterCard. Sliding scale - $30 minimum per service. °Best way to get seen: °Come in to office, complete packet and make an appointment - need proof of income °or support monies for each household member and proof of [place] residence. °Usually takes about a month to get in. °  °  °[facility] °[phone] °  °Location: °[address]   [place] °Clinic Hours: Walk-in Urgent Care Dental Services are offered Monday-Friday  mornings only. °The numbers of emergencies accepted daily is limited to the number of °providers available. °Maximum 15 - Mondays, Wednesdays & Thursdays °Maximum 10 - Tuesdays & Fridays °Services: °You do not need to be a [place] resident to be seen for a dental emergency. °Emergencies are defined as pain, swelling, abnormal bleeding, or dental trauma. Walkins will receive x-rays if needed. °NOTE: Dental cleaning is not an emergency. °Payment Options: °PAYMENT IS DUE AT THE TIME OF SERVICE. °Minimum co-pay is $40.00 for uninsured patients. °Minimum co-pay is $3.00 for Medicaid with dental coverage. °Dental Insurance is accepted and must be presented at time of visit. °Medicare does not cover dental. °Forms of payment: Cash, credit card, checks. °Best way to get seen: °If not previously registered with the clinic, walk-in dental registration begins at 7:15 am and is on a first come/first serve basis. °If previously registered with the clinic, call to make an appointment. °  °  °[facility] °919-776-4359 °[place] RESIDENTS ONLY °  °Location: °[address], [place], [place] °Clinic Hours: °Mon-Thu 10a-2p °Services: Extractions only! °Payment Options: °FREE (donations accepted) - bring proof of income or support °Best way to get seen: °Call and schedule an appointment OR come at 8am on the 1st Monday of every month (except for holidays) when it is first come/first served. °  °  °[facility] °[phone] °  °Location: °[address], [place] °Clinic Hours: °Friday mornings °Services, Payment Options, Best way to get seen: °Call for info °

## 2023-09-12 NOTE — ED Provider Notes (Signed)
[facility] Provider Note    Event Date/Time   First MD Initiated Contact with Patient 09/12/23 1316     (approximate)   History   Dental Pain   HPI  Randall Parsons is a 42 y.o. male with history of hep C, CHF, hypertension presents emergency department with left lower dental pain.  Patient states he feels like tooth needs to be pulled.  No swelling at this time.  Is been taking ibuprofen without any relief.  No fever, chills, chest pain or shortness of breath      Physical Exam   Triage Vital Signs: ED Triage Vitals  Encounter Vitals Group     BP 09/12/23 1312 129/80     Systolic BP Percentile --      Diastolic BP Percentile --      Pulse Rate 09/12/23 1312 70     Resp 09/12/23 1312 18     Temp 09/12/23 1312 98.2 F (36.8 C)     Temp src --      SpO2 09/12/23 1312 98 %     Weight 09/12/23 1310 150 lb (68 kg)     Height 09/12/23 1310 5\' 5"  (1.651 m)     Head Circumference --      Peak Flow --      Pain Score 09/12/23 1310 10     Pain Loc --      Pain Education --      Exclude from Growth Chart --     Most recent vital signs: Vitals:   09/12/23 1312  BP: 129/80  Pulse: 70  Resp: 18  Temp: 98.2 F (36.8 C)  SpO2: 98%     General: Awake, no distress.   CV:  Good peripheral perfusion. regular rate and  rhythm Resp:  Normal effort. Lungs cta Abd:  No distention.   Other:  Left lower molar decayed, some redness at the gumline, neck is supple, no lymphadenopathy   ED Results / Procedures / Treatments   Labs (all labs ordered are listed, but only abnormal results are displayed) Labs Reviewed - No data to display   EKG     RADIOLOGY     PROCEDURES:   Procedures   MEDICATIONS ORDERED IN ED: Medications - No data to display   IMPRESSION / MDM / ASSESSMENT AND PLAN / ED COURSE  I reviewed the triage vital signs and the nursing notes.                              Differential diagnosis includes, but is  not limited to, dental abscess, dental pain, malingering  Patient's presentation is most consistent with acute, uncomplicated illness.   Patient appears to have a slight infection at the tooth.  He did ask if we could pull the tooth.  Explained to him we do not pull teeth here as were not dentist.  He should follow-up with the dental clinics provided on his discharge papers.  Given a prescription for clindamycin and for 8 Percocet.  He is to return the emergency department worsening.  In agreement treatment plan.  Discharged stable condition.      FINAL CLINICAL IMPRESSION(S) / ED DIAGNOSES   Final diagnoses:  Pain, dental     Rx / DC Orders   ED Discharge Orders          Ordered    clindamycin (CLEOCIN) 150 MG capsule  3 times daily  09/12/23 1317    oxyCODONE-acetaminophen (PERCOCET) 5-325 MG tablet  Every 4 hours PRN        09/12/23 1317             Note:  This document was prepared using Dragon voice recognition software and may include unintentional dictation errors.    Faythe Ghee, PA-C 09/12/23 1549    Willy Eddy, MD 09/12/23 [phone]

## 2023-09-12 NOTE — ED Triage Notes (Signed)
Pt comes with c/o left lower dental pain since two days ago. Pt states pain got worse last night. Pt states 10/10 pain.

## 2023-10-16 ENCOUNTER — Ambulatory Visit: Payer: MEDICAID | Admitting: Family

## 2023-10-19 ENCOUNTER — Other Ambulatory Visit: Payer: Self-pay | Admitting: Family Medicine

## 2023-11-13 ENCOUNTER — Telehealth: Payer: Self-pay | Admitting: Family Medicine

## 2023-11-13 ENCOUNTER — Other Ambulatory Visit: Payer: MEDICAID

## 2023-11-13 DIAGNOSIS — Z111 Encounter for screening for respiratory tuberculosis: Secondary | ICD-10-CM

## 2023-11-13 DIAGNOSIS — Z87898 Personal history of other specified conditions: Secondary | ICD-10-CM

## 2023-11-13 DIAGNOSIS — Z119 Encounter for screening for infectious and parasitic diseases, unspecified: Secondary | ICD-10-CM

## 2023-11-13 DIAGNOSIS — F39 Unspecified mood [affective] disorder: Secondary | ICD-10-CM

## 2023-11-13 NOTE — Telephone Encounter (Signed)
 I am assuming he needs inpatient treatment re: substance use.  I put in the referral to SW but I need extra details from patient about current status/safety/etc.  Does he have a specific program in mind?  Please let me know.

## 2023-11-13 NOTE — Telephone Encounter (Signed)
 Reached out to patient and did verify that it is inpatient treatment that he is seeking for substance abuse. The location that he is interested in is [facility] out of [place]. He has actually been going there for meetings for the past 9+ weeks once a week in the morning to have breakfast, meet and other things. The inpatient portion is the last steps.  They are requesting that he has certain labs done prior to becoming inpatient (HIV test, Hep C and TB test)   He does feel safe and supported.   He is aware that social work may step in to assist with this and he is thankful for the assistance.

## 2023-11-13 NOTE — Addendum Note (Signed)
 Addended by: Donnie Galea on: 11/13/2023 04:10 PM   Modules accepted: Orders

## 2023-11-13 NOTE — Addendum Note (Signed)
 Addended by: Donnie Galea on: 11/13/2023 04:54 PM   Modules accepted: Orders

## 2023-11-13 NOTE — Telephone Encounter (Signed)
 Please have him send any intake papers to so we can see if any other labs are needed.  Thanks. I put in the orders for HIV test, Hep C and TB test.

## 2023-11-13 NOTE — Telephone Encounter (Signed)
 Patient was added on to schedule for today. I reached out to patient to get more information. States that he needs for a residency program. Advised patient Dr. Cleatus is out of the office. We will send him a message. Once orders are placed we can call back and set up message. Patient states Dr. Cleatus is aware of what he needs.

## 2023-11-14 ENCOUNTER — Other Ambulatory Visit (INDEPENDENT_AMBULATORY_CARE_PROVIDER_SITE_OTHER): Payer: MEDICAID

## 2023-11-14 ENCOUNTER — Other Ambulatory Visit: Payer: MEDICAID

## 2023-11-14 DIAGNOSIS — Z111 Encounter for screening for respiratory tuberculosis: Secondary | ICD-10-CM

## 2023-11-14 DIAGNOSIS — Z119 Encounter for screening for infectious and parasitic diseases, unspecified: Secondary | ICD-10-CM

## 2023-11-14 DIAGNOSIS — F39 Unspecified mood [affective] disorder: Secondary | ICD-10-CM | POA: Diagnosis not present

## 2023-11-14 DIAGNOSIS — Z87898 Personal history of other specified conditions: Secondary | ICD-10-CM

## 2023-11-14 NOTE — Telephone Encounter (Signed)
 Patient notified. When he comes in for his lab appointment today he will leave at the front desk the fax number for [facility] so that I can fax the results of his labs once we receie

## 2023-11-17 ENCOUNTER — Telehealth: Payer: Self-pay

## 2023-11-17 LAB — HCV RNA,QUANTITATIVE REAL TIME PCR
HCV Quantitative Log: <1.18 {Log}
HCV RNA, PCR, QN: <15 [IU]/mL

## 2023-11-17 LAB — HIV ANTIBODY (ROUTINE TESTING W REFLEX): HIV 1&2 Ab, 4th Generation: NONREACTIVE

## 2023-11-17 LAB — HEPATITIS C ANTIBODY: Hepatitis C Ab: REACTIVE — AB

## 2023-11-17 NOTE — Telephone Encounter (Signed)
 Patient contacted

## 2023-11-17 NOTE — Telephone Encounter (Signed)
 Copied from CRM [id number]. Topic: Clinical - Lab/Test Results >> Nov 17, 2023 10:23 AM Corin V wrote: Reason for CRM: Patient is calling asking for Dr. Harrel Lim nurse to call back and explain the Hep C results to him with him having the previous exposure. He also needs the TB test resulted.  When all 3 tests are back, please fax results to [facility] at [phone]

## 2023-11-18 LAB — QUANTIFERON-TB GOLD PLUS
Mitogen-NIL: >10 [IU]/mL
NIL: 0.01 [IU]/mL
QuantiFERON-TB Gold Plus: NEGATIVE
TB1-NIL: 0.01 [IU]/mL
TB2-NIL: 0 [IU]/mL

## 2023-11-24 ENCOUNTER — Encounter: Payer: Self-pay | Admitting: Family Medicine

## 2024-02-06 ENCOUNTER — Emergency Department
Admission: EM | Admit: 2024-02-06 | Discharge: 2024-02-07 | Disposition: A | Discharge status: Other Acute Inpatient Hospital | Payer: MEDICAID | Attending: Emergency Medicine | Admitting: Emergency Medicine

## 2024-02-06 ENCOUNTER — Other Ambulatory Visit: Payer: Self-pay

## 2024-02-06 DIAGNOSIS — F419 Anxiety disorder, unspecified: Secondary | ICD-10-CM | POA: Insufficient documentation

## 2024-02-06 DIAGNOSIS — F323 Major depressive disorder, single episode, severe with psychotic features: Secondary | ICD-10-CM | POA: Diagnosis present

## 2024-02-06 DIAGNOSIS — R45850 Homicidal ideations: Secondary | ICD-10-CM | POA: Insufficient documentation

## 2024-02-06 DIAGNOSIS — R45851 Suicidal ideations: Secondary | ICD-10-CM | POA: Diagnosis not present

## 2024-02-06 LAB — COMPREHENSIVE METABOLIC PANEL WITH GFR
ALT: 58 U/L — ABNORMAL HIGH (ref 0–44)
AST: 29 U/L (ref 15–41)
Albumin: 4 g/dL (ref 3.5–5.0)
Alkaline Phosphatase: 58 U/L (ref 38–126)
Anion gap: 9 (ref 5–15)
BUN: 15 mg/dL (ref 6–20)
CO2: 25 mmol/L (ref 22–32)
Calcium: 8.8 mg/dL — ABNORMAL LOW (ref 8.9–10.3)
Chloride: 103 mmol/L (ref 98–111)
Creatinine, Ser: 0.89 mg/dL (ref 0.61–1.24)
GFR, Estimated: >60 mL/min (ref >60)
Glucose, Bld: 111 mg/dL — ABNORMAL HIGH (ref 70–99)
Potassium: 3.8 mmol/L (ref 3.5–5.1)
Sodium: 137 mmol/L (ref 135–145)
Total Bilirubin: 0.6 mg/dL (ref 0.0–1.2)
Total Protein: 7.3 g/dL (ref 6.5–8.1)

## 2024-02-06 LAB — URINE DRUG SCREEN, QUALITATIVE (ARMC ONLY)
Amphetamines, Ur Screen: NOT DETECTED
Barbiturates, Ur Screen: NOT DETECTED
Benzodiazepine, Ur Scrn: NOT DETECTED
Cannabinoid 50 Ng, Ur ~~LOC~~: NOT DETECTED
Cocaine Metabolite,Ur ~~LOC~~: NOT DETECTED
MDMA (Ecstasy)Ur Screen: NOT DETECTED
Methadone Scn, Ur: NOT DETECTED
Opiate, Ur Screen: NOT DETECTED
Phencyclidine (PCP) Ur S: NOT DETECTED
Tricyclic, Ur Screen: NOT DETECTED

## 2024-02-06 LAB — CBC
HCT: 44.3 % (ref 39.0–52.0)
Hemoglobin: 14.5 g/dL (ref 13.0–17.0)
MCH: 29.5 pg (ref 26.0–34.0)
MCHC: 32.7 g/dL (ref 30.0–36.0)
MCV: 90.2 fL (ref 80.0–100.0)
Platelets: 333 10*3/uL (ref 150–400)
RBC: 4.91 MIL/uL (ref 4.22–5.81)
RDW: 12.8 % (ref 11.5–15.5)
WBC: 5.1 10*3/uL (ref 4.0–10.5)
nRBC: 0 % (ref 0.0–0.2)

## 2024-02-06 LAB — ETHANOL: Alcohol, Ethyl (B): <15 mg/dL (ref <15)

## 2024-02-06 LAB — ACETAMINOPHEN LEVEL: Acetaminophen (Tylenol), Serum: <10 ug/mL — ABNORMAL LOW (ref 10–30)

## 2024-02-06 LAB — SALICYLATE LEVEL: Salicylate Lvl: <7 mg/dL — ABNORMAL LOW (ref 7.0–30.0)

## 2024-02-06 MED ORDER — LORAZEPAM 1 MG PO TABS
1.0000 mg | ORAL_TABLET | Freq: Once | ORAL | Status: AC
  Administered 2024-02-06: 1 mg via ORAL
  Filled 2024-02-06: qty 1

## 2024-02-06 NOTE — ED Notes (Signed)
 Hospital meal provided.  100% consumed, pt tolerated w/o complaints.  Waste discarded appropriately.

## 2024-02-06 NOTE — ED Triage Notes (Addendum)
 Patient C/O SI and "extreme anxiety" that began this morning. He does have a history of an SI attempt in the past. Of note, patient abruptly quit taking his zyprexa  two days ago because he said he didn't like the way it made him feel. He states that now he feels worse. Patient currently resides at a shelter.

## 2024-02-06 NOTE — ED Notes (Signed)
Pt speaking with TTS 

## 2024-02-06 NOTE — ED Notes (Signed)
 VOL per NP consult patient inpatient admit to psych

## 2024-02-06 NOTE — ED Notes (Signed)
Snack given to pt.

## 2024-02-06 NOTE — ED Provider Notes (Signed)
 [facility] Provider Note    Event Date/Time   First MD Initiated Contact with Patient 02/06/24 [phone]     (approximate)   History   Suicidal   HPI  Randall Parsons is a 43 y.o. male who comes in with exercise extreme anxiety that began this morning.  He does have a history of an SI attempt in the past did report to stop taking Zyprexa  2 days ago because did not think he liked the way it made him feel.  Patient does report some SI with plan to cut his wrist but he denies making attempts today or taking any medications to try to harm himself.  He does report some vague HI.  Denies it being towards a specific person just that he feels like he may snap back if someone does something to him.  He denies any other medical concerns.  Physical Exam   Triage Vital Signs: ED Triage Vitals [02/06/24 0645]  Encounter Vitals Group     BP (!) 126/98     Systolic BP Percentile      Diastolic BP Percentile      Pulse Rate 84     Resp (!) 24     Temp 97.8 F (36.6 C)     Temp Source Oral     SpO2 100 %     Weight 170 lb (77.1 kg)     Height 5\' 5"  (1.651 m)     Head Circumference      Peak Flow      Pain Score      Pain Loc      Pain Education      Exclude from Growth Chart     Most recent vital signs: Vitals:   02/06/24 0645  BP: (!) 126/98  Pulse: 84  Resp: (!) 24  Temp: 97.8 F (36.6 C)  SpO2: 100%     General: Awake, no distress.  CV:  Good peripheral perfusion.  Resp:  Normal effort.  Abd:  No distention.  Other:  Patient appears anxious trembling.  Positive SI, HI   ED Results / Procedures / Treatments   Labs (all labs ordered are listed, but only abnormal results are displayed) Labs Reviewed  COMPREHENSIVE METABOLIC PANEL WITH GFR - Abnormal; Notable for the following components:      Result Value   Glucose, Bld 111 (*)    Calcium  8.8 (*)    ALT 58 (*)    All other components within normal limits  ETHANOL  CBC  URINE DRUG  SCREEN, QUALITATIVE ([facility] ONLY)    PROCEDURES:  Critical Care performed: No  Procedures   MEDICATIONS ORDERED IN ED: Medications  LORazepam  (ATIVAN ) tablet 1 mg (1 mg Oral Given 02/06/24 0756)     IMPRESSION / MDM / ASSESSMENT AND PLAN / ED COURSE  I reviewed the triage vital signs and the nursing notes.   Patient's presentation is most consistent with acute presentation with potential threat to life or bodily function.   Pt is without any acute medical complaints.  Offered patient Zyprexa  but patient states that he does not want to take that.  Will give 1 mg of Ativan  given patient's anxiety.  No exam findings to suggest medical cause of current presentation. Will order psychiatric screening labs and discuss further w/ psychiatric service.  At this time patient comes here voluntary and would like to stay voluntarily.  D/d includes but is not limited to psychiatric disease, behavioral/personality disorder, inadequate socioeconomic  support, medical.  Based on HPI, exam, unremarkable labs, no concern for acute medical problem at this time. No rigidity, clonus, hyperthermia, focal neurologic deficit, diaphoresis, tachycardia, meningismus, ataxia, gait abnormality or other finding to suggest this visit represents a non-psychiatric problem. Screening labs reviewed.    Given this, pt medically cleared, to be dispositioned per Psych.    The patient has been placed in psychiatric observation due to the need to provide a safe environment for the patient while obtaining psychiatric consultation and evaluation, as well as ongoing medical and medication management to treat the patient's condition.  The patient has not been placed under full IVC at this time.    The patient is on the cardiac monitor to evaluate for evidence of arrhythmia and/or significant heart rate changes.      FINAL CLINICAL IMPRESSION(S) / ED DIAGNOSES   Final diagnoses:  Suicidal ideation     Rx / DC Orders    ED Discharge Orders     None        Note:  This document was prepared using Dragon voice recognition software and may include unintentional dictation errors.   Lubertha Rush, MD 02/06/24 [phone]

## 2024-02-06 NOTE — ED Notes (Signed)
 Pt dressed out into burgundy scrubs by this RN and Electrical engineer. Belongings placed in belongings bag

## 2024-02-06 NOTE — ED Notes (Signed)
 Pt ambulated to and from bathroom, no assistance required.

## 2024-02-06 NOTE — Consult Note (Signed)
 [facility] Psychiatric Consult Initial  Patient Name: .Randall Parsons  MRN: [id number]  DOB: 1981/03/16  Consult Order details:  Orders (From admission, onward)     Start     Ordered   02/06/24 0748  CONSULT TO CALL ACT TEAM       Ordering Provider: Lubertha Rush, MD  Provider:  (Not yet assigned)  Question:  Reason for Consult?  Answer:  Psych consult   02/06/24 0747   02/06/24 0748  IP CONSULT TO PSYCHIATRY       Ordering Provider: Lubertha Rush, MD  Provider:  (Not yet assigned)  Question Answer Comment  Place call to: [phone]   Reason for Consult Admit      02/06/24 0747             Mode of Visit: Tele-visit Virtual Statement:TELE PSYCHIATRY ATTESTATION & CONSENT As the provider for this telehealth consult, I attest that I verified the patient's identity using two separate identifiers, introduced myself to the patient, provided my credentials, disclosed my location, and performed this encounter via a HIPAA-compliant, real-time, face-to-face, two-way, interactive audio and video platform and with the full consent and agreement of the patient (or guardian as applicable.) Patient physical location: [facility]. Telehealth provider physical location: home office in state of [place] .   Video start time:   Video end time:      Psychiatry Consult Evaluation  Service Date: Feb 06, 2024 LOS:  LOS: 0 days  Chief Complaint Extreme Anxiety, SI  Primary Psychiatric Diagnoses  Depression with psychotic features 2.  Anxiety   Assessment  Randall Parsons is a 43 y.o. male admitted: Presented to the EDfor 02/06/2024  7:25 AM for Extreme Anxiety. He carries the psychiatric diagnoses of Depression  and has a past medical history of  substance use disorder (crack cocaine)prior suicide attempt, and medication non-adherence.  His current presentation of paranoia, hallucinations, is most consistent with an exacerbation of Schizoaffective Disorder, likely  worsened by medication noncompliance and recent psychosocial stressors. He meets criteria for inpatient psychiatric admission based on active suicidal ideation with plan. Current outpatient psychotropic medications include none currently, as he self-discontinued Zyprexa . Historically, he has had a positive response to Wellbutrin  and Lamictal . He was non-compliant with medications prior to admission, as evidenced by self-discontinuation and inconsistent follow-up with outpatient services.  On initial examination, patient appears internally preoccupied, anxious, and guarded. Inconsistencies in history may reflect disorganized thinking, but malingering for secondary gain (shelter) cannot be ruled out. Diagnoses:  Active Hospital problems: Active Problems:   * No active hospital problems. *    Plan   ## Psychiatric Medication Recommendations:  Latuda  ## Medical Decision Making Capacity: Not specifically addressed in this encounter  ## Disposition:-- We recommend inpatient psychiatric hospitalization when medically cleared. Patient is under voluntary admission status at this time; please IVC if attempts to leave hospital.  ## Behavioral / Environmental: - No specific recommendations at this time.     ## Safety and Observation Level:  - Based on my clinical evaluation, I estimate the patient to be at low risk of self harm in the current setting. - At this time, we recommend  routine. This decision is based on my review of the chart including patient's history and current presentation, interview of the patient, mental status examination, and consideration of suicide risk including evaluating suicidal ideation, plan, intent, suicidal or self-harm behaviors, risk factors, and protective factors. This judgment is based on our ability  to directly address suicide risk, implement suicide prevention strategies, and develop a safety plan while the patient is in the clinical setting. Please contact our team  if there is a concern that risk level has changed.  CSSR Risk Category:C-SSRS RISK CATEGORY: High Risk  Suicide Risk Assessment: Patient has following modifiable risk factors for suicide: active suicidal ideation, which we are addressing by inpatient recommendation. Patient has following non-modifiable or demographic risk factors for suicide: male gender and history of suicide attempt Patient has the following protective factors against suicide: Supportive friends and Cultural, spiritual, or religious beliefs that discourage suicide  Thank you for this consult request. Recommendations have been communicated to the primary team.  We will recommend inpatient  at this time.   Randall Parsons       History of Present Illness  Relevant Aspects of Hospital ED Course:  Admitted on 02/06/2024 for Anxiety.   Patient Report:  Randall Parsons is a 43 year old male who presented to the ED endorsing extreme anxiety, active suicidal ideation, auditory hallucinations, and paranoid delusions. He reports that he was moments away from using a blade to "end it all" and states that during drug use, he hoped the substances would cause fatal cardiac arrest. He describes hearing one voice that can "hear all [his] thoughts" and believes the voice is running surveillance on him. He also believes people are trying to hurt him, contributing to his suicidal thoughts.  Randall Parsons further reports visual hallucinations, including seeing a "plaque in the sky with evil faces." He notes that the voices he hears now resemble those of people from the mission shelter, although earlier he claimed the voice was unfamiliar. His statements are inconsistent.  He stopped taking Zyprexa  after one week due to side effects, describing a sensation of his muscles being "stretched." He reports that Wellbutrin  and Lamictal  were previously effective. Past psychiatric history includes 5-6 inpatient stays, outpatient therapy for 8 months  (discontinued after relapse to crack cocaine), and one prior suicide attempt via wrist cutting. He reports no current substance use for the past 2.5 months. Family history includes bipolar disorder in his mother and alcohol  abuse in his father.  Psych ROS:  Depression: yes Anxiety:  yes Mania (lifetime and current): no Psychosis: (lifetime and current): yes   Review of Systems  Constitutional: Negative.   HENT: Negative.    Eyes: Negative.   Respiratory: Negative.    Cardiovascular: Negative.   Gastrointestinal: Negative.   Genitourinary: Negative.   Musculoskeletal: Negative.   Neurological: Negative.   Psychiatric/Behavioral:  Positive for hallucinations and suicidal ideas. The patient is nervous/anxious.      Psychiatric and Social History  Psychiatric History:  Information collected from the patient  Prev Dx/Sx: Depression Current Psych Provider: none Home Meds (current): none Previous Med Trials: zypexa, lamictial, wellbutrin  Therapy: none  Prior Psych Hospitalization: yes  Prior Self Harm: yes Prior Violence: no  Family Psych History: mother (bipolar), father (alcohol  abuse) Family Hx suicide: unknown  Social History:  Developmental Hx: unknown Educational Hx: unknown Occupational Hx: none Legal Hx: unknown Living Situation: homeless Spiritual Hx: unknown Access to weapons/lethal means: none   Substance History Alcohol : no  Tobacco: 20 years ago Illicit drugs: crack cocaine Prescription drug abuse: none reported Rehab hx: yes  Exam Findings   Vital Signs:  Temp:  [97.8 F (36.6 C)] 97.8 F (36.6 C) (05/02 0645) Pulse Rate:  [84] 84 (05/02 0645) Resp:  [24] 24 (05/02 0645) BP: (126)/(98) 126/98 (05/02 0645)  SpO2:  [100 %] 100 % (05/02 0645) Weight:  [77.1 kg] 77.1 kg (05/02 0645) Blood pressure (!) 126/98, pulse 84, temperature 97.8 F (36.6 C), temperature source Oral, resp. rate (!) 24, height 5\' 5"  (1.651 m), weight 77.1 kg, SpO2 100%. Body  mass index is 28.29 kg/m.  Physical Exam HENT:     Head: Normocephalic.     Nose: Nose normal.     Mouth/Throat:     Pharynx: Oropharynx is clear.  Eyes:     Extraocular Movements: Extraocular movements intact.  Pulmonary:     Effort: Pulmonary effort is normal.  Musculoskeletal:        General: Normal range of motion.     Cervical back: Normal range of motion.  Skin:    General: Skin is dry.  Neurological:     Mental Status: He is alert.     Other History   These have been pulled in through the EMR, reviewed, and updated if appropriate.  Family History:  The patient's family history includes Alcohol  abuse in his father; Breast cancer in his mother; Melanoma in his mother.  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  Current Outpatient Medications:    clindamycin  (CLEOCIN ) 150 MG capsule, Take 2 capsules (300 mg total) by mouth 3 (three) times daily., Disp: 42 capsule, Rfl: 0   cyclobenzaprine  (FLEXERIL ) 10 MG tablet, TAKE 1 TABLET BY MOUTH THREE TIMES A DAY AS NEEDED FOR MUSCLE SPASMS, Disp: 90 tablet, Rfl: 0   meloxicam  (MOBIC ) 15 MG tablet, Take 1 tablet (15 mg  total) by mouth daily. With food, Disp: 30 tablet, Rfl: 0   omeprazole  (PRILOSEC) 20 MG capsule, Take 1 capsule (20 mg total) by mouth daily., Disp: 30 capsule, Rfl: 3   oxyCODONE -acetaminophen  (PERCOCET) 5-325 MG tablet, Take 1 tablet by mouth every 4 (four) hours as needed for severe pain (pain score 7-10)., Disp: 8 tablet, Rfl: 0  Allergies: Allergies  Allergen Reactions   Amoxicillin Hives and Other (See Comments)    Did it involve swelling of the face/tongue/throat, SOB, or low BP? No Did it involve sudden or severe rash/hives, skin peeling, or any reaction on the inside of your mouth or nose? Yes Did you need to seek medical attention at a hospital or doctor's office? Yes When did it last happen?     43 yrs old If all above answers are "NO", may proceed with cephalosporin use.    Fish Allergy Hives   Prednisone  Other (See  Comments)    irritable    Kortne All, Randall Parsons

## 2024-02-06 NOTE — ED Notes (Signed)
Hospital meal provided.  100% consumed, pt tolerated w/o complaints.  

## 2024-02-06 NOTE — Progress Notes (Signed)
   02/06/24 1145  Spiritual Encounters  Type of Visit Initial  Care provided to: Patient  Conversation partners present during encounter Nurse  Reason for visit Routine spiritual support  OnCall Visit No   Chaplain visited patient while rounding on the Unit.  Patient shared painful life experiences and testimonies from his faith.  He shared his conversion story and how much he loves God.    Rev. Rana M. Davis. M.Div. Chaplain Resident [facility]

## 2024-02-06 NOTE — BH Assessment (Signed)
 Comprehensive Clinical Assessment (CCA) Screening, Triage and Referral Note  02/06/2024 Randall Parsons [id number]  Randall Parsons, 43 year old male who presents to [facility] ED voluntarily for treatment. Per triage note, Patient C/O SI and "extreme anxiety" that began this morning. He does have a history of an SI attempt in the past. Of note, the patient abruptly quit taking his Zyprexa  two days ago because he said he didn't like the way it made him feel. He states that now he feels worse. Patient currently resides at a shelter.   During TTS assessment pt presents alert and oriented x 4, restless but cooperative, and mood-congruent with affect. The pt does not appear to be responding to internal or external stimuli. Neither is the pt presenting with any delusional thinking. Pt verified the information provided to triage RN.   Pt identifies his main complaint to be that he is hearing voices and having suicidal thoughts. Patient presents endorsing paranoia stating a group of people have been watching him and ultimately followed him to the ED. Patient reports this has caused his anxiety to worsen so much so that he left the shelter where he was staying. Patient reports he is unemployed and has not been able to work because of his fear of being hurt by this group of people. "I am scared to do anything." Patient states he stopped taking his medication, Zyprexa  only after using it for a week because he did not like the way it made him feel. "My muscles feel like they are being stretched." Pt admits to using crack/cocaine; however, he reports he has been sober for 2  months. Pt reports INPT hx at [facility]. Pt reports family hx of MH. Pt denies HI. Pt is unable to contract for safety. Patient reports he thought about cutting his wrist with his pocketknife; however, he prayed knowing "God does not want that."    Per Shelvy Dickens, NP, pt is recommended for inpatient psychiatric admission.   Chief Complaint:  Chief  Complaint  Patient presents with   Suicidal   Visit Diagnosis: Depression with psychotic features  Patient Reported Information How did you hear about us ? No data recorded What Is the Reason for Your Visit/Call Today? Patient came to the ED for severe anxiety and SI.  How Long Has This Been Causing You Problems? 1-6 months  What Do You Feel Would Help You the Most Today? Treatment for Depression or other mood problem; Medication(s)   Have You Recently Had Any Thoughts About Hurting Yourself? Yes  Are You Planning to Commit Suicide/Harm Yourself At This time? No   Have you Recently Had Thoughts About Hurting Someone Marigene Shoulder? No  Are You Planning to Harm Someone at This Time? No  Explanation: No data recorded  Have You Used Any Alcohol  or Drugs in the Past 24 Hours? No  How Long Ago Did You Use Drugs or Alcohol ? No data recorded What Did You Use and How Much? No data recorded  Do You Currently Have a Therapist/Psychiatrist? No data recorded Name of Therapist/Psychiatrist: No data recorded  Have You Been Recently Discharged From Any Office Practice or Programs? No  Explanation of Discharge From Practice/Program: No data recorded   CCA Screening Triage Referral Assessment Type of Contact: Face-to-Face  Telemedicine Service Delivery:   Is this Initial or Reassessment?   Date Telepsych consult ordered in [facility]:    Time Telepsych consult ordered in [facility]:    Location of Assessment: [facility] ED  Provider Location: [facility] ED  Collateral Involvement: None provided   Does Patient Have a Automotive engineer Guardian? No data recorded Name and Contact of Legal Guardian: No data recorded If Minor and Not Living with Parent(s), Who has Custody? No data recorded Is CPS involved or ever been involved? No data recorded Is APS involved or ever been involved? No data recorded  Patient Determined To Be At Risk for Harm To Self or Others Based on Review of Patient Reported Information or  Presenting Complaint? Yes, for Self-Harm  Method: Plan without intent  Availability of Means: No access or NA  Intent: Vague intent or NA  Notification Required: No need or identified person  Additional Information for Danger to Others Potential: No data recorded Additional Comments for Danger to Others Potential: No data recorded Are There Guns or Other Weapons in Your Home? No  Types of Guns/Weapons: No data recorded Are These Weapons Safely Secured?                            No data recorded Who Could Verify You Are Able To Have These Secured: No data recorded Do You Have any Outstanding Charges, Pending Court Dates, Parole/Probation? No data recorded Contacted To Inform of Risk of Harm To Self or Others: No data recorded  Does Patient Present under Involuntary Commitment? No    [place] of Residence: [place]   Patient Currently Receiving the Following Services: Medication Management   Determination of Need: Emergent (2 hours)   Options For Referral: ED Visit; Inpatient Hospitalization; Medication Management   Disposition Recommendation per psychiatric provider: We recommend inpatient psychiatric hospitalization when medically cleared. Patient is under voluntary admission status at this time; please IVC if attempts to leave hospital.  Verlena Glenn, Counselor, LCAS-A

## 2024-02-06 NOTE — ED Notes (Signed)
 Pt appears in a panic.  Pt is shaking and hyperventilating.  He states that "there person that it after me and wants to smash my face in is here in a green EMS shirt".   I attempted to calm pt and did check but did not see any person matching that description.  Medicated pt per MD order.

## 2024-02-07 NOTE — ED Notes (Signed)
 Pt taking shower. Pt was given hygiene items and the following, 1 clean top, 1 clean bottom, with 1 pair of disposable underwear.  Pt changed out into clean clothing.  Staff disposed of all shower supplies.

## 2024-02-07 NOTE — BH Assessment (Signed)
 Call received intake RN with [address] Ruthann Cover).  Patient has been accepted for admission. Admitting MD:  Dr. Bedelia Bowen Call report:  [phone] Patient can arrive after 8 am tomorrow.   Consent from [place] to be faxed and signed.

## 2024-02-07 NOTE — ED Notes (Signed)
 Report called to Claudia Cuff at [facility]

## 2024-02-07 NOTE — ED Provider Notes (Signed)
 Emergency Medicine Observation Re-evaluation Note  Randall Parsons is a 43 y.o. male, seen on rounds today.  Pt initially presented to the ED for complaints of Suicidal  Currently, the patient is is no acute distress. Denies any concerns at this time.  Physical Exam  Blood pressure 129/82, pulse 88, temperature 97.7 F (36.5 C), resp. rate 17, height 5\' 5"  (1.651 m), weight 77.1 kg, SpO2 97%.  Physical Exam: General: No apparent distress Pulm: Normal WOB Neuro: Moving all extremities Psych: Resting comfortably     ED Course / MDM     I have reviewed the labs performed to date as well as medications administered while in observation.  Recent changes in the last 24 hours include: No acute events overnight.  Plan   Current plan: Patient accepted to inpatient psychiatry.  Stable at transfer. Patient is not under full IVC at this time.    Viviano Ground, MD 02/07/24 [phone]

## 2024-02-07 NOTE — ED Notes (Signed)
 EMTALA reviewed by charge RN

## 2024-02-07 NOTE — ED Provider Notes (Signed)
 Emergency Medicine Observation Re-evaluation Note  Randall Parsons is a 43 y.o. male, seen on rounds today.  Pt initially presented to the ED for complaints of Suicidal Currently, the patient is resting, voices no medical complaints.  Physical Exam  BP 116/69 (BP Location: Right Arm)   Pulse 82   Temp 97.9 F (36.6 C) (Oral)   Resp 18   Ht 5\' 5"  (1.651 m)   Wt 77.1 kg   SpO2 97%   BMI 28.29 kg/m  Physical Exam General: Resting in no acute distress Cardiac: No cyanosis Lungs: Equal rise and fall Psych: Not agitated  ED Course / MDM  EKG:   I have reviewed the labs performed to date as well as medications administered while in observation.  Recent changes in the last 24 hours include no events overnight.  Plan  Current plan is for psychiatric disposition.    Nansi Birmingham J, MD 02/07/24 0600

## 2024-02-07 NOTE — ED Notes (Signed)
 Patient signed voluntary consent form sent by [facility].  Signed form faxed back to [facility].

## 2024-03-04 ENCOUNTER — Telehealth: Payer: Self-pay

## 2024-03-04 NOTE — Transitions of Care (Post Inpatient/ED Visit) (Unsigned)
 Per ED note pt was admitted for Suicidal Ideation at [facility] Inpatient psychiatry unit. Sending note to Dr Vallarie Gauze.      03/04/2024  Name: Randall Parsons MRN: [id number] DOB: 05-16-1981  Today's TOC FU Call Status: Today's TOC FU Call Status::  (pt was admitted [facility] inpatient psychaitric unit)  Attempted to reach the patient regarding the most recent Inpatient/ED visit.  Follow Up Plan: No further outreach attempts will be made at this time. We have been unable to contact the patient.  Signature Claretha Crocker, LPN

## 2024-03-05 NOTE — Telephone Encounter (Signed)
 Noted.  I will await report from inpatient unit.

## 2024-07-06 ENCOUNTER — Telehealth: Payer: Self-pay

## 2024-07-06 NOTE — Transitions of Care (Post Inpatient/ED Visit) (Unsigned)
   07/06/2024  Name: Randall Parsons MRN: [id number] DOB: 19-May-1981  Today's TOC FU Call Status: Today's TOC FU Call Status:: Unsuccessful Call (1st Attempt) Unsuccessful Call (1st Attempt) Date: 07/06/24  Attempted to reach the patient regarding the most recent Inpatient/ED visit.  Follow Up Plan: Additional outreach attempts will be made to reach the patient to complete the Transitions of Care (Post Inpatient/ED visit) call.   Signature  Avelina Essex, CMA (AAMA)  [facility]- [facility] [phone]

## 2024-07-24 ENCOUNTER — Ambulatory Visit (HOSPITAL_COMMUNITY)
Admission: EM | Admit: 2024-07-24 | Discharge: 2024-07-25 | Disposition: A | Discharge status: Other Acute Inpatient Hospital | Payer: MEDICAID

## 2024-07-24 DIAGNOSIS — R45851 Suicidal ideations: Secondary | ICD-10-CM | POA: Insufficient documentation

## 2024-07-24 DIAGNOSIS — T424X1S Poisoning by benzodiazepines, accidental (unintentional), sequela: Secondary | ICD-10-CM | POA: Insufficient documentation

## 2024-07-24 DIAGNOSIS — F323 Major depressive disorder, single episode, severe with psychotic features: Secondary | ICD-10-CM

## 2024-07-24 DIAGNOSIS — Z79899 Other long term (current) drug therapy: Secondary | ICD-10-CM | POA: Insufficient documentation

## 2024-07-24 DIAGNOSIS — F1410 Cocaine abuse, uncomplicated: Secondary | ICD-10-CM | POA: Insufficient documentation

## 2024-07-24 DIAGNOSIS — T40411S Poisoning by fentanyl or fentanyl analogs, accidental (unintentional), sequela: Secondary | ICD-10-CM | POA: Insufficient documentation

## 2024-07-24 DIAGNOSIS — F411 Generalized anxiety disorder: Secondary | ICD-10-CM | POA: Insufficient documentation

## 2024-07-24 DIAGNOSIS — T402X1S Poisoning by other opioids, accidental (unintentional), sequela: Secondary | ICD-10-CM | POA: Insufficient documentation

## 2024-07-24 DIAGNOSIS — Z5902 Unsheltered homelessness: Secondary | ICD-10-CM | POA: Insufficient documentation

## 2024-07-24 DIAGNOSIS — G931 Anoxic brain damage, not elsewhere classified: Secondary | ICD-10-CM | POA: Insufficient documentation

## 2024-07-24 DIAGNOSIS — F333 Major depressive disorder, recurrent, severe with psychotic symptoms: Secondary | ICD-10-CM | POA: Insufficient documentation

## 2024-07-24 LAB — CBC WITH DIFFERENTIAL/PLATELET
Abs Immature Granulocytes: 0.02 K/uL (ref 0.00–0.07)
Basophils Absolute: 0 K/uL (ref 0.0–0.1)
Basophils Relative: 1 %
Eosinophils Absolute: 0.2 K/uL (ref 0.0–0.5)
Eosinophils Relative: 4 %
HCT: 43.9 % (ref 39.0–52.0)
Hemoglobin: 14.2 g/dL (ref 13.0–17.0)
Immature Granulocytes: 0 %
Lymphocytes Relative: 39 %
Lymphs Abs: 2.4 K/uL (ref 0.7–4.0)
MCH: 30.2 pg (ref 26.0–34.0)
MCHC: 32.3 g/dL (ref 30.0–36.0)
MCV: 93.4 fL (ref 80.0–100.0)
Monocytes Absolute: 0.6 K/uL (ref 0.1–1.0)
Monocytes Relative: 10 %
Neutro Abs: 2.9 K/uL (ref 1.7–7.7)
Neutrophils Relative %: 46 %
Platelets: 360 K/uL (ref 150–400)
RBC: 4.7 MIL/uL (ref 4.22–5.81)
RDW: 13.7 % (ref 11.5–15.5)
WBC: 6.2 K/uL (ref 4.0–10.5)
nRBC: 0 % (ref 0.0–0.2)

## 2024-07-24 LAB — COMPREHENSIVE METABOLIC PANEL WITH GFR
ALT: 24 U/L (ref 0–44)
AST: 22 U/L (ref 15–41)
Albumin: 4.3 g/dL (ref 3.5–5.0)
Alkaline Phosphatase: 79 U/L (ref 38–126)
Anion gap: 14 (ref 5–15)
BUN: 6 mg/dL (ref 6–20)
CO2: 26 mmol/L (ref 22–32)
Calcium: 9.4 mg/dL (ref 8.9–10.3)
Chloride: 99 mmol/L (ref 98–111)
Creatinine, Ser: 1.09 mg/dL (ref 0.61–1.24)
GFR, Estimated: >60 mL/min (ref >60)
Glucose, Bld: 104 mg/dL — ABNORMAL HIGH (ref 70–99)
Potassium: 3 mmol/L — ABNORMAL LOW (ref 3.5–5.1)
Sodium: 139 mmol/L (ref 135–145)
Total Bilirubin: 0.7 mg/dL (ref 0.0–1.2)
Total Protein: 7.8 g/dL (ref 6.5–8.1)

## 2024-07-24 LAB — MAGNESIUM: Magnesium: 2.2 mg/dL (ref 1.7–2.4)

## 2024-07-24 LAB — ETHANOL: Alcohol, Ethyl (B): <15 mg/dL (ref <15)

## 2024-07-24 MED ORDER — HALOPERIDOL 5 MG PO TABS: 5.0000 mg | ORAL_TABLET | Freq: Three times a day (TID) | ORAL | Status: DC | PRN

## 2024-07-24 MED ORDER — HALOPERIDOL LACTATE 5 MG/ML IJ SOLN: 10.0000 mg | Freq: Three times a day (TID) | INTRAMUSCULAR | Status: DC | PRN

## 2024-07-24 MED ORDER — DIPHENHYDRAMINE HCL 50 MG PO CAPS: 50.0000 mg | ORAL_CAPSULE | Freq: Four times a day (QID) | ORAL | Status: DC | PRN

## 2024-07-24 MED ORDER — NICOTINE 21 MG/24HR TD PT24: 21.0000 mg | MEDICATED_PATCH | Freq: Every day | TRANSDERMAL | Status: DC

## 2024-07-24 MED ORDER — MAGNESIUM HYDROXIDE 400 MG/5ML PO SUSP: 30.0000 mL | Freq: Every day | ORAL | Status: DC | PRN

## 2024-07-24 MED ORDER — ALUM & MAG HYDROXIDE-SIMETH 200-200-20 MG/5ML PO SUSP: 30.0000 mL | ORAL | Status: DC | PRN

## 2024-07-24 MED ORDER — ACETAMINOPHEN 325 MG PO TABS: 650.0000 mg | ORAL_TABLET | Freq: Four times a day (QID) | ORAL | Status: DC | PRN

## 2024-07-24 MED ORDER — DIPHENHYDRAMINE HCL 50 MG PO CAPS: 50.0000 mg | ORAL_CAPSULE | Freq: Three times a day (TID) | ORAL | Status: DC | PRN

## 2024-07-24 MED ORDER — HALOPERIDOL LACTATE 5 MG/ML IJ SOLN: 5.0000 mg | Freq: Three times a day (TID) | INTRAMUSCULAR | Status: DC | PRN

## 2024-07-24 MED ORDER — TRAZODONE HCL 50 MG PO TABS: 50.0000 mg | ORAL_TABLET | Freq: Every evening | ORAL | Status: DC | PRN

## 2024-07-24 MED ORDER — DIPHENHYDRAMINE HCL 50 MG/ML IJ SOLN: 50.0000 mg | Freq: Three times a day (TID) | INTRAMUSCULAR | Status: DC | PRN

## 2024-07-24 MED ORDER — LORAZEPAM 2 MG/ML IJ SOLN: 2.0000 mg | Freq: Three times a day (TID) | INTRAMUSCULAR | Status: DC | PRN

## 2024-07-24 MED ORDER — HYDROXYZINE HCL 25 MG PO TABS: 25.0000 mg | ORAL_TABLET | Freq: Three times a day (TID) | ORAL | Status: DC | PRN

## 2024-07-24 NOTE — ED Notes (Signed)
 Pt is 43 y.o. presents to [facility] as a voluntary walk-in, accompanied by [employer] with complaint of SI, with a plan to walk into traffic and AH. Pt reports hearing voices earlier this evening telling him to kill himself. Pt states it's my voice that I hear, but I know that's not me. Pt currently denies HI,VH and substance/alcohol  use. Pt is alert and oriented x 4, Skin assessment completed, provided with food and drink. Patient now is in bed appears asleep. Will continue to monitor.

## 2024-07-24 NOTE — Progress Notes (Signed)
   07/24/24 1939  [facility]UC Triage Screening (Walk-ins at [facility] only)  How Did You Hear About Us ? Legal System  What Is the Reason for Your Visit/Call Today? Pt presents to [facility] as a voluntary walk-in, accompanied by [employer] with complaint of SI, with a plan to walk into traffic and AH. Pt reports leaving his residential home ([facility]) because he feared for his life. Pt reports hearing voices earlier this evening telling him to kill himself. Pt states it's my voice that I hear, but I know that's not me. Pt reports residing in his residential home for about 2 weeks. Pt reports diagnosis of MDD and is prescribed Lamictal  and other medication (pt unable to remember what he takes). Pt denies being established with outpatient therapy or psychiatrist at this time. Pt currently denies HI,VH and substance/alcohol  use.  How Long Has This Been Causing You Problems? <Week  Have You Recently Had Any Thoughts About Hurting Yourself? Yes  How long ago did you have thoughts about hurting yourself? currently  Are You Planning to Commit Suicide/Harm Yourself At This time? Yes  Have you Recently Had Thoughts About Hurting Someone Sherral? No  Are You Planning To Harm Someone At This Time? No  Physical Abuse Denies  Verbal Abuse Denies  Sexual Abuse Denies  Exploitation of patient/patient's resources Denies  Self-Neglect Denies  Are you currently experiencing any auditory, visual or other hallucinations? Yes  Please explain the hallucinations you are currently experiencing: hearing voices telling him to kill him himself  Have You Used Any Alcohol  or Drugs in the Past 24 Hours? No  Do you have any current medical co-morbidities that require immediate attention? No  Clinician description of patient physical appearance/behavior: anxious, pleasant  What Do You Feel Would Help You the Most Today? Treatment for Depression or other mood problem  If access to [facility] Urgent Care was not available, would you have  sought care in the Emergency Department? Yes  Determination of Need Emergent (2 hours)  Options For Referral Other: Comment;BH Urgent Care;Outpatient Therapy;Medication Management;Inpatient Hospitalization  Determination of Need filed? Yes
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
## 2024-07-24 NOTE — ED Notes (Signed)
 Writer spoke with Alix Anthony who states she received a call from law enforcement informing her patient is at our facility. Caller states she is legal POA of the patient. Writer informed caller that we would need a hard copy of those forms and a photo ID to scan into the chart and confirm prior to releasing any information. Caller voiced understanding and stated she could not bring the documents until tomorrow as she is going to sleep. Caller also stated she is patient's emergency contact, writer checked and caller had been removed as emergency contact in our system. Caller requested to not speak to the patient. Caller left name and number - Tayt Moyers (ex spouse) [phone].

## 2024-07-24 NOTE — ED Notes (Signed)
 Pt in bed w/eyes closed resting quietly. Respirations equal and unlabored. No signs or symptoms of distress. Routine safety checks maintained/ongoing.

## 2024-07-25 ENCOUNTER — Other Ambulatory Visit (HOSPITAL_COMMUNITY)
Admission: EM | Admit: 2024-07-25 | Discharge: 2024-07-29 | Disposition: A | Discharge status: Home / Self Care | Payer: MEDICAID | Attending: Psychiatry | Admitting: Psychiatry

## 2024-07-25 ENCOUNTER — Other Ambulatory Visit: Payer: Self-pay

## 2024-07-25 DIAGNOSIS — Z5900 Homelessness unspecified: Secondary | ICD-10-CM | POA: Insufficient documentation

## 2024-07-25 DIAGNOSIS — R45851 Suicidal ideations: Secondary | ICD-10-CM | POA: Insufficient documentation

## 2024-07-25 DIAGNOSIS — F333 Major depressive disorder, recurrent, severe with psychotic symptoms: Secondary | ICD-10-CM | POA: Diagnosis present

## 2024-07-25 LAB — POCT URINE DRUG SCREEN - MANUAL ENTRY (I-SCREEN)
POC Amphetamine UR: NOT DETECTED
POC Buprenorphine (BUP): NOT DETECTED
POC Cocaine UR: NOT DETECTED
POC Marijuana UR: NOT DETECTED
POC Methadone UR: NOT DETECTED
POC Methamphetamine UR: NOT DETECTED
POC Morphine: NOT DETECTED
POC Oxazepam (BZO): NOT DETECTED
POC Oxycodone UR: NOT DETECTED
POC Secobarbital (BAR): NOT DETECTED

## 2024-07-25 LAB — TSH: TSH: 4.174 u[IU]/mL (ref 0.350–4.500)

## 2024-07-25 LAB — LIPID PANEL
Cholesterol: 191 mg/dL (ref 0–200)
HDL: 47 mg/dL (ref >40)
LDL Cholesterol: 130 mg/dL — ABNORMAL HIGH (ref 0–99)
Total CHOL/HDL Ratio: 4.1 ratio
Triglycerides: 70 mg/dL (ref <150)
VLDL: 14 mg/dL (ref 0–40)

## 2024-07-25 MED ORDER — DIPHENHYDRAMINE HCL 50 MG/ML IJ SOLN: 50.0000 mg | Freq: Three times a day (TID) | INTRAMUSCULAR | Status: DC | PRN

## 2024-07-25 MED ORDER — HYDROXYZINE HCL 25 MG PO TABS
25.0000 mg | ORAL_TABLET | Freq: Three times a day (TID) | ORAL | Status: DC | PRN
  Administered 2024-07-26 – 2024-07-28 (×5): 25 mg via ORAL
  Filled 2024-07-25 (×4): qty 1
  Filled 2024-07-25: qty 10
  Filled 2024-07-25: qty 1

## 2024-07-25 MED ORDER — HALOPERIDOL LACTATE 5 MG/ML IJ SOLN: 5.0000 mg | Freq: Three times a day (TID) | INTRAMUSCULAR | Status: DC | PRN

## 2024-07-25 MED ORDER — HALOPERIDOL 5 MG PO TABS: 5.0000 mg | ORAL_TABLET | Freq: Three times a day (TID) | ORAL | Status: DC | PRN

## 2024-07-25 MED ORDER — POTASSIUM CHLORIDE CRYS ER 20 MEQ PO TBCR
20.0000 meq | EXTENDED_RELEASE_TABLET | Freq: Once | ORAL | Status: AC
  Administered 2024-07-25: 20 meq via ORAL
  Filled 2024-07-25: qty 1

## 2024-07-25 MED ORDER — BUPROPION HCL ER (XL) 150 MG PO TB24: 150.0000 mg | ORAL_TABLET | Freq: Once | ORAL | Status: DC

## 2024-07-25 MED ORDER — LORAZEPAM 2 MG/ML IJ SOLN: 2.0000 mg | Freq: Three times a day (TID) | INTRAMUSCULAR | Status: DC | PRN

## 2024-07-25 MED ORDER — DIPHENHYDRAMINE HCL 50 MG PO CAPS: 50.0000 mg | ORAL_CAPSULE | Freq: Three times a day (TID) | ORAL | Status: DC | PRN

## 2024-07-25 MED ORDER — DIPHENHYDRAMINE HCL 50 MG PO CAPS: 50.0000 mg | ORAL_CAPSULE | Freq: Four times a day (QID) | ORAL | Status: DC | PRN

## 2024-07-25 MED ORDER — HALOPERIDOL LACTATE 5 MG/ML IJ SOLN: 10.0000 mg | Freq: Three times a day (TID) | INTRAMUSCULAR | Status: DC | PRN

## 2024-07-25 MED ORDER — LAMOTRIGINE 100 MG PO TABS
200.0000 mg | ORAL_TABLET | Freq: Every day | ORAL | Status: DC
  Administered 2024-07-25 – 2024-07-29 (×5): 200 mg via ORAL
  Filled 2024-07-25: qty 14
  Filled 2024-07-25 (×5): qty 2

## 2024-07-25 MED ORDER — ALUM & MAG HYDROXIDE-SIMETH 200-200-20 MG/5ML PO SUSP: 30.0000 mL | ORAL | Status: DC | PRN

## 2024-07-25 MED ORDER — LURASIDONE HCL 40 MG PO TABS
40.0000 mg | ORAL_TABLET | Freq: Every evening | ORAL | Status: DC
  Administered 2024-07-25 – 2024-07-28 (×4): 40 mg via ORAL
  Filled 2024-07-25: qty 1
  Filled 2024-07-25: qty 7
  Filled 2024-07-25 (×3): qty 1

## 2024-07-25 MED ORDER — MAGNESIUM HYDROXIDE 400 MG/5ML PO SUSP
30.0000 mL | Freq: Every day | ORAL | Status: DC | PRN
  Administered 2024-07-26: 30 mL via ORAL
  Filled 2024-07-25 (×2): qty 30

## 2024-07-25 MED ORDER — NICOTINE 21 MG/24HR TD PT24
21.0000 mg | MEDICATED_PATCH | Freq: Every day | TRANSDERMAL | Status: DC
  Filled 2024-07-25 (×4): qty 1

## 2024-07-25 MED ORDER — ACETAMINOPHEN 325 MG PO TABS
650.0000 mg | ORAL_TABLET | Freq: Four times a day (QID) | ORAL | Status: DC | PRN
  Administered 2024-07-26 – 2024-07-29 (×5): 650 mg via ORAL
  Filled 2024-07-25 (×7): qty 2

## 2024-07-25 MED ORDER — PHENYLEPHRINE IN HARD FAT 0.25 % RE SUPP
1.0000 | Freq: Two times a day (BID) | RECTAL | Status: AC
  Administered 2024-07-26 – 2024-07-28 (×6): 1 via RECTAL
  Filled 2024-07-25 (×4): qty 1
  Filled 2024-07-25: qty 2
  Filled 2024-07-25 (×8): qty 1

## 2024-07-25 MED ORDER — TRAZODONE HCL 50 MG PO TABS
50.0000 mg | ORAL_TABLET | Freq: Every evening | ORAL | Status: DC | PRN
  Administered 2024-07-25 – 2024-07-28 (×5): 50 mg via ORAL
  Filled 2024-07-25 (×2): qty 1
  Filled 2024-07-25: qty 7
  Filled 2024-07-25 (×3): qty 1

## 2024-07-25 NOTE — Group Note (Signed)
 Group Topic: Communication  Group Date: 07/25/2024 Start Time: 1000 End Time: 1030 Facilitators: Yevette Carlyon POUR, LPN  Department: [facility]  Number of Participants: 6  Group Focus: communication and individual meeting Treatment Modality:  Patient-Centered Therapy Interventions utilized were exploration, orientation, and patient education Purpose: express feelings, improve communication skills, and increase insight  Name: Randall Parsons Date of Birth: 11-30-1980  MR: [id number]    Level of Participation: minimal Quality of Participation: cooperative Interactions with others: gave feedback Mood/Affect: appropriate Triggers (if applicable):   Cognition: logical Progress: Minimal Response:   Plan: patient will be encouraged to follow treatment plans established by care team  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) 12/18/2021   Left wrist pain 08/27/2021   Bronchospasm 02/18/2021   COVID-19 vaccine dose declined 12/20/2020   Hepatitis C test positive 12/20/2020   Prolactin increased 12/20/2020   Advance care planning 12/20/2020   GAD (generalized anxiety disorder) 04/04/2020   Attention deficit hyperactivity disorder, combined type 04/04/2020   Opioid type dependence, continuous (HCC) 04/04/2020   Major depressive disorder 07/14/2019   Alcohol  abuse 07/14/2019   Cocaine abuse (HCC) 07/12/2019   Low back pain 11/28/2017    NICM (nonischemic cardiomyopathy) (HCC) 11/28/2017   Renal stone 11/28/2017   History of seizure 03/14/2017   IVDU (intravenous drug user) 03/14/2017   Other specified abnormal immunological findings in serum 12/21/2015
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
## 2024-07-25 NOTE — ED Provider Notes (Signed)
 Behavioral Health Progress Note  Date and Time: 07/25/2024 12:40 PM Name: Randall Parsons MRN:  [id number]  Subjective:  I was having suicidal thoughts   Diagnosis:  Final diagnoses:  Severe episode of recurrent major depressive disorder, with psychotic features [facility])   Chart reviewed and discussed with attending psychiatrist, Dr Garvin Gaines.   On admission, pt reports a history of cocaine use, last used approximately eight months ago. He reports using crack cocaine, about 1 gram daily, prior to entering rehab for eight months. He reports alcohol  use several years ago, with no recent use.  The rehab facility connected him to [facility].  [name]. [name]. He denies any legal problems.  [name]; that both parents are deceased, and his only sister is estranged.  [name]. He was informed that this hospitalization is intended to ensure his mental health stabilization and that it will be a very short stay.  Patient has been recommended for inpatient hospitalization for mental health stabilization, and he is in agreement. We discussed the importance of establishing care with a psychiatric provider and therapist during this hospitalization, as frequent hospitalizations do not appear to be a sustainable solution. Pt reported been hospitalized several times in the past for suicidal thoughts. [name] and would like to be connected with appropriate resources.   Pt states he has been having suicidal thoughts off and on for the past 2 weeks. He reports depressive episodes where he will have good days and then have days of being so tired, intrusive suicidal thoughts that he describes as demonic  thoughts inside of suicide but I know they are not real with last occurrence during this evaluation. He reports one suicide attempt approx 20 years ago via cut my wrist. Pt describes feelings of hopelessness and depression, low energy, low motivation, decreased interest in previously enjoyed activities (exercise). Reports vivid, "crazy" dreams since stopping prazosin about two weeks ago; not clearly nightmares.Reports inconsistent adherence to Zyprexa  (olanzapine ); stopped ~1.5 weeks ago. Lamotrigine  (Lamictal ) taken consistently once daily for depression. Has been on Wellbutrin  previously and states this worked well for depression. He states I haven't been the same since stopping Suboxone. Previous dose was 24 mg and stated he was abruptly tapered and discontinued ~4 weeks ago; withdrawal symptoms initially, now over the hump. Endorse cravings today. He continues to endorse suicidal ideation with a plan to walk into traffic. He denies homicidal ideation, intent or plan. He denies VH.   Total Time spent with patient: 20 minutes  Past Psychiatric History: Schizophrenia per chart review ([facility] 03/2024), OUD, MDD, Cocaine abuse Past Medical History:  Diagnosis Date   Anxiety    Asthma    CHF (congestive heart failure) (HCC)    Depression    Exposure to hepatitis B    Exposure to hepatitis C    Hepatitis C    History of kidney stones    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, ending around 2017   Hypertension    IV drug abuse (HCC)    Myocardial infarction [facility])    Renal disorder    kidney stones   Stroke [facility])     Family History: Mother: breast cancer, bone cancer, melanoma.  Family  Psychiatric  History: Mother: bipolar disorder, manic depressive, Father: depression, alcohol  abuse; Grandmother drank bleach and died 2 days later from SI attempt( per chart review)  Social History: Divorced; ex-wife is somewhat supportive. No children. He completed 8th grade. He is  currently unemployed and unhoused.  Sleep: Fair  Appetite:  Good  Current Medications:  Current Facility-Administered Medications  Medication Dose Route Frequency Provider Last Rate Last Admin   acetaminophen  (TYLENOL ) tablet 650 mg  650 mg Oral Q6H PRN Olasunkanmi, Oluwatosin, NP       alum & mag hydroxide-simeth (MAALOX/MYLANTA) 200-200-20 MG/5ML suspension 30 mL  30 mL Oral Q4H PRN Olasunkanmi, Oluwatosin, NP       haloperidol  (HALDOL ) tablet 5 mg  5 mg Oral TID PRN Olasunkanmi, Oluwatosin, NP       And   diphenhydrAMINE  (BENADRYL ) capsule 50 mg  50 mg Oral TID PRN Olasunkanmi, Oluwatosin, NP       diphenhydrAMINE  (BENADRYL ) capsule 50 mg  50 mg Oral Q6H PRN Olasunkanmi, Oluwatosin, NP       haloperidol  lactate (HALDOL ) injection 5 mg  5 mg Intramuscular TID PRN Olasunkanmi, Oluwatosin, NP       And   diphenhydrAMINE  (BENADRYL ) injection 50 mg  50 mg Intramuscular TID PRN Olasunkanmi, Oluwatosin, NP       And   LORazepam  (ATIVAN ) injection 2 mg  2 mg Intramuscular TID PRN Olasunkanmi, Oluwatosin, NP       haloperidol  lactate (HALDOL ) injection 10 mg  10 mg Intramuscular TID PRN Olasunkanmi, Oluwatosin, NP       And   diphenhydrAMINE  (BENADRYL ) injection 50 mg  50 mg Intramuscular TID PRN Olasunkanmi, Oluwatosin, NP       And   LORazepam  (ATIVAN ) injection 2 mg  2 mg Intramuscular TID PRN Olasunkanmi, Oluwatosin, NP       hydrOXYzine  (ATARAX ) tablet 25 mg  25 mg Oral TID PRN Olasunkanmi, Oluwatosin, NP       lamoTRIgine  (LAMICTAL ) tablet 200 mg  200 mg Oral Daily Olasunkanmi, Oluwatosin, NP   200 mg at 07/25/24 0908   magnesium  hydroxide (MILK OF MAGNESIA) suspension 30 mL  30 mL Oral Daily PRN Olasunkanmi, Oluwatosin, NP       nicotine (NICODERM CQ - dosed in mg/24 hours) patch 21 mg  21 mg Transdermal Q0600 Olasunkanmi, Oluwatosin, NP       potassium chloride  SA (KLOR-CON  M) CR tablet 20 mEq  20 mEq Oral Once Tuwana Kapaun E, NP       traZODone  (DESYREL ) tablet 50 mg  50 mg Oral QHS  PRN Olasunkanmi, Oluwatosin, NP   50 mg at 07/25/24 0126   Current Outpatient Medications  Medication Sig Dispense Refill   lamoTRIgine  (LAMICTAL ) 200 MG tablet Take 200 mg by mouth daily.     mirtazapine (REMERON SOL-TAB) 30 MG disintegrating tablet Take 30 mg by mouth at bedtime.      Labs  Lab Results:  Admission on 07/24/2024, Discharged on 07/25/2024  Component Date Value Ref Range Status   WBC 07/24/2024 6.2  4.0 - 10.5 K/uL Final   RBC 07/24/2024 4.70  4.22 - 5.81 MIL/uL Final   Hemoglobin 07/24/2024 14.2  13.0 - 17.0 g/dL Final   HCT 89/81/7974 43.9  39.0 - 52.0 % Final   MCV 07/24/2024 93.4  80.0 - 100.0 fL Final   MCH 07/24/2024 30.2  26.0 - 34.0 pg Final   MCHC 07/24/2024 32.3  30.0 - 36.0 g/dL Final   RDW 89/81/7974 13.7  11.5 -  15.5 % Final   Platelets 07/24/2024 360  150 - 400 K/uL Final   nRBC 07/24/2024 0.0  0.0 - 0.2 % Final   Neutrophils Relative % 07/24/2024 46  % Final   Neutro Abs 07/24/2024 2.9  1.7 - 7.7 K/uL Final   Lymphocytes Relative 07/24/2024 39  % Final   Lymphs Abs 07/24/2024 2.4  0.7 - 4.0 K/uL Final   Monocytes Relative 07/24/2024 10  % Final   Monocytes Absolute 07/24/2024 0.6  0.1 - 1.0 K/uL Final   Eosinophils Relative 07/24/2024 4  % Final   Eosinophils Absolute 07/24/2024 0.2  0.0 - 0.5 K/uL Final   Basophils Relative 07/24/2024 1  % Final   Basophils Absolute 07/24/2024 0.0  0.0 - 0.1 K/uL Final   Immature Granulocytes 07/24/2024 0  % Final   Abs Immature Granulocytes 07/24/2024 0.02  0.00 - 0.07 K/uL Final   Performed at [facility], [address] [place], [place] [address]   Sodium 07/24/2024 139  135 - 145 mmol/L Final   Potassium 07/24/2024 3.0 (L)  3.5 - 5.1 mmol/L Final   Chloride 07/24/2024 99  98 - 111 mmol/L Final   CO2 07/24/2024 26  22 - 32 mmol/L Final   Glucose, Bld 07/24/2024 104 (H)  70 - 99 mg/dL Final   Glucose reference range applies only to samples taken after fasting for at least 8 hours.   BUN 07/24/2024 6   6 - 20 mg/dL Final   Creatinine, Ser 07/24/2024 1.09  0.61 - 1.24 mg/dL Final   Calcium  07/24/2024 9.4  8.9 - 10.3 mg/dL Final   Total Protein 89/81/7974 7.8  6.5 - 8.1 g/dL Final   Albumin 89/81/7974 4.3  3.5 - 5.0 g/dL Final   AST 89/81/7974 22  15 - 41 U/L Final   ALT 07/24/2024 24  0 - 44 U/L Final   Alkaline Phosphatase 07/24/2024 79  38 - 126 U/L Final   Total Bilirubin 07/24/2024 0.7  0.0 - 1.2 mg/dL Final   GFR, Estimated 07/24/2024 >60  >60 mL/min Final   Comment: (NOTE) Calculated using the CKD-EPI Creatinine Equation (2021)    Anion gap 07/24/2024 14  5 - 15 Final   Performed at [facility], [address], [place], [place] [address]   Magnesium  07/24/2024 2.2  1.7 - 2.4 mg/dL Final   Performed at [facility], [address], [place], [place] [address]   Alcohol , Ethyl (B) 07/24/2024 <15  <15 mg/dL Final   Comment: (NOTE) For medical purposes only. Performed at [facility], [address], [place], [place] [address]    Cholesterol 07/24/2024 191  0 - 200 mg/dL Final   Triglycerides 89/81/7974 70  <150 mg/dL Final   HDL 89/81/7974 47  >40 mg/dL Final   Total CHOL/HDL Ratio 07/24/2024 4.1  RATIO Final   VLDL 07/24/2024 14  0 - 40 mg/dL Final   LDL Cholesterol 07/24/2024 130 (H)  0 - 99 mg/dL Final   Comment:        Total Cholesterol/HDL:CHD Risk Coronary Heart Disease Risk Table                     Men   Women  1/2 Average Risk   3.4   3.3  Average Risk       5.0   4.4  2 X Average Risk   9.6   7.1  3 X Average Risk  23.4  11.0        Use the calculated Patient Ratio above and the CHD Risk Table to determine the patient's CHD Risk.        ATP III CLASSIFICATION (LDL):  <100     mg/dL   Optimal  899-870  mg/dL   Near or Above                    Optimal  130-159  mg/dL   Borderline  839-810  mg/dL   High  >809     mg/dL   Very High Performed at [facility], [address] [place], [place] [address]    POC Amphetamine UR  07/25/2024 None Detected  NONE DETECTED (Cut Off Level 1000 ng/mL) Final   POC Secobarbital (BAR) 07/25/2024 None Detected  NONE DETECTED (Cut Off Level 300 ng/mL) Final   POC Buprenorphine (BUP) 07/25/2024 None Detected  NONE DETECTED (Cut Off Level 10 ng/mL) Final   POC Oxazepam (BZO) 07/25/2024 None Detected  NONE DETECTED (Cut Off Level 300 ng/mL) Final   POC Cocaine UR 07/25/2024 None Detected  NONE DETECTED (Cut Off Level 300 ng/mL) Final   POC Methamphetamine UR 07/25/2024 None Detected  NONE DETECTED (Cut Off Level 1000 ng/mL) Final   POC Morphine  07/25/2024 None Detected  NONE DETECTED (Cut Off Level 300 ng/mL) Final   POC Methadone UR 07/25/2024 None Detected  NONE DETECTED (Cut Off Level 300 ng/mL) Final   POC Oxycodone  UR 07/25/2024 None Detected  NONE DETECTED (Cut Off Level 100 ng/mL) Final   POC Marijuana UR 07/25/2024 None Detected  NONE DETECTED (Cut Off Level 50 ng/mL) Final   TSH 07/24/2024 4.174  0.350 - 4.500 uIU/mL Final   Comment: Performed by a 3rd Generation assay with a functional sensitivity of <=0.01 uIU/mL. Performed at [facility], [address] [place], [place] [address]   Admission on 02/06/2024, Discharged on 02/07/2024  Component Date Value Ref Range Status   Sodium 02/06/2024 137  135 - 145 mmol/L Final   Potassium 02/06/2024 3.8  3.5 - 5.1 mmol/L Final   Chloride 02/06/2024 103  98 - 111 mmol/L Final   CO2 02/06/2024 25  22 - 32 mmol/L Final   Glucose, Bld 02/06/2024 111 (H)  70 - 99 mg/dL Final   Glucose reference range applies only to samples taken after fasting for at least 8 hours.   BUN 02/06/2024 15  6 - 20 mg/dL Final   Creatinine, Ser 02/06/2024 0.89  0.61 - 1.24 mg/dL Final   Calcium  02/06/2024 8.8 (L)  8.9 - 10.3 mg/dL Final   Total Protein 94/97/7974 7.3  6.5 - 8.1 g/dL Final   Albumin 94/97/7974 4.0  3.5 - 5.0 g/dL Final   AST 94/97/7974 29  15 - 41 U/L Final   ALT 02/06/2024 58 (H)  0 - 44 U/L Final   Alkaline Phosphatase  02/06/2024 58  38 - 126 U/L Final   Total Bilirubin 02/06/2024 0.6  0.0 - 1.2 mg/dL Final   GFR, Estimated 02/06/2024 >60  >60 mL/min Final   Comment: (NOTE) Calculated using the CKD-EPI Creatinine Equation (2021)    Anion gap 02/06/2024 9  5 - 15 Final   Performed at [facility], [address] [place], [place] [address]   Alcohol , Ethyl (B) 02/06/2024 <15  <15 mg/dL Final   Comment: Please note change in reference range. (NOTE) For medical purposes only. Performed at [facility], [address]  [place], [place] [address]    WBC 02/06/2024 5.1  4.0 - 10.5 K/uL Final   RBC 02/06/2024 4.91  4.22 - 5.81 MIL/uL Final   Hemoglobin 02/06/2024 14.5  13.0 - 17.0 g/dL Final   HCT 94/97/7974 44.3  39.0 - 52.0 % Final   MCV 02/06/2024 90.2  80.0 - 100.0 fL Final   MCH 02/06/2024 29.5  26.0 - 34.0 pg Final   MCHC 02/06/2024 32.7  30.0 - 36.0 g/dL Final   RDW 94/97/7974 12.8  11.5 - 15.5 % Final   Platelets 02/06/2024 333  150 - 400 K/uL Final   nRBC 02/06/2024 0.0  0.0 - 0.2 % Final   Performed at [facility], [address] [place], [place] [address]   Tricyclic, Ur Screen 02/06/2024 NONE DETECTED  NONE DETECTED Final   Amphetamines, Ur Screen 02/06/2024 NONE DETECTED  NONE DETECTED Final   MDMA (Ecstasy)Ur Screen 02/06/2024 NONE DETECTED  NONE DETECTED Final   Cocaine Metabolite,Ur [place] 02/06/2024 NONE DETECTED  NONE DETECTED Final   Opiate, Ur Screen 02/06/2024 NONE DETECTED  NONE DETECTED Final   Phencyclidine (PCP) Ur S 02/06/2024 NONE DETECTED  NONE DETECTED Final   Cannabinoid 50 Ng, Ur  02/06/2024 NONE DETECTED  NONE DETECTED Final   Barbiturates, Ur Screen 02/06/2024 NONE DETECTED  NONE DETECTED Final   Benzodiazepine, Ur Scrn 02/06/2024 NONE DETECTED  NONE DETECTED Final   Methadone Scn, Ur 02/06/2024 NONE DETECTED  NONE DETECTED Final   Comment: (NOTE) Tricyclics + metabolites, urine    Cutoff 1000 ng/mL Amphetamines + metabolites, urine   Cutoff 1000 ng/mL MDMA (Ecstasy), urine              Cutoff 500 ng/mL Cocaine Metabolite, urine          Cutoff 300 ng/mL Opiate + metabolites, urine        Cutoff 300 ng/mL Phencyclidine (PCP), urine         Cutoff 25 ng/mL Cannabinoid, urine                 Cutoff 50 ng/mL Barbiturates + metabolites, urine  Cutoff 200 ng/mL Benzodiazepine, urine              Cutoff 200 ng/mL Methadone, urine                   Cutoff 300 ng/mL  The urine drug screen provides only a preliminary, unconfirmed analytical test result and should not be used for non-medical purposes. Clinical consideration and professional judgment should be applied to any positive drug screen result due to possible interfering substances. A more specific alternate chemical method must be used in order to obtain a confirmed analytical result. Gas chromatography / mass spectrometry (GC/MS) is the preferred confirm                          atory method. Performed at [facility], [address] [place], [place] [address]    Salicylate Lvl 02/06/2024 <7.0 (L)  7.0 - 30.0 mg/dL Final   Performed at [facility], [address] [place], [place] [address]   Acetaminophen  (Tylenol ), Serum 02/06/2024 <10 (L)  10 - 30 ug/mL Final   Comment: (NOTE) Therapeutic concentrations vary significantly. A range of 10-30 ug/mL  may be an effective concentration for many patients. However, some  are best treated at concentrations outside of this range. Acetaminophen  concentrations >150 ug/mL at 4 hours after ingestion  and >50  ug/mL at 12 hours after ingestion are often associated with  toxic reactions.  Performed at [facility], [address] [place], [place] [address]     Blood Alcohol  level:  Lab Results  Component Value Date   [facility] <15 07/24/2024   [facility] <15 02/06/2024    Metabolic Disorder Labs: Lab Results  Component Value Date   HGBA1C 5.9 (H) 12/17/2021   MPG 123 12/17/2021   MPG 119.76  12/03/2020   Lab Results  Component Value Date   PROLACTIN 8.9 01/04/2021   PROLACTIN 16.2 (H) 12/03/2020   Lab Results  Component Value Date   CHOL 191 07/24/2024   TRIG 70 07/24/2024   HDL 47 07/24/2024   CHOLHDL 4.1 07/24/2024   VLDL 14 07/24/2024   LDLCALC 130 (H) 07/24/2024   LDLCALC 100 (H) 12/17/2021    Therapeutic Lab Levels: No results found for: LITHIUM No results found for: VALPROATE No results found for: CBMZ  Physical Findings   AUDIT    Flowsheet Row Admission (Discharged) from 07/14/2019 in [facility]  Alcohol  Use Disorder Identification Test Final Score (AUDIT) 21   GAD-7    Flowsheet Row Office Visit from 07/22/2023 in [facility] Office Visit from 07/11/2023 in [facility]  Total GAD-7 Score 6 0   PHQ2-9    Flowsheet Row ED from 07/25/2024 in [facility] Office Visit from 07/22/2023 in [facility] [place] Office Visit from 07/11/2023 in [facility] [place] Office Visit from 08/03/2021 in [facility] [place] ED from 12/03/2020 in [facility]  PHQ-2 Total Score 3 0 0 0 4  PHQ-9 Total Score 8 2 0 3 18   Flowsheet Row ED from 07/25/2024 in [facility] ED from 07/24/2024 in [facility] ED from 02/06/2024 in [facility] Emergency Department at [facility]  C-SSRS RISK CATEGORY High Risk High Risk High Risk     Musculoskeletal  Strength & Muscle Tone: within normal limits Gait & Station: normal Patient leans: N/A  Psychiatric Specialty Exam  Presentation  General Appearance:  Appropriate for Environment; Fairly Groomed  Eye Contact: Good  Speech: Clear and Coherent; Normal Rate  Speech Volume: Normal  Handedness: Right   Mood and Affect   Mood: Depressed  Affect: Congruent   Thought Process  Thought Processes: Coherent  Descriptions of Associations:Intact  Orientation:Full (Time, Place and Person)  Thought Content:Perseveration  Diagnosis of Schizophrenia or Schizoaffective disorder in past: No    Hallucinations:Hallucinations: Auditory Description of Auditory Hallucinations: Derogatory words, racial slurs, demonic content  Ideas of Reference:None  Suicidal Thoughts:Suicidal Thoughts: Yes, Active SI Active Intent and/or Plan: With Intent; Without Plan  Homicidal Thoughts:Homicidal Thoughts: No   Sensorium  Memory: Recent Good; Immediate Good  Judgment: Fair  Insight: Fair   Art therapist  Concentration: Fair  Attention Span: Fair  Recall: Good  Fund of Knowledge: Good  Language: Good   Psychomotor Activity  Psychomotor Activity: Psychomotor Activity: Normal   Assets  Assets: Communication Skills; Desire for Improvement; Physical Health; Resilience   Sleep  Sleep: Sleep: Fair  Estimated Sleeping Duration (Last 24 Hours): 0.00 hours  Nutritional Assessment (For OBS and FBC admissions only) Has the patient had a weight loss or gain of 10 pounds or more in the last 3 months?: No Has the patient had a  decrease in food intake/or appetite?: No Does the patient have dental problems?: No Does the patient have eating habits or behaviors that may be indicators of an eating disorder including binging or inducing vomiting?: No Has the patient recently lost weight without trying?: 0 Has the patient been eating poorly because of a decreased appetite?: 0 Malnutrition Screening Tool Score: 0    Physical Exam  Physical Exam Vitals and nursing note reviewed.  HENT:     Head: Normocephalic.     Nose: Nose normal.  Cardiovascular:     Rate and Rhythm: Normal rate.  Pulmonary:     Effort: Pulmonary effort is normal.  Genitourinary:    Comments: Pt endorses  hemorrhoids. Rectal exam deferred.  Musculoskeletal:        General: Normal range of motion.  Skin:    General: Skin is warm and dry.  Neurological:     Mental Status: [name] to person, place, and time.  Psychiatric:     Comments: See HPI    Review of Systems  Constitutional:  Positive for malaise/fatigue. Negative for fever.  HENT:  Negative for congestion and sore throat.   Respiratory:  Negative for cough and shortness of breath.   Cardiovascular:  Negative for chest pain and palpitations.  Psychiatric/Behavioral:  Positive for depression, hallucinations and suicidal ideas. Negative for substance abuse.    Blood pressure 135/75, pulse 84, temperature 97.8 F (36.6 C), resp. rate 18, SpO2 98%. There is no height or weight on file to calculate BMI.  Medical Decision Making Lab Orders         Lipid panel         Hemoglobin A1c         VITAMIN D 25 Hydroxy (Vit-D Deficiency, Fractures)    due to decreased energy  Treatment Plan Summary: Daily contact with patient to assess and evaluate symptoms and progress in treatment  Medication management K+ level 3.0 on 10/18 - received 20 meq replacement. Will give KDur 20 meq PO x 1 more dose. Check labs as noted above. Start Latuda 40 mg PO every evening with food  Start phenylephrine  0.25% PR for hemorrhoids Continue lamictal  200 mg PO daily for mood Continue agitation protocol  Plan  Care coordination to assist with housing and establishment of outpatient mental health resources.   Sherrell Culver, PMHNP-BC, FNP-BC  07/25/2024 12:40 PM

## 2024-07-25 NOTE — ED Notes (Signed)
 Patient alert and oriented.  Denies SI, HI, AVH, and pain. Scheduled medications administered to patient, per MD orders. Support and encouragement provided.  Routine safety checks conducted every 15 min.  Patient informed to notify staff with problems or concerns. No adverse drug reactions noted. Patient contracts for safety at this time. Patient compliant with medications and treatment plan. Patient receptive, calm, and cooperative. Patient interacts well with others on the unit.  Patient remains safe at this time.

## 2024-07-25 NOTE — Group Note (Signed)
 Group Topic: Healthy Self Image and Positive Change  Group Date: 07/25/2024 Start Time: 2000 End Time: 2030 Facilitators: Verdon Jacqualyn BRAVO, NT  Department: [facility]  Number of Participants: 5  Group Focus: daily focus Treatment Modality:  Patient-Centered Therapy Interventions utilized were group exercise Purpose: express feelings  Name: Randall Parsons Date of Birth: January 04, 1981  MR: [id number]    Level of Participation: active Quality of Participation: cooperative Interactions with others: gave feedback Mood/Affect: appropriate Triggers (if applicable): n/a Cognition: coherent/clear Progress: Moderate Response: n/a Plan: follow-up needed  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) 12/18/2021   Left wrist pain 08/27/2021   Bronchospasm 02/18/2021   COVID-19 vaccine dose declined 12/20/2020   Hepatitis C test positive 12/20/2020   Prolactin increased 12/20/2020   Advance care planning 12/20/2020   GAD (generalized anxiety disorder) 04/04/2020   Attention deficit hyperactivity disorder, combined type 04/04/2020   Opioid type dependence, continuous (HCC) 04/04/2020   Major depressive disorder 07/14/2019   Alcohol  abuse 07/14/2019   Cocaine abuse (HCC) 07/12/2019   Low back pain 11/28/2017   NICM (nonischemic cardiomyopathy) (HCC) 11/28/2017   Renal stone 11/28/2017   History of seizure 03/14/2017   IVDU (intravenous drug  user) 03/14/2017   Other specified abnormal immunological findings in serum 12/21/2015

## 2024-07-25 NOTE — ED Notes (Signed)
 New medication order for suppository preparation H has been approved by pharmacy currently waiting for medication from main pharmacy

## 2024-07-25 NOTE — ED Notes (Signed)
 Pt is 43 y.o. presents to [facility] as a voluntary walk-in, accompanied by [employer] with complaint of SI, with a plan to walk into traffic and AH. Pt reports hearing voices earlier this evening telling him to kill himself. Pt states it's my voice that I hear, but I know that's not me. Pt currently denies HI,VH and substance/alcohol  use. Pt is alert and oriented x 4, Skin assessment completed, provided with food and drink. Patient now is in bed appears asleep. Will continue to monitor.

## 2024-07-26 DIAGNOSIS — R45851 Suicidal ideations: Secondary | ICD-10-CM | POA: Diagnosis not present

## 2024-07-26 DIAGNOSIS — F333 Major depressive disorder, recurrent, severe with psychotic symptoms: Secondary | ICD-10-CM | POA: Diagnosis not present

## 2024-07-26 DIAGNOSIS — Z5900 Homelessness unspecified: Secondary | ICD-10-CM | POA: Diagnosis not present

## 2024-07-26 LAB — PROLACTIN: Prolactin: 6.8 ng/mL (ref 3.9–22.7)

## 2024-07-26 NOTE — Group Note (Signed)
 Group Topic: Wellness  Group Date: 07/26/2024 Start Time: 1200 End Time: 1230 Facilitators: Daved Tinnie HERO, RN  Department: [facility]  Number of Participants: 5  Group Focus: nursing group Treatment Modality:  Psychoeducation Interventions utilized were patient education Purpose: increase insight  Name: Randall Parsons Date of Birth: 15-Apr-1981  MR: [id number]    Level of Participation: moderate Quality of Participation: attentive Interactions with others: gave feedback Mood/Affect: appropriate Triggers (if applicable): n/a Cognition: coherent/clear Progress: Gaining insight Response: RN discussed and reviewed pt medication, pt expressed understanding Plan: patient will be encouraged to attend future RN groups and discuss medications with RN and provider  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) 12/18/2021   Left wrist pain 08/27/2021   Bronchospasm 02/18/2021   COVID-19 vaccine dose declined 12/20/2020   Hepatitis C test positive 12/20/2020   Prolactin increased 12/20/2020   Advance care planning 12/20/2020   GAD (generalized anxiety disorder) 04/04/2020   Attention deficit hyperactivity disorder, combined type 04/04/2020   Opioid type dependence, continuous (HCC) 04/04/2020   Major depressive disorder 07/14/2019   Alcohol  abuse 07/14/2019   Cocaine abuse (HCC) 07/12/2019   Low back pain 11/28/2017   NICM (nonischemic  cardiomyopathy) (HCC) 11/28/2017   Renal stone 11/28/2017   History of seizure 03/14/2017   IVDU (intravenous drug user) 03/14/2017   Other specified abnormal immunological findings in serum 12/21/2015

## 2024-07-26 NOTE — ED Notes (Signed)
 Pt sleeping at this time, no acute distress noted. Q15 safety checks in place.

## 2024-07-26 NOTE — ED Notes (Signed)
 Pt observed attending group during this tour.  Presents with a flat affect. Able to answer simple short questions appropriately.  Endorses Suicidal thoughts without a plan.Pt verbalizes agreement not to self harm or act upon thoughts.  Although Pt is preoccupied with ensuring that staff is aware of his SI. Pt told nurse, Everyone is aware that I am having suicidal thoughts, and I just want to know what you all are going to do about it?. Pt denied having a plan, reviewed safety plan with patient to include, group therapy attendance, medication compliance, and implore coping skills.  Pt able to identify coping skills to include, counting, finger tapping, thought diversion and deep breathing skills.  Will continue to provide safe and promote a safe therapeutic milieu .

## 2024-07-26 NOTE — ED Notes (Signed)
 Pt came out of his room with anxious look on face. Pt c/o paranoia. RN encouraged pt to utilize coloring pages/ worksheet and sit in dayroom. Pt declined handouts, says he just wants to sit in dayroom.

## 2024-07-26 NOTE — ED Notes (Signed)
 Pt sleeping at this moment. No acute distress noted. Q15 safety checks in place.

## 2024-07-26 NOTE — ED Notes (Signed)
 Pt administered tylenol  prn for c/o 9/10 Hemorid pain. Pt also reports dark colored stool, denied bleeding. RN educated pt on potential causes of dark colored stool. Pt says  he has been straining, last BM today. RN reached out to provider for stool softener as requested.

## 2024-07-26 NOTE — ED Notes (Signed)
 Pt looking out window, says he feels like someone is following him.

## 2024-07-26 NOTE — ED Notes (Signed)
 Patient verbalizes thoughts and fears of someone may try to hurt him, affect flat., he believed that someone (staff) was  talking about him and planning to harm him. Approached pt unhurriedly and verbally patient was de-escalated.  Staff encouraged patient to utilize his coping skills; to include. Counting, thought distraction, and deep breathing skills.  Patient able to demonstrate the same. Hydroxyzine  25 mg po given to help decrease anxiety and fear.

## 2024-07-26 NOTE — Group Note (Signed)
 Group Topic: Emotional Regulation  Group Date: 07/26/2024 Start Time: 8069 End Time: 2000 Facilitators: Verdon Jacqualyn BRAVO, NT  Department: [facility]  Number of Participants: 6  Group Focus: anger management Treatment Modality:  Individual Therapy Interventions utilized were group exercise Purpose: express feelings  Name: Randall Parsons Date of Birth: 1980/11/11  MR: [id number]    Level of Participation: active Quality of Participation: cooperative Interactions with others: gave feedback Mood/Affect: appropriate Triggers (if applicable): n/a Cognition: coherent/clear Progress: Moderate Response: n/a Plan: follow-up needed  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) 12/18/2021   Left wrist pain 08/27/2021   Bronchospasm 02/18/2021   COVID-19 vaccine dose declined 12/20/2020   Hepatitis C test positive 12/20/2020   Prolactin increased 12/20/2020   Advance care planning 12/20/2020   GAD (generalized anxiety disorder) 04/04/2020   Attention deficit hyperactivity disorder, combined type 04/04/2020   Opioid type dependence, continuous (HCC) 04/04/2020   Major depressive disorder 07/14/2019   Alcohol  abuse 07/14/2019   Cocaine abuse (HCC) 07/12/2019   Low back pain 11/28/2017   NICM (nonischemic cardiomyopathy) (HCC) 11/28/2017   Renal stone 11/28/2017   History of seizure 03/14/2017   IVDU (intravenous drug user) 03/14/2017    Other specified abnormal immunological findings in serum 12/21/2015

## 2024-07-26 NOTE — ED Notes (Signed)
 Pt presents as anxious, pt reports feelings of SI and uncertainty of impulse control, saying he wants to go into traffic or cut his wrists. Pt reported strong thoughts and anxiety, pt administered prn atarax . Pt denies HI. Pt denied physical pain and discomforts. Provider MHT and team made aware of pt inability to contract for safety.

## 2024-07-26 NOTE — ED Notes (Signed)
 Pt reports a small decrease in anxiety level since administration of atarax  this morning, but says he still feels about the same. Pt agreed to second dose sometime after lunch. Pt agreed to notify staff if decision to harm self. Pt provided with new pair of socks as requested.

## 2024-07-26 NOTE — ED Provider Notes (Signed)
 Behavioral Health Progress Note  Date and Time: 07/26/2024 6:35 PM Name: Randall Parsons MRN:  [id number]  Subjective: Per Triage:  Patient  presented to [facility] as a voluntary walk-in, accompanied by [employer] with complaint of SI, with a plan to walk into traffic and AH. He  reported  leaving his residential home ([facility]) because he feared for his life. Patient reported  hearing voices  telling him to kill himself. Patient  stated it's my voice that I hear, but I know that's not me. He reported he was residing in his at that service home  for about 2 weeks. Presented  diagnoses  of MDD and is prescribed Lamictal  and other medication (pt unable to remember what he takes). Patient t denied being established with outpatient therapy or psychiatrist at this time. Patient denied  HI,VH and substance/alcohol  use.  Per nursing, patient complained of Hemorid pain and hard stools   and was medicated. He also complained of ongoing anxiety and was medicated as prescribed.   Patient also met with care coordinator to discuss discharge plan. Patient expressed his desire to go to inpatient, stating that he still feels suicidal with worsening hopelessness. Referrals were made to [facility] but patient was declined due to his hx of TBI.  Collateral info states :Ms Patriarca reported that Pt has been in and out of psych units for 8 months. He would last for a couple days on out patient then back in psych hospital. Patient experience intrusive thoughts since February. Pt has never hurt anyone. Believes he has been self-medicating since Jan or Feb. Ms Passage stated that patient really needs to be in long term residential facility   Face to face encounter today: Provider met with patient in the day room where he was watching TV. Patient appeared worried, sad and depressed. Cooperative throughout the assessment. Alert and oriented x 4. Casually dressed and groomed. He appeared healthy and well  nourished.  Expressed paranoid thoughts stating that I feel like people are there to get me, they don't want me here. States this is the reason he recently left his residential place because they kept looking at me as if they want to get me, and I got scared, and walked out.  He also reports passive suicidal ideations on and off. Reports a hx of suicide attempt 19 years ago.  Patient reports he has not felt okay since prison I think I have trauma from there. States he was in and out of prison for drug problems. He also states his disturbed thought process has something to do with his hx of TBI I am not fully recovered, I haven't been the same person since. Patient rates his depression at 7/10. States the medications are helping slightly and would like no change because I am very sensitive to medication change. Reports that his appetite is fair. Sleep is improving. Patient denies HI/AVH. Reports he is supported by his ex-wife, I have been calling her, but she hasn't called me.   Patient is insisting that he needs inpatient for stabilization, that he needs more time to work on his mood and anxiety. He reports no major concerns with his current medications. Placement need is discussed with case management. While waiting for disposition, we will continue with current treatment. We will continue to provide support and encouragements along with daily assessment.      Diagnosis:  Final diagnoses:  Severe episode of recurrent major depressive disorder, with psychotic features (HCC)  Total Time spent with patient: 45 minutes  Past Psychiatric History: MDD, GAD, PTSD.  Past Medical History: TBI Family History: NA Family Psychiatric  History: NA Social History: Homeless, unemployed, with limited support system  Additional Social History: Reports he has a sister but they don't talk because of my poor decisions, my behaviors.                         Sleep: Fair  Appetite:   Fair  Current Medications:  Current Facility-Administered Medications  Medication Dose Route Frequency Provider Last Rate Last Admin   acetaminophen  (TYLENOL ) tablet 650 mg  650 mg Oral Q6H PRN Olasunkanmi, Oluwatosin, NP   650 mg at 07/26/24 1235   alum & mag hydroxide-simeth (MAALOX/MYLANTA) 200-200-20 MG/5ML suspension 30 mL  30 mL Oral Q4H PRN Olasunkanmi, Oluwatosin, NP       haloperidol  (HALDOL ) tablet 5 mg  5 mg Oral TID PRN Olasunkanmi, Oluwatosin, NP       And   diphenhydrAMINE  (BENADRYL ) capsule 50 mg  50 mg Oral TID PRN Olasunkanmi, Oluwatosin, NP       diphenhydrAMINE  (BENADRYL ) capsule 50 mg  50 mg Oral Q6H PRN Olasunkanmi, Oluwatosin, NP       haloperidol  lactate (HALDOL ) injection 5 mg  5 mg Intramuscular TID PRN Olasunkanmi, Oluwatosin, NP       And   diphenhydrAMINE  (BENADRYL ) injection 50 mg  50 mg Intramuscular TID PRN Olasunkanmi, Oluwatosin, NP       And   LORazepam  (ATIVAN ) injection 2 mg  2 mg Intramuscular TID PRN Olasunkanmi, Oluwatosin, NP       haloperidol  lactate (HALDOL ) injection 10 mg  10 mg Intramuscular TID PRN Olasunkanmi, Oluwatosin, NP       And   diphenhydrAMINE  (BENADRYL ) injection 50 mg  50 mg Intramuscular TID PRN Olasunkanmi, Oluwatosin, NP       And   LORazepam  (ATIVAN ) injection 2 mg  2 mg Intramuscular TID PRN Olasunkanmi, Oluwatosin, NP       hydrOXYzine  (ATARAX ) tablet 25 mg  25 mg Oral TID PRN Bridgette, Oluwatosin, NP   25 mg at 07/26/24 1725   lamoTRIgine  (LAMICTAL ) tablet 200 mg  200 mg Oral Daily Olasunkanmi, Oluwatosin, NP   200 mg at 07/26/24 0916   lurasidone (LATUDA) tablet 40 mg  40 mg Oral QPM Hobson, Fran E, NP   40 mg at 07/26/24 1723   magnesium  hydroxide (MILK OF MAGNESIA) suspension 30 mL  30 mL Oral Daily PRN Olasunkanmi, Oluwatosin, NP   30 mL at 07/26/24 1321   nicotine (NICODERM CQ - dosed in mg/24 hours) patch 21 mg  21 mg Transdermal Q0600 Olasunkanmi, Oluwatosin, NP       phenylephrine  ((USE for PREPARATION-H)) 0.25 %  suppository 1 suppository  1 suppository Rectal BID Hobson, Fran E, NP   1 suppository at 07/26/24 9082   traZODone  (DESYREL ) tablet 50 mg  50 mg Oral QHS PRN Olasunkanmi, Oluwatosin, NP   50 mg at 07/25/24 2120   Current Outpatient Medications  Medication Sig Dispense Refill   lamoTRIgine  (LAMICTAL ) 200 MG tablet Take 200 mg by mouth daily.     mirtazapine (REMERON SOL-TAB) 30 MG disintegrating tablet Take 30 mg by mouth at bedtime.      Labs  Lab Results:  Admission on 07/24/2024, Discharged on 07/25/2024  Component Date Value Ref Range Status   WBC 07/24/2024 6.2  4.0 - 10.5 K/uL Final   RBC 07/24/2024 4.70  4.22 -  5.81 MIL/uL Final   Hemoglobin 07/24/2024 14.2  13.0 - 17.0 g/dL Final   HCT 89/81/7974 43.9  39.0 - 52.0 % Final   MCV 07/24/2024 93.4  80.0 - 100.0 fL Final   MCH 07/24/2024 30.2  26.0 - 34.0 pg Final   MCHC 07/24/2024 32.3  30.0 - 36.0 g/dL Final   RDW 89/81/7974 13.7  11.5 - 15.5 % Final   Platelets 07/24/2024 360  150 - 400 K/uL Final   nRBC 07/24/2024 0.0  0.0 - 0.2 % Final   Neutrophils Relative % 07/24/2024 46  % Final   Neutro Abs 07/24/2024 2.9  1.7 - 7.7 K/uL Final   Lymphocytes Relative 07/24/2024 39  % Final   Lymphs Abs 07/24/2024 2.4  0.7 - 4.0 K/uL Final   Monocytes Relative 07/24/2024 10  % Final   Monocytes Absolute 07/24/2024 0.6  0.1 - 1.0 K/uL Final   Eosinophils Relative 07/24/2024 4  % Final   Eosinophils Absolute 07/24/2024 0.2  0.0 - 0.5 K/uL Final   Basophils Relative 07/24/2024 1  % Final   Basophils Absolute 07/24/2024 0.0  0.0 - 0.1 K/uL Final   Immature Granulocytes 07/24/2024 0  % Final   Abs Immature Granulocytes 07/24/2024 0.02  0.00 - 0.07 K/uL Final   Performed at [facility], [address] [place], [place] [address]   Sodium 07/24/2024 139  135 - 145 mmol/L Final   Potassium 07/24/2024 3.0 (L)  3.5 - 5.1 mmol/L Final   Chloride 07/24/2024 99  98 - 111 mmol/L Final   CO2 07/24/2024 26  22 - 32 mmol/L Final   Glucose,  Bld 07/24/2024 104 (H)  70 - 99 mg/dL Final   Glucose reference range applies only to samples taken after fasting for at least 8 hours.   BUN 07/24/2024 6  6 - 20 mg/dL Final   Creatinine, Ser 07/24/2024 1.09  0.61 - 1.24 mg/dL Final   Calcium  07/24/2024 9.4  8.9 - 10.3 mg/dL Final   Total Protein 89/81/7974 7.8  6.5 - 8.1 g/dL Final   Albumin 89/81/7974 4.3  3.5 - 5.0 g/dL Final   AST 89/81/7974 22  15 - 41 U/L Final   ALT 07/24/2024 24  0 - 44 U/L Final   Alkaline Phosphatase 07/24/2024 79  38 - 126 U/L Final   Total Bilirubin 07/24/2024 0.7  0.0 - 1.2 mg/dL Final   GFR, Estimated 07/24/2024 >60  >60 mL/min Final   Comment: (NOTE) Calculated using the CKD-EPI Creatinine Equation (2021)    Anion gap 07/24/2024 14  5 - 15 Final   Performed at [facility], [address] [place], [place] [address]   Magnesium  07/24/2024 2.2  1.7 - 2.4 mg/dL Final   Performed at [facility], [address], [place], [place] [address]   Alcohol , Ethyl (B) 07/24/2024 <15  <15 mg/dL Final   Comment: (NOTE) For medical purposes only. Performed at [facility], [address] [place], [place] [address]    Cholesterol 07/24/2024 191  0 - 200 mg/dL Final   Triglycerides 89/81/7974 70  <150 mg/dL Final   HDL 89/81/7974 47  >40 mg/dL Final   Total CHOL/HDL Ratio 07/24/2024 4.1  RATIO Final   VLDL 07/24/2024 14  0 - 40 mg/dL Final   LDL Cholesterol 07/24/2024 130 (H)  0 - 99 mg/dL Final   Comment:        Total Cholesterol/HDL:CHD Risk Coronary Heart Disease Risk  Table                     Men   Women  1/2 Average Risk   3.4   3.3  Average Risk       5.0   4.4  2 X Average Risk   9.6   7.1  3 X Average Risk  23.4   11.0        Use the calculated Patient Ratio above and the CHD Risk Table to determine the patient's CHD Risk.        ATP III CLASSIFICATION (LDL):  <100     mg/dL   Optimal  899-870  mg/dL   Near or Above                    Optimal  130-159  mg/dL   Borderline   839-810  mg/dL   High  >809     mg/dL   Very High Performed at [facility], [address], [place], [place] [address]    Prolactin 07/24/2024 6.8  3.9 - 22.7 ng/mL Final   Comment: (NOTE) Performed At: [facility] [address] [place], [place] [id number] Jennette Shorter MD Ey:[id number]    POC Amphetamine UR 07/25/2024 None Detected  NONE DETECTED (Cut Off Level 1000 ng/mL) Final   POC Secobarbital (BAR) 07/25/2024 None Detected  NONE DETECTED (Cut Off Level 300 ng/mL) Final   POC Buprenorphine (BUP) 07/25/2024 None Detected  NONE DETECTED (Cut Off Level 10 ng/mL) Final   POC Oxazepam (BZO) 07/25/2024 None Detected  NONE DETECTED (Cut Off Level 300 ng/mL) Final   POC Cocaine UR 07/25/2024 None Detected  NONE DETECTED (Cut Off Level 300 ng/mL) Final   POC Methamphetamine UR 07/25/2024 None Detected  NONE DETECTED (Cut Off Level 1000 ng/mL) Final   POC Morphine  07/25/2024 None Detected  NONE DETECTED (Cut Off Level 300 ng/mL) Final   POC Methadone UR 07/25/2024 None Detected  NONE DETECTED (Cut Off Level 300 ng/mL) Final   POC Oxycodone  UR 07/25/2024 None Detected  NONE DETECTED (Cut Off Level 100 ng/mL) Final   POC Marijuana UR 07/25/2024 None Detected  NONE DETECTED (Cut Off Level 50 ng/mL) Final   TSH 07/24/2024 4.174  0.350 - 4.500 uIU/mL Final   Comment: Performed by a 3rd Generation assay with a functional sensitivity of <=0.01 uIU/mL. Performed at [facility], [address] [place], [place] [address]   Admission on 02/06/2024, Discharged on 02/07/2024  Component Date Value Ref Range Status   Sodium 02/06/2024 137  135 - 145 mmol/L Final   Potassium 02/06/2024 3.8  3.5 - 5.1 mmol/L Final   Chloride 02/06/2024 103  98 - 111 mmol/L Final   CO2 02/06/2024 25  22 - 32 mmol/L Final   Glucose, Bld 02/06/2024 111 (H)  70 - 99 mg/dL Final   Glucose reference range applies only to samples taken after fasting for at least 8 hours.   BUN 02/06/2024 15  6 - 20 mg/dL  Final   Creatinine, Ser 02/06/2024 0.89  0.61 - 1.24 mg/dL Final   Calcium  02/06/2024 8.8 (L)  8.9 - 10.3 mg/dL Final   Total Protein 94/97/7974 7.3  6.5 - 8.1 g/dL Final   Albumin 94/97/7974 4.0  3.5 - 5.0 g/dL Final   AST 94/97/7974 29  15 - 41 U/L Final   ALT 02/06/2024 58 (H)  0 - 44 U/L Final   Alkaline Phosphatase 02/06/2024 58  38 - 126 U/L Final   Total Bilirubin 02/06/2024 0.6  0.0 - 1.2 mg/dL Final   GFR, Estimated 02/06/2024 >60  >60 mL/min Final   Comment: (NOTE) Calculated using the CKD-EPI Creatinine Equation (2021)    Anion gap 02/06/2024 9  5 - 15 Final   Performed at [facility], [address] [place], [place] [address]   Alcohol , Ethyl (B) 02/06/2024 <15  <15 mg/dL Final   Comment: Please note change in reference range. (NOTE) For medical purposes only. Performed at [facility], [address] [place], [place] [address]    WBC 02/06/2024 5.1  4.0 - 10.5 K/uL Final   RBC 02/06/2024 4.91  4.22 - 5.81 MIL/uL Final   Hemoglobin 02/06/2024 14.5  13.0 - 17.0 g/dL Final   HCT 94/97/7974 44.3  39.0 - 52.0 % Final   MCV 02/06/2024 90.2  80.0 - 100.0 fL Final   MCH 02/06/2024 29.5  26.0 - 34.0 pg Final   MCHC 02/06/2024 32.7  30.0 - 36.0 g/dL Final   RDW 94/97/7974 12.8  11.5 - 15.5 % Final   Platelets 02/06/2024 333  150 - 400 K/uL Final   nRBC 02/06/2024 0.0  0.0 - 0.2 % Final   Performed at [facility], [address] [place], [place] [address]   Tricyclic, Ur Screen 02/06/2024 NONE DETECTED  NONE DETECTED Final   Amphetamines, Ur Screen 02/06/2024 NONE DETECTED  NONE DETECTED Final   MDMA (Ecstasy)Ur Screen 02/06/2024 NONE DETECTED  NONE DETECTED Final   Cocaine Metabolite,Ur [place] 02/06/2024 NONE DETECTED  NONE DETECTED Final   Opiate, Ur Screen 02/06/2024 NONE DETECTED  NONE DETECTED Final   Phencyclidine (PCP) Ur S 02/06/2024 NONE DETECTED  NONE DETECTED Final   Cannabinoid 50 Ng, Ur [place] 02/06/2024 NONE DETECTED  NONE DETECTED  Final   Barbiturates, Ur Screen 02/06/2024 NONE DETECTED  NONE DETECTED Final   Benzodiazepine, Ur Scrn 02/06/2024 NONE DETECTED  NONE DETECTED Final   Methadone Scn, Ur 02/06/2024 NONE DETECTED  NONE DETECTED Final   Comment: (NOTE) Tricyclics + metabolites, urine    Cutoff 1000 ng/mL Amphetamines + metabolites, urine  Cutoff 1000 ng/mL MDMA (Ecstasy), urine              Cutoff 500 ng/mL Cocaine Metabolite, urine          Cutoff 300 ng/mL Opiate + metabolites, urine        Cutoff 300 ng/mL Phencyclidine (PCP), urine         Cutoff 25 ng/mL Cannabinoid, urine                 Cutoff 50 ng/mL Barbiturates + metabolites, urine  Cutoff 200 ng/mL Benzodiazepine, urine              Cutoff 200 ng/mL Methadone, urine                   Cutoff 300 ng/mL  The urine drug screen provides only a preliminary, unconfirmed analytical test result and should not be used for non-medical purposes. Clinical consideration and professional judgment should be applied to any positive drug screen result due to possible interfering substances. A more specific alternate chemical method must be used in order to obtain a confirmed analytical result. Gas chromatography / mass spectrometry (GC/MS) is the preferred confirm                          atory method. Performed at  [facility], [address] [place], [place] [address]    Salicylate Lvl 02/06/2024 <7.0 (L)  7.0 - 30.0 mg/dL Final   Performed at [facility], [address] [place], [place] [address]   Acetaminophen  (Tylenol ), Serum 02/06/2024 <10 (L)  10 - 30 ug/mL Final   Comment: (NOTE) Therapeutic concentrations vary significantly. A range of 10-30 ug/mL  may be an effective concentration for many patients. However, some  are best treated at concentrations outside of this range. Acetaminophen  concentrations >150 ug/mL at 4 hours after ingestion  and >50 ug/mL at 12 hours after ingestion are often associated with  toxic  reactions.  Performed at [facility], [address] [place], [place] [address]     Blood Alcohol  level:  Lab Results  Component Value Date   [facility] <15 07/24/2024   [facility] <15 02/06/2024    Metabolic Disorder Labs: Lab Results  Component Value Date   HGBA1C 5.9 (H) 12/17/2021   MPG 123 12/17/2021   MPG 119.76 12/03/2020   Lab Results  Component Value Date   PROLACTIN 6.8 07/24/2024   PROLACTIN 8.9 01/04/2021   Lab Results  Component Value Date   CHOL 191 07/24/2024   TRIG 70 07/24/2024   HDL 47 07/24/2024   CHOLHDL 4.1 07/24/2024   VLDL 14 07/24/2024   LDLCALC 130 (H) 07/24/2024   LDLCALC 100 (H) 12/17/2021    Therapeutic Lab Levels: No results found for: LITHIUM No results found for: VALPROATE No results found for: CBMZ  Physical Findings   AUDIT    Flowsheet Row Admission (Discharged) from 07/14/2019 in [facility]INE  Alcohol  Use Disorder Identification Test Final Score (AUDIT) 21   GAD-7    Flowsheet Row Office Visit from 07/22/2023 in [facility] [place] Office Visit from 07/11/2023 in [facility]  Total GAD-7 Score 6 0   PHQ2-9    Flowsheet Row ED from 07/25/2024 in [facility] Office Visit from 07/22/2023 in [facility] [place] Office Visit from 07/11/2023 in [facility] [place] Office Visit from 08/03/2021 in [facility] [place] ED from 12/03/2020 in [facility]  PHQ-2 Total Score 2 0 0 0 4  PHQ-9 Total Score 8 2 0 3 18   Flowsheet Row ED from 07/25/2024 in [facility] ED from 07/24/2024 in [facility] ED from 02/06/2024 in [facility] Emergency Department at [facility]  C-SSRS RISK CATEGORY High Risk High Risk High Risk     Musculoskeletal  Strength &  Muscle Tone: within normal limits Gait & Station: normal Patient leans: N/A  Psychiatric Specialty Exam  Presentation  General Appearance:  Appropriate for Environment  Eye Contact: Good  Speech: Clear and Coherent  Speech Volume: Normal  Handedness: Right   Mood and Affect  Mood: Depressed  Affect: Congruent   Thought Process  Thought Processes: Coherent  Descriptions of Associations:Intact  Orientation:Full (Time, Place and Person)  Thought Content:Perseveration  Diagnosis of Schizophrenia or Schizoaffective disorder in past: No    Hallucinations:Hallucinations: Auditory Description of Auditory Hallucinations: Arrogant, derogatory voces  Ideas of Reference:Paranoia (I always feel like people are there to get me, to hurt me)  Suicidal Thoughts:Suicidal Thoughts: Yes, Passive SI Active Intent and/or Plan: With Intent; Without Plan SI Passive Intent and/or Plan: Without Intent; Without Plan  Homicidal Thoughts:Homicidal  Thoughts: No   Sensorium  Memory: Immediate Fair; Recent Fair; Remote Fair  Judgment: Fair  Insight: Fair   Chartered certified accountant: Fair  Attention Span: Fair  Recall: [employer] of Knowledge: Fair  Language: Fair   Psychomotor Activity  Psychomotor Activity: Psychomotor Activity: Normal   Assets  Assets: Communication Skills; Desire for Improvement; Physical Health   Sleep  Sleep: Sleep: Fair  Estimated Sleeping Duration (Last 24 Hours): 11.25-13.50 hours  Nutritional Assessment (For OBS and FBC admissions only) Has the patient had a weight loss or gain of 10 pounds or more in the last 3 months?: No Has the patient had a decrease in food intake/or appetite?: No Does the patient have dental problems?: No Does the patient have eating habits or behaviors that may be indicators of an eating disorder including binging or inducing vomiting?: No Has the patient recently lost weight without  trying?: 0 Has the patient been eating poorly because of a decreased appetite?: 0 Malnutrition Screening Tool Score: 0    Physical Exam  Physical Exam Vitals and nursing note reviewed.  HENT:     Head: Normocephalic and atraumatic.     Right Ear: Tympanic membrane normal.     Left Ear: Tympanic membrane normal.     Nose: Nose normal.     Mouth/Throat:     Mouth: Mucous membranes are moist.  Eyes:     Extraocular Movements: Extraocular movements intact.     Pupils: Pupils are equal, round, and reactive to light.  Cardiovascular:     Rate and Rhythm: Normal rate.     Pulses: Normal pulses.  Pulmonary:     Effort: Pulmonary effort is normal.  Musculoskeletal:        General: Normal range of motion.     Cervical back: Normal range of motion and neck supple.  Neurological:     General: No focal deficit present.     Mental Status: [name] to person, place, and time.    Review of Systems  Constitutional: Negative.   HENT: Negative.    Eyes: Negative.   Respiratory: Negative.    Cardiovascular: Negative.   Gastrointestinal: Negative.   Genitourinary: Negative.   Musculoskeletal: Negative.   Skin: Negative.   Neurological: Negative.   Endo/Heme/Allergies: Negative.   Psychiatric/Behavioral:  Positive for depression and suicidal ideas. The patient is nervous/anxious.    Blood pressure (!) 118/90, pulse 97, temperature 98.3 F (36.8 C), temperature source Oral, resp. rate 18, SpO2 97%. There is no height or weight on file to calculate BMI.  Treatment Plan Summary: Daily contact with patient to assess and evaluate symptoms and progress in treatment, Medication management, and placement in process.   Randall Bouquet, NP 07/26/2024 6:35 PM

## 2024-07-26 NOTE — ED Notes (Signed)
 Pt administered MOM for c/o stool straining.

## 2024-07-26 NOTE — Group Note (Signed)
 Group Topic: Recovery Basics  Group Date: 07/26/2024 Start Time: 0100 End Time: 0130 Facilitators: Lonzell Dwayne RAMAN, NT  Department: [facility]  Number of Participants: 2  Group Focus: anxiety Treatment Modality:  Psychoeducation Interventions utilized were confrontation Purpose: express irrational fears  Name: Randall Parsons Date of Birth: 1980-10-24  MR: [id number]    Level of Participation: Patient did attend groupminimal Quality of Participation: attentive Interactions with others: gave feedback Mood/Affect: flat Triggers (if applicable): N/A Cognition: not focused Progress: Gaining insight Response: Appropriate  Plan: follow-up needed  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) 12/18/2021   Left wrist pain 08/27/2021   Bronchospasm 02/18/2021   COVID-19 vaccine dose declined 12/20/2020   Hepatitis C test positive 12/20/2020   Prolactin increased 12/20/2020   Advance care planning 12/20/2020   GAD (generalized anxiety disorder) 04/04/2020   Attention deficit hyperactivity disorder, combined type 04/04/2020   Opioid type dependence, continuous (HCC) 04/04/2020   Major depressive disorder 07/14/2019   Alcohol  abuse 07/14/2019   Cocaine abuse (HCC) 07/12/2019   Low back pain 11/28/2017   NICM (nonischemic cardiomyopathy) (HCC) 11/28/2017   Renal stone 11/28/2017   History of seizure 03/14/2017   IVDU (intravenous drug  user) 03/14/2017   Other specified abnormal immunological findings in serum 12/21/2015

## 2024-07-26 NOTE — Care Management (Addendum)
 [facility] Care Management   Writer met with patient and discussed discharge planning.    Patient requests inpatient Mental Health Treatment  Patient reported being clean for eight months and previously Suboxone treatment   Patient reports that he is a homeless resident of [place].    Patient reports increased Suicidal thoughts and worsening feelings of hopelessness and depression   Patient reports that she does not have any friend or family supports.    Patient has [employer].  Therefore, the patient will be referred to [facility]   2:53 pm  Writer reached out to [facility] to follow-up on referral  Patient was declined at [facility] due to having a(n) TBI.   3:07 pm At Patients request (consent on file) Writer spoke with patients Legal Guardian Marley Charlot via phone [phone].  Collateral information.   Ms Butcher reported that Pt has been in and out of psych units for 8 months. He would last for a couple days on out patient then back in psych hospital. Patient experience intrusive thoughts since February. Pt has never hurt anyone. Believes he has been self-medicating since Jan or Feb. Ms Bascom stated that patient really needs to be in long term residential facility

## 2024-07-27 DIAGNOSIS — Z5900 Homelessness unspecified: Secondary | ICD-10-CM | POA: Diagnosis not present

## 2024-07-27 DIAGNOSIS — F333 Major depressive disorder, recurrent, severe with psychotic symptoms: Secondary | ICD-10-CM | POA: Diagnosis not present

## 2024-07-27 DIAGNOSIS — R45851 Suicidal ideations: Secondary | ICD-10-CM | POA: Diagnosis not present

## 2024-07-27 NOTE — Group Note (Signed)
 Group Topic: Social Support  Group Date: 07/27/2024 Start Time: 1930 End Time: 2000 Facilitators: Joan Plowman B  Department: [facility]  Number of Participants: 3  Group Focus: abuse issues, coping skills, goals/reality orientation, healthy friendships, and individual meeting Treatment Modality:  Individual Therapy, Psychoeducation, and Solution-Focused Therapy Interventions utilized were leisure development, patient education, and support Purpose: enhance coping skills, express feelings, and relapse prevention strategies  Name: Randall Parsons Date of Birth: 10/25/1980  MR: [id number]    Level of Participation: PT DID NOT ATTEND GROUP  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) 12/18/2021   Left wrist pain 08/27/2021   Bronchospasm 02/18/2021   COVID-19 vaccine dose declined 12/20/2020   Hepatitis C test positive 12/20/2020   Prolactin increased 12/20/2020   Advance care planning 12/20/2020   GAD (generalized anxiety disorder) 04/04/2020   Attention deficit hyperactivity disorder, combined type 04/04/2020   Opioid type dependence, continuous (HCC) 04/04/2020   Major depressive disorder 07/14/2019   Alcohol  abuse 07/14/2019   Cocaine abuse (HCC) 07/12/2019   Low back pain 11/28/2017   NICM (nonischemic cardiomyopathy) (HCC) 11/28/2017   Renal stone 11/28/2017   History of seizure 03/14/2017   IVDU (intravenous drug user)  03/14/2017   Other specified abnormal immunological findings in serum 12/21/2015

## 2024-07-27 NOTE — Progress Notes (Signed)
Patient attended AA group.   

## 2024-07-27 NOTE — Group Note (Signed)
 Group Topic: Trust and Honesty  Group Date: 07/27/2024 Start Time: 0900 End Time: 1000 Facilitators: Lonzell Dwayne RAMAN, NT MHT 2 Department: [facility]  Number of Participants: 5  Group Focus: acceptance Treatment Modality:  Patient-Centered Therapy Interventions utilized were patient education Purpose: explore maladaptive thinking and improve communication skills  Name: Randall Parsons Date of Birth: 04/21/1981  MR: [id number]    Level of Participation: Patient did not attend group Quality of Participation: N/A Interactions with others: N/A Mood/Affect: N/A Triggers (if applicable): N/A Cognition: N/A Progress: N/A Response: N/A Plan: N/A  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) 12/18/2021   Left wrist pain 08/27/2021   Bronchospasm 02/18/2021   COVID-19 vaccine dose declined 12/20/2020   Hepatitis C test positive 12/20/2020   Prolactin increased 12/20/2020   Advance care planning 12/20/2020   GAD (generalized anxiety disorder) 04/04/2020   Attention deficit hyperactivity disorder, combined type 04/04/2020   Opioid type dependence, continuous (HCC) 04/04/2020   Major depressive disorder 07/14/2019   Alcohol  abuse 07/14/2019   Cocaine abuse (HCC) 07/12/2019   Low back pain 11/28/2017   NICM (nonischemic cardiomyopathy) (HCC) 11/28/2017   Renal stone 11/28/2017   History of seizure 03/14/2017   IVDU (intravenous drug  user) 03/14/2017   Other specified abnormal immunological findings in serum 12/21/2015

## 2024-07-27 NOTE — Group Note (Signed)
 Group Topic: Recovery Basics  Group Date: 07/27/2024 Start Time: 1200 End Time: 1300 Facilitators: Jolanda Mccann, Zane HERO, RN; Herold Lajuana NOVAK, RN; Leron Charolette CROME, RN  Department: [facility]  Number of Participants: 8  Group Focus: nursing group Treatment Modality:  Individual Therapy Interventions utilized were patient education Purpose: express feelings and increase insight  Name: Randall Parsons Date of Birth: December 29, 1980  MR: [id number]    Level of Participation: active  Quality of Participation: attentive and cooperative  Interactions with others: gave feedback  Mood/Affect: appropriate  Triggers (if applicable): None identified at this time  Cognition: Coherent/clear and logical  Progress: Gaining insight  Response: Patient voiced understanding of medications as ordered. Patient voices no concerns about unit/facility at this time. Support and encouragement provided.  Plan: patient will be encouraged to continue to attend groups/programming on the unit.   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) 12/18/2021   Left wrist pain 08/27/2021   Bronchospasm 02/18/2021   COVID-19 vaccine dose declined 12/20/2020   Hepatitis C test positive 12/20/2020   Prolactin increased 12/20/2020   Advance care planning 12/20/2020   GAD (generalized anxiety disorder) 04/04/2020   Attention deficit hyperactivity disorder, combined type 04/04/2020    Opioid type dependence, continuous (HCC) 04/04/2020   Major depressive disorder 07/14/2019   Alcohol  abuse 07/14/2019   Cocaine abuse (HCC) 07/12/2019   Low back pain 11/28/2017   NICM (nonischemic cardiomyopathy) (HCC) 11/28/2017   Renal stone 11/28/2017   History of seizure 03/14/2017   IVDU (intravenous drug user) 03/14/2017   Other specified abnormal immunological findings in serum 12/21/2015

## 2024-07-27 NOTE — ED Notes (Signed)
 Pt is sleeping at the moment, no acute distress noted. Respirations are even and labored.

## 2024-07-27 NOTE — Progress Notes (Signed)
 Patient in dayroom conversing with peers, calm and composed. No acute signs of distress noted. Environment secured. Routine safety checks in place per facility policy. Safety maintained.

## 2024-07-27 NOTE — ED Notes (Signed)
 Pt sitting in dayroom watching television and interacting with peers. No acute distress noted. No concerns voiced. Informed pt to notify staff with any needs or assistance. Pt verbalized understanding and agreement. Will continue to monitor for safety.

## 2024-07-27 NOTE — ED Notes (Signed)
 Pt alert and oriented x4 in no acute distress. Rates pain 6/10 peri rectal given PRN tylenol  650 mg also given scheduled suppository. RR even and unlabored. Environment secured. Will continue to monitor for safety. He states that he has SI on and off. He did not have a plan and did not endorse hallucinations or delusions.

## 2024-07-27 NOTE — ED Notes (Signed)
 Patient speaks softly but pleasant. RN discussed todays GI issues with him and answered all questions related to shift change (snacks, night meds).

## 2024-07-28 DIAGNOSIS — Z5900 Homelessness unspecified: Secondary | ICD-10-CM | POA: Diagnosis not present

## 2024-07-28 DIAGNOSIS — F333 Major depressive disorder, recurrent, severe with psychotic symptoms: Secondary | ICD-10-CM | POA: Diagnosis not present

## 2024-07-28 DIAGNOSIS — R45851 Suicidal ideations: Secondary | ICD-10-CM | POA: Diagnosis not present

## 2024-07-28 MED ORDER — BUPROPION HCL ER (XL) 150 MG PO TB24
150.0000 mg | ORAL_TABLET | Freq: Every day | ORAL | Status: DC
  Administered 2024-07-28 – 2024-07-29 (×2): 150 mg via ORAL
  Filled 2024-07-28: qty 7
  Filled 2024-07-28 (×2): qty 1

## 2024-07-28 NOTE — ED Notes (Signed)
 Patient sleeping with no signs of distress in bed

## 2024-07-28 NOTE — ED Provider Notes (Signed)
 Behavioral Health Progress Note  Date and Time: 07/28/2024 3:19 PM Name: Randall Parsons MRN:  [id number]  Subjective:  Patient presented to [facility] with chief complaint of SI, with a plan of walking into traffic. He also presented with auditory hallucinations, voices telling him to kill himself. Presented with a hx of MDD and has been on Lamictal  along with other medications as he reported. Reported not being established with any outpatient services for mental health. Denied using substances. He was evaluated by attending provider and inpatient was recommended.  Patient was admitted to [facility] and treatment was initiated. Home medications were continued as reviewed (Lamictal  and Latuda). Today, patient reported that he was prescribed Wellbutrin  in the past and it was helping. He requested this medication and it was prescribed after consulting with Dr Cole.   Nursing reports that patient has not been very motivated for groups today.   Patient is seen face-to face by this provider. He is in bed awake. He is more hopeful today and denies thoughts of self-harm, reporting that he is preparing to go to [facility] house for 6 months as his ex-wife is not willing to help him. He sounds to be motivated for this new chapter.  Patient discusses his medications reporting that he was prescribed an antipsychotic other than Latuda but does not remember the name. He then requests to send prescriptions for his current medications including Wellbutrin   that he was prescribed today. He denies SI/HI/AVH. States he will try to find a job while residing at [employer].  He reports that he is feeling motivated and hoping to make a permanent change to his life.   Per Care Coordinator,  Patient has been accepted into their program.Patient can arrive Thursday 10/23/2025RN to provide transportation to [address] [place].  Patient needs 7 day supply   Cheryl request that scripts be sent to [place] 336. [phone]   Patient to be discharged in AM on 07/29/2024 Diagnosis:  Final diagnoses:  Severe episode of recurrent major depressive disorder, with psychotic features (HCC)    Total Time spent with patient: 30 minutes  Past Psychiatric History: MDD, Schizophrenia Past Medical History: TBI Family History: NA Family Psychiatric  History: NA Social History: Homeless, has limited support  Additional Social History: Patient reports he has a sister who is doing well but she does not talk to him                        Sleep: Fair  Appetite:  Good  Current Medications:  Current Facility-Administered Medications  Medication Dose Route Frequency Provider Last Rate Last Admin  . acetaminophen  (TYLENOL ) tablet 650 mg  650 mg Oral Q6H PRN Olasunkanmi, Oluwatosin, NP   650 mg at 07/28/24 0906  . alum & mag hydroxide-simeth (MAALOX/MYLANTA) 200-200-20 MG/5ML suspension 30 mL  30 mL Oral Q4H PRN Olasunkanmi, Oluwatosin, NP      . buPROPion  (WELLBUTRIN  XL) 24 hr tablet 150 mg  150 mg Oral Daily Amylia Collazos M, NP   150 mg at 07/28/24 1400  . haloperidol  (HALDOL ) tablet 5 mg  5 mg Oral TID PRN Olasunkanmi, Oluwatosin, NP       And  . diphenhydrAMINE  (BENADRYL ) capsule 50 mg  50 mg Oral TID PRN Olasunkanmi, Oluwatosin, NP      . diphenhydrAMINE  (BENADRYL ) capsule 50 mg  50 mg Oral Q6H PRN Olasunkanmi, Oluwatosin, NP      . haloperidol  lactate (HALDOL ) injection 5 mg  5 mg  Intramuscular TID PRN Olasunkanmi, Oluwatosin, NP       And  . diphenhydrAMINE  (BENADRYL ) injection 50 mg  50 mg Intramuscular TID PRN Olasunkanmi, Oluwatosin, NP       And  . LORazepam  (ATIVAN ) injection 2 mg  2 mg Intramuscular TID PRN Olasunkanmi, Oluwatosin, NP      . haloperidol  lactate (HALDOL ) injection 10 mg  10 mg Intramuscular TID PRN Olasunkanmi, Oluwatosin, NP       And  . diphenhydrAMINE  (BENADRYL ) injection 50 mg  50 mg Intramuscular TID PRN Olasunkanmi, Oluwatosin, NP       And  . LORazepam  (ATIVAN )  injection 2 mg  2 mg Intramuscular TID PRN Olasunkanmi, Oluwatosin, NP      . hydrOXYzine  (ATARAX ) tablet 25 mg  25 mg Oral TID PRN Olasunkanmi, Oluwatosin, NP   25 mg at 07/28/24 0906  . lamoTRIgine  (LAMICTAL ) tablet 200 mg  200 mg Oral Daily Olasunkanmi, Oluwatosin, NP   200 mg at 07/28/24 0907  . lurasidone (LATUDA) tablet 40 mg  40 mg Oral QPM Hobson, Fran E, NP   40 mg at 07/27/24 1714  . magnesium  hydroxide (MILK OF MAGNESIA) suspension 30 mL  30 mL Oral Daily PRN Olasunkanmi, Oluwatosin, NP   30 mL at 07/26/24 1321  . nicotine (NICODERM CQ - dosed in mg/24 hours) patch 21 mg  21 mg Transdermal Q0600 Olasunkanmi, Oluwatosin, NP      . phenylephrine  ((USE for PREPARATION-H)) 0.25 % suppository 1 suppository  1 suppository Rectal BID Hobson, Fran E, NP   1 suppository at 07/28/24 0907  . traZODone  (DESYREL ) tablet 50 mg  50 mg Oral QHS PRN Olasunkanmi, Oluwatosin, NP   50 mg at 07/27/24 2114   Current Outpatient Medications  Medication Sig Dispense Refill  . lamoTRIgine  (LAMICTAL ) 200 MG tablet Take 200 mg by mouth daily.    . mirtazapine (REMERON SOL-TAB) 30 MG disintegrating tablet Take 30 mg by mouth at bedtime.      Labs  Lab Results:  Admission on 07/24/2024, Discharged on 07/25/2024  Component Date Value Ref Range Status  . WBC 07/24/2024 6.2  4.0 - 10.5 K/uL Final  . RBC 07/24/2024 4.70  4.22 - 5.81 MIL/uL Final  . Hemoglobin 07/24/2024 14.2  13.0 - 17.0 g/dL Final  . HCT 89/81/7974 43.9  39.0 - 52.0 % Final  . MCV 07/24/2024 93.4  80.0 - 100.0 fL Final  . MCH 07/24/2024 30.2  26.0 - 34.0 pg Final  . MCHC 07/24/2024 32.3  30.0 - 36.0 g/dL Final  . RDW 89/81/7974 13.7  11.5 - 15.5 % Final  . Platelets 07/24/2024 360  150 - 400 K/uL Final  . nRBC 07/24/2024 0.0  0.0 - 0.2 % Final  . Neutrophils Relative % 07/24/2024 46  % Final  . Neutro Abs 07/24/2024 2.9  1.7 - 7.7 K/uL Final  . Lymphocytes Relative 07/24/2024 39  % Final  . Lymphs Abs 07/24/2024 2.4  0.7 - 4.0 K/uL Final   . Monocytes Relative 07/24/2024 10  % Final  . Monocytes Absolute 07/24/2024 0.6  0.1 - 1.0 K/uL Final  . Eosinophils Relative 07/24/2024 4  % Final  . Eosinophils Absolute 07/24/2024 0.2  0.0 - 0.5 K/uL Final  . Basophils Relative 07/24/2024 1  % Final  . Basophils Absolute 07/24/2024 0.0  0.0 - 0.1 K/uL Final  . Immature Granulocytes 07/24/2024 0  % Final  . Abs Immature Granulocytes 07/24/2024 0.02  0.00 - 0.07 K/uL Final   Performed at  [facility], [address] [place], [place] [address]  . Sodium 07/24/2024 139  135 - 145 mmol/L Final  . Potassium 07/24/2024 3.0 (L)  3.5 - 5.1 mmol/L Final  . Chloride 07/24/2024 99  98 - 111 mmol/L Final  . CO2 07/24/2024 26  22 - 32 mmol/L Final  . Glucose, Bld 07/24/2024 104 (H)  70 - 99 mg/dL Final   Glucose reference range applies only to samples taken after fasting for at least 8 hours.  . BUN 07/24/2024 6  6 - 20 mg/dL Final  . Creatinine, Ser 07/24/2024 1.09  0.61 - 1.24 mg/dL Final  . Calcium  07/24/2024 9.4  8.9 - 10.3 mg/dL Final  . Total Protein 07/24/2024 7.8  6.5 - 8.1 g/dL Final  . Albumin 89/81/7974 4.3  3.5 - 5.0 g/dL Final  . AST 89/81/7974 22  15 - 41 U/L Final  . ALT 07/24/2024 24  0 - 44 U/L Final  . Alkaline Phosphatase 07/24/2024 79  38 - 126 U/L Final  . Total Bilirubin 07/24/2024 0.7  0.0 - 1.2 mg/dL Final  . GFR, Estimated 07/24/2024 >60  >60 mL/min Final   Comment: (NOTE) Calculated using the CKD-EPI Creatinine Equation (2021)   . Anion gap 07/24/2024 14  5 - 15 Final   Performed at [facility], [address], [place], [place] [address]  . Magnesium  07/24/2024 2.2  1.7 - 2.4 mg/dL Final   Performed at [facility], [address] [place], [place] [address]  . Alcohol , Ethyl (B) 07/24/2024 <15  <15 mg/dL Final   Comment: (NOTE) For medical purposes only. Performed at [facility], [address], [place], [place] [address]   . Cholesterol 07/24/2024 191  0 - 200 mg/dL Final  .  Triglycerides 07/24/2024 70  <150 mg/dL Final  . HDL 89/81/7974 47  >40 mg/dL Final  . Total CHOL/HDL Ratio 07/24/2024 4.1  RATIO Final  . VLDL 07/24/2024 14  0 - 40 mg/dL Final  . LDL Cholesterol 07/24/2024 130 (H)  0 - 99 mg/dL Final   Comment:        Total Cholesterol/HDL:CHD Risk Coronary Heart Disease Risk Table                     Men   Women  1/2 Average Risk   3.4   3.3  Average Risk       5.0   4.4  2 X Average Risk   9.6   7.1  3 X Average Risk  23.4   11.0        Use the calculated Patient Ratio above and the CHD Risk Table to determine the patient's CHD Risk.        ATP III CLASSIFICATION (LDL):  <100     mg/dL   Optimal  899-870  mg/dL   Near or Above                    Optimal  130-159  mg/dL   Borderline  839-810  mg/dL   High  >809     mg/dL   Very High Performed at [facility], [address] [place], [place] [address]   . Prolactin 07/24/2024 6.8  3.9 - 22.7 ng/mL Final   Comment: (NOTE) Performed At: [facility] [address] [place], [place] [id number] Jennette Shorter MD Ey:[id number]   . POC Amphetamine UR 07/25/2024 None Detected  NONE  DETECTED (Cut Off Level 1000 ng/mL) Final  . POC Secobarbital (BAR) 07/25/2024 None Detected  NONE DETECTED (Cut Off Level 300 ng/mL) Final  . POC Buprenorphine (BUP) 07/25/2024 None Detected  NONE DETECTED (Cut Off Level 10 ng/mL) Final  . POC Oxazepam (BZO) 07/25/2024 None Detected  NONE DETECTED (Cut Off Level 300 ng/mL) Final  . POC Cocaine UR 07/25/2024 None Detected  NONE DETECTED (Cut Off Level 300 ng/mL) Final  . POC Methamphetamine UR 07/25/2024 None Detected  NONE DETECTED (Cut Off Level 1000 ng/mL) Final  . POC Morphine  07/25/2024 None Detected  NONE DETECTED (Cut Off Level 300 ng/mL) Final  . POC Methadone UR 07/25/2024 None Detected  NONE DETECTED (Cut Off Level 300 ng/mL) Final  . POC Oxycodone  UR 07/25/2024 None Detected  NONE DETECTED (Cut Off Level 100 ng/mL) Final  . POC Marijuana UR  07/25/2024 None Detected  NONE DETECTED (Cut Off Level 50 ng/mL) Final  . TSH 07/24/2024 4.174  0.350 - 4.500 uIU/mL Final   Comment: Performed by a 3rd Generation assay with a functional sensitivity of <=0.01 uIU/mL. Performed at [facility], [address], [place], [place] [address]   Admission on 02/06/2024, Discharged on 02/07/2024  Component Date Value Ref Range Status  . Sodium 02/06/2024 137  135 - 145 mmol/L Final  . Potassium 02/06/2024 3.8  3.5 - 5.1 mmol/L Final  . Chloride 02/06/2024 103  98 - 111 mmol/L Final  . CO2 02/06/2024 25  22 - 32 mmol/L Final  . Glucose, Bld 02/06/2024 111 (H)  70 - 99 mg/dL Final   Glucose reference range applies only to samples taken after fasting for at least 8 hours.  . BUN 02/06/2024 15  6 - 20 mg/dL Final  . Creatinine, Ser 02/06/2024 0.89  0.61 - 1.24 mg/dL Final  . Calcium  02/06/2024 8.8 (L)  8.9 - 10.3 mg/dL Final  . Total Protein 02/06/2024 7.3  6.5 - 8.1 g/dL Final  . Albumin 94/97/7974 4.0  3.5 - 5.0 g/dL Final  . AST 94/97/7974 29  15 - 41 U/L Final  . ALT 02/06/2024 58 (H)  0 - 44 U/L Final  . Alkaline Phosphatase 02/06/2024 58  38 - 126 U/L Final  . Total Bilirubin 02/06/2024 0.6  0.0 - 1.2 mg/dL Final  . GFR, Estimated 02/06/2024 >60  >60 mL/min Final   Comment: (NOTE) Calculated using the CKD-EPI Creatinine Equation (2021)   . Anion gap 02/06/2024 9  5 - 15 Final   Performed at [facility], [address] [place], [place] [address]  . Alcohol , Ethyl (B) 02/06/2024 <15  <15 mg/dL Final   Comment: Please note change in reference range. (NOTE) For medical purposes only. Performed at [facility], [address] [place], [place] [address]   . WBC 02/06/2024 5.1  4.0 - 10.5 K/uL Final  . RBC 02/06/2024 4.91  4.22 - 5.81 MIL/uL Final  . Hemoglobin 02/06/2024 14.5  13.0 - 17.0 g/dL Final  . HCT 94/97/7974 44.3  39.0 - 52.0 % Final  . MCV 02/06/2024 90.2  80.0 - 100.0 fL Final  . MCH 02/06/2024 29.5   26.0 - 34.0 pg Final  . MCHC 02/06/2024 32.7  30.0 - 36.0 g/dL Final  . RDW 94/97/7974 12.8  11.5 - 15.5 % Final  . Platelets 02/06/2024 333  150 - 400 K/uL Final  . nRBC 02/06/2024 0.0  0.0 - 0.2 % Final   Performed at [facility], [address] [place], [place] [address]  . Tricyclic, Ur Screen 02/06/2024 NONE DETECTED  NONE DETECTED Final  . Amphetamines, Ur Screen 02/06/2024 NONE DETECTED  NONE DETECTED Final  . MDMA (Ecstasy)Ur Screen 02/06/2024 NONE DETECTED  NONE DETECTED Final  . Cocaine Metabolite,Ur [place] 02/06/2024 NONE DETECTED  NONE DETECTED Final  . Opiate, Ur Screen 02/06/2024 NONE DETECTED  NONE DETECTED Final  . Phencyclidine (PCP) Ur S 02/06/2024 NONE DETECTED  NONE DETECTED Final  . Cannabinoid 50 Ng, Ur [place] 02/06/2024 NONE DETECTED  NONE DETECTED Final  . Barbiturates, Ur Screen 02/06/2024 NONE DETECTED  NONE DETECTED Final  . Benzodiazepine, Ur Scrn 02/06/2024 NONE DETECTED  NONE DETECTED Final  . Methadone Scn, Ur 02/06/2024 NONE DETECTED  NONE DETECTED Final   Comment: (NOTE) Tricyclics + metabolites, urine    Cutoff 1000 ng/mL Amphetamines + metabolites, urine  Cutoff 1000 ng/mL MDMA (Ecstasy), urine              Cutoff 500 ng/mL Cocaine Metabolite, urine          Cutoff 300 ng/mL Opiate + metabolites, urine        Cutoff 300 ng/mL Phencyclidine (PCP), urine         Cutoff 25 ng/mL Cannabinoid, urine                 Cutoff 50 ng/mL Barbiturates + metabolites, urine  Cutoff 200 ng/mL Benzodiazepine, urine              Cutoff 200 ng/mL Methadone, urine                   Cutoff 300 ng/mL  The urine drug screen provides only a preliminary, unconfirmed analytical test result and should not be used for non-medical purposes. Clinical consideration and professional judgment should be applied to any positive drug screen result due to possible interfering substances. A more specific alternate chemical method must be used in order to obtain a confirmed  analytical result. Gas chromatography / mass spectrometry (GC/MS) is the preferred confirm                          atory method. Performed at [facility], [address] [place], [place] [address]   . Salicylate Lvl 02/06/2024 <7.0 (L)  7.0 - 30.0 mg/dL Final   Performed at [facility], [address] [place], [place] [address]  . Acetaminophen  (Tylenol ), Serum 02/06/2024 <10 (L)  10 - 30 ug/mL Final   Comment: (NOTE) Therapeutic concentrations vary significantly. A range of 10-30 ug/mL  may be an effective concentration for many patients. However, some  are best treated at concentrations outside of this range. Acetaminophen  concentrations >150 ug/mL at 4 hours after ingestion  and >50 ug/mL at 12 hours after ingestion are often associated with  toxic reactions.  Performed at [facility], [address] [place], [place] [address]     Blood Alcohol  level:  Lab Results  Component Value Date   [facility] <15 07/24/2024   [facility] <15 02/06/2024    Metabolic Disorder Labs: Lab Results  Component Value Date   HGBA1C 5.9 (H) 12/17/2021   MPG 123 12/17/2021   MPG 119.76 12/03/2020   Lab Results  Component Value Date   PROLACTIN 6.8 07/24/2024   PROLACTIN 8.9 01/04/2021   Lab Results  Component Value Date   CHOL 191 07/24/2024   TRIG 70 07/24/2024   HDL 47 07/24/2024   CHOLHDL 4.1 07/24/2024  VLDL 14 07/24/2024   LDLCALC 130 (H) 07/24/2024   LDLCALC 100 (H) 12/17/2021    Therapeutic Lab Levels: No results found for: LITHIUM No results found for: VALPROATE No results found for: CBMZ  Physical Findings   AUDIT    Flowsheet Row Admission (Discharged) from 07/14/2019 in [facility]  Alcohol  Use Disorder Identification Test Final Score (AUDIT) 21   GAD-7    Flowsheet Row Office Visit from 07/22/2023 in [facility] Office Visit from 07/11/2023 in [facility]  at [facility]  Total GAD-7 Score 6 0   PHQ2-9    Flowsheet Row ED from 07/25/2024 in [facility] Office Visit from 07/22/2023 in [facility] [place] [facility] Office Visit from 07/11/2023 in [facility] [place] [facility] Office Visit from 08/03/2021 in [facility] [place] [facility] [place] ED from 12/03/2020 in [facility]  PHQ-2 Total Score 2 0 0 0 4  PHQ-9 Total Score 8 2 0 3 18   Flowsheet Row ED from 07/25/2024 in [facility] ED from 07/24/2024 in [facility] ED from 02/06/2024 in [facility] Department at [facility]  C-SSRS RISK CATEGORY High Risk High Risk High Risk     Musculoskeletal  Strength & Muscle Tone: within normal limits Gait & Station: normal Patient leans: N/A  Psychiatric Specialty Exam  Presentation  General Appearance:  Appropriate for Environment  Eye Contact: Good  Speech: Clear and Coherent  Speech Volume: Normal  Handedness: Right   Mood and Affect  Mood: Depressed  Affect: Congruent   Thought Process  Thought Processes: Coherent  Descriptions of Associations:Intact  Orientation:Full (Time, Place and Person)  Thought Content:Perseveration  Diagnosis of Schizophrenia or Schizoaffective disorder in past: No    Hallucinations:No data recorded Ideas of Reference:Paranoia (I always feel like people are there to get me, to hurt me)  Suicidal Thoughts:No data recorded Homicidal Thoughts:No data recorded  Sensorium  Memory: Immediate Fair; Recent Fair; Remote Fair  Judgment: Fair  Insight: Fair   Art therapist  Concentration: Fair  Attention Span: Fair  Recall: [employer] of Knowledge: Fair  Language: Fair   Psychomotor Activity  Psychomotor Activity:No data recorded  Assets  Assets: Communication Skills; Desire for Improvement;  Physical Health   Sleep  Sleep:No data recorded Estimated Sleeping Duration (Last 24 Hours): 8.25-10.50 hours  No data recorded  Physical Exam  Physical Exam Vitals and nursing note reviewed.  Constitutional:      Appearance: Normal appearance.  HENT:     Head: Normocephalic and atraumatic.     Right Ear: Tympanic membrane normal.     Left Ear: Tympanic membrane normal.     Nose: Nose normal.     Mouth/Throat:     Mouth: Mucous membranes are moist.  Eyes:     Extraocular Movements: Extraocular movements intact.     Pupils: Pupils are equal, round, and reactive to light.  Cardiovascular:     Rate and Rhythm: Normal rate.     Pulses: Normal pulses.  Pulmonary:     Effort: Pulmonary effort is normal.  Musculoskeletal:        General: Normal range of motion.     Cervical back: Normal range of motion and neck supple.  Neurological:     General: No focal deficit present.     Mental Status: [name]  to person, place, and time.   Review of Systems  Constitutional: Negative.   HENT: Negative.    Eyes: Negative.   Respiratory: Negative.    Cardiovascular: Negative.   Gastrointestinal: Negative.   Genitourinary: Negative.   Musculoskeletal: Negative.   Skin: Negative.   Neurological: Negative.   Endo/Heme/Allergies: Negative.   Psychiatric/Behavioral:  Positive for depression and hallucinations.    Blood pressure 120/84, pulse 96, temperature 98.5 F (36.9 C), temperature source Oral, resp. rate 18, SpO2 98%. There is no height or weight on file to calculate BMI.  Treatment Plan Summary: Daily contact with patient to assess and evaluate symptoms and progress in treatment, Medication management, and Plan to discharge in AM  Randall Bouquet, NP 07/28/2024 3:19 PM

## 2024-07-28 NOTE — ED Notes (Signed)
 Paitent provided lunch.

## 2024-07-28 NOTE — ED Notes (Signed)
 Patient provided breakfast

## 2024-07-28 NOTE — Care Management (Signed)
 [facility] Care Management...  Writer spoke with Cheryl @ [facility]  Per Cheryl, Patient has been accepted into their program.  Patient can arrive Thursday 07/29/2024  RN to provide transportation to [address] [place]  Patient needs 7 day supply   Cheryl request that scripts be sent to [place] [phone]. [phone]   Patient accepted placement. However stated that he will speak to Ex-wife

## 2024-07-28 NOTE — Discharge Instructions (Signed)
 Patient is instructed prior to discharge to:  Take all medications as prescribed by his/her mental healthcare provider. Report any adverse effects and or reactions from the medicines to his/her outpatient provider promptly. Keep all scheduled appointments, to ensure that you are getting refills on time and to avoid any interruption in your medication.  If you are unable to keep an appointment call to reschedule.  Be sure to follow-up with resources and follow-up appointments provided.  Patient has been instructed & cautioned: To not engage in alcohol  and or illegal drug use while on prescription medicines. In the event of worsening symptoms, patient is instructed to call the crisis hotline, 911 and or go to the nearest ED for appropriate evaluation and treatment of symptoms. To follow-up with his/her primary care provider for your other medical issues, concerns and or health care needs.  Information: -National Suicide Prevention Lifeline 1-800-SUICIDE or 985-407-0095.  -988 offers 24/7 access to trained crisis counselors who can help people experiencing mental health-related distress. People can call or text 988 or chat 988lifeline.org for themselves or if they are worried about a loved one who may need crisis support.      [facility] Care Management...  Writer spoke with Cheryl @ [facility]  Per Cheryl, Patient has been accepted into their program.  Patient can arrive Thursday 07/29/2024  RN to provide transportation to [address] [place]  Patient needs 7 day supply   Cheryl request that scripts be sent to [place] 336. [phone]

## 2024-07-28 NOTE — ED Notes (Signed)
 Patient sleeping with no signs of distress in bedroom

## 2024-07-28 NOTE — ED Notes (Signed)
 Patient is in the dayroom with other patients calm and composed, watching TV and eating evening snacks. NAD. Denies needing anything at the moment. Environment secured per policy. Will monitor closely for safety.

## 2024-07-28 NOTE — Care Management (Addendum)
 [facility] CARE MANAGEMENT...  Clinical research associate met with Teacher, music provided patient with Dentist for [employer] and [employer].  Writer provided patient with number to [facility] [phone]  Patient reported that he dose not receive any SSI Benefits.   Writer advised patient to continue making phone calls  10:00 am Patient contacted [facility]  11:37 am Patient emergency contact asked writer to reach out to her  Writer contacted Alix Anthony, pt ex-wife. Ms Schreifels reported that she considered having patient live her for thirty days for patient to get stable on medication(s). Ms. Gramm also shared how patient has been kicked out of several programs, unable to return to [employer].

## 2024-07-28 NOTE — Group Note (Signed)
 Group Topic: Trust and Honesty  Group Date: 07/28/2024 Start Time: 2000 End Time: 2130 Facilitators: Joan Plowman B  Department: [facility]  Number of Participants: 3  Group Focus: acceptance, activities of daily living skills, affirmation, check in, communication, coping skills, daily focus, depression, feeling awareness/expression, loss/grief issues, relaxation, self-esteem, social skills, and substance abuse education Treatment Modality:  Patient-Centered Therapy Interventions utilized were leisure development, patient education, story telling, and support Purpose: enhance coping skills, explore maladaptive thinking, express feelings, express irrational fears, increase insight, regain self-worth, and reinforce self-care  Name: Randall Parsons Date of Birth: 06-Sep-1981  MR: [id number]    Level of Participation: active Quality of Participation: cooperative Interactions with others: gave feedback Mood/Affect: appropriate Triggers (if applicable): NA Cognition: coherent/clear Progress: Gaining insight Response: NA Plan: patient will be encouraged to keep going to groups  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) 12/18/2021   Left wrist pain 08/27/2021   Bronchospasm 02/18/2021   COVID-19 vaccine dose declined 12/20/2020   Hepatitis C test positive 12/20/2020   Prolactin increased 12/20/2020   Advance care planning 12/20/2020    GAD (generalized anxiety disorder) 04/04/2020   Attention deficit hyperactivity disorder, combined type 04/04/2020   Opioid type dependence, continuous (HCC) 04/04/2020   Major depressive disorder 07/14/2019   Alcohol  abuse 07/14/2019   Cocaine abuse (HCC) 07/12/2019   Low back pain 11/28/2017   NICM (nonischemic cardiomyopathy) (HCC) 11/28/2017   Renal stone 11/28/2017   History of seizure 03/14/2017   IVDU (intravenous drug user) 03/14/2017   Other specified abnormal immunological findings in serum 12/21/2015

## 2024-07-28 NOTE — ED Notes (Signed)
 Paitent had breakfast.

## 2024-07-28 NOTE — ED Notes (Signed)
 Pt c/o needing underwear, saying that ex wife brought in bag of items this morning. RN provided pt with three pair of underwear from belongs (purple bag), the rest of belongings remained in locker.

## 2024-07-28 NOTE — ED Notes (Signed)
 Paitent provided dinner.

## 2024-07-28 NOTE — ED Notes (Signed)
 Pt presents as pleasant, but somewhat anxious. Pt denies si hi and avh- verbal contract for safety provided. Pt administered tylenol  for level 6/10 pain hemorrhoids- pt denies bleeding,  and atarax  for anxiety. Pt says he ate breakfast.

## 2024-07-28 NOTE — Group Note (Signed)
 Group Topic: Understanding Self  Group Date: 07/28/2024 Start Time: 1300 End Time: 1400 Facilitators: Veverly Oddis BRAVO, NT  Department: [facility]  Number of Participants: 5  Group Focus: self-awareness Treatment Modality:  Cognitive Behavioral Therapy Interventions utilized were assignment Purpose: increase insight  Name: Randall Parsons Date of Birth: 12-Apr-1981  MR: [id number]    Level of Participation: minimal Quality of Participation: cooperative Interactions with others: gave feedback Mood/Affect: appropriate Triggers (if applicable): none Cognition: coherent/clear Progress: Moderate Response: I am someone who would rather work than not work Plan: patient will be encouraged to continue being positive about working as well as attending group.   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) 12/18/2021   Left wrist pain 08/27/2021   Bronchospasm 02/18/2021   COVID-19 vaccine dose declined 12/20/2020   Hepatitis C test positive 12/20/2020   Prolactin increased 12/20/2020   Advance care planning 12/20/2020   GAD (generalized anxiety disorder) 04/04/2020   Attention deficit hyperactivity disorder, combined type 04/04/2020   Opioid type dependence, continuous (HCC) 04/04/2020   Major depressive disorder 07/14/2019   Alcohol  abuse 07/14/2019   Cocaine abuse (HCC) 07/12/2019   Low back pain 11/28/2017   NICM (nonischemic  cardiomyopathy) (HCC) 11/28/2017   Renal stone 11/28/2017   History of seizure 03/14/2017   IVDU (intravenous drug user) 03/14/2017   Other specified abnormal immunological findings in serum 12/21/2015

## 2024-07-28 NOTE — ED Notes (Signed)
 Patient is in the bedroom calm and composed. NAD. Will continue to monitor for safety.

## 2024-07-28 NOTE — Group Note (Signed)
 Group Topic: Wellness  Group Date: 07/28/2024 Start Time: 1200 End Time: 1230 Facilitators: Daved Tinnie HERO, RN; Ward, Zane HERO, RN  Department: [facility]  Number of Participants: 6  Group Focus: nursing group Treatment Modality:  Psychoeducation Interventions utilized were patient education Purpose: increase insight  Name: Randall Parsons Date of Birth: Sep 15, 1981  MR: [id number]    Level of Participation: moderate Quality of Participation: cooperative Interactions with others: gave feedback Mood/Affect: appropriate Triggers (if applicable): n/a Cognition: coherent/clear Progress: Gaining insight Response: RN reviewed medications with pt, pt expressed understanding Plan: patient will be encouraged to attend future RN education groups and discuss medications with RN and providers as needed.   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) 12/18/2021   Left wrist pain 08/27/2021   Bronchospasm 02/18/2021   COVID-19 vaccine dose declined 12/20/2020   Hepatitis C test positive 12/20/2020   Prolactin increased 12/20/2020   Advance care planning 12/20/2020   GAD (generalized anxiety disorder) 04/04/2020   Attention deficit hyperactivity disorder, combined type 04/04/2020   Opioid type dependence, continuous (HCC) 04/04/2020   Major depressive disorder 07/14/2019   Alcohol  abuse 07/14/2019   Cocaine abuse (HCC) 07/12/2019   Low  back pain 11/28/2017   NICM (nonischemic cardiomyopathy) (HCC) 11/28/2017   Renal stone 11/28/2017   History of seizure 03/14/2017   IVDU (intravenous drug user) 03/14/2017   Other specified abnormal immunological findings in serum 12/21/2015

## 2024-07-28 NOTE — ED Notes (Signed)
 Paitent encouraged to attend AA group. Paitent declined.

## 2024-07-28 NOTE — ED Notes (Signed)
 Pt continues to present as polite and pleasant. Pt just took shower. Latuda medication discussed with pt, questions denied. No concerns or complaints reported to RN.

## 2024-07-29 DIAGNOSIS — F333 Major depressive disorder, recurrent, severe with psychotic symptoms: Secondary | ICD-10-CM | POA: Diagnosis not present

## 2024-07-29 DIAGNOSIS — Z5900 Homelessness unspecified: Secondary | ICD-10-CM | POA: Diagnosis not present

## 2024-07-29 DIAGNOSIS — R45851 Suicidal ideations: Secondary | ICD-10-CM | POA: Diagnosis not present

## 2024-07-29 MED ORDER — LURASIDONE HCL 40 MG PO TABS: 40.0000 mg | ORAL_TABLET | Freq: Every evening | ORAL | 0 refills | Status: DC

## 2024-07-29 MED ORDER — BUPROPION HCL ER (XL) 150 MG PO TB24: 150.0000 mg | ORAL_TABLET | Freq: Every day | ORAL | 0 refills | Status: DC

## 2024-07-29 MED ORDER — TRAZODONE HCL 50 MG PO TABS
50.0000 mg | ORAL_TABLET | Freq: Every evening | ORAL | 0 refills | Status: DC | PRN
Start: 2024-07-29 — End: 2024-08-23

## 2024-07-29 MED ORDER — LAMOTRIGINE 200 MG PO TABS: 200.0000 mg | ORAL_TABLET | Freq: Every day | ORAL | 0 refills | Status: DC

## 2024-07-29 MED ORDER — NICOTINE 21 MG/24HR TD PT24: 21.0000 mg | MEDICATED_PATCH | Freq: Every day | TRANSDERMAL | 0 refills | Status: DC

## 2024-07-29 MED ORDER — HYDROXYZINE HCL 25 MG PO TABS: 25.0000 mg | ORAL_TABLET | Freq: Three times a day (TID) | ORAL | 0 refills | Status: DC | PRN

## 2024-07-29 NOTE — ED Notes (Signed)
 Pt observed lying in bed. Eyes closed respirations even and non labored. NAD q 15 minute observations continue for safety.

## 2024-07-29 NOTE — ED Notes (Signed)
 Patient asleep, NAD. Will keep monitoring for safety.

## 2024-07-29 NOTE — Care Management (Signed)
 [facility] Care Management...  Writer met with patient to discuss discharge plan...  Patient has been accepted to [facility] discharging today 07/29/2024 by 11:00 am to [address] [place]  Patient has accepted residential placement

## 2024-07-29 NOTE — ED Notes (Signed)
 Stable. A&O x 4.  Discharging to [name] via [facility]  Pt provided with 7 day home medications with 30 scripts sent to [facility]   Denies current SI plan and Intent.  Denies HI and A/V hallucinations.   All belongings returned to PT.  F/U instruction reviewed  Pt verbalized understanding
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
## 2024-08-19 ENCOUNTER — Encounter (HOSPITAL_COMMUNITY): Payer: Self-pay | Admitting: *Deleted

## 2024-08-19 ENCOUNTER — Ambulatory Visit (HOSPITAL_COMMUNITY)
Admission: EM | Admit: 2024-08-19 | Discharge: 2024-08-19 | Disposition: A | Discharge status: Home / Self Care | Payer: MEDICAID | Attending: Emergency Medicine | Admitting: Emergency Medicine

## 2024-08-19 ENCOUNTER — Other Ambulatory Visit: Payer: Self-pay

## 2024-08-19 DIAGNOSIS — L02213 Cutaneous abscess of chest wall: Secondary | ICD-10-CM

## 2024-08-19 MED ORDER — SULFAMETHOXAZOLE-TRIMETHOPRIM 800-160 MG PO TABS: 1.0000 | ORAL_TABLET | Freq: Two times a day (BID) | ORAL | 0 refills | Status: DC

## 2024-08-19 MED ORDER — IBUPROFEN 800 MG PO TABS: 800.0000 mg | ORAL_TABLET | Freq: Three times a day (TID) | ORAL | 0 refills | Status: DC

## 2024-08-19 NOTE — Discharge Instructions (Signed)
 Take the antibiotics twice daily with food for the next 7 days.  Take take the ibuprofen  every 8 hours to help with pain and inflammation.  Warm compresses twice daily can help as well.  The area may not drain at all, or may drain spontaneously.  If you do not have any improvement in the pain, size or appearance despite antibiotic use please return to clinic for reevaluation.

## 2024-08-19 NOTE — ED Provider Notes (Signed)
 [facility]    CSN: [id number] Arrival date & time: 08/19/24  0802      History   Chief Complaint Chief Complaint  Patient presents with   Mass    HPI Randall Parsons is a 43 y.o. male.   Patient presents to clinic over concern of a painful erythematous area to the left breast that he noticed 2 days ago.  Has been wearing thermal shirts a lot.  Noticed the area 2 days ago and over the past 2 days described more painful.  Has not had any drainage.  Has not tried medications or interventions.  Has not had any fevers.  History of IV drug use.  The history is provided by the patient and medical records.    Past Medical History:  Diagnosis Date   Anxiety    Asthma    CHF (congestive heart failure) (HCC)    Depression    Exposure to hepatitis B    Exposure to hepatitis C    Hepatitis C    History of kidney stones    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, ending around 2017   Hypertension    IV drug abuse (HCC)    Myocardial infarction [facility])    Renal disorder    kidney stones   Stroke [facility])     Patient Active Problem List   Diagnosis Date Noted   Severe recurrent major depression with psychotic features (HCC) 07/25/2024   Muscle pain 07/13/2023   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/01/2023   Abscess of finger 01/31/2023   Hypokalemia 01/31/2023   Paranoid delusion (HCC) 01/31/2023   History of hepatitis C 01/31/2023   Toxic encephalopathy 01/10/2023   MDD (major depressive disorder), recurrent episode, severe (HCC) 12/22/2021   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) 12/18/2021   Left wrist pain 08/27/2021   Bronchospasm 02/18/2021   COVID-19 vaccine dose declined 12/20/2020   Hepatitis C test positive 12/20/2020   Prolactin increased 12/20/2020   Advance care planning 12/20/2020   GAD (generalized anxiety disorder) 04/04/2020   Attention deficit hyperactivity  disorder, combined type 04/04/2020   Opioid type dependence, continuous (HCC) 04/04/2020   Major depressive disorder 07/14/2019   Alcohol  abuse 07/14/2019   Cocaine abuse (HCC) 07/12/2019   Low back pain 11/28/2017   NICM (nonischemic cardiomyopathy) (HCC) 11/28/2017   Renal stone 11/28/2017   History of seizure 03/14/2017   IVDU (intravenous drug user) 03/14/2017   Other specified abnormal immunological findings in serum 12/21/2015    Past Surgical History:  Procedure Laterality Date   APPENDECTOMY     EYE SURGERY     secondary to dog bite   I & D EXTREMITY Right 01/31/2023   Procedure: IRRIGATION AND DEBRIDEMENT INDEX FINGER;  Surgeon: Murrell Drivers, MD;  Location: [facility] OR;  Service: Orthopedics;  Laterality: Right;   TIBIA FRACTURE SURGERY Right        Home Medications    Prior to Admission medications   Medication Sig Start Date End Date Taking? Authorizing Provider  buPROPion  (WELLBUTRIN  XL) 150 MG 24 hr tablet Take 1 tablet (150 mg total) by mouth daily. 07/30/24  Yes Dasie Ellouise CROME, FNP  ibuprofen  (ADVIL ) 800 MG tablet Take 1 tablet (800 mg total) by mouth 3 (three) times daily. 08/19/24  Yes Eleesha Purkey  N, FNP  lamoTRIgine  (LAMICTAL ) 200 MG tablet Take 1 tablet (200 mg total) by mouth daily. 07/29/24  Yes Dasie Ellouise  L, FNP  lurasidone (LATUDA) 40 MG TABS tablet Take 1 tablet (40 mg total) by mouth every evening. 07/29/24  Yes Dasie Ellouise CROME, FNP  sulfamethoxazole -trimethoprim  (BACTRIM  DS) 800-160 MG tablet Take 1 tablet by mouth 2 (two) times daily for 7 days. 08/19/24 08/26/24 Yes Caterin Tabares  N, FNP  traZODone  (DESYREL ) 50 MG tablet Take 1 tablet (50 mg total) by mouth at bedtime as needed for sleep. 07/29/24  Yes Dasie Ellouise CROME, FNP  pantoprazole  (PROTONIX ) 40 MG tablet Take 1 tablet (40 mg total) by mouth daily. 07/17/19 08/03/19  Clapacs, Norleen DASEN, MD    Family History Family History  Problem Relation Age of Onset   Alcohol  abuse Father    Melanoma Mother     Breast cancer Mother    Colon cancer Neg Hx    Prostate cancer Neg Hx     Social History Social History   Tobacco Use   Smoking status: Former   Smokeless tobacco: Never  Vaping Use   Vaping status: Every Day  Substance Use Topics   Alcohol  use: No   Drug use: Not Currently    Types: IV, Cocaine    Comment: hx of heroin abuse (opioid addiction), crack     Allergies   Amoxicillin and Prednisone    Review of Systems Review of Systems  Per HPI  Physical Exam Triage Vital Signs ED Triage Vitals  Encounter Vitals Group     BP 08/19/24 0825 116/69     Girls Systolic BP Percentile --      Girls Diastolic BP Percentile --      Boys Systolic BP Percentile --      Boys Diastolic BP Percentile --      Pulse Rate 08/19/24 0825 75     Resp 08/19/24 0825 20     Temp 08/19/24 0825 (!) 97.5 F (36.4 C)     Temp src --      SpO2 08/19/24 0825 97 %     Weight --      Height --      Head Circumference --      Peak Flow --      Pain Score 08/19/24 0821 10     Pain Loc --      Pain Education --      Exclude from Growth Chart --    No data found.  Updated Vital Signs BP 116/69   Pulse 75   Temp (!) 97.5 F (36.4 C)   Resp 20   SpO2 97%   Visual Acuity Right Eye Distance:   Left Eye Distance:   Bilateral Distance:    Right Eye Near:   Left Eye Near:    Bilateral Near:     Physical Exam Vitals and nursing note reviewed.  Constitutional:      Appearance: Normal appearance.  HENT:     Head: Normocephalic and atraumatic.     Right Ear: External ear normal.     Left Ear: External ear normal.     Nose: Nose normal.     Mouth/Throat:     Mouth: Mucous membranes are moist.  Eyes:     Conjunctiva/sclera: Conjunctivae normal.  Cardiovascular:     Rate and Rhythm: Normal rate.  Pulmonary:     Effort: Pulmonary effort is normal. No respiratory distress.  Musculoskeletal:        General: Normal range of motion.  Skin:    General: Skin is warm and dry.      Findings: Abscess  present.         Comments: 1 cm x 1 cm round indurated area with overlying erythema on the left breast concerning for early abscess  Neurological:     General: No focal deficit present.     Mental Status: [name] to person, place, and time.  Psychiatric:        Mood and Affect: Mood normal.        Behavior: Behavior normal. Behavior is cooperative.      UC Treatments / Results  Labs (all labs ordered are listed, but only abnormal results are displayed) Labs Reviewed - No data to display  EKG   Radiology No results found.  Procedures Procedures (including critical care time)  Medications Ordered in UC Medications - No data to display  Initial Impression / Assessment and Plan / UC Course  I have reviewed the triage vital signs and the nursing notes.  Pertinent labs & imaging results that were available during my care of the patient were reviewed by me and considered in my medical decision making (see chart for details).  Vitals and triage reviewed, patient is hemodynamically stable.  Indurated abscess to the left breast.  Without associated lymphadenopathy of the axilla.  Abscess is deep, small and indurated, not a candidate for incision and drainage today in clinic.  With history of IV drug abuse will cover with Bactrim  for MRSA.  Pain management discussed.  Plan of care, follow-up care return precautions given, no questions at this time.    Final Clinical Impressions(s) / UC Diagnoses   Final diagnoses:  Cutaneous abscess of chest wall     Discharge Instructions      Take the antibiotics twice daily with food for the next 7 days.  Take take the ibuprofen  every 8 hours to help with pain and inflammation.  Warm compresses twice daily can help as well.  The area may not drain at all, or may drain spontaneously.  If you do not have any improvement in the pain, size or appearance despite antibiotic use please return to clinic for  reevaluation.    ED Prescriptions     Medication Sig Dispense Auth. Provider   sulfamethoxazole -trimethoprim  (BACTRIM  DS) 800-160 MG tablet Take 1 tablet by mouth 2 (two) times daily for 7 days. 14 tablet James Lafalce  N, FNP   ibuprofen  (ADVIL ) 800 MG tablet Take 1 tablet (800 mg total) by mouth 3 (three) times daily. 21 tablet Dreama, Leveon Pelzer  N, FNP      PDMP not reviewed this encounter.   Dreama Melburn SAILOR, [place] 08/19/24 [phone]

## 2024-08-19 NOTE — ED Triage Notes (Signed)
 PT reports for 2 days he had had a red lump beside his Lt nipple . Pt reports the site is painful and red. Site has not drained.

## 2024-08-21 ENCOUNTER — Ambulatory Visit (HOSPITAL_COMMUNITY)
Admission: EM | Admit: 2024-08-21 | Discharge: 2024-08-21 | Disposition: A | Discharge status: Home / Self Care | Payer: MEDICAID | Attending: Family Medicine | Admitting: Family Medicine

## 2024-08-21 DIAGNOSIS — L03313 Cellulitis of chest wall: Secondary | ICD-10-CM | POA: Diagnosis not present

## 2024-08-21 MED ORDER — CLINDAMYCIN HCL 300 MG PO CAPS: 300.0000 mg | ORAL_CAPSULE | Freq: Three times a day (TID) | ORAL | 0 refills | Status: AC

## 2024-08-21 MED ORDER — KETOROLAC TROMETHAMINE 30 MG/ML IJ SOLN
INTRAMUSCULAR | Status: AC
  Filled 2024-08-21: qty 1

## 2024-08-21 MED ORDER — KETOROLAC TROMETHAMINE 30 MG/ML IJ SOLN
30.0000 mg | Freq: Once | INTRAMUSCULAR | Status: AC
  Administered 2024-08-21: 30 mg via INTRAMUSCULAR

## 2024-08-21 MED ORDER — CEFTRIAXONE SODIUM 1 G IJ SOLR
1.0000 g | Freq: Once | INTRAMUSCULAR | Status: AC
Start: 2024-08-21 — End: 2024-08-21
  Administered 2024-08-21: 1 g via INTRAMUSCULAR

## 2024-08-21 MED ORDER — CEFTRIAXONE SODIUM 1 G IJ SOLR
INTRAMUSCULAR | Status: AC
  Filled 2024-08-21: qty 10

## 2024-08-21 MED ORDER — DOXYCYCLINE HYCLATE 100 MG PO TABS: 100.0000 mg | ORAL_TABLET | Freq: Once | ORAL | Status: DC

## 2024-08-21 MED ORDER — LIDOCAINE HCL (PF) 1 % IJ SOLN
INTRAMUSCULAR | Status: AC
Start: 2024-08-21 — End: 2024-08-21
  Filled 2024-08-21: qty 2

## 2024-08-21 MED ORDER — KETOROLAC TROMETHAMINE 10 MG PO TABS: 10.0000 mg | ORAL_TABLET | Freq: Four times a day (QID) | ORAL | 0 refills | Status: DC | PRN

## 2024-08-21 MED ORDER — PENTAFLUOROPROP-TETRAFLUOROETH EX AERO
INHALATION_SPRAY | CUTANEOUS | Status: AC
  Filled 2024-08-21: qty 30

## 2024-08-21 NOTE — ED Triage Notes (Signed)
 PT reports left nipple is red and swollen. Was seen 08/19/2024 for same symptoms. Taking Bactrim .

## 2024-08-21 NOTE — Discharge Instructions (Addendum)
 You have been given a shot of Toradol  30 mg today.  Ketorolac  10 mg tablets--take 1 tablet every 6 hours as needed for pain.  This is the same medicine that is in the shot we just gave you  You have been given a shot of ceftriaxone  1000 mg, an antibiotic.   Stop taking the sulfa  antibiotic as you are not improving on it.   --Take clindamycin  300 mg-- 1 capsule 3 times daily for 7 days. Start this as soon as you can.  If you worsen in anyway, please consider going to the ER for further evaluation and treatment

## 2024-08-21 NOTE — ED Provider Notes (Signed)
 [facility]    CSN: [id number] Arrival date & time: 08/21/24  1642      History   Chief Complaint Chief Complaint  Patient presents with   Breast Problem    HPI Randall Parsons is a 43 y.o. male.   HPI Here for continued redness and swelling overlying his left nipple.  He was seen here 11/13 and placed on Bactrim . He has been taking the medication as prescribed, but the pain has increased some, and it looks worse (with a scab on it), though he is not sure if the size of the red swollen area has increased since seen.  He is allergic to amoxicillin which causes rash, and to prednisone , which makes him irritable.   Past Medical History:  Diagnosis Date   Anxiety    Asthma    CHF (congestive heart failure) (HCC)    Depression    Exposure to hepatitis B    Exposure to hepatitis C    Hepatitis C    History of kidney stones    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, ending around 2017   Hypertension    IV drug abuse (HCC)    Myocardial infarction [facility])    Renal disorder    kidney stones   Stroke [facility])     Patient Active Problem List   Diagnosis Date Noted   Severe recurrent major depression with psychotic features (HCC) 07/25/2024   Muscle pain 07/13/2023   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/01/2023   Abscess of finger 01/31/2023   Hypokalemia 01/31/2023   Paranoid delusion (HCC) 01/31/2023   History of hepatitis C 01/31/2023   Toxic encephalopathy 01/10/2023   MDD (major depressive disorder), recurrent episode, severe (HCC) 12/22/2021   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) 12/18/2021   Left wrist pain 08/27/2021   Bronchospasm 02/18/2021   COVID-19 vaccine dose declined 12/20/2020   Hepatitis C test positive 12/20/2020   Prolactin increased 12/20/2020   Advance care planning 12/20/2020   GAD (generalized anxiety disorder) 04/04/2020   Attention deficit  hyperactivity disorder, combined type 04/04/2020   Opioid type dependence, continuous (HCC) 04/04/2020   Major depressive disorder 07/14/2019   Alcohol  abuse 07/14/2019   Cocaine abuse (HCC) 07/12/2019   Low back pain 11/28/2017   NICM (nonischemic cardiomyopathy) (HCC) 11/28/2017   Renal stone 11/28/2017   History of seizure 03/14/2017   IVDU (intravenous drug user) 03/14/2017   Other specified abnormal immunological findings in serum 12/21/2015    Past Surgical History:  Procedure Laterality Date   APPENDECTOMY     EYE SURGERY     secondary to dog bite   I & D EXTREMITY Right 01/31/2023   Procedure: IRRIGATION AND DEBRIDEMENT INDEX FINGER;  Surgeon: Murrell Drivers, MD;  Location: [facility];  Service: Orthopedics;  Laterality: Right;   TIBIA FRACTURE SURGERY Right        Home Medications    Prior to Admission medications   Medication Sig Start Date End Date Taking? Authorizing Provider  buPROPion  (WELLBUTRIN  XL) 150 MG 24 hr tablet Take 1 tablet (150 mg total) by mouth daily. 07/30/24  Yes Dasie Ellouise CROME, FNP  clindamycin  (CLEOCIN ) 300 MG capsule Take 1 capsule (300 mg total) by mouth 3 (three) times daily for 7 days. 08/21/24 08/28/24 Yes Vonna Sharlet POUR, MD  ketorolac  (TORADOL ) 10 MG tablet Take 1 tablet (10 mg total) by mouth every 6 (six) hours as needed (pain).  08/21/24  Yes Vonna Sharlet POUR, MD  lamoTRIgine  (LAMICTAL ) 200 MG tablet Take 1 tablet (200 mg total) by mouth daily. 07/29/24  Yes Dasie Ellouise CROME, FNP  lurasidone (LATUDA) 40 MG TABS tablet Take 1 tablet (40 mg total) by mouth every evening. 07/29/24  Yes Dasie Ellouise CROME, FNP  traZODone  (DESYREL ) 50 MG tablet Take 1 tablet (50 mg total) by mouth at bedtime as needed for sleep. 07/29/24  Yes Dasie Ellouise CROME, FNP  pantoprazole  (PROTONIX ) 40 MG tablet Take 1 tablet (40 mg total) by mouth daily. 07/17/19 08/03/19  Clapacs, Norleen DASEN, MD    Family History Family History  Problem Relation Age of Onset   Alcohol  abuse Father     Melanoma Mother    Breast cancer Mother    Colon cancer Neg Hx    Prostate cancer Neg Hx     Social History Social History   Tobacco Use   Smoking status: Former   Smokeless tobacco: Never  Vaping Use   Vaping status: Every Day  Substance Use Topics   Alcohol  use: No   Drug use: Not Currently    Types: IV, Cocaine    Comment: hx of heroin abuse (opioid addiction), crack     Allergies   Amoxicillin and Prednisone    Review of Systems Review of Systems   Physical Exam Triage Vital Signs ED Triage Vitals  Encounter Vitals Group     BP 08/21/24 1720 110/74     Girls Systolic BP Percentile --      Girls Diastolic BP Percentile --      Boys Systolic BP Percentile --      Boys Diastolic BP Percentile --      Pulse Rate 08/21/24 1720 75     Resp 08/21/24 1720 20     Temp 08/21/24 1722 98.7 F (37.1 C)     Temp Source 08/21/24 1722 Oral     SpO2 08/21/24 1720 97 %     Weight --      Height --      Head Circumference --      Peak Flow --      Pain Score 08/21/24 1722 8     Pain Loc --      Pain Education --      Exclude from Growth Chart --    No data found.  Updated Vital Signs BP 110/74 (BP Location: Left Arm)   Pulse 75   Temp 98.7 F (37.1 C) (Oral)   Resp 20   SpO2 97%   Visual Acuity Right Eye Distance:   Left Eye Distance:   Bilateral Distance:    Right Eye Near:   Left Eye Near:    Bilateral Near:     Physical Exam Vitals reviewed.  Constitutional:      General: He is not in acute distress.    Appearance: He is not ill-appearing, toxic-appearing or diaphoretic.  Skin:    Coloration: Skin is not pale.     Comments: There is an area of erythema and induration, about 4 cm in diameter in total. No definite fluctuance, but there is an eschar type area about 3 mm in diameter on the lateral/lower part, with a little yellow discoloration (about 2 mm wide) in a halo around the eschar  Neurological:     Mental Status: [name] to  person, place, and time.  Psychiatric:        Behavior: Behavior normal.  UC Treatments / Results  Labs (all labs ordered are listed, but only abnormal results are displayed) Labs Reviewed - No data to display  EKG   Radiology No results found.  Procedures Procedures (including critical care time)  Medications Ordered in UC Medications  ketorolac  (TORADOL ) 30 MG/ML injection 30 mg (30 mg Intramuscular Given 08/21/24 1806)  cefTRIAXone  (ROCEPHIN ) injection 1 g (1 g Intramuscular Given 08/21/24 1806)    Initial Impression / Assessment and Plan / UC Course  I have reviewed the triage vital signs and the nursing notes.  Pertinent labs & imaging results that were available during my care of the patient were reviewed by me and considered in my medical decision making (see chart for details).   R/b of attempt to drain the yellow area were discussed, including inadequate anesthesia or no pus obtained. He gave verbal consent.   Pain ease spray used and under clean conditions, #18 gauge syringe needle used to make puncture wound in yellow halo area. No pus obtained; small amount of blood came out. Hemostasis achieved, and gauze and tape applied to the area.  He is a resident of [address] and can only use [facility], which is closed until AM of 11/17 Monday (tonight is Saturday).  Rocephin  1 gm IM given here (review of the chart shows he has tolerated cephalosporins in the past). Toradol  injection given for pain and toradol  tabs sent to the pharmacy. Clindamycin  is sent to the [place] pharmacy for him to begin 11/17.  Final Clinical Impressions(s) / UC Diagnoses   Final diagnoses:  Cellulitis of chest wall     Discharge Instructions      You have been given a shot of Toradol  30 mg today.  Ketorolac  10 mg tablets--take 1 tablet every 6 hours as needed for pain.  This is the same medicine that is in the shot we just gave you  You have been given a shot of ceftriaxone   1000 mg, an antibiotic.   Stop taking the sulfa  antibiotic as you are not improving on it.   --Take clindamycin  300 mg-- 1 capsule 3 times daily for 7 days. Start this as soon as you can.  If you worsen in anyway, please consider going to the ER for further evaluation and treatment     ED Prescriptions     Medication Sig Dispense Auth. Provider   clindamycin  (CLEOCIN ) 300 MG capsule Take 1 capsule (300 mg total) by mouth 3 (three) times daily for 7 days. 21 capsule Vonna Sharlet POUR, MD   ketorolac  (TORADOL ) 10 MG tablet Take 1 tablet (10 mg total) by mouth every 6 (six) hours as needed (pain). 20 tablet Kiesha Ensey K, MD      PDMP not reviewed this encounter.   Vonna Sharlet POUR, MD 08/21/24 [phone]

## 2024-08-23 ENCOUNTER — Encounter (HOSPITAL_COMMUNITY): Payer: Self-pay | Admitting: Psychiatry

## 2024-08-23 ENCOUNTER — Ambulatory Visit (INDEPENDENT_AMBULATORY_CARE_PROVIDER_SITE_OTHER): Payer: MEDICAID | Admitting: Psychiatry

## 2024-08-23 VITALS — BP 120/63 | HR 64 | Temp 97.5°F | Wt 195.0 lb

## 2024-08-23 DIAGNOSIS — R41840 Attention and concentration deficit: Secondary | ICD-10-CM | POA: Diagnosis not present

## 2024-08-23 DIAGNOSIS — F411 Generalized anxiety disorder: Secondary | ICD-10-CM | POA: Diagnosis not present

## 2024-08-23 DIAGNOSIS — F3130 Bipolar disorder, current episode depressed, mild or moderate severity, unspecified: Secondary | ICD-10-CM

## 2024-08-23 DIAGNOSIS — Z00129 Encounter for routine child health examination without abnormal findings: Secondary | ICD-10-CM

## 2024-08-23 MED ORDER — LAMOTRIGINE 200 MG PO TABS: 200.0000 mg | ORAL_TABLET | Freq: Every day | ORAL | 3 refills | Status: DC

## 2024-08-23 MED ORDER — GUANFACINE HCL ER 1 MG PO TB24: 1.0000 mg | ORAL_TABLET | Freq: Every day | ORAL | 3 refills | Status: DC

## 2024-08-23 MED ORDER — LURASIDONE HCL 80 MG PO TABS: 80.0000 mg | ORAL_TABLET | Freq: Every evening | ORAL | 3 refills | Status: DC

## 2024-08-23 MED ORDER — MIRTAZAPINE 15 MG PO TABS: 15.0000 mg | ORAL_TABLET | Freq: Every day | ORAL | 3 refills | Status: DC

## 2024-08-23 MED ORDER — BUPROPION HCL ER (XL) 150 MG PO TB24: 150.0000 mg | ORAL_TABLET | Freq: Every day | ORAL | 3 refills | Status: DC
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
## 2024-08-27 ENCOUNTER — Encounter (HOSPITAL_COMMUNITY): Payer: Self-pay

## 2024-08-27 ENCOUNTER — Emergency Department (HOSPITAL_COMMUNITY): Payer: MEDICAID

## 2024-08-27 ENCOUNTER — Emergency Department (HOSPITAL_COMMUNITY)
Admission: EM | Admit: 2024-08-27 | Discharge: 2024-08-27 | Disposition: A | Discharge status: Home / Self Care | Payer: MEDICAID | Attending: Emergency Medicine | Admitting: Emergency Medicine

## 2024-08-27 ENCOUNTER — Ambulatory Visit (HOSPITAL_COMMUNITY)
Admission: EM | Admit: 2024-08-27 | Discharge: 2024-08-27 | Disposition: A | Discharge status: Home / Self Care | Payer: MEDICAID

## 2024-08-27 DIAGNOSIS — N644 Mastodynia: Secondary | ICD-10-CM | POA: Diagnosis present

## 2024-08-27 DIAGNOSIS — N61 Mastitis without abscess: Secondary | ICD-10-CM

## 2024-08-27 DIAGNOSIS — L03313 Cellulitis of chest wall: Secondary | ICD-10-CM

## 2024-08-27 DIAGNOSIS — R531 Weakness: Secondary | ICD-10-CM | POA: Diagnosis not present

## 2024-08-27 LAB — BASIC METABOLIC PANEL WITH GFR
Anion gap: 8 (ref 5–15)
BUN: 15 mg/dL (ref 6–20)
CO2: 24 mmol/L (ref 22–32)
Calcium: 9.2 mg/dL (ref 8.9–10.3)
Chloride: 104 mmol/L (ref 98–111)
Creatinine, Ser: 0.95 mg/dL (ref 0.61–1.24)
GFR, Estimated: >60 mL/min (ref >60)
Glucose, Bld: 86 mg/dL (ref 70–99)
Potassium: 4.2 mmol/L (ref 3.5–5.1)
Sodium: 136 mmol/L (ref 135–145)

## 2024-08-27 LAB — CBC WITH DIFFERENTIAL/PLATELET
Abs Immature Granulocytes: 0.02 K/uL (ref 0.00–0.07)
Basophils Absolute: 0.1 K/uL (ref 0.0–0.1)
Basophils Relative: 1 %
Eosinophils Absolute: 0.1 K/uL (ref 0.0–0.5)
Eosinophils Relative: 2 %
HCT: 42 % (ref 39.0–52.0)
Hemoglobin: 13.8 g/dL (ref 13.0–17.0)
Immature Granulocytes: 0 %
Lymphocytes Relative: 35 %
Lymphs Abs: 1.7 K/uL (ref 0.7–4.0)
MCH: 30.4 pg (ref 26.0–34.0)
MCHC: 32.9 g/dL (ref 30.0–36.0)
MCV: 92.5 fL (ref 80.0–100.0)
Monocytes Absolute: 0.6 K/uL (ref 0.1–1.0)
Monocytes Relative: 11 %
Neutro Abs: 2.5 K/uL (ref 1.7–7.7)
Neutrophils Relative %: 51 %
Platelets: 323 K/uL (ref 150–400)
RBC: 4.54 MIL/uL (ref 4.22–5.81)
RDW: 13.2 % (ref 11.5–15.5)
WBC: 5 K/uL (ref 4.0–10.5)
nRBC: 0 % (ref 0.0–0.2)

## 2024-08-27 MED ORDER — CEPHALEXIN 500 MG PO CAPS: 500.0000 mg | ORAL_CAPSULE | Freq: Three times a day (TID) | ORAL | 0 refills | Status: DC

## 2024-08-27 MED ORDER — SULFAMETHOXAZOLE-TRIMETHOPRIM 800-160 MG PO TABS: 1.0000 | ORAL_TABLET | Freq: Two times a day (BID) | ORAL | 0 refills | Status: AC

## 2024-08-27 MED ORDER — TRAMADOL HCL 50 MG PO TABS: 50.0000 mg | ORAL_TABLET | Freq: Four times a day (QID) | ORAL | 0 refills | Status: DC | PRN

## 2024-08-27 MED ORDER — TRAMADOL HCL 50 MG PO TABS
50.0000 mg | ORAL_TABLET | Freq: Once | ORAL | Status: AC
Start: 2024-08-27 — End: 2024-08-27
  Administered 2024-08-27: 50 mg via ORAL
  Filled 2024-08-27: qty 1

## 2024-08-27 MED ORDER — SULFAMETHOXAZOLE-TRIMETHOPRIM 800-160 MG PO TABS
1.0000 | ORAL_TABLET | Freq: Once | ORAL | Status: AC
  Administered 2024-08-27: 1 via ORAL
  Filled 2024-08-27: qty 1

## 2024-08-27 MED ORDER — CEPHALEXIN 250 MG PO CAPS
500.0000 mg | ORAL_CAPSULE | Freq: Once | ORAL | Status: AC
  Administered 2024-08-27: 500 mg via ORAL
  Filled 2024-08-27: qty 2

## 2024-08-27 NOTE — ED Triage Notes (Signed)
 Pt c/o abscess on L breast x1 week.  Pt has been seen by UC for same and has been taking an antibiotic w/o relief.  Pain score 5/10.

## 2024-08-27 NOTE — Discharge Instructions (Signed)
 As we discussed, you have cellulitis and a small abscess.  I recommend we switch you to Keflex  and Bactrim  and stop clindamycin  for now  Please try warm compresses  Take Tylenol  or Toradol  as prescribed by your doctor  I have prescribed tramadol  for severe pain  Please follow-up with your doctor  Return to ER if you have fever or purulent drainage or severe pain

## 2024-08-27 NOTE — ED Provider Notes (Signed)
 [place] EMERGENCY DEPARTMENT AT [facility] Provider Note   CSN: [id number] Arrival date & time: 08/27/24  9146     History Chief Complaint  Patient presents with   Abscess   Wound Infection    HPI: Randall Parsons is a 43 y.o. male with no pertinent history who presents complaining of left nipple pain. Patient arrived via POV.  History provided by patient.  No interpreter required during this encounter.  Patient reports that he recently had redness and pain at his left nipple.  Reports that he recently went to urgent care, and underwent a needle aspiration when prescribed clindamycin .  Reports that after the needle aspiration he did have purulent discharge for several days.  Reports that he has 1 day of clindamycin  left, however he has had persistent pain, therefore he is concerned that he has worsening infection and wanted to come to the emergency department for further evaluation.  Patient's recorded medical, surgical, social, medication list and allergies were reviewed in the Snapshot window as part of the initial history.   Prior to Admission medications   Medication Sig Start Date End Date Taking? Authorizing Provider  cephALEXin  (KEFLEX ) 500 MG capsule Take 1 capsule (500 mg total) by mouth 3 (three) times daily. 08/27/24  Yes Patt Alm Macho, MD  sulfamethoxazole -trimethoprim  (BACTRIM  DS) 800-160 MG tablet Take 1 tablet by mouth 2 (two) times daily for 7 days. 08/27/24 09/03/24 Yes Patt Alm Macho, MD  traMADol  (ULTRAM ) 50 MG tablet Take 1 tablet (50 mg total) by mouth every 6 (six) hours as needed. 08/27/24  Yes Patt Alm Macho, MD  buPROPion  (WELLBUTRIN  XL) 150 MG 24 hr tablet Take 1 tablet (150 mg total) by mouth daily. 08/23/24   Harl Zane BRAVO, NP  guanFACINE  (INTUNIV ) 1 MG TB24 ER tablet Take 1 tablet (1 mg total) by mouth daily. 08/23/24   Harl Zane BRAVO, NP  ketorolac  (TORADOL ) 10 MG tablet Take 1 tablet (10 mg total) by mouth every 6  (six) hours as needed (pain). 08/21/24   Vonna Sharlet POUR, MD  lamoTRIgine  (LAMICTAL ) 200 MG tablet Take 1 tablet (200 mg total) by mouth daily. 08/23/24   Harl Zane BRAVO, NP  lurasidone  (LATUDA ) 80 MG TABS tablet Take 1 tablet (80 mg total) by mouth every evening. 08/23/24   Harl Zane BRAVO, NP  mirtazapine  (REMERON ) 15 MG tablet Take 1 tablet (15 mg total) by mouth at bedtime. 08/23/24   Harl Zane BRAVO, NP  pantoprazole  (PROTONIX ) 40 MG tablet Take 1 tablet (40 mg total) by mouth daily. 07/17/19 08/03/19  Clapacs, Norleen DASEN, MD     Allergies: Amoxicillin and Prednisone    Review of Systems   ROS as per HPI  Physical Exam Updated Vital Signs BP 124/89 (BP Location: Right Arm)   Pulse 65   Temp 98 F (36.7 C) (Oral)   Resp 16   Ht 5' 5 (1.651 m)   Wt 88.5 kg   SpO2 98%   BMI 32.45 kg/m  Physical Exam Vitals and nursing note reviewed.  Constitutional:      General: He is not in acute distress.    Appearance: He is well-developed.  HENT:     Head: Normocephalic and atraumatic.  Eyes:     Conjunctiva/sclera: Conjunctivae normal.  Cardiovascular:     Rate and Rhythm: Normal rate and regular rhythm.     Heart sounds: No murmur heard. Pulmonary:     Effort: Pulmonary effort is normal. No respiratory distress.  Breath sounds: Normal breath sounds.  Chest:     Comments: Mild erythema and induration to the 11 o'clock position about the left areola without purulence or fluctuance Abdominal:     Palpations: Abdomen is soft.     Tenderness: There is no abdominal tenderness.  Musculoskeletal:        General: No swelling.     Cervical back: Neck supple.  Skin:    General: Skin is warm and dry.     Capillary Refill: Capillary refill takes less than 2 seconds.  Neurological:     Mental Status: He is alert.  Psychiatric:        Mood and Affect: Mood normal.     ED Course/ Medical Decision Making/ A&P    Procedures Procedures   Medications Ordered in  ED Medications  sulfamethoxazole -trimethoprim  (BACTRIM  DS) 800-160 MG per tablet 1 tablet (1 tablet Oral Given 08/27/24 1539)  cephALEXin  (KEFLEX ) capsule 500 mg (500 mg Oral Given 08/27/24 1538)  traMADol  (ULTRAM ) tablet 50 mg (50 mg Oral Given 08/27/24 1539)    Medical Decision Making:   Randall Parsons is a 43 y.o. male who presents for left nipple pain and redness as per above.  Physical exam is pertinent for mild erythema and induration to the 11 o'clock position about the left areola without purulence or fluctuance.   The differential includes but is not limited to cellulitis, abscess, sepsis.  Independent historian: None  External data reviewed: No pertinent external data  Initial Plan:  Screening labs including CBC and Metabolic panel to evaluate for infectious or metabolic etiology of disease.  Ultrasound of the chest to evaluate for abscess  Labs: Ordered, Independent interpretation, and Details: CBC without leukocytosis, anemia, thrombocytopenia.  BMP without AKI or emergent electrolyte derangement.  Radiology: Ordered and Independent interpretation not yet obtained by the time of handoff US  CHEST SOFT TISSUE Result Date: 08/27/2024 EXAM: US  Chest Limited 08/27/2024 02:57:00 PM TECHNIQUE: Real-time ultrasound of the chest to evaluate for possible abscess COMPARISON: None. CLINICAL HISTORY: Cellulitis of the left breast FINDINGS: SOFT TISSUES: In the subcutaneous soft tissues of the left breast, there is a predominantly hypoechoic area of incompressible tissue measuring 1.2 x 0.3 x 1.3 cm. No significant vascularity is noted. Some minimal fluid is noted within. This likely represents a small subcutaneous abscess with surrounding edema. No drainable fluid collection is noted. SABRA IMPRESSION: 1. Left breast cellulitis with a small subcutaneous abscess (1.2 x 0.3 x 1.3 cm) and surrounding edema. No drainable fluid collection. Electronically signed by: Oneil Devonshire MD 08/27/2024  03:16 PM EST RP Workstation: [id number]    EKG/Medicine tests: Not indicated EKG Interpretation:    Interventions: None by the time of handoff  See the EMR for full details regarding lab and imaging results.  Patient presents to the emergency department for left nipple pain in the setting of recent infection.  Patient does describe partial improvement given he previously had purulent discharge which has resolved, however patient does have clinical evidence of cellulitis with erythema and induration.  Screening labs obtained and reassuring, given no leukocytosis, patient afebrile, hemodynamically stable, doubt sepsis.  Given complex anatomy of nipple and breast tissue, do feel that patient warrants formal ultrasound rather than POCUS to evaluate for deeper abscess, to further guide whether or not patient requires additional I&D or needle aspiration.  At the time of signout, follow-up ultrasound, consider needle aspiration if indicated versus prolongation of antibiotics for cellulitis given patient reports that his last day of clindamycin   is tomorrow.  Presentation is most consistent with acute complicated illness  Discussion of management or test interpretations with external provider(s): None by the time of handoff.  Risk Drugs:None Treatment: Pending at the time of handoff  Disposition: HANDOFF: At the time of signout, the patients ultrasound had not yet been completed. I transferred care of the patient at the time of signout to Dr. Patt. I informed the incoming care provider of the patient's history, status, and management plan. I addressed all of their concerns and/or questions to the best of my ability. Please refer to the incoming care provider's note for details regarding the remainder of the patient's ED course and disposition.  MDM generated using voice dictation software and may contain dictation errors.  Please contact me for any clarification or with any questions.  Clinical  Impression:  1. Cellulitis of breast of male      Discharge   Final Clinical Impression(s) / ED Diagnoses Final diagnoses:  Cellulitis of breast of male    Rx / DC Orders ED Discharge Orders          Ordered    cephALEXin  (KEFLEX ) 500 MG capsule  3 times daily        08/27/24 1531    sulfamethoxazole -trimethoprim  (BACTRIM  DS) 800-160 MG tablet  2 times daily        08/27/24 1531    traMADol  (ULTRAM ) 50 MG tablet  Every 6 hours PRN        08/27/24 1535             Rogelia Jerilynn RAMAN, MD 09/01/24 0134

## 2024-08-27 NOTE — Discharge Instructions (Signed)
 Please go to the ER for further evaluation

## 2024-08-27 NOTE — ED Triage Notes (Signed)
 Patient states he is here for a left chest abscess follow up.patient states he feels weak in general.  Patient states he has been taking antibiotics and ibuprofen  as ordered. Patient states the abscess and pain have not improved.

## 2024-08-27 NOTE — ED Provider Triage Note (Signed)
 Emergency Medicine Provider Triage Evaluation Note  Randall Parsons , a 43 y.o. male  was evaluated in triage.  Pt complains of persistent pain and infection near his left breast.  Review of Systems  Positive: Pain, erythema, general malaise and fatigue now, tenderness, skin changes, recent drainage Negative: Fevers, chills, shortness of breath, nausea, vomiting, constipation, diarrhea, urinary tract  Physical Exam  BP 120/80 (BP Location: Right Arm)   Pulse 67   Temp 98.7 F (37.1 C) (Oral)   Resp 15   Ht 5' 5 (1.651 m)   Wt 88.5 kg   SpO2 97%   BMI 32.45 kg/m  Gen:   Awake, no distress   Resp:  Normal effort  MSK:   Moves extremities without difficulty, tenderness without significant fluctuance just superior to his left areola and nipple.  Some scabbing from recent incision and drainage but no crepitance.  Some surrounding erythema and tenderness. Other:  Lungs clear.  Patient otherwise well-appearing  Medical Decision Making  Medically screening exam initiated at 9:18 AM.  Appropriate orders placed.  Randall Parsons was informed that the remainder of the evaluation will be completed by another provider, this initial triage assessment does not replace that evaluation, and the importance of remaining in the ED until their evaluation is complete.  Randall Parsons is a 43 y.o. male with a past medical history significant for CHF, hypertension, asthma, previous stroke, previous polysubstance abuse, kidney stones, depression, and anxiety who presents with left chest infection.  According to patient, for the last week or so he has been having pain and redness in swelling to his left chest wall just superior to his left areola and nipple area.  He has been 3 times to the urgent care and initially was on Bactrim  and then will switch to clindamycin .  There was an incision and drainage performed but no significant purulence was removed.  He does report some drainage from it and  then it scabbed.  He reports he went again today for a wound evaluation and they told him to come the emergency department given his symptoms.  He does report he had some general malaise and fatigue and says he is feeling crummy overall.  He otherwise denies documented fevers or chills.  He denies nausea, vomiting, constipation, diarrhea, or urinary changes.  He is worried that the antibiotics are not working and seems to be worsening in regards to pain.  He denies any actual drainage of purulence from the nipple itself and the pain is not on the areola.  He reports that the redness and pain is superior to it and seems to be worsening.  On my exam, lungs clear.   patient does have chest wall tenderness just superior and medial to the left nipple and areola with some crusting and induration.  I do not appreciate significant fluctuance.  There was erythema extending from the site.  Rest of exam unremarkable.  He does have an abrasion on his left hand knuckle that he is not worried about.  As the patient reports he is now having some systemic symptoms of illness with some malaise and fatigue we will get some basic labs however on my exam I did not feel clear abscess at this time.  There is some induration and tenderness and erythema and he may still have some cellulitis.  Will get some basic labs and he will need evaluation by another provider to determine if there is any other imaging or workup needing to  be done.  Patient agrees with this plan.  Anticipate reassessment when he gets to a room.     Michall Noffke, Lonni PARAS, MD 08/27/24 0930

## 2024-08-27 NOTE — ED Provider Notes (Signed)
  Physical Exam  BP 126/75 (BP Location: Left Arm)   Pulse 64   Temp 97.8 F (36.6 C)   Resp 17   Ht 5' 5 (1.651 m)   Wt 88.5 kg   SpO2 98%   BMI 32.45 kg/m   Physical Exam  Procedures  Procedures  ED Course / MDM    Medical Decision Making Care assumed at 3 PM.  Patient is here with swelling over the left breast.  Patient had a I&D by urgent care about a week ago and is on clindamycin .  Signed out pending ultrasound results of the chest  3:34 PM White blood cell count is normal.  Ultrasound showed left breast cellulitis with small abscess.  I reviewed the ultrasound images and is very small and does not require repeat I&D.  I recommend switching to Keflex  and Bactrim .  Stable for discharge  Problems Addressed: Cellulitis of breast of male: acute illness or injury  Amount and/or Complexity of Data Reviewed Radiology: ordered.  Risk Prescription drug management.          Patt Alm Macho, MD 08/27/24 [phone]

## 2024-08-27 NOTE — ED Provider Notes (Signed)
 [facility]    CSN: [id number] Arrival date & time: 08/27/24  9196      History   Chief Complaint Chief Complaint  Patient presents with   Follow-up    HPI Randall Parsons is a 43 y.o. male.   Patient presents for follow-up of abscess to his left chest wall.  Patient was initially seen on 11/13 for this and was prescribed Bactrim  at that time.  Patient returned on 11/14 because the area was not improving and an I&D was performed and patient was placed on clindamycin  and given an injection of Rocephin .  Patient states that he felt like it was initially improving, but then the area became larger and more painful over the last few days.  Patient also now endorses some generalized weakness.  Denies known fever, body aches, and chills.  The history is provided by the patient and medical records.    Past Medical History:  Diagnosis Date   Anxiety    Asthma    CHF (congestive heart failure) (HCC)    Depression    Exposure to hepatitis B    Exposure to hepatitis C    Hepatitis C    History of kidney stones    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, ending around 2017   Hypertension    IV drug abuse (HCC)    Myocardial infarction (HCC)    Renal disorder    kidney stones   Stroke [facility])     Patient Active Problem List   Diagnosis Date Noted   Severe recurrent major depression with psychotic features (HCC) 07/25/2024   Muscle pain 07/13/2023   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/01/2023   Abscess of finger 01/31/2023   Hypokalemia 01/31/2023   Paranoid delusion (HCC) 01/31/2023   History of hepatitis C 01/31/2023   Toxic encephalopathy 01/10/2023   MDD (major depressive disorder), recurrent episode, severe (HCC) 12/22/2021   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) 12/18/2021   Left wrist pain 08/27/2021   Bronchospasm 02/18/2021   COVID-19 vaccine dose declined 12/20/2020    Hepatitis C test positive 12/20/2020   Prolactin increased 12/20/2020   Advance care planning 12/20/2020   GAD (generalized anxiety disorder) 04/04/2020   Attention deficit hyperactivity disorder, combined type 04/04/2020   Opioid type dependence, continuous (HCC) 04/04/2020   Major depressive disorder 07/14/2019   Alcohol  abuse 07/14/2019   Cocaine abuse (HCC) 07/12/2019   Low back pain 11/28/2017   NICM (nonischemic cardiomyopathy) (HCC) 11/28/2017   Renal stone 11/28/2017   History of seizure 03/14/2017   IVDU (intravenous drug user) 03/14/2017   Other specified abnormal immunological findings in serum 12/21/2015    Past Surgical History:  Procedure Laterality Date   APPENDECTOMY     EYE SURGERY     secondary to dog bite   I & D EXTREMITY Right 01/31/2023   Procedure: IRRIGATION AND DEBRIDEMENT INDEX FINGER;  Surgeon: Murrell Drivers, MD;  Location: [facility] OR;  Service: Orthopedics;  Laterality: Right;   TIBIA FRACTURE SURGERY Right        Home Medications    Prior to Admission medications   Medication Sig Start Date End Date Taking? Authorizing Provider  buPROPion  (WELLBUTRIN  XL) 150 MG 24 hr tablet Take 1 tablet (150 mg total) by mouth daily. 08/23/24   Harl Zane BRAVO, NP  clindamycin  (CLEOCIN ) 300 MG capsule Take 1 capsule (300 mg total) by mouth 3 (three) times daily for  7 days. 08/21/24 08/28/24  Vonna Sharlet POUR, MD  guanFACINE  (INTUNIV ) 1 MG TB24 ER tablet Take 1 tablet (1 mg total) by mouth daily. 08/23/24   Harl Zane BRAVO, NP  ketorolac  (TORADOL ) 10 MG tablet Take 1 tablet (10 mg total) by mouth every 6 (six) hours as needed (pain). 08/21/24   Vonna Sharlet POUR, MD  lamoTRIgine  (LAMICTAL ) 200 MG tablet Take 1 tablet (200 mg total) by mouth daily. 08/23/24   Harl Zane BRAVO, NP  lurasidone  (LATUDA ) 80 MG TABS tablet Take 1 tablet (80 mg total) by mouth every evening. 08/23/24   Harl Zane BRAVO, NP  mirtazapine  (REMERON ) 15 MG tablet Take 1 tablet (15 mg  total) by mouth at bedtime. 08/23/24   Harl Zane BRAVO, NP  pantoprazole  (PROTONIX ) 40 MG tablet Take 1 tablet (40 mg total) by mouth daily. 07/17/19 08/03/19  Clapacs, Norleen DASEN, MD    Family History Family History  Problem Relation Age of Onset   Alcohol  abuse Father    Melanoma Mother    Breast cancer Mother    Colon cancer Neg Hx    Prostate cancer Neg Hx     Social History Social History   Tobacco Use   Smoking status: Former   Smokeless tobacco: Never  Vaping Use   Vaping status: Every Day  Substance Use Topics   Alcohol  use: No   Drug use: Not Currently    Types: IV, Cocaine    Comment: hx of heroin abuse (opioid addiction), crack     Allergies   Amoxicillin and Prednisone    Review of Systems Review of Systems  Per HPI  Physical Exam Triage Vital Signs ED Triage Vitals  Encounter Vitals Group     BP 08/27/24 0829 114/78     Girls Systolic BP Percentile --      Girls Diastolic BP Percentile --      Boys Systolic BP Percentile --      Boys Diastolic BP Percentile --      Pulse Rate 08/27/24 0829 73     Resp 08/27/24 0829 16     Temp 08/27/24 0829 (!) 97.5 F (36.4 C)     Temp Source 08/27/24 0829 Oral     SpO2 08/27/24 0829 95 %     Weight --      Height --      Head Circumference --      Peak Flow --      Pain Score 08/27/24 0830 9     Pain Loc --      Pain Education --      Exclude from Growth Chart --    No data found.  Updated Vital Signs BP 114/78 (BP Location: Right Arm)   Pulse 73   Temp (!) 97.5 F (36.4 C) (Oral)   Resp 16   SpO2 95%   Visual Acuity Right Eye Distance:   Left Eye Distance:   Bilateral Distance:    Right Eye Near:   Left Eye Near:    Bilateral Near:     Physical Exam Vitals and nursing note reviewed.  Constitutional:      General: He is awake. He is not in acute distress.    Appearance: Normal appearance. He is well-developed and well-groomed. He is not ill-appearing.  Chest:    Skin:    General:  Skin is warm and dry.     Findings: Abscess and erythema present.     Comments: Approximately 3 x 2 cm indurated  erythematous abscess noted to left chest wall just above the areola.  There is no drainage at this time.  Neurological:     Mental Status: He is alert.  Psychiatric:        Behavior: Behavior is cooperative.      UC Treatments / Results  Labs (all labs ordered are listed, but only abnormal results are displayed) Labs Reviewed - No data to display  EKG   Radiology No results found.  Procedures Procedures (including critical care time)  Medications Ordered in UC Medications - No data to display  Initial Impression / Assessment and Plan / UC Course  I have reviewed the triage vital signs and the nursing notes.  Pertinent labs & imaging results that were available during my care of the patient were reviewed by me and considered in my medical decision making (see chart for details).     Patient is overall well-appearing.  Vitals are stable.  Recommended patient be seen in the emergency department for further evaluation and management due to already failing outpatient antibiotics twice and now having generalized weakness.  Patient is understanding and agreeable to plan at this time.  Patient is stable to arrive to the ER via POV. Final Clinical Impressions(s) / UC Diagnoses   Final diagnoses:  Cellulitis of chest wall  Generalized weakness     Discharge Instructions      Please go to the ER for further evaluation   ED Prescriptions   None    PDMP not reviewed this encounter.   Johnie Flaming A, NP 08/27/24 [phone]

## 2024-08-27 NOTE — ED Notes (Signed)
 Patient is being discharged from the Urgent Care and sent to the Emergency Department via POV . Per Rumaldo Ryder, NP, patient is in need of higher level of care due to cellulitis. Patient is aware and verbalizes understanding of plan of care.  Vitals:   08/27/24 0829  BP: 114/78  Pulse: 73  Resp: 16  Temp: (!) 97.5 F (36.4 C)  SpO2: 95%

## 2024-08-27 NOTE — ED Notes (Signed)
 ED Provider at bedside.

## 2024-09-17 ENCOUNTER — Encounter (HOSPITAL_COMMUNITY): Payer: Self-pay

## 2024-09-17 ENCOUNTER — Ambulatory Visit (HOSPITAL_COMMUNITY)
Admission: EM | Admit: 2024-09-17 | Discharge: 2024-09-17 | Disposition: A | Discharge status: Home / Self Care | Payer: MEDICAID

## 2024-09-17 DIAGNOSIS — B349 Viral infection, unspecified: Secondary | ICD-10-CM | POA: Diagnosis not present

## 2024-09-17 LAB — POCT INFLUENZA A/B
Influenza A, POC: NEGATIVE
Influenza B, POC: NEGATIVE

## 2024-09-17 LAB — SARS CORONAVIRUS 2 BY RT PCR: SARS Coronavirus 2 by RT PCR: NEGATIVE

## 2024-09-17 MED ORDER — IBUPROFEN 800 MG PO TABS: 800.0000 mg | ORAL_TABLET | Freq: Three times a day (TID) | ORAL | 0 refills | Status: DC

## 2024-09-17 MED ORDER — LOPERAMIDE HCL 2 MG PO TABS: 2.0000 mg | ORAL_TABLET | Freq: Four times a day (QID) | ORAL | 0 refills | Status: DC | PRN

## 2024-09-17 NOTE — Discharge Instructions (Addendum)
°  1. Acute viral syndrome (Primary) - POC Influenza A/B completed in UC is negative for influenza A and B. - SARS Coronavirus 2 by RT PCR Anterior Nasal Swab collected in UC and sent to lab for further testing results should be available within the next 24 hours. - loperamide  (IMODIUM  A-D) 2 MG tablet; Take 1 tablet (2 mg total) by mouth 4 (four) times daily as needed for diarrhea or loose stools.  Dispense: 30 tablet; Refill: 0 - ibuprofen  (ADVIL ) 800 MG tablet; Take 1 tablet (800 mg total) by mouth 3 (three) times daily.  Dispense: 30 tablet; Refill: 0  -Continue to monitor symptoms for any change in severity if there is any escalation of current symptoms or development of new symptoms follow-up in ER for further evaluation and management.

## 2024-09-17 NOTE — ED Triage Notes (Signed)
 PT states he woke up with fatigue,diarrhea and generalized body aches. No fever or vomiting at this time.

## 2024-09-17 NOTE — ED Provider Notes (Signed)
 [facility] [place]  Note:  This document was prepared using Conservation officer, historic buildings and may include unintentional dictation errors.  MRN: [id number] DOB: 04-Oct-1981  Subjective:   Randall Parsons is a 43 y.o. male presenting for generalized fatigue, body aches, diarrhea since this morning.  Patient states that he woke up this morning with the symptoms with no known cause.  Denies any known sick contacts.  No fever, vomiting, nausea, abdominal pain, flank pain, weakness, dizziness, shortness of breath, chest pain.  Patient denies taking any over-the-counter medication to treat symptoms prior to arrival in urgent care.  Current Medications[1]   Allergies[2]  Past Medical History:  Diagnosis Date   Anxiety    Asthma    CHF (congestive heart failure) (HCC)    Depression    Exposure to hepatitis B    Exposure to hepatitis C    Hepatitis C    History of kidney stones    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, ending around 2017   Hypertension    IV drug abuse (HCC)    Myocardial infarction [facility])    Renal disorder    kidney stones   Stroke [facility])      Past Surgical History:  Procedure Laterality Date   APPENDECTOMY     EYE SURGERY     secondary to dog bite   I & D EXTREMITY Right 01/31/2023   Procedure: IRRIGATION AND DEBRIDEMENT INDEX FINGER;  Surgeon: Murrell Drivers, MD;  Location: [facility] OR;  Service: Orthopedics;  Laterality: Right;   TIBIA FRACTURE SURGERY Right     Family History  Problem Relation Age of Onset   Alcohol  abuse Father    Melanoma Mother    Breast cancer Mother    Colon cancer Neg Hx    Prostate cancer Neg Hx     Social History[3]  ROS Refer to HPI for ROS details.  Objective:    Vitals: BP 123/82 (BP Location: Left Arm)   Pulse 89   Temp 97.6 F (36.4 C) (Oral)   Resp 18   SpO2 97%   Physical Exam Vitals and nursing note reviewed.  Constitutional:      General: He is not in acute distress.     Appearance: Normal appearance. He is well-developed. He is not ill-appearing or toxic-appearing.  HENT:     Head: Normocephalic.     Nose: Nose normal.     Mouth/Throat:     Mouth: Mucous membranes are moist.     Pharynx: Oropharynx is clear.  Cardiovascular:     Rate and Rhythm: Normal rate.  Pulmonary:     Effort: Pulmonary effort is normal. No respiratory distress.     Breath sounds: No stridor. No wheezing.  Chest:     Chest wall: No tenderness.  Abdominal:     Tenderness: There is no abdominal tenderness. There is no right CVA tenderness or left CVA tenderness.  Skin:    General: Skin is warm and dry.  Neurological:     General: No focal deficit present.     Mental Status: [name] to person, place, and time.  Psychiatric:        Mood and Affect: Mood normal.        Behavior: Behavior normal.     Procedures  Results for orders placed or performed during the hospital encounter of 09/17/24 (from the past 24 hours)  POC Influenza A/B     Status: None  Collection Time: 09/17/24  9:26 AM  Result Value Ref Range   Influenza A, POC Negative Negative   Influenza B, POC Negative Negative    Assessment and Plan :     Discharge Instructions       1. Acute viral syndrome (Primary) - POC Influenza A/B completed in UC is negative for influenza A and B. - SARS Coronavirus 2 by RT PCR Anterior Nasal Swab collected in UC and sent to lab for further testing results should be available within the next 24 hours. - loperamide  (IMODIUM  A-D) 2 MG tablet; Take 1 tablet (2 mg total) by mouth 4 (four) times daily as needed for diarrhea or loose stools.  Dispense: 30 tablet; Refill: 0 - ibuprofen  (ADVIL ) 800 MG tablet; Take 1 tablet (800 mg total) by mouth 3 (three) times daily.  Dispense: 30 tablet; Refill: 0  -Continue to monitor symptoms for any change in severity if there is any escalation of current symptoms or development of new symptoms follow-up in ER for further  evaluation and management.      Malike Foglio B Avaline Stillson    [1] No current facility-administered medications for this encounter.  Current Outpatient Medications:    buPROPion  (WELLBUTRIN  XL) 150 MG 24 hr tablet, Take 1 tablet (150 mg total) by mouth daily., Disp: 30 tablet, Rfl: 3   guanFACINE  (INTUNIV ) 1 MG TB24 ER tablet, Take 1 tablet (1 mg total) by mouth daily., Disp: 30 tablet, Rfl: 3   lamoTRIgine  (LAMICTAL ) 200 MG tablet, Take 1 tablet (200 mg total) by mouth daily., Disp: 30 tablet, Rfl: 3   lurasidone  (LATUDA ) 80 MG TABS tablet, Take 1 tablet (80 mg total) by mouth every evening., Disp: 30 tablet, Rfl: 3   mirtazapine  (REMERON ) 15 MG tablet, Take 1 tablet (15 mg total) by mouth at bedtime., Disp: 30 tablet, Rfl: 3   traMADol  (ULTRAM ) 50 MG tablet, Take 1 tablet (50 mg total) by mouth every 6 (six) hours as needed., Disp: 12 tablet, Rfl: 0   ibuprofen  (ADVIL ) 800 MG tablet, Take 1 tablet (800 mg total) by mouth 3 (three) times daily., Disp: 30 tablet, Rfl: 0   loperamide  (IMODIUM  A-D) 2 MG tablet, Take 1 tablet (2 mg total) by mouth 4 (four) times daily as needed for diarrhea or loose stools., Disp: 30 tablet, Rfl: 0 [2]  Allergies Allergen Reactions   Amoxicillin Hives and Other (See Comments)    Did it involve swelling of the face/tongue/throat, SOB, or low BP? No Did it involve sudden or severe rash/hives, skin peeling, or any reaction on the inside of your mouth or nose? Yes Did you need to seek medical attention at a hospital or doctor's office? Yes When did it last happen?     44 yrs old If all above answers are NO, may proceed with cephalosporin use.    Prednisone  Other (See Comments)    irritable  [3]  Social History Tobacco Use   Smoking status: Former   Smokeless tobacco: Never  Vaping Use   Vaping status: Every Day  Substance Use Topics   Alcohol  use: No   Drug use: Not Currently    Types: IV, Cocaine    Comment: hx of heroin abuse (opioid addiction), crack      Jilliam Bellmore B, NP 09/17/24 1005

## 2024-10-06 ENCOUNTER — Telehealth (HOSPITAL_COMMUNITY): Payer: Self-pay

## 2024-10-06 ENCOUNTER — Other Ambulatory Visit (HOSPITAL_COMMUNITY)
Admission: EM | Admit: 2024-10-06 | Discharge: 2024-10-08 | Disposition: A | Discharge status: Home / Self Care | Payer: MEDICAID | Attending: Psychiatry | Admitting: Psychiatry

## 2024-10-06 ENCOUNTER — Ambulatory Visit (HOSPITAL_COMMUNITY): Payer: MEDICAID | Admitting: Clinical

## 2024-10-06 DIAGNOSIS — Z9151 Personal history of suicidal behavior: Secondary | ICD-10-CM | POA: Insufficient documentation

## 2024-10-06 DIAGNOSIS — R41840 Attention and concentration deficit: Secondary | ICD-10-CM | POA: Diagnosis not present

## 2024-10-06 DIAGNOSIS — R45851 Suicidal ideations: Secondary | ICD-10-CM | POA: Diagnosis not present

## 2024-10-06 DIAGNOSIS — R001 Bradycardia, unspecified: Secondary | ICD-10-CM | POA: Diagnosis not present

## 2024-10-06 DIAGNOSIS — F323 Major depressive disorder, single episode, severe with psychotic features: Secondary | ICD-10-CM | POA: Diagnosis present

## 2024-10-06 DIAGNOSIS — F3130 Bipolar disorder, current episode depressed, mild or moderate severity, unspecified: Secondary | ICD-10-CM

## 2024-10-06 DIAGNOSIS — G4700 Insomnia, unspecified: Secondary | ICD-10-CM | POA: Insufficient documentation

## 2024-10-06 DIAGNOSIS — F419 Anxiety disorder, unspecified: Secondary | ICD-10-CM | POA: Diagnosis not present

## 2024-10-06 DIAGNOSIS — F1911 Other psychoactive substance abuse, in remission: Secondary | ICD-10-CM | POA: Diagnosis not present

## 2024-10-06 DIAGNOSIS — Z79899 Other long term (current) drug therapy: Secondary | ICD-10-CM | POA: Diagnosis not present

## 2024-10-06 DIAGNOSIS — T50905A Adverse effect of unspecified drugs, medicaments and biological substances, initial encounter: Secondary | ICD-10-CM

## 2024-10-06 DIAGNOSIS — F1211 Cannabis abuse, in remission: Secondary | ICD-10-CM | POA: Diagnosis not present

## 2024-10-06 DIAGNOSIS — F333 Major depressive disorder, recurrent, severe with psychotic symptoms: Secondary | ICD-10-CM | POA: Insufficient documentation

## 2024-10-06 DIAGNOSIS — B349 Viral infection, unspecified: Secondary | ICD-10-CM | POA: Diagnosis not present

## 2024-10-06 DIAGNOSIS — G479 Sleep disorder, unspecified: Secondary | ICD-10-CM

## 2024-10-06 LAB — COMPREHENSIVE METABOLIC PANEL WITH GFR
ALT: 27 U/L (ref 0–44)
AST: 27 U/L (ref 15–41)
Albumin: 4.6 g/dL (ref 3.5–5.0)
Alkaline Phosphatase: 81 U/L (ref 38–126)
Anion gap: 10 (ref 5–15)
BUN: 15 mg/dL (ref 6–20)
CO2: 26 mmol/L (ref 22–32)
Calcium: 9.7 mg/dL (ref 8.9–10.3)
Chloride: 99 mmol/L (ref 98–111)
Creatinine, Ser: 1.15 mg/dL (ref 0.61–1.24)
GFR, Estimated: >60 mL/min
Glucose, Bld: 89 mg/dL (ref 70–99)
Potassium: 4.4 mmol/L (ref 3.5–5.1)
Sodium: 136 mmol/L (ref 135–145)
Total Bilirubin: 0.3 mg/dL (ref 0.0–1.2)
Total Protein: 7.8 g/dL (ref 6.5–8.1)

## 2024-10-06 LAB — CBC WITH DIFFERENTIAL/PLATELET
Abs Immature Granulocytes: 0.02 K/uL (ref 0.00–0.07)
Basophils Absolute: 0 K/uL (ref 0.0–0.1)
Basophils Relative: 1 %
Eosinophils Absolute: 0.2 K/uL (ref 0.0–0.5)
Eosinophils Relative: 4 %
HCT: 46.2 % (ref 39.0–52.0)
Hemoglobin: 15.1 g/dL (ref 13.0–17.0)
Immature Granulocytes: 0 %
Lymphocytes Relative: 37 %
Lymphs Abs: 2 K/uL (ref 0.7–4.0)
MCH: 30.1 pg (ref 26.0–34.0)
MCHC: 32.7 g/dL (ref 30.0–36.0)
MCV: 92 fL (ref 80.0–100.0)
Monocytes Absolute: 0.5 K/uL (ref 0.1–1.0)
Monocytes Relative: 10 %
Neutro Abs: 2.6 K/uL (ref 1.7–7.7)
Neutrophils Relative %: 48 %
Platelets: 279 K/uL (ref 150–400)
RBC: 5.02 MIL/uL (ref 4.22–5.81)
RDW: 13.5 % (ref 11.5–15.5)
WBC: 5.3 K/uL (ref 4.0–10.5)
nRBC: 0 % (ref 0.0–0.2)

## 2024-10-06 LAB — POCT URINE DRUG SCREEN - MANUAL ENTRY (I-SCREEN)
POC Amphetamine UR: NOT DETECTED
POC Buprenorphine (BUP): NOT DETECTED
POC Cocaine UR: NOT DETECTED
POC Marijuana UR: NOT DETECTED
POC Methadone UR: NOT DETECTED
POC Methamphetamine UR: NOT DETECTED
POC Morphine: NOT DETECTED
POC Oxazepam (BZO): NOT DETECTED
POC Oxycodone UR: NOT DETECTED
POC Secobarbital (BAR): NOT DETECTED

## 2024-10-06 LAB — LIPID PANEL
Cholesterol: 220 mg/dL — ABNORMAL HIGH (ref 0–200)
HDL: 45 mg/dL
LDL Cholesterol: 150 mg/dL — ABNORMAL HIGH (ref 0–99)
Total CHOL/HDL Ratio: 4.9 ratio
Triglycerides: 126 mg/dL
VLDL: 25 mg/dL (ref 0–40)

## 2024-10-06 MED ORDER — LOPERAMIDE HCL 2 MG PO CAPS: 2.0000 mg | ORAL_CAPSULE | Freq: Four times a day (QID) | ORAL | Status: DC | PRN

## 2024-10-06 MED ORDER — DIPHENHYDRAMINE HCL 50 MG/ML IJ SOLN: 50.0000 mg | Freq: Three times a day (TID) | INTRAMUSCULAR | Status: DC | PRN

## 2024-10-06 MED ORDER — ACETAMINOPHEN 325 MG PO TABS: 650.0000 mg | ORAL_TABLET | Freq: Four times a day (QID) | ORAL | Status: DC | PRN

## 2024-10-06 MED ORDER — DIPHENHYDRAMINE HCL 50 MG PO CAPS: 50.0000 mg | ORAL_CAPSULE | Freq: Three times a day (TID) | ORAL | Status: DC | PRN

## 2024-10-06 MED ORDER — HYDROXYZINE HCL 25 MG PO TABS
25.0000 mg | ORAL_TABLET | Freq: Three times a day (TID) | ORAL | Status: DC | PRN
  Administered 2024-10-06: 25 mg via ORAL
  Filled 2024-10-06: qty 1

## 2024-10-06 MED ORDER — TRAZODONE HCL 50 MG PO TABS: 50.0000 mg | ORAL_TABLET | Freq: Every evening | ORAL | Status: DC | PRN

## 2024-10-06 MED ORDER — IBUPROFEN 400 MG PO TABS
800.0000 mg | ORAL_TABLET | Freq: Three times a day (TID) | ORAL | Status: DC
  Administered 2024-10-06 – 2024-10-07 (×2): 800 mg via ORAL
  Filled 2024-10-06 (×6): qty 2

## 2024-10-06 MED ORDER — MAGNESIUM HYDROXIDE 400 MG/5ML PO SUSP: 30.0000 mL | Freq: Every day | ORAL | Status: DC | PRN

## 2024-10-06 MED ORDER — HALOPERIDOL 5 MG PO TABS: 5.0000 mg | ORAL_TABLET | Freq: Three times a day (TID) | ORAL | Status: DC | PRN

## 2024-10-06 MED ORDER — LURASIDONE HCL 40 MG PO TABS: 40.0000 mg | ORAL_TABLET | Freq: Every evening | ORAL | Status: DC

## 2024-10-06 MED ORDER — BUPROPION HCL ER (XL) 150 MG PO TB24
150.0000 mg | ORAL_TABLET | Freq: Every day | ORAL | Status: DC
  Administered 2024-10-07 – 2024-10-08 (×2): 150 mg via ORAL
  Filled 2024-10-06 (×2): qty 1

## 2024-10-06 MED ORDER — LORAZEPAM 2 MG/ML IJ SOLN: 2.0000 mg | Freq: Three times a day (TID) | INTRAMUSCULAR | Status: DC | PRN

## 2024-10-06 MED ORDER — LAMOTRIGINE 100 MG PO TABS
200.0000 mg | ORAL_TABLET | Freq: Every day | ORAL | Status: DC
  Administered 2024-10-07 – 2024-10-08 (×2): 200 mg via ORAL
  Filled 2024-10-06 (×2): qty 2

## 2024-10-06 MED ORDER — LURASIDONE HCL 40 MG PO TABS
40.0000 mg | ORAL_TABLET | Freq: Every day | ORAL | Status: DC
  Administered 2024-10-07: 40 mg via ORAL
  Filled 2024-10-06: qty 1

## 2024-10-06 MED ORDER — MIRTAZAPINE 7.5 MG PO TABS
7.5000 mg | ORAL_TABLET | Freq: Every day | ORAL | Status: DC
  Administered 2024-10-06 – 2024-10-07 (×2): 7.5 mg via ORAL
  Filled 2024-10-06 (×2): qty 1

## 2024-10-06 MED ORDER — ALUM & MAG HYDROXIDE-SIMETH 200-200-20 MG/5ML PO SUSP: 30.0000 mL | ORAL | Status: DC | PRN

## 2024-10-06 MED ORDER — GUANFACINE HCL ER 1 MG PO TB24: 1.0000 mg | ORAL_TABLET | Freq: Every day | ORAL | Status: DC

## 2024-10-06 MED ORDER — HALOPERIDOL LACTATE 5 MG/ML IJ SOLN: 10.0000 mg | Freq: Three times a day (TID) | INTRAMUSCULAR | Status: DC | PRN

## 2024-10-06 MED ORDER — HALOPERIDOL LACTATE 5 MG/ML IJ SOLN: 5.0000 mg | Freq: Three times a day (TID) | INTRAMUSCULAR | Status: DC | PRN

## 2024-10-06 MED ORDER — HYDROXYZINE HCL 25 MG PO TABS
50.0000 mg | ORAL_TABLET | Freq: Three times a day (TID) | ORAL | Status: DC | PRN
  Administered 2024-10-06: 50 mg via ORAL
  Filled 2024-10-06: qty 2

## 2024-10-06 NOTE — Group Note (Signed)
 Group Topic: Wellness  Group Date: 10/06/2024 Start Time: 1200 End Time: 1220 Facilitators: Daved Tinnie HERO, RN  Department: [facility]  Number of Participants: 6  Group Focus: nursing group Treatment Modality:  Psychoeducation Interventions utilized were patient education Purpose: increase insight  Name: Randall Parsons Date of Birth: 02/16/1981  MR: [id number]    Level of Participation: pt was not admitted to unit at time of RN group Plan: patient will be encouraged to attend future RN education groups  Patients Problems:  Patient Active Problem List   Diagnosis Date Noted   MDD (major depressive disorder), single episode, severe with psychotic features (HCC) 10/06/2024   MDD (major depressive disorder), recurrent, severe, with psychosis (HCC) 07/25/2024   Muscle pain 07/13/2023   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/01/2023   Abscess of finger 01/31/2023   Hypokalemia 01/31/2023   Paranoid delusion (HCC) 01/31/2023   History of hepatitis C 01/31/2023   Toxic encephalopathy 01/10/2023   MDD (major depressive disorder), recurrent episode, severe (HCC) 12/22/2021   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) 12/18/2021   Left wrist pain 08/27/2021   Bronchospasm 02/18/2021   COVID-19 vaccine dose declined 12/20/2020   Hepatitis C test positive 12/20/2020   Prolactin increased 12/20/2020   Advance care planning 12/20/2020   GAD (generalized anxiety disorder) 04/04/2020   Attention deficit hyperactivity disorder, combined type 04/04/2020   Opioid type dependence, continuous (HCC) 04/04/2020   Major depressive disorder 07/14/2019   Alcohol  abuse 07/14/2019   Cocaine abuse (HCC) 07/12/2019   Low back pain 11/28/2017   NICM (nonischemic cardiomyopathy) (HCC) 11/28/2017   Renal stone 11/28/2017   History of seizure 03/14/2017   IVDU (intravenous drug user) 03/14/2017   Other specified  abnormal immunological findings in serum 12/21/2015

## 2024-10-06 NOTE — ED Provider Notes (Signed)
 [facility]al Health Urgent Care Admission to OBS Unit  Patient Name: Randall Parsons MRN: [id number] Date of Evaluation: 10/06/2024 Chief Complaint:   Diagnosis:  Final diagnoses:  MDD (major depressive disorder), recurrent, severe, with psychosis (HCC)  Sleep disturbance   Mental Status Exam:   Appearance: Middle-aged, average build caucasian male who appears older than stated age. He has close-cropped hair and is wearing weather-appropriate clothing that appears worn but clean. He has dark circles under his eyes.  Behavior: Good eye contact, anxious, fidgeting constantly.  Attitude:  Plaintive  Speech: rapid but not pressured  Mood: I am really depressed and worried  Affect: Congruent, anxious, dysphoric  Thought Process: Circumferential   Thought Content: Endorses thought broadcasting, auditory hallucinations  SI/HI: Endorses passive SI, denies intent or active plan. Denies HI  Perceptions: endorses hearing voices in his head telling him he's worthless. Denies command hallucinations.  Judgment: Metta  Insight: Fair  Fund of Knowledge: WNL      [facility] MSE Disposition for Follow up and Recommendations: Based on my evaluation I certify that psychiatric inpatient services furnished can reasonably be expected to improve the patient's condition which I recommend transfer to an appropriate accepting facility.   Patient has been accepted for admission to the facility based crisis unit  Lynwood Morene Lavone Delsie, MD 10/06/2024, 12:46 PM
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
## 2024-10-06 NOTE — Care Management (Signed)
 [facility] Care Management...  Writer met with patient to discuss discharge planning  Patient reported coming to [facility] for medication stabilization. Patient reported his plan is to return to [facility]. Patient reported having drug dream and has not been able to sleep for a few days.   Writer will contact upon discharge to confirm and verify patient is able to return to [facility] to complete program

## 2024-10-06 NOTE — Progress Notes (Signed)
" °   10/06/24 1000  [facility] Triage Screening (Walk-ins at [facility] only)  What Is the Reason for Your Visit/Call Today? Minh [place] 43y male presents to [facility] unaccompanied. PT states he feels like he is having side effects from his current medications (latuda , wellbutrin ) such as clinching his teeth, restlessness and unable to sleep. PT endorses SI, no plan but had a thought of slitting his wrist. PT has hx of one suicide attempt back in his adult teenage years. PT has hx of an addiction problem - cocaine, opioid, pt states he has been sober for a year. PT denies HI, AVH and alcohol  and substance use.  How Long Has This Been Causing You Problems? 1 wk - 1 month  Have You Recently Had Any Thoughts About Hurting Yourself? Yes  How long ago did you have thoughts about hurting yourself? Earlier today  Are You Planning to Commit Suicide/Harm Yourself At This time? No  Have you Recently Had Thoughts About Hurting Someone Sherral? No  Are You Planning To Harm Someone At This Time? No  Physical Abuse Denies  Verbal Abuse Denies  Sexual Abuse Denies  Exploitation of patient/patient's resources Denies  Self-Neglect Denies  Are you currently experiencing any auditory, visual or other hallucinations? No  Have You Used Any Alcohol  or Drugs in the Past 24 Hours? No  Do you have any current medical co-morbidities that require immediate attention? No  Clinician description of patient physical appearance/behavior: calm, cooperative, oriented, casually dressed  What Do You Feel Would Help You the Most Today? Treatment for Depression or other mood problem;Medication(s)  Determination of Need Urgent (48 hours)  Options For Referral [facility];Outpatient Therapy;Medication Management  Determination of Need filed? Yes    "

## 2024-10-06 NOTE — ED Notes (Signed)
 Patient is in the dayroom A/OX4, Denies SI/HI/AVH and denies needing anything and NAD.  Environment secured per policy. Respirations even and unlabored. Will monitor for safety.

## 2024-10-06 NOTE — ED Notes (Signed)
 Pt oriented to unit, further questions denied. Pt currently eating lunch in dayroom. Pt also administered prn atarax  for c/o anxiety. Pt says he came to facility for medication evaluation. Pt denies current si hi and avh, verbal contract for safety provided. Pt reports feeling depressed, presents as pleasant and cooperative.

## 2024-10-06 NOTE — Telephone Encounter (Signed)
"  error  "

## 2024-10-06 NOTE — Progress Notes (Unsigned)
 Client reported clenching his teeth and nervousness and his leg is shaking most of the day, not sleeping well maybe 2 hours. Client reporte ot makes for a hard day and he is at [facility] house and he is up at 5a and work until [employer]. Client reported he has passive suicidal ideal. Client reported he feels the medication is making his depression worse.client reported lamictal  and welbutrin previously helped but once other meds were adding he feels it lessened the effectiveness. Client reported previously cutting his wrist at age 43 and thinks that would be. Client reported thoughts shoot through his mind of passive S.I but has no intent to harm himself.  Client is seen clenching the right side of his mouth. Client reported the 80mg  increase for Latuda  the clenching happened and then fatigue has increased as well.  Client reported living at [facility] house has been good but living with so many men is a challenge. Client reported he recently went to the funeral of the director of [employer] [employer] which was hard because he does not deal with death well. Client reported hx of cocaine. [employer] [facility] and [employer]

## 2024-10-06 NOTE — ED Notes (Signed)
 Pt denies complaints since admission to unit. Pt provided with shower shoes as requested. No signs of distress observed. Pt continues to present as pleasant.

## 2024-10-06 NOTE — ED Notes (Signed)
 Pt watching TV in dayroom, no signs of distress reported or observed.

## 2024-10-06 NOTE — Telephone Encounter (Signed)
 Patient came in today to see a therapist. He is complaining of grinding teeth, and increased anxiety. He is concerned about his medications and would like a call.
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
## 2024-10-06 NOTE — Discharge Instructions (Addendum)
 [facility] Care Management...  Patient reported to [facility] for medication stabilization  Patient will discharge today Friday 10/08/24 to[facility] [address] [place], [place] [address] Phone: [phone]   RN will arrange transportation  Scripts have been provided  Psychotherapy: What to Expect  If youve never done therapy before, youre not alone--and youre not expected to know how it all works right away. The word therapy can mean many different things. There are lots of types of psychotherapy out there: some are short-term and focused on specific goals, while others are more open-ended and longer-term.  What matters most is that you feel heard and supported. A good therapist will take the time to understand your concerns, help you set clear goals, and work with you to develop a treatment plan that fits your needs--regardless of the type of therapy they practice.  Think of finding a therapist like finding the right pair of shoes. Not every pair fits the same person or situation. A good therapist should help you feel supported, safe, and ready to grow. And sometimes, just like with new shoes, it takes a few sessions to feel comfortable. But if it still doesnt feel right after a few visits, its perfectly okay to try someone else. You havent done anything wrong--this is about finding the right fit for you.  Dont give up if the first match isnt ideal. Therapy works best when you feel comfortable enough to speak openly and work through occidental petroleum bothering you.  The term therapy can mean many things. There are many different types of psychotherapy, some are short-term, others take a longer period of time. A good therapist will meet with you, discuss your goals, and help you set a treatment plan that addresses your major concerns, regardless of what type of therapy they practice. If the first therapist is not a great fit, try another one.   The American Psychological Association (the  APA) has resources to learn about different kinds of therapy.  Here is a link to learn more about the different kinds of psychotherapy:   gymville.si __ Outpatient Therapy and Psychiatry Resources for Patients: Your psychiatric needs would be well-served by consultation and regular meetings with an outpatient therapist to assist you with your mood-related conditions. Here are a series of links for finding a therapist.    Includes links to the following: [facility] (http://wilson-mayo.com/) (only for [facility] and please reserve for uninsured) [facility] (http://blankenship-martinez.net/) Psychology Today Special Educational Needs Teacher (https://www.psychologytoday.com/us lendell) Psychology Today Support Group Tax Inspector (https://www.psychologytoday.com/us /groups/) [employer] - [employer] Location (https://carolinabehavioralcare.com/staff-location/[place]/) [facility] - Support Group Finder - (recorddebt.fi) [facility] [employer] - Lexicographer (https://fspcares.org/contact/) [employer] for Mental Health [place] - NAMI (https://namiguilford.org/support-and-education/support-groups/) [employer] [facility] - Affiliated with [facility] (https://www.[place].com/lb/locations/profile/[facility]-at-[address]-reed-drive/) Dept of Health and Human Services - Find a mental health facility (http://lester.info/) __ Mens Group Therapy and Support Resources  Group therapy can play a vital role in supporting mens mental health. Connecting with other men who have faced similar experiences can foster meaningful bonds and provide a sense of community.  These groups offer opportunities to listen, share, and learn from one another in a supportive environment.  Mens mental health support groups create safe spaces that help reduce stigma while complementing your treatment goals. They provide the emotional support you may need on your journey toward wellness.  Below are some local resources that offer men-only group therapy sessions:  [facility], [facility] Phone: [phone] Address: [address]  [facility] [email or web address] [address] [place], [place] [address] Phone: [phone]

## 2024-10-07 DIAGNOSIS — F333 Major depressive disorder, recurrent, severe with psychotic symptoms: Secondary | ICD-10-CM | POA: Diagnosis not present

## 2024-10-07 DIAGNOSIS — R45851 Suicidal ideations: Secondary | ICD-10-CM | POA: Diagnosis not present

## 2024-10-07 DIAGNOSIS — G4700 Insomnia, unspecified: Secondary | ICD-10-CM | POA: Diagnosis not present

## 2024-10-07 DIAGNOSIS — F419 Anxiety disorder, unspecified: Secondary | ICD-10-CM | POA: Diagnosis not present

## 2024-10-07 MED ORDER — ENSURE PLUS HIGH PROTEIN PO LIQD
237.0000 mL | Freq: Two times a day (BID) | ORAL | Status: DC
  Administered 2024-10-07 – 2024-10-08 (×3): 237 mL via ORAL
  Filled 2024-10-07 (×2): qty 237

## 2024-10-07 NOTE — ED Notes (Addendum)
 Pt compliant with scheduled medication.  Pt is on Latuda  Therapy.  Medication given with HS med per patients request.  Offers occasional eye contact.  Affect is flat .  Pt is assertive in making needs known. Pt is polite denies SI, or AVH.  Pt refused motrin  as scheduled at 2200.  Pt stated, I am not in pain, I do not want any motrinSafety maintained.

## 2024-10-07 NOTE — Group Note (Signed)
 Group Topic: Social Support  Group Date: 10/07/2024 Start Time: 1130 End Time: 1200 Facilitators: Leron Charolette CROME, RN  Department: [facility]  Number of Participants: 5  Group Focus: goals/reality orientation and self-awareness Treatment Modality:  Individual Therapy Interventions utilized were patient education and support Purpose: increase insight  Name: Randall Parsons Date of Birth: Jul 12, 1981  MR: [id number]    Level of Participation: Level of Participation: moderate Quality of Participation: cooperative Interactions with others: fair Mood/Affect: appropriate Triggers (if applicable): na  Cognition: coherent/clear Progress: Moderate Response: progressive  Plan: patient will be encouraged to use resources  Patients Problems:  Patient Active Problem List   Diagnosis Date Noted   MDD (major depressive disorder), single episode, severe with psychotic features (HCC) 10/06/2024   MDD (major depressive disorder), recurrent, severe, with psychosis (HCC) 07/25/2024   Muscle pain 07/13/2023   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/01/2023   Abscess of finger 01/31/2023   Hypokalemia 01/31/2023   Paranoid delusion (HCC) 01/31/2023   History of hepatitis C 01/31/2023   Toxic encephalopathy 01/10/2023   MDD (major depressive disorder), recurrent episode, severe (HCC) 12/22/2021   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) 12/18/2021   Left wrist pain 08/27/2021   Bronchospasm 02/18/2021   COVID-19 vaccine dose declined 12/20/2020   Hepatitis C test positive 12/20/2020   Prolactin increased 12/20/2020   Advance care planning 12/20/2020   GAD (generalized anxiety disorder) 04/04/2020   Attention deficit hyperactivity disorder, combined type 04/04/2020   Opioid type dependence, continuous (HCC) 04/04/2020   Major depressive disorder 07/14/2019   Alcohol  abuse 07/14/2019   Cocaine abuse (HCC)  07/12/2019   Low back pain 11/28/2017   NICM (nonischemic cardiomyopathy) (HCC) 11/28/2017   Renal stone 11/28/2017   History of seizure 03/14/2017   IVDU (intravenous drug user) 03/14/2017   Other specified abnormal immunological findings in serum 12/21/2015

## 2024-10-07 NOTE — Group Note (Addendum)
 Group Topic: Recovery Basics  Group Date: 10/06/2024 Start Time: 2005 End Time: 2105 Facilitators: Nathanael Moulder, NT  Department: [facility]  Number of Participants: 9  Group Focus: abuse issues, check in, chemical dependency education, chemical dependency issues, co-dependency, community group, relapse prevention, and substance abuse education Treatment Modality:  Psychoeducation Interventions utilized were reminiscence, story telling, and support Purpose: enhance coping skills and relapse prevention strategies  Name: Randall Parsons Date of Birth: 11-17-1980  MR: [id number]    Level of Participation: active Quality of Participation: cooperative Interactions with others: Appropriate Mood/Affect: appropriate Triggers (if applicable): None noticed Cognition: coherent/clear Progress: Gaining insight Response: Appropriate Plan: patient will be encouraged to attend future groups  Patients Problems:  Patient Active Problem List   Diagnosis Date Noted   MDD (major depressive disorder), single episode, severe with psychotic features (HCC) 10/06/2024   MDD (major depressive disorder), recurrent, severe, with psychosis (HCC) 07/25/2024   Muscle pain 07/13/2023   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/01/2023   Abscess of finger 01/31/2023   Hypokalemia 01/31/2023   Paranoid delusion (HCC) 01/31/2023   History of hepatitis C 01/31/2023   Toxic encephalopathy 01/10/2023   MDD (major depressive disorder), recurrent episode, severe (HCC) 12/22/2021   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) 12/18/2021   Left wrist pain 08/27/2021   Bronchospasm 02/18/2021   COVID-19 vaccine dose declined 12/20/2020   Hepatitis C test positive 12/20/2020   Prolactin increased 12/20/2020   Advance care planning 12/20/2020   GAD (generalized anxiety disorder) 04/04/2020   Attention deficit hyperactivity disorder,  combined type 04/04/2020   Opioid type dependence, continuous (HCC) 04/04/2020   Major depressive disorder 07/14/2019   Alcohol  abuse 07/14/2019   Cocaine abuse (HCC) 07/12/2019   Low back pain 11/28/2017   NICM (nonischemic cardiomyopathy) (HCC) 11/28/2017   Renal stone 11/28/2017   History of seizure 03/14/2017   IVDU (intravenous drug user) 03/14/2017   Other specified abnormal immunological findings in serum 12/21/2015

## 2024-10-07 NOTE — Care Plan (Signed)
 Interdisciplinary Treatment and Diagnostic Plan Update  10/07/2024 Time of Session: 2:33pm Randall Parsons MRN: [id number]  Diagnosis:  Final diagnoses:  MDD (major depressive disorder), recurrent, severe, with psychosis (HCC)  Sleep disturbance     Current Medications:  Current Facility-Administered Medications  Medication Dose Route Frequency Provider Last Rate Last Admin   acetaminophen  (TYLENOL ) tablet 650 mg  650 mg Oral Q6H PRN Wise, James Benjamin Stallworth, MD       alum & mag hydroxide-simeth (MAALOX/MYLANTA) 200-200-20 MG/5ML suspension 30 mL  30 mL Oral Q4H PRN Delsie Lynwood Morene Lavone, MD       buPROPion  (WELLBUTRIN  XL) 24 hr tablet 150 mg  150 mg Oral Daily Delsie Lynwood Morene Lavone, MD   150 mg at 10/07/24 0915   haloperidol  (HALDOL ) tablet 5 mg  5 mg Oral TID PRN Delsie Lynwood Morene Lavone, MD       And   diphenhydrAMINE  (BENADRYL ) capsule 50 mg  50 mg Oral TID PRN Delsie Lynwood Morene Lavone, MD       haloperidol  lactate (HALDOL ) injection 5 mg  5 mg Intramuscular TID PRN Delsie Lynwood Morene Lavone, MD       And   diphenhydrAMINE  (BENADRYL ) injection 50 mg  50 mg Intramuscular TID PRN Delsie Lynwood Morene Lavone, MD       And   LORazepam  (ATIVAN ) injection 2 mg  2 mg Intramuscular TID PRN Delsie Lynwood Morene Lavone, MD       haloperidol  lactate (HALDOL ) injection 10 mg  10 mg Intramuscular TID PRN Delsie Lynwood Morene Lavone, MD       And   diphenhydrAMINE  (BENADRYL ) injection 50 mg  50 mg Intramuscular TID PRN Delsie Lynwood Morene Lavone, MD       And   LORazepam  (ATIVAN ) injection 2 mg  2 mg Intramuscular TID PRN Delsie Lynwood Morene Lavone, MD       feeding supplement (ENSURE PLUS HIGH PROTEIN) liquid 237 mL  237 mL Oral BID BM Delsie Lynwood Morene Lavone, MD   237 mL at 10/07/24 0917   hydrOXYzine  (ATARAX ) tablet 50 mg  50 mg Oral TID PRN Delsie Lynwood Morene Lavone, MD   50 mg at 10/06/24 1725    ibuprofen  (ADVIL ) tablet 800 mg  800 mg Oral TID Delsie Lynwood Morene Lavone, MD   800 mg at 10/06/24 1521   lamoTRIgine  (LAMICTAL ) tablet 200 mg  200 mg Oral Daily Delsie Lynwood Morene Lavone, MD   200 mg at 10/07/24 0915   loperamide  (IMODIUM ) capsule 2 mg  2 mg Oral QID PRN Delsie Lynwood Morene Lavone, MD       lurasidone  (LATUDA ) tablet 40 mg  40 mg Oral QPC supper Delsie Lynwood Morene Lavone, MD       magnesium  hydroxide (MILK OF MAGNESIA) suspension 30 mL  30 mL Oral Daily PRN Delsie Lynwood Morene Lavone, MD       mirtazapine  (REMERON ) tablet 7.5 mg  7.5 mg Oral QHS Delsie Lynwood Morene Lavone, MD   7.5 mg at 10/06/24 2111   Current Outpatient Medications  Medication Sig Dispense Refill   buPROPion  (WELLBUTRIN  XL) 150 MG 24 hr tablet Take 1 tablet (150 mg total) by mouth daily. 30 tablet 3   guanFACINE  (INTUNIV ) 1 MG TB24 ER tablet Take 1 tablet (1 mg total) by mouth daily. 30 tablet 3   ibuprofen  (ADVIL ) 800 MG tablet Take 1 tablet (800 mg total) by mouth 3 (three) times daily. 30 tablet 0   lamoTRIgine  (LAMICTAL ) 200  MG tablet Take 1 tablet (200 mg total) by mouth daily. 30 tablet 3   loperamide  (IMODIUM  A-D) 2 MG tablet Take 1 tablet (2 mg total) by mouth 4 (four) times daily as needed for diarrhea or loose stools. 30 tablet 0   lurasidone  (LATUDA ) 80 MG TABS tablet Take 1 tablet (80 mg total) by mouth every evening. 30 tablet 3   mirtazapine  (REMERON ) 15 MG tablet Take 1 tablet (15 mg total) by mouth at bedtime. 30 tablet 3   traMADol  (ULTRAM ) 50 MG tablet Take 1 tablet (50 mg total) by mouth every 6 (six) hours as needed. 12 tablet 0   PTA Medications: Prior to Admission medications  Medication Sig Start Date End Date Taking? Authorizing Provider  buPROPion  (WELLBUTRIN  XL) 150 MG 24 hr tablet Take 1 tablet (150 mg total) by mouth daily. 08/23/24  Yes Harl Regan E, NP  guanFACINE  (INTUNIV ) 1 MG TB24 ER tablet Take 1 tablet (1 mg total) by mouth daily.  08/23/24  Yes Harl Regan E, NP  ibuprofen  (ADVIL ) 800 MG tablet Take 1 tablet (800 mg total) by mouth 3 (three) times daily. 09/17/24  Yes Reddick, Johnathan B, NP  lamoTRIgine  (LAMICTAL ) 200 MG tablet Take 1 tablet (200 mg total) by mouth daily. 08/23/24  Yes Harl Regan E, NP  loperamide  (IMODIUM  A-D) 2 MG tablet Take 1 tablet (2 mg total) by mouth 4 (four) times daily as needed for diarrhea or loose stools. 09/17/24  Yes Reddick, Johnathan B, NP  lurasidone  (LATUDA ) 80 MG TABS tablet Take 1 tablet (80 mg total) by mouth every evening. 08/23/24  Yes Harl Regan E, NP  mirtazapine  (REMERON ) 15 MG tablet Take 1 tablet (15 mg total) by mouth at bedtime. 08/23/24  Yes Harl Regan E, NP  traMADol  (ULTRAM ) 50 MG tablet Take 1 tablet (50 mg total) by mouth every 6 (six) hours as needed. 08/27/24  Yes Patt Alm Macho, MD  pantoprazole  (PROTONIX ) 40 MG tablet Take 1 tablet (40 mg total) by mouth daily. 07/17/19 08/03/19  Clapacs, Norleen DASEN, MD    Patient Stressors: Medication change or noncompliance   Other: Client reports polysubstance use in remission.  Client presented to [facility] due to depression, insomnia, agitation, and irritability    Patient Strengths: Ability for insight  Communication skills  Motivation for treatment/growth   Treatment Modalities: Medication Management, Group therapy, Case management,  1 to 1 session with clinician, Psychoeducation, Recreational therapy.   Physician Treatment Plan for Primary and Secondary Diagnosis:  Final diagnoses:  MDD (major depressive disorder), recurrent, severe, with psychosis (HCC)  Sleep disturbance   Long Term Goal(s): Improvement in symptoms so as ready for discharge  Short Term Goals: Patient will verbalize feelings in meetings with treatment team members. Patient will attend at least of 50% of the groups daily. Pt will complete the PHQ9 on admission, day 3 and discharge. Patient will participate in completing the  [place] Suicide Severity Rating Scale Patient will score a low risk of violence for 24 hours prior to discharge Patient will take medications as prescribed daily.  Medication Management: Evaluate patient's response, side effects, and tolerance of medication regimen.  Therapeutic Interventions: 1 to 1 sessions, Unit Group sessions and Medication administration.  Evaluation of Outcomes: Progressing  LCSW Treatment Plan for Primary Diagnosis:  Final diagnoses:  MDD (major depressive disorder), recurrent, severe, with psychosis (HCC)  Sleep disturbance    Long Term Goal(s): Safe transition to appropriate next level of care at discharge.  Short Term Goals:  Facilitate acceptance of mental health diagnosis and concerns through verbal commitment to aftercare plan and appointments at discharge.  Therapeutic Interventions: Assess for all discharge needs, 1 to 1 time with Child psychotherapist, Explore available resources and support systems, Assess for adequacy in community support network, Educate family and significant other(s) on suicide prevention, Complete Psychosocial Assessment, Interpersonal group therapy.  Evaluation of Outcomes: Progressing   Progress in Treatment: Attending groups: Yes. Participating in groups: Yes. Taking medication as prescribed: Yes. Toleration medication: No. Family/Significant other contact made: No Patient understands diagnosis: Yes. Discussing patient identified problems/goals with staff: Yes. Medical problems stabilized or resolved: Yes. Denies suicidal/homicidal ideation: Yes. Issues/concerns per patient self-inventory: No. Other: Client is reporting active SI/HI and or plans.  Client reports no passive SI/HI.  Client reports no AVH.    New problem(s) identified: No, Describe:     New Short Term/Long Term Goal(s):  Gain stabilization for medication management and then return back to [facility].  Client reports his polysubstance use is in remission.    Patient Goals:  be able to sleep better, feel less agitated/ irritable, and self report of decreased depressive mood/symptom.   Discharge Plan or Barriers: Client reports no barriers and reports he will stay until he's discharged back to [facility].   Reason for Continuation of Hospitalization: Medication stabilization, monitor depression symptoms, and decrease anxiety.    Estimated Length of Stay:10/06/24 10/09/24  Last 3 [place] Suicide Severity Risk Score: Flowsheet Row ED from 10/06/2024 in [facility] from 09/17/2024 in [facility] ED from 08/27/2024 in [facility] Department [facility]  C-SSRS RISK CATEGORY Moderate Risk No Risk No Risk    Last [facility] 2/9 Scores:    10/06/2024   11:22 AM 08/23/2024   10:02 AM 07/29/2024    9:12 AM  Depression screen PHQ 2/9  Decreased Interest 2 1 1   Down, Depressed, Hopeless 3 2 1   PHQ - 2 Score 5 3 2   Altered sleeping 3 3 1   Tired, decreased energy 2 3 1   Change in appetite 2 3 0  Feeling bad or failure about yourself  2 3 1   Trouble concentrating 3 3 2   Moving slowly or fidgety/restless 3 2 0  Suicidal thoughts 2 0 0  PHQ-9 Score 22 20 7    Difficult doing work/chores Very difficult Very difficult      Data saved with a previous flowsheet row definition    Scribe for Treatment Team: Sani Loiseau, LCSW 10/07/2024 2:33 PM

## 2024-10-07 NOTE — Group Note (Signed)
 Group Topic: Recovery Basics  Group Date: 10/07/2024 Start Time: 1000 End Time: 1050 Facilitators: Gerome Jolly, NT  Department: [facility]  Number of Participants: 5  Group Focus: substance abuse education Treatment Modality:  Cognitive Behavioral Therapy, Psychoeducation, Solution-Focused Therapy, and Spiritual Interventions utilized were exploration and story telling Purpose: explore maladaptive thinking, increase insight, and  Facilitator shared his recovery journey, identifying the pitfalls along the way. This was done to illustrate that recovery is possible. The facilitator engaged the group in writing a gratitude list and sharing that list with group if they felt comfortable  Name: Randall Parsons Date of Birth: Aug 11, 1981  MR: [id number]    Level of Participation: minimal Quality of Participation: attentive, cooperative, and engaged Interactions with others: gave feedback Mood/Affect: appropriate and flat Triggers (if applicable): NA Cognition: coherent/clear and goal directed Progress: Moderate Response: Patient listened as facilitator shared his recovery story. Patient identified things that he is grateful for.  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/01/2023   Abscess of finger 01/31/2023   Hypokalemia 01/31/2023   Paranoid delusion (HCC) 01/31/2023   History of hepatitis C 01/31/2023   Toxic encephalopathy 01/10/2023   MDD (major depressive disorder), recurrent episode, severe (HCC) 12/22/2021   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) 12/18/2021   Left wrist pain 08/27/2021    Bronchospasm 02/18/2021   COVID-19 vaccine dose declined 12/20/2020   Hepatitis C test positive 12/20/2020   Prolactin increased 12/20/2020   Advance care planning 12/20/2020   GAD (generalized anxiety disorder) 04/04/2020   Attention deficit hyperactivity disorder, combined type 04/04/2020   Opioid type dependence, continuous (HCC) 04/04/2020   Major depressive disorder 07/14/2019   Alcohol  abuse 07/14/2019   Cocaine abuse (HCC) 07/12/2019   Low back pain 11/28/2017   NICM (nonischemic cardiomyopathy) (HCC) 11/28/2017   Renal stone 11/28/2017   History of seizure 03/14/2017   IVDU (intravenous drug user) 03/14/2017   Other specified abnormal immunological findings in serum 12/21/2015

## 2024-10-07 NOTE — ED Notes (Signed)
"  Patient asleep at this time   "

## 2024-10-07 NOTE — ED Notes (Signed)
 [name]&Ox4. Denies intent to harm self/others when asked. Denies A/VH. Patient denies any physical complaints when asked. No acute distress noted. Support and encouragement provided. Routine safety checks conducted according to facility protocol. Encouraged patient to notify staff if thoughts of harm toward self or others arise. Patient verbalize understanding and agreement. Will continue to monitor for safety.

## 2024-10-07 NOTE — Group Note (Signed)
 Group Topic: Social Support  Group Date: 10/07/2024 Start Time: 0800 End Time: 0845 Facilitators: Nena Lesley PARAS, NT  Department: [facility]  Number of Participants: 5  Group Focus: check in and community group Treatment Modality:  Psychoeducation Interventions utilized were exploration and support Purpose: explore maladaptive thinking, express feelings, and increase insight  Name: Randall Parsons Date of Birth: 06/03/1981  MR: [id number]    Level of Participation: active Quality of Participation: engaged Interactions with others: gave feedback Mood/Affect: appropriate Triggers (if applicable): N/A Cognition: coherent/clear Progress: Gaining insight Response: The patient did not have any concerns. His behavior and communication was appropriate and pleasant throughout the group. 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/01/2023   Abscess of finger 01/31/2023   Hypokalemia 01/31/2023   Paranoid delusion (HCC) 01/31/2023   History of hepatitis C 01/31/2023   Toxic encephalopathy 01/10/2023   MDD (major depressive disorder), recurrent episode, severe (HCC) 12/22/2021   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) 12/18/2021   Left wrist pain 08/27/2021   Bronchospasm 02/18/2021   COVID-19 vaccine dose declined 12/20/2020   Hepatitis C test positive 12/20/2020   Prolactin increased 12/20/2020   Advance care planning 12/20/2020   GAD (generalized anxiety disorder) 04/04/2020    Attention deficit hyperactivity disorder, combined type 04/04/2020   Opioid type dependence, continuous (HCC) 04/04/2020   Major depressive disorder 07/14/2019   Alcohol  abuse 07/14/2019   Cocaine abuse (HCC) 07/12/2019   Low back pain 11/28/2017   NICM (nonischemic cardiomyopathy) (HCC) 11/28/2017   Renal stone 11/28/2017   History of seizure 03/14/2017   IVDU (intravenous drug user) 03/14/2017   Other specified abnormal immunological findings in serum 12/21/2015

## 2024-10-07 NOTE — ED Notes (Signed)
 Patient asleep, NAD. Will continue to monitor for safety.

## 2024-10-07 NOTE — ED Notes (Addendum)
 Pt maintained at the beginning of the shift.  Seated in the community room with peers, interacting with others appropriately, safety maintained.

## 2024-10-07 NOTE — ED Notes (Signed)
 Pt sitting in dayroom watching television and interacting with peers. No acute distress noted. No concerns voiced. Informed pt to notify staff with any needs or assistance. Pt verbalized understanding and agreement. Will continue to monitor for safety.
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
## 2024-10-08 DIAGNOSIS — F419 Anxiety disorder, unspecified: Secondary | ICD-10-CM | POA: Diagnosis not present

## 2024-10-08 DIAGNOSIS — R45851 Suicidal ideations: Secondary | ICD-10-CM | POA: Diagnosis not present

## 2024-10-08 DIAGNOSIS — G479 Sleep disorder, unspecified: Secondary | ICD-10-CM

## 2024-10-08 DIAGNOSIS — T50905A Adverse effect of unspecified drugs, medicaments and biological substances, initial encounter: Secondary | ICD-10-CM

## 2024-10-08 DIAGNOSIS — G4700 Insomnia, unspecified: Secondary | ICD-10-CM | POA: Diagnosis not present

## 2024-10-08 DIAGNOSIS — F333 Major depressive disorder, recurrent, severe with psychotic symptoms: Secondary | ICD-10-CM | POA: Diagnosis not present

## 2024-10-08 MED ORDER — HYDROXYZINE HCL 50 MG PO TABS: 50.0000 mg | ORAL_TABLET | Freq: Three times a day (TID) | ORAL | 0 refills | Status: DC | PRN

## 2024-10-08 MED ORDER — PRAZOSIN HCL 1 MG PO CAPS: 1.0000 mg | ORAL_CAPSULE | Freq: Every day | ORAL | Status: DC

## 2024-10-08 MED ORDER — MIRTAZAPINE 7.5 MG PO TABS: 7.5000 mg | ORAL_TABLET | Freq: Every day | ORAL | 0 refills | Status: DC

## 2024-10-08 MED ORDER — PRAZOSIN HCL 1 MG PO CAPS: 1.0000 mg | ORAL_CAPSULE | Freq: Every day | ORAL | 0 refills | Status: DC

## 2024-10-08 MED ORDER — LURASIDONE HCL 40 MG PO TABS: 40.0000 mg | ORAL_TABLET | Freq: Every day | ORAL | 0 refills | Status: DC

## 2024-10-08 MED ORDER — IBUPROFEN 800 MG PO TABS: 800.0000 mg | ORAL_TABLET | Freq: Three times a day (TID) | ORAL | 0 refills | Status: DC

## 2024-10-08 MED ORDER — LAMOTRIGINE 200 MG PO TABS: 200.0000 mg | ORAL_TABLET | Freq: Every day | ORAL | 0 refills | Status: AC

## 2024-10-08 MED ORDER — BUPROPION HCL ER (XL) 150 MG PO TB24: 150.0000 mg | ORAL_TABLET | Freq: Every day | ORAL | 0 refills | Status: DC
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
## 2024-10-08 NOTE — Care Management (Signed)
 FBC Care Management...  Addendum 9:36 am  Writer spoke with Darryl at [facility]  Darryl advised that there is specific time for patient to arrive. He request return call to advise when patient has in Comptroller met with provider to discuss patient's discharge planning..  Patient will be discharging today returning too [name]  Writer reached out to [employer] to verify any specific time for patients arrival  No answer, writer left HIPAA compliant message for return call  Patient will [facility] to[facility] [address] [place], [place] [address] Phone: [phone]   RN will arrange transportation  Scripts have been provided

## 2024-10-08 NOTE — ED Notes (Signed)
 [name]&O x 4, ambulatory. Patient discharged in no acute distress. Patient denied SI/HI, A/VH upon discharge. Patient verbalized understanding of all discharge instructions reviewed on AVS via staff, to include follow up appointments, RX's and safety. Suicide safety plan completed and reviewed with Clinical research associate. A copy given to pt. Patient reported mood 10/10.  Pt belongings returned to patient from locker # intact. Patient escorted to lobby via staff for transport to destination. Safety maintained.

## 2024-10-08 NOTE — ED Notes (Signed)
 [name]&Ox4. Denies intent to harm self/others when asked. Denies A/VH. Patient denies any physical complaints when asked. No acute distress noted. Support and encouragement provided. Routine safety checks conducted according to facility protocol. Encouraged patient to notify staff if thoughts of harm toward self or others arise. Patient verbalize understanding and agreement. Will continue to monitor for safety.

## 2024-10-08 NOTE — ED Notes (Signed)
 Pt sleeping quietly in no apparent distress. Observed to turne and reposition.  Safety maintained.

## 2024-10-09 ENCOUNTER — Ambulatory Visit (HOSPITAL_COMMUNITY)
Admission: EM | Admit: 2024-10-09 | Discharge: 2024-10-09 | Disposition: A | Discharge status: Home / Self Care | Payer: MEDICAID | Attending: Family | Admitting: Family

## 2024-10-09 DIAGNOSIS — F323 Major depressive disorder, single episode, severe with psychotic features: Secondary | ICD-10-CM | POA: Insufficient documentation

## 2024-10-09 NOTE — Discharge Instructions (Addendum)
 lurasidone  (LATUDA ) 40 MG TABS tablet Medication updates to lurasidone  (Latuda  40mg  daily) during recent inpatient stay at [facility]: reminder to take lurasidone  with at least 400kcal (ideally with evening meal) to optimize absorption.   Patient is instructed prior to discharge to:  Take all medications as prescribed by his/her mental healthcare provider. Report any adverse effects and or reactions from the medicines to his/her outpatient provider promptly. Keep all scheduled appointments, to ensure that you are getting refills on time and to avoid any interruption in your medication.  If you are unable to keep an appointment call to reschedule.  Be sure to follow-up with resources and follow-up appointments provided.  Patient has been instructed & cautioned: To not engage in alcohol  and or illegal drug use while on prescription medicines. In the event of worsening symptoms, patient is instructed to call the crisis hotline, 911 and or go to the nearest ED for appropriate evaluation and treatment of symptoms. To follow-up with his/her primary care provider for your other medical issues, concerns and or health care needs.  Information: -National Suicide Prevention Lifeline 1-800-SUICIDE or 5183294789.  -988 offers 24/7 access to trained crisis counselors who can help people experiencing mental health-related distress. People can call or text 988 or chat 988lifeline.org for themselves or if they are worried about a loved one who may need crisis support.

## 2024-10-09 NOTE — Progress Notes (Signed)
" °   10/09/24 0947  [facility] Triage Screening (Walk-ins at [facility] only)  How Did You Hear About Us ? Self  What Is the Reason for Your Visit/Call Today? Randall Parsons is a 57Y male presenting to [facility] as a vol walk-in. Pt states he is here today because he was not honest with the doctor when he was discharged from [facility] yesterday. Pt states he was still hearing voices and he was afraid to tell the doctor because he thought he would have to stay longer. Pt states he has been arguing with the voices because they are telling him that he is not hearing voices. Pt states he is feeling tormented by these voices because they only go away when he sleeps. Pt endorses SI with no plan because he came here. Pt denies HI and substance use.  How Long Has This Been Causing You Problems? <Week  Have You Recently Had Any Thoughts About Hurting Yourself? Yes  How long ago did you have thoughts about hurting yourself? current  Are You Planning to Commit Suicide/Harm Yourself At This time? Yes  Have you Recently Had Thoughts About Hurting Someone Sherral? No  Are You Planning To Harm Someone At This Time? No  Physical Abuse Denies  Verbal Abuse Denies  Sexual Abuse Denies  Exploitation of patient/patient's resources Denies  Are you currently experiencing any auditory, visual or other hallucinations? Yes  Please explain the hallucinations you are currently experiencing: hearing voices  Have You Used Any Alcohol  or Drugs in the Past 24 Hours? No  Do you have any current medical co-morbidities that require immediate attention? No  Clinician description of patient physical appearance/behavior: anxious and tearful  What Do You Feel Would Help You the Most Today? Treatment for Depression or other mood problem;Medication(s)  Determination of Need Urgent (48 hours)  Options For Referral Medication Management;Outpatient Therapy;Inpatient Hospitalization  Determination of Need filed? Yes    "

## 2024-10-09 NOTE — ED Provider Notes (Signed)
 Behavioral Health Urgent Care Medical Screening Exam  Patient Name: Randall Parsons MRN: [id number] Date of Evaluation: 10/09/2024 Chief Complaint:  Passive suicidal ideation without plan Diagnosis:  Final diagnoses:  MDD (major depressive disorder), single episode, severe with psychotic features (HCC)    History of Present illness: Randall Parsons is a 44 y.o. male.  History includes MDD with psychotic features, polysubstance abuse in sustained remission.  Patient arrives voluntarily to [facility] behavioral health for walk-in assessment. Randall Parsons discharged from [facility] behavioral health facility based crisis unit on yesterday.  Patient states Randall Parsons house is going door-to-door today collecting money, I told them that I felt suicidal so that I could come here.  [name] he does not like participating in door-to-door solicitation activities.  Randall Parsons endorses passive suicidal ideation ongoing approximately 12 to 18 months.  [name].  He contracts verbally for safety at this time.  History 1 previous suicide attempt more than 20 years ago.  He denies nonsuicidal self-harm behavior.  He denies HI.  [name], ongoing for approx one year, surrounding getting in trouble or going to prison. Denies visual hallucinations, denies command auditory hallucinations.  There is no indication patient is responding to internal stimuli currently.  Chart reviewed and patient discussed with attending psychiatrist, Dr. Cole, 10/09/2024.  Patient is assessed by this nurse practitioner face-to-face.  He is seated, no apparent distress.  [name], pleasant and cooperative during assessment.  Patient presents with euthymic mood, congruent affect.  Patient reports return to [facility] house on yesterday went good.  He confirms continued compliance with medication. He endorses average sleep and  appetite. Appointment scheduled next week at [facility] behavioral health outpatient.  Patient offered support and encouragement.  This clinical research associate offered to call to [facility] house on his behalf, patient declines. Randall Parsons prefers to call staff member Randall Parsons who transported him to [facility] today for transport back to [employer] [facility].    Patient  educated and verbalizes understanding of mental health resources and other crisis services in the community. [name], auditory/visual/hallucinations, or detrimental worsening of mental health condition.     Flowsheet Row ED from 10/09/2024 in [facility] ED from 10/06/2024 in [facility] UC from 09/17/2024 in [facility] at [facility] RISK CATEGORY Low Risk Moderate Risk No Risk    Psychiatric Specialty Exam  Presentation  General Appearance:Appropriate for Environment; Casual  Eye Contact:Good  Speech:Clear and Coherent; Normal Rate  Speech Volume:Normal  Handedness:Right   Mood and Affect  Mood: Euthymic  Affect: Appropriate; Congruent   Thought Process  Thought Processes: Coherent; Goal Directed; Linear  Descriptions of Associations:Intact  Orientation:Full (Time, Place and Person)  Thought Content:Logical; WDL  Diagnosis of Schizophrenia or Schizoaffective disorder in past: No   Hallucinations:None Arrogant, derogatory voces  Ideas of Reference:None  Suicidal Thoughts:Yes, Passive With Intent; Without Plan Without Intent; Without Plan  Homicidal Thoughts:No   Sensorium  Memory: Immediate Good; Recent Fair  Judgment: Fair  Insight: Fair   Executive Functions  Concentration: Good  Attention Span: Good  Recall: Good  Fund of Knowledge: Good  Language: Good   Psychomotor Activity  Psychomotor  Activity: Normal   Assets  Assets: Communication Skills; Desire for Improvement; Financial Resources/Insurance; Housing; Resilience; Social Support   Sleep  Sleep: Good  Number of hours:  7   Physical Exam: Physical Exam Vitals and nursing note reviewed.  Constitutional:      Appearance: Normal appearance. He is well-developed.  HENT:     Head: Normocephalic and atraumatic.     Nose: Nose normal.  Cardiovascular:     Rate and Rhythm: Normal rate.  Pulmonary:     Effort: Pulmonary effort is normal.  Musculoskeletal:        General: Normal range of motion.     Cervical back: Normal range of motion.  Skin:    General: Skin is warm and dry.  Neurological:     Mental Status: [name] to person, place, and time.  Psychiatric:        Attention and Perception: Attention and perception normal.        Mood and Affect: Mood and affect normal.        Speech: Speech normal.        Behavior: Behavior normal. Behavior is cooperative.        Thought Content: Thought content includes suicidal ideation.        Cognition and Memory: Cognition and memory normal.        Judgment: Judgment normal.    Review of Systems  Constitutional: Negative.   HENT: Negative.    Eyes: Negative.   Respiratory: Negative.    Cardiovascular: Negative.   Gastrointestinal: Negative.   Genitourinary: Negative.   Musculoskeletal: Negative.   Skin: Negative.   Neurological: Negative.   Psychiatric/Behavioral:  Positive for suicidal ideas.    Blood pressure (!) 140/83, pulse 66, temperature 98.6 F (37 C), temperature source Oral, resp. rate 18, SpO2 98%. There is no height or weight on file to calculate BMI.  Musculoskeletal: Strength & Muscle Tone: within normal limits Gait & Station: normal Patient leans: N/A   [facility] MSE Discharge Disposition for Follow up and Recommendations: Based on my evaluation the patient does not appear to have an emergency medical condition and can be  discharged with resources and follow up care in outpatient services for Medication Management and Individual Therapy Follow-up with outpatient psychiatry.  You have an appointment scheduled at [facility] behavioral health urgent care second floor on January 6 at 8:30 AM with Dr. Kapoor.  Current medications: -Bupropion  XL 150 mg daily/mood -Hydroxyzine  50 mg 3 times daily as needed/anxiety -Ibuprofen  800 mg 3 times daily as needed/pain -Lamotrigine  200 mg oral daily/mood -Lurasidone  40 mg oral daily after supper/mood -Mirtazapine  7.5 mg oral nightly -Prazosin  1 mg nightly   Ellouise LITTIE Dawn, FNP 10/09/2024, 3:12 PM
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
## 2024-10-11 NOTE — Telephone Encounter (Signed)
 Patient was admitted on 12/31 and is currently still admitted. Will address if needed after his admission.

## 2024-10-12 ENCOUNTER — Encounter (HOSPITAL_COMMUNITY): Payer: MEDICAID

## 2024-10-12 ENCOUNTER — Encounter (HOSPITAL_COMMUNITY): Payer: Self-pay

## 2024-10-18 ENCOUNTER — Ambulatory Visit (HOSPITAL_COMMUNITY)
Admission: EM | Admit: 2024-10-18 | Discharge: 2024-10-18 | Disposition: A | Discharge status: Home / Self Care | Payer: MEDICAID | Attending: Nurse Practitioner | Admitting: Nurse Practitioner

## 2024-10-18 DIAGNOSIS — F319 Bipolar disorder, unspecified: Secondary | ICD-10-CM | POA: Insufficient documentation

## 2024-10-18 DIAGNOSIS — R45851 Suicidal ideations: Secondary | ICD-10-CM | POA: Insufficient documentation

## 2024-10-18 DIAGNOSIS — F411 Generalized anxiety disorder: Secondary | ICD-10-CM | POA: Insufficient documentation

## 2024-10-18 DIAGNOSIS — Z9151 Personal history of suicidal behavior: Secondary | ICD-10-CM | POA: Insufficient documentation

## 2024-10-18 DIAGNOSIS — F331 Major depressive disorder, recurrent, moderate: Secondary | ICD-10-CM

## 2024-10-18 DIAGNOSIS — F609 Personality disorder, unspecified: Secondary | ICD-10-CM | POA: Insufficient documentation

## 2024-10-18 NOTE — ED Provider Notes (Signed)
 Behavioral Health Urgent Care Medical Screening Exam  Patient Name: Randall Parsons MRN: [id number] Date of Evaluation: 10/18/2024 Chief Complaint: Suicidal ideations Diagnosis:  Final diagnoses:  MDD (major depressive disorder), recurrent episode, moderate (HCC)   History of Present illness: Randall Parsons is a 44 y.o. Caucasian male with reported prior mental health diagnoses of bipolar d/o, GAD, insomnia, & personality d/o who presented to the [facility] with complaints of depressive symptoms & SI, related situational factors.  Assessment & Review of Psychiatry Symptoms: Patient presents during assessment calm, cooperative, mood is euthymic as objective assessments. His attention to personal hygiene and grooming is good, eye contact is good, speech is clear & coherent. Thought contents are organized and logical, & linear. Pt currently endorse SI only in the context of his current living situation at the [facility] repeatedly talks about not wanting to be surrounding by 8 or 10 strangers all the time, admits not liking the fact that he resides there. Points to the fact that his ex wife stated that he scares her as being another stressor. Pt talks about hearing voices, but stresses that they only began when I started living at [facility] two and  a half months ago, does not seem to be responding to any internal/external stimuli, there are no overt signs of psychosis; It can be inferred that pt is wanting secondary gains of getting an inpatient admission from reporting that he is having SI and having AH due to not wanting to reside at [facility]. He denies VH, reports paranoia sometimes, but reports currently feeling paranoid. Denies that the [facility] that he claims to hear are frightful.  Pt reports a past suicide attempt, and states that it was twenty years ago. He denies any recent attempts. Pt also was recently discharged from [facility]  Psychiatry unit on 10/15/24, and it has only been 3 days since his discharge, and he has not yet followed up with his outpatient plan. He is complaining about his medications, stating that he is on the wrong medications; claims that he was taken off Wellbutrin  150 mg which he had been taking 2.5 months as well as Lamictal  which he was also taking for the same amount of time and placed on Latuda  60 mg daily, Cogentin 0.5 mg BID & Remeron  15 mg at bedtime, and he feels as though the prior medications were helping. Pt educated that in the context of his reports on anxiety which he states can be debilitating, the Wellbutrin  would worsen it, and would not  make a good choice for him.  Patient verbalized understanding.  Patient reported depressive symptoms, reported symptoms consistent with anxiety as well, but seemed to be reading from a script and  trying tell writer what he thinks will gain him access into an inpatient unit.   Plan: Writer pointed out the fact that his depressive symptoms, as well as his suicidal ideations, and mostly situational related to still residing at the [facility] house, to which patient was in agreement.  Writer described a scenario to patient, and inquired if depressive symptoms will resolve or lessen, and suicidality resolve if he was to be provided with a better housing option, and he was in agreement with this as well.  - A conclusion was arrived at based on this assessment: Patient is wanting secondary gains of housing and shelter from being admitted since he does not like [employer].  -We coordinated with CSW to get him an appointment for medication  management here at the [facility] on the second floor at 8am tomorrow morning.  Patient was educated by nursing on this.  Provided with a taxi voucher back to the [facility] house.  CSW called the [facility] house, who is in agreement that patient can return back to that facility.  Patient left our facility in no acute distress, denies any  intent or plan to harm self or any one else after discharge was educated as follows:  Get help right away if: You have thoughts about hurting yourself or others. Get help right away if you feel like you may hurt yourself or others, or have thoughts about taking your own life. Go to your nearest emergency room or: Call 911. Call the National Suicide Prevention Lifeline at (308) 156-8378 or 988 in the [place] This is open 24 hours a day. If youre a Veteran: Call 988 and press 1. This is open 24 hours a day. Text the [employer] at 318-503-0290. Summary Mental health is not just the absence of mental illness. It involves understanding your emotions and behaviors, and taking steps to manage them in a healthy way. If you have symptoms of mental or emotional distress, get help from family, friends, a health care provider, or a mental health professional. Practice good mental health behaviors such as stress management skills, self-calming skills, exercise, healthy sleeping and eating, and supportive relationships. This information is not intended to replace advice given to you by your health care provider. Make sure you discuss any questions you have with your health care provider.  Education provided on the fact that if experiencing worsening of psychiatry symptoms including suicidal ideations, homicidal ideations, or having auditory/visual hallucinations, etc, to call 911, 988, come back to this location, or go to the nearest ER. Pt verbalized understanding.     Flowsheet Row ED from 10/18/2024 in [facility] ED from 10/09/2024 in [facility] ED from 10/06/2024 in [facility]  C-SSRS RISK CATEGORY Moderate Risk Low Risk Moderate Risk    Psychiatric Specialty Exam  Presentation  General Appearance:Casual  Eye Contact:Fair  Speech:Clear and Coherent  Speech Volume:Normal  Handedness:Right   Mood and  Affect  Mood: Euthymic  Affect: Congruent   Thought Process  Thought Processes: Coherent  Descriptions of Associations:Intact  Orientation:Full (Time, Place and Person)  Thought Content:Logical  Diagnosis of Schizophrenia or Schizoaffective disorder in past: No   Hallucinations:Auditory no overt signs of psychosis-AH are chronic in nature and not new (started 2.5 months ago)  Ideas of Reference:None  Suicidal Thoughts:Yes, Passive With Intent; Without Plan Without Intent; Without Plan  Homicidal Thoughts:No   Sensorium  Memory: Immediate Fair  Judgment: Fair  Insight: Fair   Chartered Certified Accountant: Fair  Attention Span: Fair  Recall: [employer] of Knowledge: Fair  Language: Fair   Psychomotor Activity  Psychomotor Activity: Normal   Assets  Assets: Desire for Improvement   Sleep  Sleep: Fair  Number of hours:  7   Physical Exam: Physical Exam Vitals and nursing note reviewed.    Review of Systems  Psychiatric/Behavioral:  Positive for depression, hallucinations (no overt signs of psychosis) and suicidal ideas (passive, no plan/intent prior to discharge). Negative for memory loss and substance abuse. The patient is nervous/anxious (stable). The patient does not have insomnia.   All other systems reviewed and are negative.  Blood pressure 128/82, pulse 67, temperature 97.9 F (36.6 C), temperature source Oral, resp. rate 18, SpO2  98%. There is no height or weight on file to calculate BMI.  Musculoskeletal: Strength & Muscle Tone: within normal limits Gait & Station: normal Patient leans: N/A   [facility] MSE Discharge Disposition for Follow up and Recommendations: Based on my evaluation the patient does not appear to have an emergency medical condition and can be discharged with resources and follow up care in outpatient services for Medication Management and Individual Therapy Follow up with [facility] Residents Only  Walk-in hours for open access (medication management and therapy) are Monday - Friday 8 am to 11 am. Appointments are limited, In person appointment scheduled for 10/19/2024 at 8 am Upon arrival, please complete the form on the clipboard located at the front desk. If there are no clipboards available, all appointments have been filled for that day.  [facility] [address] [place]  [address] [phone]   Donia Snell, NP 10/18/2024, 12:23 PM

## 2024-10-18 NOTE — Care Management (Signed)
 OBS Care Management   Writer contacted the [facility] and was able to confirm that the patient is able to return to the facility.  Writer informed the NP working with the patient.

## 2024-10-18 NOTE — Discharge Instructions (Signed)

## 2024-10-19 ENCOUNTER — Ambulatory Visit (HOSPITAL_COMMUNITY)
Admission: EM | Admit: 2024-10-19 | Discharge: 2024-10-19 | Disposition: A | Discharge status: Home / Self Care | Payer: MEDICAID | Attending: Psychiatry | Admitting: Psychiatry

## 2024-10-19 ENCOUNTER — Ambulatory Visit (INDEPENDENT_AMBULATORY_CARE_PROVIDER_SITE_OTHER): Payer: MEDICAID

## 2024-10-19 VITALS — BP 125/84 | HR 82 | Ht 65.0 in | Wt 195.0 lb

## 2024-10-19 DIAGNOSIS — R45851 Suicidal ideations: Secondary | ICD-10-CM | POA: Diagnosis not present

## 2024-10-19 DIAGNOSIS — F411 Generalized anxiety disorder: Secondary | ICD-10-CM

## 2024-10-19 DIAGNOSIS — R41840 Attention and concentration deficit: Secondary | ICD-10-CM | POA: Diagnosis not present

## 2024-10-19 DIAGNOSIS — F4323 Adjustment disorder with mixed anxiety and depressed mood: Secondary | ICD-10-CM

## 2024-10-19 DIAGNOSIS — F14150 Cocaine abuse with cocaine-induced psychotic disorder with delusions: Secondary | ICD-10-CM | POA: Diagnosis not present

## 2024-10-19 DIAGNOSIS — F331 Major depressive disorder, recurrent, moderate: Secondary | ICD-10-CM

## 2024-10-19 MED ORDER — BUPROPION HCL ER (XL) 150 MG PO TB24: 150.0000 mg | ORAL_TABLET | Freq: Every day | ORAL | 1 refills | Status: AC

## 2024-10-19 MED ORDER — LURASIDONE HCL 60 MG PO TABS: 60.0000 mg | ORAL_TABLET | Freq: Every day | ORAL | 1 refills | Status: DC

## 2024-10-19 MED ORDER — MIRTAZAPINE 15 MG PO TABS: 15.0000 mg | ORAL_TABLET | Freq: Every day | ORAL | 1 refills | Status: DC

## 2024-10-19 NOTE — Progress Notes (Cosign Needed Addendum)
 BH MD/PA/NP OP Progress Note  10/19/2024 11:23 AM Randall Parsons  MRN:  [id number]  Visit Diagnosis:    ICD-10-CM   1. Suicidal ideation  R45.851     2. 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 Moderate episode of recurrent major depressive disorder (HCC)  F33.1 lurasidone  60 MG TABS    mirtazapine  (REMERON ) 15 MG tablet    buPROPion  (WELLBUTRIN  XL) 150 MG 24 hr tablet    4. GAD (generalized anxiety disorder)  F41.1 mirtazapine  (REMERON ) 15 MG tablet    5. Poor concentration  R41.840 buPROPion  (WELLBUTRIN  XL) 150 MG 24 hr tablet      Assessment:   Randall Parsons presents for follow-up evaluation. Today, 10/19/2024, patient reports feeling depressed associated with decreased interest in daily activities, reduced concentration, reduced appetite, disturbed sleep, decreased energy and recurrent suicidal ideations with a plan to slit his wrists using a blade that has been progressively getting worse over the past 1 year.  He has been feeling anxious, has had no panic attacks.  His symptoms are likely related to the stressors that can include his current living arrangements however he was not very forthcoming about it.  He has also been having auditory hallucinations that have progressively gotten worse since his TBI 2 years ago.  He was not a reliable historian, unable to certain on any specific reasons for depression or having suicidal ideations however kept wanting a higher level of care.  He was seen in the ER yesterday and was discharged back home.  He does have poor coping skills and limited insight into his condition.  He was recently admitted and discharged in January 9 from [facility] and was discontinued on Wellbutrin  and lamotrigine  to avoid worsening of his auditory hallucinations  He was continued on Latuda , Remeron  and Cogentin which he has been consistently taking without any side effects or any new physical concerns.   He is not using any substances including alcohol  or cigarettes and has been sober over the past 1 year from using cocaine.  There is a high suspicion that his TBI and his chronic substance use have likely exacerbated his symptoms.  Tried safety planning however he was unable to do so and endorse a plan to kill himself.  Will continue Latuda  and Remeron  at the same dose, restart Wellbutrin  for depression.  He was escorted downstairs at the urgent care for further evaluation and management.   Risk Assessment: An assessment of suicide and violence risk factors was performed as part of this evaluation and is significantly changed from the last visit. While future psychiatric events cannot be accurately predicted, the patient currently requires acute inpatient psychiatric care and does not currently meet [place]  involuntary commitment criteria. Patient was escorted to the [facility] for care. Patient was given contact information for crisis resources, behavioral health clinic and was instructed to call 911 for emergencies.   Plan: # History of TBI Cocaine induced psychotic disorder History of bipolar disorder Past medication trials:  Status of problem: Current Interventions: -- Continue Latuda  60 mg daily with dinner  # MDD, recurrent, moderate/GAD Past medication trials:  Status of problem: Current Interventions: -- Continue Remeron  15 mg nightly for sleep -- Restart Wellbutrin  150 mg daily  # History of cocaine use Past medication trials:  Status of problem: Last Interventions: -- Encouraged continued sobriety -- Encouraged psychotherapy  Chief Complaint:  Chief Complaint  Patient presents with   Follow-up  Medication Refill   HPI:  Randall Parsons is a 44 year old male with a history of cocaine use, marijuana use, heroin use and fentanyl  use disorder, GAD, depression, ADHD that presented to the clinic today for a follow-up. He reported his mood as not good .  Reported that  yesterday I tried to admit myself to the hospital as I have been hearing voices, having suicidal ideations with a plan to cut my wrist .  Reported that he has previously tried to commit suicide 20 years ago I cut my wrist was a slow death .  Reported that he was admitted to the hospital and stabilized and has not hurt himself after that.  However reported a plan buying a blade and cutting his wrist.  He denied any homicidal ideations, denied any visual hallucinations.  He endorsed auditory hallucinations stating I will get arrested, I am a child molester , stated that this started after his TBI 2 years ago voices are inside my head, not my voices .  Also reported that he was using cocaine, had worsening of the voices but has been sober for the past 1 year, denied using any substance including alcohol  or cigarettes. Also reported feeling depressed with decreased interest in daily activities, reduced appetite, decreased energy, reduced concentration, poor sleep and recurrent suicidal ideations that have been chronically present over the past 2 years.  He stated that he was in the hospital in [facility] and they discontinued his lamotrigine  and Wellbutrin  stated that it was likely worsening my symptoms , stated that it made my depression worse . He reported that he is living at the [facility] house I do not feel safe.,  Stated that he is working with his case manager to get to a higher level of care . Stated that he has been taking his medications as prescribed, denied any physical concerns.  He denied any side effects to medications.  He was not able to safety plan, scored 24 on PHQ-9 and was escorted down to the urgent care for management.   Past Psychiatric History:  polysubstance use (cocaine, heroin, marijuana, fentanyl ), anxiety, depression, ADHD (childhood- reports was on ritalin and adderall).   Past Medical History:  Past Medical History:  Diagnosis Date   Anxiety    Asthma    CHF  (congestive heart failure) (HCC)    Depression    Exposure to hepatitis B    Exposure to hepatitis C    Hepatitis C    History of kidney stones    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, ending around 2017   Hypertension    IV drug abuse (HCC)    Myocardial infarction (HCC)    Renal disorder    kidney stones   Stroke [facility])     Past Surgical History:  Procedure Laterality Date   APPENDECTOMY     EYE SURGERY     secondary to dog bite   I & D EXTREMITY Right 01/31/2023   Procedure: IRRIGATION AND DEBRIDEMENT INDEX FINGER;  Surgeon: Murrell Drivers, MD;  Location: [facility] OR;  Service: Orthopedics;  Laterality: Right;   TIBIA FRACTURE SURGERY Right     Family Psychiatric History:  Father alcohol  use, Bipolar disorder mother   Family History:  Family History  Problem Relation Age of Onset   Alcohol  abuse Father    Melanoma Mother    Breast cancer Mother    Colon cancer Neg Hx    Prostate cancer Neg Hx  Social History:  Social History   Socioeconomic History   Marital status: Married    Spouse name: Not on file   Number of children: Not on file   Years of education: Not on file   Highest education level: Not on file  Occupational History   Not on file  Tobacco Use   Smoking status: Former   Smokeless tobacco: Never  Vaping Use   Vaping status: Every Day  Substance and Sexual Activity   Alcohol  use: No   Drug use: Not Currently    Types: IV, Cocaine    Comment: hx of heroin abuse (opioid addiction), crack   Sexual activity: Not on file  Other Topics Concern   Not on file  Social History Narrative   Living with neighbor as of 2024.     Divorced.     Has 5 step kids (2 at home).  From [facility].     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     Social Drivers of Health   Tobacco Use: Medium Risk (10/09/2024)   Received from [facility]   Patient History    Smoking Tobacco Use: Former    Smokeless Tobacco Use: Former    Passive  Exposure: Not on Actuary Strain: Not on file  Food Insecurity: No Food Insecurity (10/06/2024)   Epic    Worried About Programme Researcher, Broadcasting/film/video in the Last Year: Never true    Ran Out of Food in the Last Year: Never true  Recent Concern: Food Insecurity - Food Insecurity Present (07/25/2024)   Epic    Worried About Programme Researcher, Broadcasting/film/video in the Last Year: Often true    Ran Out of Food in the Last Year: Often true  Transportation Needs: No Transportation Needs (10/06/2024)   Epic    Lack of Transportation (Medical): No    Lack of Transportation (Non-Medical): No  Physical Activity: Not on file  Stress: Not on file  Social Connections: Not on file  Depression (PHQ2-9): High Risk (10/19/2024)   Depression (PHQ2-9)    PHQ-2 Score: 24  Alcohol  Screen: Not on file  Housing: Not on file  Utilities: Not At Risk (10/06/2024)   Epic    Threatened with loss of utilities: No  Recent Concern: Utilities - At Risk (07/25/2024)   Epic    Threatened with loss of utilities: Yes  Health Literacy: Not on file    Allergies: Allergies[1]  Current Medications: Current Outpatient Medications  Medication Sig Dispense Refill   buPROPion  (WELLBUTRIN  XL) 150 MG 24 hr tablet Take 1 tablet (150 mg total) by mouth daily. 30 tablet 1   hydrOXYzine  (ATARAX ) 50 MG tablet Take 1 tablet (50 mg total) by mouth 3 (three) times daily as needed for anxiety. 30 tablet 0   ibuprofen  (ADVIL ) 800 MG tablet Take 1 tablet (800 mg total) by mouth 3 (three) times daily. 30 tablet 0   lamoTRIgine  (LAMICTAL ) 200 MG tablet Take 1 tablet (200 mg total) by mouth daily. 30 tablet 0   loperamide  (IMODIUM  A-D) 2 MG tablet Take 1 tablet (2 mg total) by mouth 4 (four) times daily as needed for diarrhea or loose stools. 30 tablet 0   lurasidone  60 MG TABS Take 1 tablet (60 mg total) by mouth daily after supper. 30 tablet 1   mirtazapine  (REMERON ) 15 MG tablet Take 1 tablet (15 mg total) by mouth at bedtime. 30 tablet 1    prazosin  (MINIPRESS ) 1 MG capsule Take 1 capsule (  1 mg total) by mouth at bedtime. 30 capsule 0   No current facility-administered medications for this visit.     Musculoskeletal: Strength & Muscle Tone: within normal limits Gait & Station: normal Patient leans: N/A  Psychiatric Specialty Exam: Blood pressure 125/84, pulse 82, height 5' 5 (1.651 m), weight 195 lb (88.5 kg).Body mass index is 32.45 kg/m. Review of Systems  General Appearance: Casual and Fairly Groomed  Eye Contact:  Good  Speech:  Clear and Coherent and Normal Rate  Volume:  Normal  Mood:  Euthymic  Affect:  Congruent  Thought Content: Logical   Suicidal Thoughts:  No  Homicidal Thoughts:  No  Thought Process:  Coherent  Orientation:  Full (Time, Place, and Person)    Memory: Immediate;   Good Recent;   Good  Judgment:  Fair  Insight:  Fair  Concentration:  Concentration: Good and Attention Span: Good  Recall:  not formally assessed   Fund of Knowledge: Good  Language: Good  Psychomotor Activity:  Normal  Akathisia:  No  AIMS (if indicated): not done  Assets:  Communication Skills Desire for Improvement Financial Resources/Insurance Housing Intimacy Leisure Time Resilience  ADL's:  Intact  Cognition: WNL  Sleep:  Good   Metabolic Disorder Labs: Lab Results  Component Value Date   HGBA1C 5.9 (H) 12/17/2021   MPG 123 12/17/2021   MPG 119.76 12/03/2020   Lab Results  Component Value Date   PROLACTIN 6.8 07/24/2024   PROLACTIN 8.9 01/04/2021   Lab Results  Component Value Date   CHOL 220 (H) 10/06/2024   TRIG 126 10/06/2024   HDL 45 10/06/2024   CHOLHDL 4.9 10/06/2024   VLDL 25 10/06/2024   LDLCALC 150 (H) 10/06/2024   LDLCALC 130 (H) 07/24/2024   Lab Results  Component Value Date   TSH 4.174 07/24/2024   TSH 0.686 12/17/2021    Therapeutic Level Labs: No results found for: LITHIUM No results found for: VALPROATE No results found for: CBMZ   Screenings: AUDIT     Flowsheet Row Admission (Discharged) from 07/14/2019 in [facility] INPATIENT BEHAVIORAL MEDICINE  Alcohol  Use Disorder Identification Test Final Score (AUDIT) 21   GAD-7    Flowsheet Row Office Visit from 08/23/2024 in [facility] Office Visit from 07/22/2023 in [facility] [place] Office Visit from 07/11/2023 in [facility]  Total GAD-7 Score 21 6 0   PHQ2-9    Flowsheet Row Clinical Support from 10/19/2024 in [facility] ED from 10/06/2024 in [facility] Office Visit from 08/23/2024 in [facility] ED from 07/25/2024 in [facility] Office Visit from 07/22/2023 in [facility] [place]  PHQ-2 Total Score 6 1 3 2  0  PHQ-9 Total Score 24 7 20 7 2    Flowsheet Row ED from 10/19/2024 in [facility] Most recent reading at 10/19/2024  9:03 AM Clinical Support from 10/19/2024 in [facility] Most recent reading at 10/19/2024  8:24 AM ED from 10/18/2024 in [facility] Most recent reading at 10/18/2024  8:31 AM  C-SSRS RISK CATEGORY High Risk High Risk Moderate Risk    Collaboration of Care: Collaboration of Care: Medication Management AEB Dr. Susen, previous psychiatric admission notes  Patient/Guardian was advised Release of Information must be obtained prior to any record release in order to collaborate their care  with an outside provider. Patient/Guardian was advised if they have not already done so to contact the registration department to sign all necessary forms in order for us  to release information regarding their care.   Consent: Patient/Guardian gives verbal consent for treatment and assignment of benefits for services provided during this visit. Patient/Guardian expressed understanding  and agreed to proceed.    Clifton Safley, MD 10/19/2024, 11:23 AM       [1]  Allergies Allergen Reactions   Amoxicillin Hives and Other (See Comments)    Did it involve swelling of the face/tongue/throat, SOB, or low BP? No Did it involve sudden or severe rash/hives, skin peeling, or any reaction on the inside of your mouth or nose? Yes Did you need to seek medical attention at a hospital or doctor's office? Yes When did it last happen?     44 yrs old If all above answers are NO, may proceed with cephalosporin use.    Prednisone  Other (See Comments)    irritable

## 2024-10-19 NOTE — Addendum Note (Signed)
 Addended by: Jasson Siegmann on: 10/19/2024 11:24 AM   Modules accepted: Orders

## 2024-10-19 NOTE — Progress Notes (Signed)
" °   10/19/24 0859  [facility] Triage Screening (Walk-ins at [facility] only)  What Is the Reason for Your Visit/Call Today? Health Randall Parsons 43y male presents to [facility] as a voluntary walk-in, unaccompanied. PT has been experiencing SI w/ a plan to slit his wrists. PT endorses auditory hallucinations; voices telling him to go ahead and get it over with. PT denies HI, VH and alcohol  and substance use.  How Long Has This Been Causing You Problems? 1 wk - 1 month  Have You Recently Had Any Thoughts About Hurting Yourself? Yes  How long ago did you have thoughts about hurting yourself? This morning, for the past couple of weeks  Are You Planning to Commit Suicide/Harm Yourself At This time? Yes (Slit wrist (again))  Have you Recently Had Thoughts About Hurting Someone Sherral? No  Are You Planning To Harm Someone At This Time? No  Physical Abuse Denies  Verbal Abuse Denies  Sexual Abuse Denies  Exploitation of patient/patient's resources Denies  Self-Neglect Denies  Are you currently experiencing any auditory, visual or other hallucinations? Yes  Please explain the hallucinations you are currently experiencing: auditory (voices telling pt to go ahead and get it over with, life isn't worth living)  Have You Used Any Alcohol  or Drugs in the Past 24 Hours? No  Do you have any current medical co-morbidities that require immediate attention? No  Clinician description of patient physical appearance/behavior: flat, cooperative, polite  What Do You Feel Would Help You the Most Today? Treatment for Depression or other mood problem  Determination of Need Emergent (2 hours)  Options For Referral Outpatient Therapy;BH Urgent Care;Intensive Outpatient Therapy  Determination of Need filed? Yes    "

## 2024-10-19 NOTE — ED Provider Notes (Signed)
 Behavioral Health Urgent Care Medical Screening Exam  Patient Name: Randall Parsons MRN: [id number] Date of Evaluation: 10/19/2024 Chief Complaint:   Diagnosis:  Final diagnoses:  Adjustment disorder with mixed anxiety and depressed mood    History of Present Illness: Randall Parsons is a 44 y.o. male with reported prior psychiatric diagnoses of bipolar disorder, GAD, polysubstance use, insomnia and personality d/o who presents to [facility] with complaints of suicidal ideation with plan to slit his wrist and command auditory hallucinations telling him to get it over with. Patient was seen yesterday morning at [facility] as well for similar complaints of depressive symptoms and suicidal ideation related to situational factors, primarily patient's living situation at [facility]. Patient was suspected to be reporting symptoms for secondary gain of getting admitted to inpatient unit so that he would not have to return to [facility]. During that encounter, patient was appropriately scheduled with an outpatient appointment at [facility] the following day at [facility] for medication management. Patient presents again today at [facility] immediately following his outpatient appointment, and is reporting ongoing suicidal ideation.  Patient states he feels depressed and is having thoughts of wanting to harm himself by cutting his wrist with a razorblade. States he's currently living at [facility] and does not feel he can be safe there due to his thoughts. States he's been having intense thoughts over the past 2 weeks. Notably, patient was recently hospitalized at [facility] psychiatric unit for depression and SI from 10/09/24-10/15/24. Patient states while there his Wellbutrin  and Lamictal  were discontinued, and he has been feeling worse since they were stopped. Patient also reports having auditory hallucinations telling him to do it today, referring to harming himself. He states he's been  hearing voices over the past year and they are like a demon telling him to get it over with. States he has these thoughts throughout the day. He denies HI.  Patient endorses one previous suicide attempt 20 years ago in which he slit his left wrist, requiring staples. Reports during that time he was feeling depressed. He states he's been diagnosed with bipolar, schizophrenia, but appears unsure why or when he was given these diagnoses. He does not currently have a therapist, but is interested in therapy, but states he cannot make it to therapy three times a week because [name] won't provide transportation that often. 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 He reports a family history of mother with bipolar and father with severe depression and alcohol  use disorder. Reports both of his parents are deceased. He has an ex-wife that he keeps in contact with.  When asked how he feels inpatient hospitalization would be beneficial, patient responds I could go there and try to work on housing and finding somewhere else to stay. Patient reports the primary source of his mood symptoms are his current living situation. Discussed with patient that an inpatient psychiatric hospitalization is not a prerequisite to finding a new living situation, and this goal can be accomplished directly through his case-worker. Patient reports he has an assigned case-worker through [facility].  Based on extensive chart review, it appears over the past several months patient has a pattern of presenting to emergency care with complaints of suicidal ideation in the setting of homelessness or unfavorable living situation, and is admitted to an inpatient facility where he attempts to get resources and help to find an alternate disposition option. It does not appear patient has followed up previously with any  recommendations for outpatient psychiatric care or follow-up after disposition. Strongly suspect with  his ongoing dissatisfaction with his current living situation, patient is exaggerating symptoms for secondary gain in an attempt to get some time away from [facility] and to find alternative living arranagements. Therefore, do not believe patient is appropriate for inpatient admission at this time as the root of patient's complaint appear to be situational and housing-based, which cannot be adequately addressed through hospitalization. Although patient has reported suicidal ideation on presentation to multiple facilities over the past several months, he has not engaged in any suicidal behavior during this entire period and his last reported suicide attempt was over twenty years and of mild severity.  Patient is now established with [facility] for medication management and can also access therapy services there. Encouraged him to follow-up with his psychiatrist there if he would like to restart Wellbutrin  or Lamictal , as he apparently has found this helpful previously. Patient would likely also benefit from individual therapy to develop healthy coping skills. Encouraged patient to reach out to his case-worker through [facility] for housing resources or alternative housing arrangements.    Flowsheet Row ED from 10/19/2024 in [facility] Most recent reading at 10/19/2024  9:03 AM Clinical Support from 10/19/2024 in [facility] Most recent reading at 10/19/2024  8:24 AM ED from 10/18/2024 in [facility] Most recent reading at 10/18/2024  8:31 AM  C-SSRS RISK CATEGORY High Risk High Risk Moderate Risk    Psychiatric Specialty Exam  Presentation  General Appearance:Appropriate for Environment; Casual  Eye Contact:Good  Speech:Clear and Coherent; Normal Rate  Speech Volume: Normal  Handedness: Right   Mood and Affect  Mood: Depressed  Affect: Full Range; Non-Congruent   Thought Process  Thought  Processes: Coherent; Goal Directed; Linear  Descriptions of Associations:Intact  Orientation:Full (Time, Place and Person)  Thought Content:Logical  Diagnosis of Schizophrenia or Schizoaffective disorder in past: No   Hallucinations:Auditory no overt signs of psychosis-AH are chronic in nature and not new (started 2.5 months ago)  Ideas of Reference:None  Suicidal Thoughts:Yes, Passive With Intent; Without Plan Without Means to Carry Out; Without Intent; With Plan  Homicidal Thoughts:No   Sensorium  Memory: Immediate Fair  Judgment: Fair  Insight: Lacking   Executive Functions  Concentration: Fair  Attention Span: Fair  Recall: Fair  Fund of Knowledge: Good  Language: Good   Psychomotor Activity  Psychomotor Activity: Normal   Assets  Assets: Physical Health   Sleep  Sleep: Fair  Number of hours:  7   Physical Exam Vitals and nursing note reviewed.  Constitutional:      General: He is not in acute distress.    Appearance: Normal appearance. He is normal weight. He is not ill-appearing.  HENT:     Head: Normocephalic and atraumatic.  Pulmonary:     Effort: Pulmonary effort is normal.     Breath sounds: Normal breath sounds.  Musculoskeletal:        General: Normal range of motion.  Skin:    General: Skin is warm and dry.  Neurological:     General: No focal deficit present.     Mental Status: [name] to person, place, and time. Mental status is at baseline.    Review of Systems  Gastrointestinal:  Negative for abdominal pain, constipation, diarrhea, nausea and vomiting.  Neurological:  Negative for dizziness and headaches.  All other systems reviewed and are  negative.  Blood pressure 119/83, pulse 66, temperature 98.8 F (37.1 C), temperature source Oral, resp. rate 17, SpO2 97%. There is no height or weight on file to calculate BMI.  Musculoskeletal: Strength & Muscle Tone: within normal limits Gait &  Station: normal Patient leans: N/A  BH[facility] MSE Discharge Disposition for Follow up and Recommendations: Based on my evaluation, the patient does not appear to have an emergency medical condition and can be discharged with resources and follow-up care in outpatient services for medication management and individual therapy. Patient has established care with [facility]. Recommend follow-up with outpatient psychiatrist for further medication management. Patient would also benefit from individualized therapy. Recommend patient follow-up with established case-worker through [facility] for housing resources.  Ashley LOISE Gravely, MD 10/19/2024, 11:06 AM

## 2024-10-19 NOTE — Discharge Instructions (Addendum)
 Please follow-up with your case-worker through [facility] for housing resources. Please follow-up with your outpatient psychiatrist Dr. Kapoor for further medication management.

## 2024-10-20 NOTE — Progress Notes (Signed)
" ° °  THERAPIST PROGRESS NOTE  Session Time: 30 min  Participation Level: Active  Behavioral Response: CasualAlertDepressed  Type of Therapy: Individual Therapy  Treatment Goals addressed: client is presenting to discuss options to address medication management  ProgressTowards Goals: Progressing  Interventions: CBT  Summary:  Randall Parsons is a 44 y.o. male who presents as a walk in to speak with a counselor. Client presented oriented times five, appropriately dressed and friendly. Client denied hallucinations and delusions. Client reported clenching his teeth and nervousness and his leg is shaking most of the day, not sleeping well maybe 2 hours. Client reporte ot makes for a hard day and he is at [facility] house and he is up at 5a and work until [employer]. Client reported he has passive suicidal ideal. Client reported he feels the medication is making his depression worse.client reported lamictal  and welbutrin previously helped but once other meds were adding he feels it lessened the effectiveness. Client reported previously cutting his wrist at age 4 and thinks that would be. Client reported thoughts shoot through his mind of passive S.I but has no intent to harm himself. Client is seen clenching the right side of his mouth. Client reported the 80mg  increase for Latuda  the clenching happened and then fatigue has increased as well.  Client reported living at [facility] house has been good but living with so many men is a challenge. Client reported he recently went to the funeral of the director of [facility] which was hard because he does not deal with death well. Client reported hx of cocaine. Evidence of progress towards goal:  client reported 1 concern of medication side effective causing unwanted leg movements and he is not sleeping.  Suicidal/Homicidal: Nowithout intent/plan  Therapist Response:  Therapist began the appointment making introduction and discussing  confidentiality. Therapist engaged using active listening and positive emotional support. Therapist used cbt to ask open ended questions about presenting concerns. Therapist used cbt to positively support the concerns/ emotions within reason. Therapist used cbt to engage and discuss options for seeing a provide to evaluate his medication regimen. Therapist assigned the client homework to practice self care.   Plan: Return again in 3 weeks.  Diagnosis: bipolar 1 disorder, most recent episode, depressed  Collaboration of Care: Medication Management AEB therapist discussed walk in hours for medication management as well as access to urgent care.  Patient/Guardian was advised Release of Information must be obtained prior to any record release in order to collaborate their care with an outside provider. Patient/Guardian was advised if they have not already done so to contact the registration department to sign all necessary forms in order for us  to release information regarding their care.   Consent: Patient/Guardian gives verbal consent for treatment and assignment of benefits for services provided during this visit. Patient/Guardian expressed understanding and agreed to proceed.   Rashad Auld Y Dakwan Pridgen, LCSW 10/06/2024  "

## 2024-10-26 ENCOUNTER — Ambulatory Visit (HOSPITAL_COMMUNITY)
Admission: EM | Admit: 2024-10-26 | Discharge: 2024-10-26 | Disposition: A | Discharge status: Home / Self Care | Payer: MEDICAID | Attending: Nurse Practitioner | Admitting: Nurse Practitioner

## 2024-10-26 DIAGNOSIS — F411 Generalized anxiety disorder: Secondary | ICD-10-CM | POA: Insufficient documentation

## 2024-10-26 DIAGNOSIS — G4700 Insomnia, unspecified: Secondary | ICD-10-CM | POA: Insufficient documentation

## 2024-10-26 DIAGNOSIS — F330 Major depressive disorder, recurrent, mild: Secondary | ICD-10-CM | POA: Insufficient documentation

## 2024-10-26 NOTE — Discharge Summary (Signed)
 Randall Parsons to be discharged Home per NP order. Discussed with the patient and all questions fully answered. An After Visit Summary was printed and given to the patient. Patient escorted out and discharged home via private auto.  [name]  10/26/2024 8:46 AM

## 2024-10-26 NOTE — Progress Notes (Signed)
" °   10/26/24 0747  BHUC Triage Screening (Walk-ins at [facility] only)  What Is the Reason for Your Visit/Call Today? Health Mcphillips 43y male presents to [facility] unaccompanied. PT is having trouble w/ his medications and would like to see a provider. PT feels his meds are not working. PT states I'm super depressed, hopeless...morose...paranoid.  How Long Has This Been Causing You Problems? 1 wk - 1 month  Have You Recently Had Any Thoughts About Hurting Yourself? Yes  How long ago did you have thoughts about hurting yourself? Today  Are You Planning to Commit Suicide/Harm Yourself At This time? Yes  Have you Recently Had Thoughts About Hurting Someone Sherral? No  Are You Planning To Harm Someone At This Time? No  Physical Abuse Denies  Verbal Abuse Denies  Sexual Abuse Denies  Exploitation of patient/patient's resources Denies  Self-Neglect Yes, present (Comment)  Are you currently experiencing any auditory, visual or other hallucinations? Yes  Please explain the hallucinations you are currently experiencing: hearing stuff in my head, definitely  Have You Used Any Alcohol  or Drugs in the Past 24 Hours? No  Do you have any current medical co-morbidities that require immediate attention? No  Clinician description of patient physical appearance/behavior: sad, cooperative, layers of clothing  What Do You Feel Would Help You the Most Today? Medication(s);Treatment for Depression or other mood problem  Determination of Need Urgent (48 hours)  Options For Referral Medication Management  Determination of Need filed? Yes    "

## 2024-10-26 NOTE — Discharge Instructions (Signed)

## 2024-10-26 NOTE — ED Provider Notes (Addendum)
 Behavioral Health Urgent Care Medical Screening Exam  Patient Name: Randall Parsons MRN: [id number] Date of Evaluation: 10/26/24 Chief Complaint: Depressive symptoms   Diagnosis:  Final diagnoses:  Mild episode of recurrent major depressive disorder   History of Present illness: Randall Parsons is a 44 y.o.Caucasian male with a history of MDD, GAD & insomnia who presented to the [facility] today, 1/20 with complaints of depressive symptoms, and seeking medication management.  Assessment & ROS: During encounter with patient, he verbalized depressive symptoms which started since his presentation to the [facility], and have gradually worsened over time. Pt has presented here in the past, and this presentation is similar, with patient speaking as if he is reading from a script, seems to be stating what he thinks clinical research associate wants to hear in order to enable an inpatient hospitalization; lists all of the depressive symptoms; insomnia, anhedonia, trouble with concentration, appetite, low mood, mental clouding, decreased motivation, psychomotor retardation, and feelings of worthlessness & hopelessness. He talks about feeling like he wants to take myself out, states that his meditations are not helpful, states that he is on Latuda  & Lamictal  at this time and has been on those for several months now, and that they are no longer helping him.   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 There is no therapy there or anything like that. They don't do any of that for us . 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 Talks about wanting a higher level of care, unable to tell writer what that entails when asked. We talked about his stay  at the [facility], his struggles with his day to day life, his lack of his family of his own, etc, which he states are some of the things that are bothersome to him. He endorsed passive SI, but only in the context of him setill residing at the [facility] and not having better housing options, and him not having a good support system as well as other socioeconomic stressors. Reports AH, but admits that they are chronic, and only started since [facility] house, and have not changed since his last presentation here at the [facility].  Pt is provided with active listening and positive reinforcements to continue engaging in his own life, and letting his treatment team know how to best assist him. Pt is educated that his outpatient  providers will not be able to assist him unless he is forthcoming regarding what is working for him and what is not working. Pt is also applauded for his longevity with his sobriety. We talked about some of the ways by which he can manage stress in the community such as taking walks, listening to music, etc, and he was receptive. Writer called the second floor clinic, and scheduled a therapy appointment for patient for tomorrow, 1/21 at 9am, and he will present there to be set up for a virtual appt using a computer as he states that he will be unable to log on from the [facility].  Patient has also been scheduled for a med management appt and educated to let his outpatient provider know if meds are not helping and has verbalized understanding. Denies that  his passive SI has intensified since his last presentation to this location. Denies that anything else has changed. Denies intent or plan to harm himself or anyone else after leaving this location. Provided with a bus pass to return to the [facility] and agreeable to return here tomorrow upstairs for his therapy appt. Also agreeable to return sooner should he start experiencing SI/HI or VH/paranoid ideations.  Flowsheet Row ED from  10/26/2024 in [facility] Most recent reading at 10/26/2024  7:53 AM ED from 10/19/2024 in [facility] Most recent reading at 10/19/2024  9:03 AM Clinical Support from 10/19/2024 in [facility] Most recent reading at 10/19/2024  8:24 AM  C-SSRS RISK CATEGORY High Risk High Risk High Risk    Psychiatric Specialty Exam  Presentation  General Appearance:Casual  Eye Contact:Minimal; Good  Speech:Clear and Coherent  Speech Volume:Normal  Handedness:Right   Mood and Affect  Mood: Euthymic  Affect: Congruent   Thought Process  Thought Processes: Coherent  Descriptions of Associations:Intact  Orientation:Full (Time, Place and Person)  Thought Content:Logical  Diagnosis of Schizophrenia or Schizoaffective disorder in past: No   Hallucinations:None no overt signs of psychosis-AH are chronic in nature and not new (started 2.5 months ago)  Ideas of Reference:None  Suicidal Thoughts:Yes, Passive With Intent; Without Plan Without Intent; Without Plan  Homicidal Thoughts:No   Sensorium  Memory: Immediate Fair  Judgment: Fair  Insight: Fair   Chartered Certified Accountant: Fair  Attention Span: Fair  Recall: Fair  Fund of Knowledge: Fair  Language: Good   Psychomotor Activity  Psychomotor Activity: Normal   Assets  Assets: Desire for Improvement; Resilience   Sleep  Sleep: Good  Number of hours:  7   Physical Exam: Physical Exam Vitals reviewed.  Musculoskeletal:     Cervical back: Normal range of motion.  Psychiatric:        Behavior: Behavior normal.        Thought Content: Thought content normal.        Judgment: Judgment normal.    Review of Systems  Constitutional: Negative.   HENT: Negative.    Respiratory: Negative.    Cardiovascular: Negative.   Gastrointestinal: Negative.   Skin: Negative.   Neurological: Negative.    Psychiatric/Behavioral:  Positive for depression (stable for management outpatient) and suicidal ideas (passive SI, no plan, contracts for safety outpatient). Negative for hallucinations, memory loss and substance abuse. The patient is not nervous/anxious and does not have insomnia.   All other systems reviewed and are negative.  Blood pressure 128/84, pulse 78, temperature 97.9 F (36.6 C), temperature source Oral, resp. rate 17, SpO2 97%. There is no height or weight on file to calculate BMI.  Musculoskeletal: Strength & Muscle Tone: within normal limits Gait & Station: normal Patient leans: N/A   BH[facility] MSE Discharge Disposition for Follow up and Recommendations: Based on my evaluation the patient does not appear to have an emergency medical condition and can be discharged with resources and follow up care in outpatient services for Medication Management and Individual Therapy  Appointments have been scheduled for outpatient for both med management and therapy Donia Snell, NP 10/26/2024, 10:32 AM

## 2024-10-27 ENCOUNTER — Ambulatory Visit (INDEPENDENT_AMBULATORY_CARE_PROVIDER_SITE_OTHER): Payer: MEDICAID

## 2024-10-27 DIAGNOSIS — F3130 Bipolar disorder, current episode depressed, mild or moderate severity, unspecified: Secondary | ICD-10-CM

## 2024-10-27 DIAGNOSIS — F331 Major depressive disorder, recurrent, moderate: Secondary | ICD-10-CM

## 2024-10-27 DIAGNOSIS — F411 Generalized anxiety disorder: Secondary | ICD-10-CM

## 2024-10-27 NOTE — Progress Notes (Unsigned)
 " THERAPIST PROGRESS NOTE  Virtual Visit via Video Note  I connected with Randall Parsons on 10/27/24 at  9:00 AM EST by a video enabled telemedicine application and verified that I am speaking with the correct person using two identifiers.  Location: Patient: [facility] Provider: Remote office   I discussed the limitations of evaluation and management by telemedicine and the availability of in person appointments. The patient expressed understanding and agreed to proceed.   I discussed the assessment and treatment plan with the patient. The patient was provided an opportunity to ask questions and all were answered. The patient agreed with the plan and demonstrated an understanding of the instructions.   The patient was advised to call back or seek an in-person evaluation if the symptoms worsen or if the condition fails to improve as anticipated.  I provided 54 minutes of non-face-to-face time during this encounter.   Randall Parsons L. Renae Mottley, LCSW   Session Time: 9:01am - 9:55am  Participation Level: Active  Behavioral Response: NA Alert Euthymic  Type of Therapy: Individual Therapy  Treatment Goals addressed: Treatment plan will be developed at next session  ProgressTowards Goals: Treatment plan will be developed at next session  Interventions: CBT, Motivational Interviewing, and Supportive  Summary: Randall Parsons is a 44 y.o. male who presents at [facility] following a referral from the ED, where he was assessed on October 26, 2024, for suicidal ideations. Although he reports that these ideations have subsided, he continues to experience feelings of depression. He denies any intent to harm himself (HI). Randall Parsons has been diagnosed with Bipolar I disorder, Generalized Anxiety Disorder (GAD), and a moderate episode of recurrent major depressive disorder. 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 Currently residing  in [facility], he receives housing, food, and the opportunity to work, which he finds enjoyable and fulfilling. Despite some complaints from others about compensation, he views his situation as a positive source of support. Randall Parsons describes his ex-wife as a significant source of support in his life. He reports symptoms such as lack of motivation, low mood, and feelings of worthlessness.He was scheduled for a walk-in slot to accommodate seeing him earlier than this therapists first available appointment. A safety plan was completed, and he agreed to return sooner if he starts experiencing suicidal ideations (SI), homicidal ideations (HI), or visual/hallucinations (VH) and paranoid ideations. During the session, Randall Parsons was pleasant, actively engaged, and alert throughout the assessment.  Suicidal/Homicidal: No without intent/plan  Therapist Response: the therapist supported Randall Parsons through Cognitive Behavioral Therapy (CBT), Randall Parsons identified and challenged negative thought patterns related to his feelings of worthlessness and low mood, aiming to develop healthier coping strategies. Motivational Interviewing (MI) facilitated a collaborative dialogue that encouraged Randall Parsons to explore his motivations for change, reinforcing his autonomy. Active listening created a safe space for him to express his feelings without judgment, while validation affirmed the importance of his emotions. The therapist praised [name]'s one year of sobriety, acknowledging it as a significant accomplishment for his self-esteem and motivation. Throughout the session, Randall Parsons was pleasant, engaged, and willing to participate in his therapy journey.  Plan: Return again in 1 weeks.  Diagnosis:   Bipolar I disorder, most recent episode depressed (HCC)  GAD (generalized anxiety disorder)  Moderate episode of recurrent major depressive disorder (HCC)  Collaboration of Care: Other None  Patient/Guardian was advised Release of Information  must be obtained prior to any record release in  order to collaborate their care with an outside provider. Patient/Guardian was advised if they have not already done so to contact the registration department to sign all necessary forms in order for us  to release information regarding their care.   Consent: Patient/Guardian gives verbal consent for treatment and assignment of benefits for services provided during this visit. Patient/Guardian expressed understanding and agreed to proceed.   Randall Nahm L. Robynn, LCSW  10/27/2024  "

## 2024-11-04 ENCOUNTER — Ambulatory Visit (INDEPENDENT_AMBULATORY_CARE_PROVIDER_SITE_OTHER): Payer: MEDICAID

## 2024-11-04 ENCOUNTER — Ambulatory Visit (HOSPITAL_COMMUNITY)
Admission: EM | Admit: 2024-11-04 | Discharge: 2024-11-05 | Disposition: A | Discharge status: Home / Self Care | Payer: MEDICAID | Attending: Psychiatry | Admitting: Psychiatry

## 2024-11-04 DIAGNOSIS — R443 Hallucinations, unspecified: Secondary | ICD-10-CM

## 2024-11-04 DIAGNOSIS — F3130 Bipolar disorder, current episode depressed, mild or moderate severity, unspecified: Secondary | ICD-10-CM

## 2024-11-04 DIAGNOSIS — F22 Delusional disorders: Secondary | ICD-10-CM

## 2024-11-04 DIAGNOSIS — F411 Generalized anxiety disorder: Secondary | ICD-10-CM

## 2024-11-04 LAB — URINALYSIS, ROUTINE W REFLEX MICROSCOPIC
Bilirubin Urine: NEGATIVE
Glucose, UA: NEGATIVE mg/dL
Hgb urine dipstick: NEGATIVE
Ketones, ur: NEGATIVE mg/dL
Leukocytes,Ua: NEGATIVE
Nitrite: NEGATIVE
Protein, ur: NEGATIVE mg/dL
Specific Gravity, Urine: 1.017 (ref 1.005–1.030)
pH: 5 (ref 5.0–8.0)

## 2024-11-04 LAB — COMPREHENSIVE METABOLIC PANEL WITH GFR
ALT: 31 U/L (ref 0–44)
AST: 25 U/L (ref 15–41)
Albumin: 4.4 g/dL (ref 3.5–5.0)
Alkaline Phosphatase: 75 U/L (ref 38–126)
Anion gap: 12 (ref 5–15)
BUN: 16 mg/dL (ref 6–20)
CO2: 26 mmol/L (ref 22–32)
Calcium: 9.2 mg/dL (ref 8.9–10.3)
Chloride: 100 mmol/L (ref 98–111)
Creatinine, Ser: 1.04 mg/dL (ref 0.61–1.24)
GFR, Estimated: >60 mL/min
Glucose, Bld: 82 mg/dL (ref 70–99)
Potassium: 4 mmol/L (ref 3.5–5.1)
Sodium: 138 mmol/L (ref 135–145)
Total Bilirubin: 0.3 mg/dL (ref 0.0–1.2)
Total Protein: 7.5 g/dL (ref 6.5–8.1)

## 2024-11-04 LAB — POCT URINE DRUG SCREEN - MANUAL ENTRY (I-SCREEN)
POC Amphetamine UR: NOT DETECTED
POC Buprenorphine (BUP): NOT DETECTED
POC Cocaine UR: NOT DETECTED
POC Marijuana UR: NOT DETECTED
POC Methadone UR: NOT DETECTED
POC Methamphetamine UR: NOT DETECTED
POC Morphine: NOT DETECTED
POC Oxazepam (BZO): NOT DETECTED
POC Oxycodone UR: NOT DETECTED
POC Secobarbital (BAR): NOT DETECTED

## 2024-11-04 LAB — CBC WITH DIFFERENTIAL/PLATELET
Abs Immature Granulocytes: 0.02 10*3/uL (ref 0.00–0.07)
Basophils Absolute: 0 10*3/uL (ref 0.0–0.1)
Basophils Relative: 1 %
Eosinophils Absolute: 0.2 10*3/uL (ref 0.0–0.5)
Eosinophils Relative: 4 %
HCT: 44.3 % (ref 39.0–52.0)
Hemoglobin: 14.7 g/dL (ref 13.0–17.0)
Immature Granulocytes: 0 %
Lymphocytes Relative: 34 %
Lymphs Abs: 1.9 10*3/uL (ref 0.7–4.0)
MCH: 30.1 pg (ref 26.0–34.0)
MCHC: 33.2 g/dL (ref 30.0–36.0)
MCV: 90.8 fL (ref 80.0–100.0)
Monocytes Absolute: 0.5 10*3/uL (ref 0.1–1.0)
Monocytes Relative: 9 %
Neutro Abs: 3 10*3/uL (ref 1.7–7.7)
Neutrophils Relative %: 52 %
Platelets: 324 10*3/uL (ref 150–400)
RBC: 4.88 MIL/uL (ref 4.22–5.81)
RDW: 13.9 % (ref 11.5–15.5)
WBC: 5.8 10*3/uL (ref 4.0–10.5)
nRBC: 0 % (ref 0.0–0.2)

## 2024-11-04 LAB — HEMOGLOBIN A1C
Hgb A1c MFr Bld: 5.9 % — ABNORMAL HIGH (ref 4.8–5.6)
Mean Plasma Glucose: 122.63 mg/dL

## 2024-11-04 LAB — MAGNESIUM: Magnesium: 2.1 mg/dL (ref 1.7–2.4)

## 2024-11-04 LAB — ETHANOL: Alcohol, Ethyl (B): <15 mg/dL

## 2024-11-04 LAB — TSH: TSH: 2.72 u[IU]/mL (ref 0.350–4.500)

## 2024-11-04 MED ORDER — LORAZEPAM 2 MG/ML IJ SOLN: 2.0000 mg | Freq: Three times a day (TID) | INTRAMUSCULAR | Status: DC | PRN

## 2024-11-04 MED ORDER — DIPHENHYDRAMINE HCL 50 MG PO CAPS
50.0000 mg | ORAL_CAPSULE | Freq: Three times a day (TID) | ORAL | Status: DC | PRN
  Administered 2024-11-04: 50 mg via ORAL
  Filled 2024-11-04: qty 1

## 2024-11-04 MED ORDER — CLOZAPINE 25 MG PO TABS: 50.0000 mg | ORAL_TABLET | Freq: Once | ORAL | Status: DC

## 2024-11-04 MED ORDER — HALOPERIDOL LACTATE 5 MG/ML IJ SOLN: 5.0000 mg | Freq: Three times a day (TID) | INTRAMUSCULAR | Status: DC | PRN

## 2024-11-04 MED ORDER — ACETAMINOPHEN 325 MG PO TABS: 650.0000 mg | ORAL_TABLET | Freq: Four times a day (QID) | ORAL | Status: DC | PRN

## 2024-11-04 MED ORDER — CLOZAPINE 25 MG PO TABS: 25.0000 mg | ORAL_TABLET | Freq: Once | ORAL | Status: DC

## 2024-11-04 MED ORDER — MAGNESIUM HYDROXIDE 400 MG/5ML PO SUSP: 30.0000 mL | Freq: Every day | ORAL | Status: DC | PRN

## 2024-11-04 MED ORDER — HALOPERIDOL 5 MG PO TABS
5.0000 mg | ORAL_TABLET | Freq: Three times a day (TID) | ORAL | Status: DC | PRN
  Administered 2024-11-04: 5 mg via ORAL
  Filled 2024-11-04: qty 1

## 2024-11-04 MED ORDER — ALUM & MAG HYDROXIDE-SIMETH 200-200-20 MG/5ML PO SUSP: 30.0000 mL | ORAL | Status: DC | PRN

## 2024-11-04 MED ORDER — CLOZAPINE 25 MG PO TABS
25.0000 mg | ORAL_TABLET | Freq: Once | ORAL | Status: AC
  Administered 2024-11-04: 25 mg via ORAL
  Filled 2024-11-04: qty 1

## 2024-11-04 MED ORDER — IBUPROFEN 600 MG PO TABS
600.0000 mg | ORAL_TABLET | Freq: Once | ORAL | Status: AC
  Administered 2024-11-04: 600 mg via ORAL
  Filled 2024-11-04: qty 1

## 2024-11-04 MED ORDER — DIPHENHYDRAMINE HCL 50 MG/ML IJ SOLN: 50.0000 mg | Freq: Three times a day (TID) | INTRAMUSCULAR | Status: DC | PRN

## 2024-11-04 MED ORDER — HALOPERIDOL LACTATE 5 MG/ML IJ SOLN: 10.0000 mg | Freq: Three times a day (TID) | INTRAMUSCULAR | Status: DC | PRN

## 2024-11-04 MED ORDER — CLOZAPINE 25 MG PO TABS: 75.0000 mg | ORAL_TABLET | Freq: Once | ORAL | Status: DC

## 2024-11-04 MED ORDER — CLOZAPINE 25 MG PO TABS
12.5000 mg | ORAL_TABLET | Freq: Once | ORAL | Status: AC
  Administered 2024-11-04: 12.5 mg via ORAL
  Filled 2024-11-04: qty 1
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
## 2024-11-04 NOTE — ED Notes (Signed)
 Patient admitted to continuous observation unit d/t AH, denies SI/HI and VH, denies CAH. Calm, cooperative throughout interview process. Skin assessment completed. Oriented to unit. Meal and drink provided. Patient alert & oriented x4. Patient reports chronic back pain rating at 5/10 currently, requesting pain medication if any is ordered for him at this time. No acute distress noted. Support and encouragement provided. Routine safety checks conducted per facility protocol. Encouraged patient to notify staff if any thoughts of harm towards self or others arise. Patient verbalizes understanding and agreement.

## 2024-11-04 NOTE — ED Notes (Signed)
 Patient resting in lounger. Calm, collected, no physical complaints at this time. Patient in no apparent acute distress. No inappropriate behaviors observed or reported at this time. Environment secured. Safety checks in place per facility protocol.

## 2024-11-04 NOTE — Progress Notes (Unsigned)
 BH MD/PA/NP OP Progress Note  11/04/2024 3:47 PM Randall Parsons  MRN:  [id number]  Visit Diagnosis:  No diagnosis found.   Assessment:   Randall Parsons presents for follow-up evaluation. Today, 11/04/24, patient reports feeling depressed associated with decreased interest in daily activities, reduced concentration, reduced appetite, disturbed sleep, decreased energy and recurrent suicidal ideations with a plan to slit his wrists using a blade that has been progressively getting worse over the past 1 year.  He has been feeling anxious, has had no panic attacks.  His symptoms are likely related to the stressors that can include his current living arrangements however he was not very forthcoming about it.  He has also been having auditory hallucinations that have progressively gotten worse since his TBI 2 years ago.  He was not a reliable historian, unable to certain on any specific reasons for depression or having suicidal ideations however kept wanting a higher level of care.  He was seen in the ER yesterday and was discharged back home.  He does have poor coping skills and limited insight into his condition.  He was recently admitted and discharged in January 9 from [facility] and was discontinued on Wellbutrin  and lamotrigine  to avoid worsening of his auditory hallucinations  He was continued on Latuda , Remeron  and Cogentin which he has been consistently taking without any side effects or any new physical concerns.  He is not using any substances including alcohol  or cigarettes and has been sober over the past 1 year from using cocaine.  There is a high suspicion that his TBI and his chronic substance use have likely exacerbated his symptoms.  Tried safety planning however he was unable to do so and endorse a plan to kill himself.  Will continue Latuda  and Remeron  at the same dose, restart Wellbutrin  for depression.  He was escorted downstairs at the urgent care for further evaluation and  management.   Risk Assessment: An assessment of suicide and violence risk factors was performed as part of this evaluation and is significantly changed from the last visit. While future psychiatric events cannot be accurately predicted, the patient currently requires acute inpatient psychiatric care and does not currently meet [place]  involuntary commitment criteria. Patient was escorted to the [facility] for care. Patient was given contact information for crisis resources, behavioral health clinic and was instructed to call 911 for emergencies.   Plan: # History of TBI Cocaine induced psychotic disorder History of bipolar disorder Past medication trials:  Status of problem: Current Interventions: -- Continue Latuda  60 mg daily with dinner  # MDD, recurrent, moderate/GAD Past medication trials:  Status of problem: Current Interventions: -- Continue Remeron  15 mg nightly for sleep -- Restart Wellbutrin  150 mg daily  # History of cocaine use Past medication trials:  Status of problem: Last Interventions: -- Encouraged continued sobriety -- Encouraged psychotherapy  Chief Complaint:  No chief complaint on file.  HPI:  Randall Parsons is a 44 year old male with a history of cocaine use, marijuana use, heroin use and fentanyl  use disorder, GAD, depression, ADHD that presented to the clinic today for a follow-up. He reported his mood as not good .  Reported that yesterday I tried to admit myself to the hospital as I have been hearing voices, having suicidal ideations with a plan to cut my wrist .  Reported that he has previously tried to commit suicide 20 years ago I cut my wrist was a slow death .  Reported that he was admitted  to the hospital and stabilized and has not hurt himself after that.  However reported a plan buying a blade and cutting his wrist.  He denied any homicidal ideations, denied any visual hallucinations.  He endorsed auditory hallucinations stating I will get  arrested, I am a child molester , stated that this started after his TBI 2 years ago voices are inside my head, not my voices .  Also reported that he was using cocaine, had worsening of the voices but has been sober for the past 1 year, denied using any substance including alcohol  or cigarettes. Also reported feeling depressed with decreased interest in daily activities, reduced appetite, decreased energy, reduced concentration, poor sleep and recurrent suicidal ideations that have been chronically present over the past 2 years.  He stated that he was in the hospital in [facility] and they discontinued his lamotrigine  and Wellbutrin  stated that it was likely worsening my symptoms , stated that it made my depression worse . He reported that he is living at the [facility] house I do not feel safe.,  Stated that he is working with his case manager to get to a higher level of care . Stated that he has been taking his medications as prescribed, denied any physical concerns.  He denied any side effects to medications.  He was not able to safety plan, scored 24 on PHQ-9 and was escorted down to the urgent care for management.   Past Psychiatric History:  polysubstance use (cocaine, heroin, marijuana, fentanyl ), anxiety, depression, ADHD (childhood- reports was on ritalin and adderall).   Past Medical History:  Past Medical History:  Diagnosis Date   Anxiety    Asthma    CHF (congestive heart failure) (HCC)    Depression    Exposure to hepatitis B    Exposure to hepatitis C    Hepatitis C    History of kidney stones    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, ending around 2017   Hypertension    IV drug abuse (HCC)    Myocardial infarction (HCC)    Renal disorder    kidney stones   Stroke [facility])     Past Surgical History:  Procedure Laterality Date   APPENDECTOMY     EYE SURGERY     secondary to dog bite   I & D EXTREMITY Right 01/31/2023   Procedure: IRRIGATION AND  DEBRIDEMENT INDEX FINGER;  Surgeon: Murrell Drivers, MD;  Location: [facility] OR;  Service: Orthopedics;  Laterality: Right;   TIBIA FRACTURE SURGERY Right     Family Psychiatric History:  Father alcohol  use, Bipolar disorder mother   Family History:  Family History  Problem Relation Age of Onset   Alcohol  abuse Father    Melanoma Mother    Breast cancer Mother    Colon cancer Neg Hx    Prostate cancer Neg Hx     Social History:  Social History   Socioeconomic History   Marital status: Married    Spouse name: Not on file   Number of children: Not on file   Years of education: Not on file   Highest education level: Not on file  Occupational History   Not on file  Tobacco Use   Smoking status: Former   Smokeless tobacco: Never  Vaping Use   Vaping status: Every Day  Substance and Sexual Activity   Alcohol  use: No   Drug use: Not Currently    Types: IV, Cocaine  Comment: hx of heroin abuse (opioid addiction), crack   Sexual activity: Not on file  Other Topics Concern   Not on file  Social History Narrative   Living with neighbor as of 2024.     Divorced.     Has 5 step kids (2 at home).  From [facility].     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     Social Drivers of Health   Tobacco Use: Medium Risk (10/09/2024)   Received from [facility]   Patient History    Smoking Tobacco Use: Former    Smokeless Tobacco Use: Former    Passive Exposure: Not on Actuary Strain: Not on file  Food Insecurity: No Food Insecurity (10/06/2024)   Epic    Worried About Programme Researcher, Broadcasting/film/video in the Last Year: Never true    Ran Out of Food in the Last Year: Never true  Recent Concern: Food Insecurity - Food Insecurity Present (07/25/2024)   Epic    Worried About Programme Researcher, Broadcasting/film/video in the Last Year: Often true    Ran Out of Food in the Last Year: Often true  Transportation Needs: No Transportation Needs (10/06/2024)   Epic    Lack of Transportation  (Medical): No    Lack of Transportation (Non-Medical): No  Physical Activity: Not on file  Stress: Not on file  Social Connections: Not on file  Depression (PHQ2-9): High Risk (11/04/2024)   Depression (PHQ2-9)    PHQ-2 Score: 14  Alcohol  Screen: Not on file  Housing: Not on file  Utilities: Not At Risk (10/06/2024)   Epic    Threatened with loss of utilities: No  Recent Concern: Utilities - At Risk (07/25/2024)   Epic    Threatened with loss of utilities: Yes  Health Literacy: Not on file    Allergies: Allergies[1]  Current Medications: No current facility-administered medications for this visit.   Current Outpatient Medications  Medication Sig Dispense Refill   benztropine (COGENTIN) 0.5 MG tablet Take 0.5 mg by mouth 2 (two) times daily.     buPROPion  (WELLBUTRIN  XL) 150 MG 24 hr tablet Take 1 tablet (150 mg total) by mouth daily. 30 tablet 1   hydrOXYzine  (ATARAX ) 50 MG tablet Take 1 tablet (50 mg total) by mouth 3 (three) times daily as needed for anxiety. (Patient not taking: Reported on 11/04/2024) 30 tablet 0   lamoTRIgine  (LAMICTAL ) 200 MG tablet Take 1 tablet (200 mg total) by mouth daily. 30 tablet 0   mirtazapine  (REMERON ) 15 MG tablet Take 1 tablet (15 mg total) by mouth at bedtime. 30 tablet 1   Multiple Vitamin (MULTIVITAMIN WITH MINERALS) TABS tablet Take 1 tablet by mouth daily.     prazosin  (MINIPRESS ) 1 MG capsule Take 1 capsule (1 mg total) by mouth at bedtime. (Patient not taking: Reported on 11/04/2024) 30 capsule 0   Facility-Administered Medications Ordered in Other Visits  Medication Dose Route Frequency Provider Last Rate Last Admin   alum & mag hydroxide-simeth (MAALOX/MYLANTA) 200-200-20 MG/5ML suspension 30 mL  30 mL Oral Q4H PRN Randall, Veronique M, NP       cloZAPine  (CLOZARIL ) tablet 25 mg  25 mg Oral Once Byungura, Veronique M, NP       [START ON 11/05/2024] cloZAPine  (CLOZARIL ) tablet 50 mg  50 mg Oral Once Byungura, Veronique M, NP       [START  ON 11/06/2024] cloZAPine  (CLOZARIL ) tablet 75 mg  75 mg Oral Once Byungura, Veronique M, NP  haloperidol  (HALDOL ) tablet 5 mg  5 mg Oral TID PRN Randall Starlyn HERO, NP       And   diphenhydrAMINE  (BENADRYL ) capsule 50 mg  50 mg Oral TID PRN Randall Starlyn HERO, NP       haloperidol  lactate (HALDOL ) injection 5 mg  5 mg Intramuscular TID PRN Randall Starlyn HERO, NP       And   diphenhydrAMINE  (BENADRYL ) injection 50 mg  50 mg Intramuscular TID PRN Randall Starlyn HERO, NP       And   LORazepam  (ATIVAN ) injection 2 mg  2 mg Intramuscular TID PRN Randall Starlyn HERO, NP       haloperidol  lactate (HALDOL ) injection 10 mg  10 mg Intramuscular TID PRN Randall Starlyn HERO, NP       And   diphenhydrAMINE  (BENADRYL ) injection 50 mg  50 mg Intramuscular TID PRN Randall Starlyn HERO, NP       And   LORazepam  (ATIVAN ) injection 2 mg  2 mg Intramuscular TID PRN Randall, Veronique M, NP       magnesium  hydroxide (MILK OF MAGNESIA) suspension 30 mL  30 mL Oral Daily PRN Randall Starlyn HERO, NP         Musculoskeletal: Strength & Muscle Tone: within normal limits Gait & Station: normal Patient leans: N/A  Psychiatric Specialty Exam: There were no vitals taken for this visit.There is no height or weight on file to calculate BMI. Review of Systems  General Appearance: Casual and Fairly Groomed  Eye Contact:  Good  Speech:  Clear and Coherent and Normal Rate  Volume:  Normal  Mood:  Euthymic  Affect:  Congruent  Thought Content: Logical   Suicidal Thoughts:  No  Homicidal Thoughts:  No  Thought Process:  Coherent  Orientation:  Full (Time, Place, and Person)    Memory: Immediate;   Good Recent;   Good  Judgment:  Fair  Insight:  Fair  Concentration:  Concentration: Good and Attention Span: Good  Recall:  not formally assessed   Fund of Knowledge: Good  Language: Good  Psychomotor Activity:  Normal  Akathisia:  No  AIMS (if indicated): not done  Assets:  Communication  Skills Desire for Improvement Financial Resources/Insurance Housing Intimacy Leisure Time Resilience  ADL's:  Intact  Cognition: WNL  Sleep:  Good   Metabolic Disorder Labs: Lab Results  Component Value Date   HGBA1C 5.9 (H) 12/17/2021   MPG 123 12/17/2021   MPG 119.76 12/03/2020   Lab Results  Component Value Date   PROLACTIN 6.8 07/24/2024   PROLACTIN 8.9 01/04/2021   Lab Results  Component Value Date   CHOL 220 (H) 10/06/2024   TRIG 126 10/06/2024   HDL 45 10/06/2024   CHOLHDL 4.9 10/06/2024   VLDL 25 10/06/2024   LDLCALC 150 (H) 10/06/2024   LDLCALC 130 (H) 07/24/2024   Lab Results  Component Value Date   TSH 2.720 11/04/2024   TSH 4.174 07/24/2024    Therapeutic Level Labs: No results found for: LITHIUM No results found for: VALPROATE No results found for: CBMZ   Screenings: AUDIT    Flowsheet Row Admission (Discharged) from [facility]/14/[facility] in [facility]IORAL MEDICINE  Alcohol  Use Disorder Identification Test Final Score (AUDIT) 21   GAD-7    Flowsheet Row Office Visit from 08/23/2024 in [facility] Office Visit from 07/22/2023 in [facility] [place] Office Visit from 07/11/2023 in [facility]  Total  GAD-7 Score 21 6 0   PHQ2-9    Flowsheet Row ED from 11/04/2024 in [facility] Clinical Support from 10/19/2024 in [facility] ED from 10/06/2024 in [facility] Office Visit from 08/23/2024 in [facility] ED from 07/25/2024 in [facility]  PHQ-2 Total Score 4 6 1 3 2   PHQ-9 Total Score 14 24 7 20 7    Flowsheet Row ED from 11/04/2024 in [facility] ED from 10/26/2024 in [facility] ED from 10/19/2024 in [facility]  C-SSRS RISK  CATEGORY Moderate Risk High Risk High Risk    Collaboration of Care: Collaboration of Care: Medication Management AEB Dr. Susen, previous psychiatric admission notes  Patient/Guardian was advised Release of Information must be obtained prior to any record release in order to collaborate their care with an outside provider. Patient/Guardian was advised if they have not already done so to contact the registration department to sign all necessary forms in order for us  to release information regarding their care.   Consent: Patient/Guardian gives verbal consent for treatment and assignment of benefits for services provided during this visit. Patient/Guardian expressed understanding and agreed to proceed.    Tiras Bianchini, MD 11/04/2024, 3:47 PM        [1]  Allergies Allergen Reactions   Amoxicillin Hives and Other (See Comments)    Did it involve swelling of the face/tongue/throat, SOB, or low BP? No Did it involve sudden or severe rash/hives, skin peeling, or any reaction on the inside of your mouth or nose? Yes Did you need to seek medical attention at a hospital or doctor's office? Yes When did it last happen?     44 yrs old If all above answers are NO, may proceed with cephalosporin use.    Latuda  [Lurasidone ] Other (See Comments)    Made me clinch my teeth   Prednisone  Other (See Comments)    irritable

## 2024-11-04 NOTE — ED Notes (Signed)
 Pt awake, pleasant and engaged. Denies SI/HI. Continues to report AH. He states he is a bit anxious and has restless legs. Will continue to monitor. Environmental check complete for safety

## 2024-11-04 NOTE — Progress Notes (Signed)
" °   11/04/24 1041  [facility] Triage Screening (Walk-ins at [facility] only)  What Is the Reason for Your Visit/Call Today? Randall Parsons 43y male presents to [facility] unaccompanied. PT was walked down by his therapist from the outpatient clinic upstairs. PT's therapist told him she believes he needs another evaluation since he is still having AVH and anxiety. PT discloses that he has been hearing negative things from an algorhythm, it is saying that he's a pedophile and the police are coming to get him after the snow and ice clears. PT claims that a woman from [facility] has placed this device into him and it can hear his thoughts and speaks to him. PT adamently believes this device exists. During triage, pt states that they're pissed off at me for telling you this and says Damn right I'm pissed off. PT also believes the security office has this listening device. PT denies SI, HI and alcohol  and substance use. PT endorses AH.  How Long Has This Been Causing You Problems? > than 6 months  Have You Recently Had Any Thoughts About Hurting Yourself? No  Are You Planning to Commit Suicide/Harm Yourself At This time? No  Have you Recently Had Thoughts About Hurting Someone Sherral? No  Are You Planning To Harm Someone At This Time? No  Physical Abuse Denies  Verbal Abuse Denies  Sexual Abuse Denies  Exploitation of patient/patient's resources Denies  Self-Neglect Denies  Are you currently experiencing any auditory, visual or other hallucinations? Yes  Have You Used Any Alcohol  or Drugs in the Past 24 Hours? No  Do you have any current medical co-morbidities that require immediate attention? No  Clinician description of patient physical appearance/behavior: calm, cooperative, having AH - repeating what the voice is saying  What Do You Feel Would Help You the Most Today? Treatment for Depression or other mood problem  Determination of Need Urgent (48 hours)  Options For Referral [facility];Outpatient Therapy   Determination of Need filed? Yes    "

## 2024-11-04 NOTE — ED Provider Notes (Addendum)
 [facility] Continuous Assessment Admission H&P  Date: 11/04/24 Patient Name: Randall Parsons MRN: [id number] Chief Complaint: I still struggle with the same thing  Diagnoses:  Final diagnoses:  Hallucinations, unspecified  Delusions (HCC)       HPI: Randall Parsons is a 13 year-ol male who presents to [facility] voluntarily, as a walk-in. He presents with increased anxiety and delusional thinking/paranoia, believing that there is a woman from [facility] who has been reading his thoughts, has been hacking his phone and other devices. He also believes that investigators are probably looking for him and he may be arrested and sent to prison.   Patient has a history of MDD, GAD insomnia and substance use, with multiple visits to this system as well as other hospitals around. His last  evaluation here was 10/26/2024 when he presented with chief complaint of depression and medications. Patient was recommended to follow up at [facility]out[facility] services for medication management and therapy. Today, he presented to the outpatient services for therapy as scheduled. However, patient did dot complete the session due to increased hallucinations  and suicidal ideations.  He admits to not using any substances, that he has remained sober for 3 months. Patient resides at the [facility] and has not concerns about returning there.   Patient is evaluated face-to-face by this provider who consulted with Dr Cole, MD. Chart is reviewed and current  medication regimen discussed.   Randall Parsons is sitting in the assessment room alone. He is casually dressed and groomed. [name] x4. Patient appears anxious and restless, frequently complaining of intense voices they are here all day, all night. States he hears the voice of a lady from [facility]  telling him he is being investigated. He reports that the lady has been hacking his phone and other devices searching for information.  He  reports that investigators are speaking to him  in relation to him dating an under age girl from online  dating app they say I masturbated to her, but I didn't, they think I am a child molester, and I may go to prison for 7 years.... Patient does admit that he has dated a girl online but I didn't know she was under age, she asked for money, and I blocked her number.  Patient reports he feels frustrated by the situation and unable to function properly. Patient states he was encouraged by the [facility] to come and get some help because they noticed there was something wrong with me. Patient denies SI, currently, but reports that he had severe SI  last week, with a plan to cut his wrist. Randall Parsons threatened to kick him out then they kept me when I told them I wasn't going to kill myself.  Patient reports experiencing increased anxiety related to his intrusive thoughts. Reports poor appetite and insomnia. States he has headaches caused by the voices.  When reviewing home medications, patient reports that none of them helps him. In fact, he reports that he stopped taking Latuda  5 days ago secondary to the side effects I probably need to be on new medications, these ones aren't helping me at all. States he feels exhausted mentally and physically.   Patient reports that his ex-wife  Randall Parsons [phone] continues to be his primary support  and continues to encourage him to seek treatment. Reports he will be able to return to the Randall Parsons once stabilized.   Based on presenting symptoms, we are recommending admission to  Observation unit. We will initiate Clozapine  to address patient psychotic symptoms and will continue to monitor.  Patient is established with [facility] outpatient services. Plan is to continue this program upon discharge. Patient is in agreement with this recommendation.    Total Time spent with patient: 1 hour  Musculoskeletal  Strength & Muscle Tone: within normal  limits Gait & Station: normal Patient leans: N/A  Psychiatric Specialty Exam  Presentation General Appearance:  Casual  Eye Contact: Fair  Speech: Clear and Coherent  Speech Volume: Normal  Handedness: Right   Mood and Affect  Mood: Depressed; Anxious  Affect: Depressed   Thought Process  Thought Processes: Coherent  Descriptions of Associations:Tangential  Orientation:Full (Time, Place and Person)  Thought Content:Paranoid Ideation; Delusions  Diagnosis of Schizophrenia or Schizoaffective disorder in past: No   Hallucinations:Hallucinations: Auditory Description of Auditory Hallucinations: I hear these voices all day, all night  Ideas of Reference:None  Suicidal Thoughts:Suicidal Thoughts: No  Homicidal Thoughts:Homicidal Thoughts: No   Sensorium  Memory: Immediate Fair; Recent Fair; Remote Fair  Judgment: Fair  Insight: Fair   Art Therapist  Concentration: Fair  Attention Span: Fair  Recall: [employer] of Knowledge: Fair  Language: Fair   Psychomotor Activity  Psychomotor Activity: Psychomotor Activity: Normal   Assets  Assets: Communication Skills; Desire for Improvement; Social Support   Sleep  Sleep: Sleep: Poor Number of Hours of Sleep: 3   Nutritional Assessment (For OBS and FBC admissions only) Has the patient had a weight loss or gain of 10 pounds or more in the last 3 months?: No Has the patient had a decrease in food intake/or appetite?: Yes Does the patient have dental problems?: No Does the patient have eating habits or behaviors that may be indicators of an eating disorder including binging or inducing vomiting?: No Has the patient recently lost weight without trying?: 0 Has the patient been eating poorly because of a decreased appetite?: 1 Malnutrition Screening Tool Score: 1    Physical Exam HENT:     Head: Normocephalic and atraumatic.     Right Ear: Tympanic membrane normal.      Left Ear: Tympanic membrane normal.     Nose: Nose normal.  Eyes:     Extraocular Movements: Extraocular movements intact.     Pupils: Pupils are equal, round, and reactive to light.  Cardiovascular:     Rate and Rhythm: Normal rate.     Pulses: Normal pulses.  Pulmonary:     Effort: Pulmonary effort is normal.  Musculoskeletal:        General: Normal range of motion.     Cervical back: Normal range of motion and neck supple.  Neurological:     General: No focal deficit present.     Mental Status: [name] to person, place, and time.    Review of Systems  Constitutional: Negative.   HENT: Negative.    Eyes: Negative.   Respiratory: Negative.    Cardiovascular: Negative.   Gastrointestinal: Negative.   Genitourinary: Negative.   Musculoskeletal: Negative.   Skin: Negative.   Neurological: Negative.   Endo/Heme/Allergies: Negative.   Psychiatric/Behavioral:  Positive for depression and hallucinations. The patient is nervous/anxious and has insomnia.     Blood pressure 129/78, pulse 65, temperature 97.8 F (36.6 C), temperature source Oral, resp. rate 17, SpO2 99%. There is no height or weight on file to calculate BMI.  Past Psychiatric History: MDD, Bipolar, PTSD, Substance use   Is the patient at  risk to self? No  Has the patient been a risk to self in the past 6 months? Yes .    Has the patient been a risk to self within the distant past? Yes   Is the patient a risk to others? No   Has the patient been a risk to others in the past 6 months? No   Has the patient been a risk to others within the distant past? No   Past Medical History: TBI  Family History: NA  Social History: Currently lives at the [facility]  Last Labs:  Admission on 10/06/2024, Discharged on 10/08/2024  Component Date Value Ref Range Status   Sodium 10/06/2024 136  135 - 145 mmol/L Final   Potassium 10/06/2024 4.4  3.5 - 5.1 mmol/L Final   Chloride 10/06/2024 99  98 - 111  mmol/L Final   CO2 10/06/2024 26  22 - 32 mmol/L Final   Glucose, Bld 10/06/2024 89  70 - 99 mg/dL Final   Glucose reference range applies only to samples taken after fasting for at least 8 hours.   BUN 10/06/2024 15  6 - 20 mg/dL Final   Creatinine, Ser 10/06/2024 1.15  0.61 - 1.24 mg/dL Final   Calcium  10/06/2024 9.7  8.9 - 10.3 mg/dL Final   Total Protein 87/68/7974 7.8  6.5 - 8.1 g/dL Final   Albumin 87/68/7974 4.6  3.5 - 5.0 g/dL Final   AST 87/68/7974 27  15 - 41 U/L Final   ALT 10/06/2024 27  0 - 44 U/L Final   Alkaline Phosphatase 10/06/2024 81  38 - 126 U/L Final   Total Bilirubin 10/06/2024 0.3  0.0 - 1.2 mg/dL Final   GFR, Estimated 10/06/2024 >60  >60 mL/min Final   Comment: (NOTE) Calculated using the CKD-EPI Creatinine Equation (2021)    Anion gap 10/06/2024 10  5 - 15 Final   Performed at [facility], [address], [place], [place] [address]   WBC 10/06/2024 5.3  4.0 - 10.5 K/uL Final   RBC 10/06/2024 5.02  4.22 - 5.81 MIL/uL Final   Hemoglobin 10/06/2024 15.1  13.0 - 17.0 g/dL Final   HCT 87/68/7974 46.2  39.0 - 52.0 % Final   MCV 10/06/2024 92.0  80.0 - 100.0 fL Final   MCH 10/06/2024 30.1  26.0 - 34.0 pg Final   MCHC 10/06/2024 32.7  30.0 - 36.0 g/dL Final   RDW 87/68/7974 13.5  11.5 - 15.5 % Final   Platelets 10/06/2024 279  150 - 400 K/uL Final   nRBC 10/06/2024 0.0  0.0 - 0.2 % Final   Neutrophils Relative % 10/06/2024 48  % Final   Neutro Abs 10/06/2024 2.6  1.7 - 7.7 K/uL Final   Lymphocytes Relative 10/06/2024 37  % Final   Lymphs Abs 10/06/2024 2.0  0.7 - 4.0 K/uL Final   Monocytes Relative 10/06/2024 10  % Final   Monocytes Absolute 10/06/2024 0.5  0.1 - 1.0 K/uL Final   Eosinophils Relative 10/06/2024 4  % Final   Eosinophils Absolute 10/06/2024 0.2  0.0 - 0.5 K/uL Final   Basophils Relative 10/06/2024 1  % Final   Basophils Absolute 10/06/2024 0.0  0.0 - 0.1 K/uL Final   Immature Granulocytes 10/06/2024 0  % Final   Abs Immature  Granulocytes 10/06/2024 0.02  0.00 - 0.07 K/uL Final   Performed at [facility], [address] [place], [place] [address]   POC Amphetamine UR 10/06/2024 None Detected  NONE DETECTED (Cut Off Level 1000 ng/mL) Final   POC Secobarbital (BAR) 10/06/2024 None Detected  NONE DETECTED (Cut Off Level 300 ng/mL) Final   POC Buprenorphine (BUP) 10/06/2024 None Detected  NONE DETECTED (Cut Off Level 10 ng/mL) Final   POC Oxazepam (BZO) 10/06/2024 None Detected  NONE DETECTED (Cut Off Level 300 ng/mL) Final   POC Cocaine UR 10/06/2024 None Detected  NONE DETECTED (Cut Off Level 300 ng/mL) Final   POC Methamphetamine UR 10/06/2024 None Detected  NONE DETECTED (Cut Off Level 1000 ng/mL) Final   POC Morphine  10/06/2024 None Detected  NONE DETECTED (Cut Off Level 300 ng/mL) Final   POC Methadone UR 10/06/2024 None Detected  NONE DETECTED (Cut Off Level 300 ng/mL) Final   POC Oxycodone  UR 10/06/2024 None Detected  NONE DETECTED (Cut Off Level 100 ng/mL) Final   POC Marijuana UR 10/06/2024 None Detected  NONE DETECTED (Cut Off Level 50 ng/mL) Final   Cholesterol 10/06/2024 220 (H)  0 - 200 mg/dL Final   Comment:        ATP III CLASSIFICATION:  <200     mg/dL   Desirable  799-760  mg/dL   Borderline High  >=759    mg/dL   High           Triglycerides 10/06/2024 126  <150 mg/dL Final   HDL 87/68/7974 45  >40 mg/dL Final   Total CHOL/HDL Ratio 10/06/2024 4.9  RATIO Final   VLDL 10/06/2024 25  0 - 40 mg/dL Final   LDL Cholesterol 10/06/2024 150 (H)  0 - 99 mg/dL Final   Comment:        Total Cholesterol/HDL:CHD Risk Coronary Heart Disease Risk Table                     Men   Women  1/2 Average Risk   3.4   3.3  Average Risk       5.0   4.4  2 X Average Risk   9.6   7.1  3 X Average Risk  23.4   11.0        Use the calculated Patient Ratio above and the CHD Risk Table to determine the patient's CHD Risk.        ATP III CLASSIFICATION (LDL):  <100     mg/dL   Optimal  899-870  mg/dL   Near  or Above                    Optimal  130-159  mg/dL   Borderline  839-810  mg/dL   High  >809     mg/dL   Very High Performed at [name], [address], [place], [place] [address]   Admission on 09/17/2024, Discharged on 09/17/2024  Component Date Value Ref Range Status   Influenza A, POC 09/17/2024 Negative  Negative Final   Influenza B, POC 09/17/2024 Negative  Negative Final   SARS Coronavirus 2 by RT PCR 09/17/2024 NEGATIVE  NEGATIVE Final   Performed at [facility], [address], [place], [place] [address]  Admission on 08/27/2024, Discharged on 08/27/2024  Component Date Value Ref Range Status   WBC 08/27/2024 5.0  4.0 - 10.5 K/uL Final   RBC 08/27/2024 4.54  4.22 - 5.81 MIL/uL Final   Hemoglobin 08/27/2024 13.8  13.0 - 17.0 g/dL Final   HCT 88/78/7974 42.0  39.0 - 52.0 % Final   MCV 08/27/2024 92.5  80.0 -  100.0 fL Final   MCH 08/27/2024 30.4  26.0 - 34.0 pg Final   MCHC 08/27/2024 32.9  30.0 - 36.0 g/dL Final   RDW 88/78/7974 13.2  11.5 - 15.5 % Final   Platelets 08/27/2024 323  150 - 400 K/uL Final   nRBC 08/27/2024 0.0  0.0 - 0.2 % Final   Neutrophils Relative % 08/27/2024 51  % Final   Neutro Abs 08/27/2024 2.5  1.7 - 7.7 K/uL Final   Lymphocytes Relative 08/27/2024 35  % Final   Lymphs Abs 08/27/2024 1.7  0.7 - 4.0 K/uL Final   Monocytes Relative 08/27/2024 11  % Final   Monocytes Absolute 08/27/2024 0.6  0.1 - 1.0 K/uL Final   Eosinophils Relative 08/27/2024 2  % Final   Eosinophils Absolute 08/27/2024 0.1  0.0 - 0.5 K/uL Final   Basophils Relative 08/27/2024 1  % Final   Basophils Absolute 08/27/2024 0.1  0.0 - 0.1 K/uL Final   Immature Granulocytes 08/27/2024 0  % Final   Abs Immature Granulocytes 08/27/2024 0.02  0.00 - 0.07 K/uL Final   Performed at [facility], [address], [place], [place] [address]   Sodium 08/27/2024 136  135 - 145 mmol/L Final   Potassium 08/27/2024 4.2  3.5 - 5.1 mmol/L Final   Chloride 08/27/2024 104  98 -  111 mmol/L Final   CO2 08/27/2024 24  22 - 32 mmol/L Final   Glucose, Bld 08/27/2024 86  70 - 99 mg/dL Final   Glucose reference range applies only to samples taken after fasting for at least 8 hours.   BUN 08/27/2024 15  6 - 20 mg/dL Final   Creatinine, Ser 08/27/2024 0.95  0.61 - 1.24 mg/dL Final   Calcium  08/27/2024 9.2  8.9 - 10.3 mg/dL Final   GFR, Estimated 08/27/2024 >60  >60 mL/min Final   Comment: (NOTE) Calculated using the CKD-EPI Creatinine Equation (2021)    Anion gap 08/27/2024 8  5 - 15 Final   Performed at [facility], [address] [place], [place] [address]  Admission on 07/24/2024, Discharged on 07/25/2024  Component Date Value Ref Range Status   WBC 07/24/2024 6.2  4.0 - 10.5 K/uL Final   RBC 07/24/2024 4.70  4.22 - 5.81 MIL/uL Final   Hemoglobin 07/24/2024 14.2  13.0 - 17.0 g/dL Final   HCT 89/81/7974 43.9  39.0 - 52.0 % Final   MCV 07/24/2024 93.4  80.0 - 100.0 fL Final   MCH 07/24/2024 30.2  26.0 - 34.0 pg Final   MCHC 07/24/2024 32.3  30.0 - 36.0 g/dL Final   RDW 89/81/7974 13.7  11.5 - 15.5 % Final   Platelets 07/24/2024 360  150 - 400 K/uL Final   nRBC 07/24/2024 0.0  0.0 - 0.2 % Final   Neutrophils Relative % 07/24/2024 46  % Final   Neutro Abs 07/24/2024 2.9  1.7 - 7.7 K/uL Final   Lymphocytes Relative 07/24/2024 39  % Final   Lymphs Abs 07/24/2024 2.4  0.7 - 4.0 K/uL Final   Monocytes Relative 07/24/2024 10  % Final   Monocytes Absolute 07/24/2024 0.6  0.1 - 1.0 K/uL Final   Eosinophils Relative 07/24/2024 4  % Final   Eosinophils Absolute 07/24/2024 0.2  0.0 - 0.5 K/uL Final   Basophils Relative 07/24/2024 1  % Final   Basophils Absolute 07/24/2024 0.0  0.0 - 0.1 K/uL Final   Immature Granulocytes 07/24/2024 0  % Final   Abs Immature  Granulocytes 07/24/2024 0.02  0.00 - 0.07 K/uL Final   Performed at [facility], [address] [place], [place] [address]   Sodium 07/24/2024 139  135 - 145 mmol/L Final   Potassium 07/24/2024 3.0 (L)   3.5 - 5.1 mmol/L Final   Chloride 07/24/2024 99  98 - 111 mmol/L Final   CO2 07/24/2024 26  22 - 32 mmol/L Final   Glucose, Bld 07/24/2024 104 (H)  70 - 99 mg/dL Final   Glucose reference range applies only to samples taken after fasting for at least 8 hours.   BUN 07/24/2024 6  6 - 20 mg/dL Final   Creatinine, Ser 07/24/2024 1.09  0.61 - 1.24 mg/dL Final   Calcium  07/24/2024 9.4  8.9 - 10.3 mg/dL Final   Total Protein 89/81/7974 7.8  6.5 - 8.1 g/dL Final   Albumin 89/81/7974 4.3  3.5 - 5.0 g/dL Final   AST 89/81/7974 22  15 - 41 U/L Final   ALT 07/24/2024 24  0 - 44 U/L Final   Alkaline Phosphatase 07/24/2024 79  38 - 126 U/L Final   Total Bilirubin 07/24/2024 0.7  0.0 - 1.2 mg/dL Final   GFR, Estimated 07/24/2024 >60  >60 mL/min Final   Comment: (NOTE) Calculated using the CKD-EPI Creatinine Equation (2021)    Anion gap 07/24/2024 14  5 - 15 Final   Performed at [facility], [address], [place], [place] [address]   Magnesium  07/24/2024 2.2  1.7 - 2.4 mg/dL Final   Performed at [facility], [address] [place], [place] [address]   Alcohol , Ethyl (B) 07/24/2024 <15  <15 mg/dL Final   Comment: (NOTE) For medical purposes only. Performed at [facility], [address] [place], [place] [address]    Cholesterol 07/24/2024 191  0 - 200 mg/dL Final   Triglycerides 89/81/7974 70  <150 mg/dL Final   HDL 89/81/7974 47  >40 mg/dL Final   Total CHOL/HDL Ratio 07/24/2024 4.1  RATIO Final   VLDL 07/24/2024 14  0 - 40 mg/dL Final   LDL Cholesterol 07/24/2024 130 (H)  0 - 99 mg/dL Final   Comment:        Total Cholesterol/HDL:CHD Risk Coronary Heart Disease Risk Table                     Men   Women  1/2 Average Risk   3.4   3.3  Average Risk       5.0   4.4  2 X Average Risk   9.6   7.1  3 X Average Risk  23.4   11.0        Use the calculated Patient Ratio above and the CHD Risk Table to determine the patient's CHD Risk.        ATP III CLASSIFICATION  (LDL):  <100     mg/dL   Optimal  899-870  mg/dL   Near or Above                    Optimal  130-159  mg/dL   Borderline  839-810  mg/dL   High  >809     mg/dL   Very High Performed at [facility], [address], [place], [place] [address]    Prolactin 07/24/2024 6.8  3.9 - 22.7 ng/mL Final   Comment: (NOTE) Performed At: [facility] [address] [place], [place] [id number] Jennette  Frankey MD Ey:[phone]    POC Amphetamine UR 07/25/2024 None Detected  NONE DETECTED (Cut Off Level 1000 ng/mL) Final   POC Secobarbital (BAR) 07/25/2024 None Detected  NONE DETECTED (Cut Off Level 300 ng/mL) Final   POC Buprenorphine (BUP) 07/25/2024 None Detected  NONE DETECTED (Cut Off Level 10 ng/mL) Final   POC Oxazepam (BZO) 07/25/2024 None Detected  NONE DETECTED (Cut Off Level 300 ng/mL) Final   POC Cocaine UR 07/25/2024 None Detected  NONE DETECTED (Cut Off Level 300 ng/mL) Final   POC Methamphetamine UR 07/25/2024 None Detected  NONE DETECTED (Cut Off Level 1000 ng/mL) Final   POC Morphine  07/25/2024 None Detected  NONE DETECTED (Cut Off Level 300 ng/mL) Final   POC Methadone UR 07/25/2024 None Detected  NONE DETECTED (Cut Off Level 300 ng/mL) Final   POC Oxycodone  UR 07/25/2024 None Detected  NONE DETECTED (Cut Off Level 100 ng/mL) Final   POC Marijuana UR 07/25/2024 None Detected  NONE DETECTED (Cut Off Level 50 ng/mL) Final   TSH 07/24/2024 4.174  0.350 - 4.500 uIU/mL Final   Comment: Performed by a 3rd Generation assay with a functional sensitivity of <=0.01 uIU/mL. Performed at [facility], [address] [place], [place] [address]     Allergies: Amoxicillin, Latuda  [lurasidone ], and Prednisone   Medications:  Facility Ordered Medications  Medication   acetaminophen  (TYLENOL ) tablet 650 mg   alum & mag hydroxide-simeth (MAALOX/MYLANTA) 200-200-20 MG/5ML suspension 30 mL   magnesium  hydroxide (MILK OF MAGNESIA) suspension 30 mL   haloperidol  (HALDOL ) tablet 5  mg   And   diphenhydrAMINE  (BENADRYL ) capsule 50 mg   haloperidol  lactate (HALDOL ) injection 5 mg   And   diphenhydrAMINE  (BENADRYL ) injection 50 mg   And   LORazepam  (ATIVAN ) injection 2 mg   haloperidol  lactate (HALDOL ) injection 10 mg   And   diphenhydrAMINE  (BENADRYL ) injection 50 mg   And   LORazepam  (ATIVAN ) injection 2 mg   PTA Medications  Medication Sig   lamoTRIgine  (LAMICTAL ) 200 MG tablet Take 1 tablet (200 mg total) by mouth daily.   mirtazapine  (REMERON ) 15 MG tablet Take 1 tablet (15 mg total) by mouth at bedtime.   buPROPion  (WELLBUTRIN  XL) 150 MG 24 hr tablet Take 1 tablet (150 mg total) by mouth daily.   benztropine (COGENTIN) 0.5 MG tablet Take 0.5 mg by mouth 2 (two) times daily.   prazosin  (MINIPRESS ) 1 MG capsule Take 1 capsule (1 mg total) by mouth at bedtime. (Patient not taking: Reported on 11/04/2024)   hydrOXYzine  (ATARAX ) 50 MG tablet Take 1 tablet (50 mg total) by mouth 3 (three) times daily as needed for anxiety. (Patient not taking: Reported on 11/04/2024)      Medical Decision Making  Admission to Observation unit. Initiate treatment with Clozapine  as follow:  Clozapine  12.5 mg PO STA. If tolerated, continue with Clozapine  25 mg PO today, HS Clozapine  50 mg PO HS on 11/05/2024 Clozapine  75 mg PO HS on 11/06/2024  Agitation protocol Maalox 30 ml PO Q 4 PRN for indigestion Milk of Magnesia 30 ml PO Daily PRN for mild constipation  Labs: CBC, CMP, A1C, TSH, UA, UDS, Ethanol, Hepatic function panel, Lipid panel, magnesium   EKG    Recommendations  Based on my evaluation the patient does not appear to have an emergency medical condition.  Randall Bouquet, NP 11/04/24  12:50 PM

## 2024-11-05 MED ORDER — CLOZAPINE 25 MG PO TABS
50.0000 mg | ORAL_TABLET | Freq: Every day | ORAL | Status: DC
  Filled 2024-11-05: qty 14

## 2024-11-05 MED ORDER — CLOZAPINE 50 MG PO TABS: 50.0000 mg | ORAL_TABLET | Freq: Every day | ORAL | 0 refills | Status: DC

## 2024-11-05 NOTE — Discharge Instructions (Addendum)
 Discharge Recommendations:   Medications: Patient is to take medications as prescribed.   You were started on Clozapine  during this visit. Your current dose is Clozapine  50 mg at bedtime. Take as directed.   Mirtazapine  and prazosin  have been discontinued. Do not restart until seen by your outpatient psychiatric provider.   Continue taking Wellbutrin  and Lamictal  as prescribed.    Outpatient Follow up: Please follow up at the [facility] for ongoing management of Clozapine .    Therapy: We recommend that patient participate in individual therapy to address mental health concerns.  Safety:   The following safety precautions should be taken:   No sharp objects. This includes scissors, razors, scrapers, and putty knives.   Chemicals should be removed and locked up.   Medications should be removed and locked up.   Weapons should be removed and locked up. This includes firearms, knives and instruments that can be used to cause injury.   The patient should abstain from use of illicit substances/drugs and abuse of any medications.  If symptoms worsen or do not continue to improve or if the patient becomes actively suicidal or homicidal then it is recommended that the patient return to the closest hospital emergency department, the [facility], or call 911 for further evaluation and treatment.  National Suicide Prevention Lifeline 1-800-SUICIDE or 4701913896.  About 988 988 offers 24/7 access to trained crisis counselors who can help people experiencing mental health-related distress. People can call or text 988 or chat 988lifeline.org for themselves or if they are worried about a loved one who may need crisis support.

## 2024-11-05 NOTE — ED Notes (Signed)
 Pt sleeping at present, no distress noted.  Monitoring for safety.

## 2024-11-05 NOTE — ED Provider Notes (Signed)
 FBC/OBS ASAP Discharge Summary  Date and Time: 11/05/2024 3:22 PM  Name: Randall Parsons  MRN:  [id number]   Discharge Diagnoses:  Final diagnoses:  Hallucinations, unspecified  Delusions [facility])    Subjective: About the same   Stay Summary: Per triage on 11/04/2024, Randall Parsons 43y male presents to [facility] unaccompanied. PT was walked down by his therapist from the outpatient clinic upstairs. PT's therapist told him she believes he needs another evaluation since he is still having AVH and anxiety. PT discloses that he has been hearing negative things from an algorhythm, it is saying that he's a pedophile and the police are coming to get him after the snow and ice clears. PT claims that a woman from [facility] has placed this device into him and it can hear his thoughts and speaks to him. PT adamently believes this device exists. During triage, pt states that they're pissed off at me for telling you this and says Damn right I'Parsons pissed off. PT also believes the security office has this listening device. PT denies SI, HI and alcohol  and substance use. PT endorses AH.    Pt was admitted to continuous observation on 11/04/2024 due to increased anxiety and paranoia believing that his phone and other devices had been hacked. Pt was started on clozapine  with daily titration. Pt is seen face-to-face on the [facility] Adult treatment area. Pt is alert & oriented x 4 and engages in today's visit. Today, patient states he is about the same. He continues to endorse irritability and agitation triggered by auditory hallucinations of people saying I am a POS, saying negative things, and they are going to put me in prison. States AH has persisted for the past 6 months. He denies suicidal or homicidal ideation, intent and plan. Denies VH. He states he currently resides at the [facility] and is concerned about housing security because I have been to the ER so much and they might put me out. Pt also voices  concern about not receiving morning dose of Lamictal  and Wellbutrin . He is advised that these medications were not current on his medication dispense report. Pt provided his current pharmacy, [facility], which was contacted to verify patient medications. Per pharm tech at [place], pt receives pill pack and for the month of January lamictal , mirtazapine , prazosin , ibuprofen , hydroxyzine  and latuda  were filled. Pharm tech clarifies that wellbutrin  was in December fill but was not renewed for January. Pt is adamant that he has been receiving Wellbutrin  for the month of January. After multiple attempts made contact with [name] staff who advises that pt discontinued latuda  on his own due to it caused his teeth to clench. Staff further advise that patient has been receiving wellbutrin  which he attributes to meds from December being used.  Collaborated with attending psychiatrist, Dr Kandi Hahn regarding medications. Wellbutrin  and Lamictal  will be continued. Mirtazapine , hydroxyzine , and prazosin  will be discontinued to sedation combined with clozapine .   1411 This practitioner was notified by nursing staff that patient wanted to be discharged. Pt was reassessed. Pt states he desires to leave citing the bed is uncomfortable and is hurting my back. Pt was advised that his dose of clozapine  is currently being titrated and risks of discharging prior to continued titration. Pt affirms that he desires to discharge today. Discussed with attending psychiatrist, Dr Hahn regarding patient wanting to discharge home. Pt to discharge home on clozapine  50 mg at bedtime and will be discharged with one week supply with plan to follow  up in the [facility] outpatient clinic for ongoing management of clozapine  and other psychotropic medications.     Total Time spent with patient: 20 minutes  Past Psychiatric History: Schizophrenia (Dx 03/2024 at [facility]), MDD, Cocaine abuse, OUD Past Medical History: Hypoxic brain  injury (April,. 2024), Stroke. H/o kidney stones, Asthma Family History: Mother: breast cancer, bone cancer, melanoma.  Family Psychiatric  History: Mother: bipolar disorder, manic depressive, Father: depression, alcohol  abuse; Grandmother drank bleach and died 2 days later from SI attempt( per chart review)  Social History: Divorced; ex-wife is somewhat supportive. No children. He completed 8th grade. He is currently unemployed. Lives at [facility].  Tobacco Cessation:  N/A, patient does not currently use tobacco products  Current Medications:  Current Facility-Administered Medications  Medication Dose Route Frequency Provider Last Rate Last Admin   alum & mag hydroxide-simeth (MAALOX/MYLANTA) 200-200-20 MG/5ML suspension 30 mL  30 mL Oral Q4H PRN Randall, Veronique Parsons, Parsons       cloZAPine  (CLOZARIL ) tablet 50 mg  50 mg Oral Once Byungura, Veronique Parsons, Parsons       [START ON 11/06/2024] cloZAPine  (CLOZARIL ) tablet 75 mg  75 mg Oral Once Byungura, Veronique Parsons, Parsons       haloperidol  (HALDOL ) tablet 5 mg  5 mg Oral TID PRN Randall Parsons   5 mg at 11/04/24 2122   And   diphenhydrAMINE  (BENADRYL ) capsule 50 mg  50 mg Oral TID PRN Randall Parsons   50 mg at 11/04/24 2122   haloperidol  lactate (HALDOL ) injection 5 mg  5 mg Intramuscular TID PRN Randall Parsons       And   diphenhydrAMINE  (BENADRYL ) injection 50 mg  50 mg Intramuscular TID PRN Randall Parsons       And   LORazepam  (ATIVAN ) injection 2 mg  2 mg Intramuscular TID PRN Randall Parsons       haloperidol  lactate (HALDOL ) injection 10 mg  10 mg Intramuscular TID PRN Randall Parsons       And   diphenhydrAMINE  (BENADRYL ) injection 50 mg  50 mg Intramuscular TID PRN Randall Parsons       And   LORazepam  (ATIVAN ) injection 2 mg  2 mg Intramuscular TID PRN Randall Parsons       magnesium  hydroxide (MILK OF MAGNESIA) suspension 30 mL  30 mL Oral Daily PRN  Randall Parsons       Current Outpatient Medications  Medication Sig Dispense Refill   benztropine (COGENTIN) 0.5 MG tablet Take 0.5 mg by mouth 2 (two) times daily.     buPROPion  (WELLBUTRIN  XL) 150 MG 24 hr tablet Take 1 tablet (150 mg total) by mouth daily. 30 tablet 1   lamoTRIgine  (LAMICTAL ) 200 MG tablet Take 1 tablet (200 mg total) by mouth daily. 30 tablet 0   mirtazapine  (REMERON ) 15 MG tablet Take 1 tablet (15 mg total) by mouth at bedtime. 30 tablet 1   Multiple Vitamin (MULTIVITAMIN WITH MINERALS) TABS tablet Take 1 tablet by mouth daily.     hydrOXYzine  (ATARAX ) 50 MG tablet Take 1 tablet (50 mg total) by mouth 3 (three) times daily as needed for anxiety. (Patient not taking: Reported on 11/04/2024) 30 tablet 0   prazosin  (MINIPRESS ) 1 MG capsule Take 1 capsule (1 mg total) by mouth at bedtime. (Patient not taking: Reported on 11/04/2024) 30 capsule 0    PTA Medications:  Facility Ordered  Medications  Medication   alum & mag hydroxide-simeth (MAALOX/MYLANTA) 200-200-20 MG/5ML suspension 30 mL   magnesium  hydroxide (MILK OF MAGNESIA) suspension 30 mL   haloperidol  (HALDOL ) tablet 5 mg   And   diphenhydrAMINE  (BENADRYL ) capsule 50 mg   haloperidol  lactate (HALDOL ) injection 5 mg   And   diphenhydrAMINE  (BENADRYL ) injection 50 mg   And   LORazepam  (ATIVAN ) injection 2 mg   haloperidol  lactate (HALDOL ) injection 10 mg   And   diphenhydrAMINE  (BENADRYL ) injection 50 mg   And   LORazepam  (ATIVAN ) injection 2 mg   [COMPLETED] cloZAPine  (CLOZARIL ) tablet 12.5 mg   [COMPLETED] ibuprofen  (ADVIL ) tablet 600 mg   cloZAPine  (CLOZARIL ) tablet 50 mg   [START ON 11/06/2024] cloZAPine  (CLOZARIL ) tablet 75 mg   [COMPLETED] cloZAPine  (CLOZARIL ) tablet 25 mg   PTA Medications  Medication Sig   lamoTRIgine  (LAMICTAL ) 200 MG tablet Take 1 tablet (200 mg total) by mouth daily.   mirtazapine  (REMERON ) 15 MG tablet Take 1 tablet (15 mg total) by mouth at bedtime.   buPROPion   (WELLBUTRIN  XL) 150 MG 24 hr tablet Take 1 tablet (150 mg total) by mouth daily.   benztropine (COGENTIN) 0.5 MG tablet Take 0.5 mg by mouth 2 (two) times daily.   prazosin  (MINIPRESS ) 1 MG capsule Take 1 capsule (1 mg total) by mouth at bedtime. (Patient not taking: Reported on 11/04/2024)   hydrOXYzine  (ATARAX ) 50 MG tablet Take 1 tablet (50 mg total) by mouth 3 (three) times daily as needed for anxiety. (Patient not taking: Reported on 11/04/2024)       11/04/2024   11:52 AM 10/19/2024    8:24 AM 10/08/2024   10:30 AM  Depression screen PHQ 2/9  Decreased Interest 2 3 1   Down, Depressed, Hopeless 2 3 0  PHQ - 2 Score 4 6 1   Altered sleeping 2 3 2   Tired, decreased energy 1 3 1   Change in appetite 1 3 0  Feeling bad or failure about yourself  2 3 2   Trouble concentrating 2 3 1   Moving slowly or fidgety/restless 1 0 0  Suicidal thoughts 1 3 0  PHQ-9 Score 14 24 7   Difficult doing work/chores Very difficult  Somewhat difficult    Flowsheet Row ED from 11/04/2024 in [facility] ED from 10/26/2024 in [facility] ED from 10/19/2024 in [facility]  C-SSRS RISK CATEGORY Moderate Risk High Risk High Risk    Musculoskeletal  Strength & Muscle Tone: within normal limits Gait & Station: normal Patient leans: N/A  Psychiatric Specialty Exam  Presentation  General Appearance:  Appropriate for Environment  Eye Contact: Good  Speech: Clear and Coherent; Normal Rate  Speech Volume: Normal  Handedness: Right   Mood and Affect  Mood: Anxious  Affect: Depressed   Thought Process  Thought Processes: Coherent  Descriptions of Associations:Tangential  Orientation:Full (Time, Place and Person)  Thought Content:Paranoid Ideation; Delusions  Diagnosis of Schizophrenia or Schizoaffective disorder in past: No    Hallucinations:Hallucinations: Auditory Description of Auditory Hallucinations:  I hear these voices all day, all night  Ideas of Reference:None  Suicidal Thoughts:Suicidal Thoughts: No  Homicidal Thoughts:Homicidal Thoughts: No   Sensorium  Memory: Immediate Fair; Recent Fair; Remote Fair  Judgment: Fair  Insight: Fair   Art Therapist  Concentration: Fair  Attention Span: Fair  Recall: [employer] of Knowledge: Fair  Language: Fair   Psychomotor Activity  Psychomotor Activity: Psychomotor Activity: Normal  Assets  Assets: Manufacturing Systems Engineer; Desire for Improvement; Social Support   Sleep  Sleep: Sleep: Poor  No Safety Checks orders active in given range  Nutritional Assessment (For OBS and FBC admissions only) Has the patient had a weight loss or gain of 10 pounds or more in the last 3 months?: No Has the patient had a decrease in food intake/or appetite?: Yes Does the patient have dental problems?: No Does the patient have eating habits or behaviors that may be indicators of an eating disorder including binging or inducing vomiting?: No Has the patient recently lost weight without trying?: 0 Has the patient been eating poorly because of a decreased appetite?: 1 Malnutrition Screening Tool Score: 1    Physical Exam  Physical Exam Vitals and nursing note reviewed.  HENT:     Head: Normocephalic.     Nose: Nose normal.  Cardiovascular:     Rate and Rhythm: Normal rate.  Pulmonary:     Effort: Pulmonary effort is normal.  Musculoskeletal:        General: Normal range of motion.  Skin:    General: Skin is warm and dry.  Neurological:     Mental Status: [name] to person, place, and time.  Psychiatric:     Comments: See HPI    ROS Blood pressure 107/68, pulse 83, temperature 97.9 F (36.6 C), temperature source Oral, resp. rate 17, SpO2 97%. There is no height or weight on file to calculate BMI.  Demographic Factors:  Male, Caucasian, and Unemployed  Loss Factors: NA  Historical  Factors: Family history of mental illness or substance abuse  Risk Reduction Factors:   Living with another person, especially a relative, Positive social support, and Positive therapeutic relationship  Continued Clinical Symptoms:  Schizophrenia:   Paranoid or undifferentiated type  Cognitive Features That Contribute To Risk:  Polarized thinking    Suicide Risk:  Minimal: No identifiable suicidal ideation.  Patients presenting with no risk factors but with morbid ruminations; may be classified as minimal risk based on the severity of the depressive symptoms  Plan Of Care/Follow-up recommendations:  Activity:  as tolerated Diet:  regular  Tests:  as determined by outpatient psychiatric provider  Disposition: Discharge back to [name]  Medication Continue Clozapine  50 mg PO at bedtime with follow up at the outpatient clinic 2nd floor [facility]  Continue Wellbutrin  XL 150 mg PO daily Continue Lamictal  200 mg PO at bedtime Discontinue mirtazapine  7.5 mg PO at bedtime Discontinue prazosin  1 mg PO at bedtime Discontinue hydroxyzine  HCl 50 mg TID prn anxiety  Pt was advised to contact the outpatient clinic on Monday to schedule follow up appt. Secure chat also sent to outpatient clinic by Ava Elouise advising that patient was discharged on Clozapine  and will need follow up scheduled within the week.   Sherrell Culver, PMHNP-BC, FNP-BC  11/05/2024, 3:22 PM

## 2024-11-05 NOTE — ED Notes (Signed)
 Pt observed/assessed in recliner sleeping. RR even and unlabored, appearing in no noted distress. Environmental check complete, will continue to monitor for safety

## 2024-11-05 NOTE — ED Notes (Signed)
 Patient has been calm and cooperative with care.  NO distress and denies avh shi or plan.  Patient for potential admission to [facility] however he wants to talk to provider.  Patient did state earlier that he wanted to leave but then changed his mind and expressed he was anxious about his medication.  Provider made aware of patients request.

## 2024-11-05 NOTE — Progress Notes (Signed)
" ° °  THERAPIST PROGRESS NOTE  Session Time: 10:00am - 10:24am  Participation Level: Active  Behavioral Response: Neat Alert Anxious  Type of Therapy: Individual Therapy  Treatment Goals addressed: Treatment plan will be discussed at next session  ProgressTowards Goals: Treatment plan will be discussed at next session  Interventions: CBT, Motivational Interviewing, and Supportive  Summary: Randall Parsons is a 44 y.o. male who presents with as pleasant and without distress while being escorted from the lobby to the office. He reported feeling better and expressed gratitude for the appointment. However, a few minutes into the session, Randall Parsons's demeanor changed abruptly. He reported experiencing auditory and visual hallucinations (AVH), stating that he heard the therapist's phone speaking to him, claiming that he would be arrested for sexually assaulting his 50 year old daughter. Randall Parsons denied any such occurrence and expressed concern that the therapist's phone was recording him. In response to these developments, the therapist assessed Randall Parsons's condition and concluded that he should be evaluated by the [facility] Emergency Department. Randall Parsons agreed to this recommendation, and the therapist accompanied him downstairs for further evaluation. Randall Parsons reported no SI or HI.  Suicidal/Homicidal: No without intent/plan  Therapist Response: the therapist utilized Cognitive Behavioral Therapy (CBT) techniques to address Randall Parsons's concerns. Randall Parsons, currently residing at a [employer] [facility], is required to work during the day; however, the therapist noted his reluctance about this responsibility, as he reported AVH. His demeanor changed abruptly, and he exhibited odd behaviors, including laughing and smiling inappropriately, which made it difficult for the therapist to discern the reality of his statements. The disturbing allegations he relayed about the voices from the phone raised significant concern for the therapist.  As a result, the therapist determined that further evaluation was necessary to address Randall Parsons's mental health needs. Randall Parsons was in agreement.  Plan: Return again in 1 weeks.  Diagnosis:   Bipolar I disorder, most recent episode depressed (HCC)  GAD (generalized anxiety disorder)  Collaboration of Care: Psychiatrist AEB Pt was having AVH and this therapist brought him to the [facility] ED  Patient/Guardian was advised Release of Information must be obtained prior to any record release in order to collaborate their care with an outside provider. Patient/Guardian was advised if they have not already done so to contact the registration department to sign all necessary forms in order for us  to release information regarding their care.   Consent: Patient/Guardian gives verbal consent for treatment and assignment of benefits for services provided during this visit. Patient/Guardian expressed understanding and agreed to proceed.   Randall Parsons L. Robynn, LCSW  11/05/2024  "

## 2024-11-09 ENCOUNTER — Telehealth (HOSPITAL_COMMUNITY): Payer: Self-pay

## 2024-11-09 NOTE — Telephone Encounter (Signed)
 cloZAPine  (CLOZARIL ) 50 MG tablet 50 mg, Daily at bedtime         Summary:Take 1 tablet (50 mg total) by mouth at bedtime., Starting Sat 11/06/2024, Sample Dose, Route, Frequency:50 mg, Oral, Daily at bedtimeStart:01/31/2026Ordered On:01/30/2026ReportDx Associated:Taking:Long-term:Med Note:       Change Directions:Take 1 tablet (50 mg total) by mouth at bedtime. Ordering Department:[facility] Authorized Ab:Ynadnw, Fran E, NP Dispense:7 tablet Refills:0 ordered

## 2024-11-11 ENCOUNTER — Encounter (HOSPITAL_COMMUNITY): Payer: Self-pay

## 2024-11-11 ENCOUNTER — Other Ambulatory Visit (HOSPITAL_COMMUNITY): Payer: Self-pay

## 2024-11-11 ENCOUNTER — Ambulatory Visit (INDEPENDENT_AMBULATORY_CARE_PROVIDER_SITE_OTHER): Payer: MEDICAID

## 2024-11-11 DIAGNOSIS — F3130 Bipolar disorder, current episode depressed, mild or moderate severity, unspecified: Secondary | ICD-10-CM | POA: Diagnosis not present

## 2024-11-11 DIAGNOSIS — Z79899 Other long term (current) drug therapy: Secondary | ICD-10-CM

## 2024-11-11 DIAGNOSIS — F331 Major depressive disorder, recurrent, moderate: Secondary | ICD-10-CM

## 2024-11-11 DIAGNOSIS — F411 Generalized anxiety disorder: Secondary | ICD-10-CM

## 2024-11-11 DIAGNOSIS — F22 Delusional disorders: Secondary | ICD-10-CM

## 2024-11-11 MED ORDER — CLOZAPINE 50 MG PO TABS: 50.0000 mg | ORAL_TABLET | Freq: Every day | ORAL | 0 refills | Status: AC

## 2024-11-11 NOTE — Progress Notes (Signed)
" ° °  THERAPIST PROGRESS NOTE  Session Time: 8:00am - 8:56am  Participation Level: Active  Behavioral Response: Neat Alert Euthymic  Type of Therapy: Individual Therapy  Treatment Goals addressed:   LTG: Jhonnie will improve quality of life by maintaining ongoing abstinence from all mood-altering substances (Substance Use) Disciplines:  Interdisciplinary, PROVIDER Expected end:  11/10/25 STG: Coden will identify 3 personal recovery goals (Substance Use) Disciplines:  Interdisciplinary, PROVIDER Expected end:  11/10/25 STG: Audrey will identify 3 barriers to participating in substance abuse treatment (Substance Use) Disciplines:  Interdisciplinary, PROVIDER Expected end:  11/10/25 STG: Jamesyn will complete at least 80% of assigned homework (Substance Use) Disciplines:  Interdisciplinary, PROVIDER Expected end:  11/10/25 STG: Artemio will attend at least 80% of scheduled follow-up medication management appointments (Substance Use) Disciplines:  Interdisciplinary, PROVIDER Expected end:  11/10/25  ProgressTowards Goals: Initial  Interventions: CBT, Motivational Interviewing, and Supportive  Summary: Shahiem Bedwell is a 44 y.o. male who presents with a diagnosis of Bipolar I disorder, most recent episode depressed (HCC), Generalized Anxiety Disorder (GAD), and a moderate episode of recurrent Major Depressive Disorder (HCC). He came in for a walk-in appointment today and reported feeling better than last week, noting that the prescribed medication has been effective. Keldric indicated he only had a 7-day supply of his medications and was informed about the need for weekly bloodwork. This therapist coordinated with his provider and patient access staff to address these concerns.During the session, the therapist initiated a discussion about Ty's struggles with addiction. He reported being sober but occasionally experiencing cravings. They explored potential triggers for relapse, specifically  identifying feelings of rejection as a significant factor that could lead to impulsive decisions. The therapist provided psychoeducation on addiction and offered strategies to identify warning signs of triggers. They discussed healthy coping skills and activities that cultivate positive feelings. Throughout the session, Marcellius was engaged, alert, and reported no suicidal or homicidal ideation..   Suicidal/Homicidal: No without intent/plan  Therapist Response: The therapist employed Cognitive Behavioral Therapy (CBT) techniques during the session, facilitating the processing of Octavion's thoughts and feelings. This approach included validating Yakir's experiences and emotions, allowing him to feel heard and understood. The therapist practiced active listening, which encouraged open communication and established a supportive environment.In addition to these techniques, the therapist provided psychoeducation about addiction and coping strategies. This helped Darol better understand his triggers and learn effective methods to manage cravings and prevent relapse. Overall, the therapist's response was focused on creating a safe space for exploration, validation, and skill-building to support Jojo's recovery journey.  Plan: Return again in 3 weeks.  Diagnosis:   Bipolar I disorder, most recent episode depressed (HCC)  GAD (generalized anxiety disorder)  Moderate episode of recurrent major depressive disorder (HCC)  Collaboration of Care: Other Medication mgmt for refills and blood work  Patient/Guardian was advised Release of Information must be obtained prior to any record release in order to collaborate their care with an outside provider. Patient/Guardian was advised if they have not already done so to contact the registration department to sign all necessary forms in order for us  to release information regarding their care.   Consent: Patient/Guardian gives verbal consent for treatment and assignment  of benefits for services provided during this visit. Patient/Guardian expressed understanding and agreed to proceed.   Timya Trimmer L. Robynn, LCSW  11/11/2024  "

## 2024-11-17 ENCOUNTER — Other Ambulatory Visit (HOSPITAL_COMMUNITY): Payer: MEDICAID

## 2024-11-18 ENCOUNTER — Encounter (HOSPITAL_COMMUNITY): Payer: MEDICAID

## 2024-11-24 ENCOUNTER — Other Ambulatory Visit (HOSPITAL_COMMUNITY): Payer: MEDICAID

## 2024-11-25 ENCOUNTER — Encounter (HOSPITAL_COMMUNITY): Payer: MEDICAID

## 2024-12-01 ENCOUNTER — Other Ambulatory Visit (HOSPITAL_COMMUNITY): Payer: MEDICAID

## 2024-12-02 ENCOUNTER — Ambulatory Visit (HOSPITAL_COMMUNITY): Payer: MEDICAID

## 2024-12-07 ENCOUNTER — Ambulatory Visit (HOSPITAL_COMMUNITY): Payer: MEDICAID

## 2024-12-08 ENCOUNTER — Other Ambulatory Visit (HOSPITAL_COMMUNITY): Payer: MEDICAID

## 2024-12-14 ENCOUNTER — Ambulatory Visit (HOSPITAL_COMMUNITY): Payer: MEDICAID

## 2024-12-21 ENCOUNTER — Ambulatory Visit (HOSPITAL_COMMUNITY): Payer: MEDICAID

## 2024-12-30 ENCOUNTER — Ambulatory Visit (HOSPITAL_COMMUNITY): Payer: MEDICAID
